# Patient Record
Sex: Male | Born: 1940 | Race: White | Hispanic: No | Marital: Married | Smoking: Former smoker
Health system: Southern US, Community
[De-identification: ages and names within clinical notes are randomized; demographics above are authoritative.]

## PROBLEM LIST (undated history)

## (undated) DIAGNOSIS — M1990 Unspecified osteoarthritis, unspecified site: Secondary | ICD-10-CM

## (undated) DIAGNOSIS — Z8709 Personal history of other diseases of the respiratory system: Secondary | ICD-10-CM

## (undated) DIAGNOSIS — K219 Gastro-esophageal reflux disease without esophagitis: Secondary | ICD-10-CM

## (undated) DIAGNOSIS — D649 Anemia, unspecified: Secondary | ICD-10-CM

## (undated) DIAGNOSIS — I70219 Atherosclerosis of native arteries of extremities with intermittent claudication, unspecified extremity: Secondary | ICD-10-CM

## (undated) DIAGNOSIS — J449 Chronic obstructive pulmonary disease, unspecified: Secondary | ICD-10-CM

## (undated) DIAGNOSIS — I10 Essential (primary) hypertension: Secondary | ICD-10-CM

## (undated) DIAGNOSIS — C4490 Unspecified malignant neoplasm of skin, unspecified: Secondary | ICD-10-CM

## (undated) DIAGNOSIS — M109 Gout, unspecified: Secondary | ICD-10-CM

## (undated) DIAGNOSIS — R51 Headache: Secondary | ICD-10-CM

## (undated) DIAGNOSIS — I2510 Atherosclerotic heart disease of native coronary artery without angina pectoris: Secondary | ICD-10-CM

## (undated) DIAGNOSIS — IMO0001 Reserved for inherently not codable concepts without codable children: Secondary | ICD-10-CM

## (undated) DIAGNOSIS — Z8719 Personal history of other diseases of the digestive system: Secondary | ICD-10-CM

## (undated) DIAGNOSIS — I219 Acute myocardial infarction, unspecified: Secondary | ICD-10-CM

## (undated) DIAGNOSIS — R2681 Unsteadiness on feet: Secondary | ICD-10-CM

## (undated) DIAGNOSIS — C679 Malignant neoplasm of bladder, unspecified: Secondary | ICD-10-CM

## (undated) DIAGNOSIS — M545 Low back pain: Secondary | ICD-10-CM

## (undated) DIAGNOSIS — I5042 Chronic combined systolic (congestive) and diastolic (congestive) heart failure: Secondary | ICD-10-CM

## (undated) DIAGNOSIS — Z8739 Personal history of other diseases of the musculoskeletal system and connective tissue: Secondary | ICD-10-CM

## (undated) DIAGNOSIS — I471 Supraventricular tachycardia, unspecified: Secondary | ICD-10-CM

## (undated) DIAGNOSIS — I499 Cardiac arrhythmia, unspecified: Secondary | ICD-10-CM

## (undated) DIAGNOSIS — M353 Polymyalgia rheumatica: Secondary | ICD-10-CM

## (undated) DIAGNOSIS — Z951 Presence of aortocoronary bypass graft: Secondary | ICD-10-CM

## (undated) DIAGNOSIS — R519 Headache, unspecified: Secondary | ICD-10-CM

## (undated) DIAGNOSIS — E119 Type 2 diabetes mellitus without complications: Secondary | ICD-10-CM

## (undated) DIAGNOSIS — K222 Esophageal obstruction: Secondary | ICD-10-CM

## (undated) DIAGNOSIS — Z789 Other specified health status: Secondary | ICD-10-CM

## (undated) DIAGNOSIS — E785 Hyperlipidemia, unspecified: Secondary | ICD-10-CM

## (undated) DIAGNOSIS — N312 Flaccid neuropathic bladder, not elsewhere classified: Secondary | ICD-10-CM

## (undated) DIAGNOSIS — N183 Chronic kidney disease, stage 3 unspecified: Secondary | ICD-10-CM

## (undated) DIAGNOSIS — G8929 Other chronic pain: Secondary | ICD-10-CM

## (undated) DIAGNOSIS — I209 Angina pectoris, unspecified: Secondary | ICD-10-CM

## (undated) DIAGNOSIS — G4733 Obstructive sleep apnea (adult) (pediatric): Secondary | ICD-10-CM

## (undated) DIAGNOSIS — I4891 Unspecified atrial fibrillation: Secondary | ICD-10-CM

## (undated) DIAGNOSIS — I6529 Occlusion and stenosis of unspecified carotid artery: Secondary | ICD-10-CM

## (undated) HISTORY — DX: Chronic combined systolic (congestive) and diastolic (congestive) heart failure: I50.42

## (undated) HISTORY — DX: Occlusion and stenosis of unspecified carotid artery: I65.29

## (undated) HISTORY — DX: Polymyalgia rheumatica: M35.3

## (undated) HISTORY — PX: MOHS SURGERY: SUR867

## (undated) HISTORY — DX: Unspecified atrial fibrillation: I48.91

## (undated) HISTORY — PX: TONSILLECTOMY: SUR1361

## (undated) HISTORY — PX: SKIN CANCER EXCISION: SHX779

## (undated) HISTORY — PX: MULTIPLE TOOTH EXTRACTIONS: SHX2053

## (undated) HISTORY — DX: Essential (primary) hypertension: I10

## (undated) HISTORY — PX: EYE SURGERY: SHX253

## (undated) HISTORY — DX: Chronic kidney disease, stage 3 unspecified: N18.30

## (undated) HISTORY — DX: Hyperlipidemia, unspecified: E78.5

## (undated) HISTORY — DX: Obstructive sleep apnea (adult) (pediatric): G47.33

## (undated) HISTORY — PX: FRACTURE SURGERY: SHX138

## (undated) HISTORY — PX: COLONOSCOPY: SHX174

## (undated) HISTORY — PX: ESOPHAGOGASTRODUODENOSCOPY (EGD) WITH ESOPHAGEAL DILATION: SHX5812

## (undated) HISTORY — PX: CORONARY STENT PLACEMENT: SHX1402

## (undated) HISTORY — PX: CARDIAC CATHETERIZATION: SHX172

## (undated) HISTORY — PX: BACK SURGERY: SHX140

## (undated) HISTORY — PX: ANTERIOR CERVICAL DECOMP/DISCECTOMY FUSION: SHX1161

## (undated) HISTORY — DX: Chronic kidney disease, stage 3 (moderate): N18.3

## (undated) HISTORY — PX: ANKLE FRACTURE SURGERY: SHX122

## (undated) HISTORY — DX: Atherosclerosis of native arteries of extremities with intermittent claudication, unspecified extremity: I70.219

## (undated) HISTORY — DX: Gastro-esophageal reflux disease without esophagitis: K21.9

---

## 1997-10-18 HISTORY — PX: CORONARY ARTERY BYPASS GRAFT: SHX141

## 1999-06-19 HISTORY — PX: CATARACT EXTRACTION W/ INTRAOCULAR LENS  IMPLANT, BILATERAL: SHX1307

## 1999-06-29 ENCOUNTER — Encounter: Payer: Self-pay | Admitting: *Deleted

## 1999-06-29 ENCOUNTER — Ambulatory Visit (HOSPITAL_COMMUNITY): Admission: RE | Admit: 1999-06-29 | Discharge: 1999-06-29 | Payer: Self-pay | Admitting: *Deleted

## 2000-04-19 ENCOUNTER — Ambulatory Visit (HOSPITAL_COMMUNITY): Admission: RE | Admit: 2000-04-19 | Discharge: 2000-04-19 | Payer: Self-pay | Admitting: Interventional Cardiology

## 2000-04-28 ENCOUNTER — Ambulatory Visit (HOSPITAL_COMMUNITY): Admission: RE | Admit: 2000-04-28 | Discharge: 2000-04-28 | Payer: Self-pay

## 2000-05-06 ENCOUNTER — Encounter: Admission: RE | Admit: 2000-05-06 | Discharge: 2000-05-06 | Payer: Self-pay | Admitting: Interventional Cardiology

## 2000-05-06 ENCOUNTER — Encounter: Payer: Self-pay | Admitting: Interventional Cardiology

## 2000-05-26 ENCOUNTER — Ambulatory Visit (HOSPITAL_COMMUNITY): Admission: RE | Admit: 2000-05-26 | Discharge: 2000-05-27 | Payer: Self-pay | Admitting: *Deleted

## 2001-09-05 ENCOUNTER — Encounter: Admission: RE | Admit: 2001-09-05 | Discharge: 2001-12-04 | Payer: Self-pay | Admitting: Family Medicine

## 2002-01-10 ENCOUNTER — Ambulatory Visit (HOSPITAL_COMMUNITY): Admission: RE | Admit: 2002-01-10 | Discharge: 2002-01-10 | Payer: Self-pay | Admitting: Family Medicine

## 2004-03-06 ENCOUNTER — Encounter: Admission: RE | Admit: 2004-03-06 | Discharge: 2004-03-06 | Payer: Self-pay | Admitting: Gastroenterology

## 2004-05-12 ENCOUNTER — Ambulatory Visit (HOSPITAL_COMMUNITY): Admission: RE | Admit: 2004-05-12 | Discharge: 2004-05-12 | Payer: Self-pay | Admitting: Family Medicine

## 2005-05-14 ENCOUNTER — Inpatient Hospital Stay (HOSPITAL_COMMUNITY): Admission: EM | Admit: 2005-05-14 | Discharge: 2005-05-21 | Payer: Self-pay | Admitting: *Deleted

## 2005-06-10 ENCOUNTER — Ambulatory Visit (HOSPITAL_COMMUNITY): Admission: RE | Admit: 2005-06-10 | Discharge: 2005-06-11 | Payer: Self-pay | Admitting: Interventional Cardiology

## 2005-07-08 ENCOUNTER — Encounter (HOSPITAL_COMMUNITY): Admission: RE | Admit: 2005-07-08 | Discharge: 2005-10-06 | Payer: Self-pay | Admitting: Interventional Cardiology

## 2005-10-07 ENCOUNTER — Encounter (HOSPITAL_COMMUNITY): Admission: RE | Admit: 2005-10-07 | Discharge: 2006-01-05 | Payer: Self-pay | Admitting: Interventional Cardiology

## 2006-01-11 ENCOUNTER — Inpatient Hospital Stay (HOSPITAL_COMMUNITY): Admission: EM | Admit: 2006-01-11 | Discharge: 2006-01-13 | Payer: Self-pay | Admitting: Emergency Medicine

## 2006-05-20 ENCOUNTER — Ambulatory Visit: Payer: Self-pay | Admitting: Internal Medicine

## 2006-10-28 ENCOUNTER — Ambulatory Visit (HOSPITAL_COMMUNITY): Admission: RE | Admit: 2006-10-28 | Discharge: 2006-10-28 | Payer: Self-pay | Admitting: Family Medicine

## 2008-04-24 ENCOUNTER — Inpatient Hospital Stay (HOSPITAL_COMMUNITY): Admission: EM | Admit: 2008-04-24 | Discharge: 2008-04-26 | Payer: Self-pay | Admitting: Emergency Medicine

## 2011-03-02 ENCOUNTER — Emergency Department (HOSPITAL_COMMUNITY): Payer: Medicare Other

## 2011-03-02 ENCOUNTER — Inpatient Hospital Stay (HOSPITAL_COMMUNITY)
Admission: EM | Admit: 2011-03-02 | Discharge: 2011-03-06 | DRG: 247 | Disposition: A | Discharge status: Home / Self Care | Payer: Medicare Other | Attending: Interventional Cardiology | Admitting: Interventional Cardiology

## 2011-03-02 DIAGNOSIS — I25810 Atherosclerosis of coronary artery bypass graft(s) without angina pectoris: Secondary | ICD-10-CM | POA: Diagnosis present

## 2011-03-02 DIAGNOSIS — I7092 Chronic total occlusion of artery of the extremities: Secondary | ICD-10-CM | POA: Diagnosis present

## 2011-03-02 DIAGNOSIS — I70209 Unspecified atherosclerosis of native arteries of extremities, unspecified extremity: Secondary | ICD-10-CM | POA: Diagnosis present

## 2011-03-02 DIAGNOSIS — I214 Non-ST elevation (NSTEMI) myocardial infarction: Principal | ICD-10-CM | POA: Diagnosis present

## 2011-03-02 DIAGNOSIS — I10 Essential (primary) hypertension: Secondary | ICD-10-CM | POA: Diagnosis present

## 2011-03-02 DIAGNOSIS — Y849 Medical procedure, unspecified as the cause of abnormal reaction of the patient, or of later complication, without mention of misadventure at the time of the procedure: Secondary | ICD-10-CM | POA: Diagnosis present

## 2011-03-02 DIAGNOSIS — E119 Type 2 diabetes mellitus without complications: Secondary | ICD-10-CM | POA: Diagnosis present

## 2011-03-02 DIAGNOSIS — E785 Hyperlipidemia, unspecified: Secondary | ICD-10-CM | POA: Diagnosis present

## 2011-03-02 DIAGNOSIS — T82897A Other specified complication of cardiac prosthetic devices, implants and grafts, initial encounter: Secondary | ICD-10-CM | POA: Diagnosis present

## 2011-03-02 LAB — GLUCOSE, CAPILLARY: Glucose-Capillary: 295 mg/dL — ABNORMAL HIGH (ref 70–99)

## 2011-03-02 LAB — URINALYSIS, ROUTINE W REFLEX MICROSCOPIC
Glucose, UA: 250 mg/dL — AB
Ketones, ur: NEGATIVE mg/dL
Protein, ur: 100 mg/dL — AB
Specific Gravity, Urine: 1.017 (ref 1.005–1.030)
Urobilinogen, UA: 0.2 mg/dL (ref 0.0–1.0)
pH: 5.5 (ref 5.0–8.0)

## 2011-03-02 LAB — HEPARIN LEVEL (UNFRACTIONATED): Heparin Unfractionated: 0.36 IU/mL (ref 0.30–0.70)

## 2011-03-02 LAB — CK TOTAL AND CKMB (NOT AT ARMC)
Relative Index: INVALID (ref 0.0–2.5)
Total CK: 54 U/L (ref 7–232)

## 2011-03-02 LAB — BASIC METABOLIC PANEL
BUN: 27 mg/dL — ABNORMAL HIGH (ref 6–23)
Chloride: 102 mEq/L (ref 96–112)
Creatinine, Ser: 1.33 mg/dL (ref 0.4–1.5)
GFR calc non Af Amer: 53 mL/min — ABNORMAL LOW (ref >60)
Glucose, Bld: 308 mg/dL — ABNORMAL HIGH (ref 70–99)
Potassium: 5.8 mEq/L — ABNORMAL HIGH (ref 3.5–5.1)

## 2011-03-02 LAB — HEMOGLOBIN A1C
Hgb A1c MFr Bld: 7.3 % — ABNORMAL HIGH (ref <5.7)
Mean Plasma Glucose: 163 mg/dL — ABNORMAL HIGH (ref <117)

## 2011-03-02 LAB — COMPREHENSIVE METABOLIC PANEL
ALT: 21 U/L (ref 0–53)
Alkaline Phosphatase: 90 U/L (ref 39–117)
Chloride: 101 mEq/L (ref 96–112)
Creatinine, Ser: 1.24 mg/dL (ref 0.4–1.5)
Glucose, Bld: 289 mg/dL — ABNORMAL HIGH (ref 70–99)
Potassium: 5.6 mEq/L — ABNORMAL HIGH (ref 3.5–5.1)
Sodium: 137 mEq/L (ref 135–145)
Total Bilirubin: 0.2 mg/dL — ABNORMAL LOW (ref 0.3–1.2)
Total Protein: 7.1 g/dL (ref 6.0–8.3)

## 2011-03-02 LAB — CARDIAC PANEL(CRET KIN+CKTOT+MB+TROPI)
CK, MB: 5.1 ng/mL — ABNORMAL HIGH (ref 0.3–4.0)
CK, MB: 4.9 ng/mL — ABNORMAL HIGH (ref 0.3–4.0)
Relative Index: INVALID (ref 0.0–2.5)
Total CK: 64 U/L (ref 7–232)
Troponin I: 0.99 ng/mL (ref <0.30)
Troponin I: 0.98 ng/mL (ref <0.30)

## 2011-03-02 LAB — DIFFERENTIAL
Basophils Absolute: 0 10*3/uL (ref 0.0–0.1)
Basophils Relative: 0 % (ref 0–1)
Lymphocytes Relative: 4 % — ABNORMAL LOW (ref 12–46)
Neutrophils Relative %: 93 % — ABNORMAL HIGH (ref 43–77)

## 2011-03-02 LAB — CBC
RBC: 5.31 MIL/uL (ref 4.22–5.81)
WBC: 14.9 10*3/uL — ABNORMAL HIGH (ref 4.0–10.5)

## 2011-03-02 LAB — URINE MICROSCOPIC-ADD ON

## 2011-03-02 NOTE — H&P (Signed)
NAMEXYLER, TERPENING NO.:  [id number]   MEDICAL RECORD NO.:  [id number]           PATIENT TYPE:   LOCATION:                                 FACILITY:   PHYSICIAN:  Lyn Records, M.D.   DATE OF BIRTH:  [birth year]   DATE OF ADMISSION:  DATE OF DISCHARGE:                              HISTORY & PHYSICAL   CHIEF COMPLAINT:  Chest pain.   [name] in a 70 year old male patient with known history of coronary  artery disease.  He underwent bypass surgery in 1999 with the following  grafts:  Seventh finger up to the second and third diagonal, status vein  graft to the acute marginal RCA-PDA-LV branch, LIMA to the LAD.  He had  a history of non-ST-segment elevated myocardial infarction in 2006 and  underwent angioplasty to the saphenous vein graft to the right coronary  artery.  In August 2006, he had a drug-eluting stent placed to the  circumflex lesion.  In March 2007, he had a drug-eluting stent to the  saphenous vein graft to the PDA.  Noted at that time was 60%-70%  stenosis in the saphenous vein graft to the diagonal with a LIMA to  diagonal anastomotic site.  This was medically managed.  Now, he comes  in having chest pressure indigestion like his regular angina.  This  started around 6:30 a.m. on the day of admission.  He tried 2 sublingual  nitroglycerin, but it was felt they were old but may be helped somewhat.  He then developed more pain, came to the emergency room and was placed  on IV nitroglycerin with poor relief of chest pain.  He did complain of  left arm tingling and some nausea.  His EKG shows normal sinus rhythm  with a right bundle branch block.   PAST MEDICAL HISTORY:  1. Diabetes mellitus,  2. Hypertension.  3. Coronary artery disease.  4. Long-term medication use.  5. Dyslipidemia.  6. Hypertension.  7. Former tobacco abuse.   SOCIAL HISTORY:  Quit smoking 4 years ago.  No alcohol or illicit drug  use.   FAMILY HISTORY:  Mom with a  known history of coronary artery disease.  Dad had a pacemaker and a defibrillator.   ALLERGIES:  No known drug allergies.   MEDICATIONS:  1. Enteric-coated aspirin 325 mg a day.  2. Glipizide 5 mg a day.  3. Metformin 1000 mg b.i.d.  4. Plavix 75 mg a day once daily.  5. Sublingual nitroglycerin p.r.n.  6. Over-the-counter antacid p.r.n.   PHYSICAL EXAMINATION:  VITALS:  Blood pressure 105/49, pulse 60,  respirations 18, and temp 97.8.  HEENT:  Grossly normal.  No carotid or subclavian bruits.  No JVD or  thyromegaly.  Sclerae clear.  Conjunctiva normal.  Nares without  drainage.  CHEST:  Clear to auscultation bilaterally.  No wheezing or rhonchi.  HEART:  Regular rate and rhythm.  No rubs, gallops, or murmur.  ABDOMEN:  Soft, nontender, and nondistended.  No mass.  No bruits.  No  lower extremity edema.  No femoral bruits.  NEURO:  Alert and oriented x3.  SKIN:  Warm and dry.   LABORATORY DATA:  Hemoglobin 14.4, hematocrit 41.7, platelets 329,000,  and white count 8.2.  Protime 12.7 and INR 0.9.  Point of care markers  negative x1.  Sodium 138, potassium 4.7, BUN 17, and creatinine 1.1.   CHEST X-RAY:  No active disease.   ASSESSMENT AND PLAN:  1. Typical chest pain.  2. Known coronary artery disease with history of bypass surgery as      mentioned above.  3. Diabetes mellitus:  Hold Glucophage.  4. Hypertension.  5. Dyslipidemia.  6. Long-term medication use.   We will place the patient on IV nitroglycerin, aspirin, and continue  home medications.  To the cath lab today about 1:30 p.m. in the care of  Dr. Verdis Prime.      [name], P.A.      Lyn Records, M.D.  Electronically Signed    LB/MEDQ  D:  04/26/2008  T:  04/27/2008  Job:  [id number]   cc:   Lyn Records, M.D.

## 2011-03-02 NOTE — Cardiovascular Report (Signed)
NAMEHUMZA, TALLERICO NO.:  [id number]   MEDICAL RECORD NO.:  [id number]          PATIENT TYPE:  INP   LOCATION:  2901                         FACILITY:  [facility]   PHYSICIAN:  Lyn Records, M.D.   DATE OF BIRTH:  [birth year]   DATE OF PROCEDURE:  04/24/2008  DATE OF DISCHARGE:                            CARDIAC CATHETERIZATION   INDICATION FOR PROCEDURE:  Acute coronary syndrome.   PROCEDURE PERFORMED:  1. Left heart catheterization.  2. Coronary angiography.  3. Hemodynamic recordings.  4. Left ventriculography.  5. Bypass graft angiography including internal mammary graft      angiography.  6. Complicated percutaneous coronary intervention on saphenous vein      graft to the right coronary for severe in-stent restenosis with      thrombotic occlusion requiring filter wire, Fetch aspiration      thrombectomy, intracoronary verapamil, and restenting with drug-      eluting stents in the region of in-stent restenosis.   DESCRIPTION:  After informed consent, the patient was brought to the  cath lab in the postabsorptive state.  A 6-French sheath was placed in  the right femoral artery using a modified Seldinger technique.  A 6-  [place] A2 multipurpose catheter was used for hemodynamic recordings,  left ventriculography by hand injection, selective bypass graft  angiography, and native coronary angiography.  We used a 6-French  internal mammary catheter for left internal mammary artery angiography.   We identified the culprit for the patient's presentation to be a region  of severe in-stent restenosis with Cypher stents placed in the mid-body  of the saphenous vein graft to the right coronary in 2007.  We noted  that the stents in the distal portion of the graft had this anastomosis  with the PDA were still patent.  There appeared to be a filling defect  distal to the mid vessel stent.   We used a right coronary bypass graft 6-French catheter to obtain  guiding shots of the right coronary graft.  The patient was given a  double bolus followed by an infusion of Integrilin and weight-based  heparin at 50 units per kg.  We documented ACT to be greater than 230.  We used initially a [place] guidewire to stabilize the guide catheter  and the saphenous vein graft.  We then distally placed a FilterWire EZ  distally in the right coronary.  We used 2 of these devices.  The first  device had some difficulty traversing the vessel and the bend that I  placed on the tip prevented [place] from traversing the distal most stent in  this graft.  The second device went down relatively easily without  difficulty through the second stent and had a relatively straight wire  tip.  We deployed the filter wire and then we performed angioplasty  using a 4.0 x 20-mm long balloon within the region of restenosis.  We  then placed a 3.5 x 28-mm long and a 3.5 x 12-mm long Promus stent in  the region of restenosis completely covering the previously placed  stent.  We then postdilated with  a 4.5 mm x 20-mm long Quantum balloon  to 14 atmospheres throughout the stented region.  We then removed the  filter wire which improved flow.  We then performed aspiration and  thrombectomy using the Fetch catheter and paid particular attention to  the vessel distal to the placed stents in the mid-graft.  We were able  to aspirate multiple fragments of yellow colored material from this  region.  The Fetch was used over a Forensic psychologist guidewire.  After the  thrombectomy runs, reduced flow was still noted.  We then used a Fetch  catheter to give intracoronary verapamil in aliquots of 100-150 mcg for  a total of 1000 mcg of verapamil.  This did slightly improve flow.  We  then removed the wire and took final angiographic pictures and the case  was terminated.   RESULTS:  I:  Hemodynamic data:  a.  Aortic pressure 124/15.  b.  Left ventricular pressure 124/7 mmHg.  1. Left ventriculography:   Left ventricular size and function are      normal.  EF is 65%.  No mitral regurgitation is noted.  2. Coronary angiography:      a.     Left main coronary widely patent.      b.     Left anterior descending coronary:  Totally occluded just       proximal to the first septal perforator.      c.     Circumflex artery:  Patent.  Proximal and mid circumflex       with a small obtuse marginal arising distally.  Circumflex stent       is patent.      d.     Right coronary:  Totally occluded in the midvessel.  3. Bypass graft angiography.      a.     Saphenous vein graft to the PDA:  This graft is patent, but       contains high-grade diffuse in-stent restenosis (type 3) with       reduced flow.  Beyond the midvessel stents, there is a globular       filling defect.  The distal stent at the anastomosis of the graft       with PDA is widely patent.      b.     Saphenous vein graft sequential to the diagonal and first       obtuse marginal:  Widely patent.  4. Internal mammary graft to the LAD:  Widely patent.  5. PCI:  A 99% diffuse in-stent restenosis in the saphenous vein graft      to the PDA was reduced to less than 10% after restenting with      Promus drug-eluting stent in an overlapping fashion.  TIMI flow was      2-3 despite selective administration of verapamil into the distal      graft via the Fetch catheter.   CONCLUSION:  1. ACS secondary to late restenosis of saphenous vein graft drug-      eluting stent that supply the right coronary.  2. Patent LIMA to the LAD and saphenous vein sequential graft to the      diagonal obtuse marginal.  3. Severe native vessel coronary disease with total occlusion of the      LAD, total occlusion of the mid-right coronary.  4. Normal LV function.  5. Successful PCI on the restenotic diffuse in-stent restenosis in the      right coronary saphenous vein  graft and aspiration thrombectomy on      the distal graft beyond the region of restenosis  with a beautiful      angiographic result with less than 10% stenosis, but with      diminished flow secondary to microembolization.   PLAN:  IV Integrilin, IV nitroglycerin.  Plavix to be continued.  Heparin will be discontinued.      Lyn Records, M.D.  Electronically Signed     HWS/MEDQ  D:  04/24/2008  T:  04/25/2008  Job:  [id number]   cc:   Holley Bouche, M.D.

## 2011-03-02 NOTE — Discharge Summary (Signed)
[name] NO.:  [id number]   MEDICAL RECORD NO.:  [id number]          PATIENT TYPE:  INP   LOCATION:  2002                         FACILITY:  [facility]   PHYSICIAN:  Lyn Records, M.D.   DATE OF BIRTH:  1940-11-18   DATE OF ADMISSION:  04/24/2008  DATE OF DISCHARGE:                               DISCHARGE SUMMARY   DISCHARGE DIAGNOSES:  1. Non-ST segment elevated myocardial infarction.  2. Coronary artery disease.  3. Diabetes mellitus.  4. Hypertension.  5. Dyslipidemia.  6. Long-term medication use.   [name] is a 70 year old male patient with known coronary artery  disease.  He had bypass surgery in 1999 with the following grafts:  Saphenous vein graft to the second and third diagonal, saphenous vein  graft to the acute marginal - PDA - LV branch, LIMA to LAD.  He has had  multiple interventions since that time and in 2006 underwent angioplasty  to the saphenous vein graft to the right coronary artery.  He then had a  drug-eluting stent placed to the circumflex during the same year.  He  also had a drug-eluting stent placed to the saphenous vein graft to PDA  in March 2007.  Noted at that time was a 60-70% stenosis in the  saphenous vein graft to the diagonal between the two diagonal  anastomotic sites.  This was medically managed.   He was admitted on April 24, 2008 with substernal chest pain.  He had full  resolution of symptoms with IV nitroglycerin.   Lab work during this hospital stay showed a maximum troponin of 2.53  with CK of 174, MB fraction of 22.0.  Other lab work included a  hemoglobin of 12.8, hematocrit 37.3, white count 8.1, platelets 304,  sodium 140, potassium 4.5, BUN 11, creatinine 1.22.  LFTs normal.  Chest  x-ray, no acute findings.  EKG, normal sinus rhythm with a right bundle  branch block.   The patient then underwent cardiac catheterization and was found to have  restenosis in the saphenous vein graft to the  PDA, a drug-eluting stent  was implanted.  On April 26, 2008, the patient was felt ready for  discharge to home.  It was felt that the patient needed to remain on  Plavix full life.   DISCHARGE MEDICATIONS:  1. Plavix 75 mg a day.  2. Enteric-coated aspirin 325 mg a day.  3. Sublingual nitroglycerin p.r.n. chest pain.  4. Metformin to be resumed on April 28, 2008.  5. Glipizide 5 mg a day.  6. Exforge daily.  7. Omeprazole daily.  8. Calcium daily.  9. Vitamin D daily.   The patient is to clean cath site gently with soap and water.  Remain on  a low-sodium, heart-healthy diabetic diet.  Increase activity slowly.  No lifting over 10 pounds for 1 week.  No driving for 2 days.  He can  follow up with Dr. Katrinka Blazing Tillman Sers, nurse practitioner on May 13, 2008, at 1:40 p.m.      Guy Franco, P.A.      Lyn Records,  M.D.  Electronically Signed    LB/MEDQ  D:  04/26/2008  T:  04/26/2008  Job:  [id number]   cc:   Lyn Records, M.D.

## 2011-03-03 LAB — CBC
HCT: 38.9 % — ABNORMAL LOW (ref 39.0–52.0)
Hemoglobin: 13 g/dL (ref 13.0–17.0)
Platelets: 293 10*3/uL (ref 150–400)
RBC: 4.77 MIL/uL (ref 4.22–5.81)

## 2011-03-03 LAB — GLUCOSE, CAPILLARY
Glucose-Capillary: 290 mg/dL — ABNORMAL HIGH (ref 70–99)
Glucose-Capillary: 230 mg/dL — ABNORMAL HIGH (ref 70–99)
Glucose-Capillary: 388 mg/dL — ABNORMAL HIGH (ref 70–99)

## 2011-03-03 LAB — BASIC METABOLIC PANEL
CO2: 23 mEq/L (ref 19–32)
Calcium: 9.4 mg/dL (ref 8.4–10.5)
Creatinine, Ser: 1.21 mg/dL (ref 0.4–1.5)
GFR calc Af Amer: >60 mL/min (ref >60)
GFR calc non Af Amer: 59 mL/min — ABNORMAL LOW (ref >60)
Glucose, Bld: 318 mg/dL — ABNORMAL HIGH (ref 70–99)
Sodium: 135 mEq/L (ref 135–145)

## 2011-03-03 LAB — CARDIAC PANEL(CRET KIN+CKTOT+MB+TROPI)
Relative Index: INVALID (ref 0.0–2.5)
Total CK: 50 U/L (ref 7–232)
Troponin I: 0.93 ng/mL (ref <0.30)

## 2011-03-03 LAB — LIPID PANEL
Triglycerides: 134 mg/dL (ref <150)
VLDL: 27 mg/dL (ref 0–40)

## 2011-03-03 LAB — POTASSIUM: Potassium: 4.8 mEq/L (ref 3.5–5.1)

## 2011-03-04 LAB — CARDIAC PANEL(CRET KIN+CKTOT+MB+TROPI)
CK, MB: 52.1 ng/mL (ref 0.3–4.0)
CK, MB: 69.1 ng/mL (ref 0.3–4.0)
Relative Index: 10.1 — ABNORMAL HIGH (ref 0.0–2.5)
Relative Index: 8.2 — ABNORMAL HIGH (ref 0.0–2.5)
Total CK: 632 U/L — ABNORMAL HIGH (ref 7–232)
Total CK: 684 U/L — ABNORMAL HIGH (ref 7–232)
Troponin I: >25 ng/mL (ref <0.30)

## 2011-03-04 LAB — GLUCOSE, CAPILLARY
Glucose-Capillary: 182 mg/dL — ABNORMAL HIGH (ref 70–99)
Glucose-Capillary: 187 mg/dL — ABNORMAL HIGH (ref 70–99)

## 2011-03-04 LAB — BASIC METABOLIC PANEL
CO2: 27 mEq/L (ref 19–32)
Chloride: 102 mEq/L (ref 96–112)
Creatinine, Ser: 1.17 mg/dL (ref 0.4–1.5)
GFR calc Af Amer: >60 mL/min (ref >60)
Potassium: 4.8 mEq/L (ref 3.5–5.1)
Sodium: 138 mEq/L (ref 135–145)

## 2011-03-04 LAB — CBC
Hemoglobin: 12.9 g/dL — ABNORMAL LOW (ref 13.0–17.0)
Platelets: 320 10*3/uL (ref 150–400)
RBC: 4.67 MIL/uL (ref 4.22–5.81)
WBC: 19.8 10*3/uL — ABNORMAL HIGH (ref 4.0–10.5)

## 2011-03-05 LAB — BASIC METABOLIC PANEL WITH GFR
BUN: 27 mg/dL — ABNORMAL HIGH (ref 6–23)
CO2: 28 meq/L (ref 19–32)
Calcium: 9.4 mg/dL (ref 8.4–10.5)
Chloride: 102 meq/L (ref 96–112)
Creatinine, Ser: 1.07 mg/dL (ref 0.4–1.5)
GFR calc non Af Amer: >60 mL/min
Glucose, Bld: 157 mg/dL — ABNORMAL HIGH (ref 70–99)
Potassium: 4.1 meq/L (ref 3.5–5.1)
Sodium: 137 meq/L (ref 135–145)

## 2011-03-05 LAB — CARDIAC PANEL(CRET KIN+CKTOT+MB+TROPI)
Relative Index: 9.6 — ABNORMAL HIGH (ref 0.0–2.5)
Troponin I: 15.3 ng/mL (ref <0.30)

## 2011-03-05 LAB — GLUCOSE, CAPILLARY
Glucose-Capillary: 281 mg/dL — ABNORMAL HIGH (ref 70–99)
Glucose-Capillary: 217 mg/dL — ABNORMAL HIGH (ref 70–99)

## 2011-03-05 NOTE — Discharge Summary (Signed)
NAMESHANDON, BURLINGAME NO.:  [id number]   MEDICAL RECORD NO.:  [id number]          PATIENT TYPE:  INP   LOCATION:  3740                         FACILITY:  [facility]   PHYSICIAN:  Lyn Records, M.D.   DATE OF BIRTH:  Mar 16, 1941   DATE OF ADMISSION:  05/14/2005  DATE OF DISCHARGE:  05/21/2005                                 DISCHARGE SUMMARY   ADMITTING PHYSICIAN:  Lady Deutscher, MD.   PRIMARY CARDIOLOGIST:  Verdis Prime, MD.   PRIMARY CARE PHYSICIAN:  Holley Bouche, MD   CHIEF COMPLAINT AND REASON FOR ADMISSION:  Mr. Sava is a 70 year old  white male patient with history of CAD status post CABG in 1999, who  essentially had no cardiac symptoms until 24 hours prior to admission.  He  presented to the ER with intermittent chest pain for 24 hours.  Chest pain  started in the morning prior to admission around 8 a.m., initially sharp  with sternal pressure.  The patient reports belching all morning and after a  couple hours of his symptoms, his chest pain was totally relieved.  The  symptoms returned briefly on the evening of admission, resolved  spontaneously.  He went to bed about 9 p.m.  He woke up at midnight with  severe chest pain and called 911.  By the time EMS arrived, he was  diaphoretic, short of breath, and having nausea and vomiting.  He was given  sublingual nitroglycerin with improvement in his symptoms and by the time he  arrived to the ER, he was started on IV nitroglycerin and by the time Dr.  Reyes Ivan evaluated him, he was chest pain-free.  His physical exam was  unremarkable.  BP was 150/90, heart rate 72.  Initial cardiac enzymes showed  troponin I of 0.49, subsequently up to 0.68, myoglobin of 85, then  increasing to 111.  Chest x-ray was normal.  EKG sinus rhythm without ST  segment changes and underlying right bundle branch block.  Dr. Reyes Ivan  admitted this patient with the following diagnoses:   DIAGNOSES:  1.  Non-ST segment elevated  myocardial infarction.  2.  History of coronary artery disease and prior coronary artery bypass      grafting in 1999.  3.  Diabetes.  4.  Hypertension.  5.  Peripheral vascular disease.  6.  Gastroesophageal reflux disease.  7.  History of tobacco use.   HOSPITAL COURSE:  Problem 1. Non-ST segment elevated myocardial infarction.  The patient was admitted to the CCU, where he was started on IV heparin, IV  nitroglycerin and Integrilin with further monitoring of cardiac isoenzymes.  Troponin I peaked at 4.70 with a MB peak of 20.8.  The patient remained  chest pain-free throughout the weekend and plans were to proceed with  diagnostic coronary angiography on May 17, 2005.  The patient was taken to  the cath lab by Dr. Katrinka Blazing on that date, where he was found to have severe  diffuse disease of the native LAD, as well as native circumflex and RCA was  also severely diseased with 100% proximal occlusion.  Bypass  angiography  revealed patent sequential SVG to diagonal 1 and diagonal 2.  SVG to PDA was  very degenerated with multiple areas of greater than 90% occlusion.  At this  time, Dr. Katrinka Blazing was not sure if he would be able to treat this with PCI  alone.  LV-gram showed an EF of 55%, but inferobasilar hypokinesis.  Dr.  Katrinka Blazing planned to review the films and determine whether just medical  treatment alone was indicated versus possible PCI on the SVG to the PDA.  He  was also found to have severe native disease greater than 90% and the  circumflex after the OM1 and this supplies collaterals to the PL OM and PDA,  so there was some concern that he may need to stent the circumflex as well.  The patient was continued on heparin and Integrilin post cath.   The following morning, the patient had about 20-30 minutes of chest pressure  without dyspnea.  Vital signs were stable.  Creatinine was stable post cath  at 1.4.  Due to recurrence of chest pain and the fact that the distal vessel  is not a  reasonable CABG target, Dr. Katrinka Blazing opted to proceed with PCI of the  RCA graft noting that he was high-risk for ischemic complications secondary  to embolization and possible occlusion or extension of the __________ .  Unfortunately, this was the only choice available due to patient's recurrent  issue and multiplicity of disease areas.  This was discussed with the  patient and family and they wished to proceed.   On May 19, 2005,  the patient was scheduled to undergo PCI on RCA graft.  He did spike a temperature during the evening of 101.1.  Urine and blood  cultures were obtained and patient was empirically started on Rocephin IV.  Dr. Katrinka Blazing was not clear if fever was due to underlying infection such as  bronchitis, versus due to his MI and ischemia, versus due to contrast  reaction.   Despite having the fever, Dr. Katrinka Blazing felt the patient was at least  appropriate to undergo PCI.  He was taken to the cath lab on the afternoon  of May 19, 2005, where he had a complicated PTCA done to the SVG to the  RCA and the mid distal and distal anastomosis regions with multiple balloon  inflations to achieve expectations of appropriate flow (please refer to the  cath note for details).  Post cath, he was continued on Integrilin for 12  hours, continued back on aspirin and Plavix, as well as his prior  medications of beta-blockers, ARBs.  It is important to note that prior to  admission, the patient's Glucophage had been placed on hold and has been  held throughout the course of the hospitalization.   The patient on the morning of May 20, 2005 in the past 24 hours has had a  temperature max of 101.6.  He was continued on IV Rocephin.  Because of  additional disease of the circumflex found on the initial catheterization,  the patient's IV nitroglycerin was discontinued, but Imdur was added in and plans were to discharge patient home on May 21, 2005 if no problems.   On May 21, 2005, the  patient was without chest pain, no dyspnea, no cough,  no evidence of infection.  He had no further elevation in temperature,  although he did have rhonchi on examination.  Dr. Katrinka Blazing recommended the  patient receive an additional dose of Rocephin before discharge, but  otherwise no indication to continue antibiotics at discharge.  Chest x-ray  was repeated and showed an improved atelectasis, no active process.  Plans  were to discharge patient home on previous medications including the  addition of beta-blocker Imdur, Plavix and increased dose of aspirin and  plans to return at a later date for PCI of the circumflex.   FINAL DISCHARGE DIAGNOSES:  1.  Non-ST segment elevated myocardial infarction.  2.  Multivessel coronary artery disease with graft failure of the right      coronary artery graft status post complex percutaneous coronary      intervention.  3.  Residual disease of the native mid circumflex, repeat percutaneous      coronary intervention pending.  4.  Decreased left ventricular function with inferior basilar hypokinesis,      but with ejection fraction of 55%.  5.  Diabetes mellitus.  6.  History of tobacco dependence.  7.  Peripheral vascular disease.  8.  Prior history of coronary artery bypass grafting in 1999.  9.  Obesity.   LABORATORIES DURING HOSPITALIZATION:  Most recent BMET on August 1 showed a  sodium of 135, potassium 4.1, chloride 100, CO2 25, glucose 155, BUN 8,  creatinine 1.3.  CBC on May 20, 2005:  White count 11,900, hemoglobin  12.6, hematocrit 36.5, platelet count 233,000.  Lipid profile:  Cholesterol  134, triglycerides 222, HDL cholesterol 31, LDL cholesterol 59.  Hemoglobin  A1C at 8.2.   DISCHARGE MEDICATIONS:  1.  Diovan 160 mg daily.  2.  Prilosec 20 mg daily.  3.  Aspirin 325 mg daily.  4.  Plavix 75 mg daily.  5.  Imdur 60 mg daily.  6.  Lopressor 50 mg b.i.d.  7.  Metformin ER 500 mg two tablets b.i.d., resume May 23, 2005.    DIET:  Heart healthy diabetic precautions.   ACTIVITY RESTRICTIONS:  Increase activity slowly.  You may return to work on  May 31, 2005.   FOLLOWUP APPOINTMENTS:  He is to see Dr. Katrinka Blazing on August 11 at 10:30 a.m. to  discuss returning to the hospital for PCI on the circumflex vessel.      Allison L. Rennis Harding, N.P.      Lyn Records, M.D.  Electronically Signed    ALE/MEDQ  D:  05/21/2005  T:  05/21/2005  Job:  [id number]   cc:   Holley Bouche, M.D.  [address]  [place], [place] [address]  Fax: [phone]

## 2011-03-05 NOTE — Discharge Summary (Signed)
[name], [name] NO.:  [id number]   MEDICAL RECORD NO.:  [id number]          PATIENT TYPE:  OIB   LOCATION:  6525                         FACILITY:  [facility]   PHYSICIAN:  Lyn Records, M.D.   DATE OF BIRTH:  1940-11-24   DATE OF ADMISSION:  06/10/2005  DATE OF DISCHARGE:  06/11/2005                                 DISCHARGE SUMMARY   CONSULTATIONS:  Dr. [name] with peripheral vascular CVTS.   PRIMARY CARE PHYSICIAN:  Holley Bouche, M.D.   CHIEF COMPLAINT AND REASON FOR ADMISSION:  Mr. Helbing is a 70 year old male  recently hospitalized late July early August of this year for non-ST-segment  elevated myocardial infarction.  During that hospitalization, the patient  underwent coronary angiography including angiography of patient's bypass  graft.  He had a history of coronary artery bypass graft in 1999.  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  During the hospitalization, he underwent PCI of the RCA with  plans to return for intervention on the circumflex.   The patient was evaluated at the office by Dr. Katrinka Blazing on May 28, 2005.  He  has not had any angina since being discharged from the hospital.  He has  been complaining of being fatigued.  He had a moderate hematoma in the right  groin post cath during the hospitalization.  This is improving according to  Dr. Michaelle Copas notes. He has not had any claudication in either leg.  No  chills, no fever.  He feels that he is doing rather well considering the  circumstances.  At the visit on August 11, Dr. Katrinka Blazing had a long discussion  with the patient and they have planned to have the patient come in on  today's date to relook at the right coronary graft to determine if the stent  needs to be placed in the distal anastomosis of the region of the graft that  goes to the PDA since there is a  significant amount of recoil there.  Therefore, the patient was admitted on the above stated date for possible  intervention on the RCA as well as possible intervention on the circumflex  vessel.   ADMISSION DIAGNOSES:  1.  Recent non-ST-segment elevated myocardial infarction May 14, 2005.  2.  Multivessel coronary artery disease and graft failure of the right      coronary artery graft status post percutaneous coronary intervention.  3.  Residual disease native mid circumflex.  4.  Decreased left ventricular systolic function with inferior basilar      hypokinesis.  Ejection fraction 55%.  5.  Diabetes mellitus on Glucophage.  6.  History of tobacco use.  7.  Peripheral vascular disease.  8.  Prior history of coronary artery bypass graft in 1999.   HOSPITAL COURSE:  Coronary artery disease in need of recurrent PCI.  The  patient was admitted via short stay.  He was taken to the cath lab on June 10, 2005 per Dr. Katrinka Blazing  at which time he was found to have a high grade  circumflex lesion.  A drug-eluting stent, Taxus brand, was deployed to the  circumflex vessel per Dr. Katrinka Blazing.  Please refer to the cath report for  specifics.  Angiography of the saphenous vein graft to the PDA showed that  this graft was patent, but severely diseased with recoil in the distal  graft.  The patient was successfully angio-sealed and was restarted on  aspirin and Plavix post cath.  During the procedure, the patient had been  experiencing right thigh pain, but this resolved before the sheath was  pulled out.  On the morning of discharge, Dr. Katrinka Blazing noted several small sore  spots on the soles of the patient's right foot, and on exam he was noted to  have diminished pulses in both feet.  Dr. Katrinka Blazing felt that the small  ecchymotic areas on the right sole of the foot were probably embolic  lesions, and therefore Dr. [name] with CVTS was consulted.  It was determined  that the patient did have atheroembolic event  to the right foot.  Pulses  were present in the dorsalis pedis bilaterally, not in the posterior  tibialis.  Dr. [name] felt it was appropriate to discharge the patient home  with pain control management.  Recommended started narcotic pain  medicine.  The patient is to follow up with Dr. [name] in his office in four to six  weeks for ABIs.  His office will call with an appointment.   FINAL DISCHARGE DIAGNOSES:  1.  Recent non-ST-segment elevated myocardial infarction in July 2006.  2.  Previous percutaneous coronary intervention to the right coronary artery      graft last hospitalization.  3.  High grade circumflex lesion with Taxus stent implantation to the      circumflex this hospitalization.  4.  Residual disease in the saphenous vein graft to the posterior descending      artery.  5.  Microemboli to the right foot post cath.  6.  Decreased left ventricular function with inferior basilar hypokinesis,      ejection fraction 55%.  7.  Diabetes mellitus on Glucophage.  8.  Peripheral vascular disease as stated above.  9.  History of tobacco dependence.  10. Prior history of coronary artery bypass grafting in 1999.   DISCHARGE MEDICATIONS:  1.  Vicodin 5/500 one to two tablets every six hours as needed for pain.  2.  Diovan 160 mg daily.  3.  Prilosec 20 mg daily.  4.  Aspirin 325 mg daily.  5.  Plavix 75 mg daily.  6.  Imdur 60 mg daily.  7.  Lopressor 50 mg b.i.d.  8.  Glipizide 5 mg every morning.  9.  Simvastatin 40 mg at bedtime.  10. Metformin 500 mg tablets two tabs b.i.d.   The patient has been instructed not to take this medication until June 13, 2005.   DIET:  Heart healthy with diabetic restrictions.   ACTIVITY:  Increase activity slowly.  May shower only next two days.  No  lifting more than 10 pounds for the next two days.   WOUND CARE:  Call if any bruising or swelling in the right groin.  OTHER INSTRUCTIONS:  Need to notify Dr. Florina Ou office if increased  pain in  the right foot or leg area.   FOLLOWUP APPOINTMENTS:  He is to see Dr. Katrinka Blazing on Monday, September 11 at  2:30 p.m.  He is to follow up  with Dr. [name] in his office in four to six  weeks.  Dr. Florina Ou office will call the patient with an appointment time.      Allison L. Rennis Harding, N.P.      Lyn Records, M.D.  Electronically Signed    ALE/MEDQ  D:  06/11/2005  T:  06/11/2005  Job:  [id number]   cc:   Balinda Quails, M.D.  [address]  [place]  [place] [address]   Holley Bouche, M.D.  [address]  [place], [place] [address]  Fax: [phone]

## 2011-03-05 NOTE — Discharge Summary (Signed)
[facility]  Patient:    [name], [name]                     MRN: [id number] Adm. Date:  [id number] Disc. Date: [id number] Attending:  Melvenia Needles Dictator:   Maple Mirza, P.A.                           Discharge Summary  DATE OF BIRTH:  04/09/1941  FINAL DIAGNOSIS:  Bilateral lower extremity claudication.  PROCEDURE:  Aortogram with bilateral runoff with stenting of the left external iliac artery and percutaneous transluminal angioplasty, no complications.  He will go home on Plavix and aspirin for a 30-day period and then switch totally to aspirin.  FOLLOWUP:  He will have a 30-day followup with Dr. [name].   Dr. [name] office will call to make that appointment.  DISCHARGE MEDICATIONS: 1. Enteric-coated aspirin 81 mg daily for 30 days. 2. Plavix 75 mg daily for 30 days, switched to enteric-coated aspirin    325 mg daily on September 10. 3. Resume Prevacid as before this procedure.  ACTIVITY:  Ambulation as tolerated.  DIET:  Low-sodium, low-cholesterol diet.  HOSPITAL COURSE:  He tolerated the surgical procedure without evidence of hematoma or excessive pain in the groin.  He is ambulating after his enforced rest period and should do well postoperatively. DD:  05/26/00 TD:  05/28/00 Job: [id number]

## 2011-03-05 NOTE — Discharge Summary (Signed)
NAMEOLAMIDE, CARATTINI NO.:  [id number]   MEDICAL RECORD NO.:  [id number]          PATIENT TYPE:  INP   LOCATION:  2002                         FACILITY:  [facility]   PHYSICIAN:  Guy Franco, P.A.       DATE OF BIRTH:  Nov 21, 1940   DATE OF ADMISSION:  04/24/2008  DATE OF DISCHARGE:  04/26/2008                               DISCHARGE SUMMARY   ADDENDUM   There is a question of whether or not the patient is on a beta-blocker.  He did not come in on beta-blocker, and for these reasons, it is unclear  to me why this will need to be reviewed in his office chart.  Of note,  however, while he was in the hospital, he did have bradycardia with  heart rates as low as 54, and therefore, beta-blocker was not started.      Guy Franco, P.A.     LB/MEDQ  D:  04/30/2008  T:  05/01/2008  Job:  [id number]

## 2011-03-05 NOTE — Discharge Summary (Signed)
[facility]  Patient:    [name], [name]                     MRN: [id number] Adm. Date:  [id number] Disc. Date: [id number] Attending:  Melvenia Needles Dictator:   Janeece Riggers, P.A.                           Discharge Summary  ADDENDUM  DATE OF BIRTH:  1941/09/27  On the day of procedure, after the procedure was finished, after the left external iliac artery had angioplasty and stenting, ankle-brachial indices were obtained; on the left, they were 0.41.  Prior to this procedure on July 2, they were 0.37.  On the right, they were 0.42 on August 9.  Prior to the procedure, they were 0.53 on July 2. DD:  05/26/00 TD:  05/28/00 Job: [id number]

## 2011-03-05 NOTE — H&P (Signed)
NAMEPRABHAV, [name] NO.:  [id number]   MEDICAL RECORD NO.:  [id number]          PATIENT TYPE:  INP   LOCATION:  4705                         FACILITY:  [facility]   PHYSICIAN:  Ulyses Amor, MD DATE OF BIRTH:  [birth year]   DATE OF ADMISSION:  01/11/2006  DATE OF DISCHARGE:                                HISTORY & PHYSICAL   CONTINUATION:   IMPRESSION:  1.  Chest pain; rule out unstable angina.  2.  Coronary artery disease, status post myocardial infarction and coronary      artery bypass surgery in 1999.  Status post non-ST segment elevation      acute myocardial infarction in 05/2005.  Cardiac catheterization      demonstrated failure of the graft to the right coronary artery as well      as progressive native circumflex disease.  Status post percutaneous      coronary intervention of right coronary artery and circumflex.  The      latter procedure was complicated by emboli to the feet.  3.  Peripheral vascular disease.  4.  Diabetes mellitus.  5.  Hypertension.  6.  Gastroesophageal reflux.  7.  Dyslipidemia.   PLAN:  1.  Telemetry.  2.  Serial cardiac enzymes.  3.  Aspirin.  4.  Intravenous heparin.  5.  Intravenous nitroglycerin.  6.  Continue metoprolol.  7.  Further measures per Dr. Katrinka Blazing.      Ulyses Amor, MD  Electronically Signed     MSC/MEDQ  D:  01/12/2006  T:  01/12/2006  Job:  [id number]   cc:   Lyn Records, M.D.  Fax: [phone]

## 2011-03-05 NOTE — Cardiovascular Report (Signed)
[name], [name] NO.:  [id number]   MEDICAL RECORD NO.:  [id number]          PATIENT TYPE:  INP   LOCATION:  2930                         FACILITY:  [facility]   PHYSICIAN:  Lyn Records, M.D.   DATE OF BIRTH:  Nov 07, 1940   DATE OF PROCEDURE:  05/17/2005  DATE OF DISCHARGE:                              CARDIAC CATHETERIZATION   INDICATIONS FOR PROCEDURE:  Non-ST elevation myocardial infarction on May 14, 2005, in this patient with a history of coronary artery disease and  coronary artery bypass grafting in 1999.  At that time, he received a left  internal mammary graft to the distal left anterior descending.  A sequential  saphenous vein graft at the second and third diagonal branches of the left  anterior descending, a sequential saphenous vein graft to the acute  marginal, posterior descending and posterior lateral branch of the right  coronary.  The circumflex was not grafted.  Because the distal circumflex  was not felt to be graftable.  There was also not significant high-grade  disease.   PROCEDURE PERFORMED:  1.  Left heart catheterization.  2. Selective coronary angiography.  3. Left      ventriculography.  4. Saphenous vein graft angiography.  5. Left      internal mammary graft angiography.   DESCRIPTION OF PROCEDURE:  After informed consent, a 6-French sheath was  placed in the right femoral artery using the modified Seldinger technique.  A 6-French A2 multipurpose catheter was used for hemodynamic recordings,  left ventriculography by hand injections, selective left and right native  coronary angiography as well as saphenous vein graft angiography.  We used  an internal mammary catheter for a left internal mammary artery angiography.  The patient tolerated the procedure without complications.   RESULTS:  1.  Hemodynamic data:      1.  Aortic pressure 121/58.      2.  Left ventricular pressure 120/18 mmHg.  2.  Left ventriculography.  The  left ventricle is normal in size.  The      inferobasilar region is severely hypokinetic.  No mitral regurgitation.      Ejection fraction is 60%.  3.  Coronary angiography.      1.  Left main coronary.  There is distal 30% left main stenosis.      2.  Left anterior descending coronary.  The left anterior descending          coronary artery contains a high-grade obstruction and severe diffuse          disease throughout the entire mid-segment after the origin of the          first diagonal.  The second diagonal was totally occluded.  There          was mild competitive flow noted in the distal LAD near where the          mammary to the LAD is inserted into the distal LAD.  The first          diagonal is widely patent.      3.  Circumflex  artery.  The circumflex coronary artery gives origin to          two obtuse marginal branches.  The first is moderate in size diffuse          disease in the mid-segment.  The circumflex beyond the first obtuse          marginal contains a 90+ percent stenosis.  The distal circumflex          gives origin to several small obtuse marginal branches on the          inferior wall as well as the left atrial recurrent branch which          collateralizes the distal right coronary.      4.  Right coronary.  The right coronary is large but totally occluded          proximally.  After the first acute marginal branch.  4.  Bypass graft angiography.      1.  Saphenous vein graft sequential to the acute marginal branch of the          PDA and the lateral branch of the right coronary.  This graft is          severely degenerated and functionally totally occluded.  There is a          high-grade 99% stenosis in the mid-body of the graft beyond which          there is TIMI grade 2 flow.  There is a 90% distal stenosis followed          by a 99% distal anastomosis stenosis at the site of the small PDA.          The proximal and mid-vessel contains irregularities with up  to 50%          narrowing in several spots.  The continuation of the graft of the LV          branch is occluded.      2.  Saphenous vein graft sequential to the second and third diagonals.          This graft is patent.  The graft between the second and third          diagonal branches contains a 50% diffuse narrowing.  5.  LIMA to LAD.  The LIMA is widely patent.  There is some ostial narrowing      of the LIMA.  Pressure dampening occurs with engagement.  The graft      supplies the distal LAD and there was some retrograde filling of the LAD      proximal to the graft insertion site.  The graft itself is widely      patent.   CONCLUSIONS:  1.  Acute coronary syndrome due to degeneration and thrombosis of the      saphenous vein graft to the right coronary, sequential graft to the      acute marginal/PDA/LV branch.  The anastomoses to the acute marginal and      LV branch is totally occluded and there is subtotal occlusion to the PDA      with only TIMI grade 2 noted.  The mid body and the distal portion of      the graft is severely degenerated and a distal protection device is      probably not feasible.  2.  Moderate disease of the saphenous vein graft to the diagonals.  3.  Patent LIMA to  the LAD.  4.  Severe native coronary artery disease with total occlusion of the right      coronary, diffuse disease of the first obtuse marginal, high-grade      obstruction in the circumflex after the first obtuse marginal, essential      total occlusion of the entire mid-segment of the LAD and total occlusion      of the second and third diagonal branches.  5.  Mild LV dysfunction with inferior hypokinesis.   PLAN:  Review cines.  Consider stenting the native circumflex as this  applies some collaterals to the distal right coronary territory, review of  colleagues, the advisability of even attempting PCI on the saphenous vein  graft to the right.  Continue antithrombotic therapy for the  time being.  We will discuss with  the patient and his family treatment options.  It is also conceivable that  medical therapy would be the treatment of choice.  I do not believe that  repeat bypass surgery is clinically indicated at this time because the  distal targets are not really acceptable targets.      Lyn Records, M.D.  Electronically Signed     HWS/MEDQ  D:  05/17/2005  T:  05/17/2005  Job:  [id number]   cc:   Holley Bouche, M.D.  [address]  [place], [place] [address]  Fax: [phone]   [facility]   Evelene Croon, M.D.  [address]  [place]  [place] [address]  Fax: [phone]   Dr. Bud Face

## 2011-03-05 NOTE — Cardiovascular Report (Signed)
[name], [name] NO.:  [id number]  MEDICAL RECORD NO.:  [id number]           PATIENT TYPE:  I  LOCATION:  2914                         FACILITY:  [facility]  PHYSICIAN:  Corky Crafts, MDDATE OF BIRTH:  August 03, 1941  DATE OF PROCEDURE: DATE OF DISCHARGE:                           CARDIAC CATHETERIZATION   PRIMARY CARDIOLOGIST:  Verdis Prime, MD  PROCEDURES PERFORMED:  Left heart catheterization, left ventriculogram, coronary angiogram, SVG angiogram, arterial conduit angiogram, PCI of the SVG to RCA.  OPERATOR:  Corky Crafts, MD  INDICATIONS:  Non-ST-elevation MI.  PROCEDURE NARRATIVE:  The risks and benefits of cardiac cath were explained to the patient and informed consent was obtained.  He was brought to the cath lab.  He was prepped and draped in usual sterile fashion.  His right groin was infiltrated with 1% lidocaine.  A 5-French sheath was placed into the right common femoral artery using modified Seldinger technique.  The wire selectively went up the LIMA, so LIMA catheter was eventually inserted into the vessel ostium.  Digital angiography was performed in multiple projections using hand injection of contrast.  Left coronary artery angiography was then performed using a JL-4 pigtail catheter in a similar fashion.  The JR-4 catheter was used to engage the native right and both SVGs in a similar fashion.  The pigtail catheter was advanced to the ascending aorta and across the aortic valve under fluoroscopic guidance.  Power injection of contrast performed in the RAO projection to image the left ventricle.  The catheter was pulled back under continuous hemodynamic pressure monitoring.  Angiomax was then started.  The intervention was then performed.  Please see below for details.  FINDINGS:  The LIMA to LAD was widely patent.  There is a moderate lesion in the LAD just past the insertion of the LIMA.  The left main has a distal 40%  stenosis.  Left circumflex had mild diffuse disease.  There is a small OM-1 with an 80% proximal lesion.  The majority of the circumflex went into a larger OM-2 branch.  There is also an atrial branch which may have been giving distal collaterals to the RCA territory.  Left anterior descending was occluded in the mid vessel.  The first diagonal was widely patent.  The right coronary artery was occluded proximally.  The SVG to RCA had a 50-60% proximal lesion.  Then in the midvessel stent, there is hazy stenosis which may have been thrombus up to 95%. The distal SVG stent was widely patent.  The SVG to diagonal vessel was widely patent.  The left ventriculogram showed normal ventricular function with estimated ejection fraction of 60%.  HEMODYNAMIC RESULTS:  Left ventricular pressure of 179/16 with an LVEDP of 29 mmHg.  Aortic pressure 177/75 with a mean aortic pressure of 119 mmHg.  PCI narrative:  The right coronary bypass guide was used.  We tried to advance the filter wire across the mid vessel lesion and passed the stenosis in the distal vessel as well, however, it would not cross the midvessel lesion.  No reflow was noted likely from some distal embolization.  A [name]  XT wire was then used to cross.  A 3.5 x 10 cutting balloon was advanced to the in-stent restenosis which was likely making it difficult for the filter wire to advance.  The filter wire was then advanced into the distal SVG to RCA.  A 3.5 x 38 ION stent was placed across the lesion in the distal SVG to RCA.  This is an 80% focal lesion passed the prior stent placed in the mid.  We tried to post dilate this with a 4.0 balloon, however, we are unable to deliver a 4.0 postop balloon to that area.  A 3.5 x 32 stent was then advanced to the area overlapping the proximal edge of the old stent to the mid RCA. This was deployed, but not long enough to cover also the grade proximal lesion and a 3rd overlapping stent  was placed 3.5 x 12 ION stent to the proximal area in the SVG to RCA.  This entire stented areas postdilated with 4.0 throughout the procedure.  Multiple doses of intracoronary adenosine and intracoronary verapamil were given with some significant improvement in the transient no reflow.  We also made a pass with an Export catheter which was successful in removing some debris.  After the stenting was performed, significant debris was removed from the filter.  The patient did have significant chest discomfort during the procedure.  He had EKG changes consistent with acute injury.  I think it is likely from the no reflow.  There is difficulty in advancing the retrieval catheter to take the filter back out and the filter was very full of debris and was difficult to compress into the retrieval device.  There was no residual stenosis at the end of the procedure.  There appeared to be TIMI-2 flow in the vein graft despite to transient no reflow.  IMPRESSION: 1. Percutaneous coronary intervention to saphenous vein graft to right     coronary artery with 3 drug-eluting stents, no reflow and     subsequent ischemia occurred in the inferior territory. 2. Pre-percutaneous coronary intervention, there was normal left     ventricular function. 3. Left internal mammary artery to left anterior descending and     saphenous vein graft to diagonal were widely patent. 4. Increased left ventricular end-diastolic pressure, will saline lock     IV to avoid worsening any heart failure symptoms.  RECOMMENDATIONS:  We will watch in the CCU.  He will likely have an inferior MI.  Watch for symptoms of CHF.  We will treat his chest pain with narcotics.  I believe this is secondary to microvascular obstruction.  I do not think taking him back to the cath lab would be very useful.  These findings were discussed with the family and with Dr. Katrinka Blazing.  We will continue Integrilin.  We will also continue Angiomax at the    reduced rate in the hopes of helping the microcirculation.  Continue IV nitroglycerin as well. Beta blocker as tolerated.     Corky Crafts, MD     JSV/MEDQ  D:  03/03/2011  T:  03/04/2011  Job:  [id number]  Electronically Signed by Lance Muss MD on 03/05/2011 08:19:31 AM

## 2011-03-05 NOTE — Cardiovascular Report (Signed)
NAMECAYLEN, YARDLEY NO.:  [id number]   MEDICAL RECORD NO.:  [id number]          PATIENT TYPE:  OIB   LOCATION:  6525                         FACILITY:  [facility]   PHYSICIAN:  Lyn Records, M.D.   DATE OF BIRTH:  Mar 09, 1941   DATE OF PROCEDURE:  06/10/2005  DATE OF DISCHARGE:                              CARDIAC CATHETERIZATION   INDICATIONS FOR PROCEDURE:  Recent inferior myocardial infarction with  recent PCI on degenerative saphenous vein graft to the right coronary/PDA.  At the time of catheterization, the patient was documented to have high-  grade stenosis in the mid circumflex with relatively small distal vessel.  This procedure is being done to perform PCI on the circumflex and to relook  at the saphenous vein graft to the right coronary.   PROCEDURES PERFORMED:  1.  Saphenous vein graft angiography.  2.  Drug-eluting stent into the mid circumflex (Taxus).   DESCRIPTION:  After informed consent, the patient was brought to the cath  lab. He had diminished pulses in both femoral regions with bruits. There had  been a previous hematoma in the right groin but this had significantly  resolved and we therefore went ahead and attempted to use the right femoral  for access to the descending aorta. We were able to place a stent but were  never able to get a wire including steerable floppy wires, a Wholey wire, or  a J-wire into the descending aorta. We then switched to the left femoral and  where able to freely get into the distal aorta. We placed a 6-French sheath  using the modified Seldinger technique. A bolus followed by an infusion of  Integrilin was given and ACT was documented to be greater than 300 seconds.   We then performed angiography of the saphenous vein graft to the right  coronary. We contemplated intervening on the saphenous vein graft to the  right coronary but because of the diffuse nature of the vessel and the high  likelihood of multiple  stent implantations which would greatly decrease the  likelihood of long-term patency, we chose a more conservative approach. We  then turned our attention to the circumflex. We used a CLS 6-French guide  catheter 3.5 cm bend. An Asahi medium guide wire was then used to cross over  the stenosis in the midcircumflex. Predilatation was performed with the  Maverick 12 mm long x 2.5 mm diameter. We then used a BMW wire to give  additional support in the vessel as a buddy wire. We then with some  difficulty were able to cross into the midcircumflex with a Taxus 2.75 x 16  mm long stent. We deployed this stent to 11 atmospheres and postdilated with  a Maverick 2.75 x 12 mm long balloon to 14 atmospheres which was an  effective diameter of 3.0 mm. We were unable to get a Quantum balloon to  cross the proximal margin of the stent hence the Maverick being used  postdilatation. TIMI grade 3 flow was noted postprocedure with 0% stenosis  noted. By the end of the procedure, the patient began complaining  of burning  in the anterior aspect of the right thigh. This was felt to be related to  the indwelling sheath that was probably decreasing blood flow into some of  the side branches that perfused the quadriceps.   Both the left and the right femoral sheaths were sewn in place and the  patient was taken to the holding area.   CONCLUSION:  1.  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 The previously intervened upon sites were      patent but clearly they had been recoiled in the distal stenosis at the      anastomosis. There was also some recoil in the distal lesion that had      been dilated as well as the midlesion that had been dilated. There was      also diffuse midvessel disease with an area of 60-70% stenosis above any      of the region that had been intervened upon. Because of poor distal      runoff and the extensive nature of the  disease in this graft, it was      felt that further intervention was probably not reasonable because of      the potential high ischemic complication rate.  2.  Successful drug-eluting stent in the midcircumflex with reduction in      stenosis from 85% to 0% with a Taxus 16 mm x 2.75 mm drug-eluting stent.   PLAN:  Medical therapy. Discontinue bivalirudin. Continue aspirin and  Plavix. Hopeful discharge June 11, 2005. Monitor the patient's right side  discomfort. If this continues after sheath removal, may need to have  vascular surgical evaluation.      Lyn Records, M.D.  Electronically Signed     HWS/MEDQ  D:  06/10/2005  T:  06/10/2005  Job:  [id number]   cc:   Holley Bouche, M.D.  [address]  [place], [place] [address]  Fax: [phone]

## 2011-03-05 NOTE — Op Note (Signed)
[facility]  Patient:    [name], [name]                     MRN: [id number] Proc. Date: 05/26/00 Adm. Date:  [id number] Disc. Date: [id number] Attending:  Melvenia Needles CC:         [name], M.D.  Celso Sickle, M.D.  [facility]   Operative Report  SURGEON:  [name], M.D.  DIAGNOSIS:  Bilateral lower extremity claudication.  PROCEDURE: 1. Mid abdominal aortogram, bilateral lower extremity runoff arteriography. 2. Left external iliac percutaneous transluminal angioplasty with deployment    of Palmaz Corinthian PQ 184B stent.  COMPLICATIONS:  None apparent.  ACCESS:  Bilateral common femoral arteries, 7-French sheath.  CONTRAST:  215 mL Visipaque [severe generalized debilitation.]  CLINICAL NOTE:  This is a 70 year old male with progressive severe bilateral debilitative claudication.  He is brought to the [facility] at this time for diagnostic arteriography and possible intervention.  PROCEDURE NOTE:  Patient brought to the [facility] in stable condition.  Informed consent obtained.  Patient was placed in the supine position.  Both groins were prepped and draped in a sterile fashion.  Skin and subcutaneous tissue in the right groin instilled with 1% Xylocaine.  The patient was administered 2 mg of Versed and 12.5 mg Demerol intravenously.  Needle was easily introduced in the right common femoral artery.  A standard 0.035 J wire was passed through the needle into the mid abdominal aorta. Needle removed and a 5-French sheath passed through the right groin.  A pigtail catheter was passed over the guide wire and positioned in the juxtarenal aorta.  Standard AP mid abdominal aortogram obtained.  This revealed widely patent renal arteries bilaterally.  There was a widely patent accessory right inferior renal artery also noted.  The infrarenal aorta revealed mild atherosclerotic  irregularity without dominant stenosis.  The pigtail catheter was brought down to the aortic bifurcation.  An AP pelvic arteriogram was obtained.  This revealed moderate plaque throughout the course of the common iliac arteries bilaterally with calcification but no dominant stenosis in the common iliac arteries.  The pigtail catheter was then exchanged over the guide wire for an end-hole catheter which was advanced into the left common iliac artery.  Left lower extremity runoff arteriography and oblique left iliac arteriogram was obtained.  LAO and RAO projection of the left iliac system were obtained.  There was a high grade 90% stenosis of the left external iliac artery at its origin.  The left internal iliac artery was patent although moderately diseased with plaque.  Left lower extremity runoff arteriography revealed mild stenosis of the left common femoral artery. The left profunda femoris artery was widely patent.  There was severe disease in the left superficial femoral artery with multiple subtotal occlusion and complete occlusion of the left superficial femoral artery at the adductor canal.  There was reconstitution of the above-knee popliteal artery which was moderately diseased.  There was approximately a 50% stenosis of the left popliteal artery at the knee joint.  Below the knee joint the left popliteal artery was normal in caliber.  Left lower extremity tibial runoff arteriography reveals mild tibial disease with dominant runoff at the left posterior tibial artery.  A glide wire was then advanced through the end-hole catheter into the left external iliac and common femoral artery.  The end-hole catheter was advanced over the  glide wire into the left common femoral artery. An exchange was carried out for a 0.035 Wholey guide wire.  The end-hole catheter and 5-French sheath were then removed from the right groin.  A 7-French Balkan sheath was then advanced over the [facility]  wire, however, could not be advanced across the aortic bifurcation.  The end-hole catheter was reinserted and an Amplatz super stiff wire was advanced through the end-hole catheter.  Another attempt was made to advance the Balkan sheath, however, this was unsuccessful.  The Amplatz wire was then withdrawn into the abdominal aorta.  A short 7-French sheath was then placed in the right groin.  The Amplatz wire was removed.  Retrograde injections were then made through the right femoral sheath. Oblique projections of the right iliac system were obtained.  These revealed evidence of a moderate, approximately 40-50% stenosis of the right external iliac artery just beyond its origin.  This was not felt to be flow limiting.  Right lower extremity runoff arteriography was obtained.  This revealed patent right external iliac and right common femoral arteries.  Right profunda femoris artery was normal.  Multiple subtotal occlusions of the right superficial femoral artery were present and a diseased popliteal artery above the knee was evident.  The right popliteal artery then became more normal in caliber below the right knee.  Tibial runoff revealed mild tibial disease with dominant runoff right posterior tibial artery.  Attention was then placed on the left external iliac stenosis.  The skin and subcutaneous tissue in the left groin were instilled with 1% Xylocaine.  A needle was introduced in the left common femoral artery.  A 0.035 Wholey guide wire was advanced through the needle and into the abdominal aorta across the left external iliac stenosis.  A long 7-French sheath wwas advanced over the left femoral guide wire.  The dilator was removed and the sheath flushed with heparin saline solution.  The patient was administered 3000 units of heparin intravenously.  A ______  tape was then used to mark the course of the left iliac system.  Retrograde hand injection of contrast made through the  left femoral sheath to identify site of left external iliac stenosis.  A 5 x 2 Powerflex balloon was then advanced over the guide wire and positioned at the left external iliac stenosis and inflated for eight atmospheres x 30 seconds.  The balloon was then removed and repeat retrograde hand injection revealed suboptimal dilation of the left external iliac stenosis.  A Corinthian PQ 184B stent was then loaded on a 7 x 2 Powerflex balloon.  This was then advanced to the site of left external iliac stenosis.  The PQ 184B stent was deployed at eight atmospheres for 30 seconds.  Following stent deployment, repeat retrograde hand injection through the left femoral sheath was made.  This revealed excellent position of the Corinthian stent at the origin of the left external iliac artery. No residual stenosis.  Mild distal dissection was noted which was not flow limiting.  This completed the angiogram procedure.  Guide wires and catheters were removed.  The patient was transferred to the holding area in stable condition without apparent complication.  Also noted at this time, was a diverticulum of the bladder.  These findings were reviewed with Dr. Patsi Sears, felt to be incidental and of no clinical significance.  FINAL IMPRESSION: 1. 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 2. Bilateral superficial femoral artery occlusive disease. 3. Mild tibial vessel  occlusive disease bilaterally. 4. Successful left external iliac percutaneous transluminal angioplasty and    stent deployment.  DISPOSITION:  This procedure and results have been discussed with the patient and his wife.  He will be placed on Plavix 75 and ASA 81 mg x 1 month.  he will return to the office in one month for review of his arteriogram findings and further decision regarding possible operative intervention of bilateral superficial femoral artery occlusive disease. DD:  05/26/00 TD:   05/26/00 Job: [id number]

## 2011-03-05 NOTE — Discharge Summary (Signed)
NAMEREMON, QUINTO NO.:  [id number]   MEDICAL RECORD NO.:  [id number]          PATIENT TYPE:  INP   LOCATION:  3740                         FACILITY:  [facility]   PHYSICIAN:  Lyn Records, M.D.   DATE OF BIRTH:  1941-06-25   DATE OF ADMISSION:  05/14/2005  DATE OF DISCHARGE:  05/21/2005                                 DISCHARGE SUMMARY   ADDENDUM:  This is a medication clarification. In discussing with Mr.  Staron, he mentioned that he has been placed into two [place] research  studies, one apparently for Plavix versus placebo as well as a study on  cholesterol medications, Acetamide Simvastatin combo versus Simvastatin  alone.  The patient will be receiving these medications from the research  nurse prior to discharge.      Allison L. Rennis Harding, N.P.      Lyn Records, M.D.  Electronically Signed    ALE/MEDQ  D:  05/21/2005  T:  05/21/2005  Job:  [id number]

## 2011-03-05 NOTE — Discharge Summary (Signed)
NAMESEAN, [name] NO.:  [id number]   MEDICAL RECORD NO.:  [id number]          PATIENT TYPE:  INP   LOCATION:  6524                         FACILITY:  [facility]   PHYSICIAN:  Lyn Records, M.D.   DATE OF BIRTH:  01-19-1941   DATE OF ADMISSION:  01/11/2006  DATE OF DISCHARGE:  01/13/2006                                 DISCHARGE SUMMARY   DISCHARGE DIAGNOSES:  1.  Coronary artery disease, status post drug-eluting stent to the saphenous      vein graft to the posterior descending artery on January 12, 2006.  2.  Dyslipidemia, treated.  3.  Hypertension, treated.  4.  Noninsulin-dependent diabetes mellitus.  5.  Former tobacco abuse.  6.  Multiple medication uses.   HOSPITAL COURSE:  Mr. Roza is a 70 year old male patient with a known  history of coronary artery disease, who had previously undergone coronary  artery bypass grafting.  In August 2006 he suffered a non-ST segment  elevated myocardial infarction and cardiac catheterization at that time  showed failure of the graft to the right coronary artery as well as  progressive disease of the native circumflex.  He underwent a percutaneous  intervention of the right coronary artery and soon after returned for  stenting of the circumflex.  The last procedure was complicated by emboli to  the feet.   The patient presented to [facility] on January 11, 2006, after experiencing  substernal chest pain on the evening of admission.  Cardiac enzymes were  essentially negative with a troponin of 0.08.  On discharge his creatinine  was 1.1, BUN 9, potassium 4.1.  Hemoglobin 13.8, hematocrit 40.5.   The patient underwent cardiac catheterization on January 12, 2006, and was  found to have saphenous vein graft to the PDA 99% stenosis with TIMI-1 flow.  Ultimately the patient underwent successful drug-eluting stent placement to  that area resulting in a 0% stenosis post procedure with TIMI-3 flow.   Other angiographic results  include:  LIMA to the LAD 50% ostial lesion,  otherwise patent.  Saphenous vein graft to the first and diagonal had a 60%  stenosis between the anastomoses.  Native vessel showed 30% left main, LAD  occluded at the midvessel.  Circumflex was patent at the stent site.  Right  coronary artery was chronically occluded.   The following day the patient tolerated ambulation, his vital signs were  stable, and he was ready for discharge to home.   We are discharging him home on the following medications:  1.  Enteric-coated aspirin 325 mg a day.  2.  Plavix 75 mg a day.  3.  Glipizide 5 mg a day.  4.  Imdur 60 mg a day.  5.  Metoprolol 50 twice a mg a day.  6.  Norvasc 5 mg a day.  7.  Simvastatin 40 mg a day.  8.  He is not to resume his metformin until January 16, 2006.   He is to follow up with Dr. Katrinka Blazing on January 31, 2006, at 2:45 p.m.  He is to  clean the catheterization site gently  with soap and water.  No lifting over  10 pounds for one week.  No driving for two days.  Remain on a low-fat  diabetic diet.  He is to call for any recurrent chest pain or groin pain or  swelling.      Guy Franco, P.A.      Lyn Records, M.D.  Electronically Signed    LB/MEDQ  D:  01/13/2006  T:  01/14/2006  Job:  [id number]   cc:   Lucrezia Europe, M.D.

## 2011-03-05 NOTE — H&P (Signed)
[name]. [facility]  Patient:    [name], [name]                       MRN: [id number] Adm. Date:  [id number] Attending:  Lyn Potter. Iii Dictator:   [name], N.P. CC:         [name], M.D.             [name], M.D.             [name], M.D.                         History and Physical  PRIMARY CARE PHYSICIAN:  [name], M.D.  DATE OF BIRTH:  May 18, 1941  BRIEF HISTORY:  [name] is a pleasant 70 year old male with a history of prior CABG x 6 in February of 1999.  He has ongoing heavy tobacco use and symptoms of lower extremity claudication for which he is scheduled to be evaluated for by [name].  The patient has noted, for approximately the last few months, intermittent episodes of left parasternal discomfort described as an ache occurring spontaneously.  There is no increase in frequency or severity and has actually improved over the last week.  He has no associated symptoms such as dizziness, dyspnea, palpations, nor radiation or discomfort.  Not particularly associated with exertion.  He also complains of sharp, fleeting, intense, anterior chest discomfort probably more related to musculoskeletal in etiology and sometimes can be precipitated with certain chest wall position changes.  The patient underwent a followup adenosine Cardiolite April 23, 2000, which was mildly abnormal demonstrating evidence of small region of inferobasal ischemia with preserved ejection fraction 67%.  He has been counselled to undergo a coronary angiography and bypass graft angiography to assess if evidence of progression CAD as etiology of chest discomfort and Cardiolite abnormality.  PREVIOUS MEDICAL HISTORY: 1. Coronary atherosclerotic heart disease.    a. (February 1999) CABG x 6 (Dr. Evelene Potter) with LIMA to LAD, sequential       SVG to diagonal 2 and diagonal 3, sequential SVG to acute marginal, to  posterior descending, to posterolateral.    b. (July 2001) Adenosine Cardiolite revealing evidence of small region of       inferobasal ischemia; normal overall LV function with EF of 67%. 2. Symptoms of lower extremity claudication; status post arterial Doppler    ultrasound last week.  Scheduled for followup office visit, [name], May 09, 2000. 3. Ongoing tobacco use; none for the last 3 days. 4. "Bone marrow cancer" followup radiation therapy approximately 8 years    earlier. 5. Esophageal dilation 1997. 6. Hiatal hernia diagnosed 1980. 7. Tonsillectomy. 8. DJD lower back.  ALLERGIES:  No known drug allergies.  Okay with seafood, shellfish, and iodinated products.  MEDICATIONS: 1. Vioxx q.d. or less p.r.n. back discomfort. 2. Enteric-coated aspirin 325 mg once daily. 3. Prevacid 1 daily.  SOCIAL HISTORY AND HABITS:  Tobacco:  Approximately 2 packs per day for 44 years.  ETOH:  Former heavy user.  None since before bypass surgery. Caffeine:  About 2 cups of coffee in the morning.  The patient is married for 5 years.  He has 1 son and 2 daughters from prior marriage in good health.  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  FAMILY HISTORY:  His mother is age 26 with hypertension; recent evaluation for CAD.  Father is age 40 and has a pacemaker but no prior history of CAD.  He has a sister age 28, and a brother age 27 alive and well.   REVIEW OF SYSTEMS:  Wears bifocals.  Negative tinnitus.  Denies syncope, near syncopal nor light-headed symptoms.  Denies palpitations.  Occasional lower extremity swelling.  Negative PND, orthopnea nor DOE.  Episodic feelings of GERD much relieved with use of Prevacid.  Denies dysphagia to food or fluid. Negative melena.  No bright red blood PR.  Negative constipation.  No diarrhea.  Denies hematuria nor dysuria.  Does have problems with lower extremity achiness and  discomfort which has progressed and for which he is undergoing evaluation by [name] in the near future.  PHYSICAL EXAMINATION:  VITAL SIGNS:  Blood pressure 123/70 with heart rate 64 and regular, respiratory rate 16, temperature 98.5, height 5 feet 6 inches.  Weight 180 pounds.  GENERAL:  He is well-nourished 70 year old male in no acute distress.  His wife is in attendance.  HEENT:  Brick bilateral carotid upstroke without bruits.  NECK:  No JVD, no thyromegaly.  CARDIAC:  Regular rate and rhythm without murmur, rub or gallop.  Normal S1, S2.  CHEST:  Sounds with poorly localized faint wheezing otherwise clear.  Negative CPA tenderness.  ABDOMEN:  Soft, moderately obese, normoactive bowel sounds.  Negative abdominal aorta, renal or femoral bruit.  He is slightly uncomfortable to applied pressure under umbilical area.  No masses, no organomegaly appreciated.  EXTREMITIES:  Plus 2/4 bilateral radial, femoral, plus 1/4 right dorsalis pedis, posterior tibial pulses.  Unable to appreciate dorsalis pedis, and posterior tibial pulse.  NEUROLOGIC:  Cranial nerves II-XII grossly intact.  Alert and oriented x 3.  GENITAL/RECTAL:  Deferred.  LABORATORY DATA AND TESTS:  Chest x-ray from April 26, 2000, revealed clear lungs but very vague density in the right mid lung which may represent scarring; no prior chest x-rays were available for comparison at [name].  Blood work from April 26, 2000, revealed glucose 131, BUN 17, creatinine 1.2, sodium 138, potassium 4.6, chloride of 99, CO2 26, calcium 9.4.  LFTs within normal range though alk. phos. slightly elevated 146. Hemoglobin of 17.3, with hematocrit of 49.5, platelets of 278, and WBC of 8.3. Pro time 11.7, with INR of 1 and PTT of 30.  EKG from April 21, 2000, reveals right bundle-branch block; no acute ischemic changes.  IMPRESSION: 1. Chest discomfort somewhat atypical in nature in this 70 year old who is      status post CABG x 6 2 years earlier with ongoing heavy tobacco use.    Followup myocardial perfusion scan was suspicious for small region of    inferobasal ischemia with preserved LV function.  2. Symptoms of lower extremity claudication status post recent lower extremity    arterial Doppler ultrasound (results pending).  Followup appointment with    Dr. Gwendalyn Ege May 09, 2000.  3. Ongoing heavy tobacco use.  PLAN:  The patient has been counselled to undergo and accepted plans for coronary angiography with bypass angiography and possible percutaneous intervention if indicated and able.  The risks, potential complications, benefits and alternatives to procedure discussed in detail.  Mr. Soffer and his wife indicate there questions and concerns have been addressed and are agreeable to proceed. DD:  04/28/00 TD:  04/28/00 Job: [id number]

## 2011-03-05 NOTE — Cardiovascular Report (Signed)
NAMEROBER, SKEELS NO.:  [id number]   MEDICAL RECORD NO.:  [id number]          PATIENT TYPE:  INP   LOCATION:  6524                         FACILITY:  [facility]   PHYSICIAN:  Lyn Records, M.D.   DATE OF BIRTH:  14-May-1941   DATE OF PROCEDURE:  01/13/2008  DATE OF DISCHARGE:  01/13/2006                              CARDIAC CATHETERIZATION   REASON FOR PROCEDURE:  Unstable angina with trace positive enzymes.  Prior  coronary artery bypass grafting in 1999 with an August 2006 admission for  unstable angina that resulted in angioplasty of the saphenous vein graft to  the right coronary artery.   PROCEDURES PERFORMED:  1.  Left heart cath.  2.  Selective coronary angiography.  3.  Left ventriculography.  4.  Bypass graft angiography including left internal mammary angiography.  5.  PCI of the saphenous vein graft to the right coronary with distal      protection.  6.  Intracoronary adenosine administration.   DESCRIPTION:  After informed consent, a 6-French sheath was started in the  right femoral artery using the modified Seldinger technique.  A 6-French A2  multipurpose catheter was used for hemodynamic recordings, left  ventriculography by hand injection and selective left coronary angiography  and saphenous vein graft angiography.  We then used an internal mammary  artery catheter for the left internal mammary artery angiogram.   After reviewing the angiographic data, it was felt that severe high-grade  restenosis in the saphenous vein graft to the right coronary was the culprit  for the patient's presentation.   Since the patient's LIMA and the saphenous vein graft to the diagonals were  open and the patient is having ongoing and recurring episodes of chest  discomfort.  It was felt that an attempt at opening the right coronary graft  was indicated.  When this graft was injected, there was TIMI grade I to II  flow.   We gave initially 5000 units of IV  heparin and subsequently an additional  2500 units were given.  ACT initially was greater than 301 and at the end of  the case 387.   Using 300 cm long BMW wire to cross the stenosis in the right coronary.  We  then used a 9 mm long x 2.5 mm Maverick monorail balloon to predilate the  distal saphenous vein graft at its anastomosis with a very small PDA branch.  We were then able to deploy a 2.75 x 18 Cypher stent distally.  The proximal  two-thirds of this graft were then postdilated with a 3.5 x 8 mm long  Quantum maverick balloon.  We then turned our attention to a high-grade mid-  saphenous vein graft stenosis of greater than 80%.  We removed the [employer]ahi  Prowater 300 cm long wire and used a filter wire system.  We placed a filter  wire in the distal one-third of the saphenous vein graft being careful not  to come close to the distally deployed stent.  We then deployed a 33 mm long  x 3.5 mm Cypher stent to 14 atmospheres and  postdilated with a 4.0 x 20 mm  Quantum Maverick balloon sequential overlapping inflations to 12 atmospheres  were performed.  A good angiographic result was obtained with TIMI grade II-  1/2 to III flow noted.  Because of initial sluggish flow, 30 micrograms of  intracoronary adenosine was administered.  This did improve flow.  Some  lingering chest burning dramatically improved after adenosine infusion.   After final angiography was performed, the case was terminated.   RESULTS:  1.  Hemodynamic data:      1.  Aortic pressure 158/66.      2.  Left ventricular pressure 152/7 mmHg.  2.  Left ventriculography:  Severe inferior hypokinesis is noted.  EF 60%.      No MR.  3.  Coronary angiography:      1.  Left main distal 30% narrowing.      2.  Left anterior descending coronary:  LAD appears to be totally          occluded in the midvessel, although when the mammary artery is          injected there is still patency of the mid-LAD all the way back to           the larger proximal LAD.  The first diagonal is free of significant          obstruction.      3.  Circumflex artery:  Circumflex artery gives origin to three obtuse          marginal branches, each of which is small.  They do supply          collaterals to a distal left ventricular branch.  The previously          placed stent in the proximal circumflex is widely patent.      4.  The right coronary:  This is a chronic total occlusion that was not          selectively engaged.  4.  Bypass graft angiography.      1.  Saphenous vein graft sequential graft to the first and second          diagonal:  This graft contains a 60-70% stenosis between this          anastomoses with the first and second diagonal.  This was unchanged          from the August angiogram.      2.  Saphenous vein graft to the posterior descending:  This graft          contains high-grade obstruction with 99% distal stenosis, TIMI grade          I to II flow.  The anastomoses with the acute marginal branch and          the left ventricular branch have been previously documented to be          occluded.      3.  LIMA to the LAD:  Widely patent. Reflux into the mid-LAD is noted.          Severe diffuse disease is present in this region.  There may be 50%          ostial narrowing of the LIMA to the LAD as it arises from the          subclavian.  5.  PCI of the saphenous vein graft to the right coronary.      1.  The 99% distal stenosis  was reduced to 0% after angioplasty and          Cypher stent implantation as described above.      2.  The mid-80% stenosis was reduced to less than 10% stenosis after          Cypher stenting and post dilatation with 4.0 mm Quantum Maverick          balloon using distal filter wire protection.   CONCLUSION:  1.  Unstable angina due to restenosis of the distal anastomoses of saphenous      vein graft to the posterior descending artery.  There was also high-     grade disease in the  mid-graft.  2.  Successful recanalization via angioplasty and stenting using Cypher drug-      eluting stents in this bypass graft with reduction in distal 99%      stenosis and mid-80% stenosis to 0% and less than 10% respectively with      re-establishment of TIMI grade III flow.  3.  A 60-70% stenosis in the saphenous vein graft to the diagonals in the      region of the graft between the two diagonal anastomoses.  4.  Severe native vessel disease with high-grade severe mid-left anterior      descending artery disease, total occlusion of the right coronary.  5.  Overall normal left ventricular function but with severe inferior wall      hypokinesis.  6.  Continued patency of the proximal circumflex previously stented in      August 2006  7.  Patent left internal mammary artery to the left anterior descending      artery but with possible 50% ostial narrowing in the left internal      mammary artery at its origin from the subclavian.   PLAN:  Aspirin and Plavix.  Plavix should be continued for life time.  Consider elective intervention on the saphenous vein graft to the diagonal  if the patient has continued anginal symptoms      Lyn Records, M.D.  Electronically Signed     HWS/MEDQ  D:  01/12/2006  T:  01/13/2006  Job:  [id number]   cc:   Holley Bouche, M.D.  Fax: [phone]   P. Liliane Bade, M.D.  [address]  [place]  [place] [address]   Sheliah Plane, MD  [address]  [place]  [place] [address]

## 2011-03-05 NOTE — Cardiovascular Report (Signed)
[facility]  Patient:    [name], [name]                       MRN: [id number] Proc. Date: 04/28/00 Adm. Date:  [id number] Attending:  [name] CC:         [name], M.D.             [name], M.D.             [name], M.D.                        Cardiac Catheterization  CINE NUMBER:  [id number].  PROCEDURES: 1. Left heart catheterization. 2. Selective coronary angiography. 3. Left ventriculography. 4. Saphenous vein graft angiography. 5. Left internal mammary artery angiography. 6. Abdominal aortography with peripheral runoff.  INDICATION:  Chest pain, two and a half years status post bypass surgery in a patient who has continued to smoke and has been lost to follow-up.  There is also a significant history of suggesting claudication.  Recent Cardiolite study demonstrated mild inferior wall ischemia.  DESCRIPTION OF PROCEDURE:  After informed consent, a 6 [place] was inserted into the right femoral artery using a modified Seldinger technique.  A 6 French A2 multipurpose catheter was used for hemodynamic recording, hand injection left ventriculography, selective left and right coronary angiography, and saphenous vein graft angiography.  This catheter was then removed and an internal mammary catheter was used for left internal mammary artery selective angiography.  We then performed abdominal aortography with a 6 French pigtail catheter.  Then 12 cc per second for a total of 40 cc were injected.  The patient tolerated the procedure without complications.  RESULTS:  Hemodynamic data: 1. Aortic pressure 150/71. 2. Left ventricular pressure 150/15.  Left ventriculography:  The left ventricle was normal in size and demonstrates normal contractility.  The EF was greater than 60%.  Selective coronary angiography: 1. Left main coronary:  Widely patent. 2. Left anterior descending coronary artery:  This artery  is diffusely    diseased.  It appeared to be totally occluded in the mid-vessel, although    I have the feeling there may be competitive flow.  The mammary enters this    artery distally.  The second and third diagonal branches are totally    occluded.  A large first diagonal branch was widely patent.  There is    diffuse disease in the proximal and mid-LAD before the total occlusion. 3. Circumflex artery:  The circumflex artery gives origin to a large first    obtuse marginal branch and a small distal obtuse marginal branch.    Irregularity is noted in the proximal circumflex in the first obtuse    marginal and there is 60-80% stenosis before the origin of the small    second obtuse marginal branch distally.  This was not graftable at the    time of surgery. 4. Right coronary:  Totally occluded in the mid-vessel. 5. Bypass graft angiography:  Sequential saphenous vein graft to the second    and third diagonal widely patent.  The diagonal branches were small but    no significant obstruction is noted. 6. Sequential saphenous vein graft to the acute margin of the right coronary,    PDA, and the left ventricular branch.  The graft was widely patent.  The    PDA, LV branch, and acute marginal branches  were small and diffusely    diseased. 7. Left internal mammary graft to the LAD:  The left internal mammary    graft to the LAD is widely patent.  There was catheter dampening on    engagement but no focal stenosis or ostial lesions felt to be present.  Abdominal aortography:  The aortography reveals dual right renal arteries with irregularities in the mid-portion of each branch up to 50%.  There is 30-40% stenosis in the mid-body of the left renal arteries.  There is diffuse bilateral iliac disease noted.  There is a high grade focal stenosis at 90 plus percent in the left iliac.  CONCLUSIONS: 1. Severe native vessel coronary disease with diffuse functional total    occlusion of the  mid-left anterior descending coronary artery, total    occlusion of the second and third diagonals, and total occlusion of    the right coronary.  There is distal circumflex disease before the origin    of the small second obtuse marginal. 2. Normal left ventricular function. 3. Patent saphenous vein graft as outlined above. 4. Patent left internal mammary graft as outlined above. 5. 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  There is also mild atherosclerosis in each of dual right    renal arteries and in the left renal arteries.  RECOMMENDATIONS:  Peripheral vascular consultation by Dr. [name] concerning claudication.  Medical therapy for coronary disease.  Risk factor modification including continued encouragement to discontinue cigarette smoking. DD:  04/28/00 TD:  04/28/00 Job: [id number]

## 2011-03-05 NOTE — H&P (Signed)
NAMEZLATAN, HORNBACK NO.:  [id number]   MEDICAL RECORD NO.:  [id number]          PATIENT TYPE:  INP   LOCATION:  4705                         FACILITY:  [facility]   PHYSICIAN:  Ulyses Amor, MD DATE OF BIRTH:  Sep 14, 1941   DATE OF ADMISSION:  01/12/2006  DATE OF DISCHARGE:                                HISTORY & PHYSICAL   CARDIOLOGY ADMISSION NOTE    [name] is a 70 year old white man who is admitted to [facility] for further evaluation of chest pain.   The patient has a history of coronary artery disease which dates back to  1999.  At that time, he suffered an acute myocardial infarction.  He then  underwent coronary artery bypass surgery.  His course was uncomplicated  until August, 2006.  At that time, he suffered a non-ST segment elevation  myocardial infarction.  Cardiac catheterization demonstrated failure of the  graft to the right coronary artery as well as progressive disease in the  native circumflex.  He underwent PCI of the native right coronary artery.  Several weeks later, he returned for elective stenting of the circ.  That  last procedure was complicated by emboli to the feet.   The patient presented transported to the emergency department after  experiencing an episode of chest pain this evening at home.  He was watching  television at the time.  The chest pain was described as an indigestion in  his substernal region and left anterior chest.  It did not radiate.  It was  not associated with dyspnea, diaphoresis or nausea.  There were no  exacerbating or ameliorating factors.  It appeared not to be related to  position, activity, meals or respirations.  It persisted over the ensuing  hours and prompted his visit transported to the emergency department.  He  took two nitroglycerins at home which resulted in some improvement, though,  no resolution of the chest pain.  Since his arrival in the department, his  chest pain  has largely resolved.   There is no history of congestive heart failure or arrhythmia.   The patient has a number of risk factors for coronary artery disease  including hypertension, dyslipidemia, non-insulin-dependent diabetes  mellitus and smoking (discontinued a number of years ago).  There is no  family history of coronary artery disease.   The patient's only other medical problems are peripheral vascular disease  and gastroesophageal reflux.   MEDICATIONS:  Aspirin, glipizide, isosorbide mononitrate, metformin,  metoprolol, Norvasc, Plavix and Prilosec.   ALLERGIES:  None.   OPERATIONS:  1.  Coronary artery bypass surgery.  2.  Neck surgery.   SOCIAL:  The patient works as an Soil scientist.  He lives with his  wife.  He neither smokes cigarettes nor drinks alcohol.   FAMILY HISTORY:  Notable for diabetes and dyslipidemia.   REVIEW OF SYSTEMS:  No new problems related to his head, eyes, ears, nose,  mouth, throat, lungs, gastrointestinal systems, genitourinary system or  extremities.  There is no history of neurologic or psychiatric disorder.  There is no  history of fever, chills or weight loss.   PHYSICAL EXAMINATION:  VITAL SIGNS:  Blood pressure 157/51.  Pulse 56 and  regular.  Respirations 24.  Pulse ox is 95% on room air.  GENERAL:  The patient was a white man in no discomfort.  He was alert,  oriented, appropriate and responsive.  HEAD, EYES, NOSE AND MOUTH:  Normal.  NECK:  Without thyromegaly or adenopathy.  Carotid pulses were palpable  bilaterally without bruits.  CARDIAC:  A normal S1 and S2.  There was no S3, S4, murmur, rub or click.  Cardiac rhythm was regular.  No chest wall tenderness was noted.  LUNGS:  Clear.  ABDOMEN:  Soft and nontender. There was no mass, hepatosplenomegaly, bruit,  distention, rebound, guarding or rigidity.  Bowel sounds were normal.  RECTAL/GENITAL:  Examinations were not performed, as they are not pertinent  to the  reason for acute care hospitalization.  EXTREMITIES:  Without edema, deviation or deformity.  Radial and dorsalis  pedis pulses were palpable bilaterally.  NEUROLOGIC:  Brief screening neurologic survey was unremarkable.   The electrocardiogram demonstrated sinus bradycardia and right bundle branch  block.  There were no reported __________ changes suspect for ischemia or  infarction.  White count was 8.6 with a hemoglobin of 14.4 and hematocrit of  42.0.  Potassium is 4.5, BUN 14 and creatinine 0.9.  The initial set of  cardiac markers revealed a myoglobin of 56.0, CK-MB less than 1.0 and  troponin less than 0.05.  The second set of cardiac markers revealed a  myoglobin of 49.9, CK-MB less than 1.0, and troponin less than 0.05.  The  final set of cardiac markers revealed a myoglobin of 53.6, CK-MB less than  1.0 and troponin less than 0.05.  The remaining studies were pending at the  time of this dictation.  The chest radiograph, according the radiologist,  demonstrated no evidence of acute disease.   IMPRESSION:  1.  Chest pain; rule out unstable angina.  2.  Coronary artery disease.  Status post myocardial infarction.  Status      post non-   DICTATION ENDED AT THIS POINT.      Ulyses Amor, MD  Electronically Signed     MSC/MEDQ  D:  01/12/2006  T:  01/12/2006  Job:  [id number]

## 2011-03-05 NOTE — H&P (Signed)
[name], [name] NO.:  [id number]   MEDICAL RECORD NO.:  [id number]          PATIENT TYPE:  EMS   LOCATION:  MAJO                         FACILITY:  [facility]   PHYSICIAN:  Elmore Guise., M.D.DATE OF BIRTH:  11-Mar-1941   DATE OF ADMISSION:  05/14/2005  DATE OF DISCHARGE:                                HISTORY & PHYSICAL   INDICATION FOR ADMISSION:  Non-ST-elevation MI.   PRIMARY CARDIOLOGIST:  Dr. Garnette Scheuermann, [facility].   HISTORY OF PRESENT ILLNESS:  The patient is a 70 year old white male with  past medical history of coronary artery disease (status post coronary artery  bypass graft in 1999), diabetes mellitus, obesity, history of tobacco  dependence and peripheral vascular disease, who presented to the ER with off-  and-on chest pain times 24 hours. He reports he initially started having  chest pain yesterday morning after he got to work approximately 8 a.m. It  was initially sharp with some sternal pressure.  He started belching all  morning and after a couple hours his symptoms were totally relieved.  His  symptoms returned briefly this p.m. and then resolved spontaneously. He went  to bed about 9 p.m.  He woke at 12 a.m. with severe chest pain, called 9-1-  1.  Prior to them getting there, he started having diaphoresis, shortness of  breath and nausea and vomiting. He was given some nitroglycerin with  improvement in his symptoms. He was started on nitroglycerin drip once he  got to the ER and is now chest pain-free.   REVIEW OF SYSTEMS:  He reports mild tenderness in his upper chest area to  palpation. Otherwise, he is limited via his exertional activity to less than  100 yards secondary to claudication. He denies any fever, chills, nausea,  vomiting, diarrhea, melena, bright red blood per rectum. Otherwise, review  of systems are negative.   MEDICATIONS:  His current medications are Diovan 160 mg daily, Prilosec 20  mg daily, aspirin 81  mg daily, Glucophage 1 gram b.i.d.   ALLERGIES:  None.   FAMILY HISTORY:  Positive for diabetes and dyslipidemia.   SOCIAL HISTORY:  He is married, has approximately 75-pack year history of  tobacco, no significant alcohol.   PHYSICAL EXAMINATION:  VITAL SIGNS:  He is afebrile, pulse in the 70s, blood  pressure 150/90, saturations 95% on room air.  GENERAL:  He is a very pleasant, middle-aged, white male.  Alert and  oriented x 4. No acute distress.  HEENT:  Appear normal.  NECK:  Supple. No lymphadenopathy, 2+ carotids. No JVD and no bruits.  LUNGS:  Clear with prolonged expiratory phase.  HEART:  Regular with a normal S1-S2. No significant murmurs, gallops or  rubs.  ABDOMEN:  Obese, soft, nontender, nondistended. No rebound or guarding.  EXTREMITIES:  Warm with trace pedal pulses and no significant edema.   LABORATORY DATA:  H&H of 16 and 47.  Normal coags of 12.6, 0.9 and 28, BUN  and creatinine of 22 and 1.2.  Cardiac enzymes show CPK-MB of 5.0 going to  6.8, troponin-I of 0.49 going  to 0.68 and myoglobin of 85 increasing to 111.  Chest x-ray shows no acute cardiopulmonary disease and ECG shows normal  sinus rhythm, rate of 92 per minute, right atrial enlargement and right  bundle branch block. No significant ST or T-wave changes other than  repolarization abnormalities from his right bundle branch block.   IMPRESSION:  1.  Non-ST-elevation myocardial infarction.  2.  Known coronary artery disease.  3.  Diabetes mellitus.  4.  Hypertension.  5.  Peripheral vascular disease.  6.  Gastroesophageal reflux disease.   PLAN:  1.  From cardiovascular standpoint, will admit him to telemetry bed. Will      continue heparin drip, start Integralin 180 mcg/kg bolus then 2 mcg/kg      per minute, the patient was given aspirin. Will continue his      nitroglycerin drip and titrate to his pain. Will also continue his      Diovan 160 mg daily. Will check his lipids.  However, with his  history,      will go ahead and empirically start Vytorin 10/40 mg once daily. The      patient will be n.p.o. for possible cath and intervention if needed.  2.  Diabetes mellitus. Will hold his Glucophage secondary to possibility of      cardiac catheterization. The patient will be placed on sliding scale      insulin, but will be currently n.p.o. for time being.  3.  Hypertension. The patient will continue his Diovan and will also add      Lopressor 25 mg b.i.d. as well as nitroglycerin drip.  4.  Gastroesophageal reflux disease. Will continue his Prilosec. Further      measures per Dr. Garnette Scheuermann.       TWK/MEDQ  D:  05/14/2005  T:  05/14/2005  Job:  [id number]

## 2011-03-05 NOTE — Cardiovascular Report (Signed)
NAMEJERONE, [name] NO.:  [id number]   MEDICAL RECORD NO.:  [id number]          PATIENT TYPE:  INP   LOCATION:  2930                         FACILITY:  [facility]   PHYSICIAN:  Lyn Records, M.D.   DATE OF BIRTH:  [birth year]   DATE OF PROCEDURE:  05/18/2005  DATE OF DISCHARGE:                              CARDIAC CATHETERIZATION   INDICATION FOR STUDY:  Recurring chest pain in this 70 year old diabetic  patient with history of coronary bypass grafting and recent acute non-ST-  elevation myocardial infarction who has been having recurrent pain. Recent  cardiac cath demonstrated highly degenerated severely obstructed saphenous  vein graft to the right coronary.   PROCEDURE PERFORMED:  PTCA of the saphenous vein graft to the right.   DESCRIPTION:  After informed consent, a 6-French arterial sheath and a 5-  French venous sheath was placed in the right femoral artery and vein  respectively. Then 2% Xylocaine was used for local anesthesia. A 6-French  side-hole right bypass graft catheter was then used to obtain guiding shots.  Dr. Jaymes Graff assisted during this procedure. We have the  patient on Integrilin and gave a double bolus of iodine infusion of  Angiomax. ACT was documented be greater than 225. We then performed PCI  using a BMW wire and multiple balloons including a 2.0 x 15 mm long  Maverick, 2.5 x 12 mm long Quantum, a 2.5 x 6 mm long cutting balloon.  Multiple balloon inflations were performed with both catheters in the distal  location and the 2.0 balloon was also used to predilate the mid and distal  lesion as well as the anastomotic distal lesion. We then performed  angioplasty using a 3.5 x 15 mm Maverick balloon on the distal and mid  lesions. TIMI grade III flow was established after angioplasty. The mid  lesion was reduced to less than 30% with still some thrombotic debris. The  distal lesion was reduced to less than 30% and the distal  anastomotic lesion  was reduced from 99% to less than or equal to 70%. There appeared to be a  lot of elastic recoil.   We injected the arterial sheath but were unable to Angio-Seal because of  moderately severe peripheral vascular disease involving the iliac and  femoral. Angiomax was discontinued postprocedure.   CONCLUSION:  Successful angioplasty on the saphenous vein graft to the right  coronary with the following results:  Reduction in the midbody saphenous vein obstruction from 99% to less than  30% with some thrombotic debris remaining, reduction in distal saphenous  vein graft obstruction from 90% to less than 30%, reduction in distal  anastomotic stenosis from 99% to less than 75%. TIMI grade III flow was  established after starting with TIMI grade I flow.   PLAN:  Aspirin and Plavix. Consider restudy in 2-4 weeks if symptoms allow.  At that time, we will be coming back to do stent of the circumflex and may  consider touching up the saphenous vein graft to the right coronary  depending on appearance.      Lyn Records,  M.D.  Electronically Signed     HWS/MEDQ  D:  05/19/2005  T:  05/20/2005  Job:  [id number]   cc:   Holley Bouche, M.D.  [address]  [place], [place] [address]  Fax: [phone]   Sheliah Plane, MD  [address]  [place]  [place] [address]  Email: [email or web address]   Balinda Quails, M.D.  [address]  [place]  [place] [address]

## 2011-03-06 LAB — CARDIAC PANEL(CRET KIN+CKTOT+MB+TROPI)
CK, MB: 8.5 ng/mL (ref 0.3–4.0)
Total CK: 99 U/L (ref 7–232)

## 2011-03-06 LAB — GLUCOSE, CAPILLARY: Glucose-Capillary: 141 mg/dL — ABNORMAL HIGH (ref 70–99)

## 2011-03-08 LAB — GLUCOSE, CAPILLARY: Glucose-Capillary: 117 mg/dL — ABNORMAL HIGH (ref 70–99)

## 2011-03-10 NOTE — Discharge Summary (Signed)
  NAMECAMAR, David NO.:  [id number]  MEDICAL RECORD NO.:  [id number]           PATIENT TYPE:  I  LOCATION:  2011                         FACILITY:  [facility]  PHYSICIAN:  Corky Crafts, MDDATE OF BIRTH:  1941/07/15  DATE OF ADMISSION:  03/02/2011 DATE OF DISCHARGE:  03/06/2011                              DISCHARGE SUMMARY   PRIMARY CARDIOLOGIST:  Lyn Records, MD  FINAL DIAGNOSES: 1. Non-ST elevation myocardial infarction. 2. Significant coronary bypass graft disease with multiple drug-     eluting stent placement in the saphenous vein graft to right     coronary artery. 3. Peripheral vascular disease with occluded right superficial femoral     artery noted at the time of cardiac catheterization. 4. Diabetes. 5. Hypertension. 6. Hyperlipidemia.  PROCEDURES PERFORMED:  Cardiac catheterization on May 16 with aspiration thrombectomy and several drug-eluting stents placed into the SVG to RCA. This was complicated by transient.  No reflow.  HOSPITAL COURSE:  The patient was admitted with symptoms of unstable angina.  He did rule in for MI and was taken to the cath lab.  At that time the SVG to RCA which has had multiple interventions in the past again was found to have significant stenosis.  He had significant chest pain during the procedure because of likely distal embolization. Subsequently the intervention was finished but he had residual ST- segment changes likely secondary to problems in the microcirculation. He received multiple doses of narcotics to help with the pain.  He remained hemodynamically stable.  His heart rhythm remained stable as well.  He was watched in the CCU overnight and did well.  The day after the procedure he was pain free.  He was up walking around without any angina.  His blood pressure was stable in the hospital.  He had no bleeding problems.  Of note, he does have an occluded right SFA just from the femoral angiogram  performed.  He does admit to claudication at home but he does not do much walking.  We recommended that he increase the amount of walking that he does to try and help build more collateral circulation to his right leg.  DISCHARGE MEDICATIONS: 1. Metoprolol XL 25 mg daily. 2. Amlodipine 10 mg daily. 3. Aspirin 325 mg daily. 4. Atorvastatin 20 mg daily. 5. Diovan 320 mg daily. 6. Glipizide 5 mg at bedtime. 7. Metformin 1 gram b.i.d. 8. Sublingual nitroglycerin p.r.n. 9. Omeprazole 20 mg daily. 10.Plavix 75 mg daily. 11.Vitamin D. 12.Sleep aid, he will stop taking clonidine.  ACTIVITY:  Increase activity slowly.  He can shower and bathe.  No lifting more than 10 pounds for about a week.  DIET:  Low-sodium heart-healthy diet.  Followup appointments with Dr. Katrinka Blazing in 1-2 weeks.  SPECIAL INSTRUCTIONS:  He is to avoid straining.  He is also to walk more to try and help his leg circulation.     Corky Crafts, MD     JSV/MEDQ  D:  03/06/2011  T:  03/06/2011  Job:  [id number]  Electronically Signed by Lance Muss MD on 03/10/2011 05:10:31 PM

## 2011-04-12 ENCOUNTER — Other Ambulatory Visit: Payer: Self-pay | Admitting: Interventional Cardiology

## 2011-04-12 ENCOUNTER — Encounter (HOSPITAL_COMMUNITY): Payer: Medicare Other | Attending: Interventional Cardiology

## 2011-04-12 DIAGNOSIS — I7092 Chronic total occlusion of artery of the extremities: Secondary | ICD-10-CM | POA: Insufficient documentation

## 2011-04-12 DIAGNOSIS — I25810 Atherosclerosis of coronary artery bypass graft(s) without angina pectoris: Secondary | ICD-10-CM | POA: Insufficient documentation

## 2011-04-12 DIAGNOSIS — I70209 Unspecified atherosclerosis of native arteries of extremities, unspecified extremity: Secondary | ICD-10-CM | POA: Insufficient documentation

## 2011-04-12 DIAGNOSIS — I214 Non-ST elevation (NSTEMI) myocardial infarction: Secondary | ICD-10-CM | POA: Insufficient documentation

## 2011-04-12 DIAGNOSIS — E119 Type 2 diabetes mellitus without complications: Secondary | ICD-10-CM | POA: Insufficient documentation

## 2011-04-12 DIAGNOSIS — E785 Hyperlipidemia, unspecified: Secondary | ICD-10-CM | POA: Insufficient documentation

## 2011-04-12 DIAGNOSIS — Z9861 Coronary angioplasty status: Secondary | ICD-10-CM | POA: Insufficient documentation

## 2011-04-12 DIAGNOSIS — Z5189 Encounter for other specified aftercare: Secondary | ICD-10-CM | POA: Insufficient documentation

## 2011-04-12 DIAGNOSIS — I10 Essential (primary) hypertension: Secondary | ICD-10-CM | POA: Insufficient documentation

## 2011-04-12 LAB — GLUCOSE, CAPILLARY: Glucose-Capillary: 313 mg/dL — ABNORMAL HIGH (ref 70–99)

## 2011-04-14 ENCOUNTER — Other Ambulatory Visit: Payer: Self-pay | Admitting: Interventional Cardiology

## 2011-04-14 ENCOUNTER — Encounter (HOSPITAL_COMMUNITY)
Admission: RE | Admit: 2011-04-14 | Discharge: 2011-04-14 | Payer: Medicare Other | Source: Ambulatory Visit | Attending: Interventional Cardiology | Admitting: Interventional Cardiology

## 2011-04-15 LAB — GLUCOSE, CAPILLARY: Glucose-Capillary: 103 mg/dL — ABNORMAL HIGH (ref 70–99)

## 2011-04-16 ENCOUNTER — Encounter (HOSPITAL_COMMUNITY): Payer: Medicare Other

## 2011-04-16 ENCOUNTER — Other Ambulatory Visit: Payer: Self-pay | Admitting: Interventional Cardiology

## 2011-04-16 LAB — GLUCOSE, CAPILLARY: Glucose-Capillary: 116 mg/dL — ABNORMAL HIGH (ref 70–99)

## 2011-04-19 ENCOUNTER — Other Ambulatory Visit: Payer: Self-pay | Admitting: Interventional Cardiology

## 2011-04-19 ENCOUNTER — Encounter (HOSPITAL_COMMUNITY): Payer: Medicare Other | Attending: Interventional Cardiology

## 2011-04-19 DIAGNOSIS — E119 Type 2 diabetes mellitus without complications: Secondary | ICD-10-CM | POA: Insufficient documentation

## 2011-04-19 DIAGNOSIS — Z5189 Encounter for other specified aftercare: Secondary | ICD-10-CM | POA: Insufficient documentation

## 2011-04-19 DIAGNOSIS — I70209 Unspecified atherosclerosis of native arteries of extremities, unspecified extremity: Secondary | ICD-10-CM | POA: Insufficient documentation

## 2011-04-19 DIAGNOSIS — Z9861 Coronary angioplasty status: Secondary | ICD-10-CM | POA: Insufficient documentation

## 2011-04-19 DIAGNOSIS — I214 Non-ST elevation (NSTEMI) myocardial infarction: Secondary | ICD-10-CM | POA: Insufficient documentation

## 2011-04-19 DIAGNOSIS — I7092 Chronic total occlusion of artery of the extremities: Secondary | ICD-10-CM | POA: Insufficient documentation

## 2011-04-19 DIAGNOSIS — E785 Hyperlipidemia, unspecified: Secondary | ICD-10-CM | POA: Insufficient documentation

## 2011-04-19 DIAGNOSIS — I25810 Atherosclerosis of coronary artery bypass graft(s) without angina pectoris: Secondary | ICD-10-CM | POA: Insufficient documentation

## 2011-04-19 DIAGNOSIS — I10 Essential (primary) hypertension: Secondary | ICD-10-CM | POA: Insufficient documentation

## 2011-04-19 LAB — GLUCOSE, CAPILLARY: Glucose-Capillary: 227 mg/dL — ABNORMAL HIGH (ref 70–99)

## 2011-04-21 ENCOUNTER — Encounter (HOSPITAL_COMMUNITY): Payer: Medicare Other

## 2011-04-23 ENCOUNTER — Encounter (HOSPITAL_COMMUNITY): Payer: Medicare Other

## 2011-04-23 ENCOUNTER — Other Ambulatory Visit: Payer: Self-pay | Admitting: Interventional Cardiology

## 2011-04-23 LAB — GLUCOSE, CAPILLARY
Glucose-Capillary: 226 mg/dL — ABNORMAL HIGH (ref 70–99)
Glucose-Capillary: 120 mg/dL — ABNORMAL HIGH (ref 70–99)

## 2011-04-26 ENCOUNTER — Other Ambulatory Visit: Payer: Self-pay | Admitting: Interventional Cardiology

## 2011-04-26 ENCOUNTER — Encounter (HOSPITAL_COMMUNITY): Payer: Medicare Other

## 2011-04-26 LAB — GLUCOSE, CAPILLARY: Glucose-Capillary: 253 mg/dL — ABNORMAL HIGH (ref 70–99)

## 2011-04-28 ENCOUNTER — Encounter (HOSPITAL_COMMUNITY): Payer: Medicare Other

## 2011-04-30 ENCOUNTER — Encounter (HOSPITAL_COMMUNITY): Payer: Medicare Other

## 2011-05-03 ENCOUNTER — Other Ambulatory Visit: Payer: Self-pay | Admitting: Interventional Cardiology

## 2011-05-03 ENCOUNTER — Encounter (HOSPITAL_COMMUNITY): Payer: Medicare Other

## 2011-05-03 LAB — GLUCOSE, CAPILLARY: Glucose-Capillary: 113 mg/dL — ABNORMAL HIGH (ref 70–99)

## 2011-05-05 ENCOUNTER — Encounter (HOSPITAL_COMMUNITY): Payer: Medicare Other

## 2011-05-05 ENCOUNTER — Other Ambulatory Visit: Payer: Self-pay | Admitting: Interventional Cardiology

## 2011-05-05 LAB — GLUCOSE, CAPILLARY: Glucose-Capillary: 123 mg/dL — ABNORMAL HIGH (ref 70–99)

## 2011-05-07 ENCOUNTER — Encounter (HOSPITAL_COMMUNITY): Payer: Medicare Other

## 2011-05-07 ENCOUNTER — Other Ambulatory Visit: Payer: Self-pay | Admitting: Interventional Cardiology

## 2011-05-10 ENCOUNTER — Encounter (HOSPITAL_COMMUNITY): Payer: Medicare Other

## 2011-05-12 ENCOUNTER — Encounter (HOSPITAL_COMMUNITY): Payer: Medicare Other

## 2011-05-14 ENCOUNTER — Encounter (HOSPITAL_COMMUNITY): Payer: Medicare Other

## 2011-05-17 ENCOUNTER — Encounter (HOSPITAL_COMMUNITY): Payer: Medicare Other

## 2011-05-19 ENCOUNTER — Encounter (HOSPITAL_COMMUNITY): Payer: Medicare Other | Attending: Interventional Cardiology

## 2011-05-19 DIAGNOSIS — E785 Hyperlipidemia, unspecified: Secondary | ICD-10-CM | POA: Insufficient documentation

## 2011-05-19 DIAGNOSIS — I214 Non-ST elevation (NSTEMI) myocardial infarction: Secondary | ICD-10-CM | POA: Insufficient documentation

## 2011-05-19 DIAGNOSIS — Z9861 Coronary angioplasty status: Secondary | ICD-10-CM | POA: Insufficient documentation

## 2011-05-19 DIAGNOSIS — Z5189 Encounter for other specified aftercare: Secondary | ICD-10-CM | POA: Insufficient documentation

## 2011-05-19 DIAGNOSIS — E119 Type 2 diabetes mellitus without complications: Secondary | ICD-10-CM | POA: Insufficient documentation

## 2011-05-19 DIAGNOSIS — I7092 Chronic total occlusion of artery of the extremities: Secondary | ICD-10-CM | POA: Insufficient documentation

## 2011-05-19 DIAGNOSIS — I25810 Atherosclerosis of coronary artery bypass graft(s) without angina pectoris: Secondary | ICD-10-CM | POA: Insufficient documentation

## 2011-05-19 DIAGNOSIS — I10 Essential (primary) hypertension: Secondary | ICD-10-CM | POA: Insufficient documentation

## 2011-05-19 DIAGNOSIS — I70209 Unspecified atherosclerosis of native arteries of extremities, unspecified extremity: Secondary | ICD-10-CM | POA: Insufficient documentation

## 2011-05-21 ENCOUNTER — Encounter (HOSPITAL_COMMUNITY): Payer: Medicare Other

## 2011-05-24 ENCOUNTER — Encounter (HOSPITAL_COMMUNITY): Payer: Medicare Other

## 2011-05-26 ENCOUNTER — Encounter (HOSPITAL_COMMUNITY): Payer: Medicare Other

## 2011-05-28 ENCOUNTER — Encounter (HOSPITAL_COMMUNITY): Payer: Medicare Other

## 2011-05-31 ENCOUNTER — Encounter (HOSPITAL_COMMUNITY): Payer: Medicare Other

## 2011-06-02 ENCOUNTER — Other Ambulatory Visit: Payer: Self-pay | Admitting: Interventional Cardiology

## 2011-06-02 ENCOUNTER — Encounter (HOSPITAL_COMMUNITY): Payer: Medicare Other

## 2011-06-02 LAB — GLUCOSE, CAPILLARY: Glucose-Capillary: 94 mg/dL (ref 70–99)

## 2011-06-03 LAB — GLUCOSE, CAPILLARY: Glucose-Capillary: 61 mg/dL — ABNORMAL LOW (ref 70–99)

## 2011-06-04 ENCOUNTER — Other Ambulatory Visit: Payer: Self-pay | Admitting: Interventional Cardiology

## 2011-06-04 ENCOUNTER — Encounter (HOSPITAL_COMMUNITY): Payer: Medicare Other

## 2011-06-04 LAB — GLUCOSE, CAPILLARY
Glucose-Capillary: 143 mg/dL — ABNORMAL HIGH (ref 70–99)
Glucose-Capillary: 94 mg/dL (ref 70–99)

## 2011-06-07 ENCOUNTER — Other Ambulatory Visit: Payer: Self-pay | Admitting: Interventional Cardiology

## 2011-06-07 ENCOUNTER — Encounter (HOSPITAL_COMMUNITY): Payer: Medicare Other

## 2011-06-07 LAB — GLUCOSE, CAPILLARY: Glucose-Capillary: 177 mg/dL — ABNORMAL HIGH (ref 70–99)

## 2011-06-09 ENCOUNTER — Other Ambulatory Visit: Payer: Self-pay | Admitting: Interventional Cardiology

## 2011-06-09 ENCOUNTER — Encounter (HOSPITAL_COMMUNITY): Payer: Medicare Other

## 2011-06-09 LAB — GLUCOSE, CAPILLARY: Glucose-Capillary: 146 mg/dL — ABNORMAL HIGH (ref 70–99)

## 2011-06-11 ENCOUNTER — Encounter (HOSPITAL_COMMUNITY): Payer: Medicare Other

## 2011-06-14 ENCOUNTER — Encounter (HOSPITAL_COMMUNITY): Payer: Medicare Other

## 2011-06-14 ENCOUNTER — Other Ambulatory Visit: Payer: Self-pay | Admitting: Interventional Cardiology

## 2011-06-14 LAB — GLUCOSE, CAPILLARY
Glucose-Capillary: 81 mg/dL (ref 70–99)
Glucose-Capillary: 85 mg/dL (ref 70–99)

## 2011-06-16 ENCOUNTER — Other Ambulatory Visit: Payer: Self-pay | Admitting: Interventional Cardiology

## 2011-06-16 ENCOUNTER — Encounter (HOSPITAL_COMMUNITY): Payer: Medicare Other

## 2011-06-18 ENCOUNTER — Encounter (HOSPITAL_COMMUNITY): Payer: Medicare Other

## 2011-06-18 ENCOUNTER — Other Ambulatory Visit: Payer: Self-pay | Admitting: Interventional Cardiology

## 2011-06-18 LAB — GLUCOSE, CAPILLARY: Glucose-Capillary: 86 mg/dL (ref 70–99)

## 2011-06-21 ENCOUNTER — Encounter (HOSPITAL_COMMUNITY): Payer: Medicare Other

## 2011-06-23 ENCOUNTER — Encounter (HOSPITAL_COMMUNITY): Payer: Medicare Other | Attending: Interventional Cardiology

## 2011-06-23 ENCOUNTER — Other Ambulatory Visit: Payer: Self-pay | Admitting: Interventional Cardiology

## 2011-06-23 DIAGNOSIS — I70209 Unspecified atherosclerosis of native arteries of extremities, unspecified extremity: Secondary | ICD-10-CM | POA: Insufficient documentation

## 2011-06-23 DIAGNOSIS — I214 Non-ST elevation (NSTEMI) myocardial infarction: Secondary | ICD-10-CM | POA: Insufficient documentation

## 2011-06-23 DIAGNOSIS — E785 Hyperlipidemia, unspecified: Secondary | ICD-10-CM | POA: Insufficient documentation

## 2011-06-23 DIAGNOSIS — I25810 Atherosclerosis of coronary artery bypass graft(s) without angina pectoris: Secondary | ICD-10-CM | POA: Insufficient documentation

## 2011-06-23 DIAGNOSIS — I10 Essential (primary) hypertension: Secondary | ICD-10-CM | POA: Insufficient documentation

## 2011-06-23 DIAGNOSIS — I7092 Chronic total occlusion of artery of the extremities: Secondary | ICD-10-CM | POA: Insufficient documentation

## 2011-06-23 DIAGNOSIS — Z9861 Coronary angioplasty status: Secondary | ICD-10-CM | POA: Insufficient documentation

## 2011-06-23 DIAGNOSIS — Z5189 Encounter for other specified aftercare: Secondary | ICD-10-CM | POA: Insufficient documentation

## 2011-06-23 DIAGNOSIS — E119 Type 2 diabetes mellitus without complications: Secondary | ICD-10-CM | POA: Insufficient documentation

## 2011-06-25 ENCOUNTER — Other Ambulatory Visit: Payer: Self-pay | Admitting: Interventional Cardiology

## 2011-06-25 ENCOUNTER — Encounter (HOSPITAL_COMMUNITY): Payer: Medicare Other

## 2011-06-25 LAB — GLUCOSE, CAPILLARY
Glucose-Capillary: 109 mg/dL — ABNORMAL HIGH (ref 70–99)
Glucose-Capillary: 68 mg/dL — ABNORMAL LOW (ref 70–99)

## 2011-06-28 ENCOUNTER — Other Ambulatory Visit: Payer: Self-pay | Admitting: Interventional Cardiology

## 2011-06-28 ENCOUNTER — Encounter (HOSPITAL_COMMUNITY): Payer: Medicare Other

## 2011-06-30 ENCOUNTER — Other Ambulatory Visit: Payer: Self-pay | Admitting: Interventional Cardiology

## 2011-06-30 ENCOUNTER — Encounter (HOSPITAL_COMMUNITY): Payer: Medicare Other

## 2011-06-30 LAB — GLUCOSE, CAPILLARY: Glucose-Capillary: 95 mg/dL (ref 70–99)

## 2011-07-02 ENCOUNTER — Other Ambulatory Visit: Payer: Self-pay | Admitting: Interventional Cardiology

## 2011-07-02 ENCOUNTER — Encounter (HOSPITAL_COMMUNITY): Payer: Medicare Other

## 2011-07-02 LAB — GLUCOSE, CAPILLARY: Glucose-Capillary: 128 mg/dL — ABNORMAL HIGH (ref 70–99)

## 2011-07-05 ENCOUNTER — Other Ambulatory Visit: Payer: Self-pay | Admitting: Interventional Cardiology

## 2011-07-05 ENCOUNTER — Encounter (HOSPITAL_COMMUNITY): Payer: Medicare Other

## 2011-07-05 LAB — GLUCOSE, CAPILLARY: Glucose-Capillary: 163 mg/dL — ABNORMAL HIGH (ref 70–99)

## 2011-07-07 ENCOUNTER — Encounter (HOSPITAL_COMMUNITY): Payer: Medicare Other

## 2011-07-07 ENCOUNTER — Other Ambulatory Visit: Payer: Self-pay | Admitting: Interventional Cardiology

## 2011-07-09 ENCOUNTER — Other Ambulatory Visit: Payer: Self-pay | Admitting: Interventional Cardiology

## 2011-07-09 ENCOUNTER — Encounter (HOSPITAL_COMMUNITY): Payer: Medicare Other

## 2011-07-12 ENCOUNTER — Encounter (HOSPITAL_COMMUNITY): Payer: Medicare Other

## 2011-07-12 ENCOUNTER — Other Ambulatory Visit: Payer: Self-pay | Admitting: Interventional Cardiology

## 2011-07-14 ENCOUNTER — Encounter (HOSPITAL_COMMUNITY): Payer: Medicare Other

## 2011-07-15 LAB — CBC
HCT: 37.3 — ABNORMAL LOW
Hemoglobin: 12.8 — ABNORMAL LOW
MCHC: 34.3
MCHC: 34.5
MCV: 83.9
RDW: 14.5
RDW: 14.8

## 2011-07-15 LAB — POCT I-STAT, CHEM 8
Calcium, Ion: 1.25
Chloride: 105
Glucose, Bld: 116 — ABNORMAL HIGH
HCT: 43
Hemoglobin: 14.6

## 2011-07-15 LAB — CK TOTAL AND CKMB (NOT AT ARMC)
CK, MB: 6.1 — ABNORMAL HIGH
Total CK: 94

## 2011-07-15 LAB — LIPID PANEL
Cholesterol: 167
HDL: 24 — ABNORMAL LOW
LDL Cholesterol: 99
Total CHOL/HDL Ratio: 7
Triglycerides: 220 — ABNORMAL HIGH
VLDL: 44 — ABNORMAL HIGH

## 2011-07-15 LAB — BASIC METABOLIC PANEL
CO2: 28
Calcium: 8.7
Chloride: 104
Creatinine, Ser: 1.19
GFR calc Af Amer: >60
GFR calc non Af Amer: >60
Glucose, Bld: 120 — ABNORMAL HIGH
Glucose, Bld: 121 — ABNORMAL HIGH
Potassium: 4.5
Sodium: 140

## 2011-07-15 LAB — COMPREHENSIVE METABOLIC PANEL
Albumin: 3.3 — ABNORMAL LOW
Alkaline Phosphatase: 66
BUN: 14
Creatinine, Ser: 1.11
Glucose, Bld: 82
Potassium: 4.7
Total Protein: 6.3

## 2011-07-15 LAB — CARDIAC PANEL(CRET KIN+CKTOT+MB+TROPI)
CK, MB: 18.3 — ABNORMAL HIGH
Relative Index: INVALID
Troponin I: 0.29 — ABNORMAL HIGH

## 2011-07-15 LAB — PROTIME-INR
INR: 0.9
Prothrombin Time: 12.7

## 2011-07-15 LAB — POCT CARDIAC MARKERS
CKMB, poc: 1.7
Myoglobin, poc: 120
Operator id: 284251
Troponin i, poc: <0.05

## 2011-07-15 LAB — PLATELET COUNT: Platelets: 301

## 2011-07-16 ENCOUNTER — Encounter (HOSPITAL_COMMUNITY): Payer: Medicare Other

## 2011-11-05 DIAGNOSIS — N419 Inflammatory disease of prostate, unspecified: Secondary | ICD-10-CM | POA: Diagnosis not present

## 2011-11-24 DIAGNOSIS — E785 Hyperlipidemia, unspecified: Secondary | ICD-10-CM | POA: Diagnosis not present

## 2011-11-24 DIAGNOSIS — I739 Peripheral vascular disease, unspecified: Secondary | ICD-10-CM | POA: Diagnosis not present

## 2011-11-24 DIAGNOSIS — I6529 Occlusion and stenosis of unspecified carotid artery: Secondary | ICD-10-CM | POA: Diagnosis not present

## 2011-11-24 DIAGNOSIS — I10 Essential (primary) hypertension: Secondary | ICD-10-CM | POA: Diagnosis not present

## 2011-11-24 DIAGNOSIS — I2510 Atherosclerotic heart disease of native coronary artery without angina pectoris: Secondary | ICD-10-CM | POA: Diagnosis not present

## 2011-11-24 DIAGNOSIS — R0609 Other forms of dyspnea: Secondary | ICD-10-CM | POA: Diagnosis not present

## 2011-11-24 DIAGNOSIS — R0989 Other specified symptoms and signs involving the circulatory and respiratory systems: Secondary | ICD-10-CM | POA: Diagnosis not present

## 2011-11-29 DIAGNOSIS — G4733 Obstructive sleep apnea (adult) (pediatric): Secondary | ICD-10-CM | POA: Diagnosis not present

## 2011-12-13 DIAGNOSIS — R869 Unspecified abnormal finding in specimens from male genital organs: Secondary | ICD-10-CM | POA: Diagnosis not present

## 2011-12-13 DIAGNOSIS — Z961 Presence of intraocular lens: Secondary | ICD-10-CM | POA: Diagnosis not present

## 2011-12-13 DIAGNOSIS — E1139 Type 2 diabetes mellitus with other diabetic ophthalmic complication: Secondary | ICD-10-CM | POA: Diagnosis not present

## 2011-12-14 DIAGNOSIS — G4733 Obstructive sleep apnea (adult) (pediatric): Secondary | ICD-10-CM | POA: Diagnosis not present

## 2012-02-04 DIAGNOSIS — K219 Gastro-esophageal reflux disease without esophagitis: Secondary | ICD-10-CM | POA: Diagnosis not present

## 2012-02-04 DIAGNOSIS — R319 Hematuria, unspecified: Secondary | ICD-10-CM | POA: Diagnosis not present

## 2012-02-04 DIAGNOSIS — R809 Proteinuria, unspecified: Secondary | ICD-10-CM | POA: Diagnosis not present

## 2012-02-04 DIAGNOSIS — N390 Urinary tract infection, site not specified: Secondary | ICD-10-CM | POA: Diagnosis not present

## 2012-02-04 DIAGNOSIS — R822 Biliuria: Secondary | ICD-10-CM | POA: Diagnosis not present

## 2012-02-10 DIAGNOSIS — I10 Essential (primary) hypertension: Secondary | ICD-10-CM | POA: Diagnosis not present

## 2012-02-10 DIAGNOSIS — G4730 Sleep apnea, unspecified: Secondary | ICD-10-CM | POA: Diagnosis not present

## 2012-02-10 DIAGNOSIS — N433 Hydrocele, unspecified: Secondary | ICD-10-CM | POA: Diagnosis not present

## 2012-02-10 DIAGNOSIS — E119 Type 2 diabetes mellitus without complications: Secondary | ICD-10-CM | POA: Diagnosis not present

## 2012-02-10 DIAGNOSIS — I2510 Atherosclerotic heart disease of native coronary artery without angina pectoris: Secondary | ICD-10-CM | POA: Diagnosis not present

## 2012-02-10 DIAGNOSIS — E785 Hyperlipidemia, unspecified: Secondary | ICD-10-CM | POA: Diagnosis not present

## 2012-02-10 DIAGNOSIS — R310 Gross hematuria: Secondary | ICD-10-CM | POA: Diagnosis not present

## 2012-02-14 DIAGNOSIS — N3289 Other specified disorders of bladder: Secondary | ICD-10-CM | POA: Diagnosis not present

## 2012-02-14 DIAGNOSIS — R3129 Other microscopic hematuria: Secondary | ICD-10-CM | POA: Diagnosis not present

## 2012-02-14 DIAGNOSIS — R310 Gross hematuria: Secondary | ICD-10-CM | POA: Diagnosis not present

## 2012-02-14 DIAGNOSIS — N323 Diverticulum of bladder: Secondary | ICD-10-CM | POA: Diagnosis not present

## 2012-02-22 DIAGNOSIS — Z0000 Encounter for general adult medical examination without abnormal findings: Secondary | ICD-10-CM | POA: Diagnosis not present

## 2012-02-22 DIAGNOSIS — N312 Flaccid neuropathic bladder, not elsewhere classified: Secondary | ICD-10-CM | POA: Diagnosis not present

## 2012-02-22 DIAGNOSIS — R310 Gross hematuria: Secondary | ICD-10-CM | POA: Diagnosis not present

## 2012-02-22 DIAGNOSIS — N323 Diverticulum of bladder: Secondary | ICD-10-CM | POA: Diagnosis not present

## 2012-02-25 DIAGNOSIS — I10 Essential (primary) hypertension: Secondary | ICD-10-CM | POA: Diagnosis not present

## 2012-02-25 DIAGNOSIS — G4733 Obstructive sleep apnea (adult) (pediatric): Secondary | ICD-10-CM | POA: Diagnosis not present

## 2012-02-25 DIAGNOSIS — I2510 Atherosclerotic heart disease of native coronary artery without angina pectoris: Secondary | ICD-10-CM | POA: Diagnosis not present

## 2012-02-28 ENCOUNTER — Other Ambulatory Visit: Payer: Self-pay | Admitting: Family Medicine

## 2012-02-28 DIAGNOSIS — R1011 Right upper quadrant pain: Secondary | ICD-10-CM

## 2012-03-02 ENCOUNTER — Ambulatory Visit
Admission: RE | Admit: 2012-03-02 | Discharge: 2012-03-02 | Disposition: A | Discharge status: Home / Self Care | Payer: Medicare Other | Source: Ambulatory Visit | Attending: Family Medicine | Admitting: Family Medicine

## 2012-03-02 DIAGNOSIS — R1011 Right upper quadrant pain: Secondary | ICD-10-CM

## 2012-03-03 DIAGNOSIS — M949 Disorder of cartilage, unspecified: Secondary | ICD-10-CM | POA: Diagnosis not present

## 2012-03-03 DIAGNOSIS — M549 Dorsalgia, unspecified: Secondary | ICD-10-CM | POA: Diagnosis not present

## 2012-03-03 DIAGNOSIS — M899 Disorder of bone, unspecified: Secondary | ICD-10-CM | POA: Diagnosis not present

## 2012-03-20 ENCOUNTER — Other Ambulatory Visit: Payer: Self-pay | Admitting: Family Medicine

## 2012-03-20 ENCOUNTER — Ambulatory Visit
Admission: RE | Admit: 2012-03-20 | Discharge: 2012-03-20 | Disposition: A | Discharge status: Home / Self Care | Payer: Medicare Other | Source: Ambulatory Visit | Attending: Family Medicine | Admitting: Family Medicine

## 2012-03-20 DIAGNOSIS — R079 Chest pain, unspecified: Secondary | ICD-10-CM | POA: Diagnosis not present

## 2012-03-20 DIAGNOSIS — R911 Solitary pulmonary nodule: Secondary | ICD-10-CM | POA: Diagnosis not present

## 2012-03-20 DIAGNOSIS — I700 Atherosclerosis of aorta: Secondary | ICD-10-CM | POA: Diagnosis not present

## 2012-03-20 MED ORDER — IOHEXOL 300 MG/ML  SOLN
75.0000 mL | Freq: Once | INTRAMUSCULAR | Status: AC | PRN
  Administered 2012-03-20: 75 mL via INTRAVENOUS

## 2012-05-25 DIAGNOSIS — M549 Dorsalgia, unspecified: Secondary | ICD-10-CM | POA: Diagnosis not present

## 2012-06-26 DIAGNOSIS — I2510 Atherosclerotic heart disease of native coronary artery without angina pectoris: Secondary | ICD-10-CM | POA: Diagnosis not present

## 2012-06-26 DIAGNOSIS — I6529 Occlusion and stenosis of unspecified carotid artery: Secondary | ICD-10-CM | POA: Diagnosis not present

## 2012-06-26 DIAGNOSIS — I10 Essential (primary) hypertension: Secondary | ICD-10-CM | POA: Diagnosis not present

## 2012-06-26 DIAGNOSIS — E785 Hyperlipidemia, unspecified: Secondary | ICD-10-CM | POA: Diagnosis not present

## 2012-06-26 DIAGNOSIS — I739 Peripheral vascular disease, unspecified: Secondary | ICD-10-CM | POA: Diagnosis not present

## 2012-06-28 DIAGNOSIS — I739 Peripheral vascular disease, unspecified: Secondary | ICD-10-CM | POA: Diagnosis not present

## 2012-07-03 DIAGNOSIS — R350 Frequency of micturition: Secondary | ICD-10-CM | POA: Diagnosis not present

## 2012-07-03 DIAGNOSIS — N390 Urinary tract infection, site not specified: Secondary | ICD-10-CM | POA: Diagnosis not present

## 2012-07-10 DIAGNOSIS — R300 Dysuria: Secondary | ICD-10-CM | POA: Diagnosis not present

## 2012-07-10 DIAGNOSIS — N390 Urinary tract infection, site not specified: Secondary | ICD-10-CM | POA: Diagnosis not present

## 2012-07-10 DIAGNOSIS — R319 Hematuria, unspecified: Secondary | ICD-10-CM | POA: Diagnosis not present

## 2012-07-14 DIAGNOSIS — Z23 Encounter for immunization: Secondary | ICD-10-CM | POA: Diagnosis not present

## 2012-07-26 DIAGNOSIS — N3000 Acute cystitis without hematuria: Secondary | ICD-10-CM | POA: Diagnosis not present

## 2012-07-26 DIAGNOSIS — R339 Retention of urine, unspecified: Secondary | ICD-10-CM | POA: Diagnosis not present

## 2012-07-26 DIAGNOSIS — R3129 Other microscopic hematuria: Secondary | ICD-10-CM | POA: Diagnosis not present

## 2012-07-26 DIAGNOSIS — N323 Diverticulum of bladder: Secondary | ICD-10-CM | POA: Diagnosis not present

## 2012-08-09 DIAGNOSIS — N323 Diverticulum of bladder: Secondary | ICD-10-CM | POA: Diagnosis not present

## 2012-08-09 DIAGNOSIS — N312 Flaccid neuropathic bladder, not elsewhere classified: Secondary | ICD-10-CM | POA: Diagnosis not present

## 2012-08-09 DIAGNOSIS — R339 Retention of urine, unspecified: Secondary | ICD-10-CM | POA: Diagnosis not present

## 2012-08-09 DIAGNOSIS — R3129 Other microscopic hematuria: Secondary | ICD-10-CM | POA: Diagnosis not present

## 2012-08-10 DIAGNOSIS — G4730 Sleep apnea, unspecified: Secondary | ICD-10-CM | POA: Diagnosis not present

## 2012-08-10 DIAGNOSIS — E785 Hyperlipidemia, unspecified: Secondary | ICD-10-CM | POA: Diagnosis not present

## 2012-08-10 DIAGNOSIS — I10 Essential (primary) hypertension: Secondary | ICD-10-CM | POA: Diagnosis not present

## 2012-08-10 DIAGNOSIS — I2510 Atherosclerotic heart disease of native coronary artery without angina pectoris: Secondary | ICD-10-CM | POA: Diagnosis not present

## 2012-08-15 DIAGNOSIS — R339 Retention of urine, unspecified: Secondary | ICD-10-CM | POA: Diagnosis not present

## 2012-08-22 DIAGNOSIS — N312 Flaccid neuropathic bladder, not elsewhere classified: Secondary | ICD-10-CM | POA: Diagnosis not present

## 2012-08-22 DIAGNOSIS — R339 Retention of urine, unspecified: Secondary | ICD-10-CM | POA: Diagnosis not present

## 2012-09-06 DIAGNOSIS — E669 Obesity, unspecified: Secondary | ICD-10-CM | POA: Diagnosis not present

## 2012-09-06 DIAGNOSIS — I10 Essential (primary) hypertension: Secondary | ICD-10-CM | POA: Diagnosis not present

## 2012-09-06 DIAGNOSIS — G4733 Obstructive sleep apnea (adult) (pediatric): Secondary | ICD-10-CM | POA: Diagnosis not present

## 2012-10-20 DIAGNOSIS — H43399 Other vitreous opacities, unspecified eye: Secondary | ICD-10-CM | POA: Diagnosis not present

## 2012-10-20 DIAGNOSIS — E119 Type 2 diabetes mellitus without complications: Secondary | ICD-10-CM | POA: Diagnosis not present

## 2012-11-10 DIAGNOSIS — R310 Gross hematuria: Secondary | ICD-10-CM | POA: Diagnosis not present

## 2012-11-10 DIAGNOSIS — E785 Hyperlipidemia, unspecified: Secondary | ICD-10-CM | POA: Diagnosis not present

## 2012-11-10 DIAGNOSIS — E119 Type 2 diabetes mellitus without complications: Secondary | ICD-10-CM | POA: Diagnosis not present

## 2012-11-10 DIAGNOSIS — I10 Essential (primary) hypertension: Secondary | ICD-10-CM | POA: Diagnosis not present

## 2012-12-06 DIAGNOSIS — J069 Acute upper respiratory infection, unspecified: Secondary | ICD-10-CM | POA: Diagnosis not present

## 2013-02-06 ENCOUNTER — Ambulatory Visit (INDEPENDENT_AMBULATORY_CARE_PROVIDER_SITE_OTHER): Payer: Medicare Other | Admitting: Podiatry

## 2013-02-06 ENCOUNTER — Encounter: Payer: Self-pay | Admitting: Podiatry

## 2013-02-06 VITALS — BP 166/62 | HR 69 | Ht 66.0 in | Wt 166.0 lb

## 2013-02-06 DIAGNOSIS — E119 Type 2 diabetes mellitus without complications: Secondary | ICD-10-CM

## 2013-02-06 DIAGNOSIS — M21619 Bunion of unspecified foot: Secondary | ICD-10-CM

## 2013-02-06 DIAGNOSIS — M722 Plantar fascial fibromatosis: Secondary | ICD-10-CM | POA: Diagnosis not present

## 2013-02-06 DIAGNOSIS — E1159 Type 2 diabetes mellitus with other circulatory complications: Secondary | ICD-10-CM

## 2013-02-06 DIAGNOSIS — M21611 Bunion of right foot: Secondary | ICD-10-CM

## 2013-02-06 DIAGNOSIS — M216X9 Other acquired deformities of unspecified foot: Secondary | ICD-10-CM | POA: Insufficient documentation

## 2013-02-06 DIAGNOSIS — M21612 Bunion of left foot: Secondary | ICD-10-CM | POA: Insufficient documentation

## 2013-02-06 DIAGNOSIS — M216X2 Other acquired deformities of left foot: Secondary | ICD-10-CM

## 2013-02-06 NOTE — Patient Instructions (Signed)
Measured for diabetic shoes.  Will contact you when they are ready.

## 2013-02-06 NOTE — Progress Notes (Signed)
Subjective: 72 y.o. year old male patient presents for diabetic foot care and evaluation for diabetic shoes. He was fitted with diabetic shoes for the past 4-5 years to accommodate large bunion on both feet. Now it is time to have a new pair.   Review of Systems - General ROS: negative for - chills, fatigue, fever, sleep disturbance, weight gain, weight loss or except having night sweats sometimes, but not as bad as used to be.  Ophthalmic ROS: Wear glasses. ENT ROS: negative Wear hearing aids. Allergy and Immunology ROS: negative Hematological and Lymphatic ROS: negative Endocrine ROS: negative Respiratory ROS: negative Cardiovascular ROS: On Flavix and easily bleed. Was told to have some blockage at carotid artery.  Gastrointestinal ROS: no abdominal pain, change in bowel habits, or black or bloody stools Genito-Urinary ROS: Bladder does not work, and uses catheters. Musculoskeletal ROS: negative Neurological ROS: negative Dermatological ROS: negative  Objective: Dermatologic:  Normal skin without any lesions. Vascular: Pedal pulses are NOT palpable (both DP and PT). Been checked out by Vascular specialist and is being monitored. Orthopedic: Severe HAV with large bunion bilateral. Overlapping first and 2nd digit right.  Neurologic: All epicritic and tactile sensations grossly intact. Positive response to Monofilament testing and Vibratory sensation testing.   Assessment: 1. Type II NIDDM under control.(Last viisit A1C = 6.6) 2. Severe Hallux valgus with bunion deformity bilateral.  Plan: May benefit from Diabetic shoes to accommodate bunion deformity. Both feet measured for diabetic shoes.

## 2013-02-09 DIAGNOSIS — D649 Anemia, unspecified: Secondary | ICD-10-CM | POA: Diagnosis not present

## 2013-02-09 DIAGNOSIS — Z23 Encounter for immunization: Secondary | ICD-10-CM | POA: Diagnosis not present

## 2013-02-09 DIAGNOSIS — E119 Type 2 diabetes mellitus without complications: Secondary | ICD-10-CM | POA: Diagnosis not present

## 2013-02-09 DIAGNOSIS — E785 Hyperlipidemia, unspecified: Secondary | ICD-10-CM | POA: Diagnosis not present

## 2013-02-09 DIAGNOSIS — I779 Disorder of arteries and arterioles, unspecified: Secondary | ICD-10-CM | POA: Diagnosis not present

## 2013-02-09 DIAGNOSIS — I10 Essential (primary) hypertension: Secondary | ICD-10-CM | POA: Diagnosis not present

## 2013-02-21 ENCOUNTER — Other Ambulatory Visit: Payer: Self-pay | Admitting: Family Medicine

## 2013-02-21 DIAGNOSIS — R911 Solitary pulmonary nodule: Secondary | ICD-10-CM

## 2013-02-22 DIAGNOSIS — J329 Chronic sinusitis, unspecified: Secondary | ICD-10-CM | POA: Diagnosis not present

## 2013-03-14 DIAGNOSIS — I10 Essential (primary) hypertension: Secondary | ICD-10-CM | POA: Diagnosis not present

## 2013-03-14 DIAGNOSIS — E669 Obesity, unspecified: Secondary | ICD-10-CM | POA: Diagnosis not present

## 2013-03-14 DIAGNOSIS — G4733 Obstructive sleep apnea (adult) (pediatric): Secondary | ICD-10-CM | POA: Diagnosis not present

## 2013-03-20 ENCOUNTER — Ambulatory Visit
Admission: RE | Admit: 2013-03-20 | Discharge: 2013-03-20 | Disposition: A | Discharge status: Home / Self Care | Payer: Medicare Other | Source: Ambulatory Visit | Attending: Family Medicine | Admitting: Family Medicine

## 2013-03-20 ENCOUNTER — Other Ambulatory Visit: Payer: Medicare Other

## 2013-03-20 DIAGNOSIS — J984 Other disorders of lung: Secondary | ICD-10-CM | POA: Diagnosis not present

## 2013-03-20 DIAGNOSIS — R911 Solitary pulmonary nodule: Secondary | ICD-10-CM

## 2013-03-20 MED ORDER — IOHEXOL 300 MG/ML  SOLN
75.0000 mL | Freq: Once | INTRAMUSCULAR | Status: AC | PRN
  Administered 2013-03-20: 75 mL via INTRAVENOUS

## 2013-04-10 DIAGNOSIS — Z1211 Encounter for screening for malignant neoplasm of colon: Secondary | ICD-10-CM | POA: Diagnosis not present

## 2013-04-10 DIAGNOSIS — D129 Benign neoplasm of anus and anal canal: Secondary | ICD-10-CM | POA: Diagnosis not present

## 2013-04-10 DIAGNOSIS — K648 Other hemorrhoids: Secondary | ICD-10-CM | POA: Diagnosis not present

## 2013-04-10 DIAGNOSIS — D126 Benign neoplasm of colon, unspecified: Secondary | ICD-10-CM | POA: Diagnosis not present

## 2013-04-10 DIAGNOSIS — D128 Benign neoplasm of rectum: Secondary | ICD-10-CM | POA: Diagnosis not present

## 2013-05-08 ENCOUNTER — Ambulatory Visit: Payer: Medicare Other | Admitting: Podiatry

## 2013-05-11 ENCOUNTER — Ambulatory Visit: Payer: Medicare Other | Admitting: Podiatry

## 2013-06-01 DIAGNOSIS — Z85828 Personal history of other malignant neoplasm of skin: Secondary | ICD-10-CM | POA: Diagnosis not present

## 2013-06-01 DIAGNOSIS — L570 Actinic keratosis: Secondary | ICD-10-CM | POA: Diagnosis not present

## 2013-06-26 DIAGNOSIS — I10 Essential (primary) hypertension: Secondary | ICD-10-CM | POA: Diagnosis not present

## 2013-06-26 DIAGNOSIS — G4733 Obstructive sleep apnea (adult) (pediatric): Secondary | ICD-10-CM | POA: Diagnosis not present

## 2013-06-26 DIAGNOSIS — I2510 Atherosclerotic heart disease of native coronary artery without angina pectoris: Secondary | ICD-10-CM | POA: Diagnosis not present

## 2013-06-26 DIAGNOSIS — I779 Disorder of arteries and arterioles, unspecified: Secondary | ICD-10-CM | POA: Diagnosis not present

## 2013-06-26 DIAGNOSIS — I6529 Occlusion and stenosis of unspecified carotid artery: Secondary | ICD-10-CM | POA: Diagnosis not present

## 2013-06-26 DIAGNOSIS — E785 Hyperlipidemia, unspecified: Secondary | ICD-10-CM | POA: Diagnosis not present

## 2013-06-27 DIAGNOSIS — I6529 Occlusion and stenosis of unspecified carotid artery: Secondary | ICD-10-CM | POA: Diagnosis not present

## 2013-06-27 DIAGNOSIS — I779 Disorder of arteries and arterioles, unspecified: Secondary | ICD-10-CM | POA: Diagnosis not present

## 2013-07-19 ENCOUNTER — Other Ambulatory Visit: Payer: Self-pay

## 2013-07-19 MED ORDER — VALSARTAN 320 MG PO TABS: 320.0000 mg | ORAL_TABLET | Freq: Every day | ORAL | Status: DC

## 2013-07-24 ENCOUNTER — Other Ambulatory Visit: Payer: Self-pay

## 2013-07-24 MED ORDER — VALSARTAN 320 MG PO TABS: 320.0000 mg | ORAL_TABLET | Freq: Every day | ORAL | Status: DC

## 2013-07-30 ENCOUNTER — Ambulatory Visit (INDEPENDENT_AMBULATORY_CARE_PROVIDER_SITE_OTHER): Payer: Medicare Other | Admitting: Interventional Cardiology

## 2013-07-30 ENCOUNTER — Encounter: Payer: Self-pay | Admitting: Interventional Cardiology

## 2013-07-30 VITALS — BP 146/60 | HR 62 | Ht 66.0 in | Wt 168.0 lb

## 2013-07-30 DIAGNOSIS — I70219 Atherosclerosis of native arteries of extremities with intermittent claudication, unspecified extremity: Secondary | ICD-10-CM | POA: Insufficient documentation

## 2013-07-30 DIAGNOSIS — E785 Hyperlipidemia, unspecified: Secondary | ICD-10-CM | POA: Diagnosis not present

## 2013-07-30 DIAGNOSIS — I6523 Occlusion and stenosis of bilateral carotid arteries: Secondary | ICD-10-CM

## 2013-07-30 DIAGNOSIS — I779 Disorder of arteries and arterioles, unspecified: Secondary | ICD-10-CM | POA: Insufficient documentation

## 2013-07-30 DIAGNOSIS — I6529 Occlusion and stenosis of unspecified carotid artery: Secondary | ICD-10-CM

## 2013-07-30 DIAGNOSIS — E119 Type 2 diabetes mellitus without complications: Secondary | ICD-10-CM | POA: Insufficient documentation

## 2013-07-30 DIAGNOSIS — F17200 Nicotine dependence, unspecified, uncomplicated: Secondary | ICD-10-CM

## 2013-07-30 DIAGNOSIS — I2510 Atherosclerotic heart disease of native coronary artery without angina pectoris: Secondary | ICD-10-CM | POA: Insufficient documentation

## 2013-07-30 NOTE — Patient Instructions (Signed)
Your physician recommends that you schedule a follow-up appointment in: 4-6 weeks with Dr. Eldridge Dace.  Your physician recommends that you continue on your current medications as directed. Please refer to the Current Medication list given to you today.

## 2013-07-30 NOTE — Progress Notes (Signed)
Patient ID: [name], male   DOB: June 06, 1941, 72 y.o.   MRN: [id number]    [address] [place], [place]  [address] Phone: [phone] Fax:  [phone]  Date:  07/30/2013   ID:  [name], DOB [birth year], MRN [id number]  PCP:  Johny Blamer, MD      History of Present Illness: [name] is a 72 y.o. male with extensive h/o CAD and PAD.  He has known bilateral SFA occlusions since 2001.  He had a left external iliac artery stent in 2001.  He had several cardiac stents since.  He has progressive bilateral caludication.  He can walk a block before e gets pain.  He will continue to walk for a little longer but has to stop.  He was told many years ago by Dr. [name] that bilateral fem-pop bypass surgery was the treatment.  It was not recommended.    No nonhealing sores.  He can remember in 2012 with cardiac rehab, that his leg pain got better.  Now, he feels that he can hardly walk at all.       Wt Readings from Last 3 Encounters:  07/30/13 168 lb (76.204 kg)  02/06/13 166 lb (75.297 kg)     No past medical history on file.  Current Outpatient Prescriptions  Medication Sig Dispense Refill  . amLODipine (NORVASC) 10 MG tablet Take 10 mg by mouth daily.       Marland Kitchen aspirin 325 MG EC tablet Take 325 mg by mouth daily.      Marland Kitchen atorvastatin (LIPITOR) 20 MG tablet Take 20 mg by mouth daily.       . cholecalciferol (VITAMIN D) 1000 UNITS tablet Take 1,000 Units by mouth daily.      . clopidogrel (PLAVIX) 75 MG tablet Take 75 mg by mouth daily.       Marland Kitchen GLIPIZIDE XL 5 MG 24 hr tablet Take 5 mg by mouth daily.       . metFORMIN (GLUCOPHAGE) 1000 MG tablet Take 1,000 mg by mouth 2 (two) times daily with a meal.       . metoprolol succinate (TOPROL-XL) 50 MG 24 hr tablet Take 50 mg by mouth daily.       Marland Kitchen NITROSTAT 0.4 MG SL tablet Place 0.4 mg under the tongue every 5 (five) minutes as needed.       Marland Kitchen omeprazole (PRILOSEC) 20 MG capsule Take 20 mg by mouth daily.       . valsartan (DIOVAN) 320 MG tablet Take 1 tablet (320 mg total) by mouth daily.  90 tablet  3   No current facility-administered medications for this visit.    Allergies:   No Known Allergies  Social History:  The patient  reports that he has been smoking.  He does not have any smokeless tobacco history on file.   Family History:  The patient's family history includes CAD in his mother; Cirrhosis in his brother.   ROS:  Please see the history of present illness.  No nausea, vomiting.  No fevers, chills.  No focal weakness.  No dysuria. Worsening claudication.   All other systems reviewed and negative.   PHYSICAL EXAM: VS:  BP 146/60  Pulse 62  Ht 5\' 6"  (1.676 m)  Wt 168 lb (76.204 kg)  BMI 27.13 kg/m2 Well nourished, well developed, in no acute distress HEENT: normal Neck: no JVD, 4/6 left carotid bruit Cardiac:  normal S1, S2; RRR;  Lungs:  clear to auscultation bilaterally, no wheezing, rhonchi or rales Abd: soft, nontender, no hepatomegaly Ext: no edema, tr DP bilaterally, 1+ femoral pulses bilaterally Skin: warm and dry Neuro:   no focal abnormalities noted    ASSESSMENT AND PLAN:  1. PAD: Reviewed his carotid Doppler and lower extremity arterial Dopplers. Reviewed his procedure note from 2001 performed by Dr. [name]. He has known bilateral SFA occlusions documented on the 2001 angiogram. He had a left external iliac self-expanding stent placed at that time. Based on his exam, I suspect he may have worsening of his air iliac occlusive disease. In combination with his SFA occlusions, this likely has caused his worsening claudication symptoms. I've asked him to try to exercise more. Ideally he should get 5 days a week 30 minutes today. We also went over symptoms are more severe ischemia, such as nonhealing sores. If he gets any type of sore on his foot that does not heal, he should contact us immediately. We discussed adding Pletal but he is already on aspirin and Plavix at  this time.  He will contact us if symptoms get worse.  Since his SFA occlusions are so old, success rates with percutaneous interventions will be lower.  If there is new iliac disease, which appears to be the case based on his exam, revascularization of that area may be the way to improve his symptoms.  If after trying increase walking for the next 4-6 weeks, his symptoms are no better, we'll plan for lower extremity arterial angiography. 2. Tobacco abuse: He recently stopped smoking within the past few weeks. I encouraged him to continue to abstain as this is the best thing he can do for his leg circulation as well as his heart. 3. Carotid artery disease: He has a moderate left internal carotid stenosis. He has a very significant bruit on that side. We'll have to set him up for a followup ultrasound in March of 2015. 4. Hyperlipidemia: Continue atorvastatin. This will be beneficial for his peripheral circulation as well.  Signed, Fredric Mare, MD, [facility] 07/30/2013 9:16 AM

## 2013-08-09 DIAGNOSIS — I10 Essential (primary) hypertension: Secondary | ICD-10-CM | POA: Diagnosis not present

## 2013-08-09 DIAGNOSIS — E119 Type 2 diabetes mellitus without complications: Secondary | ICD-10-CM | POA: Diagnosis not present

## 2013-08-09 DIAGNOSIS — I739 Peripheral vascular disease, unspecified: Secondary | ICD-10-CM | POA: Diagnosis not present

## 2013-08-09 DIAGNOSIS — E785 Hyperlipidemia, unspecified: Secondary | ICD-10-CM | POA: Diagnosis not present

## 2013-08-09 DIAGNOSIS — Z23 Encounter for immunization: Secondary | ICD-10-CM | POA: Diagnosis not present

## 2013-08-09 DIAGNOSIS — R61 Generalized hyperhidrosis: Secondary | ICD-10-CM | POA: Diagnosis not present

## 2013-08-15 ENCOUNTER — Other Ambulatory Visit: Payer: Self-pay

## 2013-08-15 MED ORDER — METOPROLOL SUCCINATE ER 50 MG PO TB24: 50.0000 mg | ORAL_TABLET | Freq: Every day | ORAL | Status: DC

## 2013-08-15 MED ORDER — CLOPIDOGREL BISULFATE 75 MG PO TABS: 75.0000 mg | ORAL_TABLET | Freq: Every day | ORAL | Status: DC

## 2013-08-23 ENCOUNTER — Other Ambulatory Visit: Payer: Self-pay

## 2013-08-23 DIAGNOSIS — I6529 Occlusion and stenosis of unspecified carotid artery: Secondary | ICD-10-CM | POA: Diagnosis not present

## 2013-08-23 DIAGNOSIS — R700 Elevated erythrocyte sedimentation rate: Secondary | ICD-10-CM | POA: Diagnosis not present

## 2013-08-23 DIAGNOSIS — R61 Generalized hyperhidrosis: Secondary | ICD-10-CM | POA: Diagnosis not present

## 2013-08-27 ENCOUNTER — Encounter: Payer: Self-pay | Admitting: Interventional Cardiology

## 2013-08-27 ENCOUNTER — Ambulatory Visit (INDEPENDENT_AMBULATORY_CARE_PROVIDER_SITE_OTHER): Payer: Medicare Other | Admitting: Interventional Cardiology

## 2013-08-27 VITALS — BP 148/60 | HR 77 | Ht 66.0 in | Wt 169.0 lb

## 2013-08-27 DIAGNOSIS — I70219 Atherosclerosis of native arteries of extremities with intermittent claudication, unspecified extremity: Secondary | ICD-10-CM

## 2013-08-27 NOTE — Patient Instructions (Signed)
Your physician wants you to follow-up in: 1 year with Dr. Varanasi. You will receive a reminder letter in the mail two months in advance. If you don't receive a letter, please call our office to schedule the follow-up appointment.  Your physician recommends that you continue on your current medications as directed. Please refer to the Current Medication list given to you today.  

## 2013-08-27 NOTE — Progress Notes (Signed)
Patient ID: [name], male   DOB: 1941-08-12, 72 y.o.   MRN: [id number] Patient ID: [name], male   DOB: [birth year], 72 y.o.   MRN: [id number]    [address] [place], [place]  [address] Phone: [phone] Fax:  [phone]  Date:  08/27/2013   ID:  [name], DOB November 08, 1940, MRN [id number]  PCP:  Johny Blamer, MD      History of Present Illness: [name] is a 72 y.o. male with extensive h/o CAD and PAD.  He has known bilateral SFA occlusions since 2001.  He had a left external iliac artery stent in 2001.  He had several cardiac stents since.  He has progressive bilateral caludication.  He could walk a block before he gets pain.  He will continue to walk for a little longer but has to stop.  He was told many years ago by Dr. [name] that bilateral fem-pop bypass surgery was the treatment.  It was not recommended.  Since our last visit, he has increased the duration of walking that he does on the treadmill.  No nonhealing sores.  He can remember in 2012 with cardiac rehab, that his leg pain got better.  Now, he feels that he can hardly walk at all.       Wt Readings from Last 3 Encounters:  08/27/13 169 lb (76.658 kg)  07/30/13 168 lb (76.204 kg)  02/06/13 166 lb (75.297 kg)     Past Medical History  Diagnosis Date  . Carotid artery occlusion     Bilateral 50-69% stenoses of the internal carotid arteries. He needs carotid Dopplers every 6 months.  . Coronary artery disease     CABG in 1999. Stent in SVG to RCA in 2012 complicated by no reflow  . Diabetes mellitus without complication   . Hyperlipidemia   . Atherosclerosis of native arteries of the extremities with intermittent claudication Left external iliac artery stent in 2001    Bilateral SFA occlusions documented in 2001.    Current Outpatient Prescriptions  Medication Sig Dispense Refill  . amLODipine (NORVASC) 10 MG tablet Take 10 mg by mouth daily.       Marland Kitchen aspirin 325 MG EC  tablet Take 325 mg by mouth daily.      Marland Kitchen atorvastatin (LIPITOR) 20 MG tablet Take 20 mg by mouth daily.       . cholecalciferol (VITAMIN D) 1000 UNITS tablet Take 1,000 Units by mouth daily.      . clopidogrel (PLAVIX) 75 MG tablet Take 1 tablet (75 mg total) by mouth daily.  90 tablet  3  . GLIPIZIDE XL 5 MG 24 hr tablet Take 5 mg by mouth daily.       . metFORMIN (GLUCOPHAGE) 1000 MG tablet Take 1,000 mg by mouth 2 (two) times daily with a meal.       . metoprolol succinate (TOPROL-XL) 50 MG 24 hr tablet Take 1 tablet (50 mg total) by mouth daily.  90 tablet  3  . NITROSTAT 0.4 MG SL tablet Place 0.4 mg under the tongue every 5 (five) minutes as needed.       Marland Kitchen omeprazole (PRILOSEC) 20 MG capsule Take 20 mg by mouth daily.      . valsartan (DIOVAN) 320 MG tablet Take 1 tablet (320 mg total) by mouth daily.  90 tablet  3   No current facility-administered medications for this visit.    Allergies:   No  Known Allergies  Social History:  The patient  reports that he has quit smoking. He does not have any smokeless tobacco history on file.   Family History:  The patient's family history includes CAD in his mother; Cirrhosis in his brother.   ROS:  Please see the history of present illness.  No nausea, vomiting.  No fevers, chills.  No focal weakness.  No dysuria. Worsening claudication.   All other systems reviewed and negative.   PHYSICAL EXAM: VS:  BP 148/60  Pulse 77  Ht 5\' 6"  (1.676 m)  Wt 169 lb (76.658 kg)  BMI 27.29 kg/m2  SpO2 99% Well nourished, well developed, in no acute distress HEENT: normal Neck: no JVD, 4/6 left carotid bruit Cardiac:  normal S1, S2; RRR;  Lungs:  clear to auscultation bilaterally, no wheezing, rhonchi or rales Abd: soft, nontender, no hepatomegaly Ext: no edema, tr DP bilaterally, 1+ femoral pulses bilaterally Skin: warm and dry Neuro:   no focal abnormalities noted    ASSESSMENT AND PLAN:  1. PAD: Reviewed his carotid Doppler and lower  extremity arterial Dopplers. Reviewed his procedure note from 2001 performed by Dr. [name]. He has known bilateral SFA occlusions documented on the 2001 angiogram. He had a left external iliac self-expanding stent placed at that time. Based on his exam, I suspect he may have worsening of his aortoiliac occlusive disease. In combination with his SFA occlusions, this likely has caused his worsening claudication symptoms. I've asked him to try to exercise more. Ideally he should get 5 days a week 30 minutes today. We also went over symptoms are more severe ischemia, such as nonhealing sores. If he gets any type of sore on his foot that does not heal, he should contact us immediately. We discussed adding Pletal but he is already on aspirin and Plavix at this time.  He will contact us if symptoms get worse.  Since his SFA occlusions are so old, success rates with percutaneous interventions will be lower.  If there is new iliac disease, which appears to be the case based on his exam, revascularization of that area may be the way to improve his symptoms.         After a month of more walking, he feels he is increase the duration of walking prior claudication from 10-15 minutes. He is good to stick with a walking program. At this point, I would not plan for angiography given the improvement in his walking duration. If his symptoms change, he'll let us no 2. Tobacco abuse: He recently stopped smoking within the past 6 weeks. I encouraged him to continue to abstain as this is the best thing he can do for his leg circulation as well as his heart. 3. Carotid artery disease: He has a moderate left internal carotid stenosis. He has a very significant bruit on that side. We'll have to set him up for a followup ultrasound in March of 2015. 4. Hyperlipidemia: Continue atorvastatin. This will be beneficial for his peripheral circulation as well.  Signed, Fredric Mare, MD, [facility] 08/27/2013 10:19 AM

## 2013-09-12 ENCOUNTER — Ambulatory Visit (INDEPENDENT_AMBULATORY_CARE_PROVIDER_SITE_OTHER): Payer: Medicare Other | Admitting: Cardiology

## 2013-09-12 ENCOUNTER — Ambulatory Visit: Payer: Medicare Other | Admitting: Cardiology

## 2013-09-12 ENCOUNTER — Encounter: Payer: Self-pay | Admitting: Cardiology

## 2013-09-12 VITALS — BP 154/58 | HR 70 | Ht 66.0 in | Wt 168.8 lb

## 2013-09-12 DIAGNOSIS — I119 Hypertensive heart disease without heart failure: Secondary | ICD-10-CM | POA: Insufficient documentation

## 2013-09-12 DIAGNOSIS — I10 Essential (primary) hypertension: Secondary | ICD-10-CM | POA: Diagnosis not present

## 2013-09-12 DIAGNOSIS — G4733 Obstructive sleep apnea (adult) (pediatric): Secondary | ICD-10-CM

## 2013-09-12 NOTE — Patient Instructions (Signed)
Your physician recommends that you continue on your current medications as directed. Please refer to the Current Medication list given to you today.  Your physician wants you to follow-up in: 6 Months with Dr Turner You will receive a reminder letter in the mail two months in advance. If you don't receive a letter, please call our office to schedule the follow-up appointment.  

## 2013-09-12 NOTE — Progress Notes (Signed)
[address] [place], [place]  [address] Phone: [phone] Fax:  [phone]  Date:  09/12/2013   ID:  WANG GRANADA, DOB 1941-07-20, MRN [id number]  PCP:  Johny Blamer, MD  Cardiologist:  Verdis Prime, MD  Sleep Medicine:  Armanda Magic, MD   History of Present Illness: [name] is a 72 y.o. male with a history of OSA and HTN who presents today for followup.  He tolerates his CPAP device well.  He uses a nasal mask with no chin strap and tolerates it well.  He feels the pressure is adequate.  He naps during the day because he wakes up a lot at night.  He goes to bed at 8 and gets up at 4am.   Wt Readings from Last 3 Encounters:  09/12/13 168 lb 12.8 oz (76.567 kg)  08/27/13 169 lb (76.658 kg)  07/30/13 168 lb (76.204 kg)     Past Medical History  Diagnosis Date  . Carotid artery occlusion     Bilateral 50-69% stenoses of the internal carotid arteries. He needs carotid Dopplers every 6 months.  . Coronary artery disease     CABG in 1999. Stent in SVG to RCA in 2012 complicated by no reflow  . Diabetes mellitus without complication   . Hyperlipidemia   . Atherosclerosis of native arteries of the extremities with intermittent claudication Left external iliac artery stent in 2001    Bilateral SFA occlusions documented in 2001.  Marland Kitchen OSA (obstructive sleep apnea)   . Hypertension   . GERD (gastroesophageal reflux disease)     Current Outpatient Prescriptions  Medication Sig Dispense Refill  . amLODipine (NORVASC) 10 MG tablet Take 10 mg by mouth daily.       Marland Kitchen aspirin 325 MG EC tablet Take 325 mg by mouth daily.      Marland Kitchen atorvastatin (LIPITOR) 20 MG tablet Take 20 mg by mouth daily.       . cholecalciferol (VITAMIN D) 1000 UNITS tablet Take 1,000 Units by mouth daily.      . clopidogrel (PLAVIX) 75 MG tablet Take 1 tablet (75 mg total) by mouth daily.  90 tablet  3  . GLIPIZIDE XL 5 MG 24 hr tablet Take 5 mg by mouth daily.       . metFORMIN (GLUCOPHAGE)  1000 MG tablet Take 1,000 mg by mouth 2 (two) times daily with a meal.       . metoprolol succinate (TOPROL-XL) 50 MG 24 hr tablet Take 1 tablet (50 mg total) by mouth daily.  90 tablet  3  . NITROSTAT 0.4 MG SL tablet Place 0.4 mg under the tongue every 5 (five) minutes as needed.       Marland Kitchen omeprazole (PRILOSEC) 20 MG capsule Take 20 mg by mouth daily.      . valsartan-hydrochlorothiazide (DIOVAN-HCT) 320-12.5 MG per tablet Take 1 tablet by mouth daily.       No current facility-administered medications for this visit.    Allergies:   No Known Allergies  Social History:  The patient  reports that he has quit smoking. He does not have any smokeless tobacco history on file. He reports that he does not drink alcohol or use illicit drugs.   Family History:  The patient's family history includes CAD in his mother; Cirrhosis in his brother.   ROS:  Please see the history of present illness.      All other systems reviewed and negative.   PHYSICAL  EXAM: VS:  BP 185/50  Pulse 70  Ht 5\' 6"  (1.676 m)  Wt 168 lb 12.8 oz (76.567 kg)  BMI 27.26 kg/m2 Well nourished, well developed, in no acute distress HEENT: normal Neck: no JVD. Bilateral carotid artery bruits Cardiac:  normal S1, S2; RRR; no murmur Lungs:  clear to auscultation bilaterally, no wheezing, rhonchi or rales Abd: soft, nontender, no hepatomegaly Ext: no edema Skin: warm and dry Neuro:  CNs 2-12 intact, no focal abnormalities noted       ASSESSMENT AND PLAN:  1. OSA on CPAP with download today showing an AHI of 1.2/hr and 78% compliance in using more than 4 hours nightly. He will continue on his current settings. 2. HTN with elevated BP today  - I have asked him to check his BP daily for a week and call with the results  Followup with me in 6 months  Signed, Armanda Magic, MD 09/12/2013 10:02 AM

## 2013-09-19 DIAGNOSIS — J40 Bronchitis, not specified as acute or chronic: Secondary | ICD-10-CM | POA: Diagnosis not present

## 2013-09-24 ENCOUNTER — Inpatient Hospital Stay (HOSPITAL_COMMUNITY)
Admission: EM | Admit: 2013-09-24 | Discharge: 2013-09-27 | DRG: 281 | Disposition: A | Discharge status: Home / Self Care | Payer: Medicare Other | Attending: Interventional Cardiology | Admitting: Interventional Cardiology

## 2013-09-24 ENCOUNTER — Emergency Department (HOSPITAL_COMMUNITY): Payer: Medicare Other

## 2013-09-24 ENCOUNTER — Encounter (HOSPITAL_COMMUNITY): Payer: Self-pay | Admitting: Emergency Medicine

## 2013-09-24 DIAGNOSIS — I129 Hypertensive chronic kidney disease with stage 1 through stage 4 chronic kidney disease, or unspecified chronic kidney disease: Secondary | ICD-10-CM | POA: Diagnosis present

## 2013-09-24 DIAGNOSIS — Z7982 Long term (current) use of aspirin: Secondary | ICD-10-CM

## 2013-09-24 DIAGNOSIS — K219 Gastro-esophageal reflux disease without esophagitis: Secondary | ICD-10-CM | POA: Diagnosis present

## 2013-09-24 DIAGNOSIS — K222 Esophageal obstruction: Secondary | ICD-10-CM | POA: Diagnosis present

## 2013-09-24 DIAGNOSIS — I5033 Acute on chronic diastolic (congestive) heart failure: Principal | ICD-10-CM | POA: Diagnosis present

## 2013-09-24 DIAGNOSIS — E118 Type 2 diabetes mellitus with unspecified complications: Secondary | ICD-10-CM | POA: Diagnosis not present

## 2013-09-24 DIAGNOSIS — J449 Chronic obstructive pulmonary disease, unspecified: Secondary | ICD-10-CM | POA: Diagnosis present

## 2013-09-24 DIAGNOSIS — R079 Chest pain, unspecified: Secondary | ICD-10-CM | POA: Diagnosis not present

## 2013-09-24 DIAGNOSIS — I214 Non-ST elevation (NSTEMI) myocardial infarction: Secondary | ICD-10-CM | POA: Diagnosis present

## 2013-09-24 DIAGNOSIS — I25709 Atherosclerosis of coronary artery bypass graft(s), unspecified, with unspecified angina pectoris: Secondary | ICD-10-CM

## 2013-09-24 DIAGNOSIS — I739 Peripheral vascular disease, unspecified: Secondary | ICD-10-CM | POA: Diagnosis not present

## 2013-09-24 DIAGNOSIS — N312 Flaccid neuropathic bladder, not elsewhere classified: Secondary | ICD-10-CM | POA: Diagnosis not present

## 2013-09-24 DIAGNOSIS — E785 Hyperlipidemia, unspecified: Secondary | ICD-10-CM | POA: Diagnosis present

## 2013-09-24 DIAGNOSIS — E669 Obesity, unspecified: Secondary | ICD-10-CM | POA: Diagnosis present

## 2013-09-24 DIAGNOSIS — Z6825 Body mass index (BMI) 25.0-25.9, adult: Secondary | ICD-10-CM

## 2013-09-24 DIAGNOSIS — Z9861 Coronary angioplasty status: Secondary | ICD-10-CM

## 2013-09-24 DIAGNOSIS — N183 Chronic kidney disease, stage 3 unspecified: Secondary | ICD-10-CM | POA: Diagnosis present

## 2013-09-24 DIAGNOSIS — G4730 Sleep apnea, unspecified: Secondary | ICD-10-CM | POA: Diagnosis present

## 2013-09-24 DIAGNOSIS — I2489 Other forms of acute ischemic heart disease: Secondary | ICD-10-CM | POA: Diagnosis present

## 2013-09-24 DIAGNOSIS — I248 Other forms of acute ischemic heart disease: Secondary | ICD-10-CM | POA: Diagnosis present

## 2013-09-24 DIAGNOSIS — Z8589 Personal history of malignant neoplasm of other organs and systems: Secondary | ICD-10-CM

## 2013-09-24 DIAGNOSIS — G8929 Other chronic pain: Secondary | ICD-10-CM | POA: Diagnosis present

## 2013-09-24 DIAGNOSIS — I519 Heart disease, unspecified: Secondary | ICD-10-CM

## 2013-09-24 DIAGNOSIS — E1159 Type 2 diabetes mellitus with other circulatory complications: Secondary | ICD-10-CM

## 2013-09-24 DIAGNOSIS — M549 Dorsalgia, unspecified: Secondary | ICD-10-CM | POA: Diagnosis present

## 2013-09-24 DIAGNOSIS — I679 Cerebrovascular disease, unspecified: Secondary | ICD-10-CM | POA: Diagnosis present

## 2013-09-24 DIAGNOSIS — I200 Unstable angina: Secondary | ICD-10-CM

## 2013-09-24 DIAGNOSIS — I5032 Chronic diastolic (congestive) heart failure: Secondary | ICD-10-CM

## 2013-09-24 DIAGNOSIS — I2510 Atherosclerotic heart disease of native coronary artery without angina pectoris: Secondary | ICD-10-CM

## 2013-09-24 DIAGNOSIS — J90 Pleural effusion, not elsewhere classified: Secondary | ICD-10-CM | POA: Diagnosis not present

## 2013-09-24 DIAGNOSIS — I5031 Acute diastolic (congestive) heart failure: Secondary | ICD-10-CM | POA: Diagnosis not present

## 2013-09-24 DIAGNOSIS — I252 Old myocardial infarction: Secondary | ICD-10-CM | POA: Diagnosis present

## 2013-09-24 DIAGNOSIS — E119 Type 2 diabetes mellitus without complications: Secondary | ICD-10-CM | POA: Diagnosis present

## 2013-09-24 DIAGNOSIS — Z951 Presence of aortocoronary bypass graft: Secondary | ICD-10-CM

## 2013-09-24 DIAGNOSIS — J4489 Other specified chronic obstructive pulmonary disease: Secondary | ICD-10-CM | POA: Diagnosis present

## 2013-09-24 DIAGNOSIS — R0602 Shortness of breath: Secondary | ICD-10-CM | POA: Diagnosis not present

## 2013-09-24 HISTORY — DX: Acute myocardial infarction, unspecified: I21.9

## 2013-09-24 HISTORY — DX: Flaccid neuropathic bladder, not elsewhere classified: N31.2

## 2013-09-24 HISTORY — DX: Atherosclerotic heart disease of native coronary artery without angina pectoris: I25.10

## 2013-09-24 HISTORY — DX: Chronic obstructive pulmonary disease, unspecified: J44.9

## 2013-09-24 HISTORY — DX: Presence of aortocoronary bypass graft: Z95.1

## 2013-09-24 HISTORY — DX: Esophageal obstruction: K22.2

## 2013-09-24 LAB — POCT I-STAT, CHEM 8
Calcium, Ion: 1.2 mmol/L (ref 1.13–1.30)
Creatinine, Ser: 1.5 mg/dL — ABNORMAL HIGH (ref 0.50–1.35)
Glucose, Bld: 217 mg/dL — ABNORMAL HIGH (ref 70–99)
HCT: 33 % — ABNORMAL LOW (ref 39.0–52.0)
Hemoglobin: 11.2 g/dL — ABNORMAL LOW (ref 13.0–17.0)
Potassium: 4.4 mEq/L (ref 3.5–5.1)
TCO2: 26 mmol/L (ref 0–100)

## 2013-09-24 LAB — COMPREHENSIVE METABOLIC PANEL
ALT: 24 U/L (ref 0–53)
AST: 19 U/L (ref 0–37)
CO2: 27 mEq/L (ref 19–32)
Calcium: 8.9 mg/dL (ref 8.4–10.5)
Chloride: 99 mEq/L (ref 96–112)
GFR calc non Af Amer: 56 mL/min — ABNORMAL LOW (ref >90)
Glucose, Bld: 203 mg/dL — ABNORMAL HIGH (ref 70–99)
Sodium: 136 mEq/L (ref 135–145)
Total Protein: 6.3 g/dL (ref 6.0–8.3)

## 2013-09-24 LAB — HEPARIN LEVEL (UNFRACTIONATED): Heparin Unfractionated: 0.19 IU/mL — ABNORMAL LOW (ref 0.30–0.70)

## 2013-09-24 LAB — CBC WITH DIFFERENTIAL/PLATELET
Basophils Absolute: 0 10*3/uL (ref 0.0–0.1)
Basophils Relative: 0 % (ref 0–1)
Eosinophils Absolute: 0.1 10*3/uL (ref 0.0–0.7)
Hemoglobin: 10.7 g/dL — ABNORMAL LOW (ref 13.0–17.0)
MCH: 26.5 pg (ref 26.0–34.0)
MCHC: 33.2 g/dL (ref 30.0–36.0)
MCV: 79.7 fL (ref 78.0–100.0)
Monocytes Absolute: 1.3 10*3/uL — ABNORMAL HIGH (ref 0.1–1.0)
Monocytes Relative: 9 % (ref 3–12)
Neutrophils Relative %: 78 % — ABNORMAL HIGH (ref 43–77)
Platelets: 306 10*3/uL (ref 150–400)
RDW: 15 % (ref 11.5–15.5)

## 2013-09-24 LAB — POCT I-STAT TROPONIN I: Troponin i, poc: 0.34 ng/mL (ref 0.00–0.08)

## 2013-09-24 LAB — DIFFERENTIAL
Basophils Absolute: 0 10*3/uL (ref 0.0–0.1)
Basophils Relative: 0 % (ref 0–1)
Eosinophils Relative: 0 % (ref 0–5)
Lymphocytes Relative: 5 % — ABNORMAL LOW (ref 12–46)
Lymphs Abs: 0.8 10*3/uL (ref 0.7–4.0)
Monocytes Absolute: 1.3 10*3/uL — ABNORMAL HIGH (ref 0.1–1.0)
Neutro Abs: 13.1 10*3/uL — ABNORMAL HIGH (ref 1.7–7.7)

## 2013-09-24 LAB — HEMOGLOBIN A1C: Mean Plasma Glucose: 166 mg/dL — ABNORMAL HIGH (ref <117)

## 2013-09-24 LAB — CBC
MCH: 26.4 pg (ref 26.0–34.0)
MCHC: 33.3 g/dL (ref 30.0–36.0)
Platelets: 309 10*3/uL (ref 150–400)
RBC: 4.01 MIL/uL — ABNORMAL LOW (ref 4.22–5.81)

## 2013-09-24 LAB — GLUCOSE, CAPILLARY

## 2013-09-24 MED ORDER — INSULIN ASPART 100 UNIT/ML ~~LOC~~ SOLN: 0.0000 [IU] | Freq: Every day | SUBCUTANEOUS | Status: DC

## 2013-09-24 MED ORDER — ACETAMINOPHEN 325 MG PO TABS: 650.0000 mg | ORAL_TABLET | Freq: Four times a day (QID) | ORAL | Status: DC | PRN

## 2013-09-24 MED ORDER — GLIPIZIDE ER 5 MG PO TB24
5.0000 mg | ORAL_TABLET | Freq: Every day | ORAL | Status: DC
  Administered 2013-09-25 – 2013-09-27 (×3): 5 mg via ORAL
  Filled 2013-09-24 (×4): qty 1

## 2013-09-24 MED ORDER — METOPROLOL SUCCINATE ER 50 MG PO TB24
50.0000 mg | ORAL_TABLET | Freq: Every day | ORAL | Status: DC
Start: 2013-09-24 — End: 2013-09-27
  Administered 2013-09-24 – 2013-09-27 (×4): 50 mg via ORAL
  Filled 2013-09-24 (×4): qty 1

## 2013-09-24 MED ORDER — PANTOPRAZOLE SODIUM 40 MG PO TBEC
40.0000 mg | DELAYED_RELEASE_TABLET | Freq: Every day | ORAL | Status: DC
  Administered 2013-09-24 – 2013-09-27 (×4): 40 mg via ORAL
  Filled 2013-09-24 (×4): qty 1

## 2013-09-24 MED ORDER — HEPARIN BOLUS VIA INFUSION
1500.0000 [IU] | Freq: Once | INTRAVENOUS | Status: AC
  Administered 2013-09-24: 1500 [IU] via INTRAVENOUS
  Filled 2013-09-24: qty 1500

## 2013-09-24 MED ORDER — FUROSEMIDE 10 MG/ML IJ SOLN
40.0000 mg | Freq: Three times a day (TID) | INTRAMUSCULAR | Status: AC
  Administered 2013-09-24 (×3): 40 mg via INTRAVENOUS
  Filled 2013-09-24 (×3): qty 4

## 2013-09-24 MED ORDER — NITROGLYCERIN 0.4 MG SL SUBL: 0.4000 mg | SUBLINGUAL_TABLET | SUBLINGUAL | Status: DC | PRN

## 2013-09-24 MED ORDER — METFORMIN HCL 500 MG PO TABS: 1000.0000 mg | ORAL_TABLET | Freq: Two times a day (BID) | ORAL | Status: DC

## 2013-09-24 MED ORDER — IRBESARTAN 300 MG PO TABS
300.0000 mg | ORAL_TABLET | Freq: Every day | ORAL | Status: DC
  Administered 2013-09-24 – 2013-09-27 (×4): 300 mg via ORAL
  Filled 2013-09-24 (×4): qty 1

## 2013-09-24 MED ORDER — VITAMIN D3 25 MCG (1000 UNIT) PO TABS
1000.0000 [IU] | ORAL_TABLET | Freq: Every day | ORAL | Status: DC
  Administered 2013-09-24 – 2013-09-27 (×4): 1000 [IU] via ORAL
  Filled 2013-09-24 (×4): qty 1

## 2013-09-24 MED ORDER — HEPARIN BOLUS VIA INFUSION
4000.0000 [IU] | Freq: Once | INTRAVENOUS | Status: AC
  Administered 2013-09-24: 4000 [IU] via INTRAVENOUS
  Filled 2013-09-24: qty 4000

## 2013-09-24 MED ORDER — HEPARIN (PORCINE) IN NACL 100-0.45 UNIT/ML-% IJ SOLN
1800.0000 [IU]/h | INTRAMUSCULAR | Status: DC
  Administered 2013-09-24: 23:00:00 1400 [IU]/h via INTRAVENOUS
  Administered 2013-09-25: 1550 [IU]/h via INTRAVENOUS
  Administered 2013-09-26: 06:00:00 1800 [IU]/h via INTRAVENOUS
  Filled 2013-09-24 (×5): qty 250

## 2013-09-24 MED ORDER — ATORVASTATIN CALCIUM 20 MG PO TABS
20.0000 mg | ORAL_TABLET | Freq: Every day | ORAL | Status: DC
  Administered 2013-09-24 – 2013-09-27 (×4): 20 mg via ORAL
  Filled 2013-09-24 (×4): qty 1

## 2013-09-24 MED ORDER — CLOPIDOGREL BISULFATE 75 MG PO TABS
75.0000 mg | ORAL_TABLET | Freq: Every day | ORAL | Status: DC
  Administered 2013-09-25 – 2013-09-27 (×3): 75 mg via ORAL
  Filled 2013-09-24 (×4): qty 1

## 2013-09-24 MED ORDER — ASPIRIN EC 325 MG PO TBEC
325.0000 mg | DELAYED_RELEASE_TABLET | Freq: Every day | ORAL | Status: DC
  Administered 2013-09-24 – 2013-09-27 (×4): 325 mg via ORAL
  Filled 2013-09-24 (×4): qty 1

## 2013-09-24 MED ORDER — INSULIN ASPART 100 UNIT/ML ~~LOC~~ SOLN
0.0000 [IU] | Freq: Three times a day (TID) | SUBCUTANEOUS | Status: DC
  Administered 2013-09-25: 1 [IU] via SUBCUTANEOUS
  Administered 2013-09-25: 2 [IU] via SUBCUTANEOUS
  Administered 2013-09-26: 1 [IU] via SUBCUTANEOUS
  Administered 2013-09-26: 12:00:00 3 [IU] via SUBCUTANEOUS
  Administered 2013-09-26: 2 [IU] via SUBCUTANEOUS
  Administered 2013-09-27: 1 [IU] via SUBCUTANEOUS

## 2013-09-24 MED ORDER — AMLODIPINE BESYLATE 10 MG PO TABS
10.0000 mg | ORAL_TABLET | Freq: Every day | ORAL | Status: DC
  Administered 2013-09-24 – 2013-09-26 (×3): 10 mg via ORAL
  Filled 2013-09-24 (×4): qty 1

## 2013-09-24 MED ORDER — HEPARIN (PORCINE) IN NACL 100-0.45 UNIT/ML-% IJ SOLN
1250.0000 [IU]/h | INTRAMUSCULAR | Status: DC
  Administered 2013-09-24: 1000 [IU]/h via INTRAVENOUS
  Administered 2013-09-24: 1250 [IU]/h via INTRAVENOUS
  Filled 2013-09-24 (×3): qty 250

## 2013-09-24 NOTE — Progress Notes (Signed)
ANTICOAGULATION CONSULT NOTE - Follow Up Consult  Pharmacy Consult for Heparin Indication: NSTEMI  No Known Allergies  Patient Measurements: Height: 5\' 6"  (167.6 cm) Weight: 166 lb 7.2 oz (75.5 kg) (scale B) IBW/kg (Calculated) : 63.8 Heparin Dosing Weight: 75.5kg  Vital Signs: Temp: 97.5 F (36.4 C) (12/08 0807) Temp src: Oral (12/08 0807) BP: 151/72 mmHg (12/08 0807) Pulse Rate: 87 (12/08 0807)  Labs:  Recent Labs  09/24/13 0355 09/24/13 0425 09/24/13 0730 09/24/13 1230  HGB 10.6* 11.2* 10.7*  --   HCT 31.8* 33.0* 32.2*  --   PLT 309  --  306  --   HEPARINUNFRC  --   --   --  0.19*  CREATININE 1.25 1.50*  --   --     Estimated Creatinine Clearance: 40.2 ml/min (by C-G formula based on Cr of 1.5).   Medications:  Heparin 1000 units/hr  Assessment: 72yom on heparin for NSTEMI. Heparin level (0.19) is subtherapeutic - will increase rate and check follow-up heparin level. - H/H low, Plts wnl - No significant bleeding reported - No problems with infusion/line  Goal of Therapy:  Heparin level 0.3-0.7 units/ml Monitor platelets by anticoagulation protocol: Yes   Plan:  1. Heparin IV bolus 1500 units x 1 2. Increase heparin drip to 1250 units/hr (12.5 ml/hr) 3. Check heparin level 8 hours after rate increase 4. Daily heparin level and CBC  Cleon Dew [phone] 09/24/2013,1:32 PM

## 2013-09-24 NOTE — ED Notes (Signed)
Pt arrived from home via [facility] c/o intermittent CP x 3 days. PCP recently diagnosed pt with bronchitis. Productive cough with yellow sputum.

## 2013-09-24 NOTE — Progress Notes (Signed)
ANTICOAGULATION CONSULT NOTE - Initial Consult  Pharmacy Consult for heparin Indication: NSTEMI  No Known Allergies  Patient Measurements: Height: 5\' 6"  (167.6 cm) Weight: 171 lb (77.565 kg) IBW/kg (Calculated) : 63.8  Vital Signs: Temp: 98 F (36.7 C) (12/08 0500) Temp src: Oral (12/08 0500) BP: 155/68 mmHg (12/08 0500) Pulse Rate: 79 (12/08 0500)  Labs:  Recent Labs  09/24/13 0355  HGB 10.6*  HCT 31.8*  PLT 309  CREATININE 1.25    Estimated Creatinine Clearance: 52.4 ml/min (by C-G formula based on Cr of 1.25).   Medical History: Past Medical History  Diagnosis Date  . MI (myocardial infarction)     Assessment: 72yo male c/o intermittent CP x2 days similar to severe indigestion and similar to prior MI, pain became more severe overnight, now with elevated troponin, to begin heparin.  Goal of Therapy:  Heparin level 0.3-0.7 units/ml Monitor platelets by anticoagulation protocol: Yes   Plan:  Will give heparin 4000 units IV bolus x1 followed by gtt at 1000 units/hr and monitor heparin levels and CBC.  Vernard Gambles, PharmD, BCPS  09/24/2013,5:45 AM

## 2013-09-24 NOTE — ED Notes (Signed)
I stat troponin results given to Dr. Read Drivers by B. Bing Plume EMT

## 2013-09-24 NOTE — H&P (Signed)
[name] is a 72 y.o. male  Admit Date: 09/24/2013 Referring Physician: Leonides Sake, MD Primary Cardiologist:: HWBSmith, III, MD Chief complaint / reason for admission: Dyspnea and chest discomfort  HPI: 72 year old gentleman with a long history of coronary artery disease, prior coronary bypass grafting, peripheral vascular disease, COPD, and cerebrovascular disease. He was recently treated by his primary physician for bronchitis. He's been having difficulty with breathing for greater than a month. Over the past 72 hours, he has had associated chest tightness. Dyspnea and chest tightness are worse when he tries to lay down. He has never before had orthopnea. He denies lower extremity edema. No prolonged episodes of chest discomfort but they have increased in frequency despite being responsive to nitroglycerin.    PMH:    Medical History: Coronary atherosclerotic heart disease with CABG 1999, LIMA to LAD, sequential SVG to second and third diagonal, sequential SVG to acute marginal, PDA, and PL branch., non-ST elevation MI 2006, PTCA of SVG to RCA; Taxus DES mid circumflex 2006; DES SVG to PDA May 2007; restenting with DES 04/2008, Dyslipidemia, Hypertension -[facility], Diabetes mellitus, type II- [facility] (08-04-2011), Tobacco abuse, Back pain, chronic, Esophageal reflux, s/p esophageal stricture dilatation, Peripheral vascular disease, s/p stent, Erectile dysfunction, Childhood bone marrow cancer, s/p radiation, BPH, hypotonic bladder, 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 Vernie Ammons, Sleep apnea, Obesity, Hyperlipidemia, Dysphagia, Abdominal pain, Bronchitis, Heart attacks x 5, Uses a cather to urinate.   Past Medical History  Diagnosis Date  . MI (myocardial infarction)   . Coronary artery disease   . Non-STEMI (non-ST elevated myocardial infarction) 09/24/2013  . 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 1999    . CAD (coronary artery disease), native coronary artery 09/24/2013    Native circumflex Taxus stent 2007, angioplasty of the SVG to native right coronary 2006, DES stent to the SVG of RCA 2007 and resplinted 2009    . COPD (chronic obstructive pulmonary disease) 09/24/2013  . PVD (peripheral vascular disease) 09/24/2013  . Diabetes mellitus with complication 09/24/2013  . PAD (peripheral artery disease)   . History of urinary retention     has to perform self catheterizations  . 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 Esophageal stricture 09/24/2013    History of dilatation. Long-standing history of esophageal reflux     PSH:    Past Surgical History  Procedure Laterality Date  . Coronary stent placement    . Coronary artery bypass graft  1999  . Cardiac catheterization     ALLERGIES:   Review of patient's allergies indicates no known allergies. Prior to Admit Meds:   Prescriptions prior to admission  Medication Sig Dispense Refill  . acetaminophen (TYLENOL) 325 MG tablet Take 650 mg by mouth every 6 (six) hours as needed for moderate pain.      Marland Kitchen amLODipine (NORVASC) 10 MG tablet Take 10 mg by mouth daily.      Marland Kitchen aspirin EC 325 MG tablet Take 325 mg by mouth daily.      Marland Kitchen atorvastatin (LIPITOR) 20 MG tablet Take 20 mg by mouth daily.      . cholecalciferol (VITAMIN D) 1000 UNITS tablet Take 1,000 Units by mouth daily.      . clopidogrel (PLAVIX) 75 MG tablet Take 75 mg by mouth daily with breakfast.      .  glipiZIDE (GLUCOTROL XL) 5 MG 24 hr tablet Take 5 mg by mouth daily with breakfast.      . metFORMIN (GLUCOPHAGE) 1000 MG tablet Take 1,000 mg by mouth 2 (two) times daily with a meal.      . metoprolol succinate (TOPROL-XL) 50 MG 24 hr tablet Take 50 mg by mouth daily. Take with or immediately following a meal.      . nitroGLYCERIN (NITROSTAT) 0.4 MG SL tablet Place 0.4 mg under the tongue every 5 (five) minutes as  needed for chest pain.      Marland Kitchen omeprazole (PRILOSEC) 20 MG capsule Take 20 mg by mouth daily.      . valsartan-hydrochlorothiazide (DIOVAN-HCT) 320-12.5 MG per tablet Take 1 tablet by mouth daily.       Family HX:   History reviewed. No pertinent family history. Social HX:    History   Social History  . Marital Status: Married    Spouse Name: N/A    Number of Children: N/A  . Years of Education: N/A   Occupational History  . Not on file.   Social History Main Topics  . Smoking status: Former Smoker -- 1.00 packs/day for 50 years    Quit date: 07/02/2013  . Smokeless tobacco: Never Used  . Alcohol Use: No  . Drug Use: No  . Sexual Activity: Not on file   Other Topics Concern  . Not on file   Social History Narrative  . No narrative on file     ROS: COPD. Continued cigarette smoker except on 07/02/2013 he states he stopped. Recent upper respiratory illness. Denies hemoptysis. He has hypotonic bladder and is unable to go to bathroom without straight cathing himself. Denies weight loss. Appetite is been stable. Exertional tolerance is significantly diminished.  Physical Exam: Blood pressure 140/42, pulse 86, temperature 97.7 F (36.5 C), temperature source Oral, resp. rate 19, height 5\' 6"  (1.676 m), weight 166 lb 7.2 oz (75.5 kg), SpO2 93.00%.   Appears chronically ill but in no acute distress HEENT exam reveals ruddy facial complexion. Neck exam reveals moderate JVD with the patient sitting at 90 Chest reveals scattered rhonchi and diminished breath sounds. Rales are heard at the bases. Cardiac exam reveals an S4 gallop. 2 of 6 systolic murmur at the apex. Abdomen is obese. Abdomen is nontender. Abdominal bruits are heard. Extremities reveal no edema. Femoral pulses are 1+ bilateral. Bruits are heard. Pedal pulses are diminished. Neurological exam is unremarkable. Labs: Lab Results  Component Value Date   WBC 14.9* 09/24/2013   HGB 10.7* 09/24/2013   HCT 32.2* 09/24/2013    MCV 79.7 09/24/2013   PLT 306 09/24/2013     Recent Labs Lab 09/24/13 0355 09/24/13 0425  NA 136 140  K 4.3 4.4  CL 99 100  CO2 27  --   BUN 22 22  CREATININE 1.25 1.50*  CALCIUM 8.9  --   PROT 6.3  --   BILITOT 0.3  --   ALKPHOS 72  --   ALT 24  --   AST 19  --   GLUCOSE 203* 217*   No results found for this basename: CKTOTAL,  CKMB,  CKMBINDEX,  TROPONINI   BNP    Component Value Date/Time   PROBNP 5410.0* 09/24/2013 0355     Radiology:  EXAM:  CHEST 2 VIEW  COMPARISON: None.  FINDINGS:  The lungs are well-aerated. Mild peribronchial thickening is noted.  Basilar opacity is suggested on the lateral view; this may  reflect  atelectasis or possibly mild pneumonia. There is no evidence of   pleural effusion or pneumothorax.  The heart is normal in size; the patient is status post median  sternotomy, with evidence of prior CABG. Cervical spinal fusion  hardware is noted. No acute osseous abnormalities are seen.  IMPRESSION:  Basilar opacity suggested on the lateral view; this may reflect  atelectasis or possibly mild pneumonia. Mild peribronchial  thickening noted.  Electronically Signed  By: Roanna Raider M.D.  On: 09/24/2013 04:15   EKG:  Right bundle with precordial T-wave abnormality  ASSESSMENT:  1. Acute on chronic diastolic heart failure possibly ischemia mediated versus a stress of having current pulmonary infection  2.  Acute coronary syndrome, possibly demand related versus progression of native and/or bypass graft disease  3. COPD  4. Possible upper respiratory/pulmonary infection. He just completed a course of azithromycin.  Plan:  1. IV diuresis 2. Culture phlegm if productive. 3. Cycle markers 4. 2 D doppler echo 5. May need repeat cath.   Lesleigh Noe 09/24/2013 2:48 PM

## 2013-09-24 NOTE — Progress Notes (Signed)
  Echocardiogram 2D Echocardiogram has been performed.  Cathie Beams 09/24/2013, 2:38 PM

## 2013-09-24 NOTE — Progress Notes (Signed)
ANTICOAGULATION CONSULT: HEPARIN INDICATION: NSTEMI  Heparin level =0.24 on 1250 units/hr Goal heparin level = 0.3-0.7  Heparin level is subtherapeutic. Will increase the rate to 1400 units/hr and f/u the heparin level with morning labs.  Cardell Peach, PharmD

## 2013-09-24 NOTE — ED Provider Notes (Addendum)
CSN: [id number]     Arrival date & time 09/24/13  0300 History   First MD Initiated Contact with Patient 09/24/13 [phone]     Chief Complaint  Patient presents with  . Chest Pain   (Consider location/radiation/quality/duration/timing/severity/associated sxs/prior Treatment) HPI This is a 72 year old male with a history of coronary artery disease. He was diagnosed with bronchitis last week and placed on azithromycin. He is here with 2 days of intermittent chest pain which he describes as "severe indigestion". This is characterized as like the pain of previous myocardial infarctions. It worsened overnight it became severe about 2:30 this morning. It was accompanied by shortness of breath but no diaphoresis. EMS was called and they administered multiple sublingual nitroglycerin tablets with eventual relief of the pain. He did experience some transient nausea. At the present time he is no longer having this pain but he states he is having a little sternal soreness that hurts when he breathes.   Past Medical History  Diagnosis Date  . MI (myocardial infarction)    Past Surgical History  Procedure Laterality Date  . Coronary stent placement     No family history on file. History  Substance Use Topics  . Smoking status: Not on file  . Smokeless tobacco: Not on file  . Alcohol Use: Not on file    Review of Systems  All other systems reviewed and are negative.    Allergies  Review of patient's allergies indicates no known allergies.  Home Medications   Current Outpatient Rx  Name  Route  Sig  Dispense  Refill  . acetaminophen (TYLENOL) 325 MG tablet   Oral   Take 650 mg by mouth every 6 (six) hours as needed for moderate pain.         Marland Kitchen amLODipine (NORVASC) 10 MG tablet   Oral   Take 10 mg by mouth daily.         Marland Kitchen aspirin EC 325 MG tablet   Oral   Take 325 mg by mouth daily.         Marland Kitchen atorvastatin (LIPITOR) 20 MG tablet   Oral   Take 20 mg by mouth daily.         .  cholecalciferol (VITAMIN D) 1000 UNITS tablet   Oral   Take 1,000 Units by mouth daily.         . clopidogrel (PLAVIX) 75 MG tablet   Oral   Take 75 mg by mouth daily with breakfast.         . glipiZIDE (GLUCOTROL XL) 5 MG 24 hr tablet   Oral   Take 5 mg by mouth daily with breakfast.         . metFORMIN (GLUCOPHAGE) 1000 MG tablet   Oral   Take 1,000 mg by mouth 2 (two) times daily with a meal.         . metoprolol succinate (TOPROL-XL) 50 MG 24 hr tablet   Oral   Take 50 mg by mouth daily. Take with or immediately following a meal.         . nitroGLYCERIN (NITROSTAT) 0.4 MG SL tablet   Sublingual   Place 0.4 mg under the tongue every 5 (five) minutes as needed for chest pain.         Marland Kitchen omeprazole (PRILOSEC) 20 MG capsule   Oral   Take 20 mg by mouth daily.         . valsartan-hydrochlorothiazide (DIOVAN-HCT) 320-12.5 MG per tablet   Oral  Take 1 tablet by mouth daily.          BP 151/63  Pulse 80  Temp(Src) 98 F (36.7 C) (Oral)  Resp 22  Ht 5\' 6"  (1.676 m)  Wt 171 lb (77.565 kg)  BMI 27.61 kg/m2  SpO2 94%  Physical Exam General: Well-developed, well-nourished male in no acute distress; appearance consistent with age of record HENT: normocephalic; atraumatic Eyes: pupils equal, round and reactive to light; extraocular muscles intact Neck: supple Heart: regular rate and rhythm Lungs: A few basilar rales Chest: Mild sternal tenderness that reproduces the soreness mentioned in the HPI. Abdomen: soft; nondistended; nontender; no masses or hepatosplenomegaly; bowel sounds present Extremities: No deformity; full range of motion; pulses normal Neurologic: Awake, alert and oriented; motor function intact in all extremities and symmetric; no facial droop Skin: Warm and dry Psychiatric: Normal mood and affect    ED Course  Procedures (including critical care time)     MDM  Nursing notes and vitals signs, including pulse oximetry,  reviewed.  Summary of this visit's results, reviewed by myself:  Labs:  Results for orders placed during the hospital encounter of 09/24/13 (from the past 24 hour(s))  CBC     Status: Abnormal   Collection Time    09/24/13  3:55 AM      Result Value Range   WBC 15.5 (*) 4.0 - 10.5 K/uL   RBC 4.01 (*) 4.22 - 5.81 MIL/uL   Hemoglobin 10.6 (*) 13.0 - 17.0 g/dL   HCT 09.8 (*) 11.9 - 14.7 %   MCV 79.3  78.0 - 100.0 fL   MCH 26.4  26.0 - 34.0 pg   MCHC 33.3  30.0 - 36.0 g/dL   RDW 82.9  56.2 - 13.0 %   Platelets 309  150 - 400 K/uL  COMPREHENSIVE METABOLIC PANEL     Status: Abnormal   Collection Time    09/24/13  3:55 AM      Result Value Range   Sodium 136  135 - 145 mEq/L   Potassium 4.3  3.5 - 5.1 mEq/L   Chloride 99  96 - 112 mEq/L   CO2 27  19 - 32 mEq/L   Glucose, Bld 203 (*) 70 - 99 mg/dL   BUN 22  6 - 23 mg/dL   Creatinine, Ser 8.65  0.50 - 1.35 mg/dL   Calcium 8.9  8.4 - 78.4 mg/dL   Total Protein 6.3  6.0 - 8.3 g/dL   Albumin 2.6 (*) 3.5 - 5.2 g/dL   AST 19  0 - 37 U/L   ALT 24  0 - 53 U/L   Alkaline Phosphatase 72  39 - 117 U/L   Total Bilirubin 0.3  0.3 - 1.2 mg/dL   GFR calc non Af Amer 56 (*) >90 mL/min   GFR calc Af Amer 65 (*) >90 mL/min  PRO B NATRIURETIC PEPTIDE     Status: Abnormal   Collection Time    09/24/13  3:55 AM      Result Value Range   Pro B Natriuretic peptide (BNP) 5410.0 (*) 0 - 125 pg/mL  DIFFERENTIAL     Status: Abnormal   Collection Time    09/24/13  3:55 AM      Result Value Range   Neutrophils Relative % 86 (*) 43 - 77 %   Neutro Abs 13.1 (*) 1.7 - 7.7 K/uL   Lymphocytes Relative 5 (*) 12 - 46 %   Lymphs Abs 0.8  0.7 -  4.0 K/uL   Monocytes Relative 9  3 - 12 %   Monocytes Absolute 1.3 (*) 0.1 - 1.0 K/uL   Eosinophils Relative 0  0 - 5 %   Eosinophils Absolute 0.0  0.0 - 0.7 K/uL   Basophils Relative 0  0 - 1 %   Basophils Absolute 0.0  0.0 - 0.1 K/uL  POCT I-STAT TROPONIN I     Status: Abnormal   Collection Time    09/24/13   5:13 AM      Result Value Range   Troponin i, poc 0.44 (*) 0.00 - 0.08 ng/mL   Comment NOTIFIED PHYSICIAN     Comment 3             Imaging Studies: Dg Chest 2 View  09/24/2013   CLINICAL DATA:  Chest pain and shortness of breath.  EXAM: CHEST  2 VIEW  COMPARISON:  None.  FINDINGS: The lungs are well-aerated. Mild peribronchial thickening is noted. Basilar opacity is suggested on the lateral view; this may reflect atelectasis or possibly mild pneumonia. There is no evidence of pleural effusion or pneumothorax.  The heart is normal in size; the patient is status post median sternotomy, with evidence of prior CABG. Cervical spinal fusion hardware is noted. No acute osseous abnormalities are seen.  IMPRESSION: Basilar opacity suggested on the lateral view; this may reflect atelectasis or possibly mild pneumonia. Mild peribronchial thickening noted.   Electronically Signed   By: Roanna Raider M.D.   On: 09/24/2013 04:15    EKG Interpretation:  Date & Time: 09/24/2013 3:23 AM  Rate: 90  Rhythm: normal sinus rhythm  QRS Axis: normal  Intervals: normal  ST/T Wave abnormalities: Diffuse T-wave inversions  Conduction Disutrbances: RBBB, LPFB  Narrative Interpretation:   Old EKG Reviewed: none available  Discussed with Dr. Tresa Endo of Cardiology. The patient will be admitted to Dr. Corky Sing service. Heparin ordered per Pharmacy.     Hanley Seamen, MD 09/24/13 9604  Hanley Seamen, MD 09/24/13 [phone]

## 2013-09-24 NOTE — Progress Notes (Signed)
Utilization Review Completed.[name] T12/05/2013  

## 2013-09-25 DIAGNOSIS — I5031 Acute diastolic (congestive) heart failure: Secondary | ICD-10-CM

## 2013-09-25 DIAGNOSIS — I739 Peripheral vascular disease, unspecified: Secondary | ICD-10-CM

## 2013-09-25 LAB — GLUCOSE, CAPILLARY
Glucose-Capillary: 158 mg/dL — ABNORMAL HIGH (ref 70–99)
Glucose-Capillary: 78 mg/dL (ref 70–99)
Glucose-Capillary: 187 mg/dL — ABNORMAL HIGH (ref 70–99)

## 2013-09-25 LAB — CBC
HCT: 34.8 % — ABNORMAL LOW (ref 39.0–52.0)
MCHC: 31.9 g/dL (ref 30.0–36.0)
MCV: 81.1 fL (ref 78.0–100.0)
RBC: 4.29 MIL/uL (ref 4.22–5.81)
RDW: 14.9 % (ref 11.5–15.5)
WBC: 9.3 10*3/uL (ref 4.0–10.5)

## 2013-09-25 LAB — BASIC METABOLIC PANEL
BUN: 28 mg/dL — ABNORMAL HIGH (ref 6–23)
CO2: 31 mEq/L (ref 19–32)
Chloride: 98 mEq/L (ref 96–112)
Creatinine, Ser: 1.57 mg/dL — ABNORMAL HIGH (ref 0.50–1.35)
GFR calc Af Amer: 49 mL/min — ABNORMAL LOW (ref >90)

## 2013-09-25 LAB — HEPARIN LEVEL (UNFRACTIONATED): Heparin Unfractionated: 0.28 IU/mL — ABNORMAL LOW (ref 0.30–0.70)

## 2013-09-25 MED ORDER — FUROSEMIDE 10 MG/ML IJ SOLN
40.0000 mg | Freq: Once | INTRAMUSCULAR | Status: AC
  Administered 2013-09-25: 40 mg via INTRAVENOUS

## 2013-09-25 MED ORDER — FUROSEMIDE 10 MG/ML IJ SOLN
40.0000 mg | Freq: Two times a day (BID) | INTRAMUSCULAR | Status: DC
  Administered 2013-09-25: 15:00:00 40 mg via INTRAVENOUS
  Filled 2013-09-25 (×3): qty 4

## 2013-09-25 NOTE — Progress Notes (Signed)
       Patient Name: [name] Date of Encounter: 09/25/2013    SUBJECTIVE: He feels much better this morning. There is increased urination overnight. Significant coughing episode  TELEMETRY:  Normal sinus rhythm to: Filed Vitals:   09/24/13 1801 09/24/13 2104 09/25/13 0138 09/25/13 0544  BP: 158/55 145/52 143/63 149/62  Pulse: 100 87 62 73  Temp: 98.7 F (37.1 C) 98.2 F (36.8 C) 98.2 F (36.8 C) 98.1 F (36.7 C)  TempSrc: Oral Oral Oral Oral  Resp: 18 20 18 16   Height:      Weight:    164 lb 1.6 oz (74.435 kg)  SpO2: 90% 93% 93% 94%    Intake/Output Summary (Last 24 hours) at 09/25/13 0721 Last data filed at 09/24/13 2300  Gross per 24 hour  Intake    620 ml  Output   2825 ml  Net  -2205 ml    LABS: Basic Metabolic Panel:  Recent Labs  81/19/14 0355 09/24/13 0425  NA 136 140  K 4.3 4.4  CL 99 100  CO2 27  --   GLUCOSE 203* 217*  BUN 22 22  CREATININE 1.25 1.50*  CALCIUM 8.9  --    CBC:  Recent Labs  09/24/13 0355  09/24/13 0730 09/25/13 0525  WBC 15.5*  --  14.9* 9.3  NEUTROABS 13.1*  --  11.6*  --   HGB 10.6*  < > 10.7* 11.1*  HCT 31.8*  < > 32.2* 34.8*  MCV 79.3  --  79.7 81.1  PLT 309  --  306 334  < > = values in this interval not displayed. Hemoglobin A1C:  Recent Labs  09/24/13 0730  HGBA1C 7.4*    Radiology/Studies:  No new data  Physical Exam: Blood pressure 149/62, pulse 73, temperature 98.1 F (36.7 C), temperature source Oral, resp. rate 16, height 5\' 6"  (1.676 m), weight 164 lb 1.6 oz (74.435 kg), SpO2 94.00%. Weight change: -4 lb 8.8 oz (-2.065 kg)   Neck veins are relatively flat  Scattered rhonchi are noted. No rales.  S4 gallop on auscultation.  No edema.  ASSESSMENT:  1, Acute on chronic diastolic HF, improving with diuresis 2. CKD type 2 3. ACS likely due to HF 4. Still elevated WBC  Plan:  1. Continue diuresis 2. Follow renal function daily 3. If CP resolves with diuresis, no ischemic  workup. 4. Will recheck PA and lateral chest x-ray  Signed, Lesleigh Noe 09/25/2013, 7:21 AM

## 2013-09-25 NOTE — Progress Notes (Signed)
ANTICOAGULATION CONSULT NOTE: HEPARIN  Indication: NSTEMI  Heparin level = 0.52 on 1550 units/hr Goal heparin level = 0.3-0.7  The heparin level is within the desired therapeutic range. No change needed. Will continue to monitor heparin levels with AM labs.  Cardell Peach, PharmD

## 2013-09-25 NOTE — Progress Notes (Signed)
CSW received request to assist patient and his wife with completion of Health Care Power of [address] and [place].  They had reviewed and completed the document and only required notarization and witnesses;  Documents were completed by Wilmon Arms, Pastoral Care and notarized.  CSW assisted with making 4 copies for both patient and his wife; a copy was placed on front of patient's chart.  They were both very appreciative of assistance with completion of documents. Lorri Frederick. [place]  [phone]  `

## 2013-09-25 NOTE — Progress Notes (Signed)
ANTICOAGULATION CONSULT NOTE - Follow Up Consult  Pharmacy Consult for Heparin Indication: NSTEMI  No Known Allergies  Patient Measurements: Height: 5\' 6"  (167.6 cm) Weight: 164 lb 1.6 oz (74.435 kg) (b scale) IBW/kg (Calculated) : 63.8 Heparin Dosing Weight: 74.4kg  Vital Signs: Temp: 98.1 F (36.7 C) (12/09 0544) Temp src: Oral (12/09 0544) BP: 149/62 mmHg (12/09 0544) Pulse Rate: 73 (12/09 0544)  Labs:  Recent Labs  09/24/13 0355 09/24/13 0425 09/24/13 0730 09/24/13 1230 09/24/13 2120 09/25/13 0525  HGB 10.6* 11.2* 10.7*  --   --  11.1*  HCT 31.8* 33.0* 32.2*  --   --  34.8*  PLT 309  --  306  --   --  334  HEPARINUNFRC  --   --   --  0.19* 0.24* 0.28*  CREATININE 1.25 1.50*  --   --   --   --     Estimated Creatinine Clearance: 40.2 ml/min (by C-G formula based on Cr of 1.5).   Medications:  Heparin 1400 units/hr  Assessment: 72yom continuing on heparin for NSTEMI. Heparin level (0.28) remains subtherapeutic despite rate increases. No problems with line/infusion per RN - will increase rate and check follow-up heparin level. - H/H and Plts improving - No significant bleeding reported  Goal of Therapy:  Heparin level 0.3-0.7 units/ml Monitor platelets by anticoagulation protocol: Yes   Plan:  1. Increase heparin drip to 1550 units/hr (15.5 ml/hr) 2. Check heparin level 8 hours after rate increase 3. Daily heparin level and CBC 4. Follow-up length of therapy and Cards plan  Cleon Dew [phone] 09/25/2013,8:51 AM

## 2013-09-25 NOTE — Progress Notes (Addendum)
Pt O4x no complaints for Cp or SOB. Md order to call in Am labs, will ask MD order for foley, Pt self caths. CHF booklet given and video shown to Pt.  Will continue to monitor

## 2013-09-25 NOTE — Progress Notes (Signed)
Call MD will BMET level, MD order PT to get 40IV lasix bid, Md order 2 dose for today. Will continue to monitor

## 2013-09-26 ENCOUNTER — Inpatient Hospital Stay (HOSPITAL_COMMUNITY): Payer: Medicare Other

## 2013-09-26 LAB — GLUCOSE, CAPILLARY
Glucose-Capillary: 151 mg/dL — ABNORMAL HIGH (ref 70–99)
Glucose-Capillary: 129 mg/dL — ABNORMAL HIGH (ref 70–99)
Glucose-Capillary: 142 mg/dL — ABNORMAL HIGH (ref 70–99)

## 2013-09-26 LAB — CBC
Hemoglobin: 11.5 g/dL — ABNORMAL LOW (ref 13.0–17.0)
MCHC: 33 g/dL (ref 30.0–36.0)
MCV: 79.7 fL (ref 78.0–100.0)
Platelets: 373 10*3/uL (ref 150–400)
RBC: 4.38 MIL/uL (ref 4.22–5.81)
RDW: 14.6 % (ref 11.5–15.5)

## 2013-09-26 LAB — BASIC METABOLIC PANEL
BUN: 30 mg/dL — ABNORMAL HIGH (ref 6–23)
CO2: 32 mEq/L (ref 19–32)
GFR calc non Af Amer: 43 mL/min — ABNORMAL LOW (ref >90)
Glucose, Bld: 145 mg/dL — ABNORMAL HIGH (ref 70–99)
Potassium: 3.9 mEq/L (ref 3.5–5.1)
Sodium: 140 mEq/L (ref 135–145)

## 2013-09-26 LAB — HEPARIN LEVEL (UNFRACTIONATED): Heparin Unfractionated: 0.2 IU/mL — ABNORMAL LOW (ref 0.30–0.70)

## 2013-09-26 MED ORDER — ENOXAPARIN SODIUM 40 MG/0.4ML ~~LOC~~ SOLN
40.0000 mg | SUBCUTANEOUS | Status: DC
  Administered 2013-09-26: 40 mg via SUBCUTANEOUS
  Filled 2013-09-26 (×2): qty 0.4

## 2013-09-26 MED ORDER — SODIUM CHLORIDE 0.9 % IJ SOLN
3.0000 mL | Freq: Two times a day (BID) | INTRAMUSCULAR | Status: DC
  Administered 2013-09-26: 10:00:00 3 mL via INTRAVENOUS

## 2013-09-26 MED ORDER — FUROSEMIDE 40 MG PO TABS
40.0000 mg | ORAL_TABLET | Freq: Two times a day (BID) | ORAL | Status: DC
  Administered 2013-09-26 – 2013-09-27 (×3): 40 mg via ORAL
  Filled 2013-09-26 (×5): qty 1

## 2013-09-26 NOTE — Plan of Care (Signed)
Problem: Phase I Progression Outcomes Goal: EF % per last Echo/documented,Core Reminder form on chart 50-55%     

## 2013-09-26 NOTE — Progress Notes (Signed)
Nt remove foley

## 2013-09-26 NOTE — Progress Notes (Signed)
ANTICOAGULATION CONSULT NOTE - Follow Up Consult  Pharmacy Consult for heparin Indication: NSTEMI  Labs:  Recent Labs  09/24/13 0355 09/24/13 0425 09/24/13 0730  09/25/13 0525 09/25/13 1045 09/25/13 1635 09/26/13 0433  HGB 10.6* 11.2* 10.7*  --  11.1*  --   --  11.5*  HCT 31.8* 33.0* 32.2*  --  34.8*  --   --  34.9*  PLT 309  --  306  --  334  --   --  373  HEPARINUNFRC  --   --   --   < > 0.28*  --  0.52 0.20*  CREATININE 1.25 1.50*  --   --   --  1.57*  --   --   < > = values in this interval not displayed.   Assessment: 72yo male now subtherapeutic on heparin after one level at goal with no gtt issues overnight.  Goal of Therapy:  Heparin level 0.3-0.7 units/ml   Plan:  Will increase heparin gtt by 3 units/kg/hr to 1800 units/hr and check level in 8hr.  Vernard Gambles, PharmD, BCPS  09/26/2013,6:02 AM

## 2013-09-26 NOTE — Progress Notes (Signed)
Pt o4x, no complaints of pain or sob. Will continue to monitor

## 2013-09-26 NOTE — Progress Notes (Signed)
ANTICOAGULATION CONSULT NOTE - Follow Up Consult  Pharmacy Consult for Heparin --> Lovenox Indication: VTE prophylaxis  No Known Allergies  Patient Measurements: Height: 5\' 6"  (167.6 cm) Weight: 157 lb 8 oz (71.442 kg) (Scale B) IBW/kg (Calculated) : 63.8 Heparin Dosing Weight:   Vital Signs: Temp: 98.1 F (36.7 C) (12/10 0524) Temp src: Oral (12/10 0524) BP: 139/52 mmHg (12/10 1011) Pulse Rate: 66 (12/10 1011)  Labs:  Recent Labs  09/24/13 0425 09/24/13 0730  09/25/13 0525 09/25/13 1045 09/25/13 1635 09/26/13 0433  HGB 11.2* 10.7*  --  11.1*  --   --  11.5*  HCT 33.0* 32.2*  --  34.8*  --   --  34.9*  PLT  --  306  --  334  --   --  373  HEPARINUNFRC  --   --   < > 0.28*  --  0.52 0.20*  CREATININE 1.50*  --   --   --  1.57*  --  1.54*  < > = values in this interval not displayed.  Estimated Creatinine Clearance: 39.1 ml/min (by C-G formula based on Cr of 1.54).  Assessment: 72yom has been on heparin drip for CP/NSTEMI. Cardiology has consulted pharmacy to transition patient to Lovenox for VTE prophylaxis. Heparin drip was turned off ~0900. - H/H and Plts wnl - No significant bleeding reported - CrCl 39 ml/min  Goal of Therapy:  Anti-Xa level 4 hours after dose:  0.3-0.6 units/ml Monitor platelets by anticoagulation protocol: Yes   Plan:  1. Lovenox 40mg  SQ q24h 2. Pharmacy will sign off. Please reconsult if additional assistance is needed.   Cleon Dew [phone] 09/26/2013,10:19 AM

## 2013-09-26 NOTE — Progress Notes (Signed)
       Patient Name: [name] Date of Encounter: 09/26/2013    SUBJECTIVE: Breathing is improved. No chest discomfort.  TELEMETRY:  Normal sinus rhythm: Filed Vitals:   09/25/13 1738 09/25/13 2121 09/26/13 0438 09/26/13 0524  BP: 134/52 161/54  154/65  Pulse: 75 76  72  Temp:  97.7 F (36.5 C)  98.1 F (36.7 C)  TempSrc:  Oral  Oral  Resp: 18 18  18   Height:      Weight:   157 lb 8 oz (71.442 kg)   SpO2: 96% 96%  95%    Intake/Output Summary (Last 24 hours) at 09/26/13 0846 Last data filed at 09/26/13 0340  Gross per 24 hour  Intake 857.57 ml  Output   1650 ml  Net -792.43 ml    LABS: Basic Metabolic Panel:  Recent Labs  65/78/46 1045 09/26/13 0433  NA 138 140  K 4.3 3.9  CL 98 98  CO2 31 32  GLUCOSE 195* 145*  BUN 28* 30*  CREATININE 1.57* 1.54*  CALCIUM 8.9 9.1   CBC:  Recent Labs  09/24/13 0355  09/24/13 0730 09/25/13 0525 09/26/13 0433  WBC 15.5*  --  14.9* 9.3 10.5  NEUTROABS 13.1*  --  11.6*  --   --   HGB 10.6*  < > 10.7* 11.1* 11.5*  HCT 31.8*  < > 32.2* 34.8* 34.9*  MCV 79.3  --  79.7 81.1 79.7  PLT 309  --  306 334 373  < > = values in this interval not displayed. Hemoglobin A1C:  Recent Labs  09/24/13 0730  HGBA1C 7.4*     Radiology/Studies:  CXR IMPRESSION:  Small bilateral pleural effusions seen best on the lateral view.  Slight prominence of the pulmonary vascularity as indicated by  prominence of the azygos vein. This has diminished since the prior  exam.  Electronically Signed  By: Geanie Cooley M.D.  On: 09/26/2013 07:57   Physical Exam: Blood pressure 154/65, pulse 72, temperature 98.1 F (36.7 C), temperature source Oral, resp. rate 18, height 5\' 6"  (1.676 m), weight 157 lb 8 oz (71.442 kg), SpO2 95.00%. Weight change: -8 lb 15.2 oz (-4.059 kg)   Chest is clear except thank basal rales Cardiac exam reveals an S4 gallop Extremities reveal no edema  ASSESSMENT:  1. Acute on chronic diastolic heart  failure, improving with diuresis 2. Chronic kidney disease, stage III 3. Coronary atherosclerosis with elevated troponin likely related to acute diastolic heart failure  Plan:  1. Discontinue Foley 2. Switch to oral diuretic therapy 3. Ambulate 4. Possible discharge in a.m.  Selinda Eon 09/26/2013, 8:46 AM

## 2013-09-26 NOTE — Progress Notes (Addendum)
Pt forgot to save urine. Put hat in room. Pt ambulated in hall w/wife

## 2013-09-26 NOTE — Progress Notes (Signed)
Pt ambulated hall on RA 94%. Tolerated well. Will continue to monitor

## 2013-09-27 ENCOUNTER — Other Ambulatory Visit: Payer: Self-pay | Admitting: Interventional Cardiology

## 2013-09-27 ENCOUNTER — Encounter: Payer: Self-pay | Admitting: Cardiology

## 2013-09-27 DIAGNOSIS — I5032 Chronic diastolic (congestive) heart failure: Secondary | ICD-10-CM

## 2013-09-27 DIAGNOSIS — I5033 Acute on chronic diastolic (congestive) heart failure: Principal | ICD-10-CM

## 2013-09-27 LAB — BASIC METABOLIC PANEL
BUN: 30 mg/dL — ABNORMAL HIGH (ref 6–23)
CO2: 30 mEq/L (ref 19–32)
Calcium: 9.2 mg/dL (ref 8.4–10.5)
Chloride: 98 mEq/L (ref 96–112)
Creatinine, Ser: 1.58 mg/dL — ABNORMAL HIGH (ref 0.50–1.35)
Sodium: 138 mEq/L (ref 135–145)

## 2013-09-27 LAB — GLUCOSE, CAPILLARY: Glucose-Capillary: 138 mg/dL — ABNORMAL HIGH (ref 70–99)

## 2013-09-27 MED ORDER — IRBESARTAN 300 MG PO TABS: 300.0000 mg | ORAL_TABLET | Freq: Every day | ORAL | Status: DC

## 2013-09-27 MED ORDER — POTASSIUM CHLORIDE ER 10 MEQ PO TBCR: 10.0000 meq | EXTENDED_RELEASE_TABLET | Freq: Every day | ORAL | Status: DC

## 2013-09-27 MED ORDER — FUROSEMIDE 40 MG PO TABS: 40.0000 mg | ORAL_TABLET | Freq: Two times a day (BID) | ORAL | Status: DC

## 2013-09-27 NOTE — Progress Notes (Signed)
Went over all discharge instructions with pt and given prescriptions. Pt aware of follow up appts.

## 2013-09-27 NOTE — Discharge Summary (Signed)
Patient ID: [name] MRN: [id number] DOB/AGE: [birth year] 72 y.o.  Admit date: 09/24/2013 Discharge date: 09/27/2013  Primary Discharge Diagnosis: Acute on chronic diastolic heart failure Secondary Discharge Diagnosis: Coronary artery disease with mild elevation in troponins likely secondary to demand ischemia Non-ST elevation MI COPD Diabetes mellitus PVD Hypertension Patient Active Problem List   Diagnosis Date Noted  . Acute on chronic diastolic heart failure 09/27/2013  . Non-STEMI (non-ST elevated myocardial infarction) 09/24/2013  . Hx of CABG 09/24/2013  . CAD (coronary artery disease), native coronary artery 09/24/2013  . COPD (chronic obstructive pulmonary disease) 09/24/2013  . PVD (peripheral vascular disease) 09/24/2013  . Diabetes mellitus with complication 09/24/2013  . 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 Esophageal stricture 09/24/2013     Significant Diagnostic Studies: 2-D Doppler echocardiogram  Consults: None  Hospital Course: 72 year old gentleman developing progressive cough and dyspnea over a 3-4 weeks. The weeks leading to admission he began developing orthopnea. He also noted some mild chest tightness. Upon admission he was noted to have significant elevation in BNP, congestion on chest x-ray, and mildly elevated troponins. The troponin levels never revealed a peak and trough. IV Lasix led to dramatic improvement in symptoms. The cough eventually dissipated. Orthopnea resolved. There were no evolutionary EKG changes.  The patient's ultimate diagnosis was that of acute on chronic diastolic heart failure. The concurrent echo performed on this hospitalization revealed an EF greater than 50%. No significant valvular abnormalities were noted.  On the day of discharge patient is improved. He ablated multiple times in the [place] without symptoms. In addition to his medical regimen is a loop diuretic, furosemide 40 mg twice daily.  The patient will followup  in clinic in a proximally 7 days with a beam at to evaluate for adequacy of diuresis.   Discharge Exam: Blood pressure 143/63, pulse 72, temperature 98.2 F (36.8 C), temperature source Oral, resp. rate 18, height 5\' 6"  (1.676 m), weight 156 lb 9.6 oz (71.033 kg), SpO2 94.00%.    Neck veins are flat. Chest are clear S4 gallop is heard on auscultation Extremities reveal no edema The neuro exam is unremarkable  Labs:   Lab Results  Component Value Date   WBC 10.5 09/26/2013   HGB 11.5* 09/26/2013   HCT 34.9* 09/26/2013   MCV 79.7 09/26/2013   PLT 373 09/26/2013    Recent Labs Lab 09/24/13 0355  09/27/13 0359  NA 136  < > 138  K 4.3  < > 4.1  CL 99  < > 98  CO2 27  < > 30  BUN 22  < > 30*  CREATININE 1.25  < > 1.58*  CALCIUM 8.9  < > 9.2  PROT 6.3  --   --   BILITOT 0.3  --   --   ALKPHOS 72  --   --   ALT 24  --   --   AST 19  --   --   GLUCOSE 203*  < > 136*  < > = values in this interval not displayed.     Radiology: 09/26/13 IMPRESSION:  Small bilateral pleural effusions seen best on the lateral view.  Slight prominence of the pulmonary vascularity as indicated by  prominence of the azygos vein. This has diminished since the prior  exam.   EKG: No acute changes noted  FOLLOW UP PLANS AND APPOINTMENTS      Future Appointments Provider Department Dept Phone   10/05/2013 11:15 AM Lyn Records III,  MD [facility] [address] [phone]       Medication List    STOP taking these medications       valsartan-hydrochlorothiazide 320-12.5 MG per tablet  Commonly known as:  DIOVAN-HCT      TAKE these medications       acetaminophen 325 MG tablet  Commonly known as:  TYLENOL  Take 650 mg by mouth every 6 (six) hours as needed for moderate pain.     amLODipine 10 MG tablet  Commonly known as:  NORVASC  Take 10 mg by mouth daily.     aspirin EC 325 MG tablet  Take 325 mg by mouth daily.     atorvastatin 20 MG tablet  Commonly known as:   LIPITOR  Take 20 mg by mouth daily.     cholecalciferol 1000 UNITS tablet  Commonly known as:  VITAMIN D  Take 1,000 Units by mouth daily.     clopidogrel 75 MG tablet  Commonly known as:  PLAVIX  Take 75 mg by mouth daily with breakfast.     furosemide 40 MG tablet  Commonly known as:  LASIX  Take 1 tablet (40 mg total) by mouth 2 (two) times daily.     glipiZIDE 5 MG 24 hr tablet  Commonly known as:  GLUCOTROL XL  Take 5 mg by mouth daily with breakfast.     irbesartan 300 MG tablet  Commonly known as:  AVAPRO  Take 1 tablet (300 mg total) by mouth daily.     metFORMIN 1000 MG tablet  Commonly known as:  GLUCOPHAGE  Take 1,000 mg by mouth 2 (two) times daily with a meal.     metoprolol succinate 50 MG 24 hr tablet  Commonly known as:  TOPROL-XL  Take 50 mg by mouth daily. Take with or immediately following a meal.     nitroGLYCERIN 0.4 MG SL tablet  Commonly known as:  NITROSTAT  Place 0.4 mg under the tongue every 5 (five) minutes as needed for chest pain.     omeprazole 20 MG capsule  Commonly known as:  PRILOSEC  Take 20 mg by mouth daily.     potassium chloride 10 MEQ tablet  Commonly known as:  KLOR-CON 10  Take 1 tablet (10 mEq total) by mouth daily.       Follow-up Information   Follow up with Lesleigh Noe, MD On 10/05/2013. (11:15P)    Specialty:  Cardiology   Contact information:   [address] [place] [address] [phone]       BRING ALL MEDICATIONS WITH YOU TO FOLLOW UP APPOINTMENTS  Time spent with patient to include physician time: 30 Signed: Lesleigh Noe 09/27/2013, 12:44 PM

## 2013-09-27 NOTE — Progress Notes (Signed)
Pt discharged per w/c with all personal belongings d/c info and scripts. Has heart failure teaching material and says he will weigh daily and record.

## 2013-10-03 DIAGNOSIS — R072 Precordial pain: Secondary | ICD-10-CM | POA: Diagnosis not present

## 2013-10-03 DIAGNOSIS — I214 Non-ST elevation (NSTEMI) myocardial infarction: Secondary | ICD-10-CM | POA: Diagnosis not present

## 2013-10-05 ENCOUNTER — Encounter: Payer: Self-pay | Admitting: Interventional Cardiology

## 2013-10-05 ENCOUNTER — Ambulatory Visit (INDEPENDENT_AMBULATORY_CARE_PROVIDER_SITE_OTHER): Payer: Medicare Other | Admitting: Interventional Cardiology

## 2013-10-05 VITALS — BP 146/50 | HR 63 | Ht 66.0 in | Wt 163.0 lb

## 2013-10-05 DIAGNOSIS — I2510 Atherosclerotic heart disease of native coronary artery without angina pectoris: Secondary | ICD-10-CM

## 2013-10-05 DIAGNOSIS — I5032 Chronic diastolic (congestive) heart failure: Secondary | ICD-10-CM

## 2013-10-05 DIAGNOSIS — E785 Hyperlipidemia, unspecified: Secondary | ICD-10-CM | POA: Diagnosis not present

## 2013-10-05 DIAGNOSIS — I10 Essential (primary) hypertension: Secondary | ICD-10-CM

## 2013-10-05 LAB — BASIC METABOLIC PANEL
BUN: 42 mg/dL — ABNORMAL HIGH (ref 6–23)
Calcium: 9.3 mg/dL (ref 8.4–10.5)
Chloride: 100 mEq/L (ref 96–112)
GFR: 38.62 mL/min — ABNORMAL LOW (ref >60.00)
Glucose, Bld: 65 mg/dL — ABNORMAL LOW (ref 70–99)
Potassium: 4.8 mEq/L (ref 3.5–5.1)

## 2013-10-05 NOTE — Patient Instructions (Signed)
Your physician recommends that you continue on your current medications as directed. Please refer to the Current Medication list given to you today.  Your physician recommends that you have labs today: BMET  Your physician recommends that you schedule a follow-up appointment in: 2 months with Dr. Katrinka Blazing

## 2013-10-05 NOTE — Addendum Note (Signed)
Addended by: Williemae Area on: 10/05/2013 01:46 PM   Modules accepted: Orders

## 2013-10-05 NOTE — Progress Notes (Signed)
Patient ID: [name], male   DOB: [birth year], 72 y.o.   MRN: [id number]    [address] [place], [place]  [address] Phone: [phone] Fax:  [phone]  Date:  10/05/2013   ID:  [name], DOB [birth year], MRN [id number]  PCP:  Johny Blamer, MD    ASSESSMENT:  1. Chronic systolic and diastolic heart failure, stable after adjustment in diuretic regimen during the most recent hospital stay. 2. Coronary artery disease with progressive angina now controlled after adequate diuresis and reduction in LV volume 3. Hypertension under good control  PLAN:  1. Basic metabolic panel today to ensure that we are not overly aggressive with diuretic therapy 2. Cautioned to call if shortness of breath or chest discomfort 3. Clinical followup in 2 months   SUBJECTIVE: [name] is a 72 y.o. male who was admitted recently with chest tightness and dyspnea. There been a 3 to four-week history of orthopnea, slight lower extremity swelling, and significant exertional dyspnea and chest tightness. After he was amended to the hospital my clinical assessment was that of acute on chronic diastolic heart failure. Aggressive diuresis led to dramatic improvement with a 7-8 pound weight loss from diuresis. Since discharge he has been weighing daily and his weight is been stable at 154 pounds on his home scale. He denies orthostatic dizziness, syncope, palpitations, and angina.   Wt Readings from Last 3 Encounters:  10/05/13 163 lb (73.936 kg)  09/27/13 156 lb 9.6 oz (71.033 kg)  09/12/13 168 lb 12.8 oz (76.567 kg)     Past Medical History  Diagnosis Date  . Carotid artery occlusion     Bilateral 50-69% stenoses of the internal carotid arteries. He needs carotid Dopplers every 6 months.  . Coronary artery disease     CABG in 1999. Stent in SVG to RCA in 2012 complicated by no reflow  . Diabetes mellitus without complication   . Hyperlipidemia   . Atherosclerosis of  native arteries of the extremities with intermittent claudication Left external iliac artery stent in 2001    Bilateral SFA occlusions documented in 2001.  Marland Kitchen OSA (obstructive sleep apnea)   . Hypertension   . GERD (gastroesophageal reflux disease)   . MI (myocardial infarction)   . Coronary artery disease   . Non-STEMI (non-ST elevated myocardial infarction) 09/24/2013  . 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 1999   . CAD (coronary artery disease), native coronary artery 09/24/2013    Native circumflex Taxus stent 2007, angioplasty of the SVG to native right coronary 2006, DES stent to the SVG of RCA 2007 and resplinted 2009    . COPD (chronic obstructive pulmonary disease) 09/24/2013  . PVD (peripheral vascular disease) 09/24/2013  . Diabetes mellitus with complication 09/24/2013  . PAD (peripheral artery disease)   . History of urinary retention     has to perform self catheterizations  . 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 Esophageal stricture 09/24/2013    History of dilatation. Long-standing history of esophageal reflux     Current Outpatient Prescriptions  Medication Sig Dispense Refill  . acetaminophen (TYLENOL) 325 MG tablet Take 650 mg by mouth every 6 (six) hours as needed for moderate pain.      Marland Kitchen amLODipine (NORVASC) 10 MG  tablet Take 10 mg by mouth daily.      Marland Kitchen aspirin EC 325 MG tablet Take 325 mg by mouth daily.      Marland Kitchen atorvastatin (LIPITOR) 20 MG tablet Take 20 mg by mouth daily.      . cholecalciferol (VITAMIN D) 1000 UNITS tablet Take 1,000 Units by mouth daily.      . clopidogrel (PLAVIX) 75 MG tablet Take 75 mg by mouth daily with breakfast.      . furosemide (LASIX) 40 MG tablet Take 1 tablet (40 mg total) by mouth 2 (two) times daily.  60 tablet  11  . glipiZIDE (GLUCOTROL XL) 5 MG 24 hr tablet  Take 5 mg by mouth daily with breakfast.      . irbesartan (AVAPRO) 300 MG tablet Take 1 tablet (300 mg total) by mouth daily.  30 tablet  11  . metFORMIN (GLUCOPHAGE) 1000 MG tablet Take 1,000 mg by mouth 2 (two) times daily with a meal.      . metoprolol succinate (TOPROL-XL) 50 MG 24 hr tablet Take 1 tablet (50 mg total) by mouth daily.  90 tablet  3  . nitroGLYCERIN (NITROSTAT) 0.4 MG SL tablet Place 0.4 mg under the tongue every 5 (five) minutes as needed for chest pain.      Marland Kitchen omeprazole (PRILOSEC) 20 MG capsule Take 20 mg by mouth daily.      . potassium chloride (KLOR-CON 10) 10 MEQ tablet Take 1 tablet (10 mEq total) by mouth daily.  30 tablet  11   No current facility-administered medications for this visit.    Allergies:   No Known Allergies  Social History:  The patient  reports that he quit smoking about 3 months ago. He has never used smokeless tobacco. He reports that he does not drink alcohol or use illicit drugs.   ROS:  Please see the history of present illness.   Denies claudication. No transient neurological complaints. Orthopnea has resolved.   All other systems reviewed and negative.   OBJECTIVE: VS:  BP 146/50  Pulse 63  Ht 5\' 6"  (1.676 m)  Wt 163 lb (73.936 kg)  BMI 26.32 kg/m2 Well nourished, well developed, in no acute distress, healthy HEENT: normal Neck: JVD flat. Carotid bruit faint bilateral  Cardiac:  normal S1, S2; RRR; no murmur. No gallop Lungs:  clear to auscultation bilaterally, no wheezing, rhonchi or rales Abd: soft, nontender, no hepatomegaly Ext: Edema absent. Pulses diminished bilaterally Skin: warm and dry Neuro:  CNs 2-12 intact, no focal abnormalities noted  EKG:  Right bundle branch block, inferior infarction, left axis deviation to       Signed, Darci Needle III, MD 10/05/2013 12:20 PM

## 2013-10-08 ENCOUNTER — Telehealth: Payer: Self-pay

## 2013-10-08 MED ORDER — FUROSEMIDE 40 MG PO TABS: 40.0000 mg | ORAL_TABLET | Freq: Every day | ORAL | Status: DC

## 2013-10-08 NOTE — Telephone Encounter (Signed)
pt given lab results.Decrease furosemide to 40 mg daily.pt verbalized  understanding.

## 2013-10-08 NOTE — Telephone Encounter (Signed)
Message copied by Jarvis Newcomer on Mon Oct 08, 2013 11:14 AM ------      Message from: Verdis Prime      Created: Mon Oct 08, 2013  8:11 AM       Decrease furosemide to 40 mg daily ------

## 2013-10-13 ENCOUNTER — Other Ambulatory Visit: Payer: Self-pay | Admitting: Interventional Cardiology

## 2013-10-24 DIAGNOSIS — M999 Biomechanical lesion, unspecified: Secondary | ICD-10-CM | POA: Diagnosis not present

## 2013-10-24 DIAGNOSIS — M5000 Cervical disc disorder with myelopathy, unspecified cervical region: Secondary | ICD-10-CM | POA: Diagnosis not present

## 2013-10-24 DIAGNOSIS — M9981 Other biomechanical lesions of cervical region: Secondary | ICD-10-CM | POA: Diagnosis not present

## 2013-10-24 DIAGNOSIS — M62838 Other muscle spasm: Secondary | ICD-10-CM | POA: Diagnosis not present

## 2013-10-24 DIAGNOSIS — M5126 Other intervertebral disc displacement, lumbar region: Secondary | ICD-10-CM | POA: Diagnosis not present

## 2013-10-26 DIAGNOSIS — M5126 Other intervertebral disc displacement, lumbar region: Secondary | ICD-10-CM | POA: Diagnosis not present

## 2013-10-26 DIAGNOSIS — M5000 Cervical disc disorder with myelopathy, unspecified cervical region: Secondary | ICD-10-CM | POA: Diagnosis not present

## 2013-10-26 DIAGNOSIS — M9981 Other biomechanical lesions of cervical region: Secondary | ICD-10-CM | POA: Diagnosis not present

## 2013-10-26 DIAGNOSIS — M999 Biomechanical lesion, unspecified: Secondary | ICD-10-CM | POA: Diagnosis not present

## 2013-10-26 DIAGNOSIS — M62838 Other muscle spasm: Secondary | ICD-10-CM | POA: Diagnosis not present

## 2013-12-06 ENCOUNTER — Ambulatory Visit: Payer: Self-pay | Admitting: Interventional Cardiology

## 2013-12-13 ENCOUNTER — Telehealth: Payer: Self-pay | Admitting: Physician Assistant

## 2013-12-13 NOTE — Telephone Encounter (Signed)
[name] is a 73 y.o. male with diastolic CHF.  He called in with weight gain of 5 lbs since yesterday.  He has a [employer] scale.  Denies chest pain, dyspnea, edema, cough. He is on Lasix 40 QD. I asked him to take and extra Lasix 40 mg today. He will call back tomorrow if his weight continues to increase. Richardson Dopp, PA-C   12/13/2013 9:02 AM

## 2013-12-26 DIAGNOSIS — H18339 Rupture in Descemet's membrane, unspecified eye: Secondary | ICD-10-CM | POA: Diagnosis not present

## 2013-12-26 DIAGNOSIS — E119 Type 2 diabetes mellitus without complications: Secondary | ICD-10-CM | POA: Diagnosis not present

## 2013-12-26 DIAGNOSIS — H4320 Crystalline deposits in vitreous body, unspecified eye: Secondary | ICD-10-CM | POA: Diagnosis not present

## 2013-12-26 DIAGNOSIS — Z961 Presence of intraocular lens: Secondary | ICD-10-CM | POA: Diagnosis not present

## 2014-01-01 ENCOUNTER — Ambulatory Visit (HOSPITAL_COMMUNITY): Payer: Medicare Other | Attending: Cardiology | Admitting: Cardiology

## 2014-01-01 DIAGNOSIS — I6529 Occlusion and stenosis of unspecified carotid artery: Secondary | ICD-10-CM

## 2014-01-01 NOTE — Progress Notes (Signed)
Carotid complete 

## 2014-01-02 ENCOUNTER — Encounter (HOSPITAL_COMMUNITY): Payer: Self-pay | Admitting: Interventional Cardiology

## 2014-01-10 ENCOUNTER — Telehealth: Payer: Self-pay

## 2014-01-10 DIAGNOSIS — I6529 Occlusion and stenosis of unspecified carotid artery: Secondary | ICD-10-CM

## 2014-01-10 NOTE — Telephone Encounter (Signed)
Message copied by Lamar Laundry on Thu Jan 10, 2014 10:20 AM ------      Message from: Daneen Schick      Created: Sun Jan 06, 2014  8:11 AM       Bilateral plaque / obstruction less than 60% ------

## 2014-01-10 NOTE — Telephone Encounter (Signed)
pt given carotid duplex results.Bilateral plaque / obstruction less than 60% repeat in 1 year.pt verbalized understanding.

## 2014-01-14 DIAGNOSIS — N402 Nodular prostate without lower urinary tract symptoms: Secondary | ICD-10-CM | POA: Diagnosis not present

## 2014-01-14 DIAGNOSIS — N312 Flaccid neuropathic bladder, not elsewhere classified: Secondary | ICD-10-CM | POA: Diagnosis not present

## 2014-01-14 DIAGNOSIS — R339 Retention of urine, unspecified: Secondary | ICD-10-CM | POA: Diagnosis not present

## 2014-01-16 ENCOUNTER — Ambulatory Visit (INDEPENDENT_AMBULATORY_CARE_PROVIDER_SITE_OTHER): Payer: Medicare Other | Admitting: Interventional Cardiology

## 2014-01-16 ENCOUNTER — Encounter: Payer: Self-pay | Admitting: Interventional Cardiology

## 2014-01-16 VITALS — BP 140/50 | HR 84 | Ht 66.0 in | Wt 169.1 lb

## 2014-01-16 DIAGNOSIS — I6529 Occlusion and stenosis of unspecified carotid artery: Secondary | ICD-10-CM | POA: Diagnosis not present

## 2014-01-16 DIAGNOSIS — I509 Heart failure, unspecified: Secondary | ICD-10-CM

## 2014-01-16 DIAGNOSIS — J4489 Other specified chronic obstructive pulmonary disease: Secondary | ICD-10-CM

## 2014-01-16 DIAGNOSIS — I10 Essential (primary) hypertension: Secondary | ICD-10-CM | POA: Diagnosis not present

## 2014-01-16 DIAGNOSIS — I2510 Atherosclerotic heart disease of native coronary artery without angina pectoris: Secondary | ICD-10-CM

## 2014-01-16 DIAGNOSIS — I5042 Chronic combined systolic (congestive) and diastolic (congestive) heart failure: Secondary | ICD-10-CM

## 2014-01-16 DIAGNOSIS — J449 Chronic obstructive pulmonary disease, unspecified: Secondary | ICD-10-CM

## 2014-01-16 DIAGNOSIS — E785 Hyperlipidemia, unspecified: Secondary | ICD-10-CM

## 2014-01-16 NOTE — Progress Notes (Signed)
Patient ID: [name], male   DOB: 21-Aug-1941, 73 y.o.   MRN: [id number]    [address], [address] [place], [place]  [address] Phone: [phone] Fax:  [phone]  Date:  01/16/2014   ID:  [name], DOB 07/12/1941, MRN [id number]  PCP:  Shirline Frees, MD   ASSESSMENT:  1. Chronic combined systolic and diastolic heart failure, stable 2. Coronary artery disease, stable without recent angina 3. Hypertension under control 4. Peripheral vascular disease with stable claudication  PLAN:  1. We came up with a sliding scale for diuretic therapy. He will use an extra 40 mg of Lasix on any day when his weight is 3 pounds above his dry weight which is 163 pounds. He will keep a record of how often this occurs. We may have to chronically increase his furosemide dose to 60 mg per day 2. Clinical followup in 6 months   SUBJECTIVE: [name] is a 73 y.o. male doing well. Has chronic combined systolic and diastolic heart failure. He notes over the past 2 months since I have seen him, he has had to take an extra 40 mg of Lasix on several occasions because of greater than 3 pound weight gain. This always seems to bring his weight back to baseline. On one occasion when he gain greater than 5 pounds he has swelling and shortness of breath. He's had rare angina. No syncope. Stable exertional claudication. There been no transient neurological symptoms.   Wt Readings from Last 3 Encounters:  01/16/14 169 lb 1.9 oz (76.712 kg)  10/05/13 163 lb (73.936 kg)  09/27/13 156 lb 9.6 oz (71.033 kg)     Past Medical History  Diagnosis Date  . Carotid artery occlusion     Bilateral 50-69% stenoses of the internal carotid arteries. He needs carotid Dopplers every 6 months.  . Coronary artery disease     CABG in 1999. Stent in SVG to RCA in 4765 complicated by no reflow  . Diabetes mellitus without complication   . Hyperlipidemia   . Atherosclerosis of native arteries of the  extremities with intermittent claudication Left external iliac artery stent in 2001    Bilateral SFA occlusions documented in 2001.  Marland Kitchen OSA (obstructive sleep apnea)   . Hypertension   . GERD (gastroesophageal reflux disease)   . MI (myocardial infarction)   . Coronary artery disease   . Non-STEMI (non-ST elevated myocardial infarction) 09/24/2013  . 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 1999   . CAD (coronary artery disease), native coronary artery 09/24/2013    Native circumflex Taxus stent 2007, angioplasty of the SVG to native right coronary 2006, DES stent to the SVG of RCA 2007 and resplinted 2009    . COPD (chronic obstructive pulmonary disease) 09/24/2013  . PVD (peripheral vascular disease) 09/24/2013  . Diabetes mellitus with complication 46/02/353  . PAD (peripheral artery disease)   . History of urinary retention     has to perform self catheterizations  . 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 Esophageal stricture 09/24/2013    History of dilatation. Long-standing history of esophageal reflux     Current Outpatient Prescriptions  Medication Sig Dispense Refill  . acetaminophen (TYLENOL) 325 MG tablet Take 650 mg by mouth every 6 (six)  hours as needed for moderate pain.      Marland Kitchen amLODipine (NORVASC) 10 MG tablet Take 10 mg by mouth daily.      Marland Kitchen aspirin EC 325 MG tablet Take 325 mg by mouth daily.      Marland Kitchen atorvastatin (LIPITOR) 20 MG tablet Take 20 mg by mouth daily.      . cholecalciferol (VITAMIN D) 1000 UNITS tablet Take 1,000 Units by mouth daily.      . clopidogrel (PLAVIX) 75 MG tablet Take 75 mg by mouth daily with breakfast.      . furosemide (LASIX) 40 MG tablet Take 1 tablet (40 mg total) by mouth daily.      Marland Kitchen glipiZIDE (GLUCOTROL XL) 5 MG 24 hr tablet Take 5 mg by mouth daily with breakfast.      .  irbesartan (AVAPRO) 300 MG tablet Take 1 tablet (300 mg total) by mouth daily.  30 tablet  11  . metFORMIN (GLUCOPHAGE) 1000 MG tablet Take 1,000 mg by mouth 2 (two) times daily with a meal.      . metoprolol succinate (TOPROL-XL) 50 MG 24 hr tablet Take 1 tablet (50 mg total) by mouth daily.  90 tablet  3  . NITROSTAT 0.4 MG SL tablet TAKE 1 TABLET SUBLINGUALLY AS NEEDED AS DIRECTED  25 tablet  0  . omeprazole (PRILOSEC) 20 MG capsule Take 20 mg by mouth daily.      . potassium chloride (KLOR-CON 10) 10 MEQ tablet Take 1 tablet (10 mEq total) by mouth daily.  30 tablet  11   No current facility-administered medications for this visit.    Allergies:   No Known Allergies  Social History:  The patient  reports that he quit smoking about 6 months ago. He has never used smokeless tobacco. He reports that he does not drink alcohol or use illicit drugs.   ROS:  Please see the history of present illness.   Weight has been variable. He denies neurological complaints. There is no orthopnea or PND.   All other systems reviewed and negative.   OBJECTIVE: VS:  BP 140/50  Pulse 84  Ht 5\' 6"  (1.676 m)  Wt 169 lb 1.9 oz (76.712 kg)  BMI 27.31 kg/m2 Well nourished, well developed, in no acute distress, appearing older than stated age 99: normal Neck: JVD flat. Carotid bruit bilateral carotid bruits are heard, louder on the left  Cardiac:  normal S1, S2; RRR; no murmur Lungs:  clear to auscultation bilaterally, no wheezing, rhonchi or rales Abd: soft, nontender, no hepatomegaly Ext: Edema absent. Pulses trace to 1+ bilateral Skin: warm and dry Neuro:  CNs 2-12 intact, no focal abnormalities noted  EKG:  Not repeated       Signed, Illene Labrador III, MD 01/16/2014 12:34 PM

## 2014-01-16 NOTE — Patient Instructions (Addendum)
Your physician recommends that you continue on your current medications as directed. Please refer to the Current Medication list given to you today.  Weigh daily ok to take an additional Lasix if your are up in weight 3lbs or more   Your physician recommends that you return for lab work in: 4 months  Your physician wants you to follow-up in: 4 months You will receive a reminder letter in the mail two months in advance. If you don't receive a letter, please call our office to schedule the follow-up appointment.

## 2014-01-29 ENCOUNTER — Encounter (HOSPITAL_COMMUNITY): Payer: Medicare Other

## 2014-01-29 DIAGNOSIS — H0019 Chalazion unspecified eye, unspecified eyelid: Secondary | ICD-10-CM | POA: Diagnosis not present

## 2014-01-29 DIAGNOSIS — H18239 Secondary corneal edema, unspecified eye: Secondary | ICD-10-CM | POA: Diagnosis not present

## 2014-01-29 DIAGNOSIS — H18339 Rupture in Descemet's membrane, unspecified eye: Secondary | ICD-10-CM | POA: Diagnosis not present

## 2014-02-07 DIAGNOSIS — IMO0001 Reserved for inherently not codable concepts without codable children: Secondary | ICD-10-CM | POA: Diagnosis not present

## 2014-02-07 DIAGNOSIS — E785 Hyperlipidemia, unspecified: Secondary | ICD-10-CM | POA: Diagnosis not present

## 2014-02-07 DIAGNOSIS — I509 Heart failure, unspecified: Secondary | ICD-10-CM | POA: Diagnosis not present

## 2014-02-07 DIAGNOSIS — I10 Essential (primary) hypertension: Secondary | ICD-10-CM | POA: Diagnosis not present

## 2014-02-20 ENCOUNTER — Ambulatory Visit (INDEPENDENT_AMBULATORY_CARE_PROVIDER_SITE_OTHER): Payer: Medicare Other | Admitting: Podiatry

## 2014-02-20 ENCOUNTER — Encounter: Payer: Self-pay | Admitting: Podiatry

## 2014-02-20 VITALS — BP 175/71 | HR 67 | Ht 66.0 in | Wt 171.0 lb

## 2014-02-20 DIAGNOSIS — M722 Plantar fascial fibromatosis: Secondary | ICD-10-CM

## 2014-02-20 DIAGNOSIS — E118 Type 2 diabetes mellitus with unspecified complications: Secondary | ICD-10-CM

## 2014-02-20 DIAGNOSIS — M21612 Bunion of left foot: Principal | ICD-10-CM

## 2014-02-20 DIAGNOSIS — M21619 Bunion of unspecified foot: Secondary | ICD-10-CM

## 2014-02-20 DIAGNOSIS — M21611 Bunion of right foot: Secondary | ICD-10-CM

## 2014-02-20 DIAGNOSIS — E1149 Type 2 diabetes mellitus with other diabetic neurological complication: Secondary | ICD-10-CM

## 2014-02-20 NOTE — Progress Notes (Signed)
Patient presents with a certificate from PCP for diabetic shoes. Both feet measured for diabetic shoes.   Assessment: Bunion deformity bilateral. Plantar fasciitis. Diabetes.  Plan: Both feet measured for diabetic shoes.

## 2014-02-20 NOTE — Patient Instructions (Signed)
Measured for diabetic shoes.

## 2014-02-25 ENCOUNTER — Encounter (HOSPITAL_COMMUNITY): Payer: Self-pay | Admitting: Emergency Medicine

## 2014-02-25 ENCOUNTER — Emergency Department (HOSPITAL_COMMUNITY): Payer: Medicare Other

## 2014-02-25 ENCOUNTER — Inpatient Hospital Stay (HOSPITAL_COMMUNITY)
Admission: EM | Admit: 2014-02-25 | Discharge: 2014-03-07 | DRG: 233 | Disposition: A | Discharge status: Home Health | Payer: Medicare Other | Attending: Surgery | Admitting: Surgery

## 2014-02-25 DIAGNOSIS — T82897A Other specified complication of cardiac prosthetic devices, implants and grafts, initial encounter: Secondary | ICD-10-CM | POA: Diagnosis present

## 2014-02-25 DIAGNOSIS — I200 Unstable angina: Secondary | ICD-10-CM

## 2014-02-25 DIAGNOSIS — I5042 Chronic combined systolic (congestive) and diastolic (congestive) heart failure: Secondary | ICD-10-CM

## 2014-02-25 DIAGNOSIS — E875 Hyperkalemia: Secondary | ICD-10-CM | POA: Diagnosis present

## 2014-02-25 DIAGNOSIS — J449 Chronic obstructive pulmonary disease, unspecified: Secondary | ICD-10-CM | POA: Diagnosis present

## 2014-02-25 DIAGNOSIS — K219 Gastro-esophageal reflux disease without esophagitis: Secondary | ICD-10-CM | POA: Diagnosis present

## 2014-02-25 DIAGNOSIS — I129 Hypertensive chronic kidney disease with stage 1 through stage 4 chronic kidney disease, or unspecified chronic kidney disease: Secondary | ICD-10-CM | POA: Diagnosis present

## 2014-02-25 DIAGNOSIS — I2510 Atherosclerotic heart disease of native coronary artery without angina pectoris: Secondary | ICD-10-CM | POA: Diagnosis present

## 2014-02-25 DIAGNOSIS — R339 Retention of urine, unspecified: Secondary | ICD-10-CM | POA: Diagnosis present

## 2014-02-25 DIAGNOSIS — D62 Acute posthemorrhagic anemia: Secondary | ICD-10-CM | POA: Diagnosis not present

## 2014-02-25 DIAGNOSIS — M109 Gout, unspecified: Secondary | ICD-10-CM | POA: Diagnosis present

## 2014-02-25 DIAGNOSIS — R0602 Shortness of breath: Secondary | ICD-10-CM | POA: Diagnosis not present

## 2014-02-25 DIAGNOSIS — N183 Chronic kidney disease, stage 3 unspecified: Secondary | ICD-10-CM | POA: Diagnosis present

## 2014-02-25 DIAGNOSIS — R143 Flatulence: Secondary | ICD-10-CM | POA: Diagnosis not present

## 2014-02-25 DIAGNOSIS — Z01810 Encounter for preprocedural cardiovascular examination: Secondary | ICD-10-CM | POA: Diagnosis not present

## 2014-02-25 DIAGNOSIS — G4733 Obstructive sleep apnea (adult) (pediatric): Secondary | ICD-10-CM | POA: Diagnosis present

## 2014-02-25 DIAGNOSIS — Z8249 Family history of ischemic heart disease and other diseases of the circulatory system: Secondary | ICD-10-CM

## 2014-02-25 DIAGNOSIS — Z7982 Long term (current) use of aspirin: Secondary | ICD-10-CM

## 2014-02-25 DIAGNOSIS — F17200 Nicotine dependence, unspecified, uncomplicated: Secondary | ICD-10-CM | POA: Diagnosis present

## 2014-02-25 DIAGNOSIS — E119 Type 2 diabetes mellitus without complications: Secondary | ICD-10-CM | POA: Diagnosis present

## 2014-02-25 DIAGNOSIS — I214 Non-ST elevation (NSTEMI) myocardial infarction: Secondary | ICD-10-CM | POA: Diagnosis not present

## 2014-02-25 DIAGNOSIS — N179 Acute kidney failure, unspecified: Secondary | ICD-10-CM | POA: Diagnosis not present

## 2014-02-25 DIAGNOSIS — I5031 Acute diastolic (congestive) heart failure: Secondary | ICD-10-CM | POA: Diagnosis not present

## 2014-02-25 DIAGNOSIS — N390 Urinary tract infection, site not specified: Secondary | ICD-10-CM | POA: Diagnosis present

## 2014-02-25 DIAGNOSIS — N319 Neuromuscular dysfunction of bladder, unspecified: Secondary | ICD-10-CM | POA: Diagnosis present

## 2014-02-25 DIAGNOSIS — I252 Old myocardial infarction: Secondary | ICD-10-CM

## 2014-02-25 DIAGNOSIS — R141 Gas pain: Secondary | ICD-10-CM | POA: Diagnosis not present

## 2014-02-25 DIAGNOSIS — I2582 Chronic total occlusion of coronary artery: Secondary | ICD-10-CM | POA: Diagnosis not present

## 2014-02-25 DIAGNOSIS — I119 Hypertensive heart disease without heart failure: Secondary | ICD-10-CM | POA: Diagnosis present

## 2014-02-25 DIAGNOSIS — I6529 Occlusion and stenosis of unspecified carotid artery: Secondary | ICD-10-CM | POA: Diagnosis present

## 2014-02-25 DIAGNOSIS — E785 Hyperlipidemia, unspecified: Secondary | ICD-10-CM | POA: Diagnosis present

## 2014-02-25 DIAGNOSIS — N189 Chronic kidney disease, unspecified: Secondary | ICD-10-CM | POA: Diagnosis not present

## 2014-02-25 DIAGNOSIS — Z951 Presence of aortocoronary bypass graft: Secondary | ICD-10-CM

## 2014-02-25 DIAGNOSIS — D638 Anemia in other chronic diseases classified elsewhere: Secondary | ICD-10-CM | POA: Diagnosis present

## 2014-02-25 DIAGNOSIS — A498 Other bacterial infections of unspecified site: Secondary | ICD-10-CM | POA: Diagnosis present

## 2014-02-25 DIAGNOSIS — Z9861 Coronary angioplasty status: Secondary | ICD-10-CM

## 2014-02-25 DIAGNOSIS — J4489 Other specified chronic obstructive pulmonary disease: Secondary | ICD-10-CM | POA: Diagnosis not present

## 2014-02-25 DIAGNOSIS — I739 Peripheral vascular disease, unspecified: Secondary | ICD-10-CM

## 2014-02-25 DIAGNOSIS — I5033 Acute on chronic diastolic (congestive) heart failure: Secondary | ICD-10-CM | POA: Diagnosis not present

## 2014-02-25 DIAGNOSIS — K222 Esophageal obstruction: Secondary | ICD-10-CM | POA: Diagnosis present

## 2014-02-25 DIAGNOSIS — Z4682 Encounter for fitting and adjustment of non-vascular catheter: Secondary | ICD-10-CM | POA: Diagnosis not present

## 2014-02-25 DIAGNOSIS — I70219 Atherosclerosis of native arteries of extremities with intermittent claudication, unspecified extremity: Secondary | ICD-10-CM | POA: Diagnosis not present

## 2014-02-25 DIAGNOSIS — I10 Essential (primary) hypertension: Secondary | ICD-10-CM

## 2014-02-25 DIAGNOSIS — I509 Heart failure, unspecified: Secondary | ICD-10-CM | POA: Diagnosis present

## 2014-02-25 DIAGNOSIS — Z79899 Other long term (current) drug therapy: Secondary | ICD-10-CM

## 2014-02-25 DIAGNOSIS — Z7902 Long term (current) use of antithrombotics/antiplatelets: Secondary | ICD-10-CM

## 2014-02-25 DIAGNOSIS — R079 Chest pain, unspecified: Secondary | ICD-10-CM | POA: Diagnosis not present

## 2014-02-25 DIAGNOSIS — R072 Precordial pain: Secondary | ICD-10-CM | POA: Diagnosis not present

## 2014-02-25 DIAGNOSIS — R918 Other nonspecific abnormal finding of lung field: Secondary | ICD-10-CM | POA: Diagnosis not present

## 2014-02-25 DIAGNOSIS — Y832 Surgical operation with anastomosis, bypass or graft as the cause of abnormal reaction of the patient, or of later complication, without mention of misadventure at the time of the procedure: Secondary | ICD-10-CM | POA: Diagnosis present

## 2014-02-25 DIAGNOSIS — E1159 Type 2 diabetes mellitus with other circulatory complications: Secondary | ICD-10-CM | POA: Diagnosis present

## 2014-02-25 DIAGNOSIS — J811 Chronic pulmonary edema: Secondary | ICD-10-CM | POA: Diagnosis not present

## 2014-02-25 LAB — CBC
HEMATOCRIT: 34.9 % — AB (ref 39.0–52.0)
HEMOGLOBIN: 11.4 g/dL — AB (ref 13.0–17.0)
MCH: 26.6 pg (ref 26.0–34.0)
MCHC: 32.7 g/dL (ref 30.0–36.0)
MCV: 81.4 fL (ref 78.0–100.0)
Platelets: 231 10*3/uL (ref 150–400)
RBC: 4.29 MIL/uL (ref 4.22–5.81)
RDW: 14.3 % (ref 11.5–15.5)
WBC: 8.7 10*3/uL (ref 4.0–10.5)

## 2014-02-25 LAB — BASIC METABOLIC PANEL
BUN: 48 mg/dL — AB (ref 6–23)
CO2: 20 mEq/L (ref 19–32)
Calcium: 9.1 mg/dL (ref 8.4–10.5)
Chloride: 103 mEq/L (ref 96–112)
Creatinine, Ser: 1.68 mg/dL — ABNORMAL HIGH (ref 0.50–1.35)
GFR calc Af Amer: 45 mL/min — ABNORMAL LOW (ref >90)
GFR calc non Af Amer: 39 mL/min — ABNORMAL LOW (ref >90)
GLUCOSE: 250 mg/dL — AB (ref 70–99)
POTASSIUM: 5.4 meq/L — AB (ref 3.7–5.3)
Sodium: 135 mEq/L — ABNORMAL LOW (ref 137–147)

## 2014-02-25 LAB — I-STAT TROPONIN, ED: Troponin i, poc: 0.01 ng/mL (ref 0.00–0.08)

## 2014-02-25 LAB — TROPONIN I

## 2014-02-25 LAB — PRO B NATRIURETIC PEPTIDE: PRO B NATRI PEPTIDE: 182.4 pg/mL — AB (ref 0–125)

## 2014-02-25 MED ORDER — HEPARIN (PORCINE) IN NACL 100-0.45 UNIT/ML-% IJ SOLN
1200.0000 [IU]/h | INTRAMUSCULAR | Status: DC
  Administered 2014-02-26: 1500 [IU]/h via INTRAVENOUS
  Filled 2014-02-25 (×2): qty 250

## 2014-02-25 MED ORDER — HEPARIN BOLUS VIA INFUSION
3000.0000 [IU] | Freq: Once | INTRAVENOUS | Status: AC
  Administered 2014-02-26: 3000 [IU] via INTRAVENOUS
  Filled 2014-02-25: qty 3000

## 2014-02-25 NOTE — ED Notes (Signed)
Pt arrives via EMS from home. Hx 5 MI, 9 stents. Pt states none of his MI's have shown on EKG. For a couple hrs pt experienced sudden onset CP. Center of chest, states it feels like indigestion. States his other MI's felt like indigestion. 9/10 initially. Upon EMS arrival 4/10 post 4 NTG at home. 2 NTG with EMS. 324 ASA at home. No SOB, nausea. 18 LAC. 12 lead showed RBBB. VSS. 126/78 HR 90.

## 2014-02-25 NOTE — Progress Notes (Signed)
ANTICOAGULATION CONSULT NOTE - Initial Consult  Pharmacy Consult for heparin Indication: chest pain/ACS  No Known Allergies  Patient Measurements: Height: 5\' 6"  (167.6 cm) Weight: 164 lb (74.39 kg) IBW/kg (Calculated) : 63.8  Vital Signs: Temp: 97.7 F (36.5 C) (05/11 2246) Temp src: Oral (05/11 2246) BP: 145/63 mmHg (05/11 2246) Pulse Rate: 81 (05/11 2246)  Labs:  Recent Labs  02/25/14 2302  HGB 11.4*  HCT 34.9*  PLT 231    Estimated Creatinine Clearance: 33.5 ml/min (by C-G formula based on Cr of 1.8).   Medical History: Past Medical History  Diagnosis Date  . Carotid artery occlusion     Bilateral 50-69% stenoses of the internal carotid arteries. He needs carotid Dopplers every 6 months.  . Coronary artery disease     CABG in 1999. Stent in SVG to RCA in 1275 complicated by no reflow  . Diabetes mellitus without complication   . Hyperlipidemia   . Atherosclerosis of native arteries of the extremities with intermittent claudication Left external iliac artery stent in 2001    Bilateral SFA occlusions documented in 2001.  Marland Kitchen OSA (obstructive sleep apnea)   . Hypertension   . GERD (gastroesophageal reflux disease)   . MI (myocardial infarction)   . Coronary artery disease   . Non-STEMI (non-ST elevated myocardial infarction) 09/24/2013  . 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 1999   . CAD (coronary artery disease), native coronary artery 09/24/2013    Native circumflex Taxus stent 2007, angioplasty of the SVG to native right coronary 2006, DES stent to the SVG of RCA 2007 and resplinted 2009    . COPD (chronic obstructive pulmonary disease) 09/24/2013  . PVD (peripheral vascular disease) 09/24/2013  . Diabetes mellitus with complication 17/0/0174  . PAD (peripheral artery disease)   . History of urinary retention     has to perform self catheterizations  .  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 Esophageal stricture 09/24/2013    History of dilatation. Long-standing history of esophageal reflux     Assessment: 73yo male c/o indigestion since 1930, took ASA and NTG PTA, no c/o CP now and initial troponin negative but h/o MI x5, stenting x9, to begin heparin.  Goal of Therapy:  Heparin level 0.3-0.7 units/ml Monitor platelets by anticoagulation protocol: Yes   Plan:  Will give heparin 3000 units IV bolus followed by gtt at 1500 units/hr (previously required high rates without therapeutic level prior to d/c) and monitor heparin levels and CBC.  Wynona Neat, PharmD, BCPS  02/25/2014,11:36 PM

## 2014-02-25 NOTE — ED Provider Notes (Signed)
CSN: [id number]     Arrival date & time 02/25/14  2239 History   First MD Initiated Contact with Patient 02/25/14 2304     Chief Complaint  Patient presents with  . Chest Pain      HPI Hx of CAD with multiple stents. Here with "indigestion" that began around 730pm. Asa and nitro PTA. Stent x 5 per pt. ASA PTA. SOB without nausea or vomiting. No recent illness. Cardiologist is Dr Daneen Schick. Last heart cath per pt was 2013. Hx of CHF. On ASA and plavix. No recent cough or congestion. Believes he had about 3 hrs of pain tonight. No pain or indigestion sensation at this time.    Past Medical History  Diagnosis Date  . Carotid artery occlusion     Bilateral 50-69% stenoses of the internal carotid arteries. He needs carotid Dopplers every 6 months.  . Coronary artery disease     CABG in 1999. Stent in SVG to RCA in 4098 complicated by no reflow  . Diabetes mellitus without complication   . Hyperlipidemia   . Atherosclerosis of native arteries of the extremities with intermittent claudication Left external iliac artery stent in 2001    Bilateral SFA occlusions documented in 2001.  Marland Kitchen OSA (obstructive sleep apnea)   . Hypertension   . GERD (gastroesophageal reflux disease)   . MI (myocardial infarction)   . Coronary artery disease   . Non-STEMI (non-ST elevated myocardial infarction) 09/24/2013  . 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 1999   . CAD (coronary artery disease), native coronary artery 09/24/2013    Native circumflex Taxus stent 2007, angioplasty of the SVG to native right coronary 2006, DES stent to the SVG of RCA 2007 and resplinted 2009    . COPD (chronic obstructive pulmonary disease) 09/24/2013  . PVD (peripheral vascular disease) 09/24/2013  . Diabetes mellitus with complication 08/26/1477  . PAD (peripheral artery disease)   . History of urinary retention     has to  perform self catheterizations  . 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 Esophageal stricture 09/24/2013    History of dilatation. Long-standing history of esophageal reflux    Past Surgical History  Procedure Laterality Date  . Coronary stent placement    . Coronary artery bypass graft  1999  . Cardiac catheterization     Family History  Problem Relation Age of Onset  . CAD Mother   . Cirrhosis Brother    History  Substance Use Topics  . Smoking status: Current Some Day Smoker -- 1.00 packs/day for 50 years    Last Attempt to Quit: 07/02/2013  . Smokeless tobacco: Never Used     Comment: QUIT ON SEPTEMBER 2014  . Alcohol Use: No    Review of Systems  All other systems reviewed and are negative.     Allergies  Review of patient's allergies indicates no known allergies.  Home Medications   Prior to Admission medications   Medication Sig Start Date End Date Taking? Authorizing Provider  acetaminophen (TYLENOL) 325 MG tablet Take 650 mg by mouth every 6 (six) hours as needed for moderate pain.    Historical Provider, MD  amLODipine (NORVASC) 10 MG tablet Take 10 mg by mouth daily.    Historical Provider, MD  aspirin EC 325 MG  tablet Take 325 mg by mouth daily.    Historical Provider, MD  atorvastatin (LIPITOR) 20 MG tablet Take 20 mg by mouth daily.    Historical Provider, MD  cholecalciferol (VITAMIN D) 1000 UNITS tablet Take 1,000 Units by mouth daily.    Historical Provider, MD  clopidogrel (PLAVIX) 75 MG tablet Take 75 mg by mouth daily with breakfast.    Historical Provider, MD  furosemide (LASIX) 40 MG tablet Take 1 tablet (40 mg total) by mouth daily. 10/08/13   Belva Crome III, MD  glipiZIDE (GLUCOTROL XL) 5 MG 24 hr tablet Take 5 mg by mouth daily with breakfast.    Historical Provider, MD  irbesartan (AVAPRO) 300 MG tablet Take 1 tablet (300 mg total) by mouth daily. 09/27/13   Belva Crome III,  MD  metFORMIN (GLUCOPHAGE) 1000 MG tablet Take 1,000 mg by mouth 2 (two) times daily with a meal.    Historical Provider, MD  metoprolol succinate (TOPROL-XL) 50 MG 24 hr tablet Take 1 tablet (50 mg total) by mouth daily. 08/15/13   Jettie Booze, MD  NITROSTAT 0.4 MG SL tablet TAKE 1 TABLET SUBLINGUALLY AS NEEDED AS DIRECTED 10/13/13   Belva Crome III, MD  omeprazole (PRILOSEC) 20 MG capsule Take 20 mg by mouth daily.    Historical Provider, MD  potassium chloride (KLOR-CON 10) 10 MEQ tablet Take 1 tablet (10 mEq total) by mouth daily. 09/27/13   Belva Crome III, MD   BP 145/63  Pulse 81  Temp(Src) 97.7 F (36.5 C) (Oral)  Resp 17  Ht 5\' 6"  (1.676 m)  Wt 164 lb (74.39 kg)  BMI 26.48 kg/m2  SpO2 98% Physical Exam  Nursing note and vitals reviewed. Constitutional: He is oriented to person, place, and time. He appears well-developed and well-nourished.  HENT:  Head: Normocephalic and atraumatic.  Eyes: EOM are normal.  Neck: Normal range of motion.  Cardiovascular: Normal rate, regular rhythm, normal heart sounds and intact distal pulses.   Pulmonary/Chest: Effort normal and breath sounds normal. No respiratory distress.  Abdominal: Soft. He exhibits no distension. There is no tenderness.  Musculoskeletal: Normal range of motion.  Neurological: He is alert and oriented to person, place, and time.  Skin: Skin is warm and dry.  Psychiatric: He has a normal mood and affect. Judgment normal.    ED Course  Procedures (including critical care time)  CRITICAL CARE Performed by: Hoy Morn Total critical care time: 30 Critical care time was exclusive of separately billable procedures and treating other patients. Critical care was necessary to treat or prevent imminent or life-threatening deterioration. Critical care was time spent personally by me on the following activities: development of treatment plan with patient and/or surrogate as well as nursing, discussions with  consultants, evaluation of patient's response to treatment, examination of patient, obtaining history from patient or surrogate, ordering and performing treatments and interventions, ordering and review of laboratory studies, ordering and review of radiographic studies, pulse oximetry and re-evaluation of patient's condition.  Labs Review Labs Reviewed  CBC - Abnormal; Notable for the following:    Hemoglobin 11.4 (*)    HCT 34.9 (*)    All other components within normal limits  BASIC METABOLIC PANEL  PROTIME-INR  TROPONIN I  PRO B NATRIURETIC PEPTIDE  I-STAT TROPOININ, ED    Imaging Review Dg Chest Port 1 View  02/25/2014   CLINICAL DATA:  Chest pain  EXAM: PORTABLE CHEST - 1 VIEW  COMPARISON:  03/20/2013  FINDINGS: The cardiac shadow is within normal limits. Postsurgical changes are again seen. The lungs are clear bilaterally. No bony abnormality is noted.  IMPRESSION: No acute abnormality seen.   Electronically Signed   By: Inez Catalina M.D.   On: 02/25/2014 23:12     EKG Interpretation   Date/Time:  Monday Feb 25 2014 22:43:27 EDT Ventricular Rate:  83 PR Interval:  183 QRS Duration: 135 QT Interval:  384 QTC Calculation: 451 R Axis:   -82 Text Interpretation:  Sinus rhythm Right bundle branch block Inferior  infarct, old No significant change was found Confirmed by Mareon Robinette  MD,  Lennette Bihari ([id number]) on 02/25/2014 11:06:21 PM      MDM   Final diagnoses:  Unstable angina    Concerning for ACS given past hx and hx of symptoms tonight. Will start on heparin at this time. No pain currently. ecg unchanged. Admit to cardiology    Hoy Morn, MD 02/25/14 2322

## 2014-02-26 ENCOUNTER — Encounter (HOSPITAL_COMMUNITY)
Admission: EM | Disposition: A | Discharge status: Home Health | Payer: Self-pay | Source: Home / Self Care | Attending: Surgery

## 2014-02-26 DIAGNOSIS — N189 Chronic kidney disease, unspecified: Secondary | ICD-10-CM

## 2014-02-26 DIAGNOSIS — I739 Peripheral vascular disease, unspecified: Secondary | ICD-10-CM

## 2014-02-26 DIAGNOSIS — N183 Chronic kidney disease, stage 3 unspecified: Secondary | ICD-10-CM | POA: Diagnosis present

## 2014-02-26 DIAGNOSIS — I2510 Atherosclerotic heart disease of native coronary artery without angina pectoris: Secondary | ICD-10-CM

## 2014-02-26 DIAGNOSIS — I200 Unstable angina: Secondary | ICD-10-CM

## 2014-02-26 HISTORY — PX: LEFT HEART CATHETERIZATION WITH CORONARY/GRAFT ANGIOGRAM: SHX5450

## 2014-02-26 LAB — HEMOGLOBIN A1C
Hgb A1c MFr Bld: 7.4 % — ABNORMAL HIGH (ref <5.7)
Mean Plasma Glucose: 166 mg/dL — ABNORMAL HIGH (ref <117)

## 2014-02-26 LAB — PROTIME-INR
INR: 0.97 (ref 0.00–1.49)
INR: 1.07 (ref 0.00–1.49)
PROTHROMBIN TIME: 12.7 s (ref 11.6–15.2)
PROTHROMBIN TIME: 13.7 s (ref 11.6–15.2)

## 2014-02-26 LAB — URINALYSIS, ROUTINE W REFLEX MICROSCOPIC
Bilirubin Urine: NEGATIVE
Glucose, UA: 100 mg/dL — AB
Ketones, ur: NEGATIVE mg/dL
NITRITE: POSITIVE — AB
PROTEIN: 100 mg/dL — AB
Specific Gravity, Urine: 1.022 (ref 1.005–1.030)
Urobilinogen, UA: 0.2 mg/dL (ref 0.0–1.0)
pH: 5 (ref 5.0–8.0)

## 2014-02-26 LAB — URINE MICROSCOPIC-ADD ON

## 2014-02-26 LAB — CBC
HCT: 34 % — ABNORMAL LOW (ref 39.0–52.0)
HCT: 32.9 % — ABNORMAL LOW (ref 39.0–52.0)
HEMOGLOBIN: 11.2 g/dL — AB (ref 13.0–17.0)
HEMOGLOBIN: 10.8 g/dL — AB (ref 13.0–17.0)
MCH: 26.7 pg (ref 26.0–34.0)
MCH: 26.5 pg (ref 26.0–34.0)
MCHC: 32.9 g/dL (ref 30.0–36.0)
MCHC: 32.8 g/dL (ref 30.0–36.0)
MCV: 81.1 fL (ref 78.0–100.0)
MCV: 80.8 fL (ref 78.0–100.0)
PLATELETS: 232 10*3/uL (ref 150–400)
PLATELETS: 219 10*3/uL (ref 150–400)
RBC: 4.19 MIL/uL — ABNORMAL LOW (ref 4.22–5.81)
RBC: 4.07 MIL/uL — AB (ref 4.22–5.81)
RDW: 14.3 % (ref 11.5–15.5)
RDW: 14.3 % (ref 11.5–15.5)
WBC: 7.8 10*3/uL (ref 4.0–10.5)
WBC: 6.9 10*3/uL (ref 4.0–10.5)

## 2014-02-26 LAB — MRSA PCR SCREENING: MRSA by PCR: NEGATIVE

## 2014-02-26 LAB — GLUCOSE, CAPILLARY
Glucose-Capillary: 71 mg/dL (ref 70–99)
Glucose-Capillary: 96 mg/dL (ref 70–99)

## 2014-02-26 LAB — BASIC METABOLIC PANEL
BUN: 39 mg/dL — AB (ref 6–23)
CALCIUM: 8.8 mg/dL (ref 8.4–10.5)
CO2: 22 mEq/L (ref 19–32)
Chloride: 109 mEq/L (ref 96–112)
Creatinine, Ser: 1.49 mg/dL — ABNORMAL HIGH (ref 0.50–1.35)
GFR calc Af Amer: 52 mL/min — ABNORMAL LOW (ref >90)
GFR, EST NON AFRICAN AMERICAN: 45 mL/min — AB (ref >90)
GLUCOSE: 93 mg/dL (ref 70–99)
Potassium: 5 mEq/L (ref 3.7–5.3)
Sodium: 144 mEq/L (ref 137–147)

## 2014-02-26 LAB — TROPONIN I
Troponin I: 0.37 ng/mL (ref <0.30)
Troponin I: 0.49 ng/mL (ref <0.30)
Troponin I: 0.42 ng/mL (ref <0.30)

## 2014-02-26 LAB — TSH: TSH: 2.75 u[IU]/mL (ref 0.350–4.500)

## 2014-02-26 LAB — PLATELET INHIBITION P2Y12: PLATELET FUNCTION P2Y12: 301 [PRU] (ref 194–418)

## 2014-02-26 LAB — HEPARIN LEVEL (UNFRACTIONATED): HEPARIN UNFRACTIONATED: 1.07 [IU]/mL — AB (ref 0.30–0.70)

## 2014-02-26 SURGERY — LEFT HEART CATHETERIZATION WITH CORONARY/GRAFT ANGIOGRAM

## 2014-02-26 MED ORDER — NITROGLYCERIN IN D5W 200-5 MCG/ML-% IV SOLN
2.0000 ug/min | INTRAVENOUS | Status: DC
  Administered 2014-02-27 (×2): 150 ug/min via INTRAVENOUS
  Administered 2014-02-28 – 2014-03-01 (×3): 100 ug/min via INTRAVENOUS
  Filled 2014-02-26 (×5): qty 250

## 2014-02-26 MED ORDER — MIDAZOLAM HCL 2 MG/2ML IJ SOLN
INTRAMUSCULAR | Status: AC
  Filled 2014-02-26: qty 2

## 2014-02-26 MED ORDER — AMLODIPINE BESYLATE 10 MG PO TABS
10.0000 mg | ORAL_TABLET | Freq: Every day | ORAL | Status: DC
  Administered 2014-02-26 – 2014-02-28 (×3): 10 mg via ORAL
  Filled 2014-02-26 (×4): qty 1

## 2014-02-26 MED ORDER — SODIUM CHLORIDE 0.9 % IV SOLN: INTRAVENOUS | Status: DC

## 2014-02-26 MED ORDER — INSULIN ASPART 100 UNIT/ML ~~LOC~~ SOLN: 0.0000 [IU] | Freq: Three times a day (TID) | SUBCUTANEOUS | Status: DC

## 2014-02-26 MED ORDER — INSULIN ASPART 100 UNIT/ML ~~LOC~~ SOLN
0.0000 [IU] | Freq: Every day | SUBCUTANEOUS | Status: DC
  Administered 2014-02-26: 2 [IU] via SUBCUTANEOUS

## 2014-02-26 MED ORDER — ATORVASTATIN CALCIUM 20 MG PO TABS
20.0000 mg | ORAL_TABLET | Freq: Every day | ORAL | Status: DC
Start: 2014-02-26 — End: 2014-03-07
  Administered 2014-02-26 – 2014-03-07 (×9): 20 mg via ORAL
  Filled 2014-02-26 (×10): qty 1

## 2014-02-26 MED ORDER — CARVEDILOL 12.5 MG PO TABS
12.5000 mg | ORAL_TABLET | Freq: Two times a day (BID) | ORAL | Status: DC
  Administered 2014-02-26 – 2014-02-27 (×2): 12.5 mg via ORAL
  Filled 2014-02-26 (×5): qty 1

## 2014-02-26 MED ORDER — NITROGLYCERIN 0.4 MG SL SUBL: 0.4000 mg | SUBLINGUAL_TABLET | SUBLINGUAL | Status: DC | PRN

## 2014-02-26 MED ORDER — HYDRALAZINE HCL 25 MG PO TABS
25.0000 mg | ORAL_TABLET | Freq: Three times a day (TID) | ORAL | Status: DC
  Administered 2014-02-26 – 2014-02-28 (×6): 25 mg via ORAL
  Filled 2014-02-26 (×11): qty 1

## 2014-02-26 MED ORDER — SODIUM CHLORIDE 0.9 % IV SOLN
INTRAVENOUS | Status: AC
  Administered 2014-02-26: 75 mL/h via INTRAVENOUS

## 2014-02-26 MED ORDER — NITROGLYCERIN 0.2 MG/ML ON CALL CATH LAB
INTRAVENOUS | Status: AC
  Filled 2014-02-26: qty 1

## 2014-02-26 MED ORDER — DIAZEPAM 5 MG PO TABS
5.0000 mg | ORAL_TABLET | ORAL | Status: AC
  Administered 2014-02-26: 5 mg via ORAL
  Filled 2014-02-26: qty 1

## 2014-02-26 MED ORDER — HEPARIN (PORCINE) IN NACL 100-0.45 UNIT/ML-% IJ SOLN
1000.0000 [IU]/h | INTRAMUSCULAR | Status: DC
  Administered 2014-02-26 – 2014-02-28 (×3): 1000 [IU]/h via INTRAVENOUS
  Filled 2014-02-26 (×5): qty 250

## 2014-02-26 MED ORDER — FENTANYL CITRATE 0.05 MG/ML IJ SOLN
INTRAMUSCULAR | Status: AC
  Filled 2014-02-26: qty 2

## 2014-02-26 MED ORDER — ASPIRIN EC 325 MG PO TBEC: 325.0000 mg | DELAYED_RELEASE_TABLET | Freq: Every day | ORAL | Status: DC

## 2014-02-26 MED ORDER — SODIUM CHLORIDE 0.9 % IJ SOLN: 3.0000 mL | Freq: Two times a day (BID) | INTRAMUSCULAR | Status: DC

## 2014-02-26 MED ORDER — NITROGLYCERIN IN D5W 200-5 MCG/ML-% IV SOLN
INTRAVENOUS | Status: AC
  Filled 2014-02-26: qty 250

## 2014-02-26 MED ORDER — SODIUM CHLORIDE 0.9 % IV SOLN: 250.0000 mL | INTRAVENOUS | Status: DC | PRN

## 2014-02-26 MED ORDER — HEPARIN (PORCINE) IN NACL 2-0.9 UNIT/ML-% IJ SOLN
INTRAMUSCULAR | Status: AC
  Filled 2014-02-26: qty 1000

## 2014-02-26 MED ORDER — ACETAMINOPHEN 325 MG PO TABS: 650.0000 mg | ORAL_TABLET | Freq: Four times a day (QID) | ORAL | Status: DC | PRN

## 2014-02-26 MED ORDER — INSULIN ASPART 100 UNIT/ML ~~LOC~~ SOLN
0.0000 [IU] | Freq: Three times a day (TID) | SUBCUTANEOUS | Status: DC
  Administered 2014-02-27: 5 [IU] via SUBCUTANEOUS
  Administered 2014-02-27: 2 [IU] via SUBCUTANEOUS
  Administered 2014-02-27 – 2014-02-28 (×4): 5 [IU] via SUBCUTANEOUS

## 2014-02-26 MED ORDER — SODIUM CHLORIDE 0.9 % IV SOLN
INTRAVENOUS | Status: DC
  Administered 2014-02-26: 100 mL/h via INTRAVENOUS
  Administered 2014-02-27 – 2014-02-28 (×3): via INTRAVENOUS

## 2014-02-26 MED ORDER — SODIUM CHLORIDE 0.9 % IJ SOLN: 3.0000 mL | INTRAMUSCULAR | Status: DC | PRN

## 2014-02-26 MED ORDER — PANTOPRAZOLE SODIUM 40 MG PO TBEC
40.0000 mg | DELAYED_RELEASE_TABLET | Freq: Every day | ORAL | Status: DC
  Administered 2014-02-26 – 2014-02-28 (×3): 40 mg via ORAL
  Filled 2014-02-26 (×3): qty 1

## 2014-02-26 MED ORDER — ASPIRIN 81 MG PO CHEW: 324.0000 mg | CHEWABLE_TABLET | ORAL | Status: AC

## 2014-02-26 MED ORDER — ASPIRIN 81 MG PO CHEW
324.0000 mg | CHEWABLE_TABLET | ORAL | Status: AC
  Administered 2014-02-26: 324 mg via ORAL
  Filled 2014-02-26: qty 4

## 2014-02-26 MED ORDER — ASPIRIN 300 MG RE SUPP
300.0000 mg | RECTAL | Status: DC
  Filled 2014-02-26: qty 1

## 2014-02-26 MED ORDER — OXYCODONE-ACETAMINOPHEN 5-325 MG PO TABS
1.0000 | ORAL_TABLET | ORAL | Status: DC | PRN
  Administered 2014-02-27 – 2014-03-01 (×5): 2 via ORAL
  Filled 2014-02-26 (×5): qty 2

## 2014-02-26 MED ORDER — LIDOCAINE HCL (PF) 1 % IJ SOLN
INTRAMUSCULAR | Status: AC
  Filled 2014-02-26: qty 30

## 2014-02-26 MED ORDER — CLOPIDOGREL BISULFATE 75 MG PO TABS
75.0000 mg | ORAL_TABLET | Freq: Every day | ORAL | Status: DC
  Administered 2014-02-26: 75 mg via ORAL
  Filled 2014-02-26 (×2): qty 1

## 2014-02-26 MED ORDER — ASPIRIN EC 81 MG PO TBEC
81.0000 mg | DELAYED_RELEASE_TABLET | Freq: Every day | ORAL | Status: DC
  Administered 2014-02-27 – 2014-02-28 (×2): 81 mg via ORAL
  Filled 2014-02-26 (×3): qty 1

## 2014-02-26 NOTE — H&P (View-Only) (Signed)
Patient Name: Neill Loft Date of Encounter: 02/26/2014  Principal Problem:   Non-ST elevation myocardial infarction (NSTEMI), initial episode of care Active Problems:   Hypertension   Diabetes mellitus with complication   Hyperkalemia   CKD (chronic kidney disease) stage 3, GFR 30-59 ml/min    Patient Profile: 73 yo male w/ extensive hx CAD, MI x 5, CHF, CKD was admitted 05/12 w/ chest pain, ez trending up.  SUBJECTIVE: No more chest pain, no SOB  OBJECTIVE Filed Vitals:   02/26/14 0102 02/26/14 0138 02/26/14 0500 02/26/14 0630  BP: 134/61 175/72  141/58  Pulse: 72 84  65  Temp:  97.7 F (36.5 C)    TempSrc:  Oral    Resp: 14 19    Height:  5\' 6"  (1.676 m)    Weight:  167 lb 15.9 oz (76.2 kg) 167 lb 15.9 oz (76.2 kg)   SpO2: 96% 99%     No intake or output data in the 24 hours ending 02/26/14 1004 Filed Weights   02/25/14 2246 02/26/14 0138 02/26/14 0500  Weight: 164 lb (74.39 kg) 167 lb 15.9 oz (76.2 kg) 167 lb 15.9 oz (76.2 kg)    PHYSICAL EXAM General: Well developed, well nourished, male in no acute distress. Head: Normocephalic, atraumatic.  Neck: Supple without bruits, JVD not elevated. Lungs:  Resp regular and unlabored, CTA. Heart: RRR, S1, S2, no S3, S4, or murmur; no rub. Abdomen: Soft, non-tender, non-distended, BS + x 4.  Extremities: No clubbing, cyanosis, no edema.  Neuro: Alert and oriented X 3. Moves all extremities spontaneously. Psych: Normal affect.  LABS: CBC:  Recent Labs  02/26/14 0343 02/26/14 0834  WBC 7.8 6.9  HGB 11.2* 10.8*  HCT 34.0* 32.9*  MCV 81.1 80.8  PLT 232 219   INR:  Recent Labs  02/26/14 0834  INR 9.02   Basic Metabolic Panel:  Recent Labs  02/25/14 2302 02/26/14 0834  NA 135* 144  K 5.4* 5.0  CL 103 109  CO2 20 22  GLUCOSE 250* 93  BUN 48* 39*  CREATININE 1.68* 1.49*  CALCIUM 9.1 8.8   Cardiac Enzymes:  Recent Labs  02/25/14 2302 02/26/14 0343 02/26/14 0834  TROPONINI <0.30  0.37* 0.49*    Recent Labs  02/25/14 2308  TROPIPOC 0.01   BNP: Pro B Natriuretic peptide (BNP)  Date/Time Value Ref Range Status  02/25/2014 11:02 PM 182.4* 0 - 125 pg/mL Final  09/24/2013  3:55 AM 5410.0* 0 - 125 pg/mL Final   Thyroid Function Tests:  Recent Labs  02/26/14 0343  TSH 2.750   TELE:        Radiology/Studies: Dg Chest Port 1 View 02/25/2014   CLINICAL DATA:  Chest pain  EXAM: PORTABLE CHEST - 1 VIEW  COMPARISON:  03/20/2013  FINDINGS: The cardiac shadow is within normal limits. Postsurgical changes are again seen. The lungs are clear bilaterally. No bony abnormality is noted.  IMPRESSION: No acute abnormality seen.   Electronically Signed   By: Inez Catalina M.D.   On: 02/25/2014 23:12    Current Medications:  . amLODipine  10 mg Oral Daily  . aspirin  324 mg Oral NOW   Or  . aspirin  300 mg Rectal NOW  . [START ON 02/27/2014] aspirin EC  81 mg Oral Daily  . atorvastatin  20 mg Oral Daily  . carvedilol  12.5 mg Oral BID WC  . clopidogrel  75 mg Oral Q breakfast  . pantoprazole  40 mg Oral Daily   . sodium chloride 75 mL/hr (02/26/14 0215)  . heparin 1,500 Units/hr (02/26/14 0044)    ASSESSMENT AND PLAN: Principal Problem:   Non-ST elevation myocardial infarction (NSTEMI), initial episode of care - on ASA, heparin, BB, statin, Plavix. Will ck p2y12 since back in w/ MI. The risks and benefits of a cardiac catheterization including, but not limited to, death, stroke, MI, kidney damage and bleeding were discussed with the patient and his wife who indicate understanding and agree to proceed.   Active Problems:   Hypertension - Lasix 40 mg and Avapro 300 mg held on admit due to renal function. Will add hydralazine for now, MD advise on continuing this at d/c.    Diabetes mellitus with complication - oral meds on hold. Add SSI, Ck A1c    Hyperkalemia - improved w/ hydration. May need to decrease ARB    CKD (chronic kidney disease) stage 3, GFR 30-59 ml/min -  improved w/ hydration and med changes.    Anemia - likely of chronic disease. Follow.   Acquanetta Belling Barrett , PA-C  10:04 AM 02/26/2014

## 2014-02-26 NOTE — Interval H&P Note (Signed)
Cath Lab Visit (complete for each Cath Lab visit)  Clinical Evaluation Leading to the Procedure:   ACS: yes  Non-ACS:    Anginal Classification: CCS IV  Anti-ischemic medical therapy: Maximal Therapy (2 or more classes of medications)  Non-Invasive Test Results: No non-invasive testing performed  Prior CABG: Previous CABG      History and Physical Interval Note:  02/26/2014 3:01 PM  [name]  has presented today for surgery, with the diagnosis of cp  The various methods of treatment have been discussed with the patient and family. After consideration of risks, benefits and other options for treatment, the patient has consented to  Procedure(s): LEFT HEART CATHETERIZATION WITH CORONARY ANGIOGRAM (N/A) as a surgical intervention .  The patient's history has been reviewed, patient examined, no change in status, stable for surgery.  I have reviewed the patient's chart and labs.  Questions were answered to the patient's satisfaction.     Belva Crome III

## 2014-02-26 NOTE — Progress Notes (Signed)
ANTICOAGULATION CONSULT NOTE - Follow Up Consult  Pharmacy Consult for Heparin Indication: chest pain/ACS  No Known Allergies  Patient Measurements: Height: 5\' 6"  (167.6 cm) Weight: 167 lb 15.9 oz (76.2 kg) (patient weighed this am on admission) IBW/kg (Calculated) : 63.8 Heparin Dosing Weight: 76 kg  Vital Signs: Temp: 97.7 F (36.5 C) (05/12 0138) Temp src: Oral (05/12 0138) BP: 141/58 mmHg (05/12 0630) Pulse Rate: 65 (05/12 0630)  Labs:  Recent Labs  02/25/14 2302 02/26/14 0343 02/26/14 0834  HGB 11.4* 11.2* 10.8*  HCT 34.9* 34.0* 32.9*  PLT 231 232 219  LABPROT 12.7  --  13.7  INR 0.97  --  1.07  HEPARINUNFRC  --   --  1.07*  CREATININE 1.68*  --  1.49*  TROPONINI <0.30 0.37* 0.49*    Estimated Creatinine Clearance: 40.4 ml/min (by C-G formula based on Cr of 1.49).  Assessment:   Initial heparin level is supratherapeutic (1.07) on 1500 units/hr.  Drawn from opposite arm from IV site.  No bleeding noted. Troponins rising; for cath later today.  Goal of Therapy:  Heparin level 0.3-0.7 units/ml Monitor platelets by anticoagulation protocol: Yes   Plan:   Decrease heparin drip to 1200 units/hr.  Will follow-up post-cath.  Arty Baumgartner, [place] Pager: [phone] 02/26/2014,10:49 AM

## 2014-02-26 NOTE — Progress Notes (Signed)
Pt. Seen and examined. Agree with the NP/PA-C note as written.  Plan for LHC today - troponin is rising, no chest pain currently - he is agreeable.  Need to hydrate and possibly will need staged PCI.  Pixie Casino, MD, [facility] Attending Cardiologist [place]

## 2014-02-26 NOTE — Progress Notes (Signed)
CRITICAL VALUE ALERT  Critical value received:  Troponin 0.37  Date of notification:  02/26/2014  Time of notification:  0556  Critical value read back:yes  Nurse who received alert:  Darel Hong, RN  MD notified (1st page):  Dr. Marigene Ehlers   Time of first page:  [phone]  MD notified (2nd page):  Time of second page:  Responding MD: Dr. Marigene Ehlers  Time MD responded: 0600  Keep patient NPO on fluids and Heparin. Await creatinine levels this am. Possible cath lab today.   Lorelee Cover Herschell Virani,RN

## 2014-02-26 NOTE — Progress Notes (Signed)
Patient Name: [name] Date of Encounter: 02/26/2014  Principal Problem:   Non-ST elevation myocardial infarction (NSTEMI), initial episode of care Active Problems:   Hypertension   Diabetes mellitus with complication   Hyperkalemia   CKD (chronic kidney disease) stage 3, GFR 30-59 ml/min    Patient Profile: 73 yo male w/ extensive hx CAD, MI x 5, CHF, CKD was admitted 05/12 w/ chest pain, ez trending up.  SUBJECTIVE: No more chest pain, no SOB  OBJECTIVE Filed Vitals:   02/26/14 0102 02/26/14 0138 02/26/14 0500 02/26/14 0630  BP: 134/61 175/72  141/58  Pulse: 72 84  65  Temp:  97.7 F (36.5 C)    TempSrc:  Oral    Resp: 14 19    Height:  5\' 6"  (1.676 m)    Weight:  167 lb 15.9 oz (76.2 kg) 167 lb 15.9 oz (76.2 kg)   SpO2: 96% 99%     No intake or output data in the 24 hours ending 02/26/14 1004 Filed Weights   02/25/14 2246 02/26/14 0138 02/26/14 0500  Weight: 164 lb (74.39 kg) 167 lb 15.9 oz (76.2 kg) 167 lb 15.9 oz (76.2 kg)    PHYSICAL EXAM General: Well developed, well nourished, male in no acute distress. Head: Normocephalic, atraumatic.  Neck: Supple without bruits, JVD not elevated. Lungs:  Resp regular and unlabored, CTA. Heart: RRR, S1, S2, no S3, S4, or murmur; no rub. Abdomen: Soft, non-tender, non-distended, BS + x 4.  Extremities: No clubbing, cyanosis, no edema.  Neuro: Alert and oriented X 3. Moves all extremities spontaneously. Psych: Normal affect.  LABS: CBC:  Recent Labs  02/26/14 0343 02/26/14 0834  WBC 7.8 6.9  HGB 11.2* 10.8*  HCT 34.0* 32.9*  MCV 81.1 80.8  PLT 232 219   INR:  Recent Labs  02/26/14 0834  INR 9.02   Basic Metabolic Panel:  Recent Labs  02/25/14 2302 02/26/14 0834  NA 135* 144  K 5.4* 5.0  CL 103 109  CO2 20 22  GLUCOSE 250* 93  BUN 48* 39*  CREATININE 1.68* 1.49*  CALCIUM 9.1 8.8   Cardiac Enzymes:  Recent Labs  02/25/14 2302 02/26/14 0343 02/26/14 0834  TROPONINI <0.30  0.37* 0.49*    Recent Labs  02/25/14 2308  TROPIPOC 0.01   BNP: Pro B Natriuretic peptide (BNP)  Date/Time Value Ref Range Status  02/25/2014 11:02 PM 182.4* 0 - 125 pg/mL Final  09/24/2013  3:55 AM 5410.0* 0 - 125 pg/mL Final   Thyroid Function Tests:  Recent Labs  02/26/14 0343  TSH 2.750   TELE:        Radiology/Studies: Dg Chest Port 1 View 02/25/2014   CLINICAL DATA:  Chest pain  EXAM: PORTABLE CHEST - 1 VIEW  COMPARISON:  03/20/2013  FINDINGS: The cardiac shadow is within normal limits. Postsurgical changes are again seen. The lungs are clear bilaterally. No bony abnormality is noted.  IMPRESSION: No acute abnormality seen.   Electronically Signed   By: Inez Catalina M.D.   On: 02/25/2014 23:12    Current Medications:  . amLODipine  10 mg Oral Daily  . aspirin  324 mg Oral NOW   Or  . aspirin  300 mg Rectal NOW  . [START ON 02/27/2014] aspirin EC  81 mg Oral Daily  . atorvastatin  20 mg Oral Daily  . carvedilol  12.5 mg Oral BID WC  . clopidogrel  75 mg Oral Q breakfast  . pantoprazole  40 mg Oral Daily   . sodium chloride 75 mL/hr (02/26/14 0215)  . heparin 1,500 Units/hr (02/26/14 0044)    ASSESSMENT AND PLAN: Principal Problem:   Non-ST elevation myocardial infarction (NSTEMI), initial episode of care - on ASA, heparin, BB, statin, Plavix. Will ck p2y12 since back in w/ MI. The risks and benefits of a cardiac catheterization including, but not limited to, death, stroke, MI, kidney damage and bleeding were discussed with the patient and his wife who indicate understanding and agree to proceed.   Active Problems:   Hypertension - Lasix 40 mg and Avapro 300 mg held on admit due to renal function. Will add hydralazine for now, MD advise on continuing this at d/c.    Diabetes mellitus with complication - oral meds on hold. Add SSI, Ck A1c    Hyperkalemia - improved w/ hydration. May need to decrease ARB    CKD (chronic kidney disease) stage 3, GFR 30-59 ml/min -  improved w/ hydration and med changes.    Anemia - likely of chronic disease. Follow.   Acquanetta Belling [facility] , PA-C  10:04 AM 02/26/2014

## 2014-02-26 NOTE — Progress Notes (Signed)
Inpatient Diabetes Program Recommendations  AACE/ADA: New Consensus Statement on Inpatient Glycemic Control (2013)  Target Ranges:  Prepandial:   less than 140 mg/dL      Peak postprandial:   less than 180 mg/dL (1-2 hours)      Critically ill patients:  140 - 180 mg/dL   Pt has hx of DM. Pt takes Januvia at home/facility and metformin 1000 bid. Inpatient Diabetes Program Recommendations Correction (SSI): Please check cbg's q 4 hrs while NPO and then tidwc once eating.  Please use sensitive correction scale.  Thank you, Rosita Kea, RN, CNS, Diabetes Coordinator [phone])

## 2014-02-26 NOTE — H&P (View-Only) (Signed)
Pt. Seen and examined. Agree with the NP/PA-C note as written.  Plan for LHC today - troponin is rising, no chest pain currently - he is agreeable.  Need to hydrate and possibly will need staged PCI.  Kenneth C. Hilty, MD, FACC Attending Cardiologist [facility]   

## 2014-02-26 NOTE — Care Management Note (Addendum)
    Page 1 of 2   03/07/2014     4:05:30 PM CARE MANAGEMENT NOTE 03/07/2014  Patient:  [name], [name]   Account Number:  [id number]  Date Initiated:  02/26/2014  Documentation initiated by:  Elissa Hefty  Subjective/Objective Assessment:   adm w angina     Action/Plan:   lives w wife, pcp dr Shirline Frees   Anticipated DC Date:  03/07/2014   Anticipated DC Plan:  [place]  CM consult      [facility] Choice  HOME HEALTH   Choice offered to / List presented to:  C-1 Patient   DME arranged  Vassie Moselle      DME agency  [place] arranged  HH-2 PT  HH-1 RN      [facility].   Status of service:  Completed, signed off Medicare Important Message given?   (If response is "NO", the following Medicare IM given date fields will be blank) Date Medicare IM given:   Date Additional Medicare IM given:  03/06/2014  Discharge Disposition:  [place]  Per UR Regulation:  Reviewed for med. necessity/level of care/duration of stay  If discussed at [place] of Stay Meetings, dates discussed:   03/05/2014  03/07/2014    Comments:  03/07/14 Ellan Lambert, RN, BSN  [phone] Pt for dc home today; notified [facility] of dc home.  RW delivered to pt's room prior to dc.  03/06/14 Ellan Lambert, RN, BSN [phone] Met with pt and wife to discuss dc plans.  Pt requests RW for home, and wife is interested in [facility] follow up. Discussed home care options; referral to [facility], per pt/wife choice.  Start of care 24-48h post dc date.  03/05/14 Ellan Lambert, RN, BSN [phone] Pt s/p CABG x3 on 5/15; wife to provide care at dc.  Will follow for dc needs.

## 2014-02-26 NOTE — H&P (Addendum)
Physician History and Physical    [name] MRN: [id number] DOB/AGE: 22-Feb-1941 73 y.o. Admit date: 02/25/2014  Primary Care Physician: [name] Primary Cardiologist: Tamala Julian  HPI: 73 yo with history of CAD s/p CABG and multiple PCIs post-CABG, chronic diastolic CHF, and CKD presented with chest pain consistent with prior ischemic events.  Patient was admitted with CHF in 12/14.  Since that time, he seems to have done well with no chest pain.  However, this evening around 7:30, he developed lower chest pain that was severe, "like the worse indigestion you ever had."  He had this same pain with all prior ACS episodes.  He took NTG x 4 total and called EMS.  The pain had resolved by the time he got to the ER.  It lasted about 2 hours total.  He is currently pain-free.  ECG shows no acute changes.  Initial cardiac enzymes negative.    Patient has significant claudication chronically.  He does not get dyspnea walking on flat ground, but claudication does limit his exertion.  No orthopnea/PND.    Review of systems complete and found to be negative unless listed above   PMH: 1. Carotid stenosis. 2. CAD: CABG 1999 with LIMA-LAD, sequential SVG-D2 and D3, sequential SVG to PDA, AM, PLV.  Angioplasty SVG-PDA 2006.  Taxus DES to native LCx in 2007.  DES to SVG-PDA 2007, restented 2009.  NSTEMI with PCI to SVG- PDA complicated by no-reflow and inferior infarction in 2012.  3. Diabetes type II 4. Hyperlipidemia 5. PAD: L external iliac stent 2001, bilateral SFA occlusions documented 2001.  6. OSA 7. HTN 8. OSA 9. GERD: h/o esophageal stricture.  10. COPD 11. Urinary retention 12. CKD 13. CHF: Chronic diastolic with echo in 96/29 showing EF 50-55%.    Current Facility-Administered Medications  Medication Dose Route Frequency Provider Last Rate Last Dose  . heparin ADULT infusion 100 units/mL (25000 units/250 mL)  1,500 Units/hr Intravenous Continuous Rogue Bussing, [facility] 15 mL/hr at  02/26/14 0044 1,500 Units/hr at 02/26/14 0044   Current Outpatient Prescriptions  Medication Sig Dispense Refill  . acetaminophen (TYLENOL) 325 MG tablet Take 650 mg by mouth every 6 (six) hours as needed for moderate pain.      Marland Kitchen amLODipine (NORVASC) 10 MG tablet Take 10 mg by mouth daily.      Marland Kitchen aspirin EC 325 MG tablet Take 325 mg by mouth daily.      Marland Kitchen atorvastatin (LIPITOR) 20 MG tablet Take 20 mg by mouth daily.      . cholecalciferol (VITAMIN D) 1000 UNITS tablet Take 1,000 Units by mouth daily.      . clopidogrel (PLAVIX) 75 MG tablet Take 75 mg by mouth daily with breakfast.      . furosemide (LASIX) 40 MG tablet Take 1 tablet (40 mg total) by mouth daily.      Marland Kitchen glipiZIDE (GLUCOTROL XL) 5 MG 24 hr tablet Take 5 mg by mouth daily with breakfast.      . irbesartan (AVAPRO) 300 MG tablet Take 1 tablet (300 mg total) by mouth daily.  30 tablet  11  . metFORMIN (GLUCOPHAGE) 1000 MG tablet Take 1,000 mg by mouth 2 (two) times daily with a meal.      . metoprolol succinate (TOPROL-XL) 50 MG 24 hr tablet Take 1 tablet (50 mg total) by mouth daily.  90 tablet  3  . nitroGLYCERIN (NITROSTAT) 0.4 MG SL tablet Place 0.4 mg under the tongue every 5 (five)  minutes as needed for chest pain.      Marland Kitchen omeprazole (PRILOSEC) 20 MG capsule Take 20 mg by mouth daily.      . potassium chloride (KLOR-CON 10) 10 MEQ tablet Take 1 tablet (10 mEq total) by mouth daily.  30 tablet  11     Family History  Problem Relation Age of Onset  . CAD Mother   . Cirrhosis Brother     History   Social History  . Marital Status: Married    Spouse Name: N/A    Number of Children: N/A  . Years of Education: N/A   Occupational History  . Not on file.   Social History Main Topics  . Smoking status: Current Some Day Smoker -- 1.00 packs/day for 50 years    Last Attempt to Quit: 07/02/2013  . Smokeless tobacco: Never Used     Comment: QUIT ON SEPTEMBER 2014  . Alcohol Use: No  . Drug Use: No  . Sexual  Activity: Not on file   Other Topics Concern  . Not on file   Social History Narrative   ** Merged History Encounter **          (Not in a hospital admission)  Physical Exam: Blood pressure 156/62, pulse 65, temperature 97.7 F (36.5 C), temperature source Oral, resp. rate 19, height 5\' 6"  (1.676 m), weight 74.39 kg (164 lb), SpO2 96.00%.  General: NAD Neck: No JVD, no thyromegaly or thyroid nodule.  Lungs: Clear to auscultation bilaterally with normal respiratory effort. CV: Nondisplaced PMI.  Heart regular S1/S2, no S3/S4, no murmur.  No peripheral edema.  No carotid bruit.  Normal pedal pulses.  Abdomen: Soft, nontender, no hepatosplenomegaly, no distention.  Skin: Intact without lesions or rashes.  Neurologic: Alert and oriented x 3.  Psych: Normal affect. Extremities: No clubbing or cyanosis.  HEENT: Normal.   Labs:   Lab Results  Component Value Date   WBC 8.7 02/25/2014   HGB 11.4* 02/25/2014   HCT 34.9* 02/25/2014   MCV 81.4 02/25/2014   PLT 231 02/25/2014    Recent Labs Lab 02/25/14 2302  NA 135*  K 5.4*  CL 103  CO2 20  BUN 48*  CREATININE 1.68*  CALCIUM 9.1  GLUCOSE 250*  BNP 182 TnI negative x 1  Radiology: - CXR: No acute changes  EKG: NSR, RBBB, old inferior MI  ASSESSMENT AND PLAN:  73 yo with history of CAD s/p CABG and multiple PCIs post-CABG, chronic diastolic CHF, and CKD presented with chest pain consistent with prior ischemic events. 1. CAD: Concern for unstable angina.  Patient's symptoms were very similar to his prior ischemic pain.  He had about 2 hours of pain at rest. - Cycle cardiac enzymes.  - Continue ASA, beta blocker, statin, Plavix. - Will use heparin gtt.  - Will follow creatinine.  If enzymes negative and creatinine goes up, would defer cath.  If enzymes positive, will need cath.  If enzymes negative and creatinine trends down, would consider cath. Will keep NPO for tomorrow.  2. CHF: Chronic diastolic CHF, EF 84-13% on 12/14  echo.  He does not look volume overloaded.  I will hold his Lasix prior to possible cath tomorrow.  3. CKD: Creatinine around 1.5 baseline.  He may need cardiac cath.  Will hydrate gently, hold Lasix, and hold irbesartan.   4. HTN: While irbesartan is held, will use Coreg as beta blocker rather than Toprol XL as it is better for BP control.  Signed: Larey Dresser 02/26/2014, 12:15 AM

## 2014-02-26 NOTE — Progress Notes (Addendum)
ANTICOAGULATION CONSULT NOTE - Follow Up Consult  Pharmacy Consult for Heparin Indication: chest pain/ACS  No Known Allergies  Patient Measurements: Height: 5\' 6"  (167.6 cm) Weight: 167 lb 15.9 oz (76.2 kg) (patient weighed this am on admission) IBW/kg (Calculated) : 63.8 Heparin Dosing Weight: 76 kg  Vital Signs: Temp: 97.8 F (36.6 C) (05/12 1700) Temp src: Oral (05/12 1700) BP: 165/53 mmHg (05/12 1700) Pulse Rate: 67 (05/12 1700)  Labs:  Recent Labs  02/25/14 2302 02/26/14 0343 02/26/14 0834 02/26/14 1405  HGB 11.4* 11.2* 10.8*  --   HCT 34.9* 34.0* 32.9*  --   PLT 231 232 219  --   LABPROT 12.7  --  13.7  --   INR 0.97  --  1.07  --   HEPARINUNFRC  --   --  1.07*  --   CREATININE 1.68*  --  1.49*  --   TROPONINI <0.30 0.37* 0.49* 0.42*    Estimated Creatinine Clearance: 40.4 ml/min (by C-G formula based on Cr of 1.49).  Assessment:   Heparin drip resumed post cath for continued CP.  Plan TCTS consult for consideration of surgical revascularization.    Goal of Therapy:  Heparin level 0.3-0.7 units/ml Monitor platelets by anticoagulation protocol: Yes   Plan:   Continue heparin drip at 750 units/hr Will f/u sheath removal and increase drip bleeding controlled  Thanks for allowing pharmacy to be a part of this patient's care.  Excell Seltzer, PharmD Clinical Pharmacist, [phone] 02/26/2014,5:35 PM  No bleeding post TR band removal.  No CP reported per RN.  Will increase heparin to 1000 units/hr.  Check 6 hr heparin level.

## 2014-02-26 NOTE — CV Procedure (Signed)
Left Heart Catheterization with Coronary Angiography and Bypass Angiography Report  [name]  73 y.o.  male November 01, 1940  Procedure Date: 02/26/2014 Referring Physician: Lavone Orn, M.D. Primary Cardiologist:: H. WB Blenda Bridegroom, M.D.  INDICATIONS: Unstable angina pectoris  PROCEDURE: 1. Left heart catheterization; 2. Coronary angiography; 3. Left ventriculography; 4. Bypass graft angiography; 5. Left intramammary graft angiography  CONSENT:  The risks, benefits, and details of the procedure were explained in detail to the patient. Risks including death, stroke, heart attack, kidney injury, allergy, limb ischemia, bleeding and radiation injury were discussed.  The patient verbalized understanding and wanted to proceed.  Informed written consent was obtained.  PROCEDURE TECHNIQUE:  After Xylocaine anesthesia a 5 French Slender sheath was placed in the left radial artery with an angiocath and the modified Seldinger technique.  Coronary angiography was done using a 5 F JR 4 and JL 3.5 cm diagnostic catheter.  Left ventriculography was done using the JR 4 catheter and hand injection.   He developed severe chest pain in the midst of left coronary angiography. IV nitroglycerin was started and titrated. Fentanyl 75 mcg IV was given. IV heparin maintenance infusion was restarted.   CONTRAST:  Total of 120 cc.  COMPLICATIONS:  Prolonged chest pain   HEMODYNAMICS:  Aortic pressure 142/51 mmHg; LV pressure 151/6 mmHg; LVEDP 20 mm mercury  ANGIOGRAPHIC DATA:   The left main coronary artery is 90% distal involving the ostial circumflex and LAD. The mid to distal left main is heavily calcified.  The left anterior descending artery is 95% ostial with total occlusion in the mid vessel. A large first diagonal arises from the proximal LAD. The LAD is totally occluded after the third septal perforator.  The left circumflex artery is ostial 99% occluded and gives origin to a large first  obtuse marginal that is 99% occluded proximally and 3 small distal obtuse marginal branches with the fourth obtuse marginal beyond the largest of the distal branches. The mid circumflex contains segmental 90% in-stent restenosis.  The right coronary artery is totally occluded proximally.  BYPASS GRAFT ANGIOGRAPHY: The saphenous vein graft sequential to the PDA is severely and diffusely diseased with 80-90% segmental mid in-stent restenosis 50-80% ostial/proximal disease. The saphenous vein graft contains overlapping stents from the insertion into the PDA back into the proximal segment but does not include the ostium.  Saphenous vein sequential graft to the first and second diagonal is widely patent. 60-70% stenosis in the large diagonal branches noted.  LIMA to the LAD apical segment is widely patent.  LEFT VENTRICULOGRAM:  Left ventricular angiogram was done in the 30 RAO projection and revealed inferobasal severe hypokinesis with an estimated ejection fraction of 50%   IMPRESSIONS:  1. Bypass graft occlusive disease. The saphenous vein graft to the right coronary is diffusely diseased and contains high-grade in-stent restenosis in the mid graft. The LIMA to the LAD and saphenous vein graft to the diagonal is patent.  2. Severe native vessel coronary disease with total occlusion of the proximal right coronary, severe progression of left main disease to 90+ percent. Severe ostial LAD and circumflex obstruction. Severe obstruction in the second obtuse marginal. Moderate to severe obstruction in the mid circumflex.  3. Left ventricular systolic dysfunction   RECOMMENDATION:  IV nitroglycerin was started because of pain that developed during angiography. This will be titrated to control symptoms.  Resume IV heparin  TCTS consult to consider repeat surgical revascularization. A multi-vessel location of disease makes PCI  unfavorable. High risk for left main bifurcation PCI is possible and could  alleviate symptoms but would not lead to complete revascularization. Marland Kitchen

## 2014-02-26 NOTE — ED Notes (Signed)
Pt states he is pain free.

## 2014-02-27 ENCOUNTER — Other Ambulatory Visit: Payer: Self-pay | Admitting: *Deleted

## 2014-02-27 DIAGNOSIS — I2510 Atherosclerotic heart disease of native coronary artery without angina pectoris: Secondary | ICD-10-CM

## 2014-02-27 DIAGNOSIS — Z01810 Encounter for preprocedural cardiovascular examination: Secondary | ICD-10-CM

## 2014-02-27 DIAGNOSIS — N183 Chronic kidney disease, stage 3 unspecified: Secondary | ICD-10-CM

## 2014-02-27 DIAGNOSIS — I214 Non-ST elevation (NSTEMI) myocardial infarction: Principal | ICD-10-CM

## 2014-02-27 LAB — BASIC METABOLIC PANEL WITH GFR
BUN: 30 mg/dL — ABNORMAL HIGH (ref 6–23)
CO2: 21 meq/L (ref 19–32)
Calcium: 8.5 mg/dL (ref 8.4–10.5)
Chloride: 107 meq/L (ref 96–112)
Creatinine, Ser: 1.57 mg/dL — ABNORMAL HIGH (ref 0.50–1.35)
GFR calc Af Amer: 49 mL/min — ABNORMAL LOW (ref >90)
GFR calc non Af Amer: 42 mL/min — ABNORMAL LOW (ref >90)
Glucose, Bld: 192 mg/dL — ABNORMAL HIGH (ref 70–99)
Potassium: 4.9 meq/L (ref 3.7–5.3)
Sodium: 140 meq/L (ref 137–147)

## 2014-02-27 LAB — URINE CULTURE: Colony Count: >=100000

## 2014-02-27 LAB — CBC
HEMATOCRIT: 30.3 % — AB (ref 39.0–52.0)
Hemoglobin: 10 g/dL — ABNORMAL LOW (ref 13.0–17.0)
MCH: 26.9 pg (ref 26.0–34.0)
MCHC: 33 g/dL (ref 30.0–36.0)
MCV: 81.5 fL (ref 78.0–100.0)
Platelets: 230 10*3/uL (ref 150–400)
RBC: 3.72 MIL/uL — ABNORMAL LOW (ref 4.22–5.81)
RDW: 14.5 % (ref 11.5–15.5)
WBC: 8.5 10*3/uL (ref 4.0–10.5)

## 2014-02-27 LAB — GLUCOSE, CAPILLARY
GLUCOSE-CAPILLARY: 236 mg/dL — AB (ref 70–99)
Glucose-Capillary: 135 mg/dL — ABNORMAL HIGH (ref 70–99)
Glucose-Capillary: 209 mg/dL — ABNORMAL HIGH (ref 70–99)
Glucose-Capillary: 226 mg/dL — ABNORMAL HIGH (ref 70–99)
Glucose-Capillary: 178 mg/dL — ABNORMAL HIGH (ref 70–99)

## 2014-02-27 LAB — HEPARIN LEVEL (UNFRACTIONATED)
HEPARIN UNFRACTIONATED: 0.38 [IU]/mL (ref 0.30–0.70)
Heparin Unfractionated: 0.62 IU/mL (ref 0.30–0.70)

## 2014-02-27 MED ORDER — CARVEDILOL 6.25 MG PO TABS
18.7500 mg | ORAL_TABLET | Freq: Two times a day (BID) | ORAL | Status: DC
  Administered 2014-02-27 – 2014-02-28 (×3): 18.75 mg via ORAL
  Filled 2014-02-27 (×4): qty 1

## 2014-02-27 MED ORDER — MORPHINE SULFATE 2 MG/ML IJ SOLN
2.0000 mg | Freq: Once | INTRAMUSCULAR | Status: AC
  Administered 2014-02-27: 2 mg via INTRAVENOUS
  Filled 2014-02-27: qty 1

## 2014-02-27 MED ORDER — CEFTRIAXONE SODIUM 1 G IJ SOLR
1.0000 g | INTRAMUSCULAR | Status: DC
  Administered 2014-02-27 – 2014-02-28 (×2): 1 g via INTRAVENOUS
  Filled 2014-02-27 (×3): qty 10

## 2014-02-27 MED ORDER — CIPROFLOXACIN HCL 500 MG PO TABS
500.0000 mg | ORAL_TABLET | Freq: Two times a day (BID) | ORAL | Status: DC
  Administered 2014-02-27: 500 mg via ORAL
  Filled 2014-02-27 (×2): qty 1

## 2014-02-27 MED ORDER — METOPROLOL TARTRATE 1 MG/ML IV SOLN
5.0000 mg | Freq: Once | INTRAVENOUS | Status: AC
  Administered 2014-02-27: 5 mg via INTRAVENOUS
  Filled 2014-02-27: qty 5

## 2014-02-27 NOTE — Progress Notes (Addendum)
VASCULAR LAB PRELIMINARY  PRELIMINARY  PRELIMINARY  PRELIMINARY  Pre-op Cardiac Surgery  Carotid Findings:  Right:  40-59% internal carotid artery stenosis.   Left:  1-39% ICA stenosis.  Bilateral:  Vertebral artery flow is antegrade.     02/27/2014 [name], RVT   Upper Extremity Right Left  Brachial Pressures 168-Triphasic 162-Triphasic  Radial Waveforms Triphasic Triphasic  Ulnar Waveforms Triphasic Triphasic  Palmar Arch (Allen's Test) Signal obliterates with radial compression, decreases >50% with ulnar compression. Signal is unaffected with both radial and ulnar compression.    Lower  Extremity Right Left  Dorsalis Pedis 79-Monophasic 84-Monophasic  Anterior Tibial    Posterior Tibial Noncompressible-Monophasic 137-Monophasic  Ankle/Brachial Indices 0.47 0.82   The right ABI is suggestive of severe arterial insufficiency, upper end of range. The left ABI is suggestive of mild arterial insufficiency.   02/28/2014 11:05 AM [name], RVT, RDCS, RDMS   Findings:   Right Lower Extremity Vein Map    Right Great Saphenous Vein   Segment Diameter Comment  1. Origin 3.19 mm   2. High Thigh 2.88 mm   3. Mid Thigh 2.33 mm above branch 3.12 mm below branch    4. Low Thigh 3.11 mm   5. At Knee 2.33 mm Branch that rejoins main vessel at level 6  6. High Calf 3.06 mm   7. Low Calf 2.33 mm Multiple branches   8. Ankle 2.84 mm    mm    mm    mm      [name], RVT 02/27/2014, 4:51 PM

## 2014-02-27 NOTE — Consult Note (Signed)
[address]       ,[place] [address]             [phone]      Cardiothoracic Surgery Consultation  Reason for Consult: High grade left main and 3-vessel coronary disease, NSTEMI, s/p CABG 1999 Referring Physician:  Dr. Daneen Schick.  [name] is an 73 y.o. male.  HPI:   The patient has a long complex medical history including diabetes, hyperlipidemia, chronic kidney disease, bilateral carotid artery disease, ongoing heavy smoking and COPD, OSA, aortoiliac and peripheral vascular disease, as well as coronary disease, s/p CABG x 6 by me on 12/16/1997. He has a LIMA to the LAD, a sequential SVG to D2 and D3, and a sequential SVG to AM, PDA, and PL. He has continued to smoke since his original surgery and has undergone stenting of the RCA vein graft in 2007 and 2009. He is now admitted with acute onset of chest pain and ruled in for NSTEMI 2 days ago with a troponin of 0.49. Cardiac cath yesterday showed a calcified LM with 90% distal stenosis involving the ostial LCX and LAD. This compromises the large D1, septal perforators, and the entire LCX system. The LCX has an ostial 99% stenosis and there is a moderate OM1 that has 99% proximal stenosis. The remainder of the marginal branches are small. The RCA is occluded proximally. The vein graft to the right coronary system is severely and diffusely diseased and only supplies the PDA now. The vein graft to the diagonals is patent with 60-70% stenosis in one of the diagonal branches. The LIMA to the LAD is patent. LVEF is 50%. He had pain during the procedure and has continued to have pain on IV NTG and Heparin. He has been on Plavix but his P2Y12 was over 300 so either he is a non-responder or non-compliant with it.  Past Medical History  Diagnosis Date  . Carotid artery occlusion     Bilateral 50-69% stenoses of the internal carotid arteries. He needs carotid Dopplers every 6 months.  . Coronary artery disease    CABG in 1999. Stent in SVG to RCA in 1779 complicated by no reflow  . Diabetes mellitus without complication   . Hyperlipidemia   . Atherosclerosis of native arteries of the extremities with intermittent claudication Left external iliac artery stent in 2001    Bilateral SFA occlusions documented in 2001.  Marland Kitchen OSA (obstructive sleep apnea)   . Hypertension   . GERD (gastroesophageal reflux disease)   . MI (myocardial infarction)   . Coronary artery disease   . Non-STEMI (non-ST elevated myocardial infarction) 09/24/2013  . 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 1999   . CAD (coronary artery disease), native coronary artery 09/24/2013    Native circumflex Taxus stent 2007, angioplasty of the SVG to native right coronary 2006, DES stent to the SVG of RCA 2007 and resplinted 2009    . COPD (chronic obstructive pulmonary disease) 09/24/2013  . PVD (peripheral vascular disease) 09/24/2013  . Diabetes mellitus with complication 39/0/3009  . PAD (peripheral artery disease)   . History of urinary retention     has to perform self catheterizations  . 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 Esophageal stricture 09/24/2013  History of dilatation. Long-standing history of esophageal reflux     Past Surgical History  Procedure Laterality Date  . Coronary stent placement    . Coronary artery bypass graft  1999  . Cardiac catheterization      Family History  Problem Relation Age of Onset  . CAD Mother   . Cirrhosis Brother     Social History:  reports that he has been smoking.  He has never used smokeless tobacco. He reports that he does not drink alcohol or use illicit drugs.  Allergies: No Known Allergies  Medications:  I have reviewed the patient's current medications. Prior to Admission:  Prescriptions prior to admission   Medication Sig Dispense Refill  . acetaminophen (TYLENOL) 325 MG tablet Take 650 mg by mouth every 6 (six) hours as needed for moderate pain.      Marland Kitchen amLODipine (NORVASC) 10 MG tablet Take 10 mg by mouth daily.      Marland Kitchen aspirin EC 325 MG tablet Take 325 mg by mouth daily.      Marland Kitchen atorvastatin (LIPITOR) 20 MG tablet Take 20 mg by mouth daily.      . cholecalciferol (VITAMIN D) 1000 UNITS tablet Take 1,000 Units by mouth daily.      . clopidogrel (PLAVIX) 75 MG tablet Take 75 mg by mouth daily with breakfast.      . furosemide (LASIX) 40 MG tablet Take 1 tablet (40 mg total) by mouth daily.      Marland Kitchen glipiZIDE (GLUCOTROL XL) 5 MG 24 hr tablet Take 5 mg by mouth daily with breakfast.      . irbesartan (AVAPRO) 300 MG tablet Take 1 tablet (300 mg total) by mouth daily.  30 tablet  11  . metFORMIN (GLUCOPHAGE) 1000 MG tablet Take 1,000 mg by mouth 2 (two) times daily with a meal.      . metoprolol succinate (TOPROL-XL) 50 MG 24 hr tablet Take 1 tablet (50 mg total) by mouth daily.  90 tablet  3  . nitroGLYCERIN (NITROSTAT) 0.4 MG SL tablet Place 0.4 mg under the tongue every 5 (five) minutes as needed for chest pain.      Marland Kitchen omeprazole (PRILOSEC) 20 MG capsule Take 20 mg by mouth daily.      . potassium chloride (KLOR-CON 10) 10 MEQ tablet Take 1 tablet (10 mEq total) by mouth daily.  30 tablet  11   Scheduled: . amLODipine  10 mg Oral Daily  . aspirin EC  81 mg Oral Daily  . atorvastatin  20 mg Oral Daily  . carvedilol  18.75 mg Oral BID WC  . hydrALAZINE  25 mg Oral 3 times per day  . insulin aspart  0-15 Units Subcutaneous TID WC  . insulin aspart  0-5 Units Subcutaneous QHS  . metoprolol  5 mg Intravenous Once  .  morphine injection  2 mg Intravenous Once  . pantoprazole  40 mg Oral Daily   Continuous: . sodium chloride 100 mL/hr at 02/27/14 0800  . heparin 1,000 Units/hr (02/27/14 0800)  . nitroGLYCERIN 160 mcg/min (02/27/14 1600)   DIY:[id number],  oxyCODONE-acetaminophen Anti-infectives   None      Results for orders placed during the hospital encounter of 02/25/14 (from the past 48 hour(s))  CBC     Status: Abnormal   Collection Time    02/25/14 11:02 PM      Result Value Ref Range   WBC 8.7  4.0 - 10.5 K/uL   RBC 4.29  4.22 -  5.81 MIL/uL   Hemoglobin 11.4 (*) 13.0 - 17.0 g/dL   HCT 32.7 (*) 61.4 - 70.9 %   MCV 81.4  78.0 - 100.0 fL   MCH 26.6  26.0 - 34.0 pg   MCHC 32.7  30.0 - 36.0 g/dL   RDW 29.5  74.7 - 34.0 %   Platelets 231  150 - 400 K/uL  BASIC METABOLIC PANEL     Status: Abnormal   Collection Time    02/25/14 11:02 PM      Result Value Ref Range   Sodium 135 (*) 137 - 147 mEq/L   Potassium 5.4 (*) 3.7 - 5.3 mEq/L   Chloride 103  96 - 112 mEq/L   CO2 20  19 - 32 mEq/L   Glucose, Bld 250 (*) 70 - 99 mg/dL   BUN 48 (*) 6 - 23 mg/dL   Creatinine, Ser 3.70 (*) 0.50 - 1.35 mg/dL   Calcium 9.1  8.4 - 96.4 mg/dL   GFR calc non Af Amer 39 (*) >90 mL/min   GFR calc Af Amer 45 (*) >90 mL/min   Comment: (NOTE)     The eGFR has been calculated using the CKD EPI equation.     This calculation has not been validated in all clinical situations.     eGFR's persistently <90 mL/min signify possible Chronic Kidney     Disease.  PROTIME-INR     Status: None   Collection Time    02/25/14 11:02 PM      Result Value Ref Range   Prothrombin Time 12.7  11.6 - 15.2 seconds   INR 0.97  0.00 - 1.49  TROPONIN I     Status: None   Collection Time    02/25/14 11:02 PM      Result Value Ref Range   Troponin I <0.30  <0.30 ng/mL   Comment:            Due to the release kinetics of cTnI,     a negative result within the first hours     of the onset of symptoms does not rule out     myocardial infarction with certainty.     If myocardial infarction is still suspected,     repeat the test at appropriate intervals.  PRO B NATRIURETIC PEPTIDE     Status: Abnormal   Collection Time    02/25/14 11:02 PM      Result Value Ref Range    Pro B Natriuretic peptide (BNP) 182.4 (*) 0 - 125 pg/mL  I-STAT TROPOININ, ED     Status: None   Collection Time    02/25/14 11:08 PM      Result Value Ref Range   Troponin i, poc 0.01  0.00 - 0.08 ng/mL   Comment 3            Comment: Due to the release kinetics of cTnI,     a negative result within the first hours     of the onset of symptoms does not rule out     myocardial infarction with certainty.     If myocardial infarction is still suspected,     repeat the test at appropriate intervals.  URINALYSIS, ROUTINE W REFLEX MICROSCOPIC     Status: Abnormal   Collection Time    02/26/14 12:26 AM      Result Value Ref Range   Color, Urine YELLOW  YELLOW   APPearance CLOUDY (*) CLEAR   Specific Gravity, Urine  1.022  1.005 - 1.030   pH 5.0  5.0 - 8.0   Glucose, UA 100 (*) NEGATIVE mg/dL   Hgb urine dipstick SMALL (*) NEGATIVE   Bilirubin Urine NEGATIVE  NEGATIVE   Ketones, ur NEGATIVE  NEGATIVE mg/dL   Protein, ur 100 (*) NEGATIVE mg/dL   Urobilinogen, UA 0.2  0.0 - 1.0 mg/dL   Nitrite POSITIVE (*) NEGATIVE   Leukocytes, UA LARGE (*) NEGATIVE  URINE CULTURE     Status: None   Collection Time    02/26/14 12:26 AM      Result Value Ref Range   Specimen Description URINE, CLEAN CATCH     Special Requests NONE     Culture  Setup Time       Value: 02/26/2014 00:57     Performed at [facility] Count       Value: >=100,000 COLONIES/ML     Performed at [facility]   Culture       Value: ESCHERICHIA COLI     Performed at [facility]   Report Status 02/27/2014 FINAL     Organism ID, Bacteria ESCHERICHIA COLI    URINE MICROSCOPIC-ADD ON     Status: Abnormal   Collection Time    02/26/14 12:26 AM      Result Value Ref Range   Squamous Epithelial / LPF RARE  RARE   WBC, UA TOO NUMEROUS TO COUNT  <3 WBC/hpf   RBC / HPF 3-6  <3 RBC/hpf   Bacteria, UA MANY (*) RARE   Urine-Other LESS THAN 10 mL OF URINE SUBMITTED    CBC     Status: Abnormal    Collection Time    02/26/14  3:43 AM      Result Value Ref Range   WBC 7.8  4.0 - 10.5 K/uL   RBC 4.19 (*) 4.22 - 5.81 MIL/uL   Hemoglobin 11.2 (*) 13.0 - 17.0 g/dL   HCT 34.0 (*) 39.0 - 52.0 %   MCV 81.1  78.0 - 100.0 fL   MCH 26.7  26.0 - 34.0 pg   MCHC 32.9  30.0 - 36.0 g/dL   RDW 14.3  11.5 - 15.5 %   Platelets 232  150 - 400 K/uL  TROPONIN I     Status: Abnormal   Collection Time    02/26/14  3:43 AM      Result Value Ref Range   Troponin I 0.37 (*) <0.30 ng/mL   Comment:            Due to the release kinetics of cTnI,     a negative result within the first hours     of the onset of symptoms does not rule out     myocardial infarction with certainty.     If myocardial infarction is still suspected,     repeat the test at appropriate intervals.     CRITICAL RESULT CALLED TO, READ BACK BY AND VERIFIED WITH:     Olin Pia (RN) [phone] 02/26/2014 L. LOMAX  TSH     Status: None   Collection Time    02/26/14  3:43 AM      Result Value Ref Range   TSH 2.750  0.350 - 4.500 uIU/mL   Comment: Please note change in reference range.  HEPARIN LEVEL (UNFRACTIONATED)     Status: Abnormal   Collection Time    02/26/14  8:34 AM      Result Value Ref  Range   Heparin Unfractionated 1.07 (*) 0.30 - 0.70 IU/mL   Comment:            IF HEPARIN RESULTS ARE BELOW     EXPECTED VALUES, AND PATIENT     DOSAGE HAS BEEN CONFIRMED,     SUGGEST FOLLOW UP TESTING     OF ANTITHROMBIN III LEVELS.  TROPONIN I     Status: Abnormal   Collection Time    02/26/14  8:34 AM      Result Value Ref Range   Troponin I 0.49 (*) <0.30 ng/mL   Comment:            Due to the release kinetics of cTnI,     a negative result within the first hours     of the onset of symptoms does not rule out     myocardial infarction with certainty.     If myocardial infarction is still suspected,     repeat the test at appropriate intervals.     CRITICAL VALUE NOTED.  VALUE IS CONSISTENT WITH PREVIOUSLY REPORTED AND  CALLED VALUE.  PROTIME-INR     Status: None   Collection Time    02/26/14  8:34 AM      Result Value Ref Range   Prothrombin Time 13.7  11.6 - 15.2 seconds   INR 1.07  0.00 - 1.49  CBC     Status: Abnormal   Collection Time    02/26/14  8:34 AM      Result Value Ref Range   WBC 6.9  4.0 - 10.5 K/uL   RBC 4.07 (*) 4.22 - 5.81 MIL/uL   Hemoglobin 10.8 (*) 13.0 - 17.0 g/dL   HCT 32.9 (*) 39.0 - 52.0 %   MCV 80.8  78.0 - 100.0 fL   MCH 26.5  26.0 - 34.0 pg   MCHC 32.8  30.0 - 36.0 g/dL   RDW 14.3  11.5 - 15.5 %   Platelets 219  150 - 400 K/uL  BASIC METABOLIC PANEL     Status: Abnormal   Collection Time    02/26/14  8:34 AM      Result Value Ref Range   Sodium 144  137 - 147 mEq/L   Comment: DELTA CHECK NOTED   Potassium 5.0  3.7 - 5.3 mEq/L   Chloride 109  96 - 112 mEq/L   CO2 22  19 - 32 mEq/L   Glucose, Bld 93  70 - 99 mg/dL   BUN 39 (*) 6 - 23 mg/dL   Creatinine, Ser 1.49 (*) 0.50 - 1.35 mg/dL   Calcium 8.8  8.4 - 10.5 mg/dL   GFR calc non Af Amer 45 (*) >90 mL/min   GFR calc Af Amer 52 (*) >90 mL/min   Comment: (NOTE)     The eGFR has been calculated using the CKD EPI equation.     This calculation has not been validated in all clinical situations.     eGFR's persistently <90 mL/min signify possible Chronic Kidney     Disease.  HEMOGLOBIN A1C     Status: Abnormal   Collection Time    02/26/14 11:10 AM      Result Value Ref Range   Hemoglobin A1C 7.4 (*) <5.7 %   Comment: (NOTE)  According to the ADA Clinical Practice Recommendations for 2011, when     HbA1c is used as a screening test:      >=6.5%   Diagnostic of Diabetes Mellitus               (if abnormal result is confirmed)     5.7-6.4%   Increased risk of developing Diabetes Mellitus     References:Diagnosis and Classification of Diabetes Mellitus,Diabetes     ZHYQ,6578,46(NGEXB 1):S62-S69 and Standards of Medical Care in              Diabetes - 2011,Diabetes MWUX,3244,01 (Suppl 1):S11-S61.   Mean Plasma Glucose 166 (*) <117 mg/dL   Comment: Performed at [facility] P2Y12     Status: None   Collection Time    02/26/14 11:10 AM      Result Value Ref Range   Platelet Function  P2Y12 301  194 - 418 PRU   Comment:            The literature has shown a direct     correlation of PRU values over     230 with higher risks of     thrombotic events.  Lower PRU     values are associated with     platelet inhibition.  GLUCOSE, CAPILLARY     Status: None   Collection Time    02/26/14 11:28 AM      Result Value Ref Range   Glucose-Capillary 71  70 - 99 mg/dL   Comment 1 Notify RN     Comment 2 Documented in Chart    TROPONIN I     Status: Abnormal   Collection Time    02/26/14  2:05 PM      Result Value Ref Range   Troponin I 0.42 (*) <0.30 ng/mL   Comment:            Due to the release kinetics of cTnI,     a negative result within the first hours     of the onset of symptoms does not rule out     myocardial infarction with certainty.     If myocardial infarction is still suspected,     repeat the test at appropriate intervals.     CRITICAL VALUE NOTED.  VALUE IS CONSISTENT WITH PREVIOUSLY REPORTED AND CALLED VALUE.  MRSA PCR SCREENING     Status: None   Collection Time    02/26/14  4:51 PM      Result Value Ref Range   MRSA by PCR NEGATIVE  NEGATIVE   Comment:            The GeneXpert MRSA Assay (FDA     approved for NASAL specimens     only), is one component of a     comprehensive MRSA colonization     surveillance program. It is not     intended to diagnose MRSA     infection nor to guide or     monitor treatment for     MRSA infections.  GLUCOSE, CAPILLARY     Status: None   Collection Time    02/26/14  5:10 PM      Result Value Ref Range   Glucose-Capillary 96  70 - 99 mg/dL  GLUCOSE, CAPILLARY     Status: Abnormal   Collection Time    02/26/14  9:20 PM      Result  Value Ref Range   Glucose-Capillary 236 (*)  70 - 99 mg/dL  HEPARIN LEVEL (UNFRACTIONATED)     Status: None   Collection Time    02/27/14 12:00 AM      Result Value Ref Range   Heparin Unfractionated 0.62  0.30 - 0.70 IU/mL   Comment:            IF HEPARIN RESULTS ARE BELOW     EXPECTED VALUES, AND PATIENT     DOSAGE HAS BEEN CONFIRMED,     SUGGEST FOLLOW UP TESTING     OF ANTITHROMBIN III LEVELS.  HEPARIN LEVEL (UNFRACTIONATED)     Status: None   Collection Time    02/27/14  2:25 AM      Result Value Ref Range   Heparin Unfractionated 0.38  0.30 - 0.70 IU/mL   Comment:            IF HEPARIN RESULTS ARE BELOW     EXPECTED VALUES, AND PATIENT     DOSAGE HAS BEEN CONFIRMED,     SUGGEST FOLLOW UP TESTING     OF ANTITHROMBIN III LEVELS.  CBC     Status: Abnormal   Collection Time    02/27/14  2:25 AM      Result Value Ref Range   WBC 8.5  4.0 - 10.5 K/uL   RBC 3.72 (*) 4.22 - 5.81 MIL/uL   Hemoglobin 10.0 (*) 13.0 - 17.0 g/dL   HCT 30.3 (*) 39.0 - 52.0 %   MCV 81.5  78.0 - 100.0 fL   MCH 26.9  26.0 - 34.0 pg   MCHC 33.0  30.0 - 36.0 g/dL   RDW 14.5  11.5 - 15.5 %   Platelets 230  150 - 400 K/uL  BASIC METABOLIC PANEL     Status: Abnormal   Collection Time    02/27/14  2:25 AM      Result Value Ref Range   Sodium 140  137 - 147 mEq/L   Potassium 4.9  3.7 - 5.3 mEq/L   Chloride 107  96 - 112 mEq/L   CO2 21  19 - 32 mEq/L   Glucose, Bld 192 (*) 70 - 99 mg/dL   BUN 30 (*) 6 - 23 mg/dL   Creatinine, Ser 1.57 (*) 0.50 - 1.35 mg/dL   Calcium 8.5  8.4 - 10.5 mg/dL   GFR calc non Af Amer 42 (*) >90 mL/min   GFR calc Af Amer 49 (*) >90 mL/min   Comment: (NOTE)     The eGFR has been calculated using the CKD EPI equation.     This calculation has not been validated in all clinical situations.     eGFR's persistently <90 mL/min signify possible Chronic Kidney     Disease.  GLUCOSE, CAPILLARY     Status: Abnormal   Collection Time    02/27/14  8:05 AM      Result Value Ref  Range   Glucose-Capillary 135 (*) 70 - 99 mg/dL  GLUCOSE, CAPILLARY     Status: Abnormal   Collection Time    02/27/14 12:48 PM      Result Value Ref Range   Glucose-Capillary 209 (*) 70 - 99 mg/dL    Dg Chest Port 1 View  02/25/2014   CLINICAL DATA:  Chest pain  EXAM: PORTABLE CHEST - 1 VIEW  COMPARISON:  03/20/2013  FINDINGS: The cardiac shadow is within normal limits. Postsurgical changes are again seen. The lungs are clear bilaterally. No bony abnormality is noted.  IMPRESSION: No acute  abnormality seen.   Electronically Signed   By: Alcide Clever M.D.   On: 02/25/2014 23:12    Review of Systems  Constitutional: Positive for malaise/fatigue. Negative for fever, chills and weight loss.  HENT: Negative.   Eyes: Negative.   Respiratory: Negative for cough and shortness of breath.   Cardiovascular: Positive for chest pain. Negative for palpitations, orthopnea, leg swelling and PND.  Gastrointestinal: Negative.   Genitourinary: Negative.   Musculoskeletal: Negative.   Skin: Negative.   Neurological: Negative.   Endo/Heme/Allergies: Negative.   Psychiatric/Behavioral: Negative.    Blood pressure 157/38, pulse 81, temperature 98.5 F (36.9 C), temperature source Oral, resp. rate 19, height 5\' 6"  (1.676 m), weight 172 lb 6.4 oz (78.2 kg), SpO2 98.00%. Physical Exam  Constitutional: He is oriented to person, place, and time. He appears well-developed and well-nourished. No distress.  HENT:  Head: Normocephalic and atraumatic.  Mouth/Throat: Oropharynx is clear and moist.  Eyes: EOM are normal. Pupils are equal, round, and reactive to light.  Neck: Normal range of motion. Neck supple. No JVD present. No thyromegaly present.  Cardiovascular: Normal rate, regular rhythm and normal heart sounds.   No murmur heard. Respiratory: Effort normal and breath sounds normal. No respiratory distress.  Old sternotomy scar  GI: Soft. Bowel sounds are normal. He exhibits no distension and no mass.  There is no tenderness.  Musculoskeletal: Normal range of motion. He exhibits no edema and no tenderness.  Old vein harvest incision from left leg  Neurological: He is alert and oriented to person, place, and time. No cranial nerve deficit.  Skin: Skin is warm and dry.  Psychiatric: He has a normal mood and affect.    Left Heart Catheterization with Coronary Angiography and Bypass Angiography Report  Tolbert C Tunney  73 y.o.  male  1940-11-20  Procedure Date: 02/26/2014  Referring Physician: 04/28/2014, M.D.  Primary Cardiologist:: H. WB [phone], M.D.  INDICATIONS: Unstable angina pectoris  PROCEDURE: 1. Left heart catheterization; 2. Coronary angiography; 3. Left ventriculography; 4. Bypass graft angiography; 5. Left intramammary graft angiography  CONSENT:  The risks, benefits, and details of the procedure were explained in detail to the patient. Risks including death, stroke, heart attack, kidney injury, allergy, limb ischemia, bleeding and radiation injury were discussed. The patient verbalized understanding and wanted to proceed. Informed written consent was obtained.  PROCEDURE TECHNIQUE: After Xylocaine anesthesia a 5 French Slender sheath was placed in the left radial artery with an angiocath and the modified Seldinger technique. Coronary angiography was done using a 5 F JR 4 and JL 3.5 cm diagnostic catheter. Left ventriculography was done using the JR 4 catheter and hand injection.  He developed severe chest pain in the midst of left coronary angiography. IV nitroglycerin was started and titrated. Fentanyl 75 mcg IV was given. IV heparin maintenance infusion was restarted.  CONTRAST: Total of 120 cc.  COMPLICATIONS: Prolonged chest pain  HEMODYNAMICS: Aortic pressure 142/51 mmHg; LV pressure 151/6 mmHg; LVEDP 20 mm mercury  ANGIOGRAPHIC DATA: The left main coronary artery is 90% distal involving the ostial circumflex and LAD. The mid to distal left main is heavily calcified.   The left anterior descending artery is 95% ostial with total occlusion in the mid vessel. A large first diagonal arises from the proximal LAD. The LAD is totally occluded after the third septal perforator.  The left circumflex artery is ostial 99% occluded and gives origin to a large first obtuse marginal that is 99% occluded proximally  and 3 small distal obtuse marginal branches with the fourth obtuse marginal beyond the largest of the distal branches. The mid circumflex contains segmental 90% in-stent restenosis.  The right coronary artery is totally occluded proximally.  BYPASS GRAFT ANGIOGRAPHY: The saphenous vein graft sequential to the PDA is severely and diffusely diseased with 80-90% segmental mid in-stent restenosis 50-80% ostial/proximal disease. The saphenous vein graft contains overlapping stents from the insertion into the PDA back into the proximal segment but does not include the ostium.  Saphenous vein sequential graft to the first and second diagonal is widely patent. 60-70% stenosis in the large diagonal branches noted.  LIMA to the LAD apical segment is widely patent.  LEFT VENTRICULOGRAM: Left ventricular angiogram was done in the 30 RAO projection and revealed inferobasal severe hypokinesis with an estimated ejection fraction of 50%  IMPRESSIONS: 1. Bypass graft occlusive disease. The saphenous vein graft to the right coronary is diffusely diseased and contains high-grade in-stent restenosis in the mid graft. The LIMA to the LAD and saphenous vein graft to the diagonal is patent.  2. Severe native vessel coronary disease with total occlusion of the proximal right coronary, severe progression of left main disease to 90+ percent. Severe ostial LAD and circumflex obstruction. Severe obstruction in the second obtuse marginal. Moderate to severe obstruction in the mid circumflex.  3. Left ventricular systolic dysfunction  RECOMMENDATION: IV nitroglycerin was started because of pain that  developed during angiography. This will be titrated to control symptoms.  Resume IV heparin  TCTS consult to consider repeat surgical revascularization. A multi-vessel location of disease makes PCI unfavorable. High risk for left main bifurcation PCI is possible and could alleviate symptoms but would not lead to complete revascularization.  .  Assessment/Plan:  He has high grade LM and multivessel CAD with tight ostial LCx stenosis and diffuse RCA vein graft stenosis compromising the LCX system, large D1, and the PDA. I think the D1 and OM1 and PDA are of graftable size but the distal LCX is likely not graftable due to the small size, especially in a redo with the scarring that occurs and the limited mobility of the heart to expose this area well. The D1 and OM1 will be hard enough to graft due to the limited mobility and the frequent intramyocardial course of these branches. This would probably leave the distal LCX ungrafted. He is a high risk surgical patient due to redo, chronic renal disease, aortoiliac and PVD, carotid artery disease, ongoing smoking and COPD. The STS risk of mortality is 8.58% and risk of major morbidity and mortality is 39%, including a 17% risk of renal failure, 28% risk of prolonged mechanical ventilation, and 3.8% risk of stroke. I will order PFT's and vein mapping of the right leg to be sure that there is conduit. I discussed the operative procedure with the patient and family including alternatives, benefits and risks; including but not limited to bleeding, blood transfusion, infection, stroke, myocardial infarction, graft failure, heart block requiring a permanent pacemaker, organ dysfunction, and death.  Mutasim C Rahimi understands and agrees to proceed if we feel that surgery is the best alternative.  We will schedule surgery for Friday as long as there is conduit.   Gaye Pollack 02/27/2014, 4:09 PM

## 2014-02-27 NOTE — Progress Notes (Signed)
Pt complained of chest pain 10/10 at 850. Started to increase the nitro drip and got an EKG. Pain 1/10 now but stating it feels more like indigestion. Pt given his morning protonix. Will continue to monitor.

## 2014-02-27 NOTE — Progress Notes (Signed)
Pt had chest pain 10/10. Nitro increased and MD paged. Pain now 4/10. States the pain feels like squeezing and then it slowly releases. Dr. Haroldine Laws called back with new orders. Will continue to monitor.

## 2014-02-27 NOTE — Progress Notes (Signed)
eLink Physician-Brief Progress Note Patient Name: [name] DOB: 1940-12-28 MRN: [id number]  Date of Service  02/27/2014   HPI/Events of Note   Pt self-caths, has a foley currently. UA positive and cx with 100k E coli. Complicated UTI due to catheter.   eICU Interventions  Will start cipro, duration likely to be affected by need for foley. Discussed with Dr Haroldine Laws   Intervention Category Intermediate Interventions: Infection - evaluation and management  Collene Gobble 02/27/2014, 5:29 PM

## 2014-02-27 NOTE — Progress Notes (Signed)
ANTICOAGULATION CONSULT NOTE - Follow Up Consult  Pharmacy Consult for Heparin Indication: chest pain/ACS  No Known Allergies  Patient Measurements: Height: 5\' 6"  (167.6 cm) Weight: 167 lb 15.9 oz (76.2 kg) (patient weighed this am on admission) IBW/kg (Calculated) : 63.8  Vital Signs: Temp: 97.7 F (36.5 C) (05/12 2000) Temp src: Oral (05/12 2000) BP: 123/27 mmHg (05/12 2300) Pulse Rate: 57 (05/12 2300)  Labs:  Recent Labs  02/25/14 2302 02/26/14 0343 02/26/14 0834 02/26/14 1405 02/27/14  HGB 11.4* 11.2* 10.8*  --   --   HCT 34.9* 34.0* 32.9*  --   --   PLT 231 232 219  --   --   LABPROT 12.7  --  13.7  --   --   INR 0.97  --  1.07  --   --   HEPARINUNFRC  --   --  1.07*  --  0.62  CREATININE 1.68*  --  1.49*  --   --   TROPONINI <0.30 0.37* 0.49* 0.42*  --     Estimated Creatinine Clearance: 40.4 ml/min (by C-G formula based on Cr of 1.49).  Assessment: Heparin drip resumed post cath for continued CP.  Plan TCTS consult for consideration of surgical revascularization. Heparin level 0.62 (therapeutic) on 1000 units/hr. No bleeding noted.  Goal of Therapy:  Heparin level 0.3-0.7 units/ml Monitor platelets by anticoagulation protocol: Yes   Plan:  1) Continue heparin drip at 1000 units/hr 2) F/u daily heparin level and CBC  Thanks for allowing pharmacy to be a part of this patient's care.  Sherlon Handing, PharmD, BCPS Clinical pharmacist, pager [phone] 02/27/2014,12:34 AM

## 2014-02-27 NOTE — Progress Notes (Signed)
Subjective:   73 yo with history of CAD s/p CABG and multiple PCIs post-CABG, chronic diastolic CHF, CKD urinary retention with self-caths at home admitted with NSTEMI.   Cath 5/12 showed severe 3v CAD and graft disease. TCTS consulted for consideration of re-do CABG. Had eeisode of CP this am. Now resolved. On iv NTG @ 150mg /min and heparin. Seen by Dr. Cyndia Bent this am.       Intake/Output Summary (Last 24 hours) at 02/27/14 1154 Last data filed at 02/27/14 1100  Gross per 24 hour  Intake 2983.25 ml  Output   1100 ml  Net 1883.25 ml    Current meds: . amLODipine  10 mg Oral Daily  . aspirin EC  81 mg Oral Daily  . atorvastatin  20 mg Oral Daily  . carvedilol  12.5 mg Oral BID WC  . hydrALAZINE  25 mg Oral 3 times per day  . insulin aspart  0-15 Units Subcutaneous TID WC  . insulin aspart  0-5 Units Subcutaneous QHS  . pantoprazole  40 mg Oral Daily   Infusions: . sodium chloride 100 mL/hr at 02/27/14 0800  . heparin 1,000 Units/hr (02/27/14 0800)  . nitroGLYCERIN 120 mcg/min (02/27/14 1100)     Objective:  Blood pressure 160/49, pulse 86, temperature 98.1 F (36.7 C), temperature source Oral, resp. rate 23, height 5\' 6"  (1.676 m), weight 78.2 kg (172 lb 6.4 oz), SpO2 100.00%. Weight change: 3.81 kg (8 lb 6.4 oz)  Physical Exam: General:  Lying in bed No resp difficulty HEENT: normal Neck: supple. JVP flat . Carotids 2+ bilat; no bruits. No lymphadenopathy or thryomegaly appreciated. Cor: PMI nondisplaced. Regular rate & rhythm. No rubs, gallops or murmurs. Lungs: clear Abdomen: soft, nontender, nondistended. No hepatosplenomegaly. No bruits or masses. Good bowel sounds. Extremities: no cyanosis, clubbing, rash, edema Neuro: alert & orientedx3, cranial nerves grossly intact. moves all 4 extremities w/o difficulty. Affect pleasant  Telemetry: SR  Lab Results: Basic Metabolic Panel:  Recent Labs Lab 02/25/14 2302 02/26/14 0834 02/27/14 0225  NA 135* 144 140   K 5.4* 5.0 4.9  CL 103 109 107  CO2 20 22 21   GLUCOSE 053* 93 192*  BUN 48* 39* 30*  CREATININE 1.68* 1.49* 1.57*  CALCIUM 9.1 8.8 8.5   Liver Function Tests: No results found for this basename: AST, ALT, ALKPHOS, BILITOT, PROT, ALBUMIN,  in the last 168 hours No results found for this basename: LIPASE, AMYLASE,  in the last 168 hours No results found for this basename: AMMONIA,  in the last 168 hours CBC:  Recent Labs Lab 02/25/14 2302 02/26/14 0343 02/26/14 0834 02/27/14 0225  WBC 8.7 7.8 6.9 8.5  HGB 11.4* 11.2* 10.8* 10.0*  HCT 34.9* 34.0* 32.9* 30.3*  MCV 81.4 81.1 80.8 81.5  PLT 231 232 219 230   Cardiac Enzymes:  Recent Labs Lab 02/25/14 2302 02/26/14 0343 02/26/14 0834 02/26/14 1405  TROPONINI <0.30 0.37* 0.49* 0.42*   BNP: No components found with this basename: POCBNP,  CBG:  Recent Labs Lab 02/26/14 1128 02/26/14 1710 02/26/14 2120 02/27/14 0805  GLUCAP 71 96 236* 135*   Microbiology: Lab Results  Component Value Date   CULT  Value: ESCHERICHIA COLI Performed at [facility] 02/26/2014    Recent Labs Lab 02/26/14 0026  CULT ESCHERICHIA COLI Performed at [place], [facility]    Imaging: Dg Chest Port 1 View  02/25/2014   CLINICAL DATA:  Chest pain  EXAM: PORTABLE CHEST -  1 VIEW  COMPARISON:  03/20/2013  FINDINGS: The cardiac shadow is within normal limits. Postsurgical changes are again seen. The lungs are clear bilaterally. No bony abnormality is noted.  IMPRESSION: No acute abnormality seen.   Electronically Signed   By: Inez Catalina M.D.   On: 02/25/2014 23:12     ASSESSMENT:  1. NSTEMI 2. CAD s/p CABG 3.  Chronic diastolic HF 4. CKD stage III   --baseline Cr ~ 1.5 5. Urinary retention with self-caths -> Foley in place  PLAN/DISCUSSION:  Angina now controlled on high-dose NTG. Will  Increase carvedilol. Continue heparin and statin. Dr. Cyndia Bent evaluating for re-do CABG. Keep in SDU. Foley in due  to chronic urinary retention.    LOS: 2 days    Jolaine Artist, MD 02/27/2014, 11:54 AM

## 2014-02-28 ENCOUNTER — Inpatient Hospital Stay (HOSPITAL_COMMUNITY): Payer: Medicare Other

## 2014-02-28 DIAGNOSIS — I214 Non-ST elevation (NSTEMI) myocardial infarction: Secondary | ICD-10-CM

## 2014-02-28 DIAGNOSIS — I5031 Acute diastolic (congestive) heart failure: Secondary | ICD-10-CM

## 2014-02-28 DIAGNOSIS — R072 Precordial pain: Secondary | ICD-10-CM

## 2014-02-28 DIAGNOSIS — I509 Heart failure, unspecified: Secondary | ICD-10-CM

## 2014-02-28 LAB — BASIC METABOLIC PANEL
BUN: 23 mg/dL (ref 6–23)
CO2: 18 mEq/L — ABNORMAL LOW (ref 19–32)
CREATININE: 1.59 mg/dL — AB (ref 0.50–1.35)
Calcium: 8.5 mg/dL (ref 8.4–10.5)
Chloride: 103 mEq/L (ref 96–112)
GFR calc Af Amer: 48 mL/min — ABNORMAL LOW (ref >90)
GFR, EST NON AFRICAN AMERICAN: 42 mL/min — AB (ref >90)
GLUCOSE: 197 mg/dL — AB (ref 70–99)
POTASSIUM: 5.6 meq/L — AB (ref 3.7–5.3)
Sodium: 134 mEq/L — ABNORMAL LOW (ref 137–147)

## 2014-02-28 LAB — CBC
HCT: 30.4 % — ABNORMAL LOW (ref 39.0–52.0)
Hemoglobin: 10.1 g/dL — ABNORMAL LOW (ref 13.0–17.0)
MCH: 26.7 pg (ref 26.0–34.0)
MCHC: 33.2 g/dL (ref 30.0–36.0)
MCV: 80.4 fL (ref 78.0–100.0)
Platelets: 212 10*3/uL (ref 150–400)
RBC: 3.78 MIL/uL — AB (ref 4.22–5.81)
RDW: 14.4 % (ref 11.5–15.5)
WBC: 12.2 10*3/uL — AB (ref 4.0–10.5)

## 2014-02-28 LAB — GLUCOSE, CAPILLARY
GLUCOSE-CAPILLARY: 218 mg/dL — AB (ref 70–99)
Glucose-Capillary: 227 mg/dL — ABNORMAL HIGH (ref 70–99)
Glucose-Capillary: 226 mg/dL — ABNORMAL HIGH (ref 70–99)
Glucose-Capillary: 178 mg/dL — ABNORMAL HIGH (ref 70–99)

## 2014-02-28 LAB — SURGICAL PCR SCREEN
MRSA, PCR: NEGATIVE
STAPHYLOCOCCUS AUREUS: NEGATIVE

## 2014-02-28 LAB — HEPARIN LEVEL (UNFRACTIONATED): Heparin Unfractionated: 0.42 IU/mL (ref 0.30–0.70)

## 2014-02-28 LAB — ABO/RH: ABO/RH(D): O POS

## 2014-02-28 LAB — PREPARE RBC (CROSSMATCH)

## 2014-02-28 MED ORDER — MORPHINE SULFATE 2 MG/ML IJ SOLN
2.0000 mg | Freq: Once | INTRAMUSCULAR | Status: AC
  Administered 2014-02-28: 2 mg via INTRAVENOUS

## 2014-02-28 MED ORDER — SODIUM CHLORIDE 0.9 % IV SOLN
INTRAVENOUS | Status: DC
  Filled 2014-02-28: qty 40

## 2014-02-28 MED ORDER — VANCOMYCIN HCL 10 G IV SOLR
1250.0000 mg | INTRAVENOUS | Status: AC
  Administered 2014-03-01: 1250 mg via INTRAVENOUS
  Filled 2014-02-28 (×2): qty 1250

## 2014-02-28 MED ORDER — CARVEDILOL 6.25 MG PO TABS
6.2500 mg | ORAL_TABLET | Freq: Once | ORAL | Status: AC
  Administered 2014-02-28: 6.25 mg via ORAL
  Filled 2014-02-28: qty 1

## 2014-02-28 MED ORDER — METOPROLOL TARTRATE 1 MG/ML IV SOLN
5.0000 mg | Freq: Once | INTRAVENOUS | Status: AC
  Administered 2014-02-28: 5 mg via INTRAVENOUS

## 2014-02-28 MED ORDER — IPRATROPIUM BROMIDE 0.02 % IN SOLN: 0.5000 mg | Freq: Four times a day (QID) | RESPIRATORY_TRACT | Status: DC

## 2014-02-28 MED ORDER — CHLORHEXIDINE GLUCONATE CLOTH 2 % EX PADS
6.0000 | MEDICATED_PAD | Freq: Once | CUTANEOUS | Status: AC
  Administered 2014-03-01: 6 via TOPICAL

## 2014-02-28 MED ORDER — SODIUM CHLORIDE 0.9 % IV SOLN
INTRAVENOUS | Status: DC
  Filled 2014-02-28: qty 30

## 2014-02-28 MED ORDER — DIAZEPAM 5 MG PO TABS
5.0000 mg | ORAL_TABLET | Freq: Once | ORAL | Status: AC
  Administered 2014-03-01: 5 mg via ORAL
  Filled 2014-02-28: qty 1

## 2014-02-28 MED ORDER — DEXTROSE 5 % IV SOLN
750.0000 mg | INTRAVENOUS | Status: DC
  Filled 2014-02-28: qty 750

## 2014-02-28 MED ORDER — PLASMA-LYTE 148 IV SOLN
INTRAVENOUS | Status: AC
  Administered 2014-03-01: 09:00:00
  Filled 2014-02-28: qty 2.5

## 2014-02-28 MED ORDER — ALBUTEROL SULFATE (2.5 MG/3ML) 0.083% IN NEBU
2.5000 mg | INHALATION_SOLUTION | Freq: Once | RESPIRATORY_TRACT | Status: AC
  Administered 2014-02-28: 2.5 mg via RESPIRATORY_TRACT

## 2014-02-28 MED ORDER — IPRATROPIUM-ALBUTEROL 0.5-2.5 (3) MG/3ML IN SOLN
3.0000 mL | Freq: Four times a day (QID) | RESPIRATORY_TRACT | Status: DC
Start: 2014-02-28 — End: 2014-03-01
  Administered 2014-02-28 – 2014-03-01 (×3): 3 mL via RESPIRATORY_TRACT
  Filled 2014-02-28 (×2): qty 3

## 2014-02-28 MED ORDER — CARVEDILOL 25 MG PO TABS
25.0000 mg | ORAL_TABLET | Freq: Two times a day (BID) | ORAL | Status: DC
  Administered 2014-02-28: 25 mg via ORAL
  Filled 2014-02-28 (×4): qty 1

## 2014-02-28 MED ORDER — ACETAMINOPHEN 325 MG PO TABS
650.0000 mg | ORAL_TABLET | ORAL | Status: DC | PRN
  Administered 2014-02-28: 650 mg via ORAL
  Filled 2014-02-28: qty 2

## 2014-02-28 MED ORDER — SODIUM CHLORIDE 0.9 % IV SOLN
INTRAVENOUS | Status: AC
  Administered 2014-03-01: 1 [IU]/h via INTRAVENOUS
  Filled 2014-02-28: qty 1

## 2014-02-28 MED ORDER — DEXMEDETOMIDINE HCL IN NACL 400 MCG/100ML IV SOLN
0.1000 ug/kg/h | INTRAVENOUS | Status: AC
  Administered 2014-03-01: 0.3 ug/kg/h via INTRAVENOUS
  Filled 2014-02-28: qty 100

## 2014-02-28 MED ORDER — MORPHINE SULFATE 4 MG/ML IJ SOLN
3.0000 mg | INTRAMUSCULAR | Status: DC | PRN
  Administered 2014-02-28 – 2014-03-01 (×3): 3 mg via INTRAVENOUS
  Filled 2014-02-28 (×3): qty 1

## 2014-02-28 MED ORDER — METOPROLOL TARTRATE 12.5 MG HALF TABLET
12.5000 mg | ORAL_TABLET | Freq: Once | ORAL | Status: AC
  Administered 2014-03-01: 12.5 mg via ORAL
  Filled 2014-02-28 (×2): qty 1

## 2014-02-28 MED ORDER — FUROSEMIDE 10 MG/ML IJ SOLN
INTRAMUSCULAR | Status: AC
  Filled 2014-02-28: qty 4

## 2014-02-28 MED ORDER — NITROGLYCERIN IN D5W 200-5 MCG/ML-% IV SOLN
2.0000 ug/min | INTRAVENOUS | Status: DC
  Filled 2014-02-28 (×3): qty 250

## 2014-02-28 MED ORDER — BISACODYL 5 MG PO TBEC
5.0000 mg | DELAYED_RELEASE_TABLET | Freq: Once | ORAL | Status: DC
  Filled 2014-02-28: qty 1

## 2014-02-28 MED ORDER — EPINEPHRINE HCL 1 MG/ML IJ SOLN
0.5000 ug/min | INTRAVENOUS | Status: DC
  Filled 2014-02-28: qty 4

## 2014-02-28 MED ORDER — DOPAMINE-DEXTROSE 3.2-5 MG/ML-% IV SOLN
2.0000 ug/kg/min | INTRAVENOUS | Status: AC
  Administered 2014-03-01: 3 ug/kg/min via INTRAVENOUS
  Filled 2014-02-28: qty 250

## 2014-02-28 MED ORDER — FUROSEMIDE 10 MG/ML IJ SOLN: 40.0000 mg | Freq: Once | INTRAMUSCULAR | Status: DC

## 2014-02-28 MED ORDER — IPRATROPIUM BROMIDE 0.02 % IN SOLN
RESPIRATORY_TRACT | Status: AC
  Filled 2014-02-28: qty 2.5

## 2014-02-28 MED ORDER — PHENYLEPHRINE HCL 10 MG/ML IJ SOLN
30.0000 ug/min | INTRAVENOUS | Status: DC
  Filled 2014-02-28: qty 2

## 2014-02-28 MED ORDER — MORPHINE SULFATE 2 MG/ML IJ SOLN
INTRAMUSCULAR | Status: AC
  Filled 2014-02-28: qty 1

## 2014-02-28 MED ORDER — DEXTROSE 5 % IV SOLN
1.5000 g | INTRAVENOUS | Status: AC
  Administered 2014-03-01: .75 g via INTRAVENOUS
  Administered 2014-03-01: 1.5 g via INTRAVENOUS
  Filled 2014-02-28 (×2): qty 1.5

## 2014-02-28 MED ORDER — ONDANSETRON HCL 4 MG/2ML IJ SOLN
4.0000 mg | Freq: Four times a day (QID) | INTRAMUSCULAR | Status: DC | PRN
  Administered 2014-02-28: 4 mg via INTRAVENOUS

## 2014-02-28 MED ORDER — TEMAZEPAM 15 MG PO CAPS: 15.0000 mg | ORAL_CAPSULE | Freq: Once | ORAL | Status: AC | PRN

## 2014-02-28 MED ORDER — HYDROMORPHONE HCL PF 1 MG/ML IJ SOLN: 1.0000 mg | Freq: Once | INTRAMUSCULAR | Status: DC

## 2014-02-28 MED ORDER — POTASSIUM CHLORIDE 2 MEQ/ML IV SOLN
80.0000 meq | INTRAVENOUS | Status: DC
  Filled 2014-02-28: qty 40

## 2014-02-28 MED ORDER — METOPROLOL TARTRATE 1 MG/ML IV SOLN
INTRAVENOUS | Status: AC
  Filled 2014-02-28: qty 5

## 2014-02-28 MED ORDER — MAGNESIUM SULFATE 50 % IJ SOLN
40.0000 meq | INTRAMUSCULAR | Status: DC
  Filled 2014-02-28: qty 10

## 2014-02-28 MED ORDER — CHLORHEXIDINE GLUCONATE CLOTH 2 % EX PADS
6.0000 | MEDICATED_PAD | Freq: Once | CUTANEOUS | Status: AC
  Administered 2014-02-28: 6 via TOPICAL

## 2014-02-28 MED ORDER — FUROSEMIDE 10 MG/ML IJ SOLN
40.0000 mg | Freq: Three times a day (TID) | INTRAMUSCULAR | Status: DC
  Administered 2014-02-28 – 2014-03-01 (×3): 40 mg via INTRAVENOUS
  Filled 2014-02-28 (×5): qty 4

## 2014-02-28 MED ORDER — ALPRAZOLAM 0.25 MG PO TABS
0.2500 mg | ORAL_TABLET | Freq: Two times a day (BID) | ORAL | Status: DC | PRN
  Administered 2014-02-28: 0.25 mg via ORAL
  Filled 2014-02-28: qty 1

## 2014-02-28 NOTE — Progress Notes (Signed)
Still w/ low grade temp 100.5 notified Dr Aundra Dubin w/ order for blood culture and tylenol.

## 2014-02-28 NOTE — Progress Notes (Signed)
  Echocardiogram 2D Echocardiogram has been performed.  Carney Corners 02/28/2014, 4:28 PM

## 2014-02-28 NOTE — Progress Notes (Signed)
Pt complaining of CP 10/10. VSS. Hendron fellow paged. Order received for Morphine 2mg  IV x1.

## 2014-02-28 NOTE — Progress Notes (Signed)
Pt expressed wishes to be made DNR status.  Dr Colon Flattery called to bedside to fully explain and clarify pt's request.  Pt to remain full code at this time, per Dr Colon Flattery.

## 2014-02-28 NOTE — Progress Notes (Signed)
Pt's O2 sats dropping to mid 80's, HR increasing to low 100's.  Dr Colon Flattery at beside.  Orders received for non-rebreather mask, 5mg  IV metoprolol, and to give morning dose of Coreg now.  Pt complaining of CP 10/10 and nausea. Order received for another 2mg  of Morphine and 4mg  Zofran IV.  BP stable.

## 2014-02-28 NOTE — Progress Notes (Signed)
Pt still complaining of CP 10/10, even after morphine was given.  VSS. Dr Colon Flattery paged and made aware.

## 2014-02-28 NOTE — Progress Notes (Signed)
ANTICOAGULATION CONSULT NOTE - Follow Up Consult  Pharmacy Consult for Heparin Indication: chest pain/ACS  No Known Allergies  Patient Measurements: Height: 5\' 6"  (167.6 cm) Weight: 176 lb 12.9 oz (80.2 kg) IBW/kg (Calculated) : 63.8 Heparin Dosing Weight: 80kg  Vital Signs: Temp: 97.8 F (36.6 C) (05/14 0815) Temp src: Oral (05/14 0815) BP: 100/59 mmHg (05/14 0815) Pulse Rate: 74 (05/14 0815)  Labs:  Recent Labs  02/25/14 2302 02/26/14 0343  02/26/14 0834 02/26/14 1405 02/27/14 02/27/14 0225 02/28/14 0347  HGB 11.4* 11.2*  --  10.8*  --   --  10.0* 10.1*  HCT 34.9* 34.0*  --  32.9*  --   --  30.3* 30.4*  PLT 231 232  --  219  --   --  230 212  LABPROT 12.7  --   --  13.7  --   --   --   --   INR 0.97  --   --  1.07  --   --   --   --   HEPARINUNFRC  --   --   < > 1.07*  --  0.62 0.38 0.42  CREATININE 1.68*  --   --  1.49*  --   --  1.57*  --   TROPONINI <0.30 0.37*  --  0.49* 0.42*  --   --   --   < > = values in this interval not displayed.  Estimated Creatinine Clearance: 42.3 ml/min (by C-G formula based on Cr of 1.57).   Medications:  Heparin @ 1000 units/hr  Assessment: 72yom resumed on heparin after cath yesterday for continued chest pain. CVTS consulted and plan is for OHS tomorrow. Heparin level is therapeutic. CBC is stable. No bleeding reported.  Goal of Therapy:  Heparin level 0.3-0.7 units/ml Monitor platelets by anticoagulation protocol: Yes   Plan:  1) Continue heparin at 1000 units/hr 2) Follow up after OHS 5/15  Benjamine Sprague [facility] 02/28/2014,9:05 AM

## 2014-02-28 NOTE — Progress Notes (Signed)
2 Days Post-Op Procedure(s): LEFT HEART CATHETERIZATION WITH CORONARY/GRAFT ANGIOGRAM Subjective:  Had a rough night with chest pain 10/10 that was recurrent on heparin and NTG. Increased oxygen requirement today.  Objective: Vital signs in last 24 hours: Temp:  [97.8 F (36.6 C)-98.6 F (37 C)] 98.4 F (36.9 C) (05/14 1200) Pulse Rate:  [73-102] 74 (05/14 0815) Cardiac Rhythm:  [-] Normal sinus rhythm (05/14 0400) Resp:  [14-28] 15 (05/14 1200) BP: (100-163)/(38-68) 162/68 mmHg (05/14 1200) SpO2:  [86 %-100 %] 90 % (05/14 1200) FiO2 (%):  [50 %-100 %] 100 % (05/14 0400) Weight:  [80.2 kg (176 lb 12.9 oz)] 80.2 kg (176 lb 12.9 oz) (05/14 0525)  Hemodynamic parameters for last 24 hours:    Intake/Output from previous day: 05/13 0701 - 05/14 0700 In: 3257 [I.V.:3207; IV Piggyback:50] Out: 900 [Urine:900] Intake/Output this shift: Total I/O In: 300 [I.V.:300] Out: 900 [Urine:900]  General appearance: alert, cooperative and mild distress Neurologic: intact Heart: regular rate and rhythm, S1, S2 normal, no murmur, click, rub or gallop Lungs: clear to auscultation bilaterally Abdomen: distended. Tight, bowel sounds present. nontender. Extremities: edema mild  Lab Results:  Recent Labs  02/27/14 0225 02/28/14 0347  WBC 8.5 12.2*  HGB 10.0* 10.1*  HCT 30.3* 30.4*  PLT 230 212   BMET:  Recent Labs  02/27/14 0225 02/28/14 1045  NA 140 134*  K 4.9 5.6*  CL 107 103  CO2 21 18*  GLUCOSE 192* 197*  BUN 30* 23  CREATININE 1.57* 1.59*  CALCIUM 8.5 8.5    PT/INR:  Recent Labs  02/26/14 0834  LABPROT 13.7  INR 1.07   ABG    Component Value Date/Time   TCO2 26 09/24/2013 0425   CBG (last 3)   Recent Labs  02/27/14 2112 02/28/14 0819 02/28/14 1213  GLUCAP 178* 227* 226*    Assessment/Plan: S/P Procedure(s): LEFT HEART CATHETERIZATION WITH CORONARY/GRAFT ANGIOGRAM Severe left main and 3 vessel coronary disease with unstable angina and NSTEMI.  His  carotid dopplers show bilateral disease that is not hemodynamically significant. PFT's looks terrible with FEV1 0.9 but I suspect that is partly his underlying COPD and partly his current CHF, distended abdomen and distress. He was breathing well prior to admission according to him and wife. Vein mapping shows an adequate sized right GSV.  Will check CXR and KUB. He has not had these done today despite having to go on a non-rebreather and having a very distended abdomen.  I have scheduled for redo CABG tomorrow am. He is at high risk for complications and death. I discussed the operative procedure with the patient and his wife including alternatives, benefits and risks; including but not limited to bleeding, blood transfusion, infection, stroke, myocardial infarction, graft failure, heart block requiring a permanent pacemaker, organ dysfunction, and death.  [name] understands and agrees to proceed.   LOS: 3 days    [name] 02/28/2014

## 2014-02-28 NOTE — Progress Notes (Signed)
Subjective:   73 yo with history of CAD s/p CABG and multiple PCIs post-CABG, chronic diastolic CHF, CKD urinary retention with self-caths at home admitted with NSTEMI.   Cath 5/12 showed severe 3v CAD and graft disease. TCTS consulted for consideration of re-do CABG. Had eeisode of CP this am. Now resolved. On iv NTG @ 150mg /min and heparin. Has been evaluated by Dr Cyndia Bent and revasculization surgery is tentatively scheduled for tomorrow.   Overnight, he has increased chest pain and oxygen demand requiring a NR which recovered his O2 sats. He also received 6 mg of IV morphine. He feels well currently and breathing better. Chest pain has improved. No other complaints. Had orders to transfer to ICU but we can hold for now since he appears stable at this time.   Intake/Output Summary (Last 24 hours) at 02/28/14 0937 Last data filed at 02/28/14 0700  Gross per 24 hour  Intake   3025 ml  Output    900 ml  Net   2125 ml    Current meds: . amLODipine  10 mg Oral Daily  . aspirin EC  81 mg Oral Daily  . atorvastatin  20 mg Oral Daily  . carvedilol  18.75 mg Oral BID WC  . cefTRIAXone (ROCEPHIN)  IV  1 g Intravenous Q24H  . hydrALAZINE  25 mg Oral 3 times per day  .  HYDROmorphone (DILAUDID) injection  1 mg Intravenous Once  . insulin aspart  0-15 Units Subcutaneous TID WC  . insulin aspart  0-5 Units Subcutaneous QHS  . pantoprazole  40 mg Oral Daily   Infusions: . sodium chloride 100 mL/hr at 02/28/14 0700  . heparin 1,000 Units/hr (02/28/14 0700)  . nitroGLYCERIN 100 mcg/min (02/28/14 0700)     Objective:  Blood pressure 100/59, pulse 74, temperature 97.8 F (36.6 C), temperature source Oral, resp. rate 19, height 5\' 6"  (1.676 m), weight 176 lb 12.9 oz (80.2 kg), SpO2 94.00%. Weight change: 4 lb 6.5 oz (2 kg)  Physical Exam: General:  Lying in bed No resp difficulty, on nonrebreather at 15L. Family at bedside.  HEENT: normal Neck: supple. JVP 12 cm. Carotids 2+ bilat; no  bruits. No lymphadenopathy or thryomegaly appreciated. Cor: PMI nondisplaced. Regular rate & rhythm. No rubs, gallops or murmurs. Lungs: Expiratory wheezing Abdomen: soft, nontender, moderately distended. No hepatosplenomegaly. No bruits or masses. Good bowel sounds. Extremities: no cyanosis, clubbing, rash, edema Neuro: alert & orientedx3, cranial nerves grossly intact. moves all 4 extremities w/o difficulty. Affect pleasant  Telemetry: SR  Lab Results: Basic Metabolic Panel:  Recent Labs Lab 02/25/14 2302 02/26/14 0834 02/27/14 0225  NA 135* 144 140  K 5.4* 5.0 4.9  CL 103 109 107  CO2 20 22 21   GLUCOSE 250* 93 192*  BUN 48* 39* 30*  CREATININE 1.68* 1.49* 1.57*  CALCIUM 9.1 8.8 8.5   CBC:  Recent Labs Lab 02/25/14 2302 02/26/14 0343 02/26/14 0834 02/27/14 0225 02/28/14 0347  WBC 8.7 7.8 6.9 8.5 12.2*  HGB 11.4* 11.2* 10.8* 10.0* 10.1*  HCT 34.9* 34.0* 32.9* 30.3* 30.4*  MCV 81.4 81.1 80.8 81.5 80.4  PLT 231 232 219 230 212   Cardiac Enzymes:  Recent Labs Lab 02/25/14 2302 02/26/14 0343 02/26/14 0834 02/26/14 1405  TROPONINI <0.30 0.37* 0.49* 0.42*   CBG:  Recent Labs Lab 02/27/14 0805 02/27/14 1248 02/27/14 1707 02/27/14 2112 02/28/14 0819  GLUCAP 135* 209* 226* 178* 227*   Microbiology: Lab Results  Component Value Date   CULT  Value: ESCHERICHIA COLI Performed at [facility] 02/26/2014    Recent Labs Lab 02/26/14 0026  CULT ESCHERICHIA COLI Performed at [place]    Imaging: No results found.   ASSESSMENT:  1. NSTEMI: in the setting of CAD s/p CABG. EKG with persistent evidence of inferior wall STEMI. [employer] Cath 5/12> revealed Bypass graft occlusive disease. The saphenous vein graft to the right coronary is diffusely diseased and contains high-grade in-stent restenosis in the mid graft. The LIMA to the LAD and saphenous vein graft to the diagonal is patent. Severe native vessel coronary  disease with total occlusion of the proximal right coronary, severe progression of left main disease to 90+ percent. Severe ostial LAD and circumflex obstruction. Severe obstruction in the second obtuse marginal. Moderate to severe obstruction in the mid circumflex. Left ventricular systolic dysfunction with estimated EF 50%.  Plan  - TCTS consult to consider repeat surgical revascularization scheduled for tomorrow - NTG gtt  - PFT's and vein mapping of the right leg to be sure that there is conduit - per TCTS - prn morphine for increased chest pain  - cont with heparin and statin  - keep in SDU if cont to be stable  - cont with coreg.  2.  Chronic diastolic HF: last echo 11/5425 with EF 50-55% with grade 2 diastolic dysfunction.  3. CKD stage III at baseline Cr.    --baseline Cr ~ 1.5 4. Urinary retention with self-caths -> Foley in place due to chronic retention. On cefriaxone for uti 5. Hypertension: stable. Cont with Amlodipine 10mg  daily, Coreg 18.75mg  bid, Hydralazine 25mg  q8hr. Consider addition/replacing with Acei  Case discussed with Dr Aundra Dubin.     LOS: 3 days    Signed:  Jessee Avers, MD PGY-2 Internal Medicine Teaching Service Pager: [phone] 02/28/2014, 9:56 AM   Patient seen with resident, agree with the above note.  1. CAD: s/p CABG, now with NSTEMI and severe disease of both native vessels and grafts.  If vein conduits adequate, plan for high-risk CABG on Friday. He has ongoing chest pain suggestive of angina.  It is possible that volume overload may be potentiating this.  - Diurese. - Continue ASA, statin, heparin gtt, NTG gtt.  Can increase Coreg to 25 mg bid and continue amlodipine for angina.  2. Acute on chronic diastolic CHF: Patient is clearly volume overloaded on exam with wheezes and abdominal distention.  Volume overload may be making chest pain worse.  EF 50% on LV-gram.  - Lasix 40 mg IV every 8 hrs, give dose now.  Stop IV fluid.  - Needs echo.  3.  COPD: Possible COPD component.  Wheezes, however, may be cardiogenic with pulmonary edema.  Will start Atrovent nebs.  4. CKD: Needs BMET today, follow creatinine closely.   Larey Dresser 02/28/2014 10:38 AM

## 2014-02-28 NOTE — Progress Notes (Signed)
[id number] Cardiac Rehab Completed pre-op surgery education with pt's wife ans pt. Pt was sleepy and fell asleep with most of the education. I gave wife pre-op surgery booklet and pt care guide. Pt's wife voices understanding. I made sur to tell pt when he was awake about use of IS, sternal precautions and walking post-op. Pt's wife states that she will be able to provide care for him 24/7 at discharge. We will follow pt post-op as ordered. Deon Pilling, RN 02/28/2014 3:22 PM

## 2014-02-28 NOTE — Plan of Care (Signed)
On call Note: Cc: chest pain  73 yo with history of CAD s/p CABG and multiple PCIs post-CABG, chronic diastolic CHF, CKD urinary retention with self-caths at home admitted with NSTEMI. Cath 5/12 showed severe 3v CAD and graft disease. TCTS consulted for consideration of re-do CABG.  On iv NTG @ 150mg /min and heparin. Seen by Dr. Cyndia Bent this am.   I was called due to patient having 10/10 chest pain while maxed out on nitro gtt and after getting IV morphine.  Patient continued to have pain unrelieved by second dose of morphine.  He did appear anxious and had low 02 sats in upper 80s along with HRs in 100s.  BP was stable with SBP 120-130s.  EKG had no acute ST segment changes.  I discussed the case with Dr. Tamala Julian.  Discussed goals of care with patient and family at bedside given high risk disease and plans for possible surgery.  Plan: -Will transfer to ICU from stepdown -close monitoring for any arrhythmias or ischemic changes -Continue IV heparin and IV Nitro.  Will wean down Nitro to allow BP more for prn meds -IV 5mg  metop followed by coreg  -IV Morphine prn -NRB for supplemental oxygen -Code status: Full code per patient's wishes  Addendum: Patient much improved after interventions.  HR in 70-80s, O2 100%. Continue close monitoring.

## 2014-02-28 NOTE — Progress Notes (Signed)
Dr. Cyndia Bent notified of temp 100.7, no new orders received.

## 2014-03-01 ENCOUNTER — Encounter (HOSPITAL_COMMUNITY)
Admission: EM | Disposition: A | Discharge status: Home Health | Payer: Medicare Other | Source: Home / Self Care | Attending: Surgery

## 2014-03-01 ENCOUNTER — Encounter (HOSPITAL_COMMUNITY): Payer: Self-pay | Admitting: Certified Registered"

## 2014-03-01 ENCOUNTER — Inpatient Hospital Stay (HOSPITAL_COMMUNITY): Payer: Medicare Other | Admitting: Anesthesiology

## 2014-03-01 ENCOUNTER — Encounter (HOSPITAL_COMMUNITY): Payer: Medicare Other | Admitting: Anesthesiology

## 2014-03-01 ENCOUNTER — Inpatient Hospital Stay (HOSPITAL_COMMUNITY): Payer: Medicare Other

## 2014-03-01 HISTORY — PX: INTRAOPERATIVE TRANSESOPHAGEAL ECHOCARDIOGRAM: SHX5062

## 2014-03-01 HISTORY — PX: CORONARY ARTERY BYPASS GRAFT: SHX141

## 2014-03-01 LAB — POCT I-STAT 3, ART BLOOD GAS (G3+)
ACID-BASE DEFICIT: 1 mmol/L (ref 0.0–2.0)
ACID-BASE DEFICIT: 2 mmol/L (ref 0.0–2.0)
ACID-BASE DEFICIT: 4 mmol/L — AB (ref 0.0–2.0)
ACID-BASE DEFICIT: 4 mmol/L — AB (ref 0.0–2.0)
ACID-BASE DEFICIT: 4 mmol/L — AB (ref 0.0–2.0)
Acid-base deficit: 4 mmol/L — ABNORMAL HIGH (ref 0.0–2.0)
Acid-base deficit: 4 mmol/L — ABNORMAL HIGH (ref 0.0–2.0)
Acid-base deficit: 5 mmol/L — ABNORMAL HIGH (ref 0.0–2.0)
BICARBONATE: 20.9 meq/L (ref 20.0–24.0)
BICARBONATE: 22.3 meq/L (ref 20.0–24.0)
BICARBONATE: 22.4 meq/L (ref 20.0–24.0)
BICARBONATE: 22.8 meq/L (ref 20.0–24.0)
BICARBONATE: 23.1 meq/L (ref 20.0–24.0)
Bicarbonate: 21.5 mEq/L (ref 20.0–24.0)
Bicarbonate: 21.6 mEq/L (ref 20.0–24.0)
Bicarbonate: 22.2 mEq/L (ref 20.0–24.0)
Bicarbonate: 24.7 mEq/L — ABNORMAL HIGH (ref 20.0–24.0)
O2 SAT: 88 %
O2 SAT: 93 %
O2 Saturation: 100 %
O2 Saturation: 72 %
O2 Saturation: 78 %
O2 Saturation: 85 %
O2 Saturation: 87 %
O2 Saturation: 89 %
O2 Saturation: 99 %
PCO2 ART: 46.7 mmHg — AB (ref 35.0–45.0)
PCO2 ART: 39.8 mmHg (ref 35.0–45.0)
PCO2 ART: 42.1 mmHg (ref 35.0–45.0)
PCO2 ART: 43.7 mmHg (ref 35.0–45.0)
PH ART: 7.295 — AB (ref 7.350–7.450)
PH ART: 7.401 (ref 7.350–7.450)
PH ART: 7.328 — AB (ref 7.350–7.450)
PH ART: 7.299 — AB (ref 7.350–7.450)
PH ART: 7.421 (ref 7.350–7.450)
PO2 ART: 70 mmHg — AB (ref 80.0–100.0)
Patient temperature: 36.3
Patient temperature: 37
TCO2: 22 mmol/L (ref 0–100)
TCO2: 23 mmol/L (ref 0–100)
TCO2: 23 mmol/L (ref 0–100)
TCO2: 23 mmol/L (ref 0–100)
TCO2: 24 mmol/L (ref 0–100)
TCO2: 24 mmol/L (ref 0–100)
TCO2: 24 mmol/L (ref 0–100)
TCO2: 24 mmol/L (ref 0–100)
TCO2: 26 mmol/L (ref 0–100)
pCO2 arterial: 35.5 mmHg (ref 35.0–45.0)
pCO2 arterial: 37.6 mmHg (ref 35.0–45.0)
pCO2 arterial: 38.4 mmHg (ref 35.0–45.0)
pCO2 arterial: 40.2 mmHg (ref 35.0–45.0)
pCO2 arterial: 43.7 mmHg (ref 35.0–45.0)
pH, Arterial: 7.318 — ABNORMAL LOW (ref 7.350–7.450)
pH, Arterial: 7.338 — ABNORMAL LOW (ref 7.350–7.450)
pH, Arterial: 7.343 — ABNORMAL LOW (ref 7.350–7.450)
pH, Arterial: 7.382 (ref 7.350–7.450)
pO2, Arterial: 155 mmHg — ABNORMAL HIGH (ref 80.0–100.0)
pO2, Arterial: 342 mmHg — ABNORMAL HIGH (ref 80.0–100.0)
pO2, Arterial: 41 mmHg — ABNORMAL LOW (ref 80.0–100.0)
pO2, Arterial: 44 mmHg — ABNORMAL LOW (ref 80.0–100.0)
pO2, Arterial: 54 mmHg — ABNORMAL LOW (ref 80.0–100.0)
pO2, Arterial: 55 mmHg — ABNORMAL LOW (ref 80.0–100.0)
pO2, Arterial: 57 mmHg — ABNORMAL LOW (ref 80.0–100.0)
pO2, Arterial: 59 mmHg — ABNORMAL LOW (ref 80.0–100.0)

## 2014-03-01 LAB — CBC
HCT: 28.5 % — ABNORMAL LOW (ref 39.0–52.0)
HCT: 32.2 % — ABNORMAL LOW (ref 39.0–52.0)
HEMATOCRIT: 31.9 % — AB (ref 39.0–52.0)
HEMOGLOBIN: 10.8 g/dL — AB (ref 13.0–17.0)
Hemoglobin: 9.5 g/dL — ABNORMAL LOW (ref 13.0–17.0)
Hemoglobin: 11.2 g/dL — ABNORMAL LOW (ref 13.0–17.0)
MCH: 27.4 pg (ref 26.0–34.0)
MCH: 27.3 pg (ref 26.0–34.0)
MCH: 27.4 pg (ref 26.0–34.0)
MCHC: 33.3 g/dL (ref 30.0–36.0)
MCHC: 33.9 g/dL (ref 30.0–36.0)
MCHC: 34.8 g/dL (ref 30.0–36.0)
MCV: 82.1 fL (ref 78.0–100.0)
MCV: 80.8 fL (ref 78.0–100.0)
MCV: 78.7 fL (ref 78.0–100.0)
PLATELETS: 179 10*3/uL (ref 150–400)
Platelets: 126 10*3/uL — ABNORMAL LOW (ref 150–400)
Platelets: 149 10*3/uL — ABNORMAL LOW (ref 150–400)
RBC: 3.47 MIL/uL — ABNORMAL LOW (ref 4.22–5.81)
RBC: 3.95 MIL/uL — AB (ref 4.22–5.81)
RBC: 4.09 MIL/uL — ABNORMAL LOW (ref 4.22–5.81)
RDW: 14.5 % (ref 11.5–15.5)
RDW: 14.4 % (ref 11.5–15.5)
RDW: 14.5 % (ref 11.5–15.5)
WBC: 9.9 10*3/uL (ref 4.0–10.5)
WBC: 19.2 10*3/uL — AB (ref 4.0–10.5)
WBC: 18 10*3/uL — ABNORMAL HIGH (ref 4.0–10.5)

## 2014-03-01 LAB — GLUCOSE, CAPILLARY
GLUCOSE-CAPILLARY: 135 mg/dL — AB (ref 70–99)
Glucose-Capillary: 214 mg/dL — ABNORMAL HIGH (ref 70–99)
Glucose-Capillary: 123 mg/dL — ABNORMAL HIGH (ref 70–99)
Glucose-Capillary: 104 mg/dL — ABNORMAL HIGH (ref 70–99)
Glucose-Capillary: 103 mg/dL — ABNORMAL HIGH (ref 70–99)
Glucose-Capillary: 94 mg/dL (ref 70–99)
Glucose-Capillary: 88 mg/dL (ref 70–99)
Glucose-Capillary: 124 mg/dL — ABNORMAL HIGH (ref 70–99)
Glucose-Capillary: 105 mg/dL — ABNORMAL HIGH (ref 70–99)

## 2014-03-01 LAB — POCT I-STAT 4, (NA,K, GLUC, HGB,HCT)
GLUCOSE: 174 mg/dL — AB (ref 70–99)
GLUCOSE: 164 mg/dL — AB (ref 70–99)
Glucose, Bld: 193 mg/dL — ABNORMAL HIGH (ref 70–99)
Glucose, Bld: 150 mg/dL — ABNORMAL HIGH (ref 70–99)
Glucose, Bld: 159 mg/dL — ABNORMAL HIGH (ref 70–99)
Glucose, Bld: 180 mg/dL — ABNORMAL HIGH (ref 70–99)
Glucose, Bld: 158 mg/dL — ABNORMAL HIGH (ref 70–99)
HCT: 27 % — ABNORMAL LOW (ref 39.0–52.0)
HCT: 23 % — ABNORMAL LOW (ref 39.0–52.0)
HCT: 23 % — ABNORMAL LOW (ref 39.0–52.0)
HCT: 25 % — ABNORMAL LOW (ref 39.0–52.0)
HCT: 32 % — ABNORMAL LOW (ref 39.0–52.0)
HEMATOCRIT: 26 % — AB (ref 39.0–52.0)
HEMATOCRIT: 25 % — AB (ref 39.0–52.0)
HEMOGLOBIN: 9.2 g/dL — AB (ref 13.0–17.0)
HEMOGLOBIN: 8.5 g/dL — AB (ref 13.0–17.0)
Hemoglobin: 7.8 g/dL — ABNORMAL LOW (ref 13.0–17.0)
Hemoglobin: 8.8 g/dL — ABNORMAL LOW (ref 13.0–17.0)
Hemoglobin: 7.8 g/dL — ABNORMAL LOW (ref 13.0–17.0)
Hemoglobin: 8.5 g/dL — ABNORMAL LOW (ref 13.0–17.0)
Hemoglobin: 10.9 g/dL — ABNORMAL LOW (ref 13.0–17.0)
POTASSIUM: 4.6 meq/L (ref 3.7–5.3)
POTASSIUM: 5.8 meq/L — AB (ref 3.7–5.3)
POTASSIUM: 4.7 meq/L (ref 3.7–5.3)
Potassium: 4.8 mEq/L (ref 3.7–5.3)
Potassium: 4.9 mEq/L (ref 3.7–5.3)
Potassium: 5.2 mEq/L (ref 3.7–5.3)
Potassium: 6.7 mEq/L (ref 3.7–5.3)
SODIUM: 133 meq/L — AB (ref 137–147)
SODIUM: 133 meq/L — AB (ref 137–147)
SODIUM: 134 meq/L — AB (ref 137–147)
Sodium: 134 mEq/L — ABNORMAL LOW (ref 137–147)
Sodium: 136 mEq/L — ABNORMAL LOW (ref 137–147)
Sodium: 137 mEq/L (ref 137–147)
Sodium: 130 mEq/L — ABNORMAL LOW (ref 137–147)

## 2014-03-01 LAB — PREPARE RBC (CROSSMATCH)

## 2014-03-01 LAB — BLOOD GAS, ARTERIAL
Acid-base deficit: 3.3 mmol/L — ABNORMAL HIGH (ref 0.0–2.0)
Bicarbonate: 21.7 mEq/L (ref 20.0–24.0)
Drawn by: 234041
FIO2: 21 %
O2 Saturation: 80.1 %
PATIENT TEMPERATURE: 98.6
TCO2: 23 mmol/L (ref 0–100)
pCO2 arterial: 42.2 mmHg (ref 35.0–45.0)
pH, Arterial: 7.331 — ABNORMAL LOW (ref 7.350–7.450)
pO2, Arterial: 45.3 mmHg — ABNORMAL LOW (ref 80.0–100.0)

## 2014-03-01 LAB — POCT I-STAT, CHEM 8
BUN: 20 mg/dL (ref 6–23)
CALCIUM ION: 1.14 mmol/L (ref 1.13–1.30)
CREATININE: 1.8 mg/dL — AB (ref 0.50–1.35)
Chloride: 104 mEq/L (ref 96–112)
GLUCOSE: 102 mg/dL — AB (ref 70–99)
HEMATOCRIT: 31 % — AB (ref 39.0–52.0)
Hemoglobin: 10.5 g/dL — ABNORMAL LOW (ref 13.0–17.0)
Potassium: 5 mEq/L (ref 3.7–5.3)
Sodium: 136 mEq/L — ABNORMAL LOW (ref 137–147)
TCO2: 22 mmol/L (ref 0–100)

## 2014-03-01 LAB — BASIC METABOLIC PANEL
BUN: 24 mg/dL — ABNORMAL HIGH (ref 6–23)
CHLORIDE: 100 meq/L (ref 96–112)
CO2: 21 mEq/L (ref 19–32)
CREATININE: 2.06 mg/dL — AB (ref 0.50–1.35)
Calcium: 8.6 mg/dL (ref 8.4–10.5)
GFR calc Af Amer: 35 mL/min — ABNORMAL LOW (ref >90)
GFR calc non Af Amer: 31 mL/min — ABNORMAL LOW (ref >90)
Glucose, Bld: 191 mg/dL — ABNORMAL HIGH (ref 70–99)
POTASSIUM: 5 meq/L (ref 3.7–5.3)
Sodium: 132 mEq/L — ABNORMAL LOW (ref 137–147)

## 2014-03-01 LAB — POCT I-STAT 3, VENOUS BLOOD GAS (G3P V)
Acid-base deficit: 3 mmol/L — ABNORMAL HIGH (ref 0.0–2.0)
Bicarbonate: 24.1 mEq/L — ABNORMAL HIGH (ref 20.0–24.0)
O2 SAT: 64 %
PH VEN: 7.27 (ref 7.250–7.300)
TCO2: 26 mmol/L (ref 0–100)
pCO2, Ven: 52.5 mmHg — ABNORMAL HIGH (ref 45.0–50.0)
pO2, Ven: 38 mmHg (ref 30.0–45.0)

## 2014-03-01 LAB — PLATELET COUNT: Platelets: 103 10*3/uL — ABNORMAL LOW (ref 150–400)

## 2014-03-01 LAB — CREATININE, SERUM
CREATININE: 1.72 mg/dL — AB (ref 0.50–1.35)
GFR calc non Af Amer: 38 mL/min — ABNORMAL LOW (ref >90)
GFR, EST AFRICAN AMERICAN: 44 mL/min — AB (ref >90)

## 2014-03-01 LAB — PROTIME-INR
INR: 1.47 (ref 0.00–1.49)
Prothrombin Time: 17.4 seconds — ABNORMAL HIGH (ref 11.6–15.2)

## 2014-03-01 LAB — HEMOGLOBIN AND HEMATOCRIT, BLOOD
HCT: 21.6 % — ABNORMAL LOW (ref 39.0–52.0)
HEMOGLOBIN: 7.3 g/dL — AB (ref 13.0–17.0)

## 2014-03-01 LAB — HEPARIN LEVEL (UNFRACTIONATED): HEPARIN UNFRACTIONATED: 0.26 [IU]/mL — AB (ref 0.30–0.70)

## 2014-03-01 LAB — MAGNESIUM: MAGNESIUM: 2.8 mg/dL — AB (ref 1.5–2.5)

## 2014-03-01 LAB — APTT: APTT: 33 s (ref 24–37)

## 2014-03-01 SURGERY — REDO CORONARY ARTERY BYPASS GRAFTING (CABG)
Anesthesia: General | Site: Chest

## 2014-03-01 MED ORDER — INSULIN REGULAR BOLUS VIA INFUSION
0.0000 [IU] | Freq: Three times a day (TID) | INTRAVENOUS | Status: DC
  Filled 2014-03-01: qty 10

## 2014-03-01 MED ORDER — ROCURONIUM BROMIDE 100 MG/10ML IV SOLN
INTRAVENOUS | Status: DC | PRN
  Administered 2014-03-01: 20 mg via INTRAVENOUS
  Administered 2014-03-01: 50 mg via INTRAVENOUS
  Administered 2014-03-01: 30 mg via INTRAVENOUS
  Administered 2014-03-01 (×2): 50 mg via INTRAVENOUS

## 2014-03-01 MED ORDER — THROMBIN 20000 UNITS EX KIT
PACK | CUTANEOUS | Status: DC | PRN
  Administered 2014-03-01: 09:00:00 via TOPICAL

## 2014-03-01 MED ORDER — METOPROLOL TARTRATE 12.5 MG HALF TABLET
12.5000 mg | ORAL_TABLET | Freq: Two times a day (BID) | ORAL | Status: DC
  Administered 2014-03-02 – 2014-03-03 (×3): 12.5 mg via ORAL
  Filled 2014-03-01 (×7): qty 1

## 2014-03-01 MED ORDER — ROCURONIUM BROMIDE 50 MG/5ML IV SOLN
INTRAVENOUS | Status: AC
  Filled 2014-03-01: qty 2

## 2014-03-01 MED ORDER — MIDAZOLAM HCL 10 MG/2ML IJ SOLN
INTRAMUSCULAR | Status: AC
  Filled 2014-03-01: qty 2

## 2014-03-01 MED ORDER — FENTANYL CITRATE 0.05 MG/ML IJ SOLN
INTRAMUSCULAR | Status: AC
  Filled 2014-03-01: qty 5

## 2014-03-01 MED ORDER — METOCLOPRAMIDE HCL 5 MG/ML IJ SOLN
10.0000 mg | Freq: Four times a day (QID) | INTRAMUSCULAR | Status: AC
  Administered 2014-03-01 – 2014-03-02 (×4): 10 mg via INTRAVENOUS
  Filled 2014-03-01 (×4): qty 2

## 2014-03-01 MED ORDER — ASPIRIN EC 325 MG PO TBEC
325.0000 mg | DELAYED_RELEASE_TABLET | Freq: Every day | ORAL | Status: DC
  Administered 2014-03-03 – 2014-03-04 (×2): 325 mg via ORAL
  Filled 2014-03-01 (×3): qty 1

## 2014-03-01 MED ORDER — PHENYLEPHRINE HCL 10 MG/ML IJ SOLN
20.0000 mg | INTRAMUSCULAR | Status: DC | PRN
  Administered 2014-03-01: 20 ug/min via INTRAVENOUS

## 2014-03-01 MED ORDER — VANCOMYCIN HCL IN DEXTROSE 1-5 GM/200ML-% IV SOLN
1000.0000 mg | Freq: Once | INTRAVENOUS | Status: AC
  Administered 2014-03-01: 1000 mg via INTRAVENOUS
  Filled 2014-03-01: qty 200

## 2014-03-01 MED ORDER — PROTAMINE SULFATE 10 MG/ML IV SOLN
INTRAVENOUS | Status: DC | PRN
Start: 2014-03-01 — End: 2014-03-01
  Administered 2014-03-01 (×6): 50 mg via INTRAVENOUS
  Administered 2014-03-01: 20 mg via INTRAVENOUS

## 2014-03-01 MED ORDER — VECURONIUM BROMIDE 10 MG IV SOLR
INTRAVENOUS | Status: DC | PRN
  Administered 2014-03-01: 10 mg via INTRAVENOUS

## 2014-03-01 MED ORDER — LACTATED RINGERS IV SOLN: 500.0000 mL | Freq: Once | INTRAVENOUS | Status: AC | PRN

## 2014-03-01 MED ORDER — SODIUM CHLORIDE 0.9 % IJ SOLN
3.0000 mL | Freq: Two times a day (BID) | INTRAMUSCULAR | Status: DC
  Administered 2014-03-02 – 2014-03-04 (×3): 3 mL via INTRAVENOUS

## 2014-03-01 MED ORDER — LEVALBUTEROL HCL 0.63 MG/3ML IN NEBU
0.6300 mg | INHALATION_SOLUTION | RESPIRATORY_TRACT | Status: DC
  Administered 2014-03-01 – 2014-03-04 (×16): 0.63 mg via RESPIRATORY_TRACT
  Filled 2014-03-01 (×31): qty 3

## 2014-03-01 MED ORDER — PROPOFOL 10 MG/ML IV BOLUS
INTRAVENOUS | Status: DC | PRN
  Administered 2014-03-01: 80 mg via INTRAVENOUS

## 2014-03-01 MED ORDER — ARTIFICIAL TEARS OP OINT
TOPICAL_OINTMENT | OPHTHALMIC | Status: AC
  Filled 2014-03-01: qty 3.5

## 2014-03-01 MED ORDER — ALBUMIN HUMAN 5 % IV SOLN
250.0000 mL | INTRAVENOUS | Status: AC | PRN
  Administered 2014-03-02: 250 mL via INTRAVENOUS

## 2014-03-01 MED ORDER — ASPIRIN 81 MG PO CHEW: 324.0000 mg | CHEWABLE_TABLET | Freq: Every day | ORAL | Status: DC

## 2014-03-01 MED ORDER — 0.9 % SODIUM CHLORIDE (POUR BTL) OPTIME
TOPICAL | Status: DC | PRN
  Administered 2014-03-01: 6000 mL

## 2014-03-01 MED ORDER — DOPAMINE-DEXTROSE 3.2-5 MG/ML-% IV SOLN: 3.0000 ug/kg/min | INTRAVENOUS | Status: AC

## 2014-03-01 MED ORDER — DEXMEDETOMIDINE HCL IN NACL 200 MCG/50ML IV SOLN
0.1000 ug/kg/h | INTRAVENOUS | Status: DC
  Administered 2014-03-01: 0.4 ug/kg/h via INTRAVENOUS
  Administered 2014-03-01 – 2014-03-02 (×2): 0.5 ug/kg/h via INTRAVENOUS
  Filled 2014-03-01 (×4): qty 50

## 2014-03-01 MED ORDER — PHENYLEPHRINE 40 MCG/ML (10ML) SYRINGE FOR IV PUSH (FOR BLOOD PRESSURE SUPPORT)
PREFILLED_SYRINGE | INTRAVENOUS | Status: AC
  Filled 2014-03-01: qty 10

## 2014-03-01 MED ORDER — ACETAMINOPHEN 160 MG/5ML PO SOLN: 650.0000 mg | Freq: Once | ORAL | Status: AC

## 2014-03-01 MED ORDER — PROTAMINE SULFATE 10 MG/ML IV SOLN
INTRAVENOUS | Status: AC
  Filled 2014-03-01: qty 25

## 2014-03-01 MED ORDER — HEMOSTATIC AGENTS (NO CHARGE) OPTIME
TOPICAL | Status: DC | PRN
  Administered 2014-03-01: 1 via TOPICAL

## 2014-03-01 MED ORDER — BISACODYL 5 MG PO TBEC
10.0000 mg | DELAYED_RELEASE_TABLET | Freq: Every day | ORAL | Status: DC
  Administered 2014-03-03 – 2014-03-04 (×2): 10 mg via ORAL
  Filled 2014-03-01 (×2): qty 2

## 2014-03-01 MED ORDER — DEXTROSE 5 % IV SOLN
1.5000 g | Freq: Two times a day (BID) | INTRAVENOUS | Status: AC
  Administered 2014-03-01 – 2014-03-03 (×4): 1.5 g via INTRAVENOUS
  Filled 2014-03-01 (×6): qty 1.5

## 2014-03-01 MED ORDER — LACTATED RINGERS IV SOLN
INTRAVENOUS | Status: DC | PRN
  Administered 2014-03-01 (×2): via INTRAVENOUS

## 2014-03-01 MED ORDER — SODIUM CHLORIDE 0.9 % IJ SOLN
INTRAMUSCULAR | Status: AC
  Filled 2014-03-01: qty 20

## 2014-03-01 MED ORDER — ACETAMINOPHEN 500 MG PO TABS
1000.0000 mg | ORAL_TABLET | Freq: Four times a day (QID) | ORAL | Status: DC
  Administered 2014-03-02 – 2014-03-04 (×8): 1000 mg via ORAL
  Filled 2014-03-01 (×12): qty 2

## 2014-03-01 MED ORDER — THROMBIN 20000 UNITS EX KIT
PACK | CUTANEOUS | Status: DC | PRN
  Administered 2014-03-01: 20000 [IU] via TOPICAL

## 2014-03-01 MED ORDER — SODIUM CHLORIDE 0.9 % IV SOLN: 250.0000 mL | INTRAVENOUS | Status: DC

## 2014-03-01 MED ORDER — ACETAMINOPHEN 650 MG RE SUPP
650.0000 mg | Freq: Once | RECTAL | Status: AC
  Administered 2014-03-01: 650 mg via RECTAL

## 2014-03-01 MED ORDER — VECURONIUM BROMIDE 10 MG IV SOLR
INTRAVENOUS | Status: AC
  Filled 2014-03-01: qty 10

## 2014-03-01 MED ORDER — PHENYLEPHRINE HCL 10 MG/ML IJ SOLN
INTRAMUSCULAR | Status: DC | PRN
  Administered 2014-03-01 (×2): 80 ug via INTRAVENOUS

## 2014-03-01 MED ORDER — MIDAZOLAM HCL 2 MG/2ML IJ SOLN: 2.0000 mg | INTRAMUSCULAR | Status: DC | PRN

## 2014-03-01 MED ORDER — MORPHINE SULFATE 2 MG/ML IJ SOLN
1.0000 mg | INTRAMUSCULAR | Status: AC | PRN
  Administered 2014-03-01 – 2014-03-02 (×2): 2 mg via INTRAVENOUS
  Filled 2014-03-01 (×2): qty 1

## 2014-03-01 MED ORDER — THROMBIN 20000 UNITS EX SOLR
CUTANEOUS | Status: AC
  Filled 2014-03-01: qty 20000

## 2014-03-01 MED ORDER — HEPARIN SODIUM (PORCINE) 1000 UNIT/ML IJ SOLN
INTRAMUSCULAR | Status: AC
  Filled 2014-03-01: qty 1

## 2014-03-01 MED ORDER — METOPROLOL TARTRATE 1 MG/ML IV SOLN
2.5000 mg | INTRAVENOUS | Status: DC | PRN
  Administered 2014-03-02 – 2014-03-04 (×4): 5 mg via INTRAVENOUS
  Administered 2014-03-04: 2.5 mg via INTRAVENOUS
  Filled 2014-03-01 (×5): qty 5

## 2014-03-01 MED ORDER — FENTANYL CITRATE 0.05 MG/ML IJ SOLN
INTRAMUSCULAR | Status: DC | PRN
  Administered 2014-03-01: 100 ug via INTRAVENOUS
  Administered 2014-03-01: 250 ug via INTRAVENOUS
  Administered 2014-03-01 (×2): 150 ug via INTRAVENOUS
  Administered 2014-03-01: 100 ug via INTRAVENOUS
  Administered 2014-03-01: 250 ug via INTRAVENOUS

## 2014-03-01 MED ORDER — FAMOTIDINE IN NACL 20-0.9 MG/50ML-% IV SOLN
20.0000 mg | Freq: Two times a day (BID) | INTRAVENOUS | Status: AC
  Administered 2014-03-01: 20 mg via INTRAVENOUS
  Filled 2014-03-01: qty 50

## 2014-03-01 MED ORDER — LIDOCAINE HCL (CARDIAC) 20 MG/ML IV SOLN
INTRAVENOUS | Status: AC
  Filled 2014-03-01: qty 5

## 2014-03-01 MED ORDER — SODIUM CHLORIDE 0.45 % IV SOLN: INTRAVENOUS | Status: DC

## 2014-03-01 MED ORDER — SODIUM CHLORIDE 0.9 % IV SOLN
INTRAVENOUS | Status: DC
  Administered 2014-03-02: 3.4 [IU]/h via INTRAVENOUS
  Filled 2014-03-01 (×2): qty 1

## 2014-03-01 MED ORDER — LACTATED RINGERS IV SOLN: INTRAVENOUS | Status: DC

## 2014-03-01 MED ORDER — SODIUM CHLORIDE 0.9 % IJ SOLN: 3.0000 mL | INTRAMUSCULAR | Status: DC | PRN

## 2014-03-01 MED ORDER — MIDAZOLAM HCL 5 MG/5ML IJ SOLN
INTRAMUSCULAR | Status: DC | PRN
  Administered 2014-03-01: 2 mg via INTRAVENOUS
  Administered 2014-03-01: 4 mg via INTRAVENOUS

## 2014-03-01 MED ORDER — LIDOCAINE HCL (CARDIAC) 20 MG/ML IV SOLN
INTRAVENOUS | Status: DC | PRN
  Administered 2014-03-01: 60 mg via INTRAVENOUS

## 2014-03-01 MED ORDER — ONDANSETRON HCL 4 MG/2ML IJ SOLN: 4.0000 mg | Freq: Four times a day (QID) | INTRAMUSCULAR | Status: DC | PRN

## 2014-03-01 MED ORDER — ARTIFICIAL TEARS OP OINT
TOPICAL_OINTMENT | OPHTHALMIC | Status: DC | PRN
  Administered 2014-03-01: 1 via OPHTHALMIC

## 2014-03-01 MED ORDER — MORPHINE SULFATE 2 MG/ML IJ SOLN
2.0000 mg | INTRAMUSCULAR | Status: DC | PRN
  Administered 2014-03-02 (×2): 2 mg via INTRAVENOUS
  Administered 2014-03-02 – 2014-03-03 (×2): 4 mg via INTRAVENOUS
  Filled 2014-03-01 (×2): qty 1
  Filled 2014-03-01 (×2): qty 2

## 2014-03-01 MED ORDER — PROPOFOL 10 MG/ML IV BOLUS
INTRAVENOUS | Status: AC
  Filled 2014-03-01: qty 20

## 2014-03-01 MED ORDER — NITROGLYCERIN IN D5W 200-5 MCG/ML-% IV SOLN: 0.0000 ug/min | INTRAVENOUS | Status: DC

## 2014-03-01 MED ORDER — SODIUM CHLORIDE 0.9 % IV SOLN
INTRAVENOUS | Status: DC
  Administered 2014-03-01: 14:00:00 via INTRAVENOUS

## 2014-03-01 MED ORDER — METOPROLOL TARTRATE 25 MG/10 ML ORAL SUSPENSION
12.5000 mg | Freq: Two times a day (BID) | ORAL | Status: DC
  Filled 2014-03-01 (×7): qty 5

## 2014-03-01 MED ORDER — MAGNESIUM SULFATE 4000MG/100ML IJ SOLN
4.0000 g | Freq: Once | INTRAMUSCULAR | Status: AC
  Administered 2014-03-01: 4 g via INTRAVENOUS

## 2014-03-01 MED ORDER — HEPARIN SODIUM (PORCINE) 1000 UNIT/ML IJ SOLN
INTRAMUSCULAR | Status: DC | PRN
  Administered 2014-03-01: 35000 [IU] via INTRAVENOUS

## 2014-03-01 MED ORDER — BISACODYL 10 MG RE SUPP: 10.0000 mg | Freq: Every day | RECTAL | Status: DC

## 2014-03-01 MED ORDER — DOCUSATE SODIUM 100 MG PO CAPS
200.0000 mg | ORAL_CAPSULE | Freq: Every day | ORAL | Status: DC
  Administered 2014-03-03 – 2014-03-04 (×2): 200 mg via ORAL
  Filled 2014-03-01 (×2): qty 2

## 2014-03-01 MED ORDER — DEXTROSE 5 % IV SOLN
0.0000 ug/min | INTRAVENOUS | Status: DC
  Administered 2014-03-02: 20 ug/min via INTRAVENOUS
  Filled 2014-03-01 (×2): qty 2

## 2014-03-01 MED ORDER — POTASSIUM CHLORIDE 10 MEQ/50ML IV SOLN: 10.0000 meq | INTRAVENOUS | Status: AC

## 2014-03-01 MED ORDER — ACETAMINOPHEN 160 MG/5ML PO SOLN
1000.0000 mg | Freq: Four times a day (QID) | ORAL | Status: DC
  Administered 2014-03-01 – 2014-03-02 (×2): 1000 mg
  Filled 2014-03-01 (×2): qty 40.6

## 2014-03-01 MED ORDER — SODIUM CHLORIDE 0.9 % IV SOLN
10.0000 g | INTRAVENOUS | Status: DC | PRN
  Administered 2014-03-01: 5 g/h via INTRAVENOUS

## 2014-03-01 MED ORDER — OXYCODONE HCL 5 MG PO TABS
5.0000 mg | ORAL_TABLET | ORAL | Status: DC | PRN
  Administered 2014-03-02: 10 mg via ORAL
  Administered 2014-03-03 – 2014-03-04 (×3): 5 mg via ORAL
  Administered 2014-03-04: 10 mg via ORAL
  Filled 2014-03-01: qty 1
  Filled 2014-03-01: qty 2
  Filled 2014-03-01: qty 1
  Filled 2014-03-01: qty 2
  Filled 2014-03-01: qty 1

## 2014-03-01 MED ORDER — PANTOPRAZOLE SODIUM 40 MG PO TBEC
40.0000 mg | DELAYED_RELEASE_TABLET | Freq: Every day | ORAL | Status: DC
  Administered 2014-03-03 – 2014-03-04 (×2): 40 mg via ORAL
  Filled 2014-03-01 (×2): qty 1

## 2014-03-01 MED ORDER — SODIUM BICARBONATE 8.4 % IV SOLN
50.0000 meq | Freq: Once | INTRAVENOUS | Status: AC
  Administered 2014-03-01: 50 meq via INTRAVENOUS

## 2014-03-01 SURGICAL SUPPLY — 99 items
ADAPTER CARDIO PERF ANTE/RETRO (ADAPTER) ×2 IMPLANT
ADPR PRFSN 84XANTGRD RTRGD (ADAPTER) ×2
ATTRACTOMAT 16X20 MAGNETIC DRP (DRAPES) ×4 IMPLANT
BAG DECANTER FOR FLEXI CONT (MISCELLANEOUS) ×4 IMPLANT
BANDAGE ELASTIC 4 VELCRO ST LF (GAUZE/BANDAGES/DRESSINGS) ×4 IMPLANT
BANDAGE ELASTIC 6 VELCRO ST LF (GAUZE/BANDAGES/DRESSINGS) ×4 IMPLANT
BANDAGE GAUZE ELAST BULKY 4 IN (GAUZE/BANDAGES/DRESSINGS) ×4 IMPLANT
BLADE CORE FAN STRYKER (BLADE) ×2 IMPLANT
BLADE SURG 11 STRL SS (BLADE) ×2 IMPLANT
BLADE SURG ROTATE 9660 (MISCELLANEOUS) IMPLANT
BNDG GAUZE ELAST 4 BULKY (GAUZE/BANDAGES/DRESSINGS) ×2 IMPLANT
CABLE PACING FASLOC BIEGE (MISCELLANEOUS) ×2 IMPLANT
CANISTER SUCTION 2500CC (MISCELLANEOUS) ×4 IMPLANT
CANNULA GUNDRY RCSP 15FR (MISCELLANEOUS) ×2 IMPLANT
CARDIAC SUCTION (MISCELLANEOUS) ×4 IMPLANT
CATH ROBINSON RED A/P 18FR (CATHETERS) ×10 IMPLANT
CATH THORACIC 28FR (CATHETERS) ×4 IMPLANT
CATH THORACIC 36FR (CATHETERS) ×4 IMPLANT
CATH THORACIC 36FR RT ANG (CATHETERS) ×4 IMPLANT
CLIP FOGARTY SPRING 6M (CLIP) IMPLANT
CLIP TI MEDIUM 24 (CLIP) IMPLANT
CLIP TI WIDE RED SMALL 24 (CLIP) IMPLANT
COVER SURGICAL LIGHT HANDLE (MISCELLANEOUS) ×8 IMPLANT
CRADLE DONUT ADULT HEAD (MISCELLANEOUS) ×4 IMPLANT
DRAPE CARDIOVASCULAR INCISE (DRAPES) ×4
DRAPE SLUSH/WARMER DISC (DRAPES) ×2 IMPLANT
DRAPE SRG 135X102X78XABS (DRAPES) ×2 IMPLANT
DRSG AQUACEL AG ADV 3.5X14 (GAUZE/BANDAGES/DRESSINGS) ×2 IMPLANT
DRSG COVADERM 4X14 (GAUZE/BANDAGES/DRESSINGS) ×4 IMPLANT
ELECT CAUTERY BLADE 6.4 (BLADE) ×4 IMPLANT
ELECT REM PT RETURN 9FT ADLT (ELECTROSURGICAL) ×8
ELECTRODE REM PT RTRN 9FT ADLT (ELECTROSURGICAL) ×4 IMPLANT
GAUZE SPONGE 2X2 8PLY STRL LF (GAUZE/BANDAGES/DRESSINGS) IMPLANT
GLOVE BIO SURGEON STRL SZ 6 (GLOVE) ×4 IMPLANT
GLOVE BIO SURGEON STRL SZ 6.5 (GLOVE) ×2 IMPLANT
GLOVE BIO SURGEON STRL SZ7 (GLOVE) IMPLANT
GLOVE BIO SURGEON STRL SZ7.5 (GLOVE) IMPLANT
GLOVE BIO SURGEONS STRL SZ 6.5 (GLOVE) ×2
GLOVE BIOGEL PI IND STRL 6 (GLOVE) IMPLANT
GLOVE BIOGEL PI IND STRL 6.5 (GLOVE) IMPLANT
GLOVE BIOGEL PI IND STRL 7.0 (GLOVE) IMPLANT
GLOVE BIOGEL PI INDICATOR 6 (GLOVE)
GLOVE BIOGEL PI INDICATOR 6.5 (GLOVE)
GLOVE BIOGEL PI INDICATOR 7.0 (GLOVE) ×12
GLOVE EUDERMIC 7 POWDERFREE (GLOVE) ×8 IMPLANT
GLOVE ORTHO TXT STRL SZ7.5 (GLOVE) IMPLANT
GOWN STRL REUS W/ TWL LRG LVL3 (GOWN DISPOSABLE) ×8 IMPLANT
GOWN STRL REUS W/ TWL XL LVL3 (GOWN DISPOSABLE) ×2 IMPLANT
GOWN STRL REUS W/TWL LRG LVL3 (GOWN DISPOSABLE) ×16
GOWN STRL REUS W/TWL XL LVL3 (GOWN DISPOSABLE) ×4
HEMOSTAT POWDER SURGIFOAM 1G (HEMOSTASIS) ×12 IMPLANT
HEMOSTAT SURGICEL 2X14 (HEMOSTASIS) ×4 IMPLANT
INSERT FOGARTY 61MM (MISCELLANEOUS) IMPLANT
INSERT FOGARTY XLG (MISCELLANEOUS) IMPLANT
KIT BASIN OR (CUSTOM PROCEDURE TRAY) ×4 IMPLANT
KIT CATH CPB BARTLE (MISCELLANEOUS) ×4 IMPLANT
KIT ROOM TURNOVER OR (KITS) ×4 IMPLANT
KIT SUCTION CATH 14FR (SUCTIONS) ×4 IMPLANT
KIT VASOVIEW W/TROCAR VH 2000 (KITS) ×4 IMPLANT
LINE VENT (MISCELLANEOUS) ×2 IMPLANT
NS IRRIG 1000ML POUR BTL (IV SOLUTION) ×20 IMPLANT
PACK OPEN HEART (CUSTOM PROCEDURE TRAY) ×4 IMPLANT
PAD ARMBOARD 7.5X6 YLW CONV (MISCELLANEOUS) ×8 IMPLANT
PAD DEFIB R2 (MISCELLANEOUS) ×4 IMPLANT
PAD ELECT DEFIB RADIOL ZOLL (MISCELLANEOUS) ×4 IMPLANT
PENCIL BUTTON HOLSTER BLD 10FT (ELECTRODE) ×4 IMPLANT
PUNCH AORTIC ROTATE 4.0MM (MISCELLANEOUS) ×2 IMPLANT
PUNCH AORTIC ROTATE 4.5MM 8IN (MISCELLANEOUS) ×2 IMPLANT
PUNCH AORTIC ROTATE 5MM 8IN (MISCELLANEOUS) IMPLANT
SEALANT SURG COSEAL 4ML (VASCULAR PRODUCTS) ×2 IMPLANT
SET CARDIOPLEGIA MPS 5001102 (MISCELLANEOUS) ×2 IMPLANT
SOLUTION ANTI FOG 6CC (MISCELLANEOUS) ×2 IMPLANT
SPONGE GAUZE 2X2 STER 10/PKG (GAUZE/BANDAGES/DRESSINGS) ×2
SPONGE GAUZE 4X4 12PLY (GAUZE/BANDAGES/DRESSINGS) ×8 IMPLANT
SPONGE GAUZE 4X4 12PLY STER LF (GAUZE/BANDAGES/DRESSINGS) ×2 IMPLANT
SPONGE INTESTINAL PEANUT (DISPOSABLE) IMPLANT
SPONGE LAP 4X18 X RAY DECT (DISPOSABLE) ×4 IMPLANT
SUT BONE WAX W31G (SUTURE) ×4 IMPLANT
SUT MNCRL AB 4-0 PS2 18 (SUTURE) ×2 IMPLANT
SUT PROLENE 3 0 SH DA (SUTURE) IMPLANT
SUT PROLENE 3 0 SH1 36 (SUTURE) ×4 IMPLANT
SUT PROLENE 4 0 RB 1 (SUTURE)
SUT PROLENE 4-0 RB1 .5 CRCL 36 (SUTURE) IMPLANT
SUT PROLENE 7 0 BV 1 (SUTURE) ×6 IMPLANT
SUT PROLENE 7 0 BV1 MDA (SUTURE) ×4 IMPLANT
SUT PROLENE BLUE 7 0 (SUTURE) IMPLANT
SUT VIC AB 1 CTX 36 (SUTURE) ×8
SUT VIC AB 1 CTX36XBRD ANBCTR (SUTURE) ×4 IMPLANT
SUT VIC AB 2-0 CT1 27 (SUTURE) ×4
SUT VIC AB 2-0 CT1 TAPERPNT 27 (SUTURE) IMPLANT
SUTURE E-PAK OPEN HEART (SUTURE) ×4 IMPLANT
SYSTEM SAHARA CHEST DRAIN ATS (WOUND CARE) ×4 IMPLANT
TAPE CLOTH SOFT 2X10 (GAUZE/BANDAGES/DRESSINGS) ×2 IMPLANT
TOWEL OR 17X24 6PK STRL BLUE (TOWEL DISPOSABLE) ×4 IMPLANT
TOWEL OR 17X26 10 PK STRL BLUE (TOWEL DISPOSABLE) ×4 IMPLANT
TRAY FOLEY IC TEMP SENS 16FR (CATHETERS) ×4 IMPLANT
TUBING INSUFFLATION 10FT LAP (TUBING) ×4 IMPLANT
UNDERPAD 30X30 INCONTINENT (UNDERPADS AND DIAPERS) ×4 IMPLANT
WATER STERILE IRR 1000ML POUR (IV SOLUTION) ×8 IMPLANT

## 2014-03-01 NOTE — Progress Notes (Signed)
Dr. Cyndia Bent aware of decreased CI and MAP.  Atrial pacing increased to 90.  Dr. Cyndia Bent confirms that a MAP of 65-70 is adequate.  Will recheck CI in 30 minutes.  Continue to monitor.

## 2014-03-01 NOTE — Progress Notes (Addendum)
Per MD place on simv/ps,VT 550, RR 22, Fio2 60, peep 8, PS 10

## 2014-03-01 NOTE — Anesthesia Postprocedure Evaluation (Signed)
  Anesthesia Post-op Note  Patient: [name]  Procedure(s) Performed: Procedure(s): REDO CORONARY ARTERY BYPASS GRAFTING TIMES THREE, USING RIGHT GREATER SAPHENOUS VEIN VIA ENDOVEIN HARVEST. (N/A) INTRAOPERATIVE TRANSESOPHAGEAL ECHOCARDIOGRAM (N/A)  Patient Location: PACU and SICU  Anesthesia Type:General  Level of Consciousness: Patient remains intubated per anesthesia plan  Airway and Oxygen Therapy: Patient remains intubated per anesthesia plan  Post-op Pain: mild  Post-op Assessment: Post-op Vital signs reviewed  Post-op Vital Signs: Reviewed  Last Vitals:  Filed Vitals:   03/01/14 1530  BP:   Pulse: 90  Temp: 36.6 C  Resp: 16    Complications: No apparent anesthesia complications

## 2014-03-01 NOTE — Progress Notes (Signed)
Dr Aundra Dubin made aware of ABG results.3 RT did abg without any difficulty.

## 2014-03-01 NOTE — Brief Op Note (Signed)
02/25/2014 - 03/01/2014  12:07 PM  PATIENT:  [name]  73 y.o. male  PRE-OPERATIVE DIAGNOSIS:  CAD  POST-OPERATIVE DIAGNOSIS:  coronary artery disease   PROCEDURE:  Procedure(s):  REDO CORONARY ARTERY BYPASS GRAFTING x 3 -SVG to DIAGONAL -SVG to OM1 -SVG to PDA  ENDOSCOPIC SAPHENOUS VEIN HARVEST RIGHT LEG - Poor quality saphenous vein  INTRAOPERATIVE TRANSESOPHAGEAL ECHOCARDIOGRAM (N/A)  SURGEON:  Surgeon(s) and Role:    * Gaye Pollack, MD - Primary  PHYSICIAN ASSISTANT: Ellwood Handler PA-C  ANESTHESIA:   general  EBL:  Total I/O In: -  Out: 45 [Urine:50]  BLOOD ADMINISTERED: CELLSAVER  DRAINS: Mediastinal chest drains   LOCAL MEDICATIONS USED:  NONE  SPECIMEN:  No Specimen  DISPOSITION OF SPECIMEN:  N/A  COUNTS:  YES  TOURNIQUET:  * No tourniquets in log *  DICTATION: .Dragon Dictation  PLAN OF CARE: Admit to inpatient   PATIENT DISPOSITION:  ICU - intubated and hemodynamically stable.   Delay start of Pharmacological VTE agent (>24hrs) due to surgical blood loss or risk of bleeding: yes

## 2014-03-01 NOTE — Progress Notes (Signed)
  Echocardiogram Echocardiogram Transesophageal has been performed.  Carney Corners 03/01/2014, 8:30 AM

## 2014-03-01 NOTE — Anesthesia Preprocedure Evaluation (Addendum)
Anesthesia Evaluation  Patient identified by MRN, date of birth, ID band Patient awake    Reviewed: Allergy & Precautions, H&P , NPO status , Patient's Chart, lab work & pertinent test results, reviewed documented beta blocker date and time   History of Anesthesia Complications Negative for: history of anesthetic complications  Airway Mallampati: III TM Distance: >3 FB Neck ROM: Full    Dental  (+) Edentulous Upper, Edentulous Lower   Pulmonary sleep apnea and Continuous Positive Airway Pressure Ventilation , COPDCurrent Smoker,  breath sounds clear to auscultation        Cardiovascular hypertension, Pt. on medications and Pt. on home beta blockers + CAD, + Past MI, + Cardiac Stents, + CABG (times six 1999), + Peripheral Vascular Disease and +CHF Rhythm:Regular Rate:Normal  ST elevation, on 53mcg/min nitroglycerin, no current chest pain  Ef 50%   Neuro/Psych Bilateral carotid artery stenosis    GI/Hepatic Neg liver ROS, GERD-  Medicated and Controlled,  Endo/Other  diabetes, Type 2, Oral Hypoglycemic Agents  Renal/GU Renal InsufficiencyRenal diseaseBaseline creatinine 1.5     Musculoskeletal   Abdominal   Peds  Hematology   Anesthesia Other Findings   Reproductive/Obstetrics                          Anesthesia Physical Anesthesia Plan  ASA: IV  Anesthesia Plan: General   Post-op Pain Management:    Induction: Intravenous  Airway Management Planned: Oral ETT  Additional Equipment: Arterial line, CVP, PA Cath, 3D TEE and Ultrasound Guidance Line Placement  Intra-op Plan:   Post-operative Plan: Post-operative intubation/ventilation  Informed Consent: I have reviewed the patients History and Physical, chart, labs and discussed the procedure including the risks, benefits and alternatives for the proposed anesthesia with the patient or authorized representative who has indicated his/her  understanding and acceptance.   Dental advisory given  Plan Discussed with: Anesthesiologist, Surgeon and CRNA  Anesthesia Plan Comments:        Anesthesia Quick Evaluation

## 2014-03-01 NOTE — Progress Notes (Addendum)
Dr Cyndia Bent aware of vent changes to RR 22 and peep at 8 due to abg results

## 2014-03-01 NOTE — Progress Notes (Signed)
Patient ID: [name], male   DOB: 02/04/1941, 73 y.o.   MRN: [id number] EVENING ROUNDS NOTE :     [address]       [place] [address]             [phone]                 Day of Surgery Procedure(s) (LRB): REDO CORONARY ARTERY BYPASS GRAFTING TIMES THREE, USING RIGHT GREATER SAPHENOUS VEIN VIA ENDOVEIN HARVEST. (N/A) INTRAOPERATIVE TRANSESOPHAGEAL ECHOCARDIOGRAM (N/A)  Total Length of Stay:  LOS: 4 days  BP 107/62  Pulse 90  Temp(Src) 98.6 F (37 C) (Oral)  Resp 0  Ht 5' 6.14" (1.68 m)  Wt 178 lb 9.2 oz (81 kg)  BMI 28.70 kg/m2  SpO2 95%  .Intake/Output     05/14 0701 - 05/15 0700 05/15 0701 - 05/16 0700   P.O. 200    I.V. (mL/kg) 1117 (13.8) 2737.1 (33.8)   IV Piggyback 50 100   Total Intake(mL/kg) 1367 (16.9) 2837.1 (35)   Urine (mL/kg/hr) 2950 (1.5) 1125 (1.2)   Chest Tube  90 (0.1)   Total Output 2950 1215   Net -1583 +1622.1          . sodium chloride 20 mL/hr at 03/01/14 1800  . sodium chloride 20 mL/hr at 03/01/14 1800  . [START ON 03/02/2014] sodium chloride    . dexmedetomidine 0.401 mcg/kg/hr (03/01/14 1800)  . DOPamine 3 mcg/kg/min (03/01/14 1800)  . insulin (NOVOLIN-R) infusion 3.4 Units/hr (03/01/14 1700)  . lactated ringers Stopped (03/01/14 1415)  . nitroGLYCERIN Stopped (03/01/14 1400)  . phenylephrine (NEO-SYNEPHRINE) Adult infusion 20 mcg/min (03/01/14 1800)     Lab Results  Component Value Date   WBC 19.2* 03/01/2014   HGB 10.9* 03/01/2014   HCT 32.0* 03/01/2014   PLT 126* 03/01/2014   GLUCOSE 158* 03/01/2014   CHOL 136 03/03/2011   TRIG 134 03/03/2011   HDL 34* 03/03/2011   LDLCALC  Value: 75        Total Cholesterol/HDL:CHD Risk Coronary Heart Disease Risk Table                     Men   Women  1/2 Average Risk   3.4   3.3  Average Risk       5.0   4.4  2 X Average Risk   9.6   7.1  3 X Average Risk  23.4   11.0        Use the calculated Patient Ratio above and the CHD Risk Table to determine the patient's CHD Risk.         ATP III CLASSIFICATION (LDL):  <100     mg/dL   Optimal  100-129  mg/dL   Near or Above                    Optimal  130-159  mg/dL   Borderline  160-189  mg/dL   High  >190     mg/dL   Very High 03/03/2011   ALT 24 09/24/2013   AST 19 09/24/2013   NA 134* 03/01/2014   K 4.7 03/01/2014   CL 100 03/01/2014   CREATININE 2.06* 03/01/2014   BUN 24* 03/01/2014   CO2 21 03/01/2014   TSH 2.750 02/26/2014   INR 1.47 03/01/2014   HGBA1C 7.4* 02/26/2014   Sedated on vent  Chest xray ok-  Oxygenation poor - rate on 22, fio2 60 %  peep 8 , will not be rapid wean candidate  Not  Bleeding  Grace Isaac MD  Beeper [phone] Office [phone] 03/01/2014 6:10 PM

## 2014-03-01 NOTE — Op Note (Signed)
CARDIOVASCULAR SURGERY OPERATIVE NOTE  03/01/2014  Surgeon:  Gaye Pollack, MD  First Assistant: Ellwood Handler,  PA-C   Preoperative Diagnosis:  Severe left main and multi-vessel coronary artery disease and vein graft disease, s/p CABG in 1999   Postoperative Diagnosis:  Same   Procedure:  1. Redo Median Sternotomy 2. Extracorporeal circulation 3.   Redo Coronary artery bypass grafting x 3   SVG to PDA  SVG to D1  SVG to OM1  4.   Endoscopic vein harvest from the right leg   Anesthesia:  General Endotracheal   Clinical History/Surgical Indication:  The patient has a long complex medical history including diabetes, hyperlipidemia, chronic kidney disease, bilateral carotid artery disease, ongoing heavy smoking and COPD, OSA, aortoiliac and peripheral vascular disease, as well as coronary disease, s/p CABG x 6 by me on 12/16/1997. [name] has a LIMA to the LAD, a sequential SVG to D2 and D3, and a sequential SVG to AM, PDA, and PL. [name] has continued to smoke since his original surgery and has undergone stenting of the RCA vein graft in 2007 and 2009. [name] is now admitted with acute onset of chest pain and ruled in for NSTEMI 2 days ago with a troponin of 0.49. Cardiac cath yesterday showed a calcified LM with 90% distal stenosis involving the ostial LCX and LAD. This compromises the large D1, septal perforators, and the entire LCX system. The LCX has an ostial 99% stenosis and there is a moderate OM1 that has 99% proximal stenosis. The remainder of the marginal branches are small. The RCA is occluded proximally. The vein graft to the right coronary system is severely and diffusely diseased and only supplies the PDA now. The vein graft to the diagonals is patent with 60-70% stenosis in one of the diagonal branches. The LIMA to the LAD is patent. LVEF is 50%. [name] had pain during the procedure and has  continued to have pain on IV NTG and Heparin. [name] has been on Plavix but his P2Y12 was over 300 so either [name] is a non-responder or non-compliant with it. [name] has high grade LM and multivessel CAD with tight ostial LCx stenosis and diffuse RCA vein graft stenosis compromising the LCX system, large D1, and the PDA. I think the D1 and OM1 and PDA are of graftable size but the distal LCX is likely not graftable due to the small size, especially in a redo with the scarring that occurs and the limited mobility of the heart to expose this area well. The D1 and OM1 will be hard enough to graft due to the limited mobility and the frequent intramyocardial course of these branches. This would probably leave the distal LCX ungrafted. [name] is a high risk surgical patient due to redo, chronic renal disease, aortoiliac and PVD, carotid artery disease, ongoing smoking and COPD. The STS risk of mortality is 8.58% and risk of major morbidity and mortality is 39%, including a 17% risk of renal failure, 28% risk of prolonged mechanical ventilation, and 3.8% risk of stroke. I will order PFT's and vein mapping of the right leg to be sure that there is conduit. I discussed the operative procedure with the patient and family including alternatives, benefits and risks; including but not limited to bleeding, blood transfusion, infection, stroke, myocardial infarction, graft failure, heart block requiring a permanent pacemaker, organ dysfunction, and death. [name] understands and agrees to proceed if we feel that surgery is the best alternative. Over the past two days  [name] has developed worsening of renal function and hypoxemia but has remained hemodynamically stable. [name] has continued to have episodes of chest pain despite IV heparin and NTG. I felt that we should proceed with surgery despite the worsening of his renal function and hypoxemia because I thought these were most likely due to congestive heart failure.   Preparation:  The  patient was seen in the preoperative holding area and the correct patient, correct operation were confirmed with the patient after reviewing the medical record and catheterization. The consent was signed by me. Preoperative antibiotics were given. A pulmonary arterial line and radial arterial line were placed by the anesthesia team. The patient was taken back to the operating room and positioned supine on the operating room table. After being placed under general endotracheal anesthesia by the anesthesia team a foley catheter was placed. The neck, chest, abdomen, and both legs were prepped with betadine soap and solution and draped in the usual sterile manner. A surgical time-out was taken and the correct patient and operative procedure were confirmed with the nursing and anesthesia staff.  TEE: Performed by Dr. Finis Bud and reviewed by me. This showed good LV function, trivial to mild central MR, no AS or AI.   Cardiopulmonary Bypass:  A median sternotomy was performed using the oscillating saw without difficulty. Bone hooks were used to retract the sternum and the pericardium was dissected from the back of the sternum. The pericardium was opened in the midline and dissected from the heart. There were moderately dense adhesions that were divided using electrocautery and scissors. Right ventricular function appeared normal. The ascending aorta was of normal size and had no palpable plaque. There were no contraindications to aortic cannulation or cross-clamping. The old vein grafts were identified and not manipulated. The patient was fully systemically heparinized and the ACT was maintained > 400 sec. The proximal aortic arch was cannulated with a 20 F aortic cannula for arterial inflow. Venous cannulation was performed via the right atrial appendage using a two-staged venous cannula. An antegrade cardioplegia/vent cannula was inserted into the mid-ascending aorta. A retrograde cardioplegia cannula was  inserted into the coronary sinus via the right atrium. Aortic occlusion was performed with a single cross-clamp. Systemic cooling to 28 degrees Centigrade and topical cooling of the heart with iced saline were used. The left internal mammary pedicle was identified as it entered the pericardial cavity laterally and was encircled and clamped with an atraumatic vascular clamp. Hyperkalemic antegrade and retrograde cold blood cardioplegia was used to induce diastolic arrest and was then given at about 20 minute intervals throughout the period of arrest to maintain myocardial temperature at or below 10 degrees centigrade. A temperature probe was inserted into the interventricular septum and an insulating pad was placed in the pericardium. The    Endoscopic vein harvest:  The right greater saphenous vein was harvested endoscopically through a 2 cm incision medial to the right knee. It was harvested from the upper thigh to below the knee. It was a medium-sized vein of good quality. The side branches were all ligated with 4-0 silk ties.    Coronary arteries:  The coronary arteries were examined.   LAD:  Some distal plaque palpable with a patent LIMA graft to the distal LAD. The D1 was completely intramyocardial and was located in its mid-portion where it was a large graftable vessel.  LCX:  The OM 1 was also completely intramyocardial and was located in the muscle in its mid-portion. It was  a moderate-sized vessel with fragile walls. The OM 2, OM3, and OM4 were too small to graft. The distal OM4 was tiny where I could see it and diffusely diseased.  RCA:  The PDA was small and diffusely diseased but graftable distally. The PL was a thread.    Grafts:  1. SVG to PDA:  1.5 mm. It was sewn end to side using 8-0 prolene continuous suture. 2. SVG to D1:  1.75 mm. It was sewn end to side using 7-0 prolene continuous suture. 3. SVG to OM1:  1.6  mm. It was sewn end to side using 7-0 prolene continuous  suture.   The proximal vein graft anastomoses of the OM1 and PDA grafts were performed to the mid-ascending aorta using continuous 6-0 prolene suture. The proximal anastomosis of the D1 graft was performed to the hood of the old RCA vein graft which was soft.  Graft markers were placed around the proximal anastomoses.   Completion:  The patient was rewarmed to 37 degrees Centigrade. The clamp -was removed from the LIMA pedicle and there was rapid warming of the septum and return of ventricular fibrillation. The crossclamp was removed. There was spontaneous return of sinus rhythm with complete heart block and no ventricular rhythm. The distal and proximal anastomoses were checked for hemostasis. The position of the grafts was satisfactory. Two temporary epicardial pacing wires were placed on the right atrium and two on the right ventricle. [name] was paced DOO. The patient was weaned from CPB without difficulty on dopamine at 3 mcg/kg/min. Cardiac output was 6 L/min. TEE showed preserved LV function with trivial MR.  Aortic and venous cannulas removed. Hemostasis was achieved. Mediastinal and left pleural drainage tubes were placed. The sternum was closed with double #6 stainless steel wires. The fascia was closed with continuous # 1 vicryl suture. The subcutaneous tissue was closed with 2-0 vicryl continuous suture. The skin was closed with 3-0 vicryl subcuticular suture. All sponge, needle, and instrument counts were reported correct at the end of the case. Dry sterile dressings were placed over the incisions and around the chest tubes which were connected to pleurevac suction. The patient was then transported to the surgical intensive care unit in critical but stable condition.

## 2014-03-01 NOTE — OR Nursing (Signed)
1st call to SICU [facility])

## 2014-03-01 NOTE — Transfer of Care (Signed)
Immediate Anesthesia Transfer of Care Note  Patient: [name]  Procedure(s) Performed: Procedure(s): REDO CORONARY ARTERY BYPASS GRAFTING TIMES THREE, USING RIGHT GREATER SAPHENOUS VEIN VIA ENDOVEIN HARVEST. (N/A) INTRAOPERATIVE TRANSESOPHAGEAL ECHOCARDIOGRAM (N/A)  Patient Location: SICU  Anesthesia Type:General  Level of Consciousness: Patient remains intubated per anesthesia plan  Airway & Oxygen Therapy: Patient remains intubated per anesthesia plan and Patient placed on Ventilator (see vital sign flow sheet for setting)  Post-op Assessment: Report given to PACU RN and Post -op Vital signs reviewed and stable  Post vital signs: Reviewed and stable  Complications: No apparent anesthesia complications

## 2014-03-01 NOTE — OR Nursing (Signed)
2nd call to SICU 

## 2014-03-02 ENCOUNTER — Inpatient Hospital Stay (HOSPITAL_COMMUNITY): Payer: Medicare Other

## 2014-03-02 LAB — POCT I-STAT 3, ART BLOOD GAS (G3+)
ACID-BASE DEFICIT: 3 mmol/L — AB (ref 0.0–2.0)
ACID-BASE DEFICIT: 4 mmol/L — AB (ref 0.0–2.0)
Acid-base deficit: 2 mmol/L (ref 0.0–2.0)
Bicarbonate: 22.4 mEq/L (ref 20.0–24.0)
Bicarbonate: 21.9 mEq/L (ref 20.0–24.0)
Bicarbonate: 21.5 mEq/L (ref 20.0–24.0)
O2 SAT: 96 %
O2 SAT: 92 %
O2 SAT: 88 %
PCO2 ART: 36.2 mmHg (ref 35.0–45.0)
PH ART: 7.401 (ref 7.350–7.450)
PO2 ART: 82 mmHg (ref 80.0–100.0)
Patient temperature: 37.7
TCO2: 23 mmol/L (ref 0–100)
TCO2: 23 mmol/L (ref 0–100)
TCO2: 23 mmol/L (ref 0–100)
pCO2 arterial: 37.9 mmHg (ref 35.0–45.0)
pCO2 arterial: 40.2 mmHg (ref 35.0–45.0)
pH, Arterial: 7.374 (ref 7.350–7.450)
pH, Arterial: 7.342 — ABNORMAL LOW (ref 7.350–7.450)
pO2, Arterial: 69 mmHg — ABNORMAL LOW (ref 80.0–100.0)
pO2, Arterial: 61 mmHg — ABNORMAL LOW (ref 80.0–100.0)

## 2014-03-02 LAB — GLUCOSE, CAPILLARY
GLUCOSE-CAPILLARY: 103 mg/dL — AB (ref 70–99)
GLUCOSE-CAPILLARY: 104 mg/dL — AB (ref 70–99)
GLUCOSE-CAPILLARY: 97 mg/dL (ref 70–99)
GLUCOSE-CAPILLARY: 121 mg/dL — AB (ref 70–99)
GLUCOSE-CAPILLARY: 121 mg/dL — AB (ref 70–99)
GLUCOSE-CAPILLARY: 100 mg/dL — AB (ref 70–99)
Glucose-Capillary: 99 mg/dL (ref 70–99)
Glucose-Capillary: 108 mg/dL — ABNORMAL HIGH (ref 70–99)
Glucose-Capillary: 127 mg/dL — ABNORMAL HIGH (ref 70–99)
Glucose-Capillary: 103 mg/dL — ABNORMAL HIGH (ref 70–99)
Glucose-Capillary: 108 mg/dL — ABNORMAL HIGH (ref 70–99)
Glucose-Capillary: 109 mg/dL — ABNORMAL HIGH (ref 70–99)
Glucose-Capillary: 111 mg/dL — ABNORMAL HIGH (ref 70–99)
Glucose-Capillary: 115 mg/dL — ABNORMAL HIGH (ref 70–99)
Glucose-Capillary: 118 mg/dL — ABNORMAL HIGH (ref 70–99)
Glucose-Capillary: 132 mg/dL — ABNORMAL HIGH (ref 70–99)
Glucose-Capillary: 134 mg/dL — ABNORMAL HIGH (ref 70–99)
Glucose-Capillary: 222 mg/dL — ABNORMAL HIGH (ref 70–99)

## 2014-03-02 LAB — BASIC METABOLIC PANEL
BUN: 22 mg/dL (ref 6–23)
CALCIUM: 7.9 mg/dL — AB (ref 8.4–10.5)
CHLORIDE: 106 meq/L (ref 96–112)
CO2: 21 mEq/L (ref 19–32)
CREATININE: 1.72 mg/dL — AB (ref 0.50–1.35)
GFR calc non Af Amer: 38 mL/min — ABNORMAL LOW (ref >90)
GFR, EST AFRICAN AMERICAN: 44 mL/min — AB (ref >90)
Glucose, Bld: 103 mg/dL — ABNORMAL HIGH (ref 70–99)
Potassium: 5.1 mEq/L (ref 3.7–5.3)
Sodium: 138 mEq/L (ref 137–147)

## 2014-03-02 LAB — CREATININE, SERUM
Creatinine, Ser: 1.72 mg/dL — ABNORMAL HIGH (ref 0.50–1.35)
GFR calc Af Amer: 44 mL/min — ABNORMAL LOW (ref >90)
GFR calc non Af Amer: 38 mL/min — ABNORMAL LOW (ref >90)

## 2014-03-02 LAB — POCT I-STAT, CHEM 8
BUN: 20 mg/dL (ref 6–23)
Calcium, Ion: 1.2 mmol/L (ref 1.13–1.30)
Chloride: 105 mEq/L (ref 96–112)
Creatinine, Ser: 1.7 mg/dL — ABNORMAL HIGH (ref 0.50–1.35)
Glucose, Bld: 139 mg/dL — ABNORMAL HIGH (ref 70–99)
HCT: 27 % — ABNORMAL LOW (ref 39.0–52.0)
Hemoglobin: 9.2 g/dL — ABNORMAL LOW (ref 13.0–17.0)
POTASSIUM: 4.9 meq/L (ref 3.7–5.3)
SODIUM: 138 meq/L (ref 137–147)
TCO2: 20 mmol/L (ref 0–100)

## 2014-03-02 LAB — CBC
HCT: 28.2 % — ABNORMAL LOW (ref 39.0–52.0)
HEMATOCRIT: 28.3 % — AB (ref 39.0–52.0)
Hemoglobin: 9.6 g/dL — ABNORMAL LOW (ref 13.0–17.0)
Hemoglobin: 9.6 g/dL — ABNORMAL LOW (ref 13.0–17.0)
MCH: 26.9 pg (ref 26.0–34.0)
MCH: 27.5 pg (ref 26.0–34.0)
MCHC: 34 g/dL (ref 30.0–36.0)
MCHC: 33.9 g/dL (ref 30.0–36.0)
MCV: 79 fL (ref 78.0–100.0)
MCV: 81.1 fL (ref 78.0–100.0)
PLATELETS: 141 10*3/uL — AB (ref 150–400)
Platelets: 139 10*3/uL — ABNORMAL LOW (ref 150–400)
RBC: 3.57 MIL/uL — AB (ref 4.22–5.81)
RBC: 3.49 MIL/uL — AB (ref 4.22–5.81)
RDW: 14.9 % (ref 11.5–15.5)
RDW: 15.4 % (ref 11.5–15.5)
WBC: 15.8 10*3/uL — AB (ref 4.0–10.5)
WBC: 14 10*3/uL — AB (ref 4.0–10.5)

## 2014-03-02 LAB — MAGNESIUM
MAGNESIUM: 2.7 mg/dL — AB (ref 1.5–2.5)
MAGNESIUM: 2.4 mg/dL (ref 1.5–2.5)

## 2014-03-02 MED ORDER — INSULIN DETEMIR 100 UNIT/ML ~~LOC~~ SOLN
15.0000 [IU] | Freq: Once | SUBCUTANEOUS | Status: AC
Start: 2014-03-02 — End: 2014-03-02
  Administered 2014-03-02: 15 [IU] via SUBCUTANEOUS
  Filled 2014-03-02: qty 0.15

## 2014-03-02 MED ORDER — INSULIN ASPART 100 UNIT/ML ~~LOC~~ SOLN
0.0000 [IU] | SUBCUTANEOUS | Status: DC
  Administered 2014-03-02: 8 [IU] via SUBCUTANEOUS
  Administered 2014-03-03 (×3): 2 [IU] via SUBCUTANEOUS
  Administered 2014-03-03: 8 [IU] via SUBCUTANEOUS
  Administered 2014-03-03: 2 [IU] via SUBCUTANEOUS
  Administered 2014-03-04: 4 [IU] via SUBCUTANEOUS
  Administered 2014-03-04: 8 [IU] via SUBCUTANEOUS

## 2014-03-02 MED ORDER — INSULIN DETEMIR 100 UNIT/ML ~~LOC~~ SOLN
15.0000 [IU] | Freq: Every day | SUBCUTANEOUS | Status: DC
  Administered 2014-03-03 – 2014-03-04 (×2): 15 [IU] via SUBCUTANEOUS
  Filled 2014-03-02 (×3): qty 0.15

## 2014-03-02 MED ORDER — INSULIN ASPART 100 UNIT/ML ~~LOC~~ SOLN: 0.0000 [IU] | SUBCUTANEOUS | Status: DC

## 2014-03-02 MED ORDER — FUROSEMIDE 10 MG/ML IJ SOLN
40.0000 mg | Freq: Once | INTRAMUSCULAR | Status: AC
  Administered 2014-03-02: 40 mg via INTRAVENOUS
  Filled 2014-03-02: qty 4

## 2014-03-02 NOTE — Procedures (Signed)
Extubation Procedure Note  Patient Details:   Name: MAHMUD KEITHLY DOB: 1941-02-01 MRN: [id number]   Airway Documentation:     Evaluation  O2 sats: stable throughout Complications: No apparent complications Patient did tolerate procedure well. Bilateral Breath Sounds: Clear;Diminished Suctioning: Oral;Airway Yes Patient extubated to [place]. [id number]. Good cuff leak. No complications. Vital signs stable at this time. RT will continue to monitor.  [place] Shown 03/02/2014, 11:21 AM

## 2014-03-02 NOTE — Progress Notes (Signed)
Patient ID: [name], male   DOB: 1941-05-26, 73 y.o.   MRN: [id number] TCTS DAILY ICU PROGRESS NOTE                   [address]            [place] [address]          [phone]   1 Day Post-Op Procedure(s) (LRB): REDO CORONARY ARTERY BYPASS GRAFTING TIMES THREE, USING RIGHT GREATER SAPHENOUS VEIN VIA ENDOVEIN HARVEST. (N/A) INTRAOPERATIVE TRANSESOPHAGEAL ECHOCARDIOGRAM (N/A)  Total Length of Stay:  LOS: 5 days   Subjective: Remains sedated on the vent , follows commands, respiratory status improved  Objective: Vital signs in last 24 hours: Temp:  [97.3 F (36.3 C)-100.2 F (37.9 C)] 99.7 F (37.6 C) (05/16 0700) Pulse Rate:  [69-90] 90 (05/16 0700) Cardiac Rhythm:  [-] Atrial paced (05/16 0726) Resp:  [0-32] 22 (05/16 0700) BP: (99-127)/(46-66) 122/55 mmHg (05/16 0600) SpO2:  [92 %-99 %] 99 % (05/16 0726) Arterial Line BP: (100-135)/(35-58) 113/49 mmHg (05/16 0700) FiO2 (%):  [40 %-100 %] 40 % (05/16 0731) Weight:  [178 lb 9.2 oz (81 kg)-192 lb 7.4 oz (87.3 kg)] 192 lb 7.4 oz (87.3 kg) (05/16 0419)  Filed Weights   03/01/14 0401 03/01/14 1400 03/02/14 0419  Weight: 177 lb 14.4 oz (80.695 kg) 178 lb 9.2 oz (81 kg) 192 lb 7.4 oz (87.3 kg)    Weight change: 10.8 oz (0.305 kg)   Hemodynamic parameters for last 24 hours: PAP: (26-35)/(14-23) 29/17 mmHg CO:  [2.1 L/min-4.5 L/min] 4.5 L/min CI:  [1.1 L/min/m2-2.4 L/min/m2] 2.4 L/min/m2  Intake/Output from previous day: 05/15 0701 - 05/16 0700 In: 4329.2 [I.V.:3629.2; IV Piggyback:700] Out: 2139 [Urine:1845; Chest Tube:294]  Intake/Output this shift:    Current Meds: Scheduled Meds: . acetaminophen  1,000 mg Oral 4 times per day   Or  . acetaminophen (TYLENOL) oral liquid 160 mg/5 mL  1,000 mg Per Tube 4 times per day  . aspirin EC  325 mg Oral Daily   Or  . aspirin  324 mg Per Tube Daily  . atorvastatin  20 mg Oral Daily  . bisacodyl  10 mg Oral Daily   Or  . bisacodyl  10 mg  Rectal Daily  . cefUROXime (ZINACEF)  IV  1.5 g Intravenous Q12H  . docusate sodium  200 mg Oral Daily  . insulin regular  0-10 Units Intravenous TID WC  . levalbuterol  0.63 mg Nebulization Q4H  . metoprolol tartrate  12.5 mg Oral BID   Or  . metoprolol tartrate  12.5 mg Per Tube BID  . [START ON 03/03/2014] pantoprazole  40 mg Oral Daily  . sodium chloride  3 mL Intravenous Q12H   Continuous Infusions: . sodium chloride 20 mL/hr at 03/02/14 0700  . sodium chloride 20 mL/hr at 03/02/14 0700  . sodium chloride    . dexmedetomidine 0.5 mcg/kg/hr (03/02/14 0700)  . DOPamine 3 mcg/kg/min (03/02/14 0700)  . insulin (NOVOLIN-R) infusion 3 Units/hr (03/02/14 0705)  . lactated ringers Stopped (03/01/14 1415)  . nitroGLYCERIN Stopped (03/01/14 1400)  . phenylephrine (NEO-SYNEPHRINE) Adult infusion 15 mcg/min (03/02/14 0700)   PRN Meds:.albumin human, metoprolol, midazolam, morphine injection, ondansetron (ZOFRAN) IV, oxyCODONE, sodium chloride  General appearance: alert, cooperative and no distress Neurologic: intact Heart: regular rate and rhythm, S1, S2 normal, no murmur, click, rub or gallop Lungs: diminished breath sounds bibasilar Abdomen: soft, non-tender; bowel sounds normal; no masses,  no organomegaly  Extremities: extremities normal, atraumatic, no cyanosis or edema and Homans sign is negative, no sign of DVT Wound: dressing intact  Lab Results: CBC: Recent Labs  03/01/14 2000 03/01/14 2004 03/02/14 0503  WBC 18.0*  --  15.8*  HGB 11.2* 10.5* 9.6*  HCT 32.2* 31.0* 28.2*  PLT 149*  --  141*   BMET:  Recent Labs  03/01/14 0303  03/01/14 2004 03/02/14 0503  NA 132*  < > 136* 138  K 5.0  < > 5.0 5.1  CL 100  --  104 106  CO2 21  --   --  21  GLUCOSE 191*  < > 102* 103*  BUN 24*  --  20 22  CREATININE 2.06*  < > 1.80* 1.72*  CALCIUM 8.6  --   --  7.9*  < > = values in this interval not displayed.  PT/INR:  Recent Labs  03/01/14 1400  LABPROT 17.4*  INR 1.47     Radiology: Dg Chest Portable 1 View In Am  03/02/2014   CLINICAL DATA:  Postop  EXAM: PORTABLE CHEST - 1 VIEW  COMPARISON:  Chest x-ray from yesterday  FINDINGS: Stable positioning of endotracheal tube, ending in the mid thoracic trachea. An orogastric tube tip ends in the upper stomach, with side port at the expected location of the GE junction. Swan-Ganz catheter via right IJ approach, tip in the region of the main pulmonary artery. Thoracic drains in stable position. No evidence of ongoing air leak. Normal heart size. Pulmonary edema has improved. Haziness of the lower chest compatible with postoperative atelectasis and small pleural effusions.  IMPRESSION: 1. Stable positioning of tubes and lines. Note the orogastric tube side port is at the GE junction. Advancement by 5 cm may improve function. 2. Improving pulmonary edema.   Electronically Signed   By: Jorje Guild M.D.   On: 03/02/2014 07:08   Dg Chest Portable 1 View  03/01/2014   CLINICAL DATA:  Postop coronary bypass  EXAM: PORTABLE CHEST - 1 VIEW  COMPARISON:  02/28/2014  FINDINGS: Endotracheal tube 3.8 cm above the carina. Swan-Ganz catheter in the central pulmonary outflow tract. Mediastinal drain and left chest tube noted. NG tube extends below the hemidiaphragm into the stomach. Normal heart size. Central vascular congestion/mild edema pattern noted. No large effusion or pneumothorax. Lower cervical fusion hardware evident.  IMPRESSION: Support apparatus in good position.  Mild central hilar vascular congestion versus edema.  No large effusion or pneumothorax   Electronically Signed   By: Daryll Brod M.D.   On: 03/01/2014 15:16   Dg Chest Port 1 View  02/28/2014   CLINICAL DATA:  Severe coronary disease with ischemia, shortness of breath and increased oxygen requirement  EXAM: PORTABLE CHEST - 1 VIEW  COMPARISON:  DG CHEST 1V PORT dated 02/25/2014  FINDINGS: Cardiac silhouette is enlarged. The patient is status post median sternotomy  and coronary artery bypass grafting. The lungs are clear. No acute osseous abnormalities, mild osteoarthritic changes within the shoulders.  IMPRESSION: No evidence of acute cardiopulmonary disease.   Electronically Signed   By: Margaree Mackintosh M.D.   On: 02/28/2014 15:42   Dg Abd Portable 1v  02/28/2014   CLINICAL DATA:  Coronary ischemia with distended abdomen  EXAM: PORTABLE ABDOMEN - 1 VIEW  COMPARISON:  None.  FINDINGS: The bowel gas pattern is normal. No radio-opaque calculi or other significant radiographic abnormality are seen.  IMPRESSION: Negative.   Electronically Signed   By: Margaree Mackintosh M.D.  On: 02/28/2014 15:43   Chronic Kidney Disease   Stage I     GFR >90  Stage II    GFR 60-89  Stage IIIA GFR 45-59  Stage IIIB GFR 30-44  Stage IV   GFR 15-29  Stage V    GFR  <15  Lab Results  Component Value Date   CREATININE 1.72* 03/02/2014   Estimated Creatinine Clearance: 40.3 ml/min (by C-G formula based on Cr of 1.72).  Assessment/Plan: S/P Procedure(s) (LRB): REDO CORONARY ARTERY BYPASS GRAFTING TIMES THREE, USING RIGHT GREATER SAPHENOUS VEIN VIA ENDOVEIN HARVEST. (N/A) INTRAOPERATIVE TRANSESOPHAGEAL ECHOCARDIOGRAM (N/A) Diuresis Diabetes control start weaning vent and try to extubate D/c mt after extubated Expected Acute  Blood - loss Anemia Chest xray looks good Chronic kidney disease Stage III stable post op    Grace Isaac 03/02/2014 8:12 AM

## 2014-03-02 NOTE — Progress Notes (Signed)
EVENING ROUNDS NOTE :     [address]       [place] [address]             [phone]                 1 Day Post-Op Procedure(s) (LRB): REDO CORONARY ARTERY BYPASS GRAFTING TIMES THREE, USING RIGHT GREATER SAPHENOUS VEIN VIA ENDOVEIN HARVEST. (N/A) INTRAOPERATIVE TRANSESOPHAGEAL ECHOCARDIOGRAM (N/A)  Total Length of Stay:  LOS: 5 days  BP 141/56  Pulse 100  Temp(Src) 98.9 F (37.2 C) (Oral)  Resp 11  Ht 5' 6.14" (1.68 m)  Wt 192 lb 7.4 oz (87.3 kg)  BMI 30.93 kg/m2  SpO2 97%  .Intake/Output     05/16 0701 - 05/17 0700   P.O. 150   I.V. (mL/kg) 558.4 (6.4)   IV Piggyback 50   Total Intake(mL/kg) 758.4 (8.7)   Urine (mL/kg/hr) 545 (0.5)   Chest Tube 100 (0.1)   Total Output 645   Net +113.4         . sodium chloride 20 mL/hr at 03/02/14 1900  . sodium chloride 20 mL/hr at 03/02/14 1900  . sodium chloride    . dexmedetomidine Stopped (03/02/14 1000)  . DOPamine Stopped (03/02/14 1400)  . insulin (NOVOLIN-R) infusion Stopped (03/02/14 1400)  . lactated ringers Stopped (03/01/14 1415)  . nitroGLYCERIN Stopped (03/01/14 1400)  . phenylephrine (NEO-SYNEPHRINE) Adult infusion Stopped (03/02/14 1000)     Lab Results  Component Value Date   WBC 14.0* 03/02/2014   HGB 9.2* 03/02/2014   HCT 27.0* 03/02/2014   PLT 139* 03/02/2014   GLUCOSE 139* 03/02/2014   CHOL 136 03/03/2011   TRIG 134 03/03/2011   HDL 34* 03/03/2011   LDLCALC  Value: 75        Total Cholesterol/HDL:CHD Risk Coronary Heart Disease Risk Table                     Men   Women  1/2 Average Risk   3.4   3.3  Average Risk       5.0   4.4  2 X Average Risk   9.6   7.1  3 X Average Risk  23.4   11.0        Use the calculated Patient Ratio above and the CHD Risk Table to determine the patient's CHD Risk.        ATP III CLASSIFICATION (LDL):  <100     mg/dL   Optimal  100-129  mg/dL   Near or Above                    Optimal  130-159  mg/dL   Borderline  160-189  mg/dL   High  >190     mg/dL   Very  High 03/03/2011   ALT 24 09/24/2013   AST 19 09/24/2013   NA 138 03/02/2014   K 4.9 03/02/2014   CL 105 03/02/2014   CREATININE 1.70* 03/02/2014   BUN 20 03/02/2014   CO2 21 03/02/2014   TSH 2.750 02/26/2014   INR 1.47 03/01/2014   HGBA1C 7.4* 02/26/2014   Stable off vent Continue antibiotics for preop e coli UTI  David Isaac MD  Beeper [phone] Office [phone] 03/02/2014 8:12 PM

## 2014-03-03 ENCOUNTER — Inpatient Hospital Stay (HOSPITAL_COMMUNITY): Payer: Medicare Other

## 2014-03-03 LAB — GLUCOSE, CAPILLARY
GLUCOSE-CAPILLARY: 203 mg/dL — AB (ref 70–99)
Glucose-Capillary: 116 mg/dL — ABNORMAL HIGH (ref 70–99)
Glucose-Capillary: 146 mg/dL — ABNORMAL HIGH (ref 70–99)
Glucose-Capillary: 139 mg/dL — ABNORMAL HIGH (ref 70–99)
Glucose-Capillary: 158 mg/dL — ABNORMAL HIGH (ref 70–99)
Glucose-Capillary: 149 mg/dL — ABNORMAL HIGH (ref 70–99)

## 2014-03-03 LAB — CBC
HCT: 26.2 % — ABNORMAL LOW (ref 39.0–52.0)
Hemoglobin: 8.7 g/dL — ABNORMAL LOW (ref 13.0–17.0)
MCH: 27 pg (ref 26.0–34.0)
MCHC: 33.2 g/dL (ref 30.0–36.0)
MCV: 81.4 fL (ref 78.0–100.0)
Platelets: 149 10*3/uL — ABNORMAL LOW (ref 150–400)
RBC: 3.22 MIL/uL — ABNORMAL LOW (ref 4.22–5.81)
RDW: 15.3 % (ref 11.5–15.5)
WBC: 13.6 10*3/uL — ABNORMAL HIGH (ref 4.0–10.5)

## 2014-03-03 LAB — BASIC METABOLIC PANEL
BUN: 24 mg/dL — ABNORMAL HIGH (ref 6–23)
CO2: 23 mEq/L (ref 19–32)
Calcium: 8.3 mg/dL — ABNORMAL LOW (ref 8.4–10.5)
Chloride: 104 mEq/L (ref 96–112)
Creatinine, Ser: 1.55 mg/dL — ABNORMAL HIGH (ref 0.50–1.35)
GFR calc Af Amer: 50 mL/min — ABNORMAL LOW (ref >90)
GFR calc non Af Amer: 43 mL/min — ABNORMAL LOW (ref >90)
Glucose, Bld: 118 mg/dL — ABNORMAL HIGH (ref 70–99)
Potassium: 4.7 mEq/L (ref 3.7–5.3)
Sodium: 137 mEq/L (ref 137–147)

## 2014-03-03 MED ORDER — DEXTROSE 5 % IV SOLN
1.0000 g | INTRAVENOUS | Status: AC
  Administered 2014-03-03 – 2014-03-04 (×2): 1 g via INTRAVENOUS
  Filled 2014-03-03 (×2): qty 10

## 2014-03-03 MED ORDER — ENOXAPARIN SODIUM 30 MG/0.3ML ~~LOC~~ SOLN
30.0000 mg | SUBCUTANEOUS | Status: DC
  Administered 2014-03-03: 30 mg via SUBCUTANEOUS
  Filled 2014-03-03 (×3): qty 0.3

## 2014-03-03 MED ORDER — FUROSEMIDE 10 MG/ML IJ SOLN
40.0000 mg | Freq: Once | INTRAMUSCULAR | Status: AC
  Administered 2014-03-03: 40 mg via INTRAVENOUS
  Filled 2014-03-03: qty 4

## 2014-03-03 NOTE — Progress Notes (Signed)
Patient ID: [name], male   DOB: 12-21-1940, 73 y.o.   MRN: [id number] EVENING ROUNDS NOTE :     [address]       ,[place] [address]             [phone]                 2 Days Post-Op Procedure(s) (LRB): REDO CORONARY ARTERY BYPASS GRAFTING TIMES THREE, USING RIGHT GREATER SAPHENOUS VEIN VIA ENDOVEIN HARVEST. (N/A) INTRAOPERATIVE TRANSESOPHAGEAL ECHOCARDIOGRAM (N/A)  Total Length of Stay:  LOS: 6 days  BP 143/57  Pulse 105  Temp(Src) 98.3 F (36.8 C) (Oral)  Resp 23  Ht 5' 6.14" (1.68 m)  Wt 184 lb 1.4 oz (83.5 kg)  BMI 29.58 kg/m2  SpO2 99%  .Intake/Output     05/17 0701 - 05/18 0700   P.O. 655   I.V. (mL/kg) 320 (3.8)   IV Piggyback 50   Total Intake(mL/kg) 1025 (12.3)   Urine (mL/kg/hr) 1430 (1.4)   Chest Tube 20 (0)   Total Output 1450   Net -425         . sodium chloride 20 mL/hr at 03/02/14 1900  . sodium chloride 20 mL/hr at 03/02/14 1900  . sodium chloride    . dexmedetomidine Stopped (03/02/14 1000)  . lactated ringers Stopped (03/01/14 1415)  . nitroGLYCERIN Stopped (03/01/14 1400)  . phenylephrine (NEO-SYNEPHRINE) Adult infusion Stopped (03/02/14 1000)     Lab Results  Component Value Date   WBC 13.6* 03/03/2014   HGB 8.7* 03/03/2014   HCT 26.2* 03/03/2014   PLT 149* 03/03/2014   GLUCOSE 118* 03/03/2014   CHOL 136 03/03/2011   TRIG 134 03/03/2011   HDL 34* 03/03/2011   LDLCALC  Value: 75        Total Cholesterol/HDL:CHD Risk Coronary Heart Disease Risk Table                     Men   Women  1/2 Average Risk   3.4   3.3  Average Risk       5.0   4.4  2 X Average Risk   9.6   7.1  3 X Average Risk  23.4   11.0        Use the calculated Patient Ratio above and the CHD Risk Table to determine the patient's CHD Risk.        ATP III CLASSIFICATION (LDL):  <100     mg/dL   Optimal  100-129  mg/dL   Near or Above                    Optimal  130-159  mg/dL   Borderline  160-189  mg/dL   High  >190     mg/dL   Very High 03/03/2011   ALT  24 09/24/2013   AST 19 09/24/2013   NA 137 03/03/2014   K 4.7 03/03/2014   CL 104 03/03/2014   CREATININE 1.55* 03/03/2014   BUN 24* 03/03/2014   CO2 23 03/03/2014   TSH 2.750 02/26/2014   INR 1.47 03/01/2014   HGBA1C 7.4* 02/26/2014   Cr 1.55  Weak when getting up will need pt    Grace Isaac MD  Beeper [phone] Office [phone] 03/03/2014 7:16 PM

## 2014-03-03 NOTE — Progress Notes (Signed)
ANTIBIOTIC CONSULT NOTE - INITIAL  Pharmacy consulted to manage Rocephin in patient with UTI. He is to get two more doses of Rocephin to complete his treatment.  Plan: 1) Give Rocephin 1 gm IV Q 24 hours x 2 doses 2) Pharmacy will sign off since no further intervention required.   Albertina Parr, PharmD.  Clinical Pharmacist Pager [phone]

## 2014-03-03 NOTE — Progress Notes (Signed)
Patient ID: [name], male   DOB: [birth year], 73 y.o.   MRN: [id number] [facility]CTS DAILY ICU PROGRESS NOTE                   [address]            [place] [address]          [phone]   2 Days Post-Op Procedure(s) (LRB): REDO CORONARY ARTERY BYPASS GRAFTING TIMES THREE, USING RIGHT GREATER SAPHENOUS VEIN VIA ENDOVEIN HARVEST. (N/A) INTRAOPERATIVE TRANSESOPHAGEAL ECHOCARDIOGRAM (N/A)  Total Length of Stay:  LOS: 6 days   Subjective: feels better, good respiratory effort  Objective: Vital signs in last 24 hours: Temp:  [98 F (36.7 C)-100.8 F (38.2 C)] 98 F (36.7 C) (05/17 0723) Pulse Rate:  [80-119] 94 (05/17 0800) Cardiac Rhythm:  [-] Heart block (05/17 0745) Resp:  [8-24] 20 (05/17 0800) BP: (97-177)/(48-94) 125/94 mmHg (05/17 0800) SpO2:  [95 %-100 %] 99 % (05/17 0800) Arterial Line BP: (121-216)/(47-69) 169/57 mmHg (05/17 0700) FiO2 (%):  [40 %] 40 % (05/16 0840) Weight:  [184 lb 1.4 oz (83.5 kg)] 184 lb 1.4 oz (83.5 kg) (05/17 0500)  Filed Weights   03/01/14 1400 03/02/14 0419 03/03/14 0500  Weight: 178 lb 9.2 oz (81 kg) 192 lb 7.4 oz (87.3 kg) 184 lb 1.4 oz (83.5 kg)    Weight change: 5 lb 8.2 oz (2.5 kg)   Hemodynamic parameters for last 24 hours: PAP: (29-36)/(17-25) 32/17 mmHg  Intake/Output from previous day: 05/16 0701 - 05/17 0700 In: 1488.4 [P.O.:390; I.V.:998.4; IV Piggyback:100] Out: 1975 [Urine:1825; Chest Tube:150]  Intake/Output this shift: Total I/O In: 40 [I.V.:40] Out: 220 [Urine:220]  Current Meds: Scheduled Meds: . acetaminophen  1,000 mg Oral 4 times per day   Or  . acetaminophen (TYLENOL) oral liquid 160 mg/5 mL  1,000 mg Per Tube 4 times per day  . aspirin EC  325 mg Oral Daily   Or  . aspirin  324 mg Per Tube Daily  . atorvastatin  20 mg Oral Daily  . bisacodyl  10 mg Oral Daily   Or  . bisacodyl  10 mg Rectal Daily  . docusate sodium  200 mg Oral Daily  . insulin aspart  0-24 Units Subcutaneous 6  times per day  . insulin detemir  15 Units Subcutaneous Daily  . levalbuterol  0.63 mg Nebulization Q4H  . metoprolol tartrate  12.5 mg Oral BID   Or  . metoprolol tartrate  12.5 mg Per Tube BID  . pantoprazole  40 mg Oral Daily  . sodium chloride  3 mL Intravenous Q12H   Continuous Infusions: . sodium chloride 20 mL/hr at 03/02/14 1900  . sodium chloride 20 mL/hr at 03/02/14 1900  . sodium chloride    . dexmedetomidine Stopped (03/02/14 1000)  . lactated ringers Stopped (03/01/14 1415)  . nitroGLYCERIN Stopped (03/01/14 1400)  . phenylephrine (NEO-SYNEPHRINE) Adult infusion Stopped (03/02/14 1000)   PRN Meds:.metoprolol, midazolam, morphine injection, ondansetron (ZOFRAN) IV, oxyCODONE, sodium chloride  General appearance: alert, cooperative and no distress Neurologic: intact Heart: regular rate and rhythm, S1, S2 normal, no murmur, click, rub or gallop Lungs: diminished breath sounds bibasilar Abdomen: soft, non-tender; bowel sounds normal; no masses,  no organomegaly Extremities: extremities normal, atraumatic, no cyanosis or edema and Homans sign is negative, no sign of DVT Wound: dressing intact  Lab Results: CBC: Recent Labs  03/02/14 1705 03/02/14 1709 03/03/14 0327  WBC 14.0*  --  13.6*  HGB 9.6* 9.2* 8.7*  HCT 28.3* 27.0* 26.2*  PLT 139*  --  149*   BMET:  Recent Labs  03/02/14 0503  03/02/14 1709 03/03/14 0327  NA 138  --  138 137  K 5.1  --  4.9 4.7  CL 106  --  105 104  CO2 21  --   --  23  GLUCOSE 103*  --  139* 118*  BUN 22  --  20 24*  CREATININE 1.72*  < > 1.70* 1.55*  CALCIUM 7.9*  --   --  8.3*  < > = values in this interval not displayed.  PT/INR:  Recent Labs  03/01/14 1400  LABPROT 17.4*  INR 1.47   Radiology: Dg Chest Port 1 View  03/03/2014   CLINICAL DATA:  Postoperative evaluation.  Chest tube.  EXAM: PORTABLE CHEST - 1 VIEW  COMPARISON:  Yesterday.  FINDINGS: The cardiac silhouette remains borderline enlarged. Stable post CABG  changes and right jugular catheter. The right jugular Swan-Ganz catheter has been removed and the sheath remains in place. Stable left chest tube. No pneumothorax. The lungs are clear with stable mildly prominent interstitial markings. Cervical spine fixation hardware. The endotracheal tube and orogastric tube have been removed.  IMPRESSION: No acute abnormality. Stable mild chronic interstitial lung disease.   Electronically Signed   By: Enrique Sack M.D.   On: 03/03/2014 06:32   Dg Chest Portable 1 View In Am  03/02/2014   CLINICAL DATA:  Postop  EXAM: PORTABLE CHEST - 1 VIEW  COMPARISON:  Chest x-ray from yesterday  FINDINGS: Stable positioning of endotracheal tube, ending in the mid thoracic trachea. An orogastric tube tip ends in the upper stomach, with side port at the expected location of the GE junction. Swan-Ganz catheter via right IJ approach, tip in the region of the main pulmonary artery. Thoracic drains in stable position. No evidence of ongoing air leak. Normal heart size. Pulmonary edema has improved. Haziness of the lower chest compatible with postoperative atelectasis and small pleural effusions.  IMPRESSION: 1. Stable positioning of tubes and lines. Note the orogastric tube side port is at the GE junction. Advancement by 5 cm may improve function. 2. Improving pulmonary edema.   Electronically Signed   By: Jorje Guild M.D.   On: 03/02/2014 07:08   Dg Chest Portable 1 View  03/01/2014   CLINICAL DATA:  Postop coronary bypass  EXAM: PORTABLE CHEST - 1 VIEW  COMPARISON:  02/28/2014  FINDINGS: Endotracheal tube 3.8 cm above the carina. Swan-Ganz catheter in the central pulmonary outflow tract. Mediastinal drain and left chest tube noted. NG tube extends below the hemidiaphragm into the stomach. Normal heart size. Central vascular congestion/mild edema pattern noted. No large effusion or pneumothorax. Lower cervical fusion hardware evident.  IMPRESSION: Support apparatus in good position.  Mild  central hilar vascular congestion versus edema.  No large effusion or pneumothorax   Electronically Signed   By: Daryll Brod M.D.   On: 03/01/2014 15:16     Assessment/Plan: S/P Procedure(s) (LRB): REDO CORONARY ARTERY BYPASS GRAFTING TIMES THREE, USING RIGHT GREATER SAPHENOUS VEIN VIA ENDOVEIN HARVEST. 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

## 2014-03-04 ENCOUNTER — Inpatient Hospital Stay (HOSPITAL_COMMUNITY): Payer: Medicare Other

## 2014-03-04 DIAGNOSIS — Z951 Presence of aortocoronary bypass graft: Secondary | ICD-10-CM

## 2014-03-04 DIAGNOSIS — J449 Chronic obstructive pulmonary disease, unspecified: Secondary | ICD-10-CM

## 2014-03-04 DIAGNOSIS — I5042 Chronic combined systolic (congestive) and diastolic (congestive) heart failure: Secondary | ICD-10-CM

## 2014-03-04 DIAGNOSIS — I10 Essential (primary) hypertension: Secondary | ICD-10-CM

## 2014-03-04 DIAGNOSIS — I70219 Atherosclerosis of native arteries of extremities with intermittent claudication, unspecified extremity: Secondary | ICD-10-CM

## 2014-03-04 LAB — TYPE AND SCREEN
ABO/RH(D): O POS
Antibody Screen: NEGATIVE
Unit division: 0
Unit division: 0
Unit division: 0
Unit division: 0
Unit division: 0
Unit division: 0

## 2014-03-04 LAB — CBC
HCT: 25.6 % — ABNORMAL LOW (ref 39.0–52.0)
Hemoglobin: 8.5 g/dL — ABNORMAL LOW (ref 13.0–17.0)
MCH: 27.1 pg (ref 26.0–34.0)
MCHC: 33.2 g/dL (ref 30.0–36.0)
MCV: 81.5 fL (ref 78.0–100.0)
Platelets: 176 10*3/uL (ref 150–400)
RBC: 3.14 MIL/uL — ABNORMAL LOW (ref 4.22–5.81)
RDW: 15.2 % (ref 11.5–15.5)
WBC: 11.7 10*3/uL — ABNORMAL HIGH (ref 4.0–10.5)

## 2014-03-04 LAB — BASIC METABOLIC PANEL
BUN: 28 mg/dL — ABNORMAL HIGH (ref 6–23)
CO2: 26 mEq/L (ref 19–32)
Calcium: 8.6 mg/dL (ref 8.4–10.5)
Chloride: 102 mEq/L (ref 96–112)
Creatinine, Ser: 1.33 mg/dL (ref 0.50–1.35)
GFR calc Af Amer: 60 mL/min — ABNORMAL LOW (ref >90)
GFR calc non Af Amer: 52 mL/min — ABNORMAL LOW (ref >90)
Glucose, Bld: 96 mg/dL (ref 70–99)
Potassium: 4.2 mEq/L (ref 3.7–5.3)
Sodium: 138 mEq/L (ref 137–147)

## 2014-03-04 LAB — GLUCOSE, CAPILLARY
GLUCOSE-CAPILLARY: 200 mg/dL — AB (ref 70–99)
GLUCOSE-CAPILLARY: 170 mg/dL — AB (ref 70–99)
Glucose-Capillary: 100 mg/dL — ABNORMAL HIGH (ref 70–99)
Glucose-Capillary: 108 mg/dL — ABNORMAL HIGH (ref 70–99)
Glucose-Capillary: 223 mg/dL — ABNORMAL HIGH (ref 70–99)
Glucose-Capillary: 96 mg/dL (ref 70–99)

## 2014-03-04 MED ORDER — MOVING RIGHT ALONG BOOK
Freq: Once | Status: AC
  Administered 2014-03-04: 20:00:00
  Filled 2014-03-04: qty 1

## 2014-03-04 MED ORDER — METOPROLOL TARTRATE 25 MG PO TABS
25.0000 mg | ORAL_TABLET | Freq: Two times a day (BID) | ORAL | Status: DC
  Administered 2014-03-04 – 2014-03-06 (×5): 25 mg via ORAL
  Filled 2014-03-04 (×7): qty 1

## 2014-03-04 MED ORDER — POTASSIUM CHLORIDE CRYS ER 20 MEQ PO TBCR
20.0000 meq | EXTENDED_RELEASE_TABLET | Freq: Every day | ORAL | Status: DC
  Administered 2014-03-04 – 2014-03-07 (×3): 20 meq via ORAL
  Filled 2014-03-04 (×4): qty 1

## 2014-03-04 MED ORDER — SODIUM CHLORIDE 0.9 % IJ SOLN
3.0000 mL | Freq: Two times a day (BID) | INTRAMUSCULAR | Status: DC
Start: 2014-03-04 — End: 2014-03-07
  Administered 2014-03-04 – 2014-03-06 (×4): 3 mL via INTRAVENOUS

## 2014-03-04 MED ORDER — OXYCODONE HCL 5 MG PO TABS: 5.0000 mg | ORAL_TABLET | ORAL | Status: DC | PRN

## 2014-03-04 MED ORDER — SODIUM CHLORIDE 0.9 % IJ SOLN: 10.0000 mL | INTRAMUSCULAR | Status: DC | PRN

## 2014-03-04 MED ORDER — BISACODYL 10 MG RE SUPP: 10.0000 mg | Freq: Every day | RECTAL | Status: DC | PRN

## 2014-03-04 MED ORDER — PANTOPRAZOLE SODIUM 40 MG PO TBEC
40.0000 mg | DELAYED_RELEASE_TABLET | Freq: Every day | ORAL | Status: DC
  Administered 2014-03-05 – 2014-03-07 (×3): 40 mg via ORAL
  Filled 2014-03-04 (×3): qty 1

## 2014-03-04 MED ORDER — METOPROLOL TARTRATE 25 MG/10 ML ORAL SUSPENSION
25.0000 mg | Freq: Two times a day (BID) | ORAL | Status: DC
  Filled 2014-03-04 (×2): qty 10

## 2014-03-04 MED ORDER — SODIUM CHLORIDE 0.9 % IJ SOLN: 3.0000 mL | INTRAMUSCULAR | Status: DC | PRN

## 2014-03-04 MED ORDER — ONDANSETRON HCL 4 MG PO TABS: 4.0000 mg | ORAL_TABLET | Freq: Four times a day (QID) | ORAL | Status: DC | PRN

## 2014-03-04 MED ORDER — DOCUSATE SODIUM 100 MG PO CAPS
200.0000 mg | ORAL_CAPSULE | Freq: Every day | ORAL | Status: DC
  Administered 2014-03-04 – 2014-03-07 (×4): 200 mg via ORAL
  Filled 2014-03-04 (×4): qty 2

## 2014-03-04 MED ORDER — ALBUTEROL SULFATE (2.5 MG/3ML) 0.083% IN NEBU: 2.5000 mg | INHALATION_SOLUTION | RESPIRATORY_TRACT | Status: DC | PRN

## 2014-03-04 MED ORDER — SODIUM CHLORIDE 0.9 % IJ SOLN
10.0000 mL | Freq: Two times a day (BID) | INTRAMUSCULAR | Status: DC
  Administered 2014-03-05: 10 mL

## 2014-03-04 MED ORDER — TRAMADOL HCL 50 MG PO TABS
50.0000 mg | ORAL_TABLET | ORAL | Status: DC | PRN
  Administered 2014-03-05 – 2014-03-06 (×2): 100 mg via ORAL
  Filled 2014-03-04 (×2): qty 2

## 2014-03-04 MED ORDER — ACETAMINOPHEN 325 MG PO TABS
650.0000 mg | ORAL_TABLET | Freq: Four times a day (QID) | ORAL | Status: DC | PRN
  Administered 2014-03-07: 650 mg via ORAL
  Filled 2014-03-04: qty 2

## 2014-03-04 MED ORDER — INSULIN ASPART 100 UNIT/ML ~~LOC~~ SOLN
0.0000 [IU] | Freq: Three times a day (TID) | SUBCUTANEOUS | Status: DC
  Administered 2014-03-04 – 2014-03-05 (×3): 4 [IU] via SUBCUTANEOUS
  Administered 2014-03-05 – 2014-03-07 (×4): 2 [IU] via SUBCUTANEOUS

## 2014-03-04 MED ORDER — BISACODYL 5 MG PO TBEC
10.0000 mg | DELAYED_RELEASE_TABLET | Freq: Every day | ORAL | Status: DC | PRN
  Administered 2014-03-05: 10 mg via ORAL
  Filled 2014-03-04: qty 2

## 2014-03-04 MED ORDER — GLIPIZIDE ER 5 MG PO TB24
5.0000 mg | ORAL_TABLET | Freq: Every day | ORAL | Status: DC
  Administered 2014-03-05 – 2014-03-07 (×3): 5 mg via ORAL
  Filled 2014-03-04 (×5): qty 1

## 2014-03-04 MED ORDER — FUROSEMIDE 40 MG PO TABS
40.0000 mg | ORAL_TABLET | Freq: Every day | ORAL | Status: DC
  Administered 2014-03-04 – 2014-03-07 (×4): 40 mg via ORAL
  Filled 2014-03-04 (×4): qty 1

## 2014-03-04 MED ORDER — ENOXAPARIN SODIUM 40 MG/0.4ML ~~LOC~~ SOLN
40.0000 mg | SUBCUTANEOUS | Status: DC
  Administered 2014-03-04 – 2014-03-06 (×3): 40 mg via SUBCUTANEOUS
  Filled 2014-03-04 (×4): qty 0.4

## 2014-03-04 MED ORDER — ASPIRIN EC 325 MG PO TBEC
325.0000 mg | DELAYED_RELEASE_TABLET | Freq: Every day | ORAL | Status: DC
  Administered 2014-03-05 – 2014-03-07 (×3): 325 mg via ORAL
  Filled 2014-03-04 (×3): qty 1

## 2014-03-04 MED ORDER — SODIUM CHLORIDE 0.9 % IV SOLN: 250.0000 mL | INTRAVENOUS | Status: DC | PRN

## 2014-03-04 MED ORDER — ONDANSETRON HCL 4 MG/2ML IJ SOLN: 4.0000 mg | Freq: Four times a day (QID) | INTRAMUSCULAR | Status: DC | PRN

## 2014-03-04 MED ORDER — METOPROLOL TARTRATE 25 MG PO TABS
25.0000 mg | ORAL_TABLET | Freq: Two times a day (BID) | ORAL | Status: DC
  Administered 2014-03-04: 25 mg via ORAL
  Filled 2014-03-04 (×2): qty 1

## 2014-03-04 MED FILL — Sodium Chloride IV Soln 0.9%: INTRAVENOUS | Qty: 2000 | Status: AC

## 2014-03-04 MED FILL — Thrombin For Soln 20000 Unit: CUTANEOUS | Qty: 1 | Status: AC

## 2014-03-04 MED FILL — Potassium Chloride Inj 2 mEq/ML: INTRAVENOUS | Qty: 40 | Status: AC

## 2014-03-04 MED FILL — Sodium Bicarbonate IV Soln 8.4%: INTRAVENOUS | Qty: 50 | Status: AC

## 2014-03-04 MED FILL — Heparin Sodium (Porcine) Inj 1000 Unit/ML: INTRAMUSCULAR | Qty: 30 | Status: AC

## 2014-03-04 MED FILL — Electrolyte-R (PH 7.4) Solution: INTRAVENOUS | Qty: 3000 | Status: AC

## 2014-03-04 MED FILL — Lidocaine HCl IV Inj 20 MG/ML: INTRAVENOUS | Qty: 5 | Status: AC

## 2014-03-04 MED FILL — Magnesium Sulfate Inj 50%: INTRAMUSCULAR | Qty: 10 | Status: AC

## 2014-03-04 MED FILL — Heparin Sodium (Porcine) Inj 1000 Unit/ML: INTRAMUSCULAR | Qty: 20 | Status: AC

## 2014-03-04 MED FILL — Mannitol IV Soln 20%: INTRAVENOUS | Qty: 500 | Status: AC

## 2014-03-04 NOTE — Progress Notes (Signed)
CT surgery PM rounds  Patient is sitting up eating supper-sinus rhythm Cough slightly wet We'll arrange for flutter valve Transfer to step down is imminent

## 2014-03-04 NOTE — Progress Notes (Addendum)
3 Days Post-Op Procedure(s) (LRB): REDO CORONARY ARTERY BYPASS GRAFTING TIMES THREE, USING RIGHT GREATER SAPHENOUS VEIN VIA ENDOVEIN HARVEST. (N/A) INTRAOPERATIVE TRANSESOPHAGEAL ECHOCARDIOGRAM (N/A) Subjective:  No complaints  Weak and difficult to get up to chair according to nurses  Passing flatus, no BM  Objective: Vital signs in last 24 hours: Temp:  [97.8 F (36.6 C)-98.3 F (36.8 C)] 98.1 F (36.7 C) (05/18 0736) Pulse Rate:  [79-105] 89 (05/18 0700) Cardiac Rhythm:  [-] Heart block (05/18 0400) Resp:  [12-25] 17 (05/18 0700) BP: (123-173)/(46-94) 150/58 mmHg (05/18 0700) SpO2:  [97 %-100 %] 99 % (05/18 0700) Weight:  [82.6 kg (182 lb 1.6 oz)] 82.6 kg (182 lb 1.6 oz) (05/18 0600)  Hemodynamic parameters for last 24 hours:    Intake/Output from previous day: 05/17 0701 - 05/18 0700 In: 1125 [P.O.:655; I.V.:420; IV Piggyback:50] Out: 1980 [EXBMW:4132; Chest Tube:20] Intake/Output this shift:    General appearance: alert and cooperative Neurologic: intact Heart: regular rate and rhythm, S1, S2 normal, no murmur, click, rub or gallop Lungs: rhonchi right lung Abdomen: bowel sounds present, nontender. Extremities: edema mild Wound: dressing dry  Lab Results:  Recent Labs  03/03/14 0327 03/04/14 0430  WBC 13.6* 11.7*  HGB 8.7* 8.5*  HCT 26.2* 25.6*  PLT 149* 176   BMET:  Recent Labs  03/03/14 0327 03/04/14 0430  NA 137 138  K 4.7 4.2  CL 104 102  CO2 23 26  GLUCOSE 118* 96  BUN 24* 28*  CREATININE 1.55* 1.33  CALCIUM 8.3* 8.6    PT/INR:  Recent Labs  03/01/14 1400  LABPROT 17.4*  INR 1.47   ABG    Component Value Date/Time   PHART 7.342* 03/02/2014 1214   HCO3 21.5 03/02/2014 1214   TCO2 20 03/02/2014 1709   ACIDBASEDEF 4.0* 03/02/2014 1214   O2SAT 88.0 03/02/2014 1214   CBG (last 3)   Recent Labs  03/03/14 1930 03/03/14 2358 03/04/14 0405  GLUCAP 203* 96 100*   CXR: clear  Assessment/Plan: S/P Procedure(s) (LRB): REDO  CORONARY ARTERY BYPASS GRAFTING TIMES THREE, USING RIGHT GREATER SAPHENOUS VEIN VIA ENDOVEIN HARVEST. (N/A) INTRAOPERATIVE TRANSESOPHAGEAL ECHOCARDIOGRAM (N/A) Hemodynamically stable. Increase Lopressor. Mobilize Diuresis Continue foley due to bladder dysfunction and he straight caths himself at home. I think his risk for infection is higher in the hospital doing this. Will leave catheter in for now. He is debilitated from his acute illness and bedrest preop. PT has been consulted. Will keep in ICU until he can be mobilized. Expected acute blood loss anemia: stable Diabetes: glucose under good control. Continue Levemir and SSI for now. Transition to oral agents as he eats better and mobilizes. Preop acute on chronic renal failure: Improved. Creatinine is lower than preop.   LOS: 7 days    Gaye Pollack 03/04/2014

## 2014-03-04 NOTE — Progress Notes (Signed)
Subjective:  Looks great POD#3 re-do CABG X 3 by Dr. Buckner [place].  Objective:  Temp:  [97.8 F (36.6 C)-98.3 F (36.8 C)] 98.1 F (36.7 C) (05/18 0736) Pulse Rate:  [79-105] 96 (05/18 0800) Resp:  [12-25] 20 (05/18 0800) BP: (123-173)/(46-66) 149/58 mmHg (05/18 0800) SpO2:  [97 %-100 %] 98 % (05/18 0836) Weight:  [182 lb 1.6 oz (82.6 kg)] 182 lb 1.6 oz (82.6 kg) (05/18 0600) Weight change: -1 lb 15.8 oz (-0.9 kg)  Intake/Output from previous day: 05/17 0701 - 05/18 0700 In: 1125 [P.O.:655; I.V.:420; IV Piggyback:50] Out: 1980 [RKYHC:6237; Chest Tube:20]  Intake/Output from this shift: Total I/O In: 120 [P.O.:120] Out: 75 [Urine:75]  Physical Exam: General appearance: alert and no distress Neck: no adenopathy, no carotid bruit, no JVD, supple, symmetrical, trachea midline and thyroid not enlarged, symmetric, no tenderness/mass/nodules Lungs: clear to auscultation bilaterally Heart: regular rate and rhythm, S1, S2 normal, no murmur, click, rub or gallop Extremities: extremities normal, atraumatic, no cyanosis or edema  Lab Results: Results for orders placed during the hospital encounter of 02/25/14 (from the past 48 hour(s))  GLUCOSE, CAPILLARY     Status: Abnormal   Collection Time    03/02/14 10:01 AM      Result Value Ref Range   Glucose-Capillary 109 (*) 70 - 99 mg/dL  POCT I-STAT 3, ART BLOOD GAS (G3+)     Status: Abnormal   Collection Time    03/02/14 10:28 AM      Result Value Ref Range   pH, Arterial 7.374  7.350 - 7.450   pCO2 arterial 37.9  35.0 - 45.0 mmHg   pO2, Arterial 69.0 (*) 80.0 - 100.0 mmHg   Bicarbonate 21.9  20.0 - 24.0 mEq/L   TCO2 23  0 - 100 mmol/L   O2 Saturation 92.0     Acid-base deficit 3.0 (*) 0.0 - 2.0 mmol/L   Patient temperature 38.0 C     Collection site ARTERIAL LINE     Drawn by Operator     Sample type ARTERIAL    GLUCOSE, CAPILLARY     Status: Abnormal   Collection Time    03/02/14 11:00 AM      Result Value Ref Range   Glucose-Capillary 115 (*) 70 - 99 mg/dL  GLUCOSE, CAPILLARY     Status: Abnormal   Collection Time    03/02/14 12:12 PM      Result Value Ref Range   Glucose-Capillary 121 (*) 70 - 99 mg/dL  POCT I-STAT 3, ART BLOOD GAS (G3+)     Status: Abnormal   Collection Time    03/02/14 12:14 PM      Result Value Ref Range   pH, Arterial 7.342 (*) 7.350 - 7.450   pCO2 arterial 40.2  35.0 - 45.0 mmHg   pO2, Arterial 61.0 (*) 80.0 - 100.0 mmHg   Bicarbonate 21.5  20.0 - 24.0 mEq/L   TCO2 23  0 - 100 mmol/L   O2 Saturation 88.0     Acid-base deficit 4.0 (*) 0.0 - 2.0 mmol/L   Patient temperature 38.1 C     Collection site ARTERIAL LINE     Drawn by Operator     Sample type ARTERIAL    GLUCOSE, CAPILLARY     Status: Abnormal   Collection Time    03/02/14  1:26 PM      Result Value Ref Range   Glucose-Capillary 121 (*) 70 - 99 mg/dL  GLUCOSE, CAPILLARY  Status: Abnormal   Collection Time    03/02/14  3:20 PM      Result Value Ref Range   Glucose-Capillary 111 (*) 70 - 99 mg/dL  GLUCOSE, CAPILLARY     Status: Abnormal   Collection Time    03/02/14  3:30 PM      Result Value Ref Range   Glucose-Capillary 100 (*) 70 - 99 mg/dL  MAGNESIUM     Status: None   Collection Time    03/02/14  5:05 PM      Result Value Ref Range   Magnesium 2.4  1.5 - 2.5 mg/dL  CBC     Status: Abnormal   Collection Time    03/02/14  5:05 PM      Result Value Ref Range   WBC 14.0 (*) 4.0 - 10.5 K/uL   RBC 3.49 (*) 4.22 - 5.81 MIL/uL   Hemoglobin 9.6 (*) 13.0 - 17.0 g/dL   HCT 28.3 (*) 39.0 - 52.0 %   MCV 81.1  78.0 - 100.0 fL   MCH 27.5  26.0 - 34.0 pg   MCHC 33.9  30.0 - 36.0 g/dL   RDW 15.4  11.5 - 15.5 %   Platelets 139 (*) 150 - 400 K/uL  CREATININE, SERUM     Status: Abnormal   Collection Time    03/02/14  5:05 PM      Result Value Ref Range   Creatinine, Ser 1.72 (*) 0.50 - 1.35 mg/dL   GFR calc non Af Amer 38 (*) >90 mL/min   GFR calc Af Amer 44 (*) >90 mL/min   Comment: (NOTE)     The  eGFR has been calculated using the CKD EPI equation.     This calculation has not been validated in all clinical situations.     eGFR's persistently <90 mL/min signify possible Chronic Kidney     Disease.  POCT I-STAT, CHEM 8     Status: Abnormal   Collection Time    03/02/14  5:09 PM      Result Value Ref Range   Sodium 138  137 - 147 mEq/L   Potassium 4.9  3.7 - 5.3 mEq/L   Chloride 105  96 - 112 mEq/L   BUN 20  6 - 23 mg/dL   Creatinine, Ser 1.70 (*) 0.50 - 1.35 mg/dL   Glucose, Bld 139 (*) 70 - 99 mg/dL   Calcium, Ion 1.20  1.13 - 1.30 mmol/L   TCO2 20  0 - 100 mmol/L   Hemoglobin 9.2 (*) 13.0 - 17.0 g/dL   HCT 27.0 (*) 39.0 - 52.0 %  GLUCOSE, CAPILLARY     Status: Abnormal   Collection Time    03/02/14  7:34 PM      Result Value Ref Range   Glucose-Capillary 222 (*) 70 - 99 mg/dL   Comment 1 Documented in Chart     Comment 2 Notify RN    GLUCOSE, CAPILLARY     Status: Abnormal   Collection Time    03/03/14 12:05 AM      Result Value Ref Range   Glucose-Capillary 146 (*) 70 - 99 mg/dL  BASIC METABOLIC PANEL     Status: Abnormal   Collection Time    03/03/14  3:27 AM      Result Value Ref Range   Sodium 137  137 - 147 mEq/L   Potassium 4.7  3.7 - 5.3 mEq/L   Chloride 104  96 - 112 mEq/L  CO2 23  19 - 32 mEq/L   Glucose, Bld 118 (*) 70 - 99 mg/dL   BUN 24 (*) 6 - 23 mg/dL   Creatinine, Ser 1.55 (*) 0.50 - 1.35 mg/dL   Calcium 8.3 (*) 8.4 - 10.5 mg/dL   GFR calc non Af Amer 43 (*) >90 mL/min   GFR calc Af Amer 50 (*) >90 mL/min   Comment: (NOTE)     The eGFR has been calculated using the CKD EPI equation.     This calculation has not been validated in all clinical situations.     eGFR's persistently <90 mL/min signify possible Chronic Kidney     Disease.  CBC     Status: Abnormal   Collection Time    03/03/14  3:27 AM      Result Value Ref Range   WBC 13.6 (*) 4.0 - 10.5 K/uL   RBC 3.22 (*) 4.22 - 5.81 MIL/uL   Hemoglobin 8.7 (*) 13.0 - 17.0 g/dL   HCT 26.2  (*) 39.0 - 52.0 %   MCV 81.4  78.0 - 100.0 fL   MCH 27.0  26.0 - 34.0 pg   MCHC 33.2  30.0 - 36.0 g/dL   RDW 15.3  11.5 - 15.5 %   Platelets 149 (*) 150 - 400 K/uL  GLUCOSE, CAPILLARY     Status: Abnormal   Collection Time    03/03/14  4:17 AM      Result Value Ref Range   Glucose-Capillary 116 (*) 70 - 99 mg/dL   Comment 1 Documented in Chart     Comment 2 Notify RN    GLUCOSE, CAPILLARY     Status: Abnormal   Collection Time    03/03/14  7:21 AM      Result Value Ref Range   Glucose-Capillary 139 (*) 70 - 99 mg/dL   Comment 1 Notify RN    GLUCOSE, CAPILLARY     Status: Abnormal   Collection Time    03/03/14 11:30 AM      Result Value Ref Range   Glucose-Capillary 158 (*) 70 - 99 mg/dL   Comment 1 Notify RN    GLUCOSE, CAPILLARY     Status: Abnormal   Collection Time    03/03/14  3:29 PM      Result Value Ref Range   Glucose-Capillary 149 (*) 70 - 99 mg/dL   Comment 1 Notify RN    GLUCOSE, CAPILLARY     Status: Abnormal   Collection Time    03/03/14  7:30 PM      Result Value Ref Range   Glucose-Capillary 203 (*) 70 - 99 mg/dL   Comment 1 Documented in Chart     Comment 2 Notify RN    GLUCOSE, CAPILLARY     Status: None   Collection Time    03/03/14 11:58 PM      Result Value Ref Range   Glucose-Capillary 96  70 - 99 mg/dL   Comment 1 Documented in Chart     Comment 2 Notify RN    GLUCOSE, CAPILLARY     Status: Abnormal   Collection Time    03/04/14  4:05 AM      Result Value Ref Range   Glucose-Capillary 100 (*) 70 - 99 mg/dL   Comment 1 Documented in Chart     Comment 2 Notify RN    BASIC METABOLIC PANEL     Status: Abnormal   Collection Time    03/04/14  4:30 AM      Result Value Ref Range   Sodium 138  137 - 147 mEq/L   Potassium 4.2  3.7 - 5.3 mEq/L   Chloride 102  96 - 112 mEq/L   CO2 26  19 - 32 mEq/L   Glucose, Bld 96  70 - 99 mg/dL   BUN 28 (*) 6 - 23 mg/dL   Creatinine, Ser 1.33  0.50 - 1.35 mg/dL   Calcium 8.6  8.4 - 10.5 mg/dL   GFR calc  non Af Amer 52 (*) >90 mL/min   GFR calc Af Amer 60 (*) >90 mL/min   Comment: (NOTE)     The eGFR has been calculated using the CKD EPI equation.     This calculation has not been validated in all clinical situations.     eGFR's persistently <90 mL/min signify possible Chronic Kidney     Disease.  CBC     Status: Abnormal   Collection Time    03/04/14  4:30 AM      Result Value Ref Range   WBC 11.7 (*) 4.0 - 10.5 K/uL   RBC 3.14 (*) 4.22 - 5.81 MIL/uL   Hemoglobin 8.5 (*) 13.0 - 17.0 g/dL   HCT 25.6 (*) 39.0 - 52.0 %   MCV 81.5  78.0 - 100.0 fL   MCH 27.1  26.0 - 34.0 pg   MCHC 33.2  30.0 - 36.0 g/dL   RDW 15.2  11.5 - 15.5 %   Platelets 176  150 - 400 K/uL  GLUCOSE, CAPILLARY     Status: Abnormal   Collection Time    03/04/14  7:34 AM      Result Value Ref Range   Glucose-Capillary 108 (*) 70 - 99 mg/dL    Imaging: Imaging results have been reviewed  Assessment/Plan:   1. Principal Problem: 2.   Non-ST elevation myocardial infarction (NSTEMI), initial episode of care 3. Active Problems: 4.   Hypertension 5.   Diabetes mellitus with complication 6.   Hyperkalemia 7.   CKD (chronic kidney disease) stage 3, GFR 30-59 ml/min 8.   S/P CABG x 3 9.   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 Looks great. VSS. NSR. Labs OK. OOB in chair. On no drips. On approp meds. Transfer to tele per [facility]. Nl progression.   [name] 03/04/2014, 9:05 AM

## 2014-03-04 NOTE — Evaluation (Signed)
Physical Therapy Evaluation Patient Details Name: [name] MRN: [id number] DOB: [birth year] Today's Date: 03/04/2014   History of Present Illness  Pt admit for CABGx3.   Clinical Impression  Pt admitted with above. Pt currently with functional limitations due to the deficits listed below (see PT Problem List). Pt will benefit from skilled PT to increase their independence and safety with mobility to allow discharge to the venue listed below.     Follow Up Recommendations Home health PT;Supervision/Assistance - 24 hour    Equipment Recommendations  Rolling walker with 5" wheels    Recommendations for Other Services       Precautions / Restrictions Precautions Precautions: Fall;Sternal Restrictions Weight Bearing Restrictions: No      Mobility  Bed Mobility Overal bed mobility: Needs Assistance;+2 for physical assistance Bed Mobility: Supine to Sit;Rolling Rolling: Mod assist;+2 for physical assistance   Supine to sit: Mod assist;+2 for physical assistance     General bed mobility comments: Cues for rolling to follow sternal precautions with pt holding pillow and cues to limit weight on UE to push up.  Needed mod assist to complete movement.    Transfers Overall transfer level: Needs assistance Equipment used: Rolling walker (2 wheeled) Transfers: Sit to/from Stand Sit to Stand: +2 physical assistance;Min assist         General transfer comment: Pt help pillow and was able to power up fairly well using LEs with a little assist for stacbility upon initial stand.    Ambulation/Gait Ambulation/Gait assistance: Min assist;+2 safety/equipment Ambulation Distance (Feet): 220 Feet Assistive device:  (pushing wheelchair) Gait Pattern/deviations: Step-to pattern;Decreased stride length;Antalgic;Trunk flexed;Wide base of support;Drifts right/left   Gait velocity interpretation: Below normal speed for age/gender General Gait Details: Pt needed cues and assist to steer  wheelchair.  Pt needed  cues for postural stability as well.  Takes short steps.  Needs assit and cues for safety.  Stairs            Wheelchair Mobility    Modified Rankin (Stroke Patients Only)       Balance Overall balance assessment: Needs assistance;History of Falls Sitting-balance support: No upper extremity supported;Feet supported Sitting balance-Leahy Scale: Fair     Standing balance support: Bilateral upper extremity supported;During functional activity Standing balance-Leahy Scale: Poor Standing balance comment: Needs UEs for support.                             Pertinent Vitals/Pain VSS, No pain    Home Living Family/patient expects to be discharged to:: Private residence Living Arrangements: Spouse/significant other Available Help at Discharge: Family;Available 24 hours/day Type of Home: Other(Comment) (Town home) Home Access: Level entry     Home Layout: One level Home Equipment: Shower seat - built in;Cane - single point;Walker - standard (walk in shower, handicapped toilet)      Prior Function Level of Independence: Independent               Hand Dominance   Dominant Hand: Right    Extremity/Trunk Assessment   Upper Extremity Assessment: Defer to OT evaluation           Lower Extremity Assessment: Generalized weakness         Communication   Communication: No difficulties  Cognition Arousal/Alertness: Awake/alert Behavior During Therapy: WFL for tasks assessed/performed Overall Cognitive Status: Within Functional Limits for tasks assessed  General Comments      Exercises General Exercises - Lower Extremity Ankle Circles/Pumps: AROM;Both;10 reps;Supine Quad Sets: AROM;Both;10 reps;Supine Heel Slides: AROM;Both;10 reps;Supine      Assessment/Plan    PT Assessment Patient needs continued PT services  PT Diagnosis Generalized weakness   PT Problem List Decreased activity  tolerance;Decreased mobility;Decreased balance;Decreased knowledge of use of DME;Decreased safety awareness;Decreased knowledge of precautions  PT Treatment Interventions DME instruction;Gait training;Functional mobility training;Therapeutic activities;Therapeutic exercise;Balance training;Patient/family education   PT Goals (Current goals can be found in the Care Plan section) Acute Rehab PT Goals Patient Stated Goal: to go home PT Goal Formulation: With patient Time For Goal Achievement: 03/11/14 Potential to Achieve Goals: Good    Frequency Min 3X/week   Barriers to discharge        Co-evaluation               End of Session Equipment Utilized During Treatment: Gait belt Activity Tolerance: Patient limited by fatigue Patient left: in chair;with call bell/phone within reach Nurse Communication: Mobility status         Time: 9563-8756 PT Time Calculation (min): 22 min   Charges:   PT Evaluation $Initial PT Evaluation Tier I: 1 Procedure PT Treatments $Gait Training: 8-22 mins   PT G Codes:          Orrin Yurkovich Dorene Ar Apr 03, 2014, 4:00 PM [facility] Acute Rehabilitation [phone] (pager)

## 2014-03-05 ENCOUNTER — Encounter (HOSPITAL_COMMUNITY): Payer: Self-pay | Admitting: Surgery

## 2014-03-05 DIAGNOSIS — E785 Hyperlipidemia, unspecified: Secondary | ICD-10-CM

## 2014-03-05 LAB — GLUCOSE, CAPILLARY
GLUCOSE-CAPILLARY: 111 mg/dL — AB (ref 70–99)
GLUCOSE-CAPILLARY: 120 mg/dL — AB (ref 70–99)
GLUCOSE-CAPILLARY: 161 mg/dL — AB (ref 70–99)
Glucose-Capillary: 132 mg/dL — ABNORMAL HIGH (ref 70–99)
Glucose-Capillary: 97 mg/dL (ref 70–99)
Glucose-Capillary: 172 mg/dL — ABNORMAL HIGH (ref 70–99)

## 2014-03-05 LAB — BASIC METABOLIC PANEL
BUN: 29 mg/dL — ABNORMAL HIGH (ref 6–23)
CHLORIDE: 104 meq/L (ref 96–112)
CO2: 28 mEq/L (ref 19–32)
Calcium: 9 mg/dL (ref 8.4–10.5)
Creatinine, Ser: 1.45 mg/dL — ABNORMAL HIGH (ref 0.50–1.35)
GFR, EST AFRICAN AMERICAN: 54 mL/min — AB (ref >90)
GFR, EST NON AFRICAN AMERICAN: 47 mL/min — AB (ref >90)
Glucose, Bld: 130 mg/dL — ABNORMAL HIGH (ref 70–99)
POTASSIUM: 5.2 meq/L (ref 3.7–5.3)
SODIUM: 141 meq/L (ref 137–147)

## 2014-03-05 LAB — CBC
HEMATOCRIT: 26.1 % — AB (ref 39.0–52.0)
HEMOGLOBIN: 8.5 g/dL — AB (ref 13.0–17.0)
MCH: 27 pg (ref 26.0–34.0)
MCHC: 32.6 g/dL (ref 30.0–36.0)
MCV: 82.9 fL (ref 78.0–100.0)
Platelets: 224 10*3/uL (ref 150–400)
RBC: 3.15 MIL/uL — AB (ref 4.22–5.81)
RDW: 15.1 % (ref 11.5–15.5)
WBC: 9.8 10*3/uL (ref 4.0–10.5)

## 2014-03-05 MED ORDER — AMLODIPINE BESYLATE 5 MG PO TABS
5.0000 mg | ORAL_TABLET | Freq: Every day | ORAL | Status: DC
  Administered 2014-03-05: 5 mg via ORAL
  Filled 2014-03-05 (×2): qty 1

## 2014-03-05 MED ORDER — LACTULOSE 10 GM/15ML PO SOLN
10.0000 g | Freq: Once | ORAL | Status: AC
  Administered 2014-03-05: 10 g via ORAL
  Filled 2014-03-05: qty 15

## 2014-03-05 MED ORDER — INSULIN DETEMIR 100 UNIT/ML ~~LOC~~ SOLN
12.0000 [IU] | Freq: Every day | SUBCUTANEOUS | Status: DC
  Administered 2014-03-05 – 2014-03-06 (×2): 12 [IU] via SUBCUTANEOUS
  Filled 2014-03-05 (×4): qty 0.12

## 2014-03-05 MED ORDER — GUAIFENESIN ER 600 MG PO TB12
600.0000 mg | ORAL_TABLET | Freq: Two times a day (BID) | ORAL | Status: DC
  Administered 2014-03-05 – 2014-03-07 (×5): 600 mg via ORAL
  Filled 2014-03-05 (×6): qty 1

## 2014-03-05 MED ORDER — COLCHICINE 0.6 MG PO TABS
0.6000 mg | ORAL_TABLET | Freq: Two times a day (BID) | ORAL | Status: DC
  Administered 2014-03-05 – 2014-03-07 (×4): 0.6 mg via ORAL
  Filled 2014-03-05 (×5): qty 1

## 2014-03-05 NOTE — Progress Notes (Addendum)
[address]       [place] [address]             [phone]          4 Days Post-Op Procedure(s) (LRB): REDO CORONARY ARTERY BYPASS GRAFTING TIMES THREE, USING RIGHT GREATER SAPHENOUS VEIN VIA ENDOVEIN HARVEST. (N/A) INTRAOPERATIVE TRANSESOPHAGEAL ECHOCARDIOGRAM (N/A)  Subjective: Feels a little better today than yesterday. Still weak with no appetite, constipated, coughing but mostly nonproductive.   Objective: Vital signs in last 24 hours: Patient Vitals for the past 24 hrs:  BP Temp Temp src Pulse Resp SpO2 Weight  03/05/14 0353 161/58 mmHg 98.9 F (37.2 C) Oral 88 18 97 % 183 lb 6.8 oz (83.2 kg)  03/04/14 2153 - - - 91 16 96 % -  03/04/14 2005 - - - - - 96 % -  03/04/14 1955 166/63 mmHg 98.1 F (36.7 C) Oral 92 18 92 % 183 lb 6.8 oz (83.2 kg)  03/04/14 1930 140/51 mmHg - - - - 92 % -  03/04/14 1900 172/65 mmHg - - 92 21 91 % -  03/04/14 1800 178/71 mmHg - - 93 20 92 % -  03/04/14 1700 170/63 mmHg - - 84 21 91 % -  03/04/14 1600 110/74 mmHg - - 89 11 92 % -  03/04/14 1548 - 98.1 F (36.7 C) Oral - - - -  03/04/14 1546 - - - - - 92 % -  03/04/14 1500 - - - 89 17 92 % -  03/04/14 1400 156/56 mmHg - - 92 26 93 % -  03/04/14 1300 129/49 mmHg - - 82 19 91 % -  03/04/14 1200 140/49 mmHg - - 88 22 92 % -  03/04/14 1149 - 98.4 F (36.9 C) Oral - - - -  03/04/14 1100 160/63 mmHg - - 90 22 92 % -  03/04/14 1000 151/58 mmHg - - 94 22 92 % -  03/04/14 0900 177/60 mmHg - - 102 19 98 % -  03/04/14 0836 - - - - - 98 % -  03/04/14 0800 149/58 mmHg - - 96 20 99 % -   Current Weight  03/05/14 183 lb 6.8 oz (83.2 kg)     Intake/Output from previous day: 05/18 0701 - 05/19 0700 In: 410 [P.O.:360; IV Piggyback:50] Out: 1440 [Urine:1440]  CBGs 170-111-12-130    PHYSICAL EXAM:  Heart: RRR Lungs: Coarse rhonchi throughout Wound: Clean and dry Extremities: Mild LE edema   Lab Results: CBC: Recent Labs  03/04/14 0430 03/05/14 0435  WBC  11.7* 9.8  HGB 8.5* 8.5*  HCT 25.6* 26.1*  PLT 176 224   BMET:  Recent Labs  03/04/14 0430 03/05/14 0435  NA 138 141  K 4.2 5.2  CL 102 104  CO2 26 28  GLUCOSE 96 130*  BUN 28* 29*  CREATININE 1.33 1.45*  CALCIUM 8.6 9.0    PT/INR: No results found for this basename: LABPROT, INR,  in the last 72 hours    Assessment/Plan: S/P Procedure(s) (LRB): REDO CORONARY ARTERY BYPASS GRAFTING TIMES THREE, USING RIGHT GREATER SAPHENOUS VEIN VIA ENDOVEIN HARVEST. (N/A) INTRAOPERATIVE TRANSESOPHAGEAL ECHOCARDIOGRAM (N/A)  CV- SR, BPs trending up.  Continue Lopressor, will resume Norvasc for HTN. Not on ACE-I or ARB yet due to elevated Cr.  Vol overload- diurese.  DM- Continue Glipizide, wean Levemir.  Metformin not started due to elevated Cr.  Acute/chronic renal failure- mild increase in Cr this am. Continue  to watch.  GU- Foley left in place due to history of bladder dysfunction.  Deconditioning- Continue PT, CRPI.  Expected postop blood loss anemia- H/H stable.  Pulm- Continue aggressive pulm toilet, IS/FV, add Mucinex.   LOS: 8 days    Coolidge Breeze 03/05/2014   Chart reviewed, patient examined, agree with above. He is making slow progress but stable overall. Follow up creatinine tomorrow.  He has redness and soreness over the right MTP joint where he has a bunion. It looks like it could be gout but he has never had that before. Will start him on colchicine in case this is gout. It could just be local trauma over this larger bunion.

## 2014-03-05 NOTE — Progress Notes (Signed)
Patient had 2 large bowel movements after receiving lactulose. [name]

## 2014-03-05 NOTE — Progress Notes (Signed)
UR Completed.  Jazmina Muhlenkamp Jane Elinda Bunten [phone] 03/05/2014  

## 2014-03-05 NOTE — Progress Notes (Signed)
Subjective:  POD # 4 re do CABG, c/o chest wall pain  Objective:  Temp:  [98.1 F (36.7 C)-98.9 F (37.2 C)] 98.9 F (37.2 C) (05/19 0353) Pulse Rate:  [82-102] 88 (05/19 0353) Resp:  [11-26] 18 (05/19 0353) BP: (110-178)/(49-74) 161/58 mmHg (05/19 0353) SpO2:  [91 %-98 %] 97 % (05/19 0353) FiO2 (%):  [21 %] 21 % (05/18 2005) Weight:  [183 lb 6.8 oz (83.2 kg)] 183 lb 6.8 oz (83.2 kg) (05/19 0353) Weight change: 1 lb 5.2 oz (0.6 kg)  Intake/Output from previous day: 05/18 0701 - 05/19 0700 In: 410 [P.O.:360; IV Piggyback:50] Out: 1440 [Urine:1440]  Intake/Output from this shift:    Physical Exam: General appearance: alert and no distress Neck: no adenopathy, no carotid bruit, no JVD, supple, symmetrical, trachea midline and thyroid not enlarged, symmetric, no tenderness/mass/nodules Lungs: clear to auscultation bilaterally Heart: regular rate and rhythm, S1, S2 normal, no murmur, click, rub or gallop Extremities: extremities normal, atraumatic, no cyanosis or edema  Lab Results: Results for orders placed during the hospital encounter of 02/25/14 (from the past 48 hour(s))  GLUCOSE, CAPILLARY     Status: Abnormal   Collection Time    03/03/14 11:30 AM      Result Value Ref Range   Glucose-Capillary 158 (*) 70 - 99 mg/dL   Comment 1 Notify RN    GLUCOSE, CAPILLARY     Status: Abnormal   Collection Time    03/03/14  3:29 PM      Result Value Ref Range   Glucose-Capillary 149 (*) 70 - 99 mg/dL   Comment 1 Notify RN    GLUCOSE, CAPILLARY     Status: Abnormal   Collection Time    03/03/14  7:30 PM      Result Value Ref Range   Glucose-Capillary 203 (*) 70 - 99 mg/dL   Comment 1 Documented in Chart     Comment 2 Notify RN    GLUCOSE, CAPILLARY     Status: None   Collection Time    03/03/14 11:58 PM      Result Value Ref Range   Glucose-Capillary 96  70 - 99 mg/dL   Comment 1 Documented in Chart     Comment 2 Notify RN    GLUCOSE, CAPILLARY     Status:  Abnormal   Collection Time    03/04/14  4:05 AM      Result Value Ref Range   Glucose-Capillary 100 (*) 70 - 99 mg/dL   Comment 1 Documented in Chart     Comment 2 Notify RN    BASIC METABOLIC PANEL     Status: Abnormal   Collection Time    03/04/14  4:30 AM      Result Value Ref Range   Sodium 138  137 - 147 mEq/L   Potassium 4.2  3.7 - 5.3 mEq/L   Chloride 102  96 - 112 mEq/L   CO2 26  19 - 32 mEq/L   Glucose, Bld 96  70 - 99 mg/dL   BUN 28 (*) 6 - 23 mg/dL   Creatinine, Ser 1.33  0.50 - 1.35 mg/dL   Calcium 8.6  8.4 - 10.5 mg/dL   GFR calc non Af Amer 52 (*) >90 mL/min   GFR calc Af Amer 60 (*) >90 mL/min   Comment: (NOTE)     The eGFR has been calculated using the CKD EPI equation.     This calculation has not been  validated in all clinical situations.     eGFR's persistently <90 mL/min signify possible Chronic Kidney     Disease.  CBC     Status: Abnormal   Collection Time    03/04/14  4:30 AM      Result Value Ref Range   WBC 11.7 (*) 4.0 - 10.5 K/uL   RBC 3.14 (*) 4.22 - 5.81 MIL/uL   Hemoglobin 8.5 (*) 13.0 - 17.0 g/dL   HCT 25.6 (*) 39.0 - 52.0 %   MCV 81.5  78.0 - 100.0 fL   MCH 27.1  26.0 - 34.0 pg   MCHC 33.2  30.0 - 36.0 g/dL   RDW 15.2  11.5 - 15.5 %   Platelets 176  150 - 400 K/uL  GLUCOSE, CAPILLARY     Status: Abnormal   Collection Time    03/04/14  7:34 AM      Result Value Ref Range   Glucose-Capillary 108 (*) 70 - 99 mg/dL  GLUCOSE, CAPILLARY     Status: Abnormal   Collection Time    03/04/14 11:50 AM      Result Value Ref Range   Glucose-Capillary 200 (*) 70 - 99 mg/dL  GLUCOSE, CAPILLARY     Status: Abnormal   Collection Time    03/04/14  3:49 PM      Result Value Ref Range   Glucose-Capillary 223 (*) 70 - 99 mg/dL  GLUCOSE, CAPILLARY     Status: Abnormal   Collection Time    03/04/14  9:09 PM      Result Value Ref Range   Glucose-Capillary 170 (*) 70 - 99 mg/dL  GLUCOSE, CAPILLARY     Status: Abnormal   Collection Time    03/05/14  12:27 AM      Result Value Ref Range   Glucose-Capillary 111 (*) 70 - 99 mg/dL  GLUCOSE, CAPILLARY     Status: Abnormal   Collection Time    03/05/14  3:54 AM      Result Value Ref Range   Glucose-Capillary 120 (*) 70 - 99 mg/dL  BASIC METABOLIC PANEL     Status: Abnormal   Collection Time    03/05/14  4:35 AM      Result Value Ref Range   Sodium 141  137 - 147 mEq/L   Potassium 5.2  3.7 - 5.3 mEq/L   Comment: DELTA CHECK NOTED     NO VISIBLE HEMOLYSIS   Chloride 104  96 - 112 mEq/L   CO2 28  19 - 32 mEq/L   Glucose, Bld 130 (*) 70 - 99 mg/dL   BUN 29 (*) 6 - 23 mg/dL   Creatinine, Ser 1.45 (*) 0.50 - 1.35 mg/dL   Calcium 9.0  8.4 - 10.5 mg/dL   GFR calc non Af Amer 47 (*) >90 mL/min   GFR calc Af Amer 54 (*) >90 mL/min   Comment: (NOTE)     The eGFR has been calculated using the CKD EPI equation.     This calculation has not been validated in all clinical situations.     eGFR's persistently <90 mL/min signify possible Chronic Kidney     Disease.  CBC     Status: Abnormal   Collection Time    03/05/14  4:35 AM      Result Value Ref Range   WBC 9.8  4.0 - 10.5 K/uL   RBC 3.15 (*) 4.22 - 5.81 MIL/uL   Hemoglobin 8.5 (*) 13.0 - 17.0 g/dL  HCT 26.1 (*) 39.0 - 52.0 %   MCV 82.9  78.0 - 100.0 fL   MCH 27.0  26.0 - 34.0 pg   MCHC 32.6  30.0 - 36.0 g/dL   RDW 15.1  11.5 - 15.5 %   Platelets 224  150 - 400 K/uL    Imaging: Imaging results have been reviewed  Assessment/Plan:   1. Principal Problem: 2.   Non-ST elevation myocardial infarction (NSTEMI), initial episode of care 3. Active Problems: 4.   Hypertension 5.   Diabetes mellitus with complication 6.   Hyperkalemia 7.   CKD (chronic kidney disease) stage 3, GFR 30-59 ml/min 8.   S/P CABG x 3 9.   Time Spent Directly with Patient:  20 minutes  Length of Stay:  LOS: 8 days   POD # 4 re do CABG. Exam benign. NSR. Labs OK except for increased SCr (no ACE-I/ARB). Ambulating with CRH. Pulm toilet. Nl  progression. Follow up with Dr. Pernell Dupre after D/C home.  Lorretta Harp 03/05/2014, 8:32 AM

## 2014-03-05 NOTE — Progress Notes (Signed)
Pt does not wish to wear CPAP tonight.  Notified Pt to let us know if he changes his mind and an RT will come place him on it.  Pt currently stable on RA.  Sats 97%, BBS clr/dimin.

## 2014-03-05 NOTE — Progress Notes (Signed)
CARDIAC REHAB PHASE I   PRE:  Rate/Rhythm: 99SR  BP:  Supine:   Sitting: 188/77  Standing:    SaO2: 91%RA  MODE:  Ambulation: 350 ft   POST:  Rate/Rhythm: 108 ST  BP:  Supine:   Sitting: 177/75  Standing:    SaO2: 90-92%RA hall, 93%RA room 0910-0945 Pt walked 350 ft on RA with rolling walker, gait belt and asst x 2. Followed with wheelchair and pt sat once to rest. C/o scrotal swelling and chest discomfort from surgery during walk. Tolerated well though. BP elevated but did not increase with walk. To recliner with call bell. Wife in room. PT to see today also.   Graylon Good, RN BSN  03/05/2014 9:41 AM

## 2014-03-05 NOTE — Progress Notes (Signed)
Physical Therapy Treatment Patient Details Name: [name] MRN: [id number] DOB: [birth year] Today's Date: 03/05/2014    History of Present Illness Pt admit for CABGx3.     PT Comments    Pt admitted with above. Pt currently with functional limitations due to endurance deficits. Spoke with wife and encouraged her that pt will do well with use of RW at home and HHPT f/u with transition to cardiac rehab when pt is ready.  Wife and pt agree.   Pt will benefit from skilled PT to increase their independence and safety with mobility to allow discharge to the venue listed below.   Follow Up Recommendations  Home health PT;Supervision/Assistance - 24 hour     Equipment Recommendations  Rolling walker with 5" wheels    Recommendations for Other Services       Precautions / Restrictions Precautions Precautions: Fall;Sternal Restrictions Weight Bearing Restrictions: No    Mobility  Bed Mobility               General bed mobility comments: in chair  Transfers Overall transfer level: Needs assistance Equipment used: Rolling walker (2 wheeled) Transfers: Sit to/from Stand Sit to Stand: Supervision         General transfer comment: Pt held pillow and was able to power up fairly well using LEs with a little assist for stacbility upon initial stand.    Ambulation/Gait Ambulation/Gait assistance: Supervision Ambulation Distance (Feet): 340 Feet Assistive device: Rolling walker (2 wheeled) Gait Pattern/deviations: Step-to pattern;Decreased stride length;Antalgic;Trunk flexed;Wide base of support   Gait velocity interpretation: Below normal speed for age/gender General Gait Details: No cues needed to steer RW.  Safety with RW improving.     Stairs            Wheelchair Mobility    Modified Rankin (Stroke Patients Only)       Balance Overall balance assessment: Needs assistance;History of Falls Sitting-balance support: No upper extremity supported;Feet  supported Sitting balance-Leahy Scale: Good     Standing balance support: Bilateral upper extremity supported;During functional activity Standing balance-Leahy Scale: Poor Standing balance comment: Relies on RW for support/balance.                      Cognition Arousal/Alertness: Awake/alert Behavior During Therapy: WFL for tasks assessed/performed Overall Cognitive Status: Within Functional Limits for tasks assessed                      Exercises General Exercises - Lower Extremity Ankle Circles/Pumps: AROM;Both;10 reps;Supine Long Arc Quad: AROM;Both;10 reps;Seated    General Comments General comments (skin integrity, edema, etc.): Following sternal precautions with occasional cues.       Pertinent Vitals/Pain Right toe pain - Dr. Cyndia Bent made aware of toe pain when he came in.  VSS.    Home Living                      Prior Function            PT Goals (current goals can now be found in the care plan section) Progress towards PT goals: Progressing toward goals    Frequency  Min 3X/week    PT Plan Current plan remains appropriate    Co-evaluation             End of Session Equipment Utilized During Treatment: Gait belt Activity Tolerance: Patient limited by fatigue Patient left: in chair;with call bell/phone within reach;with family/visitor present  Time: 0272-5366 PT Time Calculation (min): 24 min  Charges:  $Gait Training: 8-22 mins $Therapeutic Exercise: 8-22 mins                    G Codes:      [name] [name] 2014-03-27, 12:06 PM [name] Acute Rehabilitation [phone] (pager)

## 2014-03-06 LAB — GLUCOSE, CAPILLARY
GLUCOSE-CAPILLARY: 106 mg/dL — AB (ref 70–99)
Glucose-Capillary: 149 mg/dL — ABNORMAL HIGH (ref 70–99)
Glucose-Capillary: 123 mg/dL — ABNORMAL HIGH (ref 70–99)
Glucose-Capillary: 92 mg/dL (ref 70–99)

## 2014-03-06 LAB — BASIC METABOLIC PANEL
BUN: 25 mg/dL — AB (ref 6–23)
CO2: 27 meq/L (ref 19–32)
CREATININE: 1.26 mg/dL (ref 0.50–1.35)
Calcium: 9.1 mg/dL (ref 8.4–10.5)
Chloride: 103 mEq/L (ref 96–112)
GFR calc Af Amer: 64 mL/min — ABNORMAL LOW (ref >90)
GFR calc non Af Amer: 55 mL/min — ABNORMAL LOW (ref >90)
Glucose, Bld: 146 mg/dL — ABNORMAL HIGH (ref 70–99)
Potassium: 4.4 mEq/L (ref 3.7–5.3)
SODIUM: 142 meq/L (ref 137–147)

## 2014-03-06 MED ORDER — AMLODIPINE BESYLATE 10 MG PO TABS
10.0000 mg | ORAL_TABLET | Freq: Every day | ORAL | Status: DC
  Administered 2014-03-06 – 2014-03-07 (×2): 10 mg via ORAL
  Filled 2014-03-06 (×2): qty 1

## 2014-03-06 NOTE — Progress Notes (Signed)
Patient ambulated in hallway approx. 350 feet, on room air using front wheel walker. Patient tolerated walk very well.This completes second walk for today.  Roxan Hockey

## 2014-03-06 NOTE — Progress Notes (Signed)
Pt refusing to wear CPAP tonight. RT made pt aware that if he changed his mind to please call. RN at bedside.

## 2014-03-06 NOTE — Progress Notes (Signed)
EPW removed per order. No resistance met. No bleeding noted. Ends intact. VS stable, patient in NSR. Bedrest until 1200. VS per protocol. Roxan Hockey

## 2014-03-06 NOTE — Progress Notes (Signed)
CARDIAC REHAB PHASE I   PRE:  Rate/Rhythm: 88 SR  BP:  Supine: 175/68  Sitting:   Standing:    SaO2: 95%RA  MODE:  Ambulation: 400 ft   POST:  Rate/Rhythm: 94 SR  BP:  Supine: 165/63  Sitting:   Standing:    SaO2: 96%RA 1024-1052 Pt walked 400 ft on RA with gait belt use,rollator, and asst x 1 with steady gait. Did not need to sit to rest. Took a couple of standing rest breaks. To bed after walk for pacing wire removal. Tolerated well.   Graylon Good, RN BSN  03/06/2014 10:48 AM

## 2014-03-06 NOTE — Progress Notes (Addendum)
       [address]       [place], [address]             [phone]          5 Days Post-Op Procedure(s) (LRB): REDO CORONARY ARTERY BYPASS GRAFTING TIMES THREE, USING RIGHT GREATER SAPHENOUS VEIN VIA ENDOVEIN HARVEST. (N/A) INTRAOPERATIVE TRANSESOPHAGEAL ECHOCARDIOGRAM (N/A)  Subjective: Feels a little more tired today. Cough improved and had a BM.   Objective: Vital signs in last 24 hours: Patient Vitals for the past 24 hrs:  BP Temp Temp src Pulse Resp SpO2 Weight  03/06/14 0500 - - - - - - 175 lb 11.3 oz (79.7 kg)  03/06/14 0457 175/61 mmHg 98.3 F (36.8 C) Oral 86 18 94 % -  03/05/14 2022 158/57 mmHg 99.6 F (37.6 C) Oral 92 18 94 % -  03/05/14 1806 150/53 mmHg - - - - - -  03/05/14 1642 183/66 mmHg - - 91 18 98 % -  03/05/14 1325 149/57 mmHg 98.7 F (37.1 C) Oral 83 17 97 % -   Current Weight  03/06/14 175 lb 11.3 oz (79.7 kg)     Intake/Output from previous day: 05/19 0701 - 05/20 0700 In: 480 [P.O.:480] Out: 2050 [Urine:2050]  CBGs [phone]   PHYSICAL EXAM:  Heart: RRR Lungs: Clear Wound: Clean and dry Extremities: Mild LE edema, erythema, warmth and tenderness of R foot MTP joint    Lab Results: CBC: Recent Labs  03/04/14 0430 03/05/14 0435  WBC 11.7* 9.8  HGB 8.5* 8.5*  HCT 25.6* 26.1*  PLT 176 224   BMET:  Recent Labs  03/05/14 0435 03/06/14 0524  NA 141 142  K 5.2 4.4  CL 104 103  CO2 28 27  GLUCOSE 130* 146*  BUN 29* 25*  CREATININE 1.45* 1.26  CALCIUM 9.0 9.1    PT/INR: No results found for this basename: LABPROT, INR,  in the last 72 hours    Assessment/Plan: S/P Procedure(s) (LRB): REDO CORONARY ARTERY BYPASS GRAFTING TIMES THREE, USING RIGHT GREATER SAPHENOUS VEIN VIA ENDOVEIN HARVEST. (N/A) INTRAOPERATIVE TRANSESOPHAGEAL ECHOCARDIOGRAM (N/A)  CV- SR, BPs remain elevated. Continue Lopressor, increase Norvasc for HTN. Not on ACE-I or ARB yet due to elevated Cr.   Vol overload- diurese.    DM- Continue Glipizide, wean Levemir. Metformin not started due to elevated Cr. A1C=7.4  Acute/chronic renal failure- Cr trending down.  Continue to watch.  GU- Foley left in place due to history of bladder dysfunction.   Deconditioning- Continue PT, CRPI.   Pulm- Off O2. Continue aggressive pulm toilet, IS/FV, Mucinex.  Likely gout R foot- Continue colchicine.  Possibly home 1-2 days if he continues to improve.    LOS: 9 days    Coolidge Breeze 03/06/2014   Chart reviewed, patient examined, agree with above. He is making good progress. He will probably be ready for discharge in the next day or two. Resume Metformin at discharge. Would continue colchicine for a few weeks to see if his right foot pain and redness resolve. I am not sure if this is gout since he has never had it before but it sure looks like it. He needs to know that if he develops diarrhea he should stop it.

## 2014-03-06 NOTE — Discharge Summary (Signed)
[address]       [place] [address]             [phone]              Discharge Summary  Name: [name] DOB: [birth year] 73 y.o. MRN: [id number]   Admission Date: 02/25/2014 Discharge Date: 03/07/2014    Admitting Diagnosis: Chest pain   Discharge Diagnosis:  Severe left main/three vessel coronary artery disease Non-ST elevation myocardial infarction Expected postoperative blood loss anemia Stage 3 chronic kidney disease Gout right foot Acute on chronic diastolic congestive heart failure Preoperative E.coli urinary tract infection   Past Medical History  Diagnosis Date  . Carotid artery occlusion     Bilateral 50-69% stenoses of the internal carotid arteries. He needs carotid Dopplers every 6 months.  . Coronary artery disease     CABG in 1999. Stent in SVG to RCA in 3790 complicated by no reflow  . Diabetes mellitus without complication   . Hyperlipidemia   . Atherosclerosis of native arteries of the extremities with intermittent claudication Left external iliac artery stent in 2001    Bilateral SFA occlusions documented in 2001.  Marland Kitchen OSA (obstructive sleep apnea)   . Hypertension   . GERD (gastroesophageal reflux disease)   . MI (myocardial infarction)   . Coronary artery disease   . Non-STEMI (non-ST elevated myocardial infarction) 09/24/2013  . 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 1999   . CAD (coronary artery disease), native coronary artery 09/24/2013    Native circumflex Taxus stent 2007, angioplasty of the SVG to native right coronary 2006, DES stent to the SVG of RCA 2007 and resplinted 2009    . COPD (chronic obstructive pulmonary disease) 09/24/2013  . PVD (peripheral vascular disease) 09/24/2013  . Diabetes mellitus with complication 24/0/9735  . PAD (peripheral artery disease)   . History of urinary retention       has to perform self catheterizations  . 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 Esophageal stricture 09/24/2013    History of dilatation. Long-standing history of esophageal reflux      Procedures: REDO CORONARY ARTERY BYPASS GRAFTING x 3 (Saphenous vein graft to posterior descending, saphenous vein graft to diagonal 1, saphenous vein graft to obtuse marginal 1) ENDOSCOPIC  VEIN HARVEST RIGHT LEG - 03/01/2014     HPI:  The patient is a 73 y.o. male with a long complex medical history including diabetes, hyperlipidemia, chronic kidney disease, bilateral carotid artery disease, ongoing heavy smoking and COPD, OSA, aortoiliac and peripheral vascular disease, as well as coronary disease, s/p CABG x 6 by Dr. Cyndia Bent on 12/16/1997. He has continued to smoke since his original surgery and has undergone stenting of the RCA vein graft in 2007 and 2009. He presented to the ER at  [facility] on the date of admission complaining of severe, acute onset chest pain which began that evening and was unrelieved by 4 nitroglycerin.  He called EMS, and the pain had resolved by the time he reached the ER.  Initial EKG was unremarkable and cardiac enzymes were negative.  He was seen by cardiology and was admitted for further evaluation.    Hospital Course:  The patient was admitted to [facility] on 02/25/2014. He ruled  in for a NSTEMI with a troponin of 0.49.  Cardiac catheterization was performed and showed a calcified LM with 90% distal stenosis involving the ostial LCX and LAD. This compromises the large D1, septal perforators, and the entire LCX system. The LCX has an ostial 99% stenosis and there is a moderate OM1 that has 99% proximal stenosis. The remainder of the marginal branches are small. The RCA is occluded proximally. The vein graft to the right coronary system is severely and diffusely diseased and only supplies the PDA now. The vein graft to  the diagonals is patent with 60-70% stenosis in one of the diagonal branches. The LIMA to the LAD is patent. LVEF is 50%. He had pain during the procedure and continued to have pain on IV NTG and Heparin. A cardiac surgery consult was requested and Dr. Cyndia Bent saw the patient.  It was felt that he would benefit from high risk redo CABG at this time. All risks, benefits and alternatives of surgery were explained in detail, and the patient agreed to proceed. The patient was taken to the operating room and underwent the above procedure.    Postoperatively, the patient was slowly weaned from the vent and extubated on postop day 1. He remained hemodynamically stable, but stayed in the ICU due to deconditioning and debilitation.  He was treated with an appropriate course of antibiotics for a preoperative E.coli UTI. Because of his history of chronic bladder dysfunction requiring in and out catheterizations, his Foley was left in place. The patient has a history of chronic kidney disease, and creatinine on admission was 1.68.  It has remained stable postop and has trended down to 1.2.  Blood pressures have been increasing, and the patient has been started on Lopressor and Norvasc, with doses titrated as needed.  He has been volume overloaded and was started on Lasix, to which he is responding well.  His blood sugars have been stable.  He developed erythema and tenderness of his right foot MTP joint which was thought to be due to gout.  He was treated empirically with colchicine, and is symptomatically improved.  Overall, he has progressed well.  Incisions are healing well.  He is ambulating with cardiac rehab and physical therapy and is doing well with mobility.  Vital signs are stable.  The patient has been evaluated on today's date and is medically stable for discharge home.     Recent vital signs:  Filed Vitals:   03/06/14 1110  BP: 168/58  Pulse: 84  Temp:   Resp: 20    Recent laboratory studies:    CBC: Recent Labs  03/04/14 0430 03/05/14 0435  WBC 11.7* 9.8  HGB 8.5* 8.5*  HCT 25.6* 26.1*  PLT 176 224   BMET:  Recent Labs  03/05/14 0435 03/06/14 0524  NA 141 142  K 5.2 4.4  CL 104 103  CO2 28 27  GLUCOSE 130* 146*  BUN 29* 25*  CREATININE 1.45* 1.26  CALCIUM 9.0 9.1    PT/INR: No results found for this basename: LABPROT, INR,  in the last 72 hours   Discharge Medications:     Medication List    STOP taking these medications       clopidogrel 75 MG tablet  Commonly known as:  PLAVIX     irbesartan 300 MG tablet  Commonly known as:  AVAPRO     metoprolol succinate 50 MG 24 hr tablet  Commonly known as:  TOPROL-XL     potassium chloride  10 MEQ tablet  Commonly known as:  KLOR-CON 10      TAKE these medications       acetaminophen 325 MG tablet  Commonly known as:  TYLENOL  Take 650 mg by mouth every 6 (six) hours as needed for moderate pain.     amLODipine 10 MG tablet  Commonly known as:  NORVASC  Take 10 mg by mouth daily.     aspirin EC 325 MG tablet  Take 325 mg by mouth daily.     atorvastatin 20 MG tablet  Commonly known as:  LIPITOR  Take 20 mg by mouth daily.     cholecalciferol 1000 UNITS tablet  Commonly known as:  VITAMIN D  Take 1,000 Units by mouth daily.     colchicine 0.6 MG tablet  Take 1 tablet (0.6 mg total) by mouth 2 (two) times daily. X 2 weeks     furosemide 40 MG tablet  Commonly known as:  LASIX  Take 1 tablet (40 mg total) by mouth daily. X 1 week     glipiZIDE 5 MG 24 hr tablet  Commonly known as:  GLUCOTROL XL  Take 5 mg by mouth daily with breakfast.     metFORMIN 1000 MG tablet  Commonly known as:  GLUCOPHAGE  Take 1,000 mg by mouth 2 (two) times daily with a meal.     metoprolol 50 MG tablet  Commonly known as:  LOPRESSOR  Take 1 tablet (50 mg total) by mouth 2 (two) times daily.     nitroGLYCERIN 0.4 MG SL tablet  Commonly known as:  NITROSTAT  Place 0.4 mg under the tongue every 5 (five)  minutes as needed for chest pain.     omeprazole 20 MG capsule  Commonly known as:  PRILOSEC  Take 20 mg by mouth daily.     oxyCODONE 5 MG immediate release tablet  Commonly known as:  Oxy IR/ROXICODONE  Take 1-2 tablets (5-10 mg total) by mouth every 3 (three) hours as needed for severe pain.     potassium chloride SA 20 MEQ tablet  Commonly known as:  K-DUR,KLOR-CON  Take 1 tablet (20 mEq total) by mouth daily. X 1 week         Discharge Instructions:  The patient is to refrain from driving, heavy lifting or strenuous activity.  May shower daily and clean incisions with soap and water.  May resume regular diet.   Follow Up: Follow-up Information   Follow up with Gaye Pollack, MD On 04/03/2014. (Have a chest x-ray at [place] [facility] at 2:00, then see MD at 3:00)    Specialty:  Cardiothoracic Surgery   Contact information:   [address] [place] [address] [phone]       Follow up with Sinclair Grooms, MD In 2 weeks. (Office will contact you with an appointment)    Specialty:  Cardiology   Contact information:   [address] [place] [address] [phone]      The patient has been discharged on:  1.Beta Blocker: Yes [ x ]  No [ ]   If No, reason:    2.Ace Inhibitor/ARB: Yes [ ]   No [ x ]  If No, reason: Chronic renal insufficiency   3.Statin: Yes [ x ]  No [ ]   If No, reason:    4.Ecasa: Yes [ x ]  No [ ]   If No, reason:       Coolidge Breeze  03/06/2014, 11:58 AM

## 2014-03-07 LAB — SPIROMETRY WITH GRAPH
FEF 25-75 POST: 0.41 L/s
FEF 25-75 PRE: 0.32 L/s
FEF2575-%Change-Post: 30 %
FEF2575-%Pred-Post: 20 %
FEF2575-%Pred-Pre: 15 %
FEV1-%Change-Post: -1 %
FEV1-%Pred-Post: 35 %
FEV1-%Pred-Pre: 36 %
FEV1-PRE: 0.98 L
FEV1-Post: 0.96 L
FEV1FVC-%Change-Post: -3 %
FEV1FVC-%PRED-PRE: 83 %
FEV6-%Change-Post: 3 %
FEV6-%Pred-Post: 46 %
FEV6-%Pred-Pre: 44 %
FEV6-POST: 1.6 L
FEV6-Pre: 1.54 L
FEV6FVC-%CHANGE-POST: 1 %
FEV6FVC-%Pred-Post: 105 %
FEV6FVC-%Pred-Pre: 103 %
FVC-%CHANGE-POST: 1 %
FVC-%PRED-PRE: 43 %
FVC-%Pred-Post: 44 %
FVC-Post: 1.63 L
FVC-Pre: 1.6 L
PRE FEV1/FVC RATIO: 61 %
Post FEV1/FVC ratio: 59 %
Post FEV6/FVC ratio: 98 %
Pre FEV6/FVC Ratio: 96 %

## 2014-03-07 LAB — CULTURE, BLOOD (ROUTINE X 2)
CULTURE: NO GROWTH
Culture: NO GROWTH

## 2014-03-07 LAB — GLUCOSE, CAPILLARY
GLUCOSE-CAPILLARY: 152 mg/dL — AB (ref 70–99)
Glucose-Capillary: 127 mg/dL — ABNORMAL HIGH (ref 70–99)

## 2014-03-07 MED ORDER — OXYCODONE HCL 5 MG PO TABS: 5.0000 mg | ORAL_TABLET | ORAL | Status: DC | PRN

## 2014-03-07 MED ORDER — COLCHICINE 0.6 MG PO TABS: 0.6000 mg | ORAL_TABLET | Freq: Two times a day (BID) | ORAL | Status: DC

## 2014-03-07 MED ORDER — POTASSIUM CHLORIDE CRYS ER 20 MEQ PO TBCR: 20.0000 meq | EXTENDED_RELEASE_TABLET | Freq: Every day | ORAL | Status: DC

## 2014-03-07 MED ORDER — FUROSEMIDE 40 MG PO TABS: 40.0000 mg | ORAL_TABLET | Freq: Every day | ORAL | Status: DC

## 2014-03-07 MED ORDER — METOPROLOL TARTRATE 25 MG PO TABS: 25.0000 mg | ORAL_TABLET | Freq: Two times a day (BID) | ORAL | Status: DC

## 2014-03-07 MED ORDER — METOPROLOL TARTRATE 50 MG PO TABS: 50.0000 mg | ORAL_TABLET | Freq: Two times a day (BID) | ORAL | Status: DC

## 2014-03-07 MED ORDER — METOPROLOL TARTRATE 50 MG PO TABS
50.0000 mg | ORAL_TABLET | Freq: Two times a day (BID) | ORAL | Status: DC
  Administered 2014-03-07: 50 mg via ORAL
  Filled 2014-03-07 (×2): qty 1

## 2014-03-07 NOTE — Progress Notes (Signed)
Physical Therapy Treatment Patient Details Name: [name] MRN: [id number] DOB: 02-16-1941 Today's Date: 03/07/2014    History of Present Illness Pt admit for CABGx3.     PT Comments    Pt admitted with above. Pt currently with functional limitations due to balance and endurance deficits.  Pt will benefit from skilled PT to increase their independence and safety with mobility to allow discharge to the venue listed below.   Follow Up Recommendations  Home health PT;Supervision/Assistance - 24 hour     Equipment Recommendations  Rolling walker with 5" wheels    Recommendations for Other Services       Precautions / Restrictions Precautions Precautions: Fall;Sternal Precaution Comments: handout given for sternal precautions and reviewed Restrictions Weight Bearing Restrictions: No    Mobility  Bed Mobility Overal bed mobility: Needs Assistance Bed Mobility: Supine to Sit;Rolling Rolling: Supervision   Supine to sit: Min assist     General bed mobility comments: Pt had pillow for rolling to follow sternal precautions.  Pt needed a little assist to come from side to sit but very minimal.  Wife aware of this assist.   Transfers Overall transfer level: Needs assistance Equipment used: Rolling walker (2 wheeled) Transfers: Sit to/from Stand Sit to Stand: Supervision         General transfer comment: Pt held pillow and was able to power up fairly well using LEs.  Pt did need cues for hand placement as he initially reached for RW.  With cues, pt able to follow sternal precautions.      Ambulation/Gait Ambulation/Gait assistance: Supervision Ambulation Distance (Feet): 480 Feet Assistive device: Rolling walker (2 wheeled) Gait Pattern/deviations: Step-through pattern;Decreased stride length;Antalgic;Trunk flexed   Gait velocity interpretation: at or above normal speed for age/gender General Gait Details: No cues needed to steer RW.  Safety with RW improving.      Stairs            Wheelchair Mobility    Modified Rankin (Stroke Patients Only)       Balance           Standing balance support: Bilateral upper extremity supported;During functional activity Standing balance-Leahy Scale: Fair Standing balance comment: Pt able to wash hands at sink.                      Cognition Arousal/Alertness: Awake/alert Behavior During Therapy: WFL for tasks assessed/performed Overall Cognitive Status: Within Functional Limits for tasks assessed                      Exercises General Exercises - Lower Extremity Ankle Circles/Pumps: AROM;Both;10 reps;Supine Long Arc Quad: AROM;Both;10 reps;Seated    General Comments General comments (skin integrity, edema, etc.): Follows sternal precautions with occasional cues.  Discussed precautions at length with pt and wife and handout given. Answered wife's questions about f/u as well.         Pertinent Vitals/Pain VSS, no pain    Home Living                      Prior Function            PT Goals (current goals can now be found in the care plan section) Progress towards PT goals: Progressing toward goals    Frequency  Min 3X/week    PT Plan Current plan remains appropriate    Co-evaluation  End of Session Equipment Utilized During Treatment: Gait belt Activity Tolerance: Patient tolerated treatment well Patient left: in chair;with call bell/phone within reach;with family/visitor present     Time: 0828-0910 PT Time Calculation (min): 42 min  Charges:  $Gait Training: 8-22 mins $Therapeutic Exercise: 8-22 mins $Self Care/Home Management: 8-22                    G Codes:      [name] 03/27/14, 11:08 AM [name] [phone] (pager)

## 2014-03-07 NOTE — Progress Notes (Addendum)
       [address]       [place] [address]             [phone]          6 Days Post-Op Procedure(s) (LRB): REDO CORONARY ARTERY BYPASS GRAFTING TIMES THREE, USING RIGHT GREATER SAPHENOUS VEIN VIA ENDOVEIN HARVEST. (N/A) INTRAOPERATIVE TRANSESOPHAGEAL ECHOCARDIOGRAM (N/A)  Subjective: Feels well, no complaints.   Objective: Vital signs in last 24 hours: Patient Vitals for the past 24 hrs:  BP Temp Temp src Pulse Resp SpO2 Weight  03/07/14 0530 170/55 mmHg - - 84 - - -  03/07/14 0448 186/70 mmHg 97.7 F (36.5 C) Oral 86 16 96 % 171 lb (77.565 kg)  03/06/14 2058 164/69 mmHg 98.2 F (36.8 C) Oral 86 18 94 % -  03/06/14 1355 153/62 mmHg 98.6 F (37 C) Oral 78 18 95 % -  03/06/14 1110 168/58 mmHg - - 84 20 95 % -  03/06/14 1053 169/60 mmHg - - 86 - - -   Current Weight  03/07/14 171 lb (77.565 kg)     Intake/Output from previous day: 05/20 0701 - 05/21 0700 In: 39 [P.O.:460] Out: 1850 [Urine:1850]  CBGs 92-106-127    PHYSICAL EXAM:  Heart: RRR Lungs: Clear Wound: Clean and dry Extremities: Mild LE edema, R foor MTP less erythematous and tender today    Lab Results: CBC: Recent Labs  03/05/14 0435  WBC 9.8  HGB 8.5*  HCT 26.1*  PLT 224   BMET:  Recent Labs  03/05/14 0435 03/06/14 0524  NA 141 142  K 5.2 4.4  CL 104 103  CO2 28 27  GLUCOSE 130* 146*  BUN 29* 25*  CREATININE 1.45* 1.26  CALCIUM 9.0 9.1    PT/INR: No results found for this basename: LABPROT, INR,  in the last 72 hours    Assessment/Plan: S/P Procedure(s) (LRB): REDO CORONARY ARTERY BYPASS GRAFTING TIMES THREE, USING RIGHT GREATER SAPHENOUS VEIN VIA ENDOVEIN HARVEST. (N/A) INTRAOPERATIVE TRANSESOPHAGEAL ECHOCARDIOGRAM (N/A)  CV- BPs remain elevated, continue Norvasc, will increase Lopressor. Not on ACE-I or ARB due to elevated creatinine.  DM- sugars stable.  Acute/chronic renal failure- Cr stable, trending down.  Presumed gout R foot-  symptomatically improving, continue Colchicine.  D/c home today- instructions reviewed with patient.    LOS: 10 days    Coolidge Breeze 03/07/2014   Chart reviewed, patient examined, agree with above. Plan to continue colchicine until I see him back in the office.   I told him to continue working on IS and flutter valve and to continue Mucinex.

## 2014-03-07 NOTE — Progress Notes (Signed)
[id number] Cardiac Rehab Completed discharge education with pt and wife. We disscused smoking cessation. I gave pt tips for quitting, coaching contact number and quit smart class information. Pt seems very motivated to quit. He agrees to [employer]. CRP in [place], will send referral. Deon Pilling, RN 03/07/2014 10:37 AM

## 2014-03-08 DIAGNOSIS — I129 Hypertensive chronic kidney disease with stage 1 through stage 4 chronic kidney disease, or unspecified chronic kidney disease: Secondary | ICD-10-CM | POA: Diagnosis not present

## 2014-03-08 DIAGNOSIS — N312 Flaccid neuropathic bladder, not elsewhere classified: Secondary | ICD-10-CM | POA: Diagnosis not present

## 2014-03-08 DIAGNOSIS — N390 Urinary tract infection, site not specified: Secondary | ICD-10-CM | POA: Diagnosis not present

## 2014-03-08 DIAGNOSIS — N183 Chronic kidney disease, stage 3 unspecified: Secondary | ICD-10-CM | POA: Diagnosis not present

## 2014-03-08 DIAGNOSIS — Z48812 Encounter for surgical aftercare following surgery on the circulatory system: Secondary | ICD-10-CM | POA: Diagnosis not present

## 2014-03-08 DIAGNOSIS — A498 Other bacterial infections of unspecified site: Secondary | ICD-10-CM | POA: Diagnosis not present

## 2014-03-12 ENCOUNTER — Telehealth: Payer: Self-pay | Admitting: Interventional Cardiology

## 2014-03-12 DIAGNOSIS — I129 Hypertensive chronic kidney disease with stage 1 through stage 4 chronic kidney disease, or unspecified chronic kidney disease: Secondary | ICD-10-CM | POA: Diagnosis not present

## 2014-03-12 DIAGNOSIS — Z48812 Encounter for surgical aftercare following surgery on the circulatory system: Secondary | ICD-10-CM | POA: Diagnosis not present

## 2014-03-12 DIAGNOSIS — A498 Other bacterial infections of unspecified site: Secondary | ICD-10-CM | POA: Diagnosis not present

## 2014-03-12 DIAGNOSIS — N312 Flaccid neuropathic bladder, not elsewhere classified: Secondary | ICD-10-CM | POA: Diagnosis not present

## 2014-03-12 DIAGNOSIS — N390 Urinary tract infection, site not specified: Secondary | ICD-10-CM | POA: Diagnosis not present

## 2014-03-12 DIAGNOSIS — N183 Chronic kidney disease, stage 3 unspecified: Secondary | ICD-10-CM | POA: Diagnosis not present

## 2014-03-12 NOTE — Telephone Encounter (Signed)
New problem   Pt had heart surgery and was told to call office and sched an appt with Dr Tamala Julian prior to 03/21/14. Please call pt with an appt.

## 2014-03-12 NOTE — Telephone Encounter (Signed)
pt aware of post hospital f/u scheduled with Dr.Smith for 6/1 @1 :15pm pt verbalized understanding.

## 2014-03-14 DIAGNOSIS — I129 Hypertensive chronic kidney disease with stage 1 through stage 4 chronic kidney disease, or unspecified chronic kidney disease: Secondary | ICD-10-CM | POA: Diagnosis not present

## 2014-03-14 DIAGNOSIS — N312 Flaccid neuropathic bladder, not elsewhere classified: Secondary | ICD-10-CM | POA: Diagnosis not present

## 2014-03-14 DIAGNOSIS — N390 Urinary tract infection, site not specified: Secondary | ICD-10-CM | POA: Diagnosis not present

## 2014-03-14 DIAGNOSIS — Z48812 Encounter for surgical aftercare following surgery on the circulatory system: Secondary | ICD-10-CM | POA: Diagnosis not present

## 2014-03-14 DIAGNOSIS — A498 Other bacterial infections of unspecified site: Secondary | ICD-10-CM | POA: Diagnosis not present

## 2014-03-14 DIAGNOSIS — N183 Chronic kidney disease, stage 3 unspecified: Secondary | ICD-10-CM | POA: Diagnosis not present

## 2014-03-18 ENCOUNTER — Ambulatory Visit (INDEPENDENT_AMBULATORY_CARE_PROVIDER_SITE_OTHER): Payer: Medicare Other | Admitting: Interventional Cardiology

## 2014-03-18 ENCOUNTER — Encounter: Payer: Self-pay | Admitting: Interventional Cardiology

## 2014-03-18 VITALS — BP 150/58 | HR 66 | Ht 66.0 in | Wt 164.0 lb

## 2014-03-18 DIAGNOSIS — I5042 Chronic combined systolic (congestive) and diastolic (congestive) heart failure: Secondary | ICD-10-CM

## 2014-03-18 DIAGNOSIS — I10 Essential (primary) hypertension: Secondary | ICD-10-CM

## 2014-03-18 DIAGNOSIS — N183 Chronic kidney disease, stage 3 unspecified: Secondary | ICD-10-CM

## 2014-03-18 DIAGNOSIS — I2510 Atherosclerotic heart disease of native coronary artery without angina pectoris: Secondary | ICD-10-CM

## 2014-03-18 DIAGNOSIS — I509 Heart failure, unspecified: Secondary | ICD-10-CM

## 2014-03-18 NOTE — Progress Notes (Signed)
Patient ID: [name], male   DOB: June 11, 1941, 73 y.o.   MRN: [id number]    [address] [place], [place]  [address] Phone: [phone] Fax:  [phone]  Date:  03/18/2014   ID:  [name], DOB September 28, 1941, MRN [id number]  PCP:  Shirline Frees, MD   ASSESSMENT:  1. Coronary atherosclerosis with recent severe unstable angina/non-STEMI resulting in redo coronary artery bypass grafting with SVG to RCA, SVG to OM, and SVG to diagonal. The patient's had no angina since surgery performed by Dr. Cyndia Bent 2. Hypertension with borderline control 3. Chronic combined systolic and diastolic heart failure, stable 4. Hyperlipidemia on therapy 5. Tobacco use has been discontinued to this point 6. COPD   PLAN:  1. Continue abstention from cigarette smoking 2. Clinical followup in one month 3. Basic metabolic panel on return. 4. Reinstitute ARB therapy starting next office visit if renal function is stable   SUBJECTIVE: [name] is a 73 y.o. male who is doing well with the exception of some sternal soreness since coronary artery bypass grafting, redo performed by Dr. Cyndia Bent. He has not had recurrent angina. He denies palpitations and syncope. No lower sugars swelling. He is abstaining from cigarette smoking. He denies orthopnea, PND, chills, fever, and anorexia.   Wt Readings from Last 3 Encounters:  03/18/14 164 lb (74.39 kg)  03/07/14 171 lb (77.565 kg)  03/07/14 171 lb (77.565 kg)     Past Medical History  Diagnosis Date  . Carotid artery occlusion     Bilateral 50-69% stenoses of the internal carotid arteries. He needs carotid Dopplers every 6 months.  . Coronary artery disease     CABG in 1999. Stent in SVG to RCA in 9983 complicated by no reflow  . Diabetes mellitus without complication   . Hyperlipidemia   . Atherosclerosis of native arteries of the extremities with intermittent claudication Left external iliac artery stent in 2001    Bilateral  SFA occlusions documented in 2001.  Marland Kitchen OSA (obstructive sleep apnea)   . Hypertension   . GERD (gastroesophageal reflux disease)   . MI (myocardial infarction)   . Coronary artery disease   . Non-STEMI (non-ST elevated myocardial infarction) 09/24/2013  . 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 1999   . CAD (coronary artery disease), native coronary artery 09/24/2013    Native circumflex Taxus stent 2007, angioplasty of the SVG to native right coronary 2006, DES stent to the SVG of RCA 2007 and resplinted 2009    . COPD (chronic obstructive pulmonary disease) 09/24/2013  . PVD (peripheral vascular disease) 09/24/2013  . Diabetes mellitus with complication 38/11/5051  . PAD (peripheral artery disease)   . History of urinary retention     has to perform self catheterizations  . 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 Esophageal stricture 09/24/2013    History of dilatation. Long-standing history of esophageal reflux     Current Outpatient Prescriptions  Medication Sig Dispense Refill  . acetaminophen (TYLENOL) 325 MG tablet Take 650 mg by mouth every 6 (six) hours as needed for moderate pain.      Marland Kitchen amLODipine (NORVASC) 10 MG tablet Take 10 mg by mouth daily.      Marland Kitchen aspirin EC 325 MG tablet Take 325 mg  by mouth daily.      Marland Kitchen atorvastatin (LIPITOR) 20 MG tablet Take 20 mg by mouth daily.      . cholecalciferol (VITAMIN D) 1000 UNITS tablet Take 1,000 Units by mouth daily.      . furosemide (LASIX) 40 MG tablet Take 1 tablet (40 mg total) by mouth daily. X 1 week  7 tablet  0  . glipiZIDE (GLUCOTROL XL) 5 MG 24 hr tablet Take 10 mg by mouth daily with breakfast.       . metFORMIN (GLUCOPHAGE) 1000 MG tablet Take 1,000 mg by mouth 2 (two) times daily with a meal.      . metoprolol (LOPRESSOR) 50 MG tablet Take 1 tablet (50 mg  total) by mouth 2 (two) times daily.  60 tablet  1  . nitroGLYCERIN (NITROSTAT) 0.4 MG SL tablet Place 0.4 mg under the tongue every 5 (five) minutes as needed for chest pain.      Marland Kitchen omeprazole (PRILOSEC) 20 MG capsule Take 20 mg by mouth daily.      . potassium chloride (K-DUR) 10 MEQ tablet        No current facility-administered medications for this visit.    Allergies:   No Known Allergies  Social History:  The patient  reports that he has been smoking.  He has never used smokeless tobacco. He reports that he does not drink alcohol or use illicit drugs.   ROS:  Please see the history of present illness.   No blood in her stool. No transient neurological complaints. No claudication.   All other systems reviewed and negative.   OBJECTIVE: VS:  BP 150/58  Pulse 66  Ht 5\' 6"  (1.676 m)  Wt 164 lb (74.39 kg)  BMI 26.48 kg/m2 Well nourished, well developed, in no acute distress, elderly with recent weight loss HEENT: normal Neck: JVD flat. Carotid bruit bilateral  Cardiac:  normal S1, S2; RRR; no murmur. Sternum is stable Lungs:  clear to auscultation bilaterally, no wheezing, rhonchi or rales. Sternum is stable Abd: soft, nontender, no hepatomegaly Ext: Edema no edema. Pulses 2+ Skin: warm and dry Neuro:  CNs 2-12 intact, no focal abnormalities noted  EKG:  Not repeated       Signed, Illene Labrador III, MD 03/18/2014 1:31 PM

## 2014-03-18 NOTE — Patient Instructions (Signed)
Your physician recommends that you continue on your current medications as directed. Please refer to the Current Medication list given to you today.  You have been referred to [facility]  Your physician recommends that you return for lab work on 04/22/14  You have a follow up appointment scheduled for 04/22/14 @3 :15pm

## 2014-03-19 ENCOUNTER — Encounter: Payer: Self-pay | Admitting: Interventional Cardiology

## 2014-03-19 DIAGNOSIS — I129 Hypertensive chronic kidney disease with stage 1 through stage 4 chronic kidney disease, or unspecified chronic kidney disease: Secondary | ICD-10-CM | POA: Diagnosis not present

## 2014-03-19 DIAGNOSIS — N183 Chronic kidney disease, stage 3 unspecified: Secondary | ICD-10-CM | POA: Diagnosis not present

## 2014-03-19 DIAGNOSIS — N390 Urinary tract infection, site not specified: Secondary | ICD-10-CM | POA: Diagnosis not present

## 2014-03-19 DIAGNOSIS — A498 Other bacterial infections of unspecified site: Secondary | ICD-10-CM | POA: Diagnosis not present

## 2014-03-19 DIAGNOSIS — Z48812 Encounter for surgical aftercare following surgery on the circulatory system: Secondary | ICD-10-CM | POA: Diagnosis not present

## 2014-03-19 DIAGNOSIS — N312 Flaccid neuropathic bladder, not elsewhere classified: Secondary | ICD-10-CM | POA: Diagnosis not present

## 2014-03-20 DIAGNOSIS — N312 Flaccid neuropathic bladder, not elsewhere classified: Secondary | ICD-10-CM | POA: Diagnosis not present

## 2014-03-20 DIAGNOSIS — A498 Other bacterial infections of unspecified site: Secondary | ICD-10-CM | POA: Diagnosis not present

## 2014-03-20 DIAGNOSIS — N390 Urinary tract infection, site not specified: Secondary | ICD-10-CM | POA: Diagnosis not present

## 2014-03-20 DIAGNOSIS — N183 Chronic kidney disease, stage 3 unspecified: Secondary | ICD-10-CM | POA: Diagnosis not present

## 2014-03-20 DIAGNOSIS — I129 Hypertensive chronic kidney disease with stage 1 through stage 4 chronic kidney disease, or unspecified chronic kidney disease: Secondary | ICD-10-CM | POA: Diagnosis not present

## 2014-03-20 DIAGNOSIS — Z48812 Encounter for surgical aftercare following surgery on the circulatory system: Secondary | ICD-10-CM | POA: Diagnosis not present

## 2014-03-21 DIAGNOSIS — N312 Flaccid neuropathic bladder, not elsewhere classified: Secondary | ICD-10-CM | POA: Diagnosis not present

## 2014-03-21 DIAGNOSIS — N390 Urinary tract infection, site not specified: Secondary | ICD-10-CM | POA: Diagnosis not present

## 2014-03-21 DIAGNOSIS — N183 Chronic kidney disease, stage 3 unspecified: Secondary | ICD-10-CM | POA: Diagnosis not present

## 2014-03-21 DIAGNOSIS — A498 Other bacterial infections of unspecified site: Secondary | ICD-10-CM | POA: Diagnosis not present

## 2014-03-21 DIAGNOSIS — Z48812 Encounter for surgical aftercare following surgery on the circulatory system: Secondary | ICD-10-CM | POA: Diagnosis not present

## 2014-03-21 DIAGNOSIS — I129 Hypertensive chronic kidney disease with stage 1 through stage 4 chronic kidney disease, or unspecified chronic kidney disease: Secondary | ICD-10-CM | POA: Diagnosis not present

## 2014-03-22 DIAGNOSIS — I129 Hypertensive chronic kidney disease with stage 1 through stage 4 chronic kidney disease, or unspecified chronic kidney disease: Secondary | ICD-10-CM | POA: Diagnosis not present

## 2014-03-22 DIAGNOSIS — N183 Chronic kidney disease, stage 3 unspecified: Secondary | ICD-10-CM | POA: Diagnosis not present

## 2014-03-22 DIAGNOSIS — N312 Flaccid neuropathic bladder, not elsewhere classified: Secondary | ICD-10-CM | POA: Diagnosis not present

## 2014-03-22 DIAGNOSIS — N390 Urinary tract infection, site not specified: Secondary | ICD-10-CM | POA: Diagnosis not present

## 2014-03-22 DIAGNOSIS — Z48812 Encounter for surgical aftercare following surgery on the circulatory system: Secondary | ICD-10-CM | POA: Diagnosis not present

## 2014-03-22 DIAGNOSIS — A498 Other bacterial infections of unspecified site: Secondary | ICD-10-CM | POA: Diagnosis not present

## 2014-03-26 DIAGNOSIS — N183 Chronic kidney disease, stage 3 unspecified: Secondary | ICD-10-CM | POA: Diagnosis not present

## 2014-03-26 DIAGNOSIS — I129 Hypertensive chronic kidney disease with stage 1 through stage 4 chronic kidney disease, or unspecified chronic kidney disease: Secondary | ICD-10-CM | POA: Diagnosis not present

## 2014-03-26 DIAGNOSIS — N390 Urinary tract infection, site not specified: Secondary | ICD-10-CM | POA: Diagnosis not present

## 2014-03-26 DIAGNOSIS — N312 Flaccid neuropathic bladder, not elsewhere classified: Secondary | ICD-10-CM | POA: Diagnosis not present

## 2014-03-26 DIAGNOSIS — Z48812 Encounter for surgical aftercare following surgery on the circulatory system: Secondary | ICD-10-CM | POA: Diagnosis not present

## 2014-03-26 DIAGNOSIS — A498 Other bacterial infections of unspecified site: Secondary | ICD-10-CM | POA: Diagnosis not present

## 2014-03-28 DIAGNOSIS — N390 Urinary tract infection, site not specified: Secondary | ICD-10-CM | POA: Diagnosis not present

## 2014-03-28 DIAGNOSIS — N183 Chronic kidney disease, stage 3 unspecified: Secondary | ICD-10-CM | POA: Diagnosis not present

## 2014-03-28 DIAGNOSIS — A498 Other bacterial infections of unspecified site: Secondary | ICD-10-CM | POA: Diagnosis not present

## 2014-03-28 DIAGNOSIS — N312 Flaccid neuropathic bladder, not elsewhere classified: Secondary | ICD-10-CM | POA: Diagnosis not present

## 2014-03-28 DIAGNOSIS — Z48812 Encounter for surgical aftercare following surgery on the circulatory system: Secondary | ICD-10-CM | POA: Diagnosis not present

## 2014-03-28 DIAGNOSIS — I129 Hypertensive chronic kidney disease with stage 1 through stage 4 chronic kidney disease, or unspecified chronic kidney disease: Secondary | ICD-10-CM | POA: Diagnosis not present

## 2014-04-01 ENCOUNTER — Other Ambulatory Visit: Payer: Self-pay | Admitting: Surgery

## 2014-04-01 DIAGNOSIS — A498 Other bacterial infections of unspecified site: Secondary | ICD-10-CM | POA: Diagnosis not present

## 2014-04-01 DIAGNOSIS — I214 Non-ST elevation (NSTEMI) myocardial infarction: Secondary | ICD-10-CM

## 2014-04-01 DIAGNOSIS — N312 Flaccid neuropathic bladder, not elsewhere classified: Secondary | ICD-10-CM | POA: Diagnosis not present

## 2014-04-01 DIAGNOSIS — I129 Hypertensive chronic kidney disease with stage 1 through stage 4 chronic kidney disease, or unspecified chronic kidney disease: Secondary | ICD-10-CM | POA: Diagnosis not present

## 2014-04-01 DIAGNOSIS — N183 Chronic kidney disease, stage 3 unspecified: Secondary | ICD-10-CM | POA: Diagnosis not present

## 2014-04-01 DIAGNOSIS — N390 Urinary tract infection, site not specified: Secondary | ICD-10-CM | POA: Diagnosis not present

## 2014-04-01 DIAGNOSIS — Z48812 Encounter for surgical aftercare following surgery on the circulatory system: Secondary | ICD-10-CM | POA: Diagnosis not present

## 2014-04-03 ENCOUNTER — Encounter: Payer: Self-pay | Admitting: Surgery

## 2014-04-03 ENCOUNTER — Ambulatory Visit (INDEPENDENT_AMBULATORY_CARE_PROVIDER_SITE_OTHER): Payer: Medicare Other | Admitting: Surgery

## 2014-04-03 ENCOUNTER — Ambulatory Visit
Admission: RE | Admit: 2014-04-03 | Discharge: 2014-04-03 | Disposition: A | Discharge status: Home / Self Care | Payer: Medicare Other | Source: Ambulatory Visit | Attending: Surgery | Admitting: Surgery

## 2014-04-03 VITALS — BP 159/70 | HR 66 | Resp 16 | Ht 66.0 in | Wt 160.0 lb

## 2014-04-03 DIAGNOSIS — Z951 Presence of aortocoronary bypass graft: Secondary | ICD-10-CM

## 2014-04-03 DIAGNOSIS — I219 Acute myocardial infarction, unspecified: Secondary | ICD-10-CM | POA: Diagnosis not present

## 2014-04-03 DIAGNOSIS — I2510 Atherosclerotic heart disease of native coronary artery without angina pectoris: Secondary | ICD-10-CM

## 2014-04-03 DIAGNOSIS — Z48812 Encounter for surgical aftercare following surgery on the circulatory system: Secondary | ICD-10-CM

## 2014-04-03 DIAGNOSIS — I214 Non-ST elevation (NSTEMI) myocardial infarction: Secondary | ICD-10-CM

## 2014-04-03 NOTE — Progress Notes (Signed)
     HPI:  Patient returns for routine postoperative follow-up having undergone redo CABG x 3 on 03/01/2014. The patient's early postoperative recovery while in the hospital was notable for an uncomplicated postop course. Since hospital discharge the patient reports he has been feeling well. He is walking on a treadmill daily without chest pain or shortness of breath. He is taking tylenol for pain because the oxycodone was too strong.   Current Outpatient Prescriptions  Medication Sig Dispense Refill  . acetaminophen (TYLENOL) 325 MG tablet Take 650 mg by mouth every 6 (six) hours as needed for moderate pain.      Marland Kitchen amLODipine (NORVASC) 10 MG tablet Take 10 mg by mouth daily.      Marland Kitchen aspirin EC 325 MG tablet Take 325 mg by mouth daily.      Marland Kitchen atorvastatin (LIPITOR) 20 MG tablet Take 20 mg by mouth daily.      . cholecalciferol (VITAMIN D) 1000 UNITS tablet Take 1,000 Units by mouth daily.      . furosemide (LASIX) 40 MG tablet Take 40 mg by mouth daily.      Marland Kitchen glipiZIDE (GLUCOTROL XL) 5 MG 24 hr tablet Take 10 mg by mouth daily with breakfast.       . metFORMIN (GLUCOPHAGE) 1000 MG tablet Take 1,000 mg by mouth 2 (two) times daily with a meal.      . metoprolol (LOPRESSOR) 50 MG tablet Take 1 tablet (50 mg total) by mouth 2 (two) times daily.  60 tablet  1  . nitroGLYCERIN (NITROSTAT) 0.4 MG SL tablet Place 0.4 mg under the tongue every 5 (five) minutes as needed for chest pain.      Marland Kitchen omeprazole (PRILOSEC) 20 MG capsule Take 20 mg by mouth daily.      . potassium chloride (K-DUR) 10 MEQ tablet        No current facility-administered medications for this visit.    Physical Exam: BP 159/70  Pulse 66  Resp 16  Ht 5\' 6"  (1.676 m)  Wt 160 lb (72.576 kg)  BMI 25.84 kg/m2  SpO2 98% He looks well. Lung exam is clear. Cardiac exam shows a regular rate and rhythm with normal heart sounds. Chest incision is healing well and sternum is stable. The leg incisions are healing well and there  is no peripheral edema.    Diagnostic Tests:  CLINICAL DATA: Acute myocardial infarction  EXAM:  CHEST 2 VIEW  COMPARISON: 03/04/2014  FINDINGS:  There is no focal parenchymal opacity, pleural effusion, or  pneumothorax. The heart and mediastinal contours are unremarkable.  Prior CABG.  Prior lower anterior cervical disc fusion.  IMPRESSION:  No active cardiopulmonary disease.  Electronically Signed  By: Kathreen Devoid  On: 04/03/2014 14:00    Impression:  Overall I think he is doing well. I encouraged him to continue walking. He is planning to participate in cardiac rehab. I told him he could drive his car but should not lift anything heavier than 10 lbs for three months postop.   Plan:  He will continue to follow up with Dr. Tamala Julian and will contact me if he has any problems with his incisions.

## 2014-04-04 DIAGNOSIS — Z48812 Encounter for surgical aftercare following surgery on the circulatory system: Secondary | ICD-10-CM | POA: Diagnosis not present

## 2014-04-04 DIAGNOSIS — A498 Other bacterial infections of unspecified site: Secondary | ICD-10-CM | POA: Diagnosis not present

## 2014-04-04 DIAGNOSIS — N390 Urinary tract infection, site not specified: Secondary | ICD-10-CM | POA: Diagnosis not present

## 2014-04-04 DIAGNOSIS — N312 Flaccid neuropathic bladder, not elsewhere classified: Secondary | ICD-10-CM | POA: Diagnosis not present

## 2014-04-04 DIAGNOSIS — N183 Chronic kidney disease, stage 3 unspecified: Secondary | ICD-10-CM | POA: Diagnosis not present

## 2014-04-04 DIAGNOSIS — I129 Hypertensive chronic kidney disease with stage 1 through stage 4 chronic kidney disease, or unspecified chronic kidney disease: Secondary | ICD-10-CM | POA: Diagnosis not present

## 2014-04-11 ENCOUNTER — Encounter (HOSPITAL_COMMUNITY)
Admission: RE | Admit: 2014-04-11 | Discharge: 2014-04-11 | Disposition: A | Discharge status: Home / Self Care | Payer: Medicare Other | Source: Ambulatory Visit | Attending: Interventional Cardiology | Admitting: Interventional Cardiology

## 2014-04-11 DIAGNOSIS — Z951 Presence of aortocoronary bypass graft: Secondary | ICD-10-CM | POA: Insufficient documentation

## 2014-04-11 DIAGNOSIS — I6529 Occlusion and stenosis of unspecified carotid artery: Secondary | ICD-10-CM | POA: Insufficient documentation

## 2014-04-11 DIAGNOSIS — I70219 Atherosclerosis of native arteries of extremities with intermittent claudication, unspecified extremity: Secondary | ICD-10-CM | POA: Insufficient documentation

## 2014-04-11 DIAGNOSIS — Z5189 Encounter for other specified aftercare: Secondary | ICD-10-CM | POA: Insufficient documentation

## 2014-04-11 DIAGNOSIS — I5032 Chronic diastolic (congestive) heart failure: Secondary | ICD-10-CM | POA: Insufficient documentation

## 2014-04-11 DIAGNOSIS — I739 Peripheral vascular disease, unspecified: Secondary | ICD-10-CM | POA: Insufficient documentation

## 2014-04-11 DIAGNOSIS — I2510 Atherosclerotic heart disease of native coronary artery without angina pectoris: Secondary | ICD-10-CM | POA: Insufficient documentation

## 2014-04-11 NOTE — Progress Notes (Signed)
Cardiac Rehab Medication Review by a Pharmacist  Does the patient  feel that his/her medications are working for him/her?  yes  Has the patient been experiencing any side effects to the medications prescribed?  no  Does the patient measure his/her own blood pressure or blood glucose at home?  no   Does the patient have any problems obtaining medications due to transportation or finances?   no  Understanding of regimen: good Understanding of indications: good Potential of compliance: good    Pharmacist comments: Patient reports to cardiac rehab with his wife. He brought a list of his medications with him and has a good understanding of his medications and their associated indications.   Roderic Palau A. Pincus Badder, PharmD Clinical Pharmacist - Resident Pager: [phone] Pharmacy: [phone] 04/11/2014 8:19 AM

## 2014-04-15 ENCOUNTER — Encounter (HOSPITAL_COMMUNITY)
Admission: RE | Admit: 2014-04-15 | Discharge: 2014-04-15 | Disposition: A | Discharge status: Home / Self Care | Payer: Medicare Other | Source: Ambulatory Visit | Attending: Interventional Cardiology | Admitting: Interventional Cardiology

## 2014-04-15 ENCOUNTER — Encounter (HOSPITAL_COMMUNITY): Payer: Self-pay

## 2014-04-15 DIAGNOSIS — I70219 Atherosclerosis of native arteries of extremities with intermittent claudication, unspecified extremity: Secondary | ICD-10-CM | POA: Diagnosis not present

## 2014-04-15 DIAGNOSIS — I5032 Chronic diastolic (congestive) heart failure: Secondary | ICD-10-CM | POA: Diagnosis not present

## 2014-04-15 DIAGNOSIS — I2510 Atherosclerotic heart disease of native coronary artery without angina pectoris: Secondary | ICD-10-CM | POA: Diagnosis not present

## 2014-04-15 DIAGNOSIS — I739 Peripheral vascular disease, unspecified: Secondary | ICD-10-CM | POA: Diagnosis not present

## 2014-04-15 DIAGNOSIS — Z5189 Encounter for other specified aftercare: Secondary | ICD-10-CM | POA: Diagnosis not present

## 2014-04-15 DIAGNOSIS — I6529 Occlusion and stenosis of unspecified carotid artery: Secondary | ICD-10-CM | POA: Diagnosis not present

## 2014-04-15 DIAGNOSIS — Z951 Presence of aortocoronary bypass graft: Secondary | ICD-10-CM | POA: Diagnosis not present

## 2014-04-15 LAB — GLUCOSE, CAPILLARY
GLUCOSE-CAPILLARY: 240 mg/dL — AB (ref 70–99)
Glucose-Capillary: 158 mg/dL — ABNORMAL HIGH (ref 70–99)

## 2014-04-15 NOTE — Progress Notes (Signed)
Pt started cardiac rehab today.  Pt tolerated light exercise without difficulty.  VSS, telemetry-sinus rhythm, bundle branch block.  Asymptomatic.  PHQ-0.  Pt exhibits no barriers to rehab participation.  Pt has positive coping skills with supportive family. Pt hobbies include fishing and reading. Pt rehab goals are to increase his stamina and energy. Pt states he has recently stopped smoking 02/25/14.  Pt states he has not had cravings or difficulty with stopping at this time. Pt encouraged to call 1800quitnow. Will continue to offer emotional support, counseling and reassurance. Pt oriented to exercise equipment and routine.  Understanding verbalized.

## 2014-04-17 ENCOUNTER — Encounter (HOSPITAL_COMMUNITY)
Admission: RE | Admit: 2014-04-17 | Discharge: 2014-04-17 | Disposition: A | Discharge status: Home / Self Care | Payer: Medicare Other | Source: Ambulatory Visit | Attending: Interventional Cardiology | Admitting: Interventional Cardiology

## 2014-04-17 DIAGNOSIS — Z951 Presence of aortocoronary bypass graft: Secondary | ICD-10-CM | POA: Diagnosis not present

## 2014-04-17 DIAGNOSIS — I214 Non-ST elevation (NSTEMI) myocardial infarction: Secondary | ICD-10-CM | POA: Insufficient documentation

## 2014-04-17 LAB — GLUCOSE, CAPILLARY
GLUCOSE-CAPILLARY: 232 mg/dL — AB (ref 70–99)
Glucose-Capillary: 135 mg/dL — ABNORMAL HIGH (ref 70–99)

## 2014-04-22 ENCOUNTER — Ambulatory Visit (INDEPENDENT_AMBULATORY_CARE_PROVIDER_SITE_OTHER): Payer: Medicare Other | Admitting: Interventional Cardiology

## 2014-04-22 ENCOUNTER — Encounter (HOSPITAL_COMMUNITY)
Admission: RE | Admit: 2014-04-22 | Discharge: 2014-04-22 | Disposition: A | Discharge status: Home / Self Care | Payer: Medicare Other | Source: Ambulatory Visit | Attending: Interventional Cardiology | Admitting: Interventional Cardiology

## 2014-04-22 ENCOUNTER — Encounter: Payer: Self-pay | Admitting: Interventional Cardiology

## 2014-04-22 VITALS — BP 164/68 | HR 68 | Ht 66.0 in | Wt 162.0 lb

## 2014-04-22 DIAGNOSIS — I5042 Chronic combined systolic (congestive) and diastolic (congestive) heart failure: Secondary | ICD-10-CM | POA: Diagnosis not present

## 2014-04-22 DIAGNOSIS — I509 Heart failure, unspecified: Secondary | ICD-10-CM

## 2014-04-22 DIAGNOSIS — I5032 Chronic diastolic (congestive) heart failure: Secondary | ICD-10-CM

## 2014-04-22 DIAGNOSIS — N183 Chronic kidney disease, stage 3 unspecified: Secondary | ICD-10-CM

## 2014-04-22 DIAGNOSIS — I214 Non-ST elevation (NSTEMI) myocardial infarction: Secondary | ICD-10-CM | POA: Diagnosis not present

## 2014-04-22 DIAGNOSIS — I70219 Atherosclerosis of native arteries of extremities with intermittent claudication, unspecified extremity: Secondary | ICD-10-CM | POA: Diagnosis not present

## 2014-04-22 DIAGNOSIS — Z951 Presence of aortocoronary bypass graft: Secondary | ICD-10-CM

## 2014-04-22 DIAGNOSIS — E118 Type 2 diabetes mellitus with unspecified complications: Secondary | ICD-10-CM

## 2014-04-22 DIAGNOSIS — E785 Hyperlipidemia, unspecified: Secondary | ICD-10-CM

## 2014-04-22 MED ORDER — IRBESARTAN 300 MG PO TABS: 150.0000 mg | ORAL_TABLET | Freq: Every day | ORAL | Status: DC

## 2014-04-22 NOTE — Patient Instructions (Signed)
Your physician has recommended you make the following change in your medication:  1) RESTART Irbesartan 150mg  1/2 tablet daily  Your physician recommends that you return for lab work on 04/29/14 between 7:30am-5:15pm  You have a follow up appointment scheduled for 06/27/14 @2 :15pm

## 2014-04-22 NOTE — Progress Notes (Signed)
Patient ID: [name], male   DOB: 04-28-1941, 73 y.o.   MRN: [id number]    [address], [address] [place], [place]  [address] Phone: [phone] Fax:  [phone]  Date:  04/22/2014   ID:  [name], DOB 07/28/1941, MRN [id number]  PCP:  Shirline Frees, MD   ASSESSMENT:  1. chronic combined systolic and diastolic heart failure, compensated 2. Elevated blood pressure 3. Chronic kidney disease, stage III 4. CAD, with recent coronary bypass graft and no recurrence of angina 5. Diabetes mellitus 6. Claudication, stable 7. History of smoking, smoking cessation since bypass surgery   PLAN:  1. Resume Diovan 150 mg daily 2. Basic metabolic panel in one week 3. Clinical followup in 2 months 4. Continue in cardiac rehabilitation. Blood pressure and clinical status will be monitored.   SUBJECTIVE: [name] is a 73 y.o. male who is doing well since repeat coronary bypass surgery. He was comforted by acute kidney injury. The most recent creatinine was 1.24. The patient denies angina. He has not had dyspnea orthopnea. He is in cardiac rehabilitation without great difficulty. He has been noted to have elevated blood pressures. He was on Diovan prior to surgery but this was discontinued before discharge from the hospital because of kidney injury. Kidney injury was likely related to diuresis and ischemia from extracorporeal circulation.  He has discontinued smoking. His appetite is been stable. He denies chills and fever. He has no difficulty with recumbency.   Wt Readings from Last 3 Encounters:  04/22/14 162 lb (73.483 kg)  04/11/14 162 lb 11.2 oz (73.8 kg)  04/03/14 160 lb (72.576 kg)     Past Medical History  Diagnosis Date  . Carotid artery occlusion     Bilateral 50-69% stenoses of the internal carotid arteries. He needs carotid Dopplers every 6 months.  . Coronary artery disease     CABG in 1999. Stent in SVG to RCA in 9379 complicated by no reflow  .  Diabetes mellitus without complication   . Hyperlipidemia   . Atherosclerosis of native arteries of the extremities with intermittent claudication Left external iliac artery stent in 2001    Bilateral SFA occlusions documented in 2001.  Marland Kitchen OSA (obstructive sleep apnea)   . Hypertension   . GERD (gastroesophageal reflux disease)   . MI (myocardial infarction)   . Coronary artery disease   . Non-STEMI (non-ST elevated myocardial infarction) 09/24/2013  . 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 1999   . CAD (coronary artery disease), native coronary artery 09/24/2013    Native circumflex Taxus stent 2007, angioplasty of the SVG to native right coronary 2006, DES stent to the SVG of RCA 2007 and resplinted 2009    . COPD (chronic obstructive pulmonary disease) 09/24/2013  . PVD (peripheral vascular disease) 09/24/2013  . Diabetes mellitus with complication 11/22/971  . PAD (peripheral artery disease)   . History of urinary retention     has to perform self catheterizations  . 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 Esophageal stricture 09/24/2013    History of dilatation. Long-standing history of esophageal reflux     Current Outpatient Prescriptions  Medication Sig Dispense Refill  . acetaminophen (TYLENOL) 325 MG tablet Take 650 mg by mouth every 6 (six) hours as  needed for moderate pain.      Marland Kitchen amLODipine (NORVASC) 10 MG tablet Take 10 mg by mouth daily.      Marland Kitchen aspirin EC 325 MG tablet Take 325 mg by mouth daily.      Marland Kitchen atorvastatin (LIPITOR) 20 MG tablet Take 20 mg by mouth daily.      . cholecalciferol (VITAMIN D) 1000 UNITS tablet Take 1,000 Units by mouth daily.      . furosemide (LASIX) 40 MG tablet Take 40 mg by mouth daily.      Marland Kitchen glipiZIDE (GLUCOTROL) 10 MG tablet Take 10 mg by mouth daily before breakfast.      .  metFORMIN (GLUCOPHAGE) 1000 MG tablet Take 1,000 mg by mouth 2 (two) times daily with a meal.      . metoprolol (LOPRESSOR) 50 MG tablet Take 1 tablet (50 mg total) by mouth 2 (two) times daily.  60 tablet  1  . nitroGLYCERIN (NITROSTAT) 0.4 MG SL tablet Place 0.4 mg under the tongue every 5 (five) minutes as needed for chest pain.      Marland Kitchen omeprazole (PRILOSEC) 20 MG capsule Take 20 mg by mouth daily.      . potassium chloride (K-DUR,KLOR-CON) 10 MEQ tablet Take 10 mEq by mouth daily.       No current facility-administered medications for this visit.    Allergies:   No Known Allergies  Social History:  The patient  reports that he quit smoking about 8 weeks ago. He has never used smokeless tobacco. He reports that he does not drink alcohol or use illicit drugs.   ROS:  Please see the history of present illness.   No chills, fever, edema, syncope, or other cardiac complaints   All other systems reviewed and negative.   OBJECTIVE: VS:  BP 164/68  Pulse 68  Ht 5\' 6"  (1.676 m)  Wt 162 lb (73.483 kg)  BMI 26.16 kg/m2 Well nourished, well developed, in no acute distress, elderly and looks older than stated age  73: normal Neck: JVD flat. Carotid bruit bilateral greater on right  Cardiac:  normal S1, S2; RRR; no murmur Lungs:  clear to auscultation bilaterally, no wheezing, rhonchi or rales Abd: soft, nontender, no hepatomegaly Ext: Edema absent. Pulses absent in lower extremities  Skin: warm and dry Neuro:  CNs 2-12 intact, no focal abnormalities noted  EKG:  Not repeat       Signed, Illene Labrador III, MD 04/22/2014 3:21 PM

## 2014-04-24 ENCOUNTER — Encounter (HOSPITAL_COMMUNITY)
Admission: RE | Admit: 2014-04-24 | Discharge: 2014-04-24 | Disposition: A | Discharge status: Home / Self Care | Payer: Medicare Other | Source: Ambulatory Visit | Attending: Interventional Cardiology | Admitting: Interventional Cardiology

## 2014-04-24 DIAGNOSIS — I214 Non-ST elevation (NSTEMI) myocardial infarction: Secondary | ICD-10-CM | POA: Diagnosis not present

## 2014-04-26 ENCOUNTER — Encounter (HOSPITAL_COMMUNITY)
Admission: RE | Admit: 2014-04-26 | Discharge: 2014-04-26 | Disposition: A | Discharge status: Home / Self Care | Payer: Medicare Other | Source: Ambulatory Visit | Attending: Interventional Cardiology | Admitting: Interventional Cardiology

## 2014-04-26 DIAGNOSIS — I214 Non-ST elevation (NSTEMI) myocardial infarction: Secondary | ICD-10-CM | POA: Diagnosis not present

## 2014-04-26 LAB — GLUCOSE, CAPILLARY
Glucose-Capillary: 101 mg/dL — ABNORMAL HIGH (ref 70–99)
Glucose-Capillary: 105 mg/dL — ABNORMAL HIGH (ref 70–99)

## 2014-04-29 ENCOUNTER — Other Ambulatory Visit (INDEPENDENT_AMBULATORY_CARE_PROVIDER_SITE_OTHER): Payer: Medicare Other

## 2014-04-29 ENCOUNTER — Encounter (HOSPITAL_COMMUNITY)
Admission: RE | Admit: 2014-04-29 | Discharge: 2014-04-29 | Disposition: A | Discharge status: Home / Self Care | Payer: Medicare Other | Source: Ambulatory Visit | Attending: Interventional Cardiology | Admitting: Interventional Cardiology

## 2014-04-29 DIAGNOSIS — I5032 Chronic diastolic (congestive) heart failure: Secondary | ICD-10-CM | POA: Diagnosis not present

## 2014-04-29 DIAGNOSIS — I214 Non-ST elevation (NSTEMI) myocardial infarction: Secondary | ICD-10-CM | POA: Diagnosis not present

## 2014-04-29 LAB — BASIC METABOLIC PANEL
BUN: 43 mg/dL — ABNORMAL HIGH (ref 6–23)
CALCIUM: 9.1 mg/dL (ref 8.4–10.5)
CHLORIDE: 106 meq/L (ref 96–112)
CO2: 25 mEq/L (ref 19–32)
CREATININE: 1.7 mg/dL — AB (ref 0.4–1.5)
GFR: 41.68 mL/min — ABNORMAL LOW (ref >60.00)
GLUCOSE: 66 mg/dL — AB (ref 70–99)
Potassium: 5.1 mEq/L (ref 3.5–5.1)
Sodium: 140 mEq/L (ref 135–145)

## 2014-05-01 ENCOUNTER — Encounter (HOSPITAL_COMMUNITY)
Admission: RE | Admit: 2014-05-01 | Discharge: 2014-05-01 | Disposition: A | Discharge status: Home / Self Care | Payer: Medicare Other | Source: Ambulatory Visit | Attending: Interventional Cardiology | Admitting: Interventional Cardiology

## 2014-05-01 DIAGNOSIS — I214 Non-ST elevation (NSTEMI) myocardial infarction: Secondary | ICD-10-CM | POA: Diagnosis not present

## 2014-05-03 ENCOUNTER — Encounter (HOSPITAL_COMMUNITY)
Admission: RE | Admit: 2014-05-03 | Discharge: 2014-05-03 | Disposition: A | Discharge status: Home / Self Care | Payer: Medicare Other | Source: Ambulatory Visit | Attending: Interventional Cardiology | Admitting: Interventional Cardiology

## 2014-05-03 ENCOUNTER — Telehealth: Payer: Self-pay | Admitting: Interventional Cardiology

## 2014-05-03 DIAGNOSIS — E875 Hyperkalemia: Secondary | ICD-10-CM

## 2014-05-03 DIAGNOSIS — I214 Non-ST elevation (NSTEMI) myocardial infarction: Secondary | ICD-10-CM | POA: Diagnosis not present

## 2014-05-03 NOTE — Telephone Encounter (Signed)
Reviewed lab results with Dr Tamala Julian and he would like the pt to remain on Avapro (Irbesartan) 150mg  daily, STOP Potassium, DECREASE Lasix to 20mg  daily and call the office if he develops SOB and swelling.  Repeat BMP in 2 WEEKS.   I spoke with the pt and he said that over the past 3 days he has had a 3 lb weight gain.  The pt already took an extra lasix 40mg  today.  The pt denies SOB or swelling and denies any change in diet.   I spoke with Dr Tamala Julian again and the new plan based on pt's weight gain is to STOP Potassium, Continue Lasix 40mg  daily and recheck BMP in 2 WEEKS.  Per Dr Tamala Julian the pt can use lasix as previously directed for increased weight.  The pt will recheck BMP on 05/17/14.

## 2014-05-03 NOTE — Telephone Encounter (Signed)
New message     Pt want lab results and need to know dosage on irbesartan 300mg ---should he continue to cut it in half?

## 2014-05-04 ENCOUNTER — Other Ambulatory Visit: Payer: Self-pay | Admitting: Physician Assistant

## 2014-05-06 ENCOUNTER — Other Ambulatory Visit: Payer: Self-pay | Admitting: Interventional Cardiology

## 2014-05-06 ENCOUNTER — Encounter (HOSPITAL_COMMUNITY)
Admission: RE | Admit: 2014-05-06 | Discharge: 2014-05-06 | Disposition: A | Discharge status: Home / Self Care | Payer: Medicare Other | Source: Ambulatory Visit | Attending: Interventional Cardiology | Admitting: Interventional Cardiology

## 2014-05-06 DIAGNOSIS — I214 Non-ST elevation (NSTEMI) myocardial infarction: Secondary | ICD-10-CM | POA: Diagnosis not present

## 2014-05-06 NOTE — Progress Notes (Signed)
I have reviewed home exercise with [name]. The patient was advised to walk 2-4 days per week outside of CRP II for 10 minutes, 3 times per day, progressing to 15 minutes, 2 times per day until he can walk 30 minutes continuously.  Pt will also complete one additional day of hand weights outside of CRP II.  Progression of exercise prescription was discussed.  Reviewed THR, pulse, RPE, sign and symptoms and when to call 911 or MD.  Pt voiced understanding.  Archie Endo, MS, ACSM RCEP 05/06/2014 11:55 AM

## 2014-05-06 NOTE — Telephone Encounter (Signed)
David Crome III, MD at 04/22/2014  3:21 PM metoprolol (LOPRESSOR) 50 MG tablet  Take 1 tablet (50 mg total) by mouth 2 (two) times daily.   60 tablet   Your physician has recommended you make the following change in your medication:   1) RESTART Irbesartan 150mg  1/2 tablet daily

## 2014-05-08 ENCOUNTER — Encounter (HOSPITAL_COMMUNITY)
Admission: RE | Admit: 2014-05-08 | Discharge: 2014-05-08 | Disposition: A | Discharge status: Home / Self Care | Payer: Medicare Other | Source: Ambulatory Visit | Attending: Interventional Cardiology | Admitting: Interventional Cardiology

## 2014-05-08 DIAGNOSIS — I214 Non-ST elevation (NSTEMI) myocardial infarction: Secondary | ICD-10-CM | POA: Diagnosis not present

## 2014-05-10 ENCOUNTER — Encounter (HOSPITAL_COMMUNITY)
Admission: RE | Admit: 2014-05-10 | Discharge: 2014-05-10 | Disposition: A | Discharge status: Home / Self Care | Payer: Medicare Other | Source: Ambulatory Visit | Attending: Interventional Cardiology | Admitting: Interventional Cardiology

## 2014-05-10 DIAGNOSIS — I214 Non-ST elevation (NSTEMI) myocardial infarction: Secondary | ICD-10-CM | POA: Diagnosis not present

## 2014-05-13 ENCOUNTER — Encounter (HOSPITAL_COMMUNITY)
Admission: RE | Admit: 2014-05-13 | Discharge: 2014-05-13 | Disposition: A | Discharge status: Home / Self Care | Payer: Medicare Other | Source: Ambulatory Visit | Attending: Interventional Cardiology | Admitting: Interventional Cardiology

## 2014-05-13 DIAGNOSIS — I214 Non-ST elevation (NSTEMI) myocardial infarction: Secondary | ICD-10-CM | POA: Diagnosis not present

## 2014-05-15 ENCOUNTER — Telehealth (HOSPITAL_COMMUNITY): Payer: Self-pay | Admitting: Family Medicine

## 2014-05-15 ENCOUNTER — Encounter (HOSPITAL_COMMUNITY): Payer: Medicare Other

## 2014-05-15 DIAGNOSIS — E785 Hyperlipidemia, unspecified: Secondary | ICD-10-CM | POA: Diagnosis not present

## 2014-05-15 DIAGNOSIS — I2510 Atherosclerotic heart disease of native coronary artery without angina pectoris: Secondary | ICD-10-CM | POA: Diagnosis not present

## 2014-05-15 DIAGNOSIS — E119 Type 2 diabetes mellitus without complications: Secondary | ICD-10-CM | POA: Diagnosis not present

## 2014-05-15 DIAGNOSIS — I10 Essential (primary) hypertension: Secondary | ICD-10-CM | POA: Diagnosis not present

## 2014-05-17 ENCOUNTER — Encounter (HOSPITAL_COMMUNITY)
Admission: RE | Admit: 2014-05-17 | Discharge: 2014-05-17 | Disposition: A | Discharge status: Home / Self Care | Payer: Medicare Other | Source: Ambulatory Visit | Attending: Interventional Cardiology | Admitting: Interventional Cardiology

## 2014-05-17 ENCOUNTER — Other Ambulatory Visit (INDEPENDENT_AMBULATORY_CARE_PROVIDER_SITE_OTHER): Payer: Medicare Other

## 2014-05-17 DIAGNOSIS — I5032 Chronic diastolic (congestive) heart failure: Secondary | ICD-10-CM | POA: Diagnosis not present

## 2014-05-17 DIAGNOSIS — I214 Non-ST elevation (NSTEMI) myocardial infarction: Secondary | ICD-10-CM | POA: Diagnosis not present

## 2014-05-17 DIAGNOSIS — I2510 Atherosclerotic heart disease of native coronary artery without angina pectoris: Secondary | ICD-10-CM | POA: Diagnosis not present

## 2014-05-17 LAB — BASIC METABOLIC PANEL
BUN: 29 mg/dL — ABNORMAL HIGH (ref 6–23)
CO2: 29 mEq/L (ref 19–32)
CREATININE: 1.7 mg/dL — AB (ref 0.4–1.5)
Calcium: 10.2 mg/dL (ref 8.4–10.5)
Chloride: 99 mEq/L (ref 96–112)
GFR: 43.72 mL/min — AB (ref >60.00)
GLUCOSE: 60 mg/dL — AB (ref 70–99)
Potassium: 5 mEq/L (ref 3.5–5.1)
Sodium: 139 mEq/L (ref 135–145)

## 2014-05-17 NOTE — Progress Notes (Signed)
[name] 73 y.o. male Nutrition Note Spoke with pt. Pt known to this Probation officer from previous admission. Nutrition Plan and Nutrition Survey goals reviewed with pt. Pt is following Step 2 of the Therapeutic Lifestyle Changes diet. Pt wants to lose wt. Pt has not been actively trying to lose wt. Wt loss tips reviewed.  Pt is diabetic. Last A1c indicates blood glucose not optimally controlled. Pt reports his A1c was re-checked 05/15/14 and "it was 6.5." Pt feels his A1c decreased due to Glipizide increased to 10 mg and he started exercising and eating better. Pt checks post-prandial CBG's daily, which reportedly run from 170-180 mg/dL. Pt states he occasionally checks fasting CBG's which have run from 70-80 mg/dL. This Probation officer went over Diabetes Education test results. Pt expressed understanding of the information reviewed. Pt aware of nutrition education classes offered and reports previously attending nutrition classes.  Nutrition Diagnosis   Food-and nutrition-related knowledge deficit related to lack of exposure to information as related to diagnosis of: ? CVD ? DM (A1c 7.4)   Overweight related to excessive energy intake as evidenced by a BMI of 26.7  Nutrition RX/ Estimated Daily Nutrition Needs for: wt loss  1200-1700 Kcal, 30-45 gm fat, 7-11 gm sat fat, 1.1-1.6 gm trans-fat, <1500 mg sodium, 150-175 gm CHO   Nutrition Intervention   Pt's individual nutrition plan reviewed with pt.   Benefits of adopting Therapeutic Lifestyle Changes discussed when Medficts reviewed.   Pt to attend the Portion Distortion class   Pt to attend the  ? Nutrition I class                     ? Nutrition II class        ? Diabetes Blitz class       ? Diabetes Q & A class   Continue client-centered nutrition education by RD, as part of interdisciplinary care. Goal(s)   Pt to identify food quantities necessary to achieve: ? wt loss to a goal wt of 138-156 lb (62.9-71.1 kg) at graduation from cardiac rehab.    CBG  concentrations in the normal range or as close to normal as is safely possible.  Monitor and Evaluate progress toward nutrition goal with team. Nutrition Risk: Change to Moderate Derek Mound, M.Ed, RD, LDN, CDE 05/17/2014 9:55 AM

## 2014-05-20 ENCOUNTER — Encounter (HOSPITAL_COMMUNITY)
Admission: RE | Admit: 2014-05-20 | Discharge: 2014-05-20 | Disposition: A | Discharge status: Home / Self Care | Payer: Medicare Other | Source: Ambulatory Visit | Attending: Interventional Cardiology | Admitting: Interventional Cardiology

## 2014-05-20 DIAGNOSIS — Z951 Presence of aortocoronary bypass graft: Secondary | ICD-10-CM | POA: Insufficient documentation

## 2014-05-20 DIAGNOSIS — I214 Non-ST elevation (NSTEMI) myocardial infarction: Secondary | ICD-10-CM | POA: Insufficient documentation

## 2014-05-22 ENCOUNTER — Encounter (HOSPITAL_COMMUNITY)
Admission: RE | Admit: 2014-05-22 | Discharge: 2014-05-22 | Disposition: A | Discharge status: Home / Self Care | Payer: Medicare Other | Source: Ambulatory Visit | Attending: Interventional Cardiology | Admitting: Interventional Cardiology

## 2014-05-22 DIAGNOSIS — I214 Non-ST elevation (NSTEMI) myocardial infarction: Secondary | ICD-10-CM | POA: Diagnosis not present

## 2014-05-24 ENCOUNTER — Encounter (HOSPITAL_COMMUNITY)
Admission: RE | Admit: 2014-05-24 | Discharge: 2014-05-24 | Disposition: A | Discharge status: Home / Self Care | Payer: Medicare Other | Source: Ambulatory Visit | Attending: Interventional Cardiology | Admitting: Interventional Cardiology

## 2014-05-24 DIAGNOSIS — I214 Non-ST elevation (NSTEMI) myocardial infarction: Secondary | ICD-10-CM | POA: Diagnosis not present

## 2014-05-27 ENCOUNTER — Encounter (HOSPITAL_COMMUNITY)
Admission: RE | Admit: 2014-05-27 | Discharge: 2014-05-27 | Disposition: A | Discharge status: Home / Self Care | Payer: Medicare Other | Source: Ambulatory Visit | Attending: Interventional Cardiology | Admitting: Interventional Cardiology

## 2014-05-27 DIAGNOSIS — I214 Non-ST elevation (NSTEMI) myocardial infarction: Secondary | ICD-10-CM | POA: Diagnosis not present

## 2014-05-29 ENCOUNTER — Encounter (HOSPITAL_COMMUNITY)
Admission: RE | Admit: 2014-05-29 | Discharge: 2014-05-29 | Disposition: A | Discharge status: Home / Self Care | Payer: Medicare Other | Source: Ambulatory Visit | Attending: Interventional Cardiology | Admitting: Interventional Cardiology

## 2014-05-29 DIAGNOSIS — I214 Non-ST elevation (NSTEMI) myocardial infarction: Secondary | ICD-10-CM | POA: Diagnosis not present

## 2014-05-31 ENCOUNTER — Encounter (HOSPITAL_COMMUNITY): Admission: RE | Admit: 2014-05-31 | Payer: Medicare Other | Source: Ambulatory Visit

## 2014-05-31 ENCOUNTER — Telehealth (HOSPITAL_COMMUNITY): Payer: Self-pay | Admitting: *Deleted

## 2014-06-03 ENCOUNTER — Encounter (HOSPITAL_COMMUNITY)
Admission: RE | Admit: 2014-06-03 | Discharge: 2014-06-03 | Disposition: A | Discharge status: Home / Self Care | Payer: Medicare Other | Source: Ambulatory Visit | Attending: Interventional Cardiology | Admitting: Interventional Cardiology

## 2014-06-03 DIAGNOSIS — I214 Non-ST elevation (NSTEMI) myocardial infarction: Secondary | ICD-10-CM | POA: Diagnosis not present

## 2014-06-05 ENCOUNTER — Encounter (HOSPITAL_COMMUNITY)
Admission: RE | Admit: 2014-06-05 | Discharge: 2014-06-05 | Disposition: A | Discharge status: Home / Self Care | Payer: Medicare Other | Source: Ambulatory Visit | Attending: Interventional Cardiology | Admitting: Interventional Cardiology

## 2014-06-05 DIAGNOSIS — I214 Non-ST elevation (NSTEMI) myocardial infarction: Secondary | ICD-10-CM | POA: Diagnosis not present

## 2014-06-07 ENCOUNTER — Encounter (HOSPITAL_COMMUNITY)
Admission: RE | Admit: 2014-06-07 | Discharge: 2014-06-07 | Disposition: A | Discharge status: Home / Self Care | Payer: Medicare Other | Source: Ambulatory Visit | Attending: Interventional Cardiology | Admitting: Interventional Cardiology

## 2014-06-07 DIAGNOSIS — I214 Non-ST elevation (NSTEMI) myocardial infarction: Secondary | ICD-10-CM | POA: Diagnosis not present

## 2014-06-10 ENCOUNTER — Encounter (HOSPITAL_COMMUNITY)
Admission: RE | Admit: 2014-06-10 | Discharge: 2014-06-10 | Disposition: A | Discharge status: Home / Self Care | Payer: Medicare Other | Source: Ambulatory Visit | Attending: Interventional Cardiology | Admitting: Interventional Cardiology

## 2014-06-10 DIAGNOSIS — I214 Non-ST elevation (NSTEMI) myocardial infarction: Secondary | ICD-10-CM | POA: Diagnosis not present

## 2014-06-12 ENCOUNTER — Encounter (HOSPITAL_COMMUNITY)
Admission: RE | Admit: 2014-06-12 | Discharge: 2014-06-12 | Disposition: A | Discharge status: Home / Self Care | Payer: Medicare Other | Source: Ambulatory Visit | Attending: Interventional Cardiology | Admitting: Interventional Cardiology

## 2014-06-12 DIAGNOSIS — I214 Non-ST elevation (NSTEMI) myocardial infarction: Secondary | ICD-10-CM | POA: Diagnosis not present

## 2014-06-14 ENCOUNTER — Encounter (HOSPITAL_COMMUNITY)
Admission: RE | Admit: 2014-06-14 | Discharge: 2014-06-14 | Disposition: A | Discharge status: Home / Self Care | Payer: Medicare Other | Source: Ambulatory Visit | Attending: Interventional Cardiology | Admitting: Interventional Cardiology

## 2014-06-14 DIAGNOSIS — I214 Non-ST elevation (NSTEMI) myocardial infarction: Secondary | ICD-10-CM | POA: Diagnosis not present

## 2014-06-17 ENCOUNTER — Encounter (HOSPITAL_COMMUNITY)
Admission: RE | Admit: 2014-06-17 | Discharge: 2014-06-17 | Disposition: A | Discharge status: Home / Self Care | Payer: Medicare Other | Source: Ambulatory Visit | Attending: Interventional Cardiology | Admitting: Interventional Cardiology

## 2014-06-17 DIAGNOSIS — I214 Non-ST elevation (NSTEMI) myocardial infarction: Secondary | ICD-10-CM | POA: Diagnosis not present

## 2014-06-19 ENCOUNTER — Encounter (HOSPITAL_COMMUNITY)
Admission: RE | Admit: 2014-06-19 | Discharge: 2014-06-19 | Disposition: A | Discharge status: Home / Self Care | Payer: Medicare Other | Source: Ambulatory Visit | Attending: Interventional Cardiology | Admitting: Interventional Cardiology

## 2014-06-19 DIAGNOSIS — I214 Non-ST elevation (NSTEMI) myocardial infarction: Secondary | ICD-10-CM | POA: Insufficient documentation

## 2014-06-19 DIAGNOSIS — Z951 Presence of aortocoronary bypass graft: Secondary | ICD-10-CM | POA: Insufficient documentation

## 2014-06-21 ENCOUNTER — Encounter (HOSPITAL_COMMUNITY): Payer: Medicare Other

## 2014-06-26 ENCOUNTER — Encounter (HOSPITAL_COMMUNITY)
Admission: RE | Admit: 2014-06-26 | Discharge: 2014-06-26 | Disposition: A | Discharge status: Home / Self Care | Payer: Medicare Other | Source: Ambulatory Visit | Attending: Interventional Cardiology | Admitting: Interventional Cardiology

## 2014-06-26 DIAGNOSIS — I214 Non-ST elevation (NSTEMI) myocardial infarction: Secondary | ICD-10-CM | POA: Diagnosis not present

## 2014-06-27 ENCOUNTER — Encounter: Payer: Self-pay | Admitting: Interventional Cardiology

## 2014-06-27 ENCOUNTER — Ambulatory Visit (INDEPENDENT_AMBULATORY_CARE_PROVIDER_SITE_OTHER): Payer: Medicare Other | Admitting: Interventional Cardiology

## 2014-06-27 VITALS — BP 140/68 | HR 66 | Ht 66.0 in | Wt 170.0 lb

## 2014-06-27 DIAGNOSIS — I739 Peripheral vascular disease, unspecified: Secondary | ICD-10-CM

## 2014-06-27 DIAGNOSIS — I25119 Atherosclerotic heart disease of native coronary artery with unspecified angina pectoris: Secondary | ICD-10-CM

## 2014-06-27 DIAGNOSIS — E118 Type 2 diabetes mellitus with unspecified complications: Secondary | ICD-10-CM

## 2014-06-27 DIAGNOSIS — I209 Angina pectoris, unspecified: Secondary | ICD-10-CM

## 2014-06-27 DIAGNOSIS — N183 Chronic kidney disease, stage 3 unspecified: Secondary | ICD-10-CM | POA: Diagnosis not present

## 2014-06-27 DIAGNOSIS — I2510 Atherosclerotic heart disease of native coronary artery without angina pectoris: Secondary | ICD-10-CM

## 2014-06-27 DIAGNOSIS — I214 Non-ST elevation (NSTEMI) myocardial infarction: Secondary | ICD-10-CM | POA: Diagnosis not present

## 2014-06-27 DIAGNOSIS — Z951 Presence of aortocoronary bypass graft: Secondary | ICD-10-CM

## 2014-06-27 DIAGNOSIS — I5032 Chronic diastolic (congestive) heart failure: Secondary | ICD-10-CM

## 2014-06-27 DIAGNOSIS — I10 Essential (primary) hypertension: Secondary | ICD-10-CM

## 2014-06-27 MED ORDER — NITROGLYCERIN 0.4 MG SL SUBL: 0.4000 mg | SUBLINGUAL_TABLET | SUBLINGUAL | Status: DC | PRN

## 2014-06-27 NOTE — Progress Notes (Signed)
Patient ID: [name], male   DOB: [birth year], 73 y.o.   MRN: [id number]    [address] [place], [place]  [address] Phone: [phone] Fax:  [phone]  Date:  06/27/2014   ID:  [name], DOB 08/11/1941, MRN [id number]  PCP:  Shirline Frees, MD   ASSESSMENT:  1. Coronary artery disease with relatively recent coronary bypass surgery, and an episode of chest discomfort this past weekend compatible with angina lasting 30 minutes. 2. Hypertension with increasing blood pressures which need to be closely monitored 2. Hyperlipidemia 4. Peripheral arterial disease with bilateral claudication and carotid artery disease, clinically stable 5. Chronic kidney disease stage IV 6. Diabetes mellitus, complicated  PLAN:  1. EKG is being performed today 2. He should refill his nitroglycerin prescription and use it if any recurrence of angina. If he begins using nitroglycerin for episodes of chest pain he should notify us immediately. If he has recurring angina he will need to have coronary angiography to rule out early bypass graft failure. 3. Continue phase II cardiac rehabilitation 4. Clinical followup in 6 months   SUBJECTIVE: [name] is a 73 y.o. male who has recently undergone repeat surgery for unstable angina. This is third surgical procedure. He is done well over the 4 months since surgery although this past Labor Day weekend, he experienced a 30 minute episode of midsternal discomfort that awakened him from sleep. He did not have nitroglycerin with him. He was in [place]. The discomfort resolved spontaneously. He felt like his prior anginal episodes. Since that episode he has had cardiac rehabilitation and did not notice any chest discomfort or dyspnea. There was no change in his exertional tolerance. He feels well today.   Wt Readings from Last 3 Encounters:  06/27/14 170 lb (77.111 kg)  04/22/14 162 lb (73.483 kg)  04/11/14 162 lb 11.2 oz (73.8 kg)       Past Medical History  Diagnosis Date  . Carotid artery occlusion     Bilateral 50-69% stenoses of the internal carotid arteries. He needs carotid Dopplers every 6 months.  . Hyperlipidemia   . Atherosclerosis of native arteries of the extremities with intermittent claudication Left external iliac artery stent in 2001    Bilateral SFA occlusions documented in 2001.  Marland Kitchen OSA (obstructive sleep apnea)   . Hypertension   . GERD (gastroesophageal reflux disease)   . MI (myocardial infarction)     Prior inferior myocardial infarction 1999  . COPD (chronic obstructive pulmonary disease) 09/24/2013  . Diabetes mellitus with complication 70/06/6282  . 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 Esophageal stricture 09/24/2013    History of dilatation. Long-standing history of esophageal reflux   . 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 1999 Repeat CABG 2015 with SVG to D1, SVG to PDA, and SVG to OM.   Marland Kitchen Chronic diastolic heart failure 03/23/2946  . CKD (chronic kidney disease) stage 3, GFR 30-59 ml/min 02/26/2014    Current Outpatient Prescriptions  Medication Sig Dispense Refill  . acetaminophen (TYLENOL) 325 MG tablet Take 650 mg by mouth every 6 (six) hours as needed for moderate pain.      Marland Kitchen amLODipine (NORVASC) 10 MG tablet Take 10 mg by mouth daily.      Marland Kitchen aspirin  EC 325 MG tablet Take 325 mg by mouth daily.      Marland Kitchen atorvastatin (LIPITOR) 20 MG tablet Take 20 mg by mouth daily.      . cholecalciferol (VITAMIN D) 1000 UNITS tablet Take 1,000 Units by mouth daily.      . furosemide (LASIX) 40 MG tablet Take 40 mg by mouth daily.      Marland Kitchen glipiZIDE (GLUCOTROL) 10 MG tablet Take 10 mg by mouth daily before breakfast.      . irbesartan (AVAPRO) 300 MG tablet Take 0.5 tablets (150 mg total) by mouth daily.      . metFORMIN  (GLUCOPHAGE) 1000 MG tablet Take 1,000 mg by mouth 2 (two) times daily with a meal.      . metoprolol (LOPRESSOR) 50 MG tablet TAKE 1 TABLET BY MOUTH TWICE DAILY  60 tablet  6  . nitroGLYCERIN (NITROSTAT) 0.4 MG SL tablet Place 1 tablet (0.4 mg total) under the tongue every 5 (five) minutes as needed for chest pain.  25 tablet  3  . omeprazole (PRILOSEC) 20 MG capsule Take 20 mg by mouth daily.       No current facility-administered medications for this visit.    Allergies:   No Known Allergies  Social History:  The patient  reports that he quit smoking about 4 months ago. He has never used smokeless tobacco. He reports that he does not drink alcohol or use illicit drugs.   ROS:  Please see the history of present illness.   Continues to have claudication. No neurological complaints. No medication side effects.   All other systems reviewed and negative.   OBJECTIVE: VS:  BP 140/68  Pulse 66  Ht 5\' 6"  (1.676 m)  Wt 170 lb (77.111 kg)  BMI 27.45 kg/m2 Well nourished, well developed, in no acute distress, although his stated age 39: normal Neck: JVD flat. Carotid bruit loud bilateral carotid bruits  Cardiac:  normal S1, S2; RRR; no murmur Lungs:  clear to auscultation bilaterally, no wheezing, rhonchi or rales Abd: soft, nontender, no hepatomegaly Ext: Edema absent. Pulses trace bilateral lower extremities Skin: warm and dry Neuro:  CNs 2-12 intact, no focal abnormalities noted  EKG: Normal sinus rhythm with right bundle branch block, old inferior infarct, and lateral precordial T-wave inversion. Improved since May he K. disease during the patient's unstable angina prior to surgery.    Signed, Illene Labrador III, MD 06/27/2014 6:23 PM

## 2014-06-27 NOTE — Patient Instructions (Signed)
Your physician recommends that you continue on your current medications as directed. Please refer to the Current Medication list given to you today.  An Rx has been sent to your pharmacy for Nitroglycerin. Use as directed  Call the office if your are having to use Nitroglycerin regularly  Your physician wants you to follow-up in: 6 months with Dr.Smith You will receive a reminder letter in the mail two months in advance. If you don't receive a letter, please call our office to schedule the follow-up appointment.

## 2014-06-28 ENCOUNTER — Encounter (HOSPITAL_COMMUNITY)
Admission: RE | Admit: 2014-06-28 | Discharge: 2014-06-28 | Disposition: A | Discharge status: Home / Self Care | Payer: Medicare Other | Source: Ambulatory Visit | Attending: Interventional Cardiology | Admitting: Interventional Cardiology

## 2014-06-28 DIAGNOSIS — I214 Non-ST elevation (NSTEMI) myocardial infarction: Secondary | ICD-10-CM | POA: Diagnosis not present

## 2014-07-01 ENCOUNTER — Encounter (HOSPITAL_COMMUNITY)
Admission: RE | Admit: 2014-07-01 | Discharge: 2014-07-01 | Disposition: A | Discharge status: Home / Self Care | Payer: Medicare Other | Source: Ambulatory Visit | Attending: Interventional Cardiology | Admitting: Interventional Cardiology

## 2014-07-01 DIAGNOSIS — I214 Non-ST elevation (NSTEMI) myocardial infarction: Secondary | ICD-10-CM | POA: Diagnosis not present

## 2014-07-01 NOTE — Progress Notes (Signed)
(  late entry)  Pt arrived at cardiac rehab c/o chest pain 2 nights ago. Pt states he was unable to sleep. Pt describes as indigestion, which he reports as his anginal equivalent.   Pt took NTG x4 over 1 hour without relief. Pt states the pain resolved with sitting up in recliner.  Pt denies edema or dyspnea on exertion. Pt able to exercise at cardiac rehab today without difficulty. VSS.  No significant weight gain in past 2 days.  LM for Dr. Thompson Caul nurse to return call.

## 2014-07-02 ENCOUNTER — Telehealth: Payer: Self-pay

## 2014-07-02 MED ORDER — ISOSORBIDE MONONITRATE ER 60 MG PO TB24: 60.0000 mg | ORAL_TABLET | Freq: Every day | ORAL | Status: DC

## 2014-07-02 NOTE — Telephone Encounter (Signed)
pt given Dr.Smith recommendations to start Isosrbide 60mg  daily. Rx sent to pt pharmacy. pt will pick up and start today. pt adv that if he is heaving breakthrough chest pain, he should call the office to update Dr.Smith. pt may need to have a repeat coronary angio. Pt verbalized understanding.

## 2014-07-03 ENCOUNTER — Encounter (HOSPITAL_COMMUNITY)
Admission: RE | Admit: 2014-07-03 | Discharge: 2014-07-03 | Disposition: A | Discharge status: Home / Self Care | Payer: Medicare Other | Source: Ambulatory Visit | Attending: Interventional Cardiology | Admitting: Interventional Cardiology

## 2014-07-03 DIAGNOSIS — I214 Non-ST elevation (NSTEMI) myocardial infarction: Secondary | ICD-10-CM | POA: Diagnosis not present

## 2014-07-05 ENCOUNTER — Encounter (HOSPITAL_COMMUNITY)
Admission: RE | Admit: 2014-07-05 | Discharge: 2014-07-05 | Disposition: A | Discharge status: Home / Self Care | Payer: Medicare Other | Source: Ambulatory Visit | Attending: Interventional Cardiology | Admitting: Interventional Cardiology

## 2014-07-05 DIAGNOSIS — I214 Non-ST elevation (NSTEMI) myocardial infarction: Secondary | ICD-10-CM | POA: Diagnosis not present

## 2014-07-08 ENCOUNTER — Encounter (HOSPITAL_COMMUNITY)
Admission: RE | Admit: 2014-07-08 | Discharge: 2014-07-08 | Disposition: A | Discharge status: Home / Self Care | Payer: Medicare Other | Source: Ambulatory Visit | Attending: Interventional Cardiology | Admitting: Interventional Cardiology

## 2014-07-08 DIAGNOSIS — I214 Non-ST elevation (NSTEMI) myocardial infarction: Secondary | ICD-10-CM | POA: Diagnosis not present

## 2014-07-10 ENCOUNTER — Encounter (HOSPITAL_COMMUNITY)
Admission: RE | Admit: 2014-07-10 | Discharge: 2014-07-10 | Disposition: A | Discharge status: Home / Self Care | Payer: Medicare Other | Source: Ambulatory Visit | Attending: Interventional Cardiology | Admitting: Interventional Cardiology

## 2014-07-10 DIAGNOSIS — I214 Non-ST elevation (NSTEMI) myocardial infarction: Secondary | ICD-10-CM | POA: Diagnosis not present

## 2014-07-12 ENCOUNTER — Encounter (HOSPITAL_COMMUNITY)
Admission: RE | Admit: 2014-07-12 | Discharge: 2014-07-12 | Disposition: A | Discharge status: Home / Self Care | Payer: Medicare Other | Source: Ambulatory Visit | Attending: Interventional Cardiology | Admitting: Interventional Cardiology

## 2014-07-12 DIAGNOSIS — Z23 Encounter for immunization: Secondary | ICD-10-CM | POA: Diagnosis not present

## 2014-07-12 DIAGNOSIS — I214 Non-ST elevation (NSTEMI) myocardial infarction: Secondary | ICD-10-CM | POA: Diagnosis not present

## 2014-07-15 ENCOUNTER — Telehealth: Payer: Self-pay

## 2014-07-15 ENCOUNTER — Encounter (HOSPITAL_COMMUNITY)
Admission: RE | Admit: 2014-07-15 | Discharge: 2014-07-15 | Disposition: A | Discharge status: Home / Self Care | Payer: Medicare Other | Source: Ambulatory Visit | Attending: Interventional Cardiology | Admitting: Interventional Cardiology

## 2014-07-15 DIAGNOSIS — I209 Angina pectoris, unspecified: Secondary | ICD-10-CM

## 2014-07-15 NOTE — Progress Notes (Signed)
Pt arrived at cardiac rehab reporting episode of chest pain at home 2 nights ago.  Pt states he awoke Sunday morning at 3am with pain. Pt sat in recliner and took NTG x2 with relief.  Pain has not returned.  Pt also reports episode of fatigue and dyspnea on exertion walking uphill Friday night.  Pt asymptomatic today. Pt did not exercise. PC to Dr. Thompson Caul nurse, Lattie Haw, who will report episode to Dr. Tamala Julian or DOD and notify pt of recommendation.  Pt instructed to avoid heavy exertion until further evaluation, proper use of NTG reviewed and when to present to ED.  Understanding verbalized.

## 2014-07-15 NOTE — Telephone Encounter (Signed)
Received call from [facility] cardiac rehab Mechele Claude) she sts that the pt arrived to exercise this morning and reported another episode of chest pain that required use of 2 Nitro tab before it was relieved. Pt sts that he was at a wedding this Saturday and had to do a little walking, pt sts that he did have to stop several times because he was sob. Pt sts that he notices that his exertional dyspnea has gotten worse. Pt is lasono having to use Nitro more often.Mechele Claude sts that pt will not exercise today. Adv her that Dr.Smith is out of the office today @ jury duty. Adv her that I will message him and f/u with the pt today. Mechele Claude verbalized understanding and she notified pt of plan.

## 2014-07-17 ENCOUNTER — Encounter (HOSPITAL_COMMUNITY): Payer: Medicare Other

## 2014-07-17 ENCOUNTER — Other Ambulatory Visit (INDEPENDENT_AMBULATORY_CARE_PROVIDER_SITE_OTHER): Payer: Medicare Other

## 2014-07-17 ENCOUNTER — Encounter (HOSPITAL_COMMUNITY): Payer: Self-pay | Admitting: Pharmacy Technician

## 2014-07-17 DIAGNOSIS — I209 Angina pectoris, unspecified: Secondary | ICD-10-CM

## 2014-07-17 LAB — CBC WITH DIFFERENTIAL/PLATELET
BASOS PCT: 0.3 % (ref 0.0–3.0)
Basophils Absolute: 0 10*3/uL (ref 0.0–0.1)
EOS ABS: 0.3 10*3/uL (ref 0.0–0.7)
Eosinophils Relative: 2 % (ref 0.0–5.0)
HCT: 34.9 % — ABNORMAL LOW (ref 39.0–52.0)
HEMOGLOBIN: 11.3 g/dL — AB (ref 13.0–17.0)
Lymphocytes Relative: 13.1 % (ref 12.0–46.0)
Lymphs Abs: 2 10*3/uL (ref 0.7–4.0)
MCHC: 32.3 g/dL (ref 30.0–36.0)
MCV: 73.7 fl — AB (ref 78.0–100.0)
MONO ABS: 1.1 10*3/uL — AB (ref 0.1–1.0)
Monocytes Relative: 7.7 % (ref 3.0–12.0)
NEUTROS ABS: 11.4 10*3/uL — AB (ref 1.4–7.7)
NEUTROS PCT: 76.9 % (ref 43.0–77.0)
Platelets: 301 10*3/uL (ref 150.0–400.0)
RBC: 4.74 Mil/uL (ref 4.22–5.81)
RDW: 16.1 % — ABNORMAL HIGH (ref 11.5–15.5)
WBC: 14.9 10*3/uL — ABNORMAL HIGH (ref 4.0–10.5)

## 2014-07-17 LAB — BASIC METABOLIC PANEL
BUN: 43 mg/dL — AB (ref 6–23)
CO2: 23 mEq/L (ref 19–32)
CREATININE: 1.7 mg/dL — AB (ref 0.4–1.5)
Calcium: 9.2 mg/dL (ref 8.4–10.5)
Chloride: 104 mEq/L (ref 96–112)
GFR: 41.38 mL/min — AB (ref >60.00)
Glucose, Bld: 146 mg/dL — ABNORMAL HIGH (ref 70–99)
Potassium: 4.4 mEq/L (ref 3.5–5.1)
Sodium: 138 mEq/L (ref 135–145)

## 2014-07-17 LAB — PROTIME-INR
INR: 1.1 ratio — AB (ref 0.8–1.0)
Prothrombin Time: 11.7 s (ref 9.6–13.1)

## 2014-07-17 NOTE — Progress Notes (Signed)
Pt graduated from cardiac rehab program today with completion of 34 exercise sessions in Phase II. Pt maintained good attendance and progressed nicely during his participation in rehab as evidenced by increased MET level.   Medication list reconciled. Repeat  PHQ9 score- 0 .  Recent participation limited by angina.  Dr. Tamala Julian nurse aware.  Pt awaiting Dr. Tamala Julian recommendations.  Pt has made significant lifestyle changes and should be commended for his success. Pt feels he has not achieved his goals during cardiac rehab because he continues to have chest pain.   Pt plans to continue exercising on his own by walking on treadmill at home, warm up/cool down stretches.  Pt would also like to become a volunteer in the cardiac rehab program.

## 2014-07-17 NOTE — Telephone Encounter (Signed)
Pt aware of Dr.Smith recommendations to proceed with cardiac cath. Adv pt I will need to call [facility] to schedule and will call pt back with sch date,time, and instructions. Pt agreeable and verbalized understanding.

## 2014-07-17 NOTE — Telephone Encounter (Signed)
pt aware cardiac cath sch on 10/6 @ 9am with Dr.Smith.pt given preprocedure verbal instructions.pt will come to the office today for labs.pt adv wrirtten instructions will be left at the front desk for pick up.pt adv to seek emergency care if having prolonged chest pain not relieved by Nitro. Pt agreeable and verbalized understanding.

## 2014-07-19 ENCOUNTER — Telehealth: Payer: Self-pay

## 2014-07-19 ENCOUNTER — Other Ambulatory Visit: Payer: Self-pay | Admitting: Interventional Cardiology

## 2014-07-19 ENCOUNTER — Encounter (HOSPITAL_COMMUNITY): Payer: Medicare Other

## 2014-07-19 DIAGNOSIS — E1159 Type 2 diabetes mellitus with other circulatory complications: Secondary | ICD-10-CM

## 2014-07-19 DIAGNOSIS — I209 Angina pectoris, unspecified: Principal | ICD-10-CM

## 2014-07-19 NOTE — Telephone Encounter (Signed)
pt aware of lab results and Dr.Smith instructions.  Stop Furosemide and Avapro the day before cath. Needs to come in 2-3 hours early for hydration. py will arrive @ [facility] @5am .pt verbalized understanding.

## 2014-07-19 NOTE — Telephone Encounter (Signed)
Message copied by Lamar Laundry on Fri Jul 19, 2014  1:36 PM ------      Message from: Daneen Schick      Created: Thu Jul 18, 2014  3:09 PM       Stop Furosemide and Avapro the day before cath. Needs to come in 2-3 hours early for hydration ------

## 2014-07-19 NOTE — Telephone Encounter (Signed)
Message copied by Lamar Laundry on Fri Jul 19, 2014  1:31 PM ------      Message from: Daneen Schick      Created: Thu Jul 18, 2014  3:09 PM       Stop Furosemide and Avapro the day before cath. Needs to come in 2-3 hours early for hydration ------

## 2014-07-19 NOTE — Telephone Encounter (Signed)
Message copied by Lamar Laundry on Fri Jul 19, 2014 12:02 PM ------      Message from: Daneen Schick      Created: Thu Jul 18, 2014  3:09 PM       Stop Furosemide and Avapro the day before cath. Needs to come in 2-3 hours early for hydration ------

## 2014-07-22 ENCOUNTER — Encounter (HOSPITAL_COMMUNITY): Payer: Medicare Other

## 2014-07-23 ENCOUNTER — Ambulatory Visit (HOSPITAL_COMMUNITY)
Admission: RE | Admit: 2014-07-23 | Discharge: 2014-07-23 | Disposition: A | Discharge status: Home / Self Care | Payer: Medicare Other | Source: Ambulatory Visit | Attending: Interventional Cardiology | Admitting: Interventional Cardiology

## 2014-07-23 ENCOUNTER — Encounter (HOSPITAL_COMMUNITY)
Admission: RE | Disposition: A | Discharge status: Home / Self Care | Payer: Self-pay | Source: Ambulatory Visit | Attending: Interventional Cardiology

## 2014-07-23 DIAGNOSIS — I2510 Atherosclerotic heart disease of native coronary artery without angina pectoris: Secondary | ICD-10-CM

## 2014-07-23 DIAGNOSIS — G4733 Obstructive sleep apnea (adult) (pediatric): Secondary | ICD-10-CM | POA: Diagnosis not present

## 2014-07-23 DIAGNOSIS — E118 Type 2 diabetes mellitus with unspecified complications: Secondary | ICD-10-CM | POA: Insufficient documentation

## 2014-07-23 DIAGNOSIS — I25119 Atherosclerotic heart disease of native coronary artery with unspecified angina pectoris: Secondary | ICD-10-CM | POA: Insufficient documentation

## 2014-07-23 DIAGNOSIS — N183 Chronic kidney disease, stage 3 unspecified: Secondary | ICD-10-CM

## 2014-07-23 DIAGNOSIS — Z7982 Long term (current) use of aspirin: Secondary | ICD-10-CM | POA: Diagnosis not present

## 2014-07-23 DIAGNOSIS — E785 Hyperlipidemia, unspecified: Secondary | ICD-10-CM | POA: Insufficient documentation

## 2014-07-23 DIAGNOSIS — J449 Chronic obstructive pulmonary disease, unspecified: Secondary | ICD-10-CM | POA: Diagnosis not present

## 2014-07-23 DIAGNOSIS — I5032 Chronic diastolic (congestive) heart failure: Secondary | ICD-10-CM | POA: Diagnosis not present

## 2014-07-23 DIAGNOSIS — Z87891 Personal history of nicotine dependence: Secondary | ICD-10-CM | POA: Diagnosis not present

## 2014-07-23 DIAGNOSIS — I252 Old myocardial infarction: Secondary | ICD-10-CM | POA: Diagnosis not present

## 2014-07-23 DIAGNOSIS — I10 Essential (primary) hypertension: Secondary | ICD-10-CM | POA: Insufficient documentation

## 2014-07-23 DIAGNOSIS — K219 Gastro-esophageal reflux disease without esophagitis: Secondary | ICD-10-CM | POA: Diagnosis not present

## 2014-07-23 DIAGNOSIS — I129 Hypertensive chronic kidney disease with stage 1 through stage 4 chronic kidney disease, or unspecified chronic kidney disease: Secondary | ICD-10-CM | POA: Insufficient documentation

## 2014-07-23 DIAGNOSIS — Z955 Presence of coronary angioplasty implant and graft: Secondary | ICD-10-CM | POA: Insufficient documentation

## 2014-07-23 DIAGNOSIS — Z951 Presence of aortocoronary bypass graft: Secondary | ICD-10-CM | POA: Insufficient documentation

## 2014-07-23 DIAGNOSIS — I209 Angina pectoris, unspecified: Secondary | ICD-10-CM

## 2014-07-23 DIAGNOSIS — E1159 Type 2 diabetes mellitus with other circulatory complications: Secondary | ICD-10-CM

## 2014-07-23 DIAGNOSIS — N184 Chronic kidney disease, stage 4 (severe): Secondary | ICD-10-CM | POA: Diagnosis not present

## 2014-07-23 HISTORY — PX: LEFT HEART CATHETERIZATION WITH CORONARY/GRAFT ANGIOGRAM: SHX5450

## 2014-07-23 LAB — GLUCOSE, CAPILLARY: Glucose-Capillary: 198 mg/dL — ABNORMAL HIGH (ref 70–99)

## 2014-07-23 SURGERY — LEFT HEART CATHETERIZATION WITH CORONARY/GRAFT ANGIOGRAM
Anesthesia: LOCAL

## 2014-07-23 MED ORDER — VERAPAMIL HCL 2.5 MG/ML IV SOLN
INTRAVENOUS | Status: AC
  Filled 2014-07-23: qty 2

## 2014-07-23 MED ORDER — NITROGLYCERIN 1 MG/10 ML FOR IR/CATH LAB
INTRA_ARTERIAL | Status: AC
Start: 2014-07-23 — End: 2014-07-23
  Filled 2014-07-23: qty 10

## 2014-07-23 MED ORDER — SODIUM CHLORIDE 0.9 % IJ SOLN
3.0000 mL | INTRAMUSCULAR | Status: DC | PRN
Start: 2014-07-23 — End: 2014-07-23

## 2014-07-23 MED ORDER — FENTANYL CITRATE 0.05 MG/ML IJ SOLN
INTRAMUSCULAR | Status: AC
  Filled 2014-07-23: qty 2

## 2014-07-23 MED ORDER — ONDANSETRON HCL 4 MG/2ML IJ SOLN: 4.0000 mg | Freq: Four times a day (QID) | INTRAMUSCULAR | Status: DC | PRN

## 2014-07-23 MED ORDER — ASPIRIN 81 MG PO CHEW: 81.0000 mg | CHEWABLE_TABLET | ORAL | Status: DC

## 2014-07-23 MED ORDER — SODIUM CHLORIDE 0.9 % IV SOLN: 250.0000 mL | INTRAVENOUS | Status: DC | PRN

## 2014-07-23 MED ORDER — HEPARIN SODIUM (PORCINE) 1000 UNIT/ML IJ SOLN
INTRAMUSCULAR | Status: AC
  Filled 2014-07-23: qty 1

## 2014-07-23 MED ORDER — LIDOCAINE HCL (PF) 1 % IJ SOLN
INTRAMUSCULAR | Status: AC
  Filled 2014-07-23: qty 30

## 2014-07-23 MED ORDER — SODIUM CHLORIDE 0.9 % IV SOLN
INTRAVENOUS | Status: DC
  Administered 2014-07-23 (×2): via INTRAVENOUS

## 2014-07-23 MED ORDER — SODIUM CHLORIDE 0.9 % IJ SOLN: 3.0000 mL | Freq: Two times a day (BID) | INTRAMUSCULAR | Status: DC

## 2014-07-23 MED ORDER — MIDAZOLAM HCL 2 MG/2ML IJ SOLN
INTRAMUSCULAR | Status: AC
  Filled 2014-07-23: qty 2

## 2014-07-23 MED ORDER — HEPARIN (PORCINE) IN NACL 2-0.9 UNIT/ML-% IJ SOLN
INTRAMUSCULAR | Status: AC
  Filled 2014-07-23: qty 1500

## 2014-07-23 MED ORDER — SODIUM CHLORIDE 0.9 % IV SOLN: INTRAVENOUS | Status: DC

## 2014-07-23 NOTE — Discharge Instructions (Signed)
Radial Site Care °Refer to this sheet in the next few weeks. These instructions provide you with information on caring for yourself after your procedure. Your caregiver may also give you more specific instructions. Your treatment has been planned according to current medical practices, but problems sometimes occur. Call your caregiver if you have any problems or questions after your procedure. °HOME CARE INSTRUCTIONS °· You may shower the day after the procedure. Remove the bandage (dressing) and gently wash the site with plain soap and water. Gently pat the site dry. °· Do not apply powder or lotion to the site. °· Do not submerge the affected site in water for 3 to 5 days. °· Inspect the site at least twice daily. °· Do not flex or bend the affected arm for 24 hours. °· No lifting over 5 pounds (2.3 kg) for 5 days after your procedure. °· Do not drive home if you are discharged the same day of the procedure. Have someone else drive you. °· You may drive 24 hours after the procedure unless otherwise instructed by your caregiver. °· Do not operate machinery or power tools for 24 hours. °· A responsible adult should be with you for the first 24 hours after you arrive home. °What to expect: °· Any bruising will usually fade within 1 to 2 weeks. °· Blood that collects in the tissue (hematoma) may be painful to the touch. It should usually decrease in size and tenderness within 1 to 2 weeks. °SEEK IMMEDIATE MEDICAL CARE IF: °· You have unusual pain at the radial site. °· You have redness, warmth, swelling, or pain at the radial site. °· You have drainage (other than a small amount of blood on the dressing). °· You have chills. °· You have a fever or persistent symptoms for more than 72 hours. °· You have a fever and your symptoms suddenly get worse. °· Your arm becomes pale, cool, tingly, or numb. °· You have heavy bleeding from the site. Hold pressure on the site. °Document Released: 11/06/2010 Document Revised:  12/27/2011 Document Reviewed: 11/06/2010 °ExitCare® Patient Information ©2015 [employer]. This information is not intended to replace advice given to you by your health care provider. Make sure you discuss any questions you have with your health care provider. ° °

## 2014-07-23 NOTE — CV Procedure (Signed)
Left Heart Catheterization with Coronary and Bypass Angiography Report  [name]  73 y.o.  male Apr 03, 1941  Procedure Date: 07/23/2014 Referring Physician: Valli Glance Blenda Bridegroom, M.D. Primary Cardiologist:: Same  INDICATIONS: Class III angina in a patient recently undergoing repeat coronary bypass surgery. The study is being done to exclude early bypass graft failure .  PROCEDURE: 1. Left heart catheterization; 2. Bypass graft angiography; 3. Native coronary angiography  CONSENT:  The risks, benefits, and details of the procedure were explained in detail to the patient. Risks including death, stroke, heart attack, kidney injury, allergy, limb ischemia, bleeding and radiation injury were discussed.  The patient verbalized understanding and wanted to proceed.  Informed written consent was obtained.  PROCEDURE TECHNIQUE:  After Xylocaine anesthesia a 5 French Slender sheath was placed in the left radial artery with an angiocath and the modified Seldinger technique.  Coronary angiography was done using a 5 F JR 4, JL 4, and internal mammary catheters.  Left ventriculography was not performed to limit contrast load.   After reviewing the digital images, the procedure was terminated.   CONTRAST:  Total of 75 cc.  COMPLICATIONS:  None   HEMODYNAMICS:  Aortic pressure 162/59 mmHg  ANGIOGRAPHIC DATA:   The left main coronary artery is 95% stenosed distally before the origin of the circumflex/LAD bifurcation..  The left anterior descending artery is obstructed 90% in its ostium and100% after the origin of the first diagonal which is totally occluded.  The left circumflex artery is 90% at the ostium and 99% in the mid vessel before the origin of 3 tiny distal obtuse marginals. The major obtuse marginal branches are supplied by bypass grafts. . The right coronary artery is totally occluded proximal and previously documented by prior angiography.Marland Kitchen  BYPASS GRAFT ANGIOGRAPHY: Saphenous  (new) vein graft to the diagonal is widely patent. Saphenous (new) vein graft to the obtuse marginal is widely patent. Saphenous (new) vein graft to the PDA is widely patent. Saphenous (old) vein graft sequential to diagonal 2 and 3 is still patent. Saphenous (old) vein graft to the PDA is 99% obstructed with sluggish flow. LIMA to LAD is widely patent. The LAD distal to the graft insertion site contains 70% stenosis  PCI RESULTS: None performed  LEFT VENTRICULOGRAM:  Left ventricular angiogram was not performed due to renal insufficiency .   IMPRESSIONS:  1. Patent saphenous vein graft to the diagonal, saphenous vein graft to the obtuse marginal, and saphenous vein graft to the PDA. 2. Severe native left main, ostial circumflex and ostial LAD with progression since bypass surgery. Territories beyond the stenoses are supplied by patent grafts with the exception of three tiny obtuse marginals in the distal circumflex territory (likely source of pain). 3. Total occlusion of the RCA in the midsegment. 4. 95% ostial circumflex stenosis and 99% mid vessel stenosis. Total occlusion of the dominant obtuse marginal branch (supplied by saphenous vein graft). 5. Widely patent LIMA to LAD. The distal LAD beyond the graft insertion site is totally occluded and not collateralized. The LAD is filled retrograde from the anastomosis. 6. Left ventriculography was not performed because of renal insufficiency   RECOMMENDATION:  1. Three hours of hydration prior to discharge from outpatient. 2. Increase isosorbide 120 mg daily. 3. Do not resume her Irbesartan or metformin until okayed by Dr. Tamala Julian. 4. Basic metabolic panel on Wednesday. 5. Overall the new bypass grafts are widely patent. Angina is coming from distal vessel disease in all 3  vessel territories. We will intensify medical therapy. Intervention may jeopardize renal function.

## 2014-07-24 ENCOUNTER — Other Ambulatory Visit (INDEPENDENT_AMBULATORY_CARE_PROVIDER_SITE_OTHER): Payer: Medicare Other | Admitting: *Deleted

## 2014-07-24 ENCOUNTER — Encounter (HOSPITAL_COMMUNITY): Payer: Medicare Other

## 2014-07-24 ENCOUNTER — Telehealth: Payer: Self-pay

## 2014-07-24 ENCOUNTER — Other Ambulatory Visit: Payer: Self-pay | Admitting: *Deleted

## 2014-07-24 DIAGNOSIS — I5032 Chronic diastolic (congestive) heart failure: Secondary | ICD-10-CM

## 2014-07-24 LAB — BASIC METABOLIC PANEL
BUN: 22 mg/dL (ref 6–23)
CO2: 23 meq/L (ref 19–32)
Calcium: 8.7 mg/dL (ref 8.4–10.5)
Chloride: 104 mEq/L (ref 96–112)
Creatinine, Ser: 1.5 mg/dL (ref 0.4–1.5)
GFR: 47.32 mL/min — ABNORMAL LOW (ref >60.00)
Glucose, Bld: 159 mg/dL — ABNORMAL HIGH (ref 70–99)
POTASSIUM: 3.5 meq/L (ref 3.5–5.1)
SODIUM: 139 meq/L (ref 135–145)

## 2014-07-24 MED ORDER — ISOSORBIDE MONONITRATE ER 120 MG PO TB24: 120.0000 mg | ORAL_TABLET | Freq: Every day | ORAL | Status: DC

## 2014-07-24 NOTE — Telephone Encounter (Signed)
pt aware of Dr.Smith instructions to have bmet drawn today. pt will come to the office for lab this afternoon. pt sts that he has not resume metformin, but he did take Ibersartan this morning, pt adv to hold both medications untio instructed to resume. Pt verbalized understanding

## 2014-07-24 NOTE — H&P (Signed)
[place] [address], [address] [place], [place]  [address] Phone: [phone] Fax:  [phone]  Date:  07/23/2014  ID:  CALVERT CHARLAND, DOB 04-02-1941, MRN [id number]  PCP:  Shirline Frees, MD   ASSESSMENT:  1. Coronary artery disease with recent repeat bypass surgery and now class III angina pectoris. 2. Diabetes mellitus with multiple complications 3. Chronic kidney disease, III-IV 4. Chronic diastolic heart failure 5. Hypertension  PLAN:  1. Because of recurrent angina and concern for early graft occlusion, the patient will undergo diagnostic coronary angiography after hydration, holding ARB therapy and diuretic therapy, and holding metformin. 2. The procedure and risks including kidney failure, stroke, death, myocardial infarction, we discussed in detail with the patient.   SUBJECTIVE: [name] is a 73 y.o. male who complains of progressive in 4-6 week history of recurring episodes of angina similar to those that occurred prior to repeat bypass surgery. Episodes have been relieved by sublingual nitroglycerin. This occurred with exertion and at rest.   Wt Readings from Last 3 Encounters:  07/23/14 165 lb (74.844 kg)  07/23/14 165 lb (74.844 kg)  06/27/14 170 lb (77.111 kg)     Past Medical History  Diagnosis Date  . Carotid artery occlusion     Bilateral 50-69% stenoses of the internal carotid arteries. He needs carotid Dopplers every 6 months.  . Hyperlipidemia   . Atherosclerosis of native arteries of the extremities with intermittent claudication Left external iliac artery stent in 2001    Bilateral SFA occlusions documented in 2001.  Marland Kitchen OSA (obstructive sleep apnea)   . Hypertension   . GERD (gastroesophageal reflux disease)   . MI (myocardial infarction)     Prior inferior myocardial infarction 1999  . COPD (chronic obstructive pulmonary disease) 09/24/2013  . Diabetes mellitus with complication 62/05/3661  . 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 Esophageal stricture 09/24/2013    History of dilatation. Long-standing history of esophageal reflux   . 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 1999 Repeat CABG 2015 with SVG to D1, SVG to PDA, and SVG to OM.   Marland Kitchen Chronic diastolic heart failure 06/21/7653  . CKD (chronic kidney disease) stage 3, GFR 30-59 ml/min 02/26/2014  . CAD (coronary artery disease), native coronary artery 09/24/2013    Native circumflex Taxus stent 2007, angioplasty of the SVG to native right coronary 2006, DES stent to the SVG of RCA 2007 and resplinted 2009  CABG in 1999. Stent in SVG to RCA in 6503 complicated by no reflow     No current facility-administered medications for this encounter.   Current Outpatient Prescriptions  Medication Sig Dispense Refill  . amLODipine (NORVASC) 10 MG tablet Take 10 mg by mouth daily.      Marland Kitchen aspirin EC 325 MG tablet Take 325 mg by mouth daily.      Marland Kitchen atorvastatin (LIPITOR) 20 MG tablet Take 20 mg by mouth daily.      . cholecalciferol (VITAMIN D) 1000 UNITS tablet Take 1,000 Units by mouth daily.      . furosemide (LASIX) 40 MG tablet Take 40 mg by mouth daily.      Marland Kitchen glipiZIDE (GLUCOTROL) 10 MG tablet Take 10 mg by mouth daily before breakfast.      . irbesartan (AVAPRO)  300 MG tablet Take 0.5 tablets (150 mg total) by mouth daily.      . isosorbide mononitrate (IMDUR) 60 MG 24 hr tablet Take 1 tablet (60 mg total) by mouth daily.  30 tablet  5  . metFORMIN (GLUCOPHAGE) 1000 MG tablet Take 1,000 mg by mouth 2 (two) times daily with a meal.      . metoprolol (LOPRESSOR) 50 MG tablet Take 50 mg by mouth 2 (two) times daily.      . nitroGLYCERIN (NITROSTAT) 0.4 MG SL tablet Place 1 tablet (0.4 mg total) under the tongue every 5 (five) minutes as needed for chest pain.  25 tablet  3  . omeprazole (PRILOSEC) 20 MG capsule  Take 20 mg by mouth daily.      Marland Kitchen acetaminophen (TYLENOL) 325 MG tablet Take 650 mg by mouth every 6 (six) hours as needed for moderate pain.        Allergies:   No Known Allergies  Social History:  The patient  reports that he quit smoking about 4 months ago. He has never used smokeless tobacco. He reports that he does not drink alcohol or use illicit drugs.   ROS:  Please see the history of present illness.   Has discontinued smoking. No edema. No syncope. Denies orthopnea.   All other systems reviewed and negative.   OBJECTIVE: VS:  BP 186/67  Pulse 61  Temp(Src) 97.5 F (36.4 C) (Oral)  Resp 18  Ht 5\' 6"  (1.676 m)  Wt 165 lb (74.844 kg)  BMI 26.64 kg/m2  SpO2 96% Well nourished, well developed, in no acute distress, elderly appearing HEENT: normal Neck: JVD flat. Carotid bruit absent  Cardiac:  normal S1, S2; RRR; no murmur Lungs:  clear to auscultation bilaterally, no wheezing, rhonchi or rales Abd: soft, nontender, no hepatomegaly Ext: Edema absent. Pulses 2+ bilateral Skin: warm and dry Neuro:  CNs 2-12 intact, no focal abnormalities noted  EKG:  No acute change       Signed, Illene Labrador III, MD 07/24/2014 7:40 AM

## 2014-07-24 NOTE — Telephone Encounter (Signed)
Follow up  ° ° ° °Returning call back to nurse  °

## 2014-07-24 NOTE — Telephone Encounter (Signed)
detailed message left on b[facility] phones listed for pt to call back. pt needs a bmet today. pt is not to resume metformin or avapro until instructed

## 2014-07-25 NOTE — Telephone Encounter (Signed)
Message copied by Lamar Laundry on Thu Jul 25, 2014  8:21 AM ------      Message from: Daneen Schick      Created: Thu Jul 25, 2014  8:08 AM       Resume  meds as before cath. ------

## 2014-07-25 NOTE — Telephone Encounter (Signed)
New Message  Pt requests a call back says he had some servere pain in his chest last night. (No pains currently) requests a call back

## 2014-07-25 NOTE — Telephone Encounter (Signed)
pt given lab results and Dr.Smith instructions.Resume  meds as before cath.pt sts that last night he has a episode of chest discomfort that was not relieved with 4 nitro. pt sts that he called the oncall service and was adv to take an additional 60mg  of Imdur. Pt currently has 60mg  tabs. Pt Imdur was increased to 120mg  daily by Dr.Smith on 9/5. Pt had not started the higher dose. Pt adv to pick up and start Imdur 120mg  daily.pt to take sub nitro for break through chest discomfort. Pt verbalized understanding.

## 2014-08-15 DIAGNOSIS — H18332 Rupture in Descemet's membrane, left eye: Secondary | ICD-10-CM | POA: Diagnosis not present

## 2014-08-15 DIAGNOSIS — H18232 Secondary corneal edema, left eye: Secondary | ICD-10-CM | POA: Diagnosis not present

## 2014-08-21 DIAGNOSIS — N312 Flaccid neuropathic bladder, not elsewhere classified: Secondary | ICD-10-CM | POA: Diagnosis not present

## 2014-08-21 DIAGNOSIS — E119 Type 2 diabetes mellitus without complications: Secondary | ICD-10-CM | POA: Diagnosis not present

## 2014-08-21 DIAGNOSIS — R8299 Other abnormal findings in urine: Secondary | ICD-10-CM | POA: Diagnosis not present

## 2014-08-21 DIAGNOSIS — K219 Gastro-esophageal reflux disease without esophagitis: Secondary | ICD-10-CM | POA: Diagnosis not present

## 2014-08-21 DIAGNOSIS — R6883 Chills (without fever): Secondary | ICD-10-CM | POA: Diagnosis not present

## 2014-08-21 DIAGNOSIS — M546 Pain in thoracic spine: Secondary | ICD-10-CM | POA: Diagnosis not present

## 2014-08-21 DIAGNOSIS — I6529 Occlusion and stenosis of unspecified carotid artery: Secondary | ICD-10-CM | POA: Diagnosis not present

## 2014-08-21 DIAGNOSIS — E780 Pure hypercholesterolemia: Secondary | ICD-10-CM | POA: Diagnosis not present

## 2014-08-21 DIAGNOSIS — N183 Chronic kidney disease, stage 3 (moderate): Secondary | ICD-10-CM | POA: Diagnosis not present

## 2014-08-21 DIAGNOSIS — I25119 Atherosclerotic heart disease of native coronary artery with unspecified angina pectoris: Secondary | ICD-10-CM | POA: Diagnosis not present

## 2014-08-21 DIAGNOSIS — R339 Retention of urine, unspecified: Secondary | ICD-10-CM | POA: Diagnosis not present

## 2014-08-26 ENCOUNTER — Inpatient Hospital Stay (HOSPITAL_COMMUNITY)
Admission: EM | Admit: 2014-08-26 | Discharge: 2014-08-28 | DRG: 292 | Disposition: A | Discharge status: Home / Self Care | Payer: Medicare Other | Attending: Cardiovascular Disease | Admitting: Cardiovascular Disease

## 2014-08-26 ENCOUNTER — Encounter (HOSPITAL_COMMUNITY): Payer: Self-pay

## 2014-08-26 ENCOUNTER — Emergency Department (HOSPITAL_COMMUNITY): Payer: Medicare Other

## 2014-08-26 DIAGNOSIS — E1151 Type 2 diabetes mellitus with diabetic peripheral angiopathy without gangrene: Secondary | ICD-10-CM | POA: Diagnosis present

## 2014-08-26 DIAGNOSIS — I5031 Acute diastolic (congestive) heart failure: Secondary | ICD-10-CM | POA: Diagnosis not present

## 2014-08-26 DIAGNOSIS — R0902 Hypoxemia: Secondary | ICD-10-CM | POA: Diagnosis present

## 2014-08-26 DIAGNOSIS — I10 Essential (primary) hypertension: Secondary | ICD-10-CM | POA: Diagnosis not present

## 2014-08-26 DIAGNOSIS — Z7952 Long term (current) use of systemic steroids: Secondary | ICD-10-CM | POA: Diagnosis not present

## 2014-08-26 DIAGNOSIS — J449 Chronic obstructive pulmonary disease, unspecified: Secondary | ICD-10-CM | POA: Diagnosis present

## 2014-08-26 DIAGNOSIS — D649 Anemia, unspecified: Secondary | ICD-10-CM | POA: Diagnosis present

## 2014-08-26 DIAGNOSIS — Z8249 Family history of ischemic heart disease and other diseases of the circulatory system: Secondary | ICD-10-CM

## 2014-08-26 DIAGNOSIS — I2510 Atherosclerotic heart disease of native coronary artery without angina pectoris: Secondary | ICD-10-CM | POA: Diagnosis present

## 2014-08-26 DIAGNOSIS — T380X5A Adverse effect of glucocorticoids and synthetic analogues, initial encounter: Secondary | ICD-10-CM | POA: Diagnosis present

## 2014-08-26 DIAGNOSIS — Z7982 Long term (current) use of aspirin: Secondary | ICD-10-CM | POA: Diagnosis not present

## 2014-08-26 DIAGNOSIS — N183 Chronic kidney disease, stage 3 unspecified: Secondary | ICD-10-CM | POA: Diagnosis present

## 2014-08-26 DIAGNOSIS — K219 Gastro-esophageal reflux disease without esophagitis: Secondary | ICD-10-CM | POA: Diagnosis present

## 2014-08-26 DIAGNOSIS — I509 Heart failure, unspecified: Secondary | ICD-10-CM

## 2014-08-26 DIAGNOSIS — G4733 Obstructive sleep apnea (adult) (pediatric): Secondary | ICD-10-CM | POA: Diagnosis present

## 2014-08-26 DIAGNOSIS — I252 Old myocardial infarction: Secondary | ICD-10-CM

## 2014-08-26 DIAGNOSIS — I110 Hypertensive heart disease with heart failure: Secondary | ICD-10-CM | POA: Diagnosis present

## 2014-08-26 DIAGNOSIS — E1121 Type 2 diabetes mellitus with diabetic nephropathy: Secondary | ICD-10-CM | POA: Diagnosis present

## 2014-08-26 DIAGNOSIS — I70213 Atherosclerosis of native arteries of extremities with intermittent claudication, bilateral legs: Secondary | ICD-10-CM | POA: Diagnosis present

## 2014-08-26 DIAGNOSIS — Z955 Presence of coronary angioplasty implant and graft: Secondary | ICD-10-CM | POA: Diagnosis not present

## 2014-08-26 DIAGNOSIS — I5033 Acute on chronic diastolic (congestive) heart failure: Secondary | ICD-10-CM | POA: Diagnosis not present

## 2014-08-26 DIAGNOSIS — E785 Hyperlipidemia, unspecified: Secondary | ICD-10-CM | POA: Diagnosis present

## 2014-08-26 DIAGNOSIS — I25728 Atherosclerosis of autologous artery coronary artery bypass graft(s) with other forms of angina pectoris: Secondary | ICD-10-CM | POA: Diagnosis present

## 2014-08-26 DIAGNOSIS — Z951 Presence of aortocoronary bypass graft: Secondary | ICD-10-CM

## 2014-08-26 DIAGNOSIS — E1159 Type 2 diabetes mellitus with other circulatory complications: Secondary | ICD-10-CM | POA: Diagnosis present

## 2014-08-26 DIAGNOSIS — N312 Flaccid neuropathic bladder, not elsewhere classified: Secondary | ICD-10-CM | POA: Diagnosis present

## 2014-08-26 DIAGNOSIS — Z79899 Other long term (current) drug therapy: Secondary | ICD-10-CM | POA: Diagnosis not present

## 2014-08-26 DIAGNOSIS — Z87891 Personal history of nicotine dependence: Secondary | ICD-10-CM | POA: Diagnosis not present

## 2014-08-26 DIAGNOSIS — I25709 Atherosclerosis of coronary artery bypass graft(s), unspecified, with unspecified angina pectoris: Secondary | ICD-10-CM | POA: Diagnosis present

## 2014-08-26 DIAGNOSIS — R0602 Shortness of breath: Secondary | ICD-10-CM | POA: Diagnosis not present

## 2014-08-26 DIAGNOSIS — M353 Polymyalgia rheumatica: Secondary | ICD-10-CM | POA: Diagnosis present

## 2014-08-26 DIAGNOSIS — I059 Rheumatic mitral valve disease, unspecified: Secondary | ICD-10-CM | POA: Diagnosis not present

## 2014-08-26 DIAGNOSIS — R0600 Dyspnea, unspecified: Secondary | ICD-10-CM | POA: Diagnosis not present

## 2014-08-26 HISTORY — DX: Other chronic pain: G89.29

## 2014-08-26 HISTORY — DX: Angina pectoris, unspecified: I20.9

## 2014-08-26 HISTORY — DX: Unspecified osteoarthritis, unspecified site: M19.90

## 2014-08-26 HISTORY — DX: Headache: R51

## 2014-08-26 HISTORY — DX: Unspecified malignant neoplasm of skin, unspecified: C44.90

## 2014-08-26 HISTORY — DX: Anemia, unspecified: D64.9

## 2014-08-26 HISTORY — DX: Low back pain: M54.5

## 2014-08-26 HISTORY — DX: Headache, unspecified: R51.9

## 2014-08-26 HISTORY — DX: Type 2 diabetes mellitus without complications: E11.9

## 2014-08-26 HISTORY — DX: Personal history of other diseases of the digestive system: Z87.19

## 2014-08-26 LAB — BASIC METABOLIC PANEL
Anion gap: 17 — ABNORMAL HIGH (ref 5–15)
BUN: 35 mg/dL — AB (ref 6–23)
CHLORIDE: 102 meq/L (ref 96–112)
CO2: 21 mEq/L (ref 19–32)
CREATININE: 1.53 mg/dL — AB (ref 0.50–1.35)
Calcium: 9.4 mg/dL (ref 8.4–10.5)
GFR calc non Af Amer: 44 mL/min — ABNORMAL LOW (ref >90)
GFR, EST AFRICAN AMERICAN: 51 mL/min — AB (ref >90)
GLUCOSE: 306 mg/dL — AB (ref 70–99)
Potassium: 4.5 mEq/L (ref 3.7–5.3)
Sodium: 140 mEq/L (ref 137–147)

## 2014-08-26 LAB — GLUCOSE, CAPILLARY
GLUCOSE-CAPILLARY: 150 mg/dL — AB (ref 70–99)
Glucose-Capillary: 172 mg/dL — ABNORMAL HIGH (ref 70–99)

## 2014-08-26 LAB — CBC
HCT: 31.8 % — ABNORMAL LOW (ref 39.0–52.0)
HEMATOCRIT: 31.8 % — AB (ref 39.0–52.0)
HEMOGLOBIN: 10.1 g/dL — AB (ref 13.0–17.0)
Hemoglobin: 10.1 g/dL — ABNORMAL LOW (ref 13.0–17.0)
MCH: 24 pg — ABNORMAL LOW (ref 26.0–34.0)
MCH: 23.9 pg — ABNORMAL LOW (ref 26.0–34.0)
MCHC: 31.8 g/dL (ref 30.0–36.0)
MCHC: 31.8 g/dL (ref 30.0–36.0)
MCV: 75.2 fL — ABNORMAL LOW (ref 78.0–100.0)
MCV: 75.5 fL — AB (ref 78.0–100.0)
Platelets: 438 10*3/uL — ABNORMAL HIGH (ref 150–400)
Platelets: 410 10*3/uL — ABNORMAL HIGH (ref 150–400)
RBC: 4.21 MIL/uL — ABNORMAL LOW (ref 4.22–5.81)
RBC: 4.23 MIL/uL (ref 4.22–5.81)
RDW: 16.2 % — AB (ref 11.5–15.5)
RDW: 16.3 % — AB (ref 11.5–15.5)
WBC: 10.7 10*3/uL — ABNORMAL HIGH (ref 4.0–10.5)
WBC: 11.5 10*3/uL — AB (ref 4.0–10.5)

## 2014-08-26 LAB — URINE MICROSCOPIC-ADD ON

## 2014-08-26 LAB — URINALYSIS, ROUTINE W REFLEX MICROSCOPIC
Bilirubin Urine: NEGATIVE
Glucose, UA: >1000 mg/dL — AB
Ketones, ur: NEGATIVE mg/dL
Leukocytes, UA: NEGATIVE
NITRITE: NEGATIVE
Protein, ur: >300 mg/dL — AB
SPECIFIC GRAVITY, URINE: 1.022 (ref 1.005–1.030)
UROBILINOGEN UA: 0.2 mg/dL (ref 0.0–1.0)
pH: 5.5 (ref 5.0–8.0)

## 2014-08-26 LAB — COMPREHENSIVE METABOLIC PANEL
ALT: 48 U/L (ref 0–53)
AST: 23 U/L (ref 0–37)
Albumin: 2.8 g/dL — ABNORMAL LOW (ref 3.5–5.2)
Alkaline Phosphatase: 75 U/L (ref 39–117)
Anion gap: 14 (ref 5–15)
BUN: 35 mg/dL — ABNORMAL HIGH (ref 6–23)
CO2: 26 mEq/L (ref 19–32)
CREATININE: 1.38 mg/dL — AB (ref 0.50–1.35)
Calcium: 9.6 mg/dL (ref 8.4–10.5)
Chloride: 105 mEq/L (ref 96–112)
GFR calc Af Amer: 57 mL/min — ABNORMAL LOW (ref >90)
GFR calc non Af Amer: 50 mL/min — ABNORMAL LOW (ref >90)
Glucose, Bld: 54 mg/dL — ABNORMAL LOW (ref 70–99)
Potassium: 4.1 mEq/L (ref 3.7–5.3)
Sodium: 145 mEq/L (ref 137–147)
TOTAL PROTEIN: 6.9 g/dL (ref 6.0–8.3)
Total Bilirubin: <0.2 mg/dL — ABNORMAL LOW (ref 0.3–1.2)

## 2014-08-26 LAB — TSH: TSH: 3.59 u[IU]/mL (ref 0.350–4.500)

## 2014-08-26 LAB — PRO B NATRIURETIC PEPTIDE: Pro B Natriuretic peptide (BNP): 5264 pg/mL — ABNORMAL HIGH (ref 0–125)

## 2014-08-26 LAB — TROPONIN I: Troponin I: <0.3 ng/mL (ref <0.30)

## 2014-08-26 LAB — I-STAT TROPONIN, ED: TROPONIN I, POC: 0.02 ng/mL (ref 0.00–0.08)

## 2014-08-26 MED ORDER — ENOXAPARIN SODIUM 40 MG/0.4ML ~~LOC~~ SOLN
40.0000 mg | SUBCUTANEOUS | Status: DC
  Administered 2014-08-26 – 2014-08-28 (×3): 40 mg via SUBCUTANEOUS
  Filled 2014-08-26 (×3): qty 0.4

## 2014-08-26 MED ORDER — AMLODIPINE BESYLATE 10 MG PO TABS
10.0000 mg | ORAL_TABLET | Freq: Every day | ORAL | Status: DC
  Administered 2014-08-26 – 2014-08-28 (×3): 10 mg via ORAL
  Filled 2014-08-26: qty 1
  Filled 2014-08-26: qty 2
  Filled 2014-08-26: qty 1

## 2014-08-26 MED ORDER — ATORVASTATIN CALCIUM 20 MG PO TABS
20.0000 mg | ORAL_TABLET | Freq: Every day | ORAL | Status: DC
  Administered 2014-08-26 – 2014-08-28 (×3): 20 mg via ORAL
  Filled 2014-08-26 (×3): qty 1

## 2014-08-26 MED ORDER — ACETAMINOPHEN 325 MG PO TABS
650.0000 mg | ORAL_TABLET | Freq: Four times a day (QID) | ORAL | Status: DC | PRN
  Filled 2014-08-26: qty 2

## 2014-08-26 MED ORDER — ACETAMINOPHEN 325 MG PO TABS
650.0000 mg | ORAL_TABLET | ORAL | Status: DC | PRN
Start: 2014-08-26 — End: 2014-08-28
  Administered 2014-08-27: 650 mg via ORAL

## 2014-08-26 MED ORDER — ALBUTEROL SULFATE (2.5 MG/3ML) 0.083% IN NEBU
2.5000 mg | INHALATION_SOLUTION | Freq: Once | RESPIRATORY_TRACT | Status: AC
Start: 2014-08-26 — End: 2014-08-26
  Administered 2014-08-26: 2.5 mg via RESPIRATORY_TRACT

## 2014-08-26 MED ORDER — IRBESARTAN 150 MG PO TABS
150.0000 mg | ORAL_TABLET | Freq: Every day | ORAL | Status: DC
  Administered 2014-08-26 – 2014-08-28 (×3): 150 mg via ORAL
  Filled 2014-08-26 (×5): qty 1

## 2014-08-26 MED ORDER — NITROGLYCERIN 0.4 MG SL SUBL: 0.4000 mg | SUBLINGUAL_TABLET | SUBLINGUAL | Status: DC | PRN

## 2014-08-26 MED ORDER — METOPROLOL TARTRATE 50 MG PO TABS
50.0000 mg | ORAL_TABLET | Freq: Two times a day (BID) | ORAL | Status: DC
  Administered 2014-08-26 – 2014-08-28 (×5): 50 mg via ORAL
  Filled 2014-08-26 (×3): qty 1
  Filled 2014-08-26: qty 2
  Filled 2014-08-26 (×2): qty 1

## 2014-08-26 MED ORDER — FUROSEMIDE 10 MG/ML IJ SOLN
40.0000 mg | Freq: Once | INTRAMUSCULAR | Status: AC
  Administered 2014-08-26: 40 mg via INTRAVENOUS
  Filled 2014-08-26: qty 4

## 2014-08-26 MED ORDER — INSULIN ASPART 100 UNIT/ML ~~LOC~~ SOLN
0.0000 [IU] | Freq: Three times a day (TID) | SUBCUTANEOUS | Status: DC
  Administered 2014-08-26: 1 [IU] via SUBCUTANEOUS
  Administered 2014-08-27: 5 [IU] via SUBCUTANEOUS
  Administered 2014-08-27: 1 [IU] via SUBCUTANEOUS
  Administered 2014-08-27: 7 [IU] via SUBCUTANEOUS
  Administered 2014-08-28: 1 [IU] via SUBCUTANEOUS

## 2014-08-26 MED ORDER — ASPIRIN EC 325 MG PO TBEC
325.0000 mg | DELAYED_RELEASE_TABLET | Freq: Every day | ORAL | Status: DC
  Administered 2014-08-26 – 2014-08-28 (×3): 325 mg via ORAL
  Filled 2014-08-26 (×3): qty 1

## 2014-08-26 MED ORDER — ALBUTEROL SULFATE (2.5 MG/3ML) 0.083% IN NEBU
INHALATION_SOLUTION | RESPIRATORY_TRACT | Status: AC
  Filled 2014-08-26: qty 3

## 2014-08-26 MED ORDER — SODIUM CHLORIDE 0.9 % IJ SOLN
3.0000 mL | Freq: Two times a day (BID) | INTRAMUSCULAR | Status: DC
  Administered 2014-08-26 – 2014-08-28 (×5): 3 mL via INTRAVENOUS

## 2014-08-26 MED ORDER — ISOSORBIDE MONONITRATE ER 60 MG PO TB24
120.0000 mg | ORAL_TABLET | Freq: Every day | ORAL | Status: DC
  Administered 2014-08-26 – 2014-08-28 (×3): 120 mg via ORAL
  Filled 2014-08-26 (×3): qty 2

## 2014-08-26 MED ORDER — NITROGLYCERIN 0.4 MG SL SUBL
0.4000 mg | SUBLINGUAL_TABLET | SUBLINGUAL | Status: DC | PRN
  Filled 2014-08-26: qty 1

## 2014-08-26 MED ORDER — GLIPIZIDE 10 MG PO TABS
10.0000 mg | ORAL_TABLET | Freq: Every day | ORAL | Status: DC
  Administered 2014-08-27 – 2014-08-28 (×2): 10 mg via ORAL
  Filled 2014-08-26 (×3): qty 1

## 2014-08-26 MED ORDER — FUROSEMIDE 10 MG/ML IJ SOLN
40.0000 mg | Freq: Two times a day (BID) | INTRAMUSCULAR | Status: DC
  Administered 2014-08-26 – 2014-08-28 (×5): 40 mg via INTRAVENOUS
  Filled 2014-08-26 (×5): qty 4

## 2014-08-26 MED ORDER — HYDRALAZINE HCL 25 MG PO TABS
25.0000 mg | ORAL_TABLET | Freq: Three times a day (TID) | ORAL | Status: DC
  Administered 2014-08-26 – 2014-08-27 (×3): 25 mg via ORAL
  Filled 2014-08-26 (×7): qty 1

## 2014-08-26 MED ORDER — PANTOPRAZOLE SODIUM 40 MG PO TBEC
40.0000 mg | DELAYED_RELEASE_TABLET | Freq: Every day | ORAL | Status: DC
  Administered 2014-08-26 – 2014-08-28 (×3): 40 mg via ORAL
  Filled 2014-08-26 (×3): qty 1

## 2014-08-26 MED ORDER — VITAMIN D3 25 MCG (1000 UNIT) PO TABS
1000.0000 [IU] | ORAL_TABLET | Freq: Every day | ORAL | Status: DC
  Administered 2014-08-26 – 2014-08-28 (×3): 1000 [IU] via ORAL
  Filled 2014-08-26 (×3): qty 1

## 2014-08-26 MED ORDER — SODIUM CHLORIDE 0.9 % IV SOLN: 250.0000 mL | INTRAVENOUS | Status: DC | PRN

## 2014-08-26 MED ORDER — PREDNISONE 20 MG PO TABS
20.0000 mg | ORAL_TABLET | Freq: Every day | ORAL | Status: DC
  Administered 2014-08-27 – 2014-08-28 (×2): 20 mg via ORAL
  Filled 2014-08-26 (×3): qty 1

## 2014-08-26 MED ORDER — INSULIN ASPART 100 UNIT/ML ~~LOC~~ SOLN
0.0000 [IU] | Freq: Every day | SUBCUTANEOUS | Status: DC
  Administered 2014-08-27: 3 [IU] via SUBCUTANEOUS

## 2014-08-26 MED ORDER — SODIUM CHLORIDE 0.9 % IJ SOLN: 3.0000 mL | INTRAMUSCULAR | Status: DC | PRN

## 2014-08-26 MED ORDER — ONDANSETRON HCL 4 MG/2ML IJ SOLN: 4.0000 mg | Freq: Four times a day (QID) | INTRAMUSCULAR | Status: DC | PRN

## 2014-08-26 NOTE — ED Provider Notes (Signed)
CSN: [id number]     Arrival date & time 08/26/14  0037 History   First MD Initiated Contact with Patient 08/26/14 0102     Chief Complaint  Patient presents with  . Shortness of Breath     (Consider location/radiation/quality/duration/timing/severity/associated sxs/prior Treatment) HPI  [name] is a 73 y.o. male with past medical history of hyperlipidemia, obstructive sleep apnea, CHF, COPD, diabetes, chronic kidney disease presenting today with shortness of breath. Patient states for the past 3 days she's had worsening sleep orthopnea which got severe tonight. He had chest pain shortness of breath and took a nitroglycerin. His symptoms are worse when he lays flat.the nitroglycerin did not relieve his symptoms. He describes a 4 pound increase over 24 hours. Because of this he doubled his Lasix without any relief. Patient also describes the sensation of fluid on his lungs and worsening dyspnea on exertion. He was given aspirin by EMS. Denies any recent infections, productive cough, fevers. Patient has no further complaints.    10 Systems reviewed and are negative for acute change except as noted in the HPI.     Past Medical History  Diagnosis Date  . Carotid artery occlusion     Bilateral 50-69% stenoses of the internal carotid arteries. He needs carotid Dopplers every 6 months.  . Hyperlipidemia   . Atherosclerosis of native arteries of the extremities with intermittent claudication Left external iliac artery stent in 2001    Bilateral SFA occlusions documented in 2001.  Marland Kitchen OSA (obstructive sleep apnea)   . Hypertension   . GERD (gastroesophageal reflux disease)   . MI (myocardial infarction)     Prior inferior myocardial infarction 1999  . COPD (chronic obstructive pulmonary disease) 09/24/2013  . Diabetes mellitus with complication 28/12/1515  . 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 Esophageal stricture  09/24/2013    History of dilatation. Long-standing history of esophageal reflux   . 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 1999 Repeat CABG 2015 with SVG to D1, SVG to PDA, and SVG to OM.   Marland Kitchen Chronic diastolic heart failure 03/18/6072  . CKD (chronic kidney disease) stage 3, GFR 30-59 ml/min 02/26/2014  . CAD (coronary artery disease), native coronary artery 09/24/2013    Native circumflex Taxus stent 2007, angioplasty of the SVG to native right coronary 2006, DES stent to the SVG of RCA 2007 and resplinted 2009  CABG in 1999. Stent in SVG to RCA in 7106 complicated by no reflow    Past Surgical History  Procedure Laterality Date  . Coronary stent placement    . Coronary artery bypass graft  1999  . Cardiac catheterization    . Coronary artery bypass graft N/A 03/01/2014    Procedure: REDO CORONARY ARTERY BYPASS GRAFTING TIMES THREE, USING RIGHT GREATER SAPHENOUS VEIN VIA ENDOVEIN HARVEST.;  Surgeon: Gaye Pollack, MD;  Location: [facility] OR;  Service: Open Heart Surgery;  Laterality: N/A;  . Intraoperative transesophageal echocardiogram N/A 03/01/2014    Procedure: INTRAOPERATIVE TRANSESOPHAGEAL ECHOCARDIOGRAM;  Surgeon: Gaye Pollack, MD;  Location: [facility] OR;  Service: Open Heart Surgery;  Laterality: N/A;   Family History  Problem Relation Age of Onset  . CAD Mother   . Cirrhosis Brother    History  Substance Use Topics  . Smoking status: Former Smoker --  1.00 packs/day for 50 years    Quit date: 02/25/2014  . Smokeless tobacco: Never Used     Comment: QUIT ON SEPTEMBER 2014  . Alcohol Use: No    Review of Systems    Allergies  Review of patient's allergies indicates no known allergies.  Home Medications   Prior to Admission medications   Medication Sig Start Date End Date Taking? Authorizing Provider  acetaminophen (TYLENOL) 325 MG tablet Take 650 mg by mouth every 6 (six) hours  as needed for moderate pain.    Historical Provider, MD  amLODipine (NORVASC) 10 MG tablet Take 10 mg by mouth daily.    Historical Provider, MD  aspirin EC 325 MG tablet Take 325 mg by mouth daily.    Historical Provider, MD  atorvastatin (LIPITOR) 20 MG tablet Take 20 mg by mouth daily.    Historical Provider, MD  cholecalciferol (VITAMIN D) 1000 UNITS tablet Take 1,000 Units by mouth daily.    Historical Provider, MD  furosemide (LASIX) 40 MG tablet Take 40 mg by mouth daily. 03/07/14   Arlyn Leak Collins, PA-C  glipiZIDE (GLUCOTROL) 10 MG tablet Take 10 mg by mouth daily before breakfast.    Historical Provider, MD  irbesartan (AVAPRO) 300 MG tablet Take 0.5 tablets (150 mg total) by mouth daily. 04/22/14   Belva Crome III, MD  isosorbide mononitrate (IMDUR) 120 MG 24 hr tablet Take 1 tablet (120 mg total) by mouth daily. 07/24/14   Belva Crome III, MD  metFORMIN (GLUCOPHAGE) 1000 MG tablet Take 1,000 mg by mouth 2 (two) times daily with a meal.    Historical Provider, MD  metoprolol (LOPRESSOR) 50 MG tablet Take 50 mg by mouth 2 (two) times daily.    Historical Provider, MD  nitroGLYCERIN (NITROSTAT) 0.4 MG SL tablet Place 1 tablet (0.4 mg total) under the tongue every 5 (five) minutes as needed for chest pain. 06/27/14   Belva Crome III, MD  omeprazole (PRILOSEC) 20 MG capsule Take 20 mg by mouth daily.    Historical Provider, MD   BP 169/62 mmHg  Pulse 72  Temp(Src) 97.9 F (36.6 C) (Oral)  Resp 13  SpO2 99% Physical Exam  Constitutional: He is oriented to person, place, and time. Vital signs are normal. He appears well-developed and well-nourished.  Non-toxic appearance. He does not appear ill. He appears distressed.  HENT:  Head: Normocephalic and atraumatic.  Eyes: Conjunctivae and EOM are normal. Pupils are equal, round, and reactive to light. No scleral icterus.  Neck: Normal range of motion. Neck supple. No tracheal deviation, no edema, no erythema and normal range of motion  present. No thyroid mass and no thyromegaly present.  Cardiovascular: Normal rate, regular rhythm, S1 normal, S2 normal, normal heart sounds, intact distal pulses and normal pulses.  Exam reveals no gallop and no friction rub.   No murmur heard. Pulses:      Radial pulses are 2+ on the right side, and 2+ on the left side.       Dorsalis pedis pulses are 2+ on the right side, and 2+ on the left side.  Pulmonary/Chest: He is in respiratory distress. He has wheezes. He has no rhonchi. He has rales.  Tachypnea and increased work of breathing noted.  Abdominal: Soft. Normal appearance and bowel sounds are normal. He exhibits no distension, no ascites and no mass. There is no hepatosplenomegaly. There is no tenderness. There is no rebound, no guarding and no CVA tenderness.  Musculoskeletal: Normal  range of motion. He exhibits no edema or tenderness.  Lymphadenopathy:    He has no cervical adenopathy.  Neurological: He is alert and oriented to person, place, and time. He has normal strength. No cranial nerve deficit or sensory deficit. He exhibits normal muscle tone. GCS eye subscore is 4. GCS verbal subscore is 5. GCS motor subscore is 6.  Skin: Skin is warm, dry and intact. No petechiae and no rash noted. He is not diaphoretic. No erythema. No pallor.  Psychiatric: He has a normal mood and affect. His behavior is normal. Judgment normal.  Nursing note and vitals reviewed.   ED Course  Procedures (including critical care time) Labs Review Labs Reviewed  CBC - Abnormal; Notable for the following:    WBC 10.7 (*)    RBC 4.21 (*)    Hemoglobin 10.1 (*)    HCT 31.8 (*)    MCV 75.5 (*)    MCH 24.0 (*)    RDW 16.2 (*)    Platelets 438 (*)    All other components within normal limits  BASIC METABOLIC PANEL - Abnormal; Notable for the following:    Glucose, Bld 306 (*)    BUN 35 (*)    Creatinine, Ser 1.53 (*)    GFR calc non Af Amer 44 (*)    GFR calc Af Amer 51 (*)    Anion gap 17 (*)     All other components within normal limits  PRO B NATRIURETIC PEPTIDE - Abnormal; Notable for the following:    Pro B Natriuretic peptide (BNP) 5264.0 (*)    All other components within normal limits  URINALYSIS, ROUTINE W REFLEX MICROSCOPIC  I-STAT TROPOININ, ED    Imaging Review Dg Chest 2 View  08/26/2014   CLINICAL DATA:  Dyspnea for 4 hr, worse with lying down.  EXAM: CHEST  2 VIEW  COMPARISON:  PA and lateral chest 04/03/2014. Single view of the chest 03/02/2014. CT chest 03/20/2013.  FINDINGS: The patient is status post CABG. Heart size is normal. No pneumothorax or pleural effusion is identified. Kerley B-lines in the lower chest are noted. No focal airspace disease is seen.  IMPRESSION: Kerley B-lines in the lower chest most in keeping with mild interstitial edema.   Electronically Signed   By: Inge Rise M.D.   On: 08/26/2014 01:39     EKG Interpretation None      MDM   Final diagnoses:  CHF exacerbation    Patient since emergency department for respiratory depression. He was immediately placed on oxygen. He is given a breathing treatment. My bedside ultrasound reveals B-lines concerning for fluid overload. He was given 40 mg IV Lasix and nitroglycerin was ordered. He is currently denying any chest pain. Because of initial hypoxia, patient will be admitted for continued treatment to cardiology service.  Emergency Ultrasound: Limited Thoracic Performed and interpreted by Dr Claudine Mouton Longitudinal view of anterior left and right lung fields in real-time with linear probe. Indication: respiratory distress Findings: positive lung sliding positive B lines Interpretation: no evidence of pneumothorax.         Everlene Balls, MD 08/26/14 [phone]

## 2014-08-26 NOTE — H&P (Signed)
History and Physical   Admit date: 08/26/2014 Name:  [name] Medical record number: [id number] DOB/Age:  1941/09/12  73 y.o. male  Referring Physician:   [facility]  Primary Cardiologist: Dr. Daneen Schick  Primary Physician:  Dr. Samara Snide  Chief complaint/reason for admission: Shortness of breath  HPI:  This 73 year old male has a history of coronary artery disease with previous inferior infarction. 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 He has previous redo bypass grafting in May of this year and developed worsening angina. His grafts were found to be patent in October but he had potential sites of ischemia in the distal circumflex and the LAD the were not felt to be good candidates for revascularization and he has been treated medically. His angina has improved with medical treatment. He recently developed severe jaw pain and generalized aching and was diagnosed with polymyalgia rheumatica. He was started on prednisone on November 6. He became mildly short of breath over the weekend and noted that he gained 4 pounds of weight today and took an extra Lasix. When he laid down this evening he developed significant PND and orthopnea and became acutely short of breath and was transported to the emergency room. He was found to be hypoxemic and was given intravenous Lasix and oxygen and improved. He is admitted at this time for treatment of acute congestive heart failure. His BNP was elevated tonight. He had some moderate chest pain earlier.    Past Medical History  Diagnosis Date  . Carotid artery occlusion     Bilateral 50-69% stenoses of the internal carotid arteries. He needs carotid Dopplers every 6 months.  . Hyperlipidemia   . Atherosclerosis of native arteries of the extremities with intermittent claudication Left external iliac artery stent in 2001    Bilateral SFA occlusions documented in 2001.  Marland Kitchen  OSA (obstructive sleep apnea)   . Hypertension   . GERD (gastroesophageal reflux disease)   . MI (myocardial infarction)     Prior inferior myocardial infarction 1999  . COPD (chronic obstructive pulmonary disease) 09/24/2013  . Diabetes mellitus with complication 50/12/5463  . 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 Esophageal stricture 09/24/2013    History of dilatation. Long-standing history of esophageal reflux   . 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 1999 Repeat CABG 2015 with SVG to D1, SVG to PDA, and SVG to OM.   Marland Kitchen Chronic diastolic heart failure 03/25/1274  . CKD (chronic kidney disease) stage 3, GFR 30-59 ml/min 02/26/2014  . CAD (coronary artery disease), native coronary artery 09/24/2013    Native circumflex Taxus stent 2007, angioplasty of the SVG to native right coronary 2006, DES stent to the SVG of RCA 2007 and resplinted 2009  CABG in 1999. Stent in SVG to RCA in 1700 complicated by no reflow      Past Surgical History  Procedure Laterality Date  . Coronary stent placement    . Coronary artery bypass graft  1999  . Cardiac catheterization    . Coronary artery bypass graft N/A 03/01/2014    Procedure: REDO CORONARY ARTERY BYPASS GRAFTING TIMES THREE, USING RIGHT GREATER SAPHENOUS VEIN VIA ENDOVEIN HARVEST.;  Surgeon: Gaye Pollack, MD;  Location: [facility];  Service:  Open Heart Surgery;  Laterality: N/A;  . Intraoperative transesophageal echocardiogram N/A 03/01/2014    Procedure: INTRAOPERATIVE TRANSESOPHAGEAL ECHOCARDIOGRAM;  Surgeon: Gaye Pollack, MD;  Location: [facility] OR;  Service: Open Heart Surgery;  Laterality: N/A;  . Cervical laminectomy    . Ankle fracture surgery    . Tonsillectomy     Allergies: has No Known Allergies.   Medications: Prior to Admission medications   Medication Sig Start Date End  Date Taking? Authorizing Provider  acetaminophen (TYLENOL) 325 MG tablet Take 650 mg by mouth every 6 (six) hours as needed for moderate pain.   Yes Historical Provider, MD  amLODipine (NORVASC) 10 MG tablet Take 10 mg by mouth daily.   Yes Historical Provider, MD  aspirin EC 325 MG tablet Take 325 mg by mouth daily.   Yes Historical Provider, MD  atorvastatin (LIPITOR) 20 MG tablet Take 20 mg by mouth daily.   Yes Historical Provider, MD  cholecalciferol (VITAMIN D) 1000 UNITS tablet Take 1,000 Units by mouth daily.   Yes Historical Provider, MD  furosemide (LASIX) 20 MG tablet Take 20 mg by mouth daily.   Yes Historical Provider, MD  glipiZIDE (GLUCOTROL) 10 MG tablet Take 10 mg by mouth daily before breakfast.   Yes Historical Provider, MD  irbesartan (AVAPRO) 300 MG tablet Take 0.5 tablets (150 mg total) by mouth daily. 04/22/14  Yes Belva Crome III, MD  isosorbide mononitrate (IMDUR) 120 MG 24 hr tablet Take 1 tablet (120 mg total) by mouth daily. 07/24/14  Yes Belva Crome III, MD  metFORMIN (GLUCOPHAGE) 1000 MG tablet Take 1,000 mg by mouth 2 (two) times daily with a meal.   Yes Historical Provider, MD  metoprolol (LOPRESSOR) 50 MG tablet Take 50 mg by mouth 2 (two) times daily.   Yes Historical Provider, MD  nitroGLYCERIN (NITROSTAT) 0.4 MG SL tablet Place 1 tablet (0.4 mg total) under the tongue every 5 (five) minutes as needed for chest pain. 06/27/14  Yes Belva Crome III, MD  omeprazole (PRILOSEC) 20 MG capsule Take 20 mg by mouth daily.   Yes Historical Provider, MD  predniSONE (DELTASONE) 20 MG tablet Take 20 mg by mouth daily with breakfast. Take for 14 days 08/23/14 09/06/14 Yes Historical Provider, MD  furosemide (LASIX) 40 MG tablet Take 40 mg by mouth daily. 03/07/14   Coolidge Breeze, PA-C    Family History:  Family Status  Relation Status Death Age  . Mother Deceased 46's    old age  . Father Deceased 78    old age  . Brother Deceased 5's    cirrhosis    Social  History:   reports that he quit smoking about 5 months ago. He has never used smokeless tobacco. He reports that he does not drink alcohol or use illicit drugs.   History   Social History Narrative   Tour manager          Review of Systems: He has a moderate amount of angina. He has some claudication in both legs when he walks. He has mild dyspnea with exertion. His urinary frequency as well as some nocturia. He has a hypotonic bladder and requires an intermittent catheterization at home. He has a history of esophageal stricture and long-standing esophageal reflux. Other than as noted above, the remainder of the review of systems is normal  Physical Exam: BP 184/77 mmHg  Pulse 70  Temp(Src) 97.9 F (36.6 C) (Oral)  Resp 15  SpO2 98% General appearance:  pleasant white male appearing in no acute distress Head: Normocephalic, without obvious abnormality, atraumatic Eyes: conjunctivae/corneas clear. PERRL, EOM's intact. Fundi benign. Neck: no adenopathy, no JVD, supple, symmetrical, trachea midline and soft bilateral carotid bruits Lungs: faint crackles at bases bilaterally Heart: regular rate and rhythm, S1, S2 normal, no murmur, click, rub or gallophealed median sternotomy scar Abdomen: soft, non-tender; bowel sounds normal; no masses,  no organomegaly Rectal: deferred Extremities: healed saphenous harvesting scars noted in the left leg, trace edema Pulses: 2+ and symmetric femoral pulses 2+ with bruits, pedal pulses 1+ Neurologic: Grossly normal  Labs: CBC  Recent Labs  08/26/14 0056  WBC 10.7*  RBC 4.21*  HGB 10.1*  HCT 31.8*  PLT 438*  MCV 75.5*  MCH 24.0*  MCHC 31.8  RDW 16.2*   CMP   Recent Labs  08/26/14 0056  NA 140  K 4.5  CL 102  CO2 21  GLUCOSE 306*  BUN 35*  CREATININE 1.53*  CALCIUM 9.4  GFRNONAA 44*  GFRAA 51*   BNP (last 3 results)  Recent Labs  09/24/13 0355 02/25/14 2302 08/26/14 0056  PROBNP 5410.0* 182.4* 5264.0*    Thyroid  Lab Results  Component Value Date   TSH 2.750 02/26/2014   EKG: Not done on admission Will order  Radiology: Cardiomegaly, curly B lines consistent with heart failure   IMPRESSIONS: 1. Acute diastolic congestive heart failure may be due to recent addition of prednisone with fluid retention 2. Coronary artery disease with recent redo bypass grafting with angina due to distal disease in the LAD and 3 marginal branches that are not revascularized but not candidates for intervention 3. Chronic diastolic heart failure 4. Stage III chronic kidney disease 5. Peripheral vascular disease 6. Hypertensive heart disease with blood pressure not well controlled 7. Recent diagnosis of polymyalgia rheumatica 8. 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 Follow serial electrolytes. Check serial troponins.  Signed: Kerry Hough MD [facility]  08/26/2014, 3:11 AM

## 2014-08-26 NOTE — ED Notes (Signed)
Per EMS: Dyspnea x4hrs, increases with lying. Began complaining of chest pain, took nitro x1 at home. Hx STEMI, htn. Currently denies pain. Decreased breath sounds. Pt reports 4lbs increase over last 2 days. EMS gave 325 ASA.

## 2014-08-26 NOTE — Progress Notes (Signed)
Subjective: Not in room- now back from echo- feels much better  Objective: Vital signs in last 24 hours: Temp:  [97.7 F (36.5 C)-97.9 F (36.6 C)] 97.7 F (36.5 C) (11/09 1123) Pulse Rate:  [50-83] 55 (11/09 1045) Resp:  [13-25] 18 (11/09 1123) BP: (140-191)/(43-77) 191/74 mmHg (11/09 1123) SpO2:  [97 %-100 %] 99 % (11/09 1123) Weight change:    Intake/Output from previous day:-1100   Intake/Output this shift: Total I/O In: -  Out: 1100 [Urine:1100]  PE: General:Pleasant affect, NAD Skin:Warm and dry, brisk capillary refill HEENT:normocephalic, sclera clear, mucus membranes moist Heart:S1S2 RRR without murmur, gallup, rub or click Lungs: with rales 1/3 up bil with decreased Breath sounds ,no  rhonchi, or wheezes QQP:YPPJ, non tender, + BS, do not palpate liver spleen or masses Ext:no lower ext edema, 2+ pedal pulses, 2+ radial pulses Neuro:alert and oriented, MAE, follows commands, + facial symmetry Tele:  SR at 72   Lab Results:  Recent Labs  08/26/14 0056 08/26/14 0650  WBC 10.7* 11.5*  HGB 10.1* 10.1*  HCT 31.8* 31.8*  PLT 438* 410*   BMET  Recent Labs  08/26/14 0056 08/26/14 0650  NA 140 145  K 4.5 4.1  CL 102 105  CO2 21 26  GLUCOSE 306* 54*  BUN 35* 35*  CREATININE 1.53* 1.38*  CALCIUM 9.4 9.6    Recent Labs  08/26/14 0332 08/26/14 0925  TROPONINI <0.30 <0.30    Lab Results  Component Value Date   CHOL 136 03/03/2011   HDL 34* 03/03/2011   LDLCALC  03/03/2011    75        Total Cholesterol/HDL:CHD Risk Coronary Heart Disease Risk Table                     Men   Women  1/2 Average Risk   3.4   3.3  Average Risk       5.0   4.4  2 X Average Risk   9.6   7.1  3 X Average Risk  23.4   11.0        Use the calculated Patient Ratio above and the CHD Risk Table to determine the patient's CHD Risk.        ATP III CLASSIFICATION (LDL):  <100     mg/dL   Optimal  100-129  mg/dL   Near or Above                     Optimal  130-159  mg/dL   Borderline  160-189  mg/dL   High  >190     mg/dL   Very High   TRIG 134 03/03/2011   CHOLHDL 4.0 03/03/2011   Lab Results  Component Value Date   HGBA1C 7.4* 02/26/2014     Lab Results  Component Value Date   TSH 3.590 08/26/2014    Hepatic Function Panel  Recent Labs  08/26/14 0650  PROT 6.9  ALBUMIN 2.8*  AST 23  ALT 48  ALKPHOS 75  BILITOT <0.2*   No results for input(s): CHOL in the last 72 hours. No results for input(s): PROTIME in the last 72 hours.     Studies/Results: Dg Chest 2 View  08/26/2014   CLINICAL DATA:  Dyspnea for 4 hr, worse with lying down.  EXAM: CHEST  2 VIEW  COMPARISON:  PA and lateral chest 04/03/2014. Single view of the chest 03/02/2014. CT chest 03/20/2013.  FINDINGS: The patient is status post CABG. Heart size is normal. No pneumothorax or pleural effusion is identified. Kerley B-lines in the lower chest are noted. No focal airspace disease is seen.  IMPRESSION: Kerley B-lines in the lower chest most in keeping with mild interstitial edema.   Electronically Signed   By: Inge Rise M.D.   On: 08/26/2014 01:39    Medications: I have reviewed the patient's current medications. Scheduled Meds: . albuterol      . amLODipine  10 mg Oral Daily  . aspirin EC  325 mg Oral Daily  . atorvastatin  20 mg Oral Daily  . cholecalciferol  1,000 Units Oral Daily  . enoxaparin (LOVENOX) injection  40 mg Subcutaneous Q24H  . furosemide  40 mg Intravenous Q12H  . [START ON 08/27/2014] glipiZIDE  10 mg Oral QAC breakfast  . irbesartan  150 mg Oral Daily  . isosorbide mononitrate  120 mg Oral Daily  . metoprolol  50 mg Oral BID  . pantoprazole  40 mg Oral Daily  . [START ON 08/27/2014] predniSONE  20 mg Oral Q breakfast  . sodium chloride  3 mL Intravenous Q12H   Continuous Infusions: . sodium chloride     PRN Meds:.sodium chloride, acetaminophen, acetaminophen, nitroGLYCERIN, ondansetron (ZOFRAN) IV, sodium  chloride  Assessment/Plan: 1. Acute diastolic congestive heart failure may be due to recent addition of prednisone with fluid retention -1100 since admit, pro BNP 5264- echo pending  2. Coronary artery disease with recent redo bypass grafting with angina due to distal disease in the LAD and 3 marginal branches that are not revascularized but not candidates for intervention. Neg troponin   3. Chronic diastolic heart failure  4. Stage III chronic kidney disease- cr 1.38 down from 1.53  5. Peripheral vascular disease with bil. Claudication and carotid artery disease-stable  6. Hypertensive heart disease with blood pressure not well controlled- back up to admit BPs  Will add hydralazine, with Cr. Would not increase  ARB  7. Recent diagnosis of polymyalgia rheumatica  8. 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 :15 minutes. [facility] R  Nurse Practitioner Certified Pager [phone] or after 5pm and on weekends call [phone] 08/26/2014, 12:41 PM   I have personally seen and examined this patient with Cecilie Kicks, NP.  I agree with the assessment and plan as outlined above. Admit orders in place. Pt still in ED waiting on a bed. Diuresing well with IV Lasix. Renal function stable. No chest pain. Adjusting BP meds. Echo pending.   Alajiah Dutkiewicz 08/26/2014 1:31 PM

## 2014-08-26 NOTE — Progress Notes (Signed)
  Echocardiogram 2D Echocardiogram has been performed.  Diamond Nickel 08/26/2014, 12:37 PM

## 2014-08-26 NOTE — ED Notes (Signed)
Attempted report 

## 2014-08-26 NOTE — ED Notes (Signed)
Patient transported to Vascular 

## 2014-08-26 NOTE — Progress Notes (Signed)
11/9 Consult received regarding diabetes control. Pt still in the ED.  Will follow pt as an inpatient once admitted.the present orders for sensitive correction tidwc and HS scale are appropriate for this time. Will follow-up throughout the hospital stay.   Thank you, Rosita Kea, RN, CNS, Diabetes Coordinator [phone])

## 2014-08-26 NOTE — Progress Notes (Signed)
ED CM noted CM consult for   Heart failure home health screen and may place order for PT/OT eval and treat if indicated.  Being Admitted  Deferred to unit CM

## 2014-08-27 DIAGNOSIS — I5031 Acute diastolic (congestive) heart failure: Secondary | ICD-10-CM

## 2014-08-27 DIAGNOSIS — N183 Chronic kidney disease, stage 3 (moderate): Secondary | ICD-10-CM

## 2014-08-27 DIAGNOSIS — I2510 Atherosclerotic heart disease of native coronary artery without angina pectoris: Secondary | ICD-10-CM

## 2014-08-27 DIAGNOSIS — E1151 Type 2 diabetes mellitus with diabetic peripheral angiopathy without gangrene: Secondary | ICD-10-CM

## 2014-08-27 DIAGNOSIS — I10 Essential (primary) hypertension: Secondary | ICD-10-CM

## 2014-08-27 LAB — GLUCOSE, CAPILLARY
GLUCOSE-CAPILLARY: 317 mg/dL — AB (ref 70–99)
GLUCOSE-CAPILLARY: 290 mg/dL — AB (ref 70–99)
Glucose-Capillary: 132 mg/dL — ABNORMAL HIGH (ref 70–99)
Glucose-Capillary: 300 mg/dL — ABNORMAL HIGH (ref 70–99)

## 2014-08-27 LAB — BASIC METABOLIC PANEL
ANION GAP: 12 (ref 5–15)
BUN: 32 mg/dL — ABNORMAL HIGH (ref 6–23)
CALCIUM: 9.2 mg/dL (ref 8.4–10.5)
CO2: 29 mEq/L (ref 19–32)
CREATININE: 1.33 mg/dL (ref 0.50–1.35)
Chloride: 102 mEq/L (ref 96–112)
GFR calc Af Amer: 60 mL/min — ABNORMAL LOW (ref >90)
GFR calc non Af Amer: 52 mL/min — ABNORMAL LOW (ref >90)
Glucose, Bld: 163 mg/dL — ABNORMAL HIGH (ref 70–99)
Potassium: 4.3 mEq/L (ref 3.7–5.3)
SODIUM: 143 meq/L (ref 137–147)

## 2014-08-27 LAB — URINE CULTURE: Colony Count: 30000

## 2014-08-27 LAB — PRO B NATRIURETIC PEPTIDE: PRO B NATRI PEPTIDE: 2854 pg/mL — AB (ref 0–125)

## 2014-08-27 MED ORDER — GLUCERNA SHAKE PO LIQD
237.0000 mL | ORAL | Status: DC
  Administered 2014-08-27: 237 mL via ORAL

## 2014-08-27 MED ORDER — HYDRALAZINE HCL 50 MG PO TABS
50.0000 mg | ORAL_TABLET | Freq: Three times a day (TID) | ORAL | Status: DC
  Administered 2014-08-27 – 2014-08-28 (×3): 50 mg via ORAL
  Filled 2014-08-27 (×6): qty 1

## 2014-08-27 NOTE — Progress Notes (Signed)
Inpatient Diabetes Program Recommendations  AACE/ADA: New Consensus Statement on Inpatient Glycemic Control (2013)  Target Ranges:  Prepandial:   less than 140 mg/dL      Peak postprandial:   less than 180 mg/dL (1-2 hours)      Critically ill patients:  140 - 180 mg/dL   Results for [name], [name] (MRN [id number]) as of 08/27/2014 09:44  Ref. Range 08/26/2014 16:52 08/26/2014 21:28 08/27/2014 06:34  Glucose-Capillary Latest Range: 70-99 mg/dL 150 (H) 172 (H) 132 (H)   Reason for assessment; consult  Diabetes history: Type 2 Outpatient Diabetes medications: Glucotrol 10mg /day, glucophage 1000mg bid Current orders for Inpatient glycemic control: Glipizide 10mg /day, Novolog correction 0-5 at hs and 0-9 units tid with meals  No recommendations at this time- CBG ideal  Gentry Fitz, RN, [place], Peter, CDE Diabetes Coordinator Inpatient Diabetes Program  [phone] (Team Pager) [phone] [facility]) 08/27/2014 9:45 AM

## 2014-08-27 NOTE — Progress Notes (Signed)
SUBJECTIVE: Breathing is better. No chest pain.   Tele: sinus, reviewed by me  BP 159/72 mmHg  Pulse 69  Temp(Src) 98.5 F (36.9 C) (Oral)  Resp 17  Ht 5\' 6"  (1.676 m)  Wt 162 lb 7.7 oz (73.7 kg)  BMI 26.24 kg/m2  SpO2 99%  Intake/Output Summary (Last 24 hours) at 08/27/14 4580 Last data filed at 08/27/14 0700  Gross per 24 hour  Intake    600 ml  Output   2200 ml  Net  -1600 ml    PHYSICAL EXAM General: Well developed, well nourished, in no acute distress. Alert and oriented x 3.  Psych:  Good affect, responds appropriately Neck: + JVD. No masses noted.  Lungs: Clear bilaterally with no wheezes or rhonci noted.  Heart: RRR with no murmurs noted. Abdomen: Bowel sounds are present. Soft, non-tender.  Extremities: No lower extremity edema.   LABS: Basic Metabolic Panel:  Recent Labs  08/26/14 0650 08/27/14 0700  NA 145 143  K 4.1 4.3  CL 105 102  CO2 26 29  GLUCOSE 54* 163*  BUN 35* 32*  CREATININE 1.38* 1.33  CALCIUM 9.6 9.2   CBC:  Recent Labs  08/26/14 0056 08/26/14 0650  WBC 10.7* 11.5*  HGB 10.1* 10.1*  HCT 31.8* 31.8*  MCV 75.5* 75.2*  PLT 438* 410*   Cardiac Enzymes:  Recent Labs  08/26/14 0332 08/26/14 0925  TROPONINI <0.30 <0.30    Current Meds: . amLODipine  10 mg Oral Daily  . aspirin EC  325 mg Oral Daily  . atorvastatin  20 mg Oral Daily  . cholecalciferol  1,000 Units Oral Daily  . enoxaparin (LOVENOX) injection  40 mg Subcutaneous Q24H  . furosemide  40 mg Intravenous Q12H  . glipiZIDE  10 mg Oral QAC breakfast  . hydrALAZINE  25 mg Oral 3 times per day  . insulin aspart  0-5 Units Subcutaneous QHS  . insulin aspart  0-9 Units Subcutaneous TID WC  . irbesartan  150 mg Oral Daily  . isosorbide mononitrate  120 mg Oral Daily  . metoprolol  50 mg Oral BID  . pantoprazole  40 mg Oral Daily  . predniSONE  20 mg Oral Q breakfast  . sodium chloride  3 mL Intravenous Q12H   Echo 08/26/14: Left ventricle: The cavity  size was normal. Systolic function was mildly reduced. The estimated ejection fraction was in the range of 45% to 50%. There is akinesis of the inferior myocardium. Features are consistent with a pseudonormal left ventricular filling pattern, with concomitant abnormal relaxation and increased filling pressure (grade 2 diastolic dysfunction). - Mitral valve: There was mild regurgitation. Impressions: - Compared to the prior study, there has been no significant interval change.  ASSESSMENT AND PLAN:  1. Acute diastolic congestive heart failure:  He has diuresed well since admission on IV Lasix. -2700 cc negative since admission. Continue IV Lasix today.   2. Coronary artery disease: Recent redo bypass grafting with angina due to distal disease in the LAD and 3 marginal branches that are not revascularized but not candidates for intervention. Neg troponin x 2. No chest pain  3. Stage III chronic kidney disease: Renal function stable with diuresis. Repeat BMET in the am  4. PAD: stable  5. HTN: BP still elevated with addition of hydralazine yesterday. Will increase hydralazine to 50 mg po TID.   6. Recent diagnosis of polymyalgia rheumatica: on steroids  7. Type 2 diabetes mellitus, with vascular disease  and nephropathy: Continue SSI   MCALHANY,CHRISTOPHER  11/10/20159:21 AM

## 2014-08-27 NOTE — Progress Notes (Signed)
INITIAL NUTRITION ASSESSMENT  DOCUMENTATION CODES Per approved criteria  -Not Applicable   INTERVENTION: Glucerna Shake po once daily, each supplement provides 220 kcal and 10 grams of protein  NUTRITION DIAGNOSIS: Inadequate oral intake related to shortness of breath as evidenced by wt loss.   Goal: Pt to meet >/= 90% of their estimated nutrition needs   Monitor:  Weight trend, po intake,   Reason for Assessment: Malnutrition screening tool  73 y.o. male  Admitting Dx: Acute diastolic congestive heart failure  ASSESSMENT: 73 year old male has a history of coronary artery disease with previous inferior infarction. 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 Pt became acutely short of breath and was transported to the emergency room. He was found to be hypoxemic and was given intravenous Lasix and oxygen and improved. He is admitted at this time for treatment of acute congestive heart failure.  - Pt with 3 lb wt loss recently. He reports a varied appetite. Some days he feels like eating normally, while his appetite is poor other days. He ate ~50% of his pot roast last night, but 100% of his breakfast. Pt agreed to try nutritional supplements for when he does not feel like eating.  - Pt with no signs of fat or muscle wasting  Height: Ht Readings from Last 1 Encounters:  08/26/14 5\' 6"  (1.676 m)    Weight: Wt Readings from Last 1 Encounters:  08/27/14 162 lb 7.7 oz (73.7 kg)    Ideal Body Weight: 63.8 kg  % Ideal Body Weight: 116%  Wt Readings from Last 10 Encounters:  08/27/14 162 lb 7.7 oz (73.7 kg)  07/23/14 165 lb (74.844 kg)  06/27/14 170 lb (77.111 kg)  04/22/14 162 lb (73.483 kg)  04/11/14 162 lb 11.2 oz (73.8 kg)  04/03/14 160 lb (72.576 kg)  03/18/14 164 lb (74.39 kg)  03/07/14 171 lb (77.565 kg)  02/20/14 171 lb (77.565 kg)  01/16/14 169 lb 1.9 oz (76.712 kg)    Usual Body Weight: 165 lbs  %  Usual Body Weight: 98%  BMI:  Body mass index is 26.24 kg/(m^2).  Estimated Nutritional Needs: Kcal: 1850-2000 Protein: 95-110 g Fluid: 2.0 L/day  Skin: intact  Diet Order: Diet Carb Modified  EDUCATION NEEDS: -Education needs addressed   Intake/Output Summary (Last 24 hours) at 08/27/14 1132 Last data filed at 08/27/14 0952  Gross per 24 hour  Intake    840 ml  Output   2200 ml  Net  -1360 ml    Last BM: prior to admission   Labs:   Recent Labs Lab 08/26/14 0056 08/26/14 0650 08/27/14 0700  NA 140 145 143  K 4.5 4.1 4.3  CL 102 105 102  CO2 21 26 29   BUN 35* 35* 32*  CREATININE 1.53* 1.38* 1.33  CALCIUM 9.4 9.6 9.2  GLUCOSE 306* 54* 163*    CBG (last 3)   Recent Labs  08/26/14 1652 08/26/14 2128 08/27/14 0634  GLUCAP 150* 172* 132*    Scheduled Meds: . amLODipine  10 mg Oral Daily  . aspirin EC  325 mg Oral Daily  . atorvastatin  20 mg Oral Daily  . cholecalciferol  1,000 Units Oral Daily  . enoxaparin (LOVENOX) injection  40 mg Subcutaneous Q24H  . furosemide  40 mg Intravenous Q12H  . glipiZIDE  10 mg Oral QAC breakfast  . hydrALAZINE  50 mg Oral 3 times per day  . insulin aspart  0-5  Units Subcutaneous QHS  . insulin aspart  0-9 Units Subcutaneous TID WC  . irbesartan  150 mg Oral Daily  . isosorbide mononitrate  120 mg Oral Daily  . metoprolol  50 mg Oral BID  . pantoprazole  40 mg Oral Daily  . predniSONE  20 mg Oral Q breakfast  . sodium chloride  3 mL Intravenous Q12H    Continuous Infusions:   Past Medical History  Diagnosis Date  . Carotid artery occlusion     Bilateral 50-69% stenoses of the internal carotid arteries. He needs carotid Dopplers every 6 months.  . Hyperlipidemia   . Atherosclerosis of native arteries of the extremities with intermittent claudication Left external iliac artery stent in 2001    Bilateral SFA occlusions documented in 2001.  Marland Kitchen Hypertension   . GERD (gastroesophageal reflux disease)   .  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 Esophageal stricture 09/24/2013    History of dilatation. Long-standing history of esophageal reflux   . 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 1999 Repeat CABG 2015 with SVG to D1, SVG to PDA, and SVG to OM.   Marland Kitchen Chronic diastolic heart failure 06/25/9891  . CAD (coronary artery disease), native coronary artery 09/24/2013    Native circumflex Taxus stent 2007, angioplasty of the SVG to native right coronary 2006, DES stent to the SVG of RCA 2007 and resplinted 2009  CABG in 1999. Stent in SVG to RCA in 1194 complicated by no reflow   . CHF (congestive heart failure)   . MI (myocardial infarction) 1999-02/2014    "I've had 6"  . Anginal pain   . COPD (chronic obstructive pulmonary disease) 09/24/2013    "ruled out 02/2014"  . Chronic bronchitis     "more times than I can count; ever since I was a kid"  . OSA (obstructive sleep apnea)     "don't wear mask" (08/26/2014)  . Type II diabetes mellitus   . Anemia   . History of hiatal hernia   . Daily headache     "lately" (08/26/2014)  . Arthritis     "qwhere"  . Chronic lower back pain   . CKD (chronic kidney disease) stage 3, GFR 30-59 ml/min   . Skin cancer     Past Surgical History  Procedure Laterality Date  . Coronary stent placement      "I've had 8 put in; I presently have none" (08/26/2014)  . Coronary artery bypass graft  1999  . Cardiac catheterization    . Coronary artery bypass graft N/A 03/01/2014    Procedure: REDO CORONARY ARTERY BYPASS GRAFTING TIMES THREE, USING RIGHT GREATER SAPHENOUS VEIN VIA ENDOVEIN HARVEST.;  Surgeon: Gaye Pollack, MD;  Location: [facility] OR;  Service: Open Heart Surgery;  Laterality: N/A;  . Intraoperative transesophageal echocardiogram N/A 03/01/2014    Procedure: INTRAOPERATIVE TRANSESOPHAGEAL  ECHOCARDIOGRAM;  Surgeon: Gaye Pollack, MD;  Location: [facility] OR;  Service: Open Heart Surgery;  Laterality: N/A;  . Anterior cervical decomp/discectomy fusion    . Ankle fracture surgery Right ~ 1977    "10 plates and 7 screws placed in it"  . Tonsillectomy    . Back surgery    . Fracture surgery    . Cataract extraction w/ intraocular lens  implant, bilateral Bilateral 2000's  .  Mohs surgery  X 2    "nose; nose"  . Skin cancer excision      "all over my head; ears; back  . Esophagogastroduodenoscopy (egd) with esophageal dilation  X 1    Laurette Schimke MS, RD, LDN

## 2014-08-27 NOTE — Progress Notes (Signed)
Inpatient Diabetes Program Recommendations  AACE/ADA: New Consensus Statement on Inpatient Glycemic Control (2013)  Target Ranges:  Prepandial:   less than 140 mg/dL      Peak postprandial:   less than 180 mg/dL (1-2 hours)      Critically ill patients:  140 - 180 mg/dL    Noted pt on Metformin at home (?). Pt with renal disease and elevated creatinine.  Would not resume at discharge.  Thank you, Rosita Kea, RN, CNS, Diabetes Coordinator [phone])

## 2014-08-28 ENCOUNTER — Other Ambulatory Visit: Payer: Self-pay | Admitting: Physician Assistant

## 2014-08-28 ENCOUNTER — Telehealth: Payer: Self-pay | Admitting: Physician Assistant

## 2014-08-28 DIAGNOSIS — I5031 Acute diastolic (congestive) heart failure: Secondary | ICD-10-CM

## 2014-08-28 DIAGNOSIS — N183 Chronic kidney disease, stage 3 unspecified: Secondary | ICD-10-CM

## 2014-08-28 LAB — BASIC METABOLIC PANEL
ANION GAP: 11 (ref 5–15)
BUN: 42 mg/dL — ABNORMAL HIGH (ref 6–23)
CHLORIDE: 102 meq/L (ref 96–112)
CO2: 29 mEq/L (ref 19–32)
Calcium: 9.1 mg/dL (ref 8.4–10.5)
Creatinine, Ser: 1.58 mg/dL — ABNORMAL HIGH (ref 0.50–1.35)
GFR calc Af Amer: 49 mL/min — ABNORMAL LOW (ref >90)
GFR calc non Af Amer: 42 mL/min — ABNORMAL LOW (ref >90)
Glucose, Bld: 148 mg/dL — ABNORMAL HIGH (ref 70–99)
POTASSIUM: 4.1 meq/L (ref 3.7–5.3)
Sodium: 142 mEq/L (ref 137–147)

## 2014-08-28 LAB — GLUCOSE, CAPILLARY: Glucose-Capillary: 149 mg/dL — ABNORMAL HIGH (ref 70–99)

## 2014-08-28 MED ORDER — HYDRALAZINE HCL 50 MG PO TABS: 50.0000 mg | ORAL_TABLET | Freq: Three times a day (TID) | ORAL | Status: DC

## 2014-08-28 MED ORDER — FUROSEMIDE 40 MG PO TABS: 40.0000 mg | ORAL_TABLET | Freq: Every day | ORAL | Status: DC

## 2014-08-28 NOTE — Progress Notes (Signed)
Patient Name: [name] Date of Encounter: 08/28/2014  Principal Problem:   Acute diastolic congestive heart failure Active Problems:   CAD (coronary artery disease), native coronary artery   Type 2 diabetes mellitus with vascular disease   CKD (chronic kidney disease) stage 3, GFR 30-59 ml/min   HTN (hypertension)   Primary Cardiologist: Dr. Daneen Schick  Patient Profile: [name] is a 73 year old male with PMH of CAD (CABG in 1999, Repeat in 2015 with SVG to D1, SVG to PDA, and SVG to OM), HLD, OSA, CHF (Grade 2 Diastolic Dysfunction; EF 27-78%), COPD, Type II DM, and Stage 3 CKD who presented to the ED on 08/26/2014 for increasing SOB.  SUBJECTIVE: Denies any shortness of breath, chest pain, or palpitations. Was able to sleep almost flat last night with only a small pillow. Taken off O2 supplementation this AM and says he was able to stand for a long period of time to wash off and has been able to walk back and forth to the restroom without experiencing any SOB.   OBJECTIVE Filed Vitals:   08/27/14 1501 08/27/14 2134 08/28/14 0527 08/28/14 0528  BP: 134/59 160/54  177/58  Pulse: 73 66 61 78  Temp: 98.3 F (36.8 C) 98.1 F (36.7 C) 98.2 F (36.8 C) 98.3 F (36.8 C)  TempSrc: Oral Oral Oral Oral  Resp: 18 18 18 18   Height:      Weight:    162 lb 0.6 oz (73.5 kg)  SpO2: 98% 99% 99% 99%    Intake/Output Summary (Last 24 hours) at 08/28/14 0849 Last data filed at 08/28/14 2423  Gross per 24 hour  Intake   1200 ml  Output   1575 ml  Net   -375 ml   Filed Weights   08/26/14 1515 08/27/14 0530 08/28/14 0528  Weight: 165 lb 9.1 oz (75.1 kg) 162 lb 7.7 oz (73.7 kg) 162 lb 0.6 oz (73.5 kg)    PHYSICAL EXAM General: Well developed, well nourished, male in no acute distress. Head: Normocephalic, atraumatic.  Neck: Supple without bruits, JVD ~ 6cm. Lungs:  Resp regular and unlabored, CTA without wheezing or rales. Heart: RRR, S1, S2, no S3, S4, or murmur;  no rub. Abdomen: Soft, non-tender, non-distended, BS + x 4.  Extremities: No clubbing, no cyanosis, no edema.  Neuro: Alert and oriented X 3. Moves all extremities spontaneously. Psych: Normal affect.  LABS: CBC: Recent Labs  08/26/14 0056 08/26/14 0650  WBC 10.7* 11.5*  HGB 10.1* 10.1*  HCT 31.8* 31.8*  MCV 75.5* 75.2*  PLT 438* 536*   Basic Metabolic Panel: Recent Labs  08/27/14 0700 08/28/14 0414  NA 143 142  K 4.3 4.1  CL 102 102  CO2 29 29  GLUCOSE 163* 148*  BUN 32* 42*  CREATININE 1.33 1.58*  CALCIUM 9.2 9.1   Liver Function Tests: Recent Labs  08/26/14 0650  AST 23  ALT 48  ALKPHOS 75  BILITOT <0.2*  PROT 6.9  ALBUMIN 2.8*   Cardiac Enzymes: Recent Labs  08/26/14 0332 08/26/14 0925  TROPONINI <0.30 <0.30    Recent Labs  08/26/14 0110  TROPIPOC 0.02   BNP: PRO B NATRIURETIC PEPTIDE (BNP)  Date/Time Value Ref Range Status  08/27/2014 07:00 AM 2854.0* 0 - 125 pg/mL Final  08/26/2014 12:56 AM 5264.0* 0 - 125 pg/mL Final   Thyroid Function Tests: Recent Labs  08/26/14 0650  TSH 3.590   TELE:  Normal sinus rhythm with rate  in the 31's - 80's without any arrhythmias.   Radiology/Studies: Echocardiogram: 08/26/2014: Study Conclusions  - Left ventricle: The cavity size was normal. Systolic function was mildly reduced. The estimated ejection fraction was in the range of 45% to 50%. There is akinesis of the inferior myocardium. Features are consistent with a pseudonormal left ventricular filling pattern, with concomitant abnormal relaxation and increased filling pressure (grade 2 diastolic dysfunction). - Mitral valve: There was mild regurgitation.  Impressions: - Compared to the prior study, there has been no significant interval change.  Current Medications:  . amLODipine  10 mg Oral Daily  . aspirin EC  325 mg Oral Daily  . atorvastatin  20 mg Oral Daily  . cholecalciferol  1,000 Units Oral Daily  . enoxaparin  (LOVENOX) injection  40 mg Subcutaneous Q24H  . feeding supplement (GLUCERNA SHAKE)  237 mL Oral Q24H  . furosemide  40 mg Intravenous Q12H  . glipiZIDE  10 mg Oral QAC breakfast  . hydrALAZINE  50 mg Oral 3 times per day  . insulin aspart  0-5 Units Subcutaneous QHS  . insulin aspart  0-9 Units Subcutaneous TID WC  . irbesartan  150 mg Oral Daily  . isosorbide mononitrate  120 mg Oral Daily  . metoprolol  50 mg Oral BID  . pantoprazole  40 mg Oral Daily  . predniSONE  20 mg Oral Q breakfast  . sodium chloride  3 mL Intravenous Q12H      ASSESSMENT AND PLAN:  1. Acute diastolic congestive heart failure - TEE on 03/01/2014 showed EF of 50-55%. TTE on 08/26/2014 showed EF of 45-50% with akinesis of the inferior myocardium and Grade 2 Diastolic Dysfunction.  - Started Prednisone 20mg  on November 6th for Polymyalgia Rheumatic. Increased SOB started over the weekend after taking the medication. Patient is scheduled to follow up with his PCP regarding the PMR on 08/29/2014. - BNP on admission was 5264. Down to 2854 on 08/27/2014. - Net I/O since admission is -3 L. Weight on admission was 165 lbs. Down to 162 lbs on 08/28/2014.  2. Coronary Artery Disease -CABG in 1999 with LIMA to LAD, sequential SVG to diagonal 2 and diagonal 3, sequential SVG to PDA, acute marginal branch, and PL branch. Repeat CABG in 2015 with SVG to D1, SVG to PDA, and SVG to OM. Native circumflex Taxus stent 2007, angioplasty of the SVG to native right coronary 2006, DES stent to the SVG of RCA 2007 and resplinted 2009. Stent in SVG to RCA in 9450 complicated by no reflow. - Troponins have been negative.  3. Type 2 diabetes mellitus - SSI with meals and at bedtime. - Continue Glipizide 10mg  daily. - Diabetes coordinator recommended to d/c Metformin at discharge due to patient's elevated creatinine.  4. CKD (chronic kidney disease) stage 3, GFR 30-59 ml/min - Creatinine elevated to 1.58 on 08/28/2014. Was 1.53 on  admission.  5. Hypertension - BP continues to be elevated with recent readings being 160/54 and 177/58. Recent BP elevation likely related to Prednisone. - Patient's Hydralazine increased to 50mg  TID yesterday, but patient only received 2 doses yesterday. - Consider keeping Hydralaizine dosing the same today and see how BP responds to 3 doses.   Signed, Patient seen and examined with PA-S Dineen Kid. Changes made where appropriate.  Rosaria Ferries , PA-C 8:49 AM 08/28/2014   I have personally seen and examined this patient with Rosaria Ferries, PA-C. I agree with the assessment and plan as outlined above. I have independently  examined this patient. He is much improved. BP is better but he states his primary care physician has not been able to control his BP for many years. Hydralazine added at 50 mg TID. Will d/c home today with Lasix 40 mg daily. Follow daily weights. Will need office f/u with Dr. Tamala Julian or office NP/PA in 1-2 weeks with BMET.   MCALHANY,CHRISTOPHER 08/28/2014 10:00 AM

## 2014-08-28 NOTE — Discharge Summary (Signed)
CARDIOLOGY DISCHARGE SUMMARY   Patient ID: [name] MRN: [id number] DOB/AGE: 05/16/1941 73 y.o.  Admit date: 08/26/2014 Discharge date: 08/28/2014  PCP: Shirline Frees, MD Primary Cardiologist: Dr. Tamala Julian  Primary Discharge Diagnosis:  Acute diastolic congestive heart failure - weight 162 pounds at discharge  Secondary Discharge Diagnosis:    CAD (coronary artery disease), native coronary artery   Type 2 diabetes mellitus with vascular disease   CKD (chronic kidney disease) stage 3, GFR 30-59 ml/min   HTN (hypertension)   Polymyalgia rheumatica  Procedures: 2 view chest x-ray, 2-D echocardiogram  Hospital Course: [name] is a 73 y.o. male with a history of CAD (CABG in 1999, Repeat in 2015 with SVG to D1, SVG to PDA, and SVG to OM), HLD, OSA, CHF (Grade 2 Diastolic Dysfunction; EF 12-87%), COPD, Type II DM, recently started on prednisone for PMR, and Stage 3 CKD who presented to the ED on 08/26/2014 for increasing SOB and weight gain.   He was diuresed with IV Lasix 40 mg every 12 hours. He lost 3 pounds during his hospital stay. His intake and output was negative by 3 L. As his volume status improved, his respiratory status improved. By discharge, he was ambulating without shortness of breath and his orthopnea as well as his PND had improved. He is to be discharged on Lasix 40 mg daily, track his weight, and follow-up with a TOC appointment in the office.  His renal function and electrolytes were followed closely during his hospital stay. His potassium was supplemented as needed. His creatinine increased with diuresis, but his chronic kidney disease is still stage III. This will be followed closely as an outpatient. Currently, he is to remain on his ARB. He is to get a BMET at his office visit next week.  He has had significant problems with hypertension for a long time. This may be worsened by the steroids needed for his PMR. He was started on hydralazine and this  was up-titrated to 50 mg twice a day. His ARB dose was not increased because of his renal insufficiency. He was already on Imdur 120 mg daily, amlodipine 10 mg daily and metoprolol 50 mg twice a day. He is to continue these medications as well, and follow up with primary care.  He had recently been started on prednisone 20 mg daily for polymyalgia rheumatica. This was continued during his hospital stay but may have contributed to his problems with hypertension, hyperglycemia and edema. He is to follow up with his primary care physician for this.  His diabetes was managed with a combination of home medications and sliding scale insulin. His metformin was held upon admission, but is being restarted at discharge as no dye studies were performed. His blood sugars were elevated at times and the prednisone may be contributing to this. He is to follow up with his primary care physician for this as well.  He was noted to be anemic, with a hemoglobin slightly lower than in September. However, his anemia dates back to May 2015 when he had redo bypass surgery. His MCV is low and he is to follow-up with his primary care physician for possible iron deficiency.  On 11/11, he was seen by Dr. Angelena Form and all data were reviewed. He was ambulating without chest pain or shortness of breath. Dr. Angelena Form felt no further inpatient workup was indicated and he is considered stable for discharge, to be followed closely with a TOC appointment next week in the  office.  Labs:   Lab Results  Component Value Date   WBC 11.5* 08/26/2014   HGB 10.1* 08/26/2014   HCT 31.8* 08/26/2014   MCV 75.2* 08/26/2014   PLT 410* 08/26/2014    Recent Labs Lab 08/26/14 0650  08/28/14 0414  NA 145  < > 142  K 4.1  < > 4.1  CL 105  < > 102  CO2 26  < > 29  BUN 35*  < > 42*  CREATININE 1.38*  < > 1.58*  CALCIUM 9.6  < > 9.1  PROT 6.9  --   --   BILITOT <0.2*  --   --   ALKPHOS 75  --   --   ALT 48  --   --   AST 23  --   --     GLUCOSE 54*  < > 148*  < > = values in this interval not displayed.  Recent Labs  08/26/14 0332 08/26/14 0925  TROPONINI <0.30 <0.30   PRO B NATRIURETIC PEPTIDE (BNP)  Date/Time Value Ref Range Status  08/27/2014 07:00 AM 2854.0* 0 - 125 pg/mL Final  08/26/2014 12:56 AM 5264.0* 0 - 125 pg/mL Final     Radiology: Dg Chest 2 View 08/26/2014   CLINICAL DATA:  Dyspnea for 4 hr, worse with lying down.  EXAM: CHEST  2 VIEW  COMPARISON:  PA and lateral chest 04/03/2014. Single view of the chest 03/02/2014. CT chest 03/20/2013.  FINDINGS: The patient is status post CABG. Heart size is normal. No pneumothorax or pleural effusion is identified. Kerley B-lines in the lower chest are noted. No focal airspace disease is seen.  IMPRESSION: Kerley B-lines in the lower chest most in keeping with mild interstitial edema.   Electronically Signed   By: Inge Rise M.D.   On: 08/26/2014 01:39   Echo: 08/26/2014 Conclusions - Left ventricle: The cavity size was normal. Systolic function was mildly reduced. The estimated ejection fraction was in the range of 45% to 50%. There is akinesis of the inferior myocardium. Features are consistent with a pseudonormal left ventricular filling pattern, with concomitant abnormal relaxation and increased filling pressure (grade 2 diastolic dysfunction). - Mitral valve: There was mild regurgitation. Impressions: - Compared to the prior study, there has been no significant interval change.  FOLLOW UP PLANS AND APPOINTMENTS No Known Allergies   Medication List    TAKE these medications        acetaminophen 325 MG tablet  Commonly known as:  TYLENOL  Take 650 mg by mouth every 6 (six) hours as needed for moderate pain.     amLODipine 10 MG tablet  Commonly known as:  NORVASC  Take 10 mg by mouth daily.     aspirin EC 325 MG tablet  Take 325 mg by mouth daily.     atorvastatin 20 MG tablet  Commonly known as:  LIPITOR  Take 20 mg by  mouth daily.     cholecalciferol 1000 UNITS tablet  Commonly known as:  VITAMIN D  Take 1,000 Units by mouth daily.     furosemide 40 MG tablet  Commonly known as:  LASIX  Take 1 tablet (40 mg total) by mouth daily.     glipiZIDE 10 MG tablet  Commonly known as:  GLUCOTROL  Take 10 mg by mouth daily before breakfast.     hydrALAZINE 50 MG tablet  Commonly known as:  APRESOLINE  Take 1 tablet (50 mg total) by mouth 3 (three) times  daily.     irbesartan 300 MG tablet  Commonly known as:  AVAPRO  Take 0.5 tablets (150 mg total) by mouth daily.     isosorbide mononitrate 120 MG 24 hr tablet  Commonly known as:  IMDUR  Take 1 tablet (120 mg total) by mouth daily.     metFORMIN 1000 MG tablet  Commonly known as:  GLUCOPHAGE  Take 1,000 mg by mouth 2 (two) times daily with a meal.     metoprolol 50 MG tablet  Commonly known as:  LOPRESSOR  Take 50 mg by mouth 2 (two) times daily.     nitroGLYCERIN 0.4 MG SL tablet  Commonly known as:  NITROSTAT  Place 1 tablet (0.4 mg total) under the tongue every 5 (five) minutes as needed for chest pain.     omeprazole 20 MG capsule  Commonly known as:  PRILOSEC  Take 20 mg by mouth daily.     predniSONE 20 MG tablet  Commonly known as:  DELTASONE  Take 20 mg by mouth daily with breakfast. Take for 14 days        Discharge Instructions    ([place]) Call MD:  Anytime you have any of the following symptoms: 1) 3 pound weight gain in 24 hours or 5 pounds in 1 week 2) shortness of breath, with or without a dry hacking cough 3) swelling in the hands, feet or stomach 4) if you have to sleep on extra pillows at night in order to breathe.    Complete by:  As directed      Diet - low sodium heart healthy    Complete by:  As directed      Diet Carb Modified    Complete by:  As directed      Increase activity slowly    Complete by:  As directed           Follow-up Information    Follow up with Melina Copa, PA-C On  09/06/2014.   Specialty:  Cardiology   Why:  at 10:00 am   Contact information:   [address] [place] [address] [place] [address] [phone]       BRING ALL MEDICATIONS WITH YOU TO FOLLOW UP APPOINTMENTS  Time spent with patient to include physician time: 36 min Signed: Rosaria Ferries, PA-C 08/28/2014, 11:22 AM Co-Sign MD

## 2014-08-28 NOTE — Telephone Encounter (Signed)
New message     TCM appt on 09-06-14 with [facility]

## 2014-08-28 NOTE — Progress Notes (Signed)
Inpatient Diabetes Program Recommendations  AACE/ADA: New Consensus Statement on Inpatient Glycemic Control (2013)  Target Ranges:  Prepandial:   less than 140 mg/dL      Peak postprandial:   less than 180 mg/dL (1-2 hours)      Critically ill patients:  140 - 180 mg/dL    Reason for assessment; elevated CBG  Diabetes history: Type 2 Outpatient Diabetes medications: Glucotrol 10mg /day, glucophage 1000mg bid Current orders for Inpatient glycemic control: Glipizide 10mg /day, Novolog correction 0-5 at hs and 0-9 units tid with meals  Please consider adding Novolog 2-3 units tid with meals (in addition to Novolog correction).  Gentry Fitz, RN, BA, MHA, CDE Diabetes Coordinator Inpatient Diabetes Program  [phone] (Team Pager) [phone] [facility]) 08/28/2014 8:03 AM

## 2014-08-28 NOTE — Progress Notes (Signed)
Foley catheter d/c'd at time of discharge. Pt self caths at home.

## 2014-08-28 NOTE — Progress Notes (Signed)
D/c instructions given to and d/w pt and wife.D/c home with wife

## 2014-08-29 ENCOUNTER — Inpatient Hospital Stay (HOSPITAL_COMMUNITY)
Admission: EM | Admit: 2014-08-29 | Discharge: 2014-09-03 | DRG: 281 | Disposition: A | Discharge status: Home / Self Care | Payer: Medicare Other | Attending: Internal Medicine | Admitting: Internal Medicine

## 2014-08-29 ENCOUNTER — Emergency Department (HOSPITAL_COMMUNITY): Payer: Medicare Other

## 2014-08-29 ENCOUNTER — Encounter (HOSPITAL_COMMUNITY): Payer: Self-pay | Admitting: Emergency Medicine

## 2014-08-29 ENCOUNTER — Telehealth: Payer: Self-pay | Admitting: Physician Assistant

## 2014-08-29 DIAGNOSIS — R739 Hyperglycemia, unspecified: Secondary | ICD-10-CM | POA: Diagnosis not present

## 2014-08-29 DIAGNOSIS — I129 Hypertensive chronic kidney disease with stage 1 through stage 4 chronic kidney disease, or unspecified chronic kidney disease: Secondary | ICD-10-CM | POA: Diagnosis present

## 2014-08-29 DIAGNOSIS — N183 Chronic kidney disease, stage 3 unspecified: Secondary | ICD-10-CM | POA: Diagnosis present

## 2014-08-29 DIAGNOSIS — I25110 Atherosclerotic heart disease of native coronary artery with unstable angina pectoris: Secondary | ICD-10-CM | POA: Diagnosis not present

## 2014-08-29 DIAGNOSIS — Z79899 Other long term (current) drug therapy: Secondary | ICD-10-CM

## 2014-08-29 DIAGNOSIS — G4733 Obstructive sleep apnea (adult) (pediatric): Secondary | ICD-10-CM | POA: Diagnosis present

## 2014-08-29 DIAGNOSIS — IMO0002 Reserved for concepts with insufficient information to code with codable children: Secondary | ICD-10-CM

## 2014-08-29 DIAGNOSIS — I5032 Chronic diastolic (congestive) heart failure: Secondary | ICD-10-CM

## 2014-08-29 DIAGNOSIS — E785 Hyperlipidemia, unspecified: Secondary | ICD-10-CM | POA: Diagnosis present

## 2014-08-29 DIAGNOSIS — I6523 Occlusion and stenosis of bilateral carotid arteries: Secondary | ICD-10-CM | POA: Diagnosis present

## 2014-08-29 DIAGNOSIS — K219 Gastro-esophageal reflux disease without esophagitis: Secondary | ICD-10-CM | POA: Diagnosis present

## 2014-08-29 DIAGNOSIS — I25810 Atherosclerosis of coronary artery bypass graft(s) without angina pectoris: Secondary | ICD-10-CM | POA: Diagnosis present

## 2014-08-29 DIAGNOSIS — I5042 Chronic combined systolic (congestive) and diastolic (congestive) heart failure: Secondary | ICD-10-CM | POA: Diagnosis present

## 2014-08-29 DIAGNOSIS — E1159 Type 2 diabetes mellitus with other circulatory complications: Secondary | ICD-10-CM | POA: Diagnosis present

## 2014-08-29 DIAGNOSIS — N390 Urinary tract infection, site not specified: Secondary | ICD-10-CM | POA: Diagnosis present

## 2014-08-29 DIAGNOSIS — J449 Chronic obstructive pulmonary disease, unspecified: Secondary | ICD-10-CM | POA: Diagnosis not present

## 2014-08-29 DIAGNOSIS — E1165 Type 2 diabetes mellitus with hyperglycemia: Secondary | ICD-10-CM | POA: Diagnosis not present

## 2014-08-29 DIAGNOSIS — D638 Anemia in other chronic diseases classified elsewhere: Secondary | ICD-10-CM | POA: Diagnosis not present

## 2014-08-29 DIAGNOSIS — I471 Supraventricular tachycardia: Secondary | ICD-10-CM | POA: Diagnosis not present

## 2014-08-29 DIAGNOSIS — R0789 Other chest pain: Secondary | ICD-10-CM | POA: Diagnosis not present

## 2014-08-29 DIAGNOSIS — I2582 Chronic total occlusion of coronary artery: Secondary | ICD-10-CM | POA: Diagnosis present

## 2014-08-29 DIAGNOSIS — R072 Precordial pain: Secondary | ICD-10-CM | POA: Diagnosis not present

## 2014-08-29 DIAGNOSIS — Z85828 Personal history of other malignant neoplasm of skin: Secondary | ICD-10-CM

## 2014-08-29 DIAGNOSIS — Z87891 Personal history of nicotine dependence: Secondary | ICD-10-CM

## 2014-08-29 DIAGNOSIS — E1151 Type 2 diabetes mellitus with diabetic peripheral angiopathy without gangrene: Secondary | ICD-10-CM | POA: Diagnosis not present

## 2014-08-29 DIAGNOSIS — D649 Anemia, unspecified: Secondary | ICD-10-CM | POA: Diagnosis present

## 2014-08-29 DIAGNOSIS — M353 Polymyalgia rheumatica: Secondary | ICD-10-CM

## 2014-08-29 DIAGNOSIS — I2510 Atherosclerotic heart disease of native coronary artery without angina pectoris: Secondary | ICD-10-CM | POA: Diagnosis present

## 2014-08-29 DIAGNOSIS — Z7982 Long term (current) use of aspirin: Secondary | ICD-10-CM

## 2014-08-29 DIAGNOSIS — I2584 Coronary atherosclerosis due to calcified coronary lesion: Secondary | ICD-10-CM | POA: Diagnosis present

## 2014-08-29 DIAGNOSIS — I252 Old myocardial infarction: Secondary | ICD-10-CM

## 2014-08-29 DIAGNOSIS — R079 Chest pain, unspecified: Secondary | ICD-10-CM

## 2014-08-29 DIAGNOSIS — I25709 Atherosclerosis of coronary artery bypass graft(s), unspecified, with unspecified angina pectoris: Secondary | ICD-10-CM | POA: Diagnosis present

## 2014-08-29 DIAGNOSIS — D508 Other iron deficiency anemias: Secondary | ICD-10-CM | POA: Diagnosis not present

## 2014-08-29 DIAGNOSIS — Z981 Arthrodesis status: Secondary | ICD-10-CM

## 2014-08-29 DIAGNOSIS — I25119 Atherosclerotic heart disease of native coronary artery with unspecified angina pectoris: Secondary | ICD-10-CM | POA: Diagnosis not present

## 2014-08-29 DIAGNOSIS — I5022 Chronic systolic (congestive) heart failure: Secondary | ICD-10-CM | POA: Diagnosis not present

## 2014-08-29 DIAGNOSIS — I214 Non-ST elevation (NSTEMI) myocardial infarction: Principal | ICD-10-CM

## 2014-08-29 HISTORY — DX: Supraventricular tachycardia, unspecified: I47.10

## 2014-08-29 HISTORY — DX: Supraventricular tachycardia: I47.1

## 2014-08-29 LAB — I-STAT TROPONIN, ED: Troponin i, poc: 0.02 ng/mL (ref 0.00–0.08)

## 2014-08-29 LAB — URINALYSIS, ROUTINE W REFLEX MICROSCOPIC
Bilirubin Urine: NEGATIVE
Glucose, UA: >1000 mg/dL — AB
Hgb urine dipstick: NEGATIVE
Ketones, ur: NEGATIVE mg/dL
Leukocytes, UA: NEGATIVE
Nitrite: POSITIVE — AB
Protein, ur: >300 mg/dL — AB
Specific Gravity, Urine: 1.024 (ref 1.005–1.030)
Urobilinogen, UA: 0.2 mg/dL (ref 0.0–1.0)
pH: 5.5 (ref 5.0–8.0)

## 2014-08-29 LAB — URINE MICROSCOPIC-ADD ON

## 2014-08-29 LAB — TROPONIN I: Troponin I: 0.47 ng/mL (ref <0.30)

## 2014-08-29 LAB — BASIC METABOLIC PANEL
Anion gap: 20 — ABNORMAL HIGH (ref 5–15)
BUN: 45 mg/dL — ABNORMAL HIGH (ref 6–23)
CO2: 22 mEq/L (ref 19–32)
Calcium: 9.2 mg/dL (ref 8.4–10.5)
Chloride: 93 mEq/L — ABNORMAL LOW (ref 96–112)
Creatinine, Ser: 1.74 mg/dL — ABNORMAL HIGH (ref 0.50–1.35)
GFR calc Af Amer: 43 mL/min — ABNORMAL LOW (ref >90)
GFR calc non Af Amer: 37 mL/min — ABNORMAL LOW (ref >90)
Glucose, Bld: 493 mg/dL — ABNORMAL HIGH (ref 70–99)
Potassium: 4.9 mEq/L (ref 3.7–5.3)
Sodium: 135 mEq/L — ABNORMAL LOW (ref 137–147)

## 2014-08-29 LAB — CBC
HCT: 31.2 % — ABNORMAL LOW (ref 39.0–52.0)
Hemoglobin: 10.1 g/dL — ABNORMAL LOW (ref 13.0–17.0)
MCH: 23.9 pg — ABNORMAL LOW (ref 26.0–34.0)
MCHC: 32.4 g/dL (ref 30.0–36.0)
MCV: 73.8 fL — ABNORMAL LOW (ref 78.0–100.0)
Platelets: 385 10*3/uL (ref 150–400)
RBC: 4.23 MIL/uL (ref 4.22–5.81)
RDW: 16.3 % — ABNORMAL HIGH (ref 11.5–15.5)
WBC: 10.3 10*3/uL (ref 4.0–10.5)

## 2014-08-29 MED ORDER — DEXTROSE 5 % IV SOLN
1.0000 g | INTRAVENOUS | Status: DC
  Administered 2014-08-29 – 2014-08-31 (×3): 1 g via INTRAVENOUS
  Filled 2014-08-29 (×4): qty 10

## 2014-08-29 MED ORDER — SODIUM CHLORIDE 0.9 % IV SOLN
INTRAVENOUS | Status: DC
  Administered 2014-08-30: 0.4 [IU]/h via INTRAVENOUS
  Administered 2014-08-30: via INTRAVENOUS
  Filled 2014-08-29: qty 2.5

## 2014-08-29 MED ORDER — FENTANYL CITRATE 0.05 MG/ML IJ SOLN
50.0000 ug | Freq: Once | INTRAMUSCULAR | Status: AC
  Administered 2014-08-29: 50 ug via INTRAVENOUS
  Filled 2014-08-29: qty 2

## 2014-08-29 MED ORDER — INSULIN REGULAR BOLUS VIA INFUSION
0.0000 [IU] | Freq: Three times a day (TID) | INTRAVENOUS | Status: DC
  Filled 2014-08-29: qty 10

## 2014-08-29 NOTE — H&P (Signed)
[facility] History and Physical  ORELL HURTADO OEU:[id number] DOB: 07/21/1941 DOA: 08/29/2014  Referring physician: Virgel Manifold, MD PCP: Shirline Frees, MD   Chief Complaint: Chest Pain Uncontrolled Diabetes  HPI: [name] is a 73 y.o. male presents with chest pain. He had been discharged yesterday after an admission for congestive heart failure. He was treated with diuretics and responded well so was sent home. He states that today he started having chest pain. Patient states that the pain was located in the center chest and was sharp type of pain. He states that the pain was similar to prior episodes when he has had an MI. 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 During his last stay he was noted to have an EF of 45-50%. Patient states that he was also recently started on prednisone for PMR and has been on 20mg  daily. His sugars were noted to be 498 here in the ED. Patient is being admitted for better control of his sugars. In addition he has a UTI.   Review of Systems:  Constitutional:  No weight loss, night sweats, Fevers, chills, fatigue.  HEENT:  No headaches, Difficulty swallowing Cardio-vascular:  ++chest pain, no swelling in lower extremities  GI:  No heartburn, indigestion, abdominal pain, nausea, vomiting, ++diarrhea  Resp:  No shortness of breath with exertion or at rest. No excess mucus, no productive cough  Skin:  no rash or lesions.  GU:  ++dysuria.  Musculoskeletal:  No joint pain or swelling. No back pain.  Psych:  No change in mood or affect.   Past Medical History  Diagnosis Date  . Carotid artery occlusion     Bilateral 50-69% stenoses of the internal carotid arteries. He needs carotid Dopplers every 6 months.  . Hyperlipidemia   . Atherosclerosis of native arteries of the extremities with intermittent claudication Left external iliac artery stent in 2001    Bilateral SFA occlusions documented in  2001.  Marland Kitchen Hypertension   . GERD (gastroesophageal reflux disease)   . 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 Esophageal stricture 09/24/2013    History of dilatation. Long-standing history of esophageal reflux   . 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 1999 Repeat CABG 2015 with SVG to D1, SVG to PDA, and SVG to OM.   Marland Kitchen Chronic diastolic heart failure 10/22/4006  . CAD (coronary artery disease), native coronary artery 09/24/2013    Native circumflex Taxus stent 2007, angioplasty of the SVG to native right coronary 2006, DES stent to the SVG of RCA 2007 and resplinted 2009  CABG in 1999. Stent in SVG to RCA in 6761 complicated by no reflow   . CHF (congestive heart failure)   . MI (myocardial infarction) 1999-02/2014    "I've had 6"  . Anginal pain   . COPD (chronic obstructive pulmonary disease) 09/24/2013    "ruled out 02/2014"  . Chronic bronchitis     "more times than I can count; ever since I was a kid"  . OSA (obstructive sleep apnea)     "don't wear mask" (08/26/2014)  . Type II diabetes mellitus   . Anemia   . History of hiatal hernia   . Daily headache     "lately" (08/26/2014)  .  Arthritis     "qwhere"  . Chronic lower back pain   . CKD (chronic kidney disease) stage 3, GFR 30-59 ml/min   . Skin cancer    Past Surgical History  Procedure Laterality Date  . Coronary stent placement      "I've had 8 put in; I presently have none" (08/26/2014)  . Coronary artery bypass graft  1999  . Cardiac catheterization    . Coronary artery bypass graft N/A 03/01/2014    Procedure: REDO CORONARY ARTERY BYPASS GRAFTING TIMES THREE, USING RIGHT GREATER SAPHENOUS VEIN VIA ENDOVEIN HARVEST.;  Surgeon: Gaye Pollack, MD;  Location: [facility];  Service: Open Heart Surgery;  Laterality: N/A;  . Intraoperative transesophageal  echocardiogram N/A 03/01/2014    Procedure: INTRAOPERATIVE TRANSESOPHAGEAL ECHOCARDIOGRAM;  Surgeon: Gaye Pollack, MD;  Location: [facility] OR;  Service: Open Heart Surgery;  Laterality: N/A;  . Anterior cervical decomp/discectomy fusion    . Ankle fracture surgery Right ~ 1977    "10 plates and 7 screws placed in it"  . Tonsillectomy    . Back surgery    . Fracture surgery    . Cataract extraction w/ intraocular lens  implant, bilateral Bilateral 2000's  . Mohs surgery  X 2    "nose; nose"  . Skin cancer excision      "all over my head; ears; back  . Esophagogastroduodenoscopy (egd) with esophageal dilation  X 1   Social History:  reports that he quit smoking about 6 months ago. His smoking use included Cigarettes. He has a 50 pack-year smoking history. He has never used smokeless tobacco. He reports that he does not drink alcohol or use illicit drugs.  No Known Allergies  Family History  Problem Relation Age of Onset  . CAD Mother   . Cirrhosis Brother      Prior to Admission medications   Medication Sig Start Date End Date Taking? Authorizing Provider  acetaminophen (TYLENOL) 325 MG tablet Take 650 mg by mouth every 6 (six) hours as needed for moderate pain.   Yes Historical Provider, MD  amLODipine (NORVASC) 10 MG tablet Take 10 mg by mouth daily.   Yes Historical Provider, MD  aspirin EC 325 MG tablet Take 325 mg by mouth daily.   Yes Historical Provider, MD  atorvastatin (LIPITOR) 20 MG tablet Take 20 mg by mouth daily at 6 PM.    Yes Historical Provider, MD  cholecalciferol (VITAMIN D) 1000 UNITS tablet Take 1,000 Units by mouth daily.   Yes Historical Provider, MD  furosemide (LASIX) 40 MG tablet Take 1 tablet (40 mg total) by mouth daily. 08/28/14  Yes Rhonda G Barrett, PA-C  glipiZIDE (GLUCOTROL) 10 MG tablet Take 10 mg by mouth every evening.    Yes Historical Provider, MD  hydrALAZINE (APRESOLINE) 50 MG tablet Take 1 tablet (50 mg total) by mouth 3 (three) times daily.  08/28/14  Yes Rhonda G Barrett, PA-C  irbesartan (AVAPRO) 300 MG tablet Take 0.5 tablets (150 mg total) by mouth daily. Patient taking differently: Take 150 mg by mouth every morning.  04/22/14  Yes Belva Crome III, MD  isosorbide mononitrate (IMDUR) 120 MG 24 hr tablet Take 1 tablet (120 mg total) by mouth daily. 07/24/14  Yes Belva Crome III, MD  metFORMIN (GLUCOPHAGE) 1000 MG tablet Take 1,000 mg by mouth 2 (two) times daily with a meal.   Yes Historical Provider, MD  metoprolol (LOPRESSOR) 50 MG tablet Take 50 mg by mouth 2 (two) times  daily.   Yes Historical Provider, MD  nitroGLYCERIN (NITROSTAT) 0.4 MG SL tablet Place 1 tablet (0.4 mg total) under the tongue every 5 (five) minutes as needed for chest pain. 06/27/14  Yes Belva Crome III, MD  omeprazole (PRILOSEC) 20 MG capsule Take 20 mg by mouth daily.   Yes Historical Provider, MD  predniSONE (DELTASONE) 20 MG tablet Take 20 mg by mouth daily with breakfast. Take for 14 days 08/23/14 09/06/14 Yes Historical Provider, MD   Physical Exam: Filed Vitals:   08/29/14 2145 08/29/14 2200 08/29/14 2215 08/29/14 2230  BP: 178/64 174/58 170/56 163/48  Pulse: 71 72 65 65  Temp:      TempSrc:      Resp: 16 21 13 22   Height:      Weight:      SpO2: 96% 95% 97% 95%    Wt Readings from Last 3 Encounters:  08/29/14 73.936 kg (163 lb)  08/28/14 73.5 kg (162 lb 0.6 oz)  07/23/14 74.844 kg (165 lb)    General:  Appears calm and comfortable Eyes: PERRL, normal lids, irises & conjunctiva ENT: grossly normal hearing, lips & tongue Neck: no LAD, masses or thyromegaly Cardiovascular: RRR, no m/r/g. No LE edema. Telemetry: SR, no arrhythmias  Respiratory: Normal respiratory effort. Scattered crackles noted Abdomen: soft, ntnd Skin: no rash or induration seen on limited exam Musculoskeletal: grossly normal tone BUE/BLE Psychiatric: grossly normal mood and affect, speech fluent and appropriate Neurologic: grossly non-focal.          Labs on  Admission:  Basic Metabolic Panel:  Recent Labs Lab 08/26/14 0056 08/26/14 0650 08/27/14 0700 08/28/14 0414 08/29/14 1844  NA 140 145 143 142 135*  K 4.5 4.1 4.3 4.1 4.9  CL 102 105 102 102 93*  CO2 21 26 29 29 22   GLUCOSE 306* 54* 163* 148* 493*  BUN 35* 35* 32* 42* 45*  CREATININE 1.53* 1.38* 1.33 1.58* 1.74*  CALCIUM 9.4 9.6 9.2 9.1 9.2   Liver Function Tests:  Recent Labs Lab 08/26/14 0650  AST 23  ALT 48  ALKPHOS 75  BILITOT <0.2*  PROT 6.9  ALBUMIN 2.8*   No results for input(s): LIPASE, AMYLASE in the last 168 hours. No results for input(s): AMMONIA in the last 168 hours. CBC:  Recent Labs Lab 08/26/14 0056 08/26/14 0650 08/29/14 1844  WBC 10.7* 11.5* 10.3  HGB 10.1* 10.1* 10.1*  HCT 31.8* 31.8* 31.2*  MCV 75.5* 75.2* 73.8*  PLT 438* 410* 385   Cardiac Enzymes:  Recent Labs Lab 08/26/14 0332 08/26/14 0925 08/29/14 2129  TROPONINI <0.30 <0.30 0.47*    BNP (last 3 results)  Recent Labs  02/25/14 2302 08/26/14 0056 08/27/14 0700  PROBNP 182.4* 5264.0* 2854.0*   CBG:  Recent Labs Lab 08/27/14 0634 08/27/14 1105 08/27/14 1655 08/27/14 2136 08/28/14 0555  GLUCAP 132* 317* 300* 290* 149*    Radiological Exams on Admission: Dg Chest Port 1 View  08/29/2014   CLINICAL DATA:  Left-sided chest pain today.  EXAM: PORTABLE CHEST - 1 VIEW  COMPARISON:  08/26/2014  FINDINGS: Lungs are adequately inflated without consolidation or effusion. Borderline cardiomegaly unchanged. Remainder of the exam is unchanged.  IMPRESSION: No acute cardiopulmonary disease.  Borderline cardiomegaly.   Electronically Signed   By: Marin Olp M.D.   On: 08/29/2014 19:18        Assessment/Plan Principal Problem:   Diabetes type 2, uncontrolled Active Problems:   Hyperlipidemia   CAD (coronary artery disease), native coronary  artery   COPD (chronic obstructive pulmonary disease)   Type 2 diabetes mellitus with vascular disease   Congestive heart  failure   1. Diabetes Mellitus type 2 uncontrolled -patient has been on steroids which is likely driving his sugar up -will start on insulin drip -will monitor glucoses -check FS -start on Carb modified diet -IVF will be given slowly due to cardiac history -will hold oral agents and he may need to be on insulin on discharge  2. CAD -initial Troponin was negative but second showed an increase -cardiology to see patient  3. COPD -nebulizer prn for wheeze and SOB -oxygen as needed  4. CHF -current CXR is clear however he does have some crackles on exam -will continue with lasix as per cardiology orders  5. Hyperlipidemia -continue home medications  6. UTI -has longstanding history of self cath -will start on rocephin now -await cultures results  Code Status: Full Code (must indicate code status--if unknown or must be presumed, indicate so) DVT Prophylaxis:heparin Family Communication: Friend in room (indicate person spoken with, if applicable, with phone number if by telephone) Disposition Plan: Home (indicate anticipated LOS)  Time spent: 64min  KHAN,SAADAT A [facility] Pager [phone]

## 2014-08-29 NOTE — Telephone Encounter (Signed)
Patient contacted regarding discharge from  [facility] on 08/28/2014  Patient understands to follow up with provider Yes Melina Copa on 09/06/2014 at 10:00am At the [facility] office.  Patient understands discharge instructions. Yes  Patient understands medications and regiment? YesPatient understands to bring all medications to this visit?  Yes  Patient understands to weigh daily and call if weight gain, sob, cough or swelling anywhere in his body.

## 2014-08-29 NOTE — ED Notes (Signed)
Per EMS: pt from home for eval of cp that started today while at home, pt states no radiation but reports sob. Hx of stents and CABG in the past, pt took 7 nitro with no relief at home and 325mg  of aspirin. nad noted. axox 4. Skin warm and dry.

## 2014-08-29 NOTE — ED Notes (Signed)
Paged [name], patient of Dr. Thompson Caul; [name] on-call for [facility]

## 2014-08-29 NOTE — ED Provider Notes (Signed)
CSN: [id number]     Arrival date & time 08/29/14  1827 History   First MD Initiated Contact with Patient 08/29/14 2000     Chief Complaint  Patient presents with  . Chest Pain     (Consider location/radiation/quality/duration/timing/severity/associated sxs/prior Treatment) HPI   73 year old male with chest pain. Symptom onset at approximately 6:30 PM tonight. Patient was sitting on his couch returns from onset. Pain is substernal. Feels like "the worst indigestion you've ever felt."occasionally sharper pain. Symptoms been fairly constant since onset. Patient has a significant cardiac history. States that current symptoms feel similar to previous heart attacks. He tried taking a dose of Imdur, metoprolol and numerous doses of nitroglycerin without any significant change so came to the emergency room. Took aspirin earlier today. Currently still having symptoms. He was just discharged from the hospital yesterday for HF exacerbation. Says breathing has been better since he was discharged including since the pain started this evening.  Past Medical History  Diagnosis Date  . Carotid artery occlusion     Bilateral 50-69% stenoses of the internal carotid arteries. He needs carotid Dopplers every 6 months.  . Hyperlipidemia   . Atherosclerosis of native arteries of the extremities with intermittent claudication Left external iliac artery stent in 2001    Bilateral SFA occlusions documented in 2001.  Marland Kitchen Hypertension   . GERD (gastroesophageal reflux disease)   . 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 Esophageal stricture 09/24/2013    History of dilatation. Long-standing history of esophageal reflux   . 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 1999 Repeat CABG 2015 with SVG to D1, SVG to PDA, and SVG to OM.   Marland Kitchen Chronic diastolic  heart failure 06/20/8100  . CAD (coronary artery disease), native coronary artery 09/24/2013    Native circumflex Taxus stent 2007, angioplasty of the SVG to native right coronary 2006, DES stent to the SVG of RCA 2007 and resplinted 2009  CABG in 1999. Stent in SVG to RCA in 7510 complicated by no reflow   . CHF (congestive heart failure)   . MI (myocardial infarction) 1999-02/2014    "I've had 6"  . Anginal pain   . COPD (chronic obstructive pulmonary disease) 09/24/2013    "ruled out 02/2014"  . Chronic bronchitis     "more times than I can count; ever since I was a kid"  . OSA (obstructive sleep apnea)     "don't wear mask" (08/26/2014)  . Type II diabetes mellitus   . Anemia   . History of hiatal hernia   . Daily headache     "lately" (08/26/2014)  . Arthritis     "qwhere"  . Chronic lower back pain   . CKD (chronic kidney disease) stage 3, GFR 30-59 ml/min   . Skin cancer    Past Surgical History  Procedure Laterality Date  . Coronary stent placement      "I've had 8 put in; I presently have none" (08/26/2014)  . Coronary artery bypass graft  1999  . Cardiac catheterization    . Coronary artery bypass graft N/A 03/01/2014    Procedure: REDO CORONARY ARTERY BYPASS GRAFTING TIMES THREE, USING RIGHT GREATER SAPHENOUS VEIN VIA ENDOVEIN HARVEST.;  Surgeon: Gaye Pollack, MD;  Location: [facility];  Service: Open Heart Surgery;  Laterality: N/A;  . Intraoperative transesophageal echocardiogram N/A 03/01/2014    Procedure: INTRAOPERATIVE TRANSESOPHAGEAL ECHOCARDIOGRAM;  Surgeon: Gaye Pollack, MD;  Location: [facility];  Service: Open Heart Surgery;  Laterality: N/A;  . Anterior cervical decomp/discectomy fusion    . Ankle fracture surgery Right ~ 1977    "10 plates and 7 screws placed in it"  . Tonsillectomy    . Back surgery    . Fracture surgery    . Cataract extraction w/ intraocular lens  implant, bilateral Bilateral 2000's  . Mohs surgery  X 2    "nose; nose"  . Skin cancer excision       "all over my head; ears; back  . Esophagogastroduodenoscopy (egd) with esophageal dilation  X 1   Family History  Problem Relation Age of Onset  . CAD Mother   . Cirrhosis Brother    History  Substance Use Topics  . Smoking status: Former Smoker -- 1.00 packs/day for 50 years    Types: Cigarettes    Quit date: 02/25/2014  . Smokeless tobacco: Never Used  . Alcohol Use: No    Review of Systems  All systems reviewed and negative, other than as noted in HPI.   Allergies  Review of patient's allergies indicates no known allergies.  Home Medications   Prior to Admission medications   Medication Sig Start Date End Date Taking? Authorizing Provider  acetaminophen (TYLENOL) 325 MG tablet Take 650 mg by mouth every 6 (six) hours as needed for moderate pain.   Yes Historical Provider, MD  amLODipine (NORVASC) 10 MG tablet Take 10 mg by mouth daily.   Yes Historical Provider, MD  aspirin EC 325 MG tablet Take 325 mg by mouth daily.   Yes Historical Provider, MD  atorvastatin (LIPITOR) 20 MG tablet Take 20 mg by mouth daily at 6 PM.    Yes Historical Provider, MD  cholecalciferol (VITAMIN D) 1000 UNITS tablet Take 1,000 Units by mouth daily.   Yes Historical Provider, MD  furosemide (LASIX) 40 MG tablet Take 1 tablet (40 mg total) by mouth daily. 08/28/14  Yes Rhonda G Barrett, PA-C  glipiZIDE (GLUCOTROL) 10 MG tablet Take 10 mg by mouth every evening.    Yes Historical Provider, MD  hydrALAZINE (APRESOLINE) 50 MG tablet Take 1 tablet (50 mg total) by mouth 3 (three) times daily. 08/28/14  Yes Rhonda G Barrett, PA-C  irbesartan (AVAPRO) 300 MG tablet Take 0.5 tablets (150 mg total) by mouth daily. Patient taking differently: Take 150 mg by mouth every morning.  04/22/14  Yes Belva Crome III, MD  isosorbide mononitrate (IMDUR) 120 MG 24 hr tablet Take 1 tablet (120 mg total) by mouth daily. 07/24/14  Yes Belva Crome III, MD  metFORMIN (GLUCOPHAGE) 1000 MG tablet Take 1,000 mg by mouth 2  (two) times daily with a meal.   Yes Historical Provider, MD  metoprolol (LOPRESSOR) 50 MG tablet Take 50 mg by mouth 2 (two) times daily.   Yes Historical Provider, MD  nitroGLYCERIN (NITROSTAT) 0.4 MG SL tablet Place 1 tablet (0.4 mg total) under the tongue every 5 (five) minutes as needed for chest pain. 06/27/14  Yes Belva Crome III, MD  omeprazole (PRILOSEC) 20 MG capsule Take 20 mg by mouth daily.   Yes Historical Provider, MD  predniSONE (DELTASONE) 20 MG tablet Take 20 mg by mouth daily with breakfast. Take for 14 days 08/23/14 09/06/14 Yes Historical Provider, MD   BP 196/70 mmHg  Pulse 91  Temp(Src) 97.8 F (36.6 C) (Oral)  Resp 18  Ht 5\' 6"  (1.676 m)  Wt 163 lb (73.936 kg)  BMI 26.32 kg/m2  SpO2 96% Physical Exam  Constitutional: He appears well-developed and well-nourished. No distress.  HENT:  Head: Normocephalic and atraumatic.  Eyes: Conjunctivae and EOM are normal. Pupils are equal, round, and reactive to light. Right eye exhibits no discharge. Left eye exhibits no discharge.  Neck: Neck supple.  Cardiovascular: Normal rate, regular rhythm and normal heart sounds.  Exam reveals no gallop and no friction rub.   No murmur heard. Pulmonary/Chest: Effort normal and breath sounds normal. No respiratory distress.  Abdominal: Soft. He exhibits no distension. There is no tenderness.  Musculoskeletal: He exhibits no edema or tenderness.  Neurological: He is alert.  Skin: Skin is warm and dry.  Psychiatric: He has a normal mood and affect. His behavior is normal. Thought content normal.  Nursing note and vitals reviewed.   ED Course  Procedures (including critical care time) Labs Review Labs Reviewed  BASIC METABOLIC PANEL - Abnormal; Notable for the following:    Sodium 135 (*)    Chloride 93 (*)    Glucose, Bld 493 (*)    BUN 45 (*)    Creatinine, Ser 1.74 (*)    GFR calc non Af Amer 37 (*)    GFR calc Af Amer 43 (*)    Anion gap 20 (*)    All other components  within normal limits  CBC - Abnormal; Notable for the following:    Hemoglobin 10.1 (*)    HCT 31.2 (*)    MCV 73.8 (*)    MCH 23.9 (*)    RDW 16.3 (*)    All other components within normal limits  URINALYSIS, ROUTINE W REFLEX MICROSCOPIC  I-STAT TROPOININ, ED    Imaging Review Dg Chest Port 1 View  08/29/2014   CLINICAL DATA:  Left-sided chest pain today.  EXAM: PORTABLE CHEST - 1 VIEW  COMPARISON:  08/26/2014  FINDINGS: Lungs are adequately inflated without consolidation or effusion. Borderline cardiomegaly unchanged. Remainder of the exam is unchanged.  IMPRESSION: No acute cardiopulmonary disease.  Borderline cardiomegaly.   Electronically Signed   By: Marin Olp M.D.   On: 08/29/2014 19:18     EKG Interpretation   Date/Time:  Thursday August 29 2014 18:36:17 EST Ventricular Rate:  102 PR Interval:  158 QRS Duration: 143 QT Interval:  378 QTC Calculation: 492 R Axis:   -82 Text Interpretation:  Sinus tachycardia Ventricular premature complex  Right bundle branch block Abnrm T, consider ischemia, anterolateral lds  Confirmed by Wilson Singer  MD, Zawadi Aplin ([id number]) on 08/29/2014 6:45:05 PM      MDM   Final diagnoses:  Chest pain, unspecified chest pain type  Hyperglycemia  NSTEMI      Virgel Manifold, MD 09/05/14 1040

## 2014-08-29 NOTE — Consult Note (Signed)
Reason for Consult: chest pain Primary Cardiologist: Dr. Pernell Dupre Referring Physician: Dr. Candelaria Stagers is an 73 y.o. male.  HPI: [name] is a 73 yo man with PMH of CAD (CABG in 1999, repeat CABG in 2015 with SVG to D1, SVG to PDA, and SVG to OM), HLD, OSA, CHF with last known EF 45-50%, grade II DD, T2DM, COPD, stage III CKD who recently was admitted for dyspnea and found to have heart failure that responded to diuresis with discharge yesterday who represents today with development of chest pain at 18:30 while sitting on the couch at home. He localized the pain to substernal area, different and worse than any indigestion he's had with a sharp character similar to previous MI. He attempted to improve pain with imdur, metoprolol and SL NTG without improvement leading to ER presentation. Cardiology consulted given nature of pain. His pain has actually resolved but it has taken some time after 3-4 SL NTG, imdur and metoprolol at home. He recently started prednisone for polymyalgia.    Past Medical History  Diagnosis Date  . Carotid artery occlusion     Bilateral 50-69% stenoses of the internal carotid arteries. He needs carotid Dopplers every 6 months.  . Hyperlipidemia   . Atherosclerosis of native arteries of the extremities with intermittent claudication Left external iliac artery stent in 2001    Bilateral SFA occlusions documented in 2001.  Marland Kitchen Hypertension   . GERD (gastroesophageal reflux disease)   . 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 Esophageal stricture 09/24/2013    History of dilatation. Long-standing history of esophageal reflux   . 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 1999 Repeat CABG 2015 with SVG to D1, SVG to PDA, and SVG to OM.   Marland Kitchen Chronic diastolic heart failure 0/04/3709  . CAD  (coronary artery disease), native coronary artery 09/24/2013    Native circumflex Taxus stent 2007, angioplasty of the SVG to native right coronary 2006, DES stent to the SVG of RCA 2007 and resplinted 2009  CABG in 1999. Stent in SVG to RCA in 6269 complicated by no reflow   . CHF (congestive heart failure)   . MI (myocardial infarction) 1999-02/2014    "I've had 6"  . Anginal pain   . COPD (chronic obstructive pulmonary disease) 09/24/2013    "ruled out 02/2014"  . Chronic bronchitis     "more times than I can count; ever since I was a kid"  . OSA (obstructive sleep apnea)     "don't wear mask" (08/26/2014)  . Type II diabetes mellitus   . Anemia   . History of hiatal hernia   . Daily headache     "lately" (08/26/2014)  . Arthritis     "qwhere"  . Chronic lower back pain   . CKD (chronic kidney disease) stage 3, GFR 30-59 ml/min   . Skin cancer     Past Surgical History  Procedure Laterality Date  . Coronary stent placement      "I've had 8 put in; I presently have none" (08/26/2014)  . Coronary artery bypass graft  1999  . Cardiac catheterization    . Coronary artery bypass graft N/A 03/01/2014    Procedure: REDO CORONARY ARTERY BYPASS GRAFTING TIMES THREE, USING RIGHT GREATER SAPHENOUS VEIN  VIA ENDOVEIN HARVEST.;  Surgeon: [name], MD;  Location: [facility] OR;  Service: Open Heart Surgery;  Laterality: N/A;  . Intraoperative transesophageal echocardiogram N/A 03/01/2014    Procedure: INTRAOPERATIVE TRANSESOPHAGEAL ECHOCARDIOGRAM;  Surgeon: [name], MD;  Location: [facility] OR;  Service: Open Heart Surgery;  Laterality: N/A;  . Anterior cervical decomp/discectomy fusion    . Ankle fracture surgery Right ~ 1977    "10 plates and 7 screws placed in it"  . Tonsillectomy    . Back surgery    . Fracture surgery    . Cataract extraction w/ intraocular lens  implant, bilateral Bilateral 2000's  . Mohs surgery  X 2    "nose; nose"  . Skin cancer excision      "all over my head; ears;  back  . Esophagogastroduodenoscopy (egd) with esophageal dilation  X 1    Family History  Problem Relation Age of Onset  . CAD Mother   . Cirrhosis Brother     Social History:  reports that he quit smoking about 6 months ago. His smoking use included Cigarettes. He has a 50 pack-year smoking history. He has never used smokeless tobacco. He reports that he does not drink alcohol or use illicit drugs.  Allergies: No Known Allergies  Medications: I have reviewed the patient's current medications. Prior to Admission:  (Not in a hospital admission) Scheduled:   Results for orders placed or performed during the hospital encounter of 08/29/14 (from the past 48 hour(s))  Basic metabolic panel     Status: Abnormal   Collection Time: 08/29/14  6:44 PM  Result Value Ref Range   Sodium 135 (L) 137 - 147 mEq/L   Potassium 4.9 3.7 - 5.3 mEq/L   Chloride 93 (L) 96 - 112 mEq/L   CO2 22 19 - 32 mEq/L   Glucose, Bld 493 (H) 70 - 99 mg/dL   BUN 45 (H) 6 - 23 mg/dL   Creatinine, Ser 4.33 (H) 0.50 - 1.35 mg/dL   Calcium 9.2 8.4 - 29.5 mg/dL   GFR calc non Af Amer 37 (L) >90 mL/min   GFR calc Af Amer 43 (L) >90 mL/min    Comment: (NOTE) The eGFR has been calculated using the CKD EPI equation. This calculation has not been validated in all clinical situations. eGFR's persistently <90 mL/min signify possible Chronic Kidney Disease.    Anion gap 20 (H) 5 - 15  CBC     Status: Abnormal   Collection Time: 08/29/14  6:44 PM  Result Value Ref Range   WBC 10.3 4.0 - 10.5 K/uL   RBC 4.23 4.22 - 5.81 MIL/uL   Hemoglobin 10.1 (L) 13.0 - 17.0 g/dL   HCT 18.8 (L) 41.6 - 60.6 %   MCV 73.8 (L) 78.0 - 100.0 fL   MCH 23.9 (L) 26.0 - 34.0 pg   MCHC 32.4 30.0 - 36.0 g/dL   RDW 30.1 (H) 60.1 - 09.3 %   Platelets 385 150 - 400 K/uL  I-stat troponin, ED (not at [facility])     Status: None   Collection Time: 08/29/14  6:58 PM  Result Value Ref Range   Troponin i, poc 0.02 0.00 - 0.08 ng/mL   Comment 3              Comment: Due to the release kinetics of cTnI, a negative result within the first hours of the onset of symptoms does not rule out myocardial infarction with certainty. If myocardial infarction is still  suspected, repeat the test at appropriate intervals.   Urinalysis, Routine w reflex microscopic     Status: Abnormal   Collection Time: 08/29/14  8:52 PM  Result Value Ref Range   Color, Urine YELLOW YELLOW   APPearance CLOUDY (A) CLEAR   Specific Gravity, Urine 1.024 1.005 - 1.030   pH 5.5 5.0 - 8.0   Glucose, UA >1000 (A) NEGATIVE mg/dL   Hgb urine dipstick NEGATIVE NEGATIVE   Bilirubin Urine NEGATIVE NEGATIVE   Ketones, ur NEGATIVE NEGATIVE mg/dL   Protein, ur >300 (A) NEGATIVE mg/dL   Urobilinogen, UA 0.2 0.0 - 1.0 mg/dL   Nitrite POSITIVE (A) NEGATIVE   Leukocytes, UA NEGATIVE NEGATIVE  Urine microscopic-add on     Status: Abnormal   Collection Time: 08/29/14  8:52 PM  Result Value Ref Range   Squamous Epithelial / LPF RARE RARE   WBC, UA 11-20 <3 WBC/hpf   Bacteria, UA MANY (A) RARE   Casts GRANULAR CAST (A) NEGATIVE  Troponin I     Status: Abnormal   Collection Time: 08/29/14  9:29 PM  Result Value Ref Range   Troponin I 0.47 (HH) <0.30 ng/mL    Comment:        Due to the release kinetics of cTnI, a negative result within the first hours of the onset of symptoms does not rule out myocardial infarction with certainty. If myocardial infarction is still suspected, repeat the test at appropriate intervals. CRITICAL RESULT CALLED TO, READ BACK BY AND VERIFIED WITH: CHALMBER B,RN 08/29/14 2225 WAYK     Dg Chest Port 1 View  08/29/2014   CLINICAL DATA:  Left-sided chest pain today.  EXAM: PORTABLE CHEST - 1 VIEW  COMPARISON:  08/26/2014  FINDINGS: Lungs are adequately inflated without consolidation or effusion. Borderline cardiomegaly unchanged. Remainder of the exam is unchanged.  IMPRESSION: No acute cardiopulmonary disease.  Borderline cardiomegaly.    Electronically Signed   By: Marin Olp M.D.   On: 08/29/2014 19:18    Review of Systems  Constitutional: Negative for fever, chills and weight loss.  HENT: Negative for ear discharge.   Eyes: Negative for blurred vision and double vision.  Respiratory: Negative for cough and hemoptysis.   Cardiovascular: Positive for chest pain.  Gastrointestinal: Negative for heartburn, nausea and vomiting.  Genitourinary: Negative for hematuria.  Musculoskeletal: Negative for myalgias and neck pain.  Skin: Negative for rash.  Neurological: Negative for dizziness, tingling and headaches.  Endo/Heme/Allergies: Negative for polydipsia.  Psychiatric/Behavioral: Negative for depression, suicidal ideas and substance abuse.   Blood pressure 163/48, pulse 65, temperature 97.8 F (36.6 C), temperature source Oral, resp. rate 22, height $RemoveBe'5\' 6"'[id number]$  (1.676 m), weight 73.936 kg (163 lb), SpO2 95 %. Physical Exam  Nursing note and vitals reviewed. Constitutional: He is oriented to person, place, and time. He appears well-developed and well-nourished. No distress.  HENT:  Head: Normocephalic and atraumatic.  Nose: Nose normal.  Mouth/Throat: Oropharynx is clear and moist. No oropharyngeal exudate.  Eyes: Conjunctivae and EOM are normal. Pupils are equal, round, and reactive to light. No scleral icterus.  Neck: Normal range of motion. Neck supple. No JVD present. No tracheal deviation present.  Cardiovascular: Normal rate, regular rhythm and intact distal pulses.   Murmur heard. Respiratory: Effort normal and breath sounds normal. No respiratory distress. He has no wheezes. He has no rales.  GI: Soft. Bowel sounds are normal. He exhibits no distension. There is no tenderness. There is no rebound.  Musculoskeletal: Normal range of  motion. He exhibits no edema or tenderness.  Neurological: He is alert and oriented to person, place, and time. He has normal reflexes. No cranial nerve deficit.  Skin: Skin is warm and  dry. No rash noted. He is not diaphoretic. No erythema.  Psychiatric: He has a normal mood and affect. His behavior is normal. Thought content normal.   Labs reviewed; cbg > 400, bun/cr 45/1.74, na 135, K 4.9, h/h 10.1/31, plt 385, wbc 10.3, urinalysis 1.024, 10-20 wbc Trop 0.47 Chest x-ray unrevealing ECG: RBBB, sinus tach 102, ST depression likely related to RBBB given similar to previous Echo 11/09: EF 45-50%, grade II DD, mild MR 07/23/14 LHC by Dr. Tamala Julian: 95% left main distally before origin of LCx/LAD bifurcation, LAD obstructed 90% ostium, 100% after origin of D1 (totally occluded), LCx is 90% at ostium, 99% mid vessel before origin of 3 tiny distal OMs, major obtuse marginal branches are supplied by bypass grafts. RCA totally occluded proximally (via previous LHC). SVG to D1 is patent, SVG to OM patent, SVG to PDA patent, SVG sequential to D2/D3 patent, SVG to PDA is 99% obstructed and sluggish flow, LIMA to LAD patent with distal LAD having a 70% stenosis distal insertion site .   Assessment/Plan: 1. Chest Pain 2. Hyperglycemia 3. Chronic Heart Failure - diastolic 4. T2DM 5. Dyslipidemia 6. Coronary Artery disease 7. UTI [name] is a 73 yo man with PMH of CAD s/p CABG x2 with repeat '15 (initial '99 with LIMA to LAD and sequential SVG to D2/D3, sequential SVG to PDA, acute marginal, PL branch), repeat CABG '15 with SVG to D1, SVG to PDA and SVG to OM, native Lcx with taxus stent '07, angioplasty of SVG to native RCA '06, DES to SVG of RCA '07 and '09, stent in SVG to RCA '12 complicated by no reflow who presents with chest pain after admission to hospital from 11/9-11/11 for HF exacerbation requiring diuresis. Differential for chest pain includes musculoskeletal pain, esophageal spasm, GERD, pericarditis, ACS/NSTEMI among other etiologies. I favor a diagnosis typical anginal chest pain. However, he's been having this pain and had 10/06 LHC as above by Dr. Tamala Julian. He appears to  have microvascular disease or small vessels not amenable to stenting leading to symptoms. This is probably medical management unless overt NSTEMI evolves given significant CKD and patent grafts at very recent LHC.   - NPO overnight - trend cardiac markers - observation on telemetry - continue asa 81 mg, continue atorva 20 mg  - NTG gtt for pain - Continue amlodipine, metoprolol 50 mg bid, imdur 120 mg, hydralazine - hold glipizide - continue lasix 40 mg once daily  - if recurrent chest pain, would consider starting heparin or if large bump in troponins  Carlye Panameno 08/29/2014, 10:36 PM

## 2014-08-29 NOTE — Telephone Encounter (Signed)
Patient called the answering service with question about persistent CP. He has known CAD s/p CABG. Cath in 07/2014 showed: 1. Patent saphenous vein graft to the diagonal, saphenous vein graft to the obtuse marginal, and saphenous vein graft to the PDA. 2. Severe native left main, ostial circumflex and ostial LAD with progression since bypass surgery. Territories beyond the stenoses are supplied by patent grafts with the exception of three tiny obtuse marginals in the distal circumflex territory (likely source of pain). 3. Total occlusion of the RCA in the midsegment. 4. 95% ostial circumflex stenosis and 99% mid vessel stenosis. Total occlusion of the dominant obtuse marginal branch (supplied by saphenous vein graft). 5. Widely patent LIMA to LAD. The distal LAD beyond the graft insertion site is totally occluded and not collateralized. The LAD is filled retrograde from the anastomosis. 6. Left ventriculography was not performed because of renal insufficiency  He has done well on medical therapy from a chest pain standpoint, including BB/Imdur/amlodipine. He was recently admitted with CHF but went home feeling better.  This evening he developed significant chest pain. He has taken multiple NTG. In the past, he says he has been told to take an extra Imdur and extra metoprolol when this happens. He has done so but unfortunately continues to have chest pain. Unfortunately at this point he has failed outpatient medical therapy and we need to make sure there is nothing new going on. I think the safest thing for him is to come to the ED for evaluation. He agrees with the plan and will be calling someone for transport. Carston Riedl PA-C

## 2014-08-29 NOTE — Telephone Encounter (Signed)
Left message on patients cell phone to call back

## 2014-08-30 ENCOUNTER — Encounter (HOSPITAL_COMMUNITY): Payer: Self-pay | Admitting: *Deleted

## 2014-08-30 ENCOUNTER — Encounter (HOSPITAL_COMMUNITY)
Admission: EM | Disposition: A | Discharge status: Home / Self Care | Payer: Self-pay | Source: Home / Self Care | Attending: Internal Medicine

## 2014-08-30 DIAGNOSIS — I214 Non-ST elevation (NSTEMI) myocardial infarction: Principal | ICD-10-CM

## 2014-08-30 DIAGNOSIS — D649 Anemia, unspecified: Secondary | ICD-10-CM

## 2014-08-30 DIAGNOSIS — I25119 Atherosclerotic heart disease of native coronary artery with unspecified angina pectoris: Secondary | ICD-10-CM

## 2014-08-30 DIAGNOSIS — R739 Hyperglycemia, unspecified: Secondary | ICD-10-CM

## 2014-08-30 DIAGNOSIS — E1151 Type 2 diabetes mellitus with diabetic peripheral angiopathy without gangrene: Secondary | ICD-10-CM

## 2014-08-30 DIAGNOSIS — M353 Polymyalgia rheumatica: Secondary | ICD-10-CM

## 2014-08-30 HISTORY — PX: LEFT HEART CATHETERIZATION WITH CORONARY/GRAFT ANGIOGRAM: SHX5450

## 2014-08-30 LAB — COMPREHENSIVE METABOLIC PANEL
ALK PHOS: 69 U/L (ref 39–117)
ALT: 37 U/L (ref 0–53)
AST: 23 U/L (ref 0–37)
Albumin: 2.8 g/dL — ABNORMAL LOW (ref 3.5–5.2)
Anion gap: 14 (ref 5–15)
BUN: 41 mg/dL — ABNORMAL HIGH (ref 6–23)
CHLORIDE: 101 meq/L (ref 96–112)
CO2: 28 mEq/L (ref 19–32)
Calcium: 9.1 mg/dL (ref 8.4–10.5)
Creatinine, Ser: 1.51 mg/dL — ABNORMAL HIGH (ref 0.50–1.35)
GFR calc Af Amer: 51 mL/min — ABNORMAL LOW (ref >90)
GFR, EST NON AFRICAN AMERICAN: 44 mL/min — AB (ref >90)
GLUCOSE: 121 mg/dL — AB (ref 70–99)
POTASSIUM: 3.7 meq/L (ref 3.7–5.3)
SODIUM: 143 meq/L (ref 137–147)
Total Bilirubin: <0.2 mg/dL — ABNORMAL LOW (ref 0.3–1.2)
Total Protein: 6.2 g/dL (ref 6.0–8.3)

## 2014-08-30 LAB — CBC
HEMATOCRIT: 27.2 % — AB (ref 39.0–52.0)
HEMATOCRIT: 27.8 % — AB (ref 39.0–52.0)
HEMOGLOBIN: 8.8 g/dL — AB (ref 13.0–17.0)
HEMOGLOBIN: 8.9 g/dL — AB (ref 13.0–17.0)
MCH: 23.8 pg — ABNORMAL LOW (ref 26.0–34.0)
MCH: 23.7 pg — ABNORMAL LOW (ref 26.0–34.0)
MCHC: 32.4 g/dL (ref 30.0–36.0)
MCHC: 32 g/dL (ref 30.0–36.0)
MCV: 73.5 fL — ABNORMAL LOW (ref 78.0–100.0)
MCV: 73.9 fL — AB (ref 78.0–100.0)
Platelets: 349 10*3/uL (ref 150–400)
Platelets: 358 10*3/uL (ref 150–400)
RBC: 3.7 MIL/uL — AB (ref 4.22–5.81)
RBC: 3.76 MIL/uL — AB (ref 4.22–5.81)
RDW: 16.3 % — ABNORMAL HIGH (ref 11.5–15.5)
RDW: 16.5 % — ABNORMAL HIGH (ref 11.5–15.5)
WBC: 9.3 10*3/uL (ref 4.0–10.5)
WBC: 9.1 10*3/uL (ref 4.0–10.5)

## 2014-08-30 LAB — TROPONIN I
Troponin I: 2.15 ng/mL (ref <0.30)
Troponin I: 2.18 ng/mL (ref <0.30)
Troponin I: 1.68 ng/mL (ref <0.30)

## 2014-08-30 LAB — GLUCOSE, CAPILLARY
GLUCOSE-CAPILLARY: 109 mg/dL — AB (ref 70–99)
GLUCOSE-CAPILLARY: 252 mg/dL — AB (ref 70–99)
Glucose-Capillary: 121 mg/dL — ABNORMAL HIGH (ref 70–99)
Glucose-Capillary: 131 mg/dL — ABNORMAL HIGH (ref 70–99)
Glucose-Capillary: 109 mg/dL — ABNORMAL HIGH (ref 70–99)

## 2014-08-30 LAB — IRON AND TIBC
Iron: 75 ug/dL (ref 42–135)
SATURATION RATIOS: 25 % (ref 20–55)
TIBC: 301 ug/dL (ref 215–435)
UIBC: 226 ug/dL (ref 125–400)

## 2014-08-30 LAB — HEMOGLOBIN A1C
Hgb A1c MFr Bld: 7.3 % — ABNORMAL HIGH (ref <5.7)
MEAN PLASMA GLUCOSE: 163 mg/dL — AB (ref <117)

## 2014-08-30 LAB — FERRITIN: FERRITIN: 24 ng/mL (ref 22–322)

## 2014-08-30 LAB — TSH: TSH: 2.86 u[IU]/mL (ref 0.350–4.500)

## 2014-08-30 LAB — CBG MONITORING, ED
Glucose-Capillary: 100 mg/dL — ABNORMAL HIGH (ref 70–99)
Glucose-Capillary: 126 mg/dL — ABNORMAL HIGH (ref 70–99)
Glucose-Capillary: 191 mg/dL — ABNORMAL HIGH (ref 70–99)
Glucose-Capillary: 261 mg/dL — ABNORMAL HIGH (ref 70–99)
Glucose-Capillary: 297 mg/dL — ABNORMAL HIGH (ref 70–99)

## 2014-08-30 LAB — MRSA PCR SCREENING: MRSA by PCR: NEGATIVE

## 2014-08-30 SURGERY — LEFT HEART CATHETERIZATION WITH CORONARY/GRAFT ANGIOGRAM
Anesthesia: LOCAL

## 2014-08-30 MED ORDER — NITROGLYCERIN 0.4 MG SL SUBL
0.4000 mg | SUBLINGUAL_TABLET | SUBLINGUAL | Status: DC | PRN
  Administered 2014-08-31 (×2): 0.4 mg via SUBLINGUAL
  Filled 2014-08-30: qty 1

## 2014-08-30 MED ORDER — PANTOPRAZOLE SODIUM 40 MG PO TBEC
40.0000 mg | DELAYED_RELEASE_TABLET | Freq: Every day | ORAL | Status: DC
  Administered 2014-08-30 – 2014-09-03 (×5): 40 mg via ORAL
  Filled 2014-08-30 (×5): qty 1

## 2014-08-30 MED ORDER — INSULIN ASPART 100 UNIT/ML ~~LOC~~ SOLN: 0.0000 [IU] | Freq: Three times a day (TID) | SUBCUTANEOUS | Status: DC

## 2014-08-30 MED ORDER — ZOLPIDEM TARTRATE 5 MG PO TABS: 5.0000 mg | ORAL_TABLET | Freq: Every evening | ORAL | Status: DC | PRN

## 2014-08-30 MED ORDER — ATORVASTATIN CALCIUM 20 MG PO TABS
20.0000 mg | ORAL_TABLET | Freq: Every day | ORAL | Status: DC
  Administered 2014-08-30 – 2014-09-02 (×4): 20 mg via ORAL
  Filled 2014-08-30 (×4): qty 1

## 2014-08-30 MED ORDER — IRBESARTAN 150 MG PO TABS
150.0000 mg | ORAL_TABLET | Freq: Every morning | ORAL | Status: DC
  Administered 2014-08-30 – 2014-09-03 (×5): 150 mg via ORAL
  Filled 2014-08-30 (×5): qty 1

## 2014-08-30 MED ORDER — PREDNISONE 5 MG PO TABS
15.0000 mg | ORAL_TABLET | Freq: Every day | ORAL | Status: DC
  Administered 2014-08-30 – 2014-09-01 (×3): 15 mg via ORAL
  Filled 2014-08-30 (×4): qty 1

## 2014-08-30 MED ORDER — ISOSORBIDE MONONITRATE ER 60 MG PO TB24
120.0000 mg | ORAL_TABLET | Freq: Every day | ORAL | Status: DC
  Administered 2014-08-30 – 2014-09-03 (×5): 120 mg via ORAL
  Filled 2014-08-30 (×6): qty 2

## 2014-08-30 MED ORDER — DEXTROSE-NACL 5-0.45 % IV SOLN
INTRAVENOUS | Status: DC
  Administered 2014-08-30: 03:00:00 via INTRAVENOUS

## 2014-08-30 MED ORDER — INSULIN GLARGINE 100 UNIT/ML ~~LOC~~ SOLN
10.0000 [IU] | Freq: Every day | SUBCUTANEOUS | Status: DC
  Administered 2014-08-30: 10 [IU] via SUBCUTANEOUS
  Filled 2014-08-30 (×2): qty 0.1

## 2014-08-30 MED ORDER — HEPARIN (PORCINE) IN NACL 2-0.9 UNIT/ML-% IJ SOLN
INTRAMUSCULAR | Status: AC
  Filled 2014-08-30: qty 1500

## 2014-08-30 MED ORDER — INSULIN ASPART 100 UNIT/ML ~~LOC~~ SOLN
0.0000 [IU] | SUBCUTANEOUS | Status: DC
  Administered 2014-08-30: 3 [IU] via SUBCUTANEOUS
  Administered 2014-08-30: 1 [IU] via SUBCUTANEOUS
  Administered 2014-08-30: 5 [IU] via SUBCUTANEOUS
  Administered 2014-08-31: 7 [IU] via SUBCUTANEOUS
  Administered 2014-08-31: 9 [IU] via SUBCUTANEOUS
  Administered 2014-08-31: 2 [IU] via SUBCUTANEOUS
  Administered 2014-08-31: 1 [IU] via SUBCUTANEOUS
  Administered 2014-08-31: 5 [IU] via SUBCUTANEOUS
  Administered 2014-09-01: 2 [IU] via SUBCUTANEOUS
  Administered 2014-09-01: 1 [IU] via SUBCUTANEOUS
  Administered 2014-09-01: 11 [IU] via SUBCUTANEOUS
  Administered 2014-09-01 (×2): 1 [IU] via SUBCUTANEOUS
  Administered 2014-09-01: 5 [IU] via SUBCUTANEOUS
  Administered 2014-09-02: 1 [IU] via SUBCUTANEOUS

## 2014-08-30 MED ORDER — ADULT MULTIVITAMIN W/MINERALS CH
1.0000 | ORAL_TABLET | Freq: Every day | ORAL | Status: DC
  Administered 2014-08-30 – 2014-09-03 (×5): 1 via ORAL
  Filled 2014-08-30 (×5): qty 1

## 2014-08-30 MED ORDER — VITAMIN D 1000 UNITS PO TABS
1000.0000 [IU] | ORAL_TABLET | Freq: Every day | ORAL | Status: DC
Start: 2014-08-30 — End: 2014-09-03
  Administered 2014-08-30 – 2014-09-03 (×5): 1000 [IU] via ORAL
  Filled 2014-08-30 (×5): qty 1

## 2014-08-30 MED ORDER — MIDAZOLAM HCL 2 MG/2ML IJ SOLN
INTRAMUSCULAR | Status: AC
  Filled 2014-08-30: qty 2

## 2014-08-30 MED ORDER — HEPARIN SODIUM (PORCINE) 1000 UNIT/ML IJ SOLN
INTRAMUSCULAR | Status: AC
  Filled 2014-08-30: qty 1

## 2014-08-30 MED ORDER — SODIUM CHLORIDE 0.9 % IJ SOLN: 3.0000 mL | Freq: Two times a day (BID) | INTRAMUSCULAR | Status: DC

## 2014-08-30 MED ORDER — SODIUM CHLORIDE 0.9 % IJ SOLN
3.0000 mL | Freq: Two times a day (BID) | INTRAMUSCULAR | Status: DC
  Administered 2014-08-30 – 2014-09-03 (×9): 3 mL via INTRAVENOUS

## 2014-08-30 MED ORDER — FENTANYL CITRATE 0.05 MG/ML IJ SOLN
INTRAMUSCULAR | Status: AC
  Filled 2014-08-30: qty 2

## 2014-08-30 MED ORDER — OXYCODONE HCL 5 MG PO TABS
5.0000 mg | ORAL_TABLET | ORAL | Status: DC | PRN
Start: 2014-08-30 — End: 2014-09-03

## 2014-08-30 MED ORDER — HEPARIN (PORCINE) IN NACL 100-0.45 UNIT/ML-% IJ SOLN
900.0000 [IU]/h | INTRAMUSCULAR | Status: DC
  Administered 2014-08-30: 900 [IU]/h via INTRAVENOUS
  Filled 2014-08-30: qty 250

## 2014-08-30 MED ORDER — ACETAMINOPHEN 650 MG RE SUPP: 650.0000 mg | Freq: Four times a day (QID) | RECTAL | Status: DC | PRN

## 2014-08-30 MED ORDER — HEPARIN SODIUM (PORCINE) 5000 UNIT/ML IJ SOLN
5000.0000 [IU] | Freq: Three times a day (TID) | INTRAMUSCULAR | Status: DC
  Administered 2014-08-30: 5000 [IU] via SUBCUTANEOUS
  Filled 2014-08-30: qty 1

## 2014-08-30 MED ORDER — VITAMIN B-1 100 MG PO TABS
100.0000 mg | ORAL_TABLET | Freq: Every day | ORAL | Status: DC
  Administered 2014-08-30 – 2014-09-03 (×5): 100 mg via ORAL
  Filled 2014-08-30 (×5): qty 1

## 2014-08-30 MED ORDER — FOLIC ACID 1 MG PO TABS
1.0000 mg | ORAL_TABLET | Freq: Every day | ORAL | Status: DC
  Administered 2014-08-30 – 2014-09-03 (×5): 1 mg via ORAL
  Filled 2014-08-30 (×5): qty 1

## 2014-08-30 MED ORDER — ONDANSETRON HCL 4 MG PO TABS: 4.0000 mg | ORAL_TABLET | Freq: Four times a day (QID) | ORAL | Status: DC | PRN

## 2014-08-30 MED ORDER — SODIUM CHLORIDE 0.9 % IV SOLN: INTRAVENOUS | Status: AC

## 2014-08-30 MED ORDER — METOPROLOL TARTRATE 50 MG PO TABS
50.0000 mg | ORAL_TABLET | Freq: Two times a day (BID) | ORAL | Status: DC
  Administered 2014-08-30 – 2014-09-01 (×7): 50 mg via ORAL
  Filled 2014-08-30 (×5): qty 1
  Filled 2014-08-30: qty 2
  Filled 2014-08-30 (×2): qty 1
  Filled 2014-08-30: qty 2

## 2014-08-30 MED ORDER — NITROGLYCERIN 1 MG/10 ML FOR IR/CATH LAB
INTRA_ARTERIAL | Status: AC
  Filled 2014-08-30: qty 10

## 2014-08-30 MED ORDER — SODIUM CHLORIDE 0.9 % IV SOLN
INTRAVENOUS | Status: DC
  Administered 2014-08-29 – 2014-08-31 (×3): via INTRAVENOUS

## 2014-08-30 MED ORDER — MORPHINE SULFATE 2 MG/ML IJ SOLN
2.0000 mg | INTRAMUSCULAR | Status: DC | PRN
  Administered 2014-08-31: 2 mg via INTRAVENOUS
  Filled 2014-08-30: qty 1

## 2014-08-30 MED ORDER — INSULIN ASPART 100 UNIT/ML ~~LOC~~ SOLN: 0.0000 [IU] | Freq: Every day | SUBCUTANEOUS | Status: DC

## 2014-08-30 MED ORDER — LIDOCAINE HCL (PF) 1 % IJ SOLN
INTRAMUSCULAR | Status: AC
  Filled 2014-08-30: qty 30

## 2014-08-30 MED ORDER — SODIUM CHLORIDE 0.9 % IV SOLN: 250.0000 mL | INTRAVENOUS | Status: DC | PRN

## 2014-08-30 MED ORDER — FUROSEMIDE 10 MG/ML IJ SOLN
40.0000 mg | Freq: Two times a day (BID) | INTRAMUSCULAR | Status: DC
  Administered 2014-08-30 – 2014-08-31 (×2): 40 mg via INTRAVENOUS
  Filled 2014-08-30 (×3): qty 4

## 2014-08-30 MED ORDER — ONDANSETRON HCL 4 MG/2ML IJ SOLN: 4.0000 mg | Freq: Four times a day (QID) | INTRAMUSCULAR | Status: DC | PRN

## 2014-08-30 MED ORDER — DEXTROSE 50 % IV SOLN: 25.0000 mL | INTRAVENOUS | Status: DC | PRN

## 2014-08-30 MED ORDER — HYDRALAZINE HCL 50 MG PO TABS
50.0000 mg | ORAL_TABLET | Freq: Three times a day (TID) | ORAL | Status: DC
  Administered 2014-08-30 – 2014-09-01 (×7): 50 mg via ORAL
  Filled 2014-08-30 (×11): qty 1

## 2014-08-30 MED ORDER — ASPIRIN EC 325 MG PO TBEC
325.0000 mg | DELAYED_RELEASE_TABLET | Freq: Every day | ORAL | Status: DC
  Administered 2014-08-30 – 2014-09-03 (×5): 325 mg via ORAL
  Filled 2014-08-30 (×5): qty 1

## 2014-08-30 MED ORDER — ALBUTEROL SULFATE (2.5 MG/3ML) 0.083% IN NEBU
2.5000 mg | INHALATION_SOLUTION | Freq: Four times a day (QID) | RESPIRATORY_TRACT | Status: DC | PRN
Start: 2014-08-30 — End: 2014-09-03

## 2014-08-30 MED ORDER — PREDNISONE 20 MG PO TABS
20.0000 mg | ORAL_TABLET | Freq: Every day | ORAL | Status: DC
  Filled 2014-08-30 (×2): qty 1

## 2014-08-30 MED ORDER — AMLODIPINE BESYLATE 10 MG PO TABS
10.0000 mg | ORAL_TABLET | Freq: Every day | ORAL | Status: DC
  Administered 2014-08-30 – 2014-09-03 (×5): 10 mg via ORAL
  Filled 2014-08-30 (×5): qty 1

## 2014-08-30 MED ORDER — SODIUM CHLORIDE 0.9 % IJ SOLN: 3.0000 mL | INTRAMUSCULAR | Status: DC | PRN

## 2014-08-30 MED ORDER — HYDRALAZINE HCL 20 MG/ML IJ SOLN: 10.0000 mg | Freq: Four times a day (QID) | INTRAMUSCULAR | Status: DC | PRN

## 2014-08-30 MED ORDER — ACETAMINOPHEN 325 MG PO TABS
650.0000 mg | ORAL_TABLET | Freq: Four times a day (QID) | ORAL | Status: DC | PRN
  Administered 2014-08-30 – 2014-09-01 (×3): 650 mg via ORAL
  Filled 2014-08-30 (×3): qty 2

## 2014-08-30 MED ORDER — VERAPAMIL HCL 2.5 MG/ML IV SOLN
INTRAVENOUS | Status: AC
Start: 2014-08-30 — End: 2014-08-30
  Filled 2014-08-30: qty 2

## 2014-08-30 NOTE — Progress Notes (Signed)
ANTICOAGULATION CONSULT NOTE - Follow Up Consult  Pharmacy Consult for Heparin Indication: chest pain/ACS s/p cath   No Known Allergies  Patient Measurements: Height: 5\' 6"  (167.6 cm) Weight: 171 lb 1.2 oz (77.6 kg) IBW/kg (Calculated) : 63.8  Vital Signs: Temp: 97.9 F (36.6 C) (11/13 1659) Temp Source: Oral (11/13 1659) BP: 143/64 mmHg (11/13 1815) Pulse Rate: 73 (11/13 1815)  Labs:  Recent Labs  08/28/14 0414  08/29/14 1844  08/30/14 0330 08/30/14 0942 08/30/14 1237 08/30/14 1715  HGB  --   < > 10.1*  --  8.8*  --  8.9*  --   HCT  --   --  31.2*  --  27.2*  --  27.8*  --   PLT  --   --  385  --  349  --  358  --   CREATININE 1.58*  --  1.74*  --  1.51*  --   --   --   TROPONINI  --   --   --   < > 2.15* 2.18*  --  1.68*  < > = values in this interval not displayed.  Estimated Creatinine Clearance: 43.3 mL/min (by C-G formula based on Cr of 1.51).   Medical History: Past Medical History  Diagnosis Date  . Carotid artery occlusion     Bilateral 50-69% stenoses of the internal carotid arteries. He needs carotid Dopplers every 6 months.  . Hyperlipidemia   . Atherosclerosis of native arteries of the extremities with intermittent claudication Left external iliac artery stent in 2001    Bilateral SFA occlusions documented in 2001.  Marland Kitchen Hypertension   . GERD (gastroesophageal reflux disease)   . Esophageal stricture 09/24/2013    History of dilatation. Long-standing history of esophageal reflux   . 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 1999 Repeat CABG 2015 with SVG to D1, SVG to PDA, and SVG to OM.   Marland Kitchen Chronic diastolic heart failure 06/22/1739  . CAD (coronary artery disease), native coronary artery 09/24/2013    Native circumflex Taxus stent 2007, angioplasty of the SVG to native right coronary 2006, DES stent to the SVG of RCA 2007 and resplinted 2009  CABG in  1999. Stent in SVG to RCA in 8144 complicated by no reflow   . CHF (congestive heart failure)   . MI (myocardial infarction) 1999-02/2014    "I've had 6"  . Anginal pain   . Chronic bronchitis     "more times than I can count; ever since I was a kid"  . OSA (obstructive sleep apnea)     "don't wear mask" (08/26/2014)  . Type II diabetes mellitus   . Anemia   . History of hiatal hernia   . Daily headache     "lately" (08/26/2014)  . Arthritis     "qwhere"  . Chronic lower back pain   . CKD (chronic kidney disease) stage 3, GFR 30-59 ml/min   . Skin cancer   . 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 Polymyalgia     Medications:  Scheduled:  . amLODipine  10 mg Oral Daily  . aspirin EC  325 mg Oral Daily  . atorvastatin  20 mg Oral q1800  . cefTRIAXone (ROCEPHIN)  IV  1 g Intravenous Q24H  . cholecalciferol  1,000 Units Oral Daily  . folic acid  1 mg Oral Daily  . furosemide  40 mg Intravenous BID  . hydrALAZINE  50 mg Oral TID  . insulin aspart  0-9 Units Subcutaneous 6 times per day  . insulin glargine  10 Units Subcutaneous QHS  . irbesartan  150 mg Oral q morning - 10a  . isosorbide mononitrate  120 mg Oral Daily  . metoprolol  50 mg Oral BID  . multivitamin with minerals  1 tablet Oral Daily  . pantoprazole  40 mg Oral Daily  . predniSONE  15 mg Oral Q breakfast  . sodium chloride  3 mL Intravenous Q12H  . thiamine  100 mg Oral Daily   Infusions:  . sodium chloride 50 mL/hr at 08/30/14 1500  . sodium chloride 50 mL/hr at 08/30/14 1800    Assessment: 73 yo M admitted on 08/29/2014 with CP that started the morning of admit. Pt is s/p left heart catheterization. Pharmacy consulted to resume heparin infusion 6 hours s/p TR band removal for 48 hours. H/H has slightly trended down, plt wnl and stable with no reported s/s bleeding.   Goal of Therapy:  Heparin level 0.3-0.7 units/ml Monitor platelets by  anticoagulation protocol: Yes   Plan:  - Resume heparin at 900 u/hr without bolus starting at 0030 tomorrow  - HL 8 hours post resumption of heparin  - Daily HL/CBC/s/s bleeding  Albertina Parr, PharmD.  Clinical Pharmacist Pager [phone]

## 2014-08-30 NOTE — H&P (View-Only) (Signed)
SUBJECTIVE: Pt seen and examined. Pt in NSR on telemetry. He reports he no longer has chest pain and denies dyspnea, palpitations, or LE edema. His Hg is 8.8 from 10.1 yesterday but denies active bleeding. He wants to have cardiac catherization and no longer wants to be on prednisone because all his problems began after starting it. His troponin this morning is 2.18.    OBJECTIVE:   Vitals:   Filed Vitals:   08/30/14 0540 08/30/14 0636 08/30/14 0741 08/30/14 1141  BP:  161/58 168/58 166/54  Pulse: 72  74 65  Temp: 97.9 F (36.6 C)  98.4 F (36.9 C) 97.9 F (36.6 C)  TempSrc: Oral  Oral Oral  Resp:   20 21  Height: 5\' 6"  (1.676 m)     Weight: 171 lb 1.2 oz (77.6 kg)     SpO2: 94%  95% 94%   I&O's:   Intake/Output Summary (Last 24 hours) at 08/30/14 1248 Last data filed at 08/30/14 0919  Gross per 24 hour  Intake    200 ml  Output   1500 ml  Net  -1300 ml   TELEMETRY: Reviewed telemetry pt in NSR:     PHYSICAL EXAM General: Well developed, well nourished, in no acute distress Head:   Normal cephalic and atramatic  Lungs:  Clear bilaterally to auscultation. Heart:  HRRR S1 S2  No JVD. Systolic ejection murmur heard.    Abdomen: abdomen soft and non-tender Msk:  Back normal,  Normal strength and tone for age. Extremities:  No edema.   Neuro: Alert and oriented. Psych:  Normal affect, responds appropriately Skin: No rash   LABS: Basic Metabolic Panel:  Recent Labs  08/29/14 1844 08/30/14 0330  NA 135* 143  K 4.9 3.7  CL 93* 101  CO2 22 28  GLUCOSE 493* 121*  BUN 45* 41*  CREATININE 1.74* 1.51*  CALCIUM 9.2 9.1   Liver Function Tests:  Recent Labs  08/30/14 0330  AST 23  ALT 37  ALKPHOS 69  BILITOT <0.2*  PROT 6.2  ALBUMIN 2.8*   No results for input(s): LIPASE, AMYLASE in the last 72 hours. CBC:  Recent Labs  08/29/14 1844 08/30/14 0330  WBC 10.3 9.3  HGB 10.1* 8.8*  HCT 31.2* 27.2*  MCV 73.8* 73.5*  PLT 385 349   Cardiac  Enzymes:  Recent Labs  08/29/14 2129 08/30/14 0330 08/30/14 0942  TROPONINI 0.47* 2.15* 2.18*   BNP: Invalid input(s): POCBNP D-Dimer: No results for input(s): DDIMER in the last 72 hours. Hemoglobin A1C: No results for input(s): HGBA1C in the last 72 hours. Fasting Lipid Panel: No results for input(s): CHOL, HDL, LDLCALC, TRIG, CHOLHDL, LDLDIRECT in the last 72 hours. Thyroid Function Tests:  Recent Labs  08/30/14 0321  TSH 2.860   Anemia Panel: No results for input(s): VITAMINB12, FOLATE, FERRITIN, TIBC, IRON, RETICCTPCT in the last 72 hours. Coag Panel:   Lab Results  Component Value Date   INR 1.1* 07/17/2014   INR 1.47 03/01/2014   INR 1.07 02/26/2014    RADIOLOGY: Dg Chest 2 View  08/26/2014   CLINICAL DATA:  Dyspnea for 4 hr, worse with lying down.  EXAM: CHEST  2 VIEW  COMPARISON:  PA and lateral chest 04/03/2014. Single view of the chest 03/02/2014. CT chest 03/20/2013.  FINDINGS: The patient is status post CABG. Heart size is normal. No pneumothorax or pleural effusion is identified. Kerley B-lines in the lower chest are noted. No focal airspace disease is seen.  IMPRESSION: Kerley B-lines in the lower chest most in keeping with mild interstitial edema.   Electronically Signed   By: Inge Rise M.D.   On: 08/26/2014 01:39   Dg Chest Port 1 View  08/29/2014   CLINICAL DATA:  Left-sided chest pain today.  EXAM: PORTABLE CHEST - 1 VIEW  COMPARISON:  08/26/2014  FINDINGS: Lungs are adequately inflated without consolidation or effusion. Borderline cardiomegaly unchanged. Remainder of the exam is unchanged.  IMPRESSION: No acute cardiopulmonary disease.  Borderline cardiomegaly.   Electronically Signed   By: Marin Olp M.D.   On: 08/29/2014 19:18    Principal Problem:   NSTEMI (non-ST elevated myocardial infarction) Active Problems:   Hyperlipidemia   CAD (coronary artery disease), native coronary artery   COPD (chronic obstructive pulmonary disease)   Type  2 diabetes mellitus with vascular disease   CKD (chronic kidney disease) stage 3, GFR 30-59 ml/min   Chronic combined systolic and diastolic congestive heart failure   Diabetes type 2, uncontrolled   Polymyalgia rheumatica   Anemia    ASSESSMENT: 73 yo man with pmh of CAD s/p CABG x 2 (1999 & May 2015) with last catherization in October, HTN, HL, combined chronic CHF (EF 45-50% with grade 2 diastolic), COPD, CKD Stage 3, OSA, PMR newly started on prednisone, recently discharged for CHF exacerbation on 11/11 who presents with CP found to have NSTEMI.   PLAN:    NSTEMI in setting of CAD s/p CABG x2  (1999 & May 2015)- Currently denies CP. Troponin peak of 2.18., continue to cycle. Last catherization was last month. Concern for graft stenosis, will start IV heparin. Pt scheduled for catherization pending improvement in Hg. Continue home aspirin 325 mg daily, lipitor 20 mg daily, and home anti-hypertensives. Trigger most likely recent CHF exacerbation in setting of newly started prednisone therapy. May potentially need dual AP therapy.  Chronic Combined CHF - Last echo on 08/26/14 with EF 45-50% and grade 2 diastolic dysfunction. Hold home lasix 40 mg daily. On IV fluids pre-catherization.   Hypertension - Pt on home amlodipine 10 mg, hydralazine 50 mg TID, irbesartan 150 mg daily, lopressor 50 mg BID, and imdur 120 mg daily. Hold home lasix 40 mg daily. Acute on Chronic Normocytic Anemia - Hg drop from 10.1 to 8.8 most likely hemodilution for IV fluids. FOBT and anemia panel pending. Continue to monitor.  Hyperlipidemia - Continue home lipitor 20 mg daily. CKD Stage 3 - Cr 1.51 near baseline 1.3-1.5. Continue to monitor.  Polymyalgia Rheumatica - Taper of prednisone 20 mg daily per primary team. Pt reports he does not want to be on corticosteroid therapy in setting of recent AE.  Type II DM - A1c pending. Currently on Lantus 10 U daily and sSensitive SSI. Management per primary team. Hold home  metformin and glipizide.   Juluis Mire, MD  Linus Mako [phone] 08/30/2014  12:48 PM  Attending note to follow  ATTENDING ATTESTATION:  I have seen and examined the patient along with Dr. Naaman Plummer on AM rounds.  We reviewed the chart, notes and new data.  I agree with her findings, examination & recommendations as noted above.  Brief Description: 73 y/o man with long-standing CAD - with recent re-do CABG in May 2015.  Cath 07/23/14 - patent grafts, recent CHF admission (likely related to steroid related volume overload) --> now admitted with SSx similar to prior 6 MIs -- now ruled in for #7 NSTEMI.   Key new complaints: No more CP, no  melena  Key examination changes: stable exam - as above  Key new findings / data: Hgb down from admission - rechecked, stable.  PLAN:  IV Heparin  Home CAD meds - ASA, statin, BB, ARB & CCB.  We discussed Early Invasive (Cath-PCI) Management vs. Early Conservative (IV Heparin, Nitrates & possible stress test) -- he would prefer Cath-PCI.  Plan LHC-Cor/Graft Angio +/- PCI today pending stable Hgb level => if SVG disease, will consider BMS given unexplained drop in Hgb & his history of "bleeding issues" on Plavix in the past.  The procedure with Risks/Benefits/Alternatives and Indications was reviewed with the patient and family.  All questions were answered.    Risks / Complications include, but not limited to: Death, MI, CVA/TIA, VF/VT (with defibrillation), Bradycardia (need for temporary pacer placement), contrast induced nephropathy - slightly increased with him given CKD 3, bleeding / bruising / hematoma / pseudoaneurysm, vascular or coronary injury (with possible emergent CT or Vascular Surgery), adverse medication reactions, infection.  Additional risks involving the use of radiation with the possibility of radiation burns and cancer were explained in detail.  Increased risk of Graft Angio & PCI was discussed.  The patient & family voiced  understanding and agree to proceed.     Leonie Man, M.D., M.S.  [address] [place], [place]  [address]  [phone]  08/30/2014 2:19 PM

## 2014-08-30 NOTE — Care Management Note (Addendum)
    Page 1 of 1   09/02/2014     10:35:14 AM CARE MANAGEMENT NOTE 09/02/2014  Patient:  [name], [name]   Account Number:  [id number]  Date Initiated:  08/30/2014  Documentation initiated by:  Elissa Hefty  Subjective/Objective Assessment:   adm w ch pain     Action/Plan:   lives w wife, pcp dr Shirline Frees   Anticipated DC Date:     Anticipated DC Plan:           Choice offered to / List presented to:             Status of service:   Medicare Important Message given?  YES (If response is "NO", the following Medicare IM given date fields will be blank) Date Medicare IM given:  09/02/2014 Medicare IM given by:  GRAVES-BIGELOW,Jadier Rockers Date Additional Medicare IM given:   Additional Medicare IM given by:    Discharge Disposition:    Per UR Regulation:  Reviewed for med. necessity/level of care/duration of stay  If discussed at [place] of Stay Meetings, dates discussed:   09/03/2014    Comments:  09-02-14 [place], RN, BSN [phone] Pt with SVT overnight- plan to increase BB and monitor overnight. CM will continue to monitor for disposition needs.

## 2014-08-30 NOTE — CV Procedure (Signed)
CARDIAC CATHETERIZATION REPORT  NAME:  [name]   MRN: [id number] DOB:  04/21/1941   ADMIT DATE: 08/29/2014 Procedure Date: 08/30/2014  INTERVENTIONAL CARDIOLOGIST: Leonie Man, M.D., MS PRIMARY CARE PROVIDER: Shirline Frees, MD PRIMARY CARDIOLOGIST: Olen Pel, MD  PATIENT:  [name] is a 73 y.o. male with a known history of coronary artery disease status post redo CABG in May 2015 with only remaining LIMA-LAD and SVG-D2-D3 with subtotally occluded SVG-PDA. He underwent three-vessel CABG with SVG-OM, SVG-D1, SVG-RPDA. He underwent right Physician in October 2015 for symptoms of unstable angina and was found to have patent grafts. He now presents shortly after discharge for heart failure exacerbation signs of symptoms of acute coronary syndrome/non-STEMI with positive troponins. Is now referred for invasive evaluation by catheterization.  PRE-OPERATIVE DIAGNOSIS:    non-STEMI  CAD-redo CABG  PROCEDURES PERFORMED:    Left Heart Catheterization with Native Coronary And Graft Angiography  via Left Radial Artery   PROCEDURE: The patient was brought to the [facility]nd [facility] in the fasting state and prepped and draped in the usual sterile fashion for Left Radial artery access. A modified Allen's test was performed on the left wrist demonstrating excellent collateral flow for radial access.   Sterile technique was used including antiseptics, cap, gloves, gown, hand hygiene, mask and sheet. Skin prep: Chlorhexidine.   Consent: Risks of procedure as well as the alternatives and risks of each were explained to the (patient/caregiver). Consent for procedure obtained.   Time Out: Verified patient identification, verified procedure, site/side was marked, verified correct patient position, special equipment/implants available, medications/allergies/relevent history reviewed, required imaging and test results available. Performed.  Access:    Left Radial Artery: 6 Fr Sheath -  Seldinger Technique (Angiocath Micropuncture Kit)  Radial Cocktail - 10 mL; IV Heparin 4000 Units   Left Heart Catheterization: 5Fr Catheters advanced or exchanged over a long exchange safety J-wire; TIG 4.0 catheter advanced first.  Left Coronary Artery Cineangiography: TIG 4.0 Catheter  Right Coronary Artery, SVG-D1, SVG-D2-D3 & SVG-OM Cineangiography: TIG 4.0 Catheter  SVG-RPDA: RCB Catheter LIMA-LAD Cineangiography: RCB Catheter redirected into Left Subclavian Artery.  LV Hemodynamics: RCB  Sheath removed in the right Physician lab with TR band placement for hemostasis.  TR Band: 1620  Hours; 11 mL air  FINDINGS:  Hemodynamics:   Central Aortic Pressure / Mean: 155/50/90 mmHg  Left Ventricular Pressure / LVEDP: 158/7/30 mmHg  Left Ventriculography: not performed   Coronary Anatomy: overall minimal change from cardiac catheterization in October 2015  Dominance: likely right  Left Main: 95% distal stenosis with large calcified lesion prior to bifurcation into the circumflex and LAD. LAD: ostial/proximal 90% 100% after small first D1 which is occluded with competitive flow. Several septal perforators arise prior to the occlusion.  LIMA-LAD (old): widely patent graft. His mild diffuse disease distally in the LAD with 70% stenosis near the apex. It bifurcates into 2 branches of the apex and provides faint collaterals to the RPDA distribution. Similar to previous catheterization  SVG-D1 (new): widely patent graft to a very small caliber vessel.  SVG-D2-D3 (old): widely patent graft to D2 with a 60-70% stenosis just prior to the anastomosis with D3. This is in a acute bend of the vessel, and will likely not able to be approached from a percutaneous PCI option. Left Circumflex: 90% ostial and 99% mid in the AV groove beyond the major marginal branch that appeared to have competitive flow. The AV groove circumflex  continues distally to give rise  to several small marginal and posterior lateral branches.  SVG-OM1 (new): widely patent graft to a very small caliber marginal branch with a graft to vessel mismatch at the anastomosis but no stenosis.   RCA: the native vessel is 100% occluded proximally  SVG-RPDA (new): widely patent graft to a small caliber distal PDA with minimal flow distally based on the small size.  With multiple flush shots, I was unable to she had any antegrade flow in the old SVG-RPDA.  After reviewing the initial angiography, besides the now probably total occlusion of the old SVG-PDA, the only other potential culprit lesion would be the sequential leg of the SVG-D2-D3.Marland Kitchen  Based on the size of the graft distally, and the angle to take through the old graft for a relatively small downstream distribution, this is thought to be best treated medically as it is not that different from previous catheterization.  MEDICATIONS:  Anesthesia:  Local Lidocaine 2 ml  Sedation:  1 mg IV Versed, 50 mcg IV fentanyl ;   Omnipaque Contrast: 125 ml  Anticoagulation:  IV Heparin 4000 Unit  Anti-Platelet Agent:  Aspirin 81 mg  PATIENT DISPOSITION:    The patient was transferred to the PACU holding area in a hemodynamicaly stable, chest pain free condition.  The patient tolerated the procedure well, and there were no complications.  EBL:   < 20 ml  The patient was stable before, during, and after the procedure.  POST-OPERATIVE DIAGNOSIS:    Relatively stable native and graft findings with likely final occlusion of the SVG-PDA from the original surgery. There is mild progression of disease in the sequential limb from SVG-D2-D3, but not amenable to PCI.  Severe native CAD with near occlusion of the left main with only a small circumflex residual and an LAD occlusion after septal perforators.  The RCA is also known to be occluded.  Severely elevated LVEDP of 30 mmHg with systemic hypertension.  Potential etiology for positive  troponin could simply be elevated LVEDP in the setting of microvascular and some otherwise small vessel disease.  PLAN OF CARE:  Return to TCU for post radial cath care. Restart heparin to complete 48 hour course.  Will dose with when necessary IV hydralazine to get blood pressures down to the normal range. We'll also initiate IV Lasix for least 3 doses of 40 mg daily for afterload reduction.  Continue other aggressive cardiovascular risk factor modification.  Anticipate discharge in probably 2-3 days.   Leonie Man, M.D., M.S. Interventional Cardiologist   Pager # [phone]

## 2014-08-30 NOTE — Progress Notes (Signed)
ANTIBIOTIC CONSULT NOTE - INITIAL  Pharmacy Consult for Ceftriaxone Indication: UTI  No Known Allergies  Patient Measurements: Height: 5\' 6"  (167.6 cm) Weight: 171 lb 1.2 oz (77.6 kg) IBW/kg (Calculated) : 63.8  Vital Signs: Temp: 97.9 F (36.6 C) (11/13 0540) Temp Source: Oral (11/13 0540) BP: 161/58 mmHg (11/13 0636) Pulse Rate: 72 (11/13 0540) Intake/Output from previous day: 11/12 0701 - 11/13 0700 In: 100 [I.V.:100] Out: 1500 [Urine:1500] Intake/Output from this shift:    Labs:  Recent Labs  08/28/14 0414 08/29/14 1844 08/30/14 0330  WBC  --  10.3 9.3  HGB  --  10.1* 8.8*  PLT  --  385 349  CREATININE 1.58* 1.74* 1.51*   Estimated Creatinine Clearance: 43.3 mL/min (by C-G formula based on Cr of 1.51). No results for input(s): VANCOTROUGH, VANCOPEAK, VANCORANDOM, GENTTROUGH, GENTPEAK, GENTRANDOM, TOBRATROUGH, TOBRAPEAK, TOBRARND, AMIKACINPEAK, AMIKACINTROU, AMIKACIN in the last 72 hours.   Microbiology: Recent Results (from the past 720 hour(s))  Urine culture     Status: None   Collection Time: 08/26/14  1:52 AM  Result Value Ref Range Status   Specimen Description URINE, CATHETERIZED  Final   Special Requests ADDED 0316  Final   Culture  Setup Time   Final    08/26/2014 08:20 Performed at [facility]   Final    30,000 COLONIES/ML Performed at [facility]    Culture   Final    Multiple bacterial morphotypes present, none predominant. Suggest appropriate recollection if clinically indicated. Performed at [facility]    Report Status 08/27/2014 FINAL  Final    Medical History: Past Medical History  Diagnosis Date  . Carotid artery occlusion     Bilateral 50-69% stenoses of the internal carotid arteries. He needs carotid Dopplers every 6 months.  . Hyperlipidemia   . Atherosclerosis of native arteries of the extremities with intermittent claudication Left external iliac artery stent in 2001    Bilateral  SFA occlusions documented in 2001.  Marland Kitchen Hypertension   . GERD (gastroesophageal reflux disease)   . Esophageal stricture 09/24/2013    History of dilatation. Long-standing history of esophageal reflux   . 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 1999 Repeat CABG 2015 with SVG to D1, SVG to PDA, and SVG to OM.   Marland Kitchen Chronic diastolic heart failure 04/25/8920  . CAD (coronary artery disease), native coronary artery 09/24/2013    Native circumflex Taxus stent 2007, angioplasty of the SVG to native right coronary 2006, DES stent to the SVG of RCA 2007 and resplinted 2009  CABG in 1999. Stent in SVG to RCA in 1941 complicated by no reflow   . CHF (congestive heart failure)   . MI (myocardial infarction) 1999-02/2014    "I've had 6"  . Anginal pain   . Chronic bronchitis     "more times than I can count; ever since I was a kid"  . OSA (obstructive sleep apnea)     "don't wear mask" (08/26/2014)  . Type II diabetes mellitus   . Anemia   . History of hiatal hernia   . Daily headache     "lately" (08/26/2014)  . Arthritis     "qwhere"  . Chronic lower back pain   . CKD (chronic kidney disease) stage 3, GFR 30-59 ml/min   . Skin cancer   .  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 Polymyalgia     Assessment: Ceftriaxone for UTI. WBC WNL, renal function ok, other labs as above.   Plan:  -Ceftriaxone 1g IV q24h -Trend WBC, temp, cultures  Narda Bonds 08/30/2014,7:36 AM

## 2014-08-30 NOTE — Progress Notes (Signed)
Pt arrived to the unit from ED off insulin drip. MD called to verify CBG orders. MD order to give Lantus now at 0630 am and in two hours recheck CBG. 6am CBG 121. During report from ED. ED nurse stated that she informed Cardiology MD of increased troponin 2.15 and no heparin orders were placed. Will pass information to next shift RN.

## 2014-08-30 NOTE — Interval H&P Note (Signed)
History and Physical Interval Note:  08/30/2014 3:28 PM  Imanol C Warshawsky  has presented today for surgery, with the diagnosis of N-Stemi  The various methods of treatment have been discussed with the patient and family. After consideration of risks, benefits and other options for treatment, the patient has consented to  Procedure(s): LEFT HEART CATHETERIZATION WITH CORONARY/GRAFT ANGIOGRAM (N/A) as a surgical intervention .  The patient's history has been reviewed, patient examined, no change in status, stable for surgery.  I have reviewed the patient's chart and labs.  Questions were answered to the patient's satisfaction.    Cath Lab Visit (complete for each Cath Lab visit)  Clinical Evaluation Leading to the Procedure:   ACS: Yes.    Non-ACS:    Anginal Classification: CCS IV  Anti-ischemic medical therapy: Maximal Therapy (2 or more classes of medications)  Non-Invasive Test Results: No non-invasive testing performed  Prior CABG: Previous CABG         HARDING,DAVID W

## 2014-08-30 NOTE — Progress Notes (Signed)
SUBJECTIVE: Pt seen and examined. Pt in NSR on telemetry. He reports he no longer has chest pain and denies dyspnea, palpitations, or LE edema. His Hg is 8.8 from 10.1 yesterday but denies active bleeding. He wants to have cardiac catherization and no longer wants to be on prednisone because all his problems began after starting it. His troponin this morning is 2.18.    OBJECTIVE:   Vitals:   Filed Vitals:   08/30/14 0540 08/30/14 0636 08/30/14 0741 08/30/14 1141  BP:  161/58 168/58 166/54  Pulse: 72  74 65  Temp: 97.9 F (36.6 C)  98.4 F (36.9 C) 97.9 F (36.6 C)  TempSrc: Oral  Oral Oral  Resp:   20 21  Height: 5\' 6"  (1.676 m)     Weight: 171 lb 1.2 oz (77.6 kg)     SpO2: 94%  95% 94%   I&O's:   Intake/Output Summary (Last 24 hours) at 08/30/14 1248 Last data filed at 08/30/14 0919  Gross per 24 hour  Intake    200 ml  Output   1500 ml  Net  -1300 ml   TELEMETRY: Reviewed telemetry pt in NSR:     PHYSICAL EXAM General: Well developed, well nourished, in no acute distress Head:   Normal cephalic and atramatic  Lungs:  Clear bilaterally to auscultation. Heart:  HRRR S1 S2  No JVD. Systolic ejection murmur heard.    Abdomen: abdomen soft and non-tender Msk:  Back normal,  Normal strength and tone for age. Extremities:  No edema.   Neuro: Alert and oriented. Psych:  Normal affect, responds appropriately Skin: No rash   LABS: Basic Metabolic Panel:  Recent Labs  08/29/14 1844 08/30/14 0330  NA 135* 143  K 4.9 3.7  CL 93* 101  CO2 22 28  GLUCOSE 493* 121*  BUN 45* 41*  CREATININE 1.74* 1.51*  CALCIUM 9.2 9.1   Liver Function Tests:  Recent Labs  08/30/14 0330  AST 23  ALT 37  ALKPHOS 69  BILITOT <0.2*  PROT 6.2  ALBUMIN 2.8*   No results for input(s): LIPASE, AMYLASE in the last 72 hours. CBC:  Recent Labs  08/29/14 1844 08/30/14 0330  WBC 10.3 9.3  HGB 10.1* 8.8*  HCT 31.2* 27.2*  MCV 73.8* 73.5*  PLT 385 349   Cardiac  Enzymes:  Recent Labs  08/29/14 2129 08/30/14 0330 08/30/14 0942  TROPONINI 0.47* 2.15* 2.18*   BNP: Invalid input(s): POCBNP D-Dimer: No results for input(s): DDIMER in the last 72 hours. Hemoglobin A1C: No results for input(s): HGBA1C in the last 72 hours. Fasting Lipid Panel: No results for input(s): CHOL, HDL, LDLCALC, TRIG, CHOLHDL, LDLDIRECT in the last 72 hours. Thyroid Function Tests:  Recent Labs  08/30/14 0321  TSH 2.860   Anemia Panel: No results for input(s): VITAMINB12, FOLATE, FERRITIN, TIBC, IRON, RETICCTPCT in the last 72 hours. Coag Panel:   Lab Results  Component Value Date   INR 1.1* 07/17/2014   INR 1.47 03/01/2014   INR 1.07 02/26/2014    RADIOLOGY: Dg Chest 2 View  08/26/2014   CLINICAL DATA:  Dyspnea for 4 hr, worse with lying down.  EXAM: CHEST  2 VIEW  COMPARISON:  PA and lateral chest 04/03/2014. Single view of the chest 03/02/2014. CT chest 03/20/2013.  FINDINGS: The patient is status post CABG. Heart size is normal. No pneumothorax or pleural effusion is identified. Kerley B-lines in the lower chest are noted. No focal airspace disease is seen.  IMPRESSION: Kerley B-lines in the lower chest most in keeping with mild interstitial edema.   Electronically Signed   By: Inge Rise M.D.   On: 08/26/2014 01:39   Dg Chest Port 1 View  08/29/2014   CLINICAL DATA:  Left-sided chest pain today.  EXAM: PORTABLE CHEST - 1 VIEW  COMPARISON:  08/26/2014  FINDINGS: Lungs are adequately inflated without consolidation or effusion. Borderline cardiomegaly unchanged. Remainder of the exam is unchanged.  IMPRESSION: No acute cardiopulmonary disease.  Borderline cardiomegaly.   Electronically Signed   By: Marin Olp M.D.   On: 08/29/2014 19:18    Principal Problem:   NSTEMI (non-ST elevated myocardial infarction) Active Problems:   Hyperlipidemia   CAD (coronary artery disease), native coronary artery   COPD (chronic obstructive pulmonary disease)   Type  2 diabetes mellitus with vascular disease   CKD (chronic kidney disease) stage 3, GFR 30-59 ml/min   Chronic combined systolic and diastolic congestive heart failure   Diabetes type 2, uncontrolled   Polymyalgia rheumatica   Anemia    ASSESSMENT: 73 yo man with pmh of CAD s/p CABG x 2 (1999 & May 2015) with last catherization in October, HTN, HL, combined chronic CHF (EF 45-50% with grade 2 diastolic), COPD, CKD Stage 3, OSA, PMR newly started on prednisone, recently discharged for CHF exacerbation on 11/11 who presents with CP found to have NSTEMI.   PLAN:    NSTEMI in setting of CAD s/p CABG x2  (1999 & May 2015)- Currently denies CP. Troponin peak of 2.18., continue to cycle. Last catherization was last month. Concern for graft stenosis, will start IV heparin. Pt scheduled for catherization pending improvement in Hg. Continue home aspirin 325 mg daily, lipitor 20 mg daily, and home anti-hypertensives. Trigger most likely recent CHF exacerbation in setting of newly started prednisone therapy. May potentially need dual AP therapy.  Chronic Combined CHF - Last echo on 08/26/14 with EF 45-50% and grade 2 diastolic dysfunction. Hold home lasix 40 mg daily. On IV fluids pre-catherization.   Hypertension - Pt on home amlodipine 10 mg, hydralazine 50 mg TID, irbesartan 150 mg daily, lopressor 50 mg BID, and imdur 120 mg daily. Hold home lasix 40 mg daily. Acute on Chronic Normocytic Anemia - Hg drop from 10.1 to 8.8 most likely hemodilution for IV fluids. FOBT and anemia panel pending. Continue to monitor.  Hyperlipidemia - Continue home lipitor 20 mg daily. CKD Stage 3 - Cr 1.51 near baseline 1.3-1.5. Continue to monitor.  Polymyalgia Rheumatica - Taper of prednisone 20 mg daily per primary team. Pt reports he does not want to be on corticosteroid therapy in setting of recent AE.  Type II DM - A1c pending. Currently on Lantus 10 U daily and sSensitive SSI. Management per primary team. Hold home  metformin and glipizide.   Juluis Mire, MD  Linus Mako [phone] 08/30/2014  12:48 PM  Attending note to follow  ATTENDING ATTESTATION:  I have seen and examined the patient along with Dr. Naaman Plummer on AM rounds.  We reviewed the chart, notes and new data.  I agree with her findings, examination & recommendations as noted above.  Brief Description: 73 y/o man with long-standing CAD - with recent re-do CABG in May 2015.  Cath 07/23/14 - patent grafts, recent CHF admission (likely related to steroid related volume overload) --> now admitted with SSx similar to prior 6 MIs -- now ruled in for #7 NSTEMI.   Key new complaints: No more CP, no  melena  Key examination changes: stable exam - as above  Key new findings / data: Hgb down from admission - rechecked, stable.  PLAN:  IV Heparin  Home CAD meds - ASA, statin, BB, ARB & CCB.  We discussed Early Invasive (Cath-PCI) Management vs. Early Conservative (IV Heparin, Nitrates & possible stress test) -- he would prefer Cath-PCI.  Plan LHC-Cor/Graft Angio +/- PCI today pending stable Hgb level => if SVG disease, will consider BMS given unexplained drop in Hgb & his history of "bleeding issues" on Plavix in the past.  The procedure with Risks/Benefits/Alternatives and Indications was reviewed with the patient and family.  All questions were answered.    Risks / Complications include, but not limited to: Death, MI, CVA/TIA, VF/VT (with defibrillation), Bradycardia (need for temporary pacer placement), contrast induced nephropathy - slightly increased with him given CKD 3, bleeding / bruising / hematoma / pseudoaneurysm, vascular or coronary injury (with possible emergent CT or Vascular Surgery), adverse medication reactions, infection.  Additional risks involving the use of radiation with the possibility of radiation burns and cancer were explained in detail.  Increased risk of Graft Angio & PCI was discussed.  The patient & family voiced  understanding and agree to proceed.     Leonie Man, M.D., M.S.  [address] [place], [place]  [address]  [phone]  08/30/2014 2:19 PM

## 2014-08-30 NOTE — Progress Notes (Signed)
ANTICOAGULATION CONSULT NOTE - Initial Consult  Pharmacy Consult for Heparin Indication: chest pain/ACS  No Known Allergies  Patient Measurements: Height: 5\' 6"  (167.6 cm) Weight: 171 lb 1.2 oz (77.6 kg) IBW/kg (Calculated) : 63.8  Vital Signs: Temp: 98.4 F (36.9 C) (11/13 0741) Temp Source: Oral (11/13 0741) BP: 168/58 mmHg (11/13 0741) Pulse Rate: 74 (11/13 0741)  Labs:  Recent Labs  08/28/14 0414 08/29/14 1844 08/29/14 2129 08/30/14 0330  HGB  --  10.1*  --  8.8*  HCT  --  31.2*  --  27.2*  PLT  --  385  --  349  CREATININE 1.58* 1.74*  --  1.51*  TROPONINI  --   --  0.47* 2.15*    Estimated Creatinine Clearance: 43.3 mL/min (by C-G formula based on Cr of 1.51).   Medical History: Past Medical History  Diagnosis Date  . Carotid artery occlusion     Bilateral 50-69% stenoses of the internal carotid arteries. He needs carotid Dopplers every 6 months.  . Hyperlipidemia   . Atherosclerosis of native arteries of the extremities with intermittent claudication Left external iliac artery stent in 2001    Bilateral SFA occlusions documented in 2001.  Marland Kitchen Hypertension   . GERD (gastroesophageal reflux disease)   . Esophageal stricture 09/24/2013    History of dilatation. Long-standing history of esophageal reflux   . 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 1999 Repeat CABG 2015 with SVG to D1, SVG to PDA, and SVG to OM.   Marland Kitchen Chronic diastolic heart failure 4/0/9811  . CAD (coronary artery disease), native coronary artery 09/24/2013    Native circumflex Taxus stent 2007, angioplasty of the SVG to native right coronary 2006, DES stent to the SVG of RCA 2007 and resplinted 2009  CABG in 1999. Stent in SVG to RCA in 9147 complicated by no reflow   . CHF (congestive heart failure)   . MI (myocardial infarction) 1999-02/2014    "I've had 6"  . Anginal pain   . Chronic  bronchitis     "more times than I can count; ever since I was a kid"  . OSA (obstructive sleep apnea)     "don't wear mask" (08/26/2014)  . Type II diabetes mellitus   . Anemia   . History of hiatal hernia   . Daily headache     "lately" (08/26/2014)  . Arthritis     "qwhere"  . Chronic lower back pain   . CKD (chronic kidney disease) stage 3, GFR 30-59 ml/min   . Skin cancer   . 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 Polymyalgia     Medications:  Scheduled:  . amLODipine  10 mg Oral Daily  . aspirin EC  325 mg Oral Daily  . atorvastatin  20 mg Oral q1800  . cefTRIAXone (ROCEPHIN)  IV  1 g Intravenous Q24H  . cholecalciferol  1,000 Units Oral Daily  . folic acid  1 mg Oral Daily  . hydrALAZINE  50 mg Oral TID  . insulin aspart  0-5 Units Subcutaneous QHS  . insulin aspart  0-9 Units Subcutaneous TID WC  . insulin glargine  10 Units Subcutaneous QHS  . irbesartan  150 mg Oral q morning - 10a  . isosorbide mononitrate  120 mg Oral Daily  .  metoprolol  50 mg Oral BID  . multivitamin with minerals  1 tablet Oral Daily  . pantoprazole  40 mg Oral Daily  . predniSONE  20 mg Oral Q breakfast  . sodium chloride  3 mL Intravenous Q12H  . thiamine  100 mg Oral Daily   Infusions:  . sodium chloride Stopped (08/30/14 0322)  . dextrose 5 % and 0.45% NaCl 50 mL/hr at 08/30/14 0321  . heparin      Assessment: 73 yo M admitted on 08/29/2014 with CP that started the morning of admit. Troponins are trending up so will start heparin gtt. H/H has slightly trended down, plt wnl and stable with no reported s/s bleeding.   Goal of Therapy:  Heparin level 0.3-0.7 units/ml Monitor platelets by anticoagulation protocol: Yes   Plan:  - Start heparin at 900 u/hr without bolus as recently received SQ - HL in 8 hours - Daily HL/CBC/s/s bleeding  Harolyn Rutherford, PharmD Clinical Pharmacist - Resident Pager:  [phone] Pharmacy: [phone] 08/30/2014 8:55 AM

## 2014-08-30 NOTE — Progress Notes (Signed)
Nutrition Brief Note  Patient identified on the Malnutrition Screening Tool (MST) Report  Wt Readings from Last 15 Encounters:  08/30/14 171 lb 1.2 oz (77.6 kg)  08/28/14 162 lb 0.6 oz (73.5 kg)  07/23/14 165 lb (74.844 kg)  06/27/14 170 lb (77.111 kg)  04/22/14 162 lb (73.483 kg)  04/11/14 162 lb 11.2 oz (73.8 kg)  04/03/14 160 lb (72.576 kg)  03/18/14 164 lb (74.39 kg)  03/07/14 171 lb (77.565 kg)  02/20/14 171 lb (77.565 kg)  01/16/14 169 lb 1.9 oz (76.712 kg)  10/05/13 163 lb (73.936 kg)  09/27/13 156 lb 9.6 oz (71.033 kg)  09/12/13 168 lb 12.8 oz (76.567 kg)  08/27/13 169 lb (76.658 kg)   [name] is a 74 y.o. male presents with chest pain. He had been discharged yesterday after an admission for congestive heart failure. He was treated with diuretics and responded well so was sent home. He states that today he started having chest pain. Patient states that the pain was located in the center chest and was sharp type of pain. He states that the pain was similar to prior episodes when he has had an MI. 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 During his last stay he was noted to have an EF of 45-50%. Patient states that he was also recently started on prednisone for PMR and has been on 20mg  daily. His sugars were noted to be 498 here in the ED. Patient is being admitted for better control of his sugars. In addition he has a UTI.  Pt reports very good appetite PTA. He reports he could "eat that paper in your hand" due to being hungry. He reports his appetite and weight have increased due to prednisone. He has no nutrition concerns at this time. He reports UBW around 165#.   Body mass index is 27.63 kg/(m^2). Patient meets criteria for overweight  based on current BMI.   Current diet order is Heart Healthy/Carb Modified, patient is consuming approximately n/a% of meals at this time. Labs and medications reviewed.   No nutrition interventions  warranted at this time. If nutrition issues arise, please consult RD.   Candice Lunney A. Jimmye Norman, RD, LDN Pager: [phone] After hours Pager: [phone]

## 2014-08-30 NOTE — Progress Notes (Signed)
BIPAP not ordered per RN and MD. BIPAP order showing in RT worklist.

## 2014-08-30 NOTE — Progress Notes (Signed)
PROGRESS NOTE  [name] FMB:[id number] DOB: 1941-04-09 DOA: 08/29/2014 PCP: Shirline Frees, MD  Assessment/Plan: Diabetes Mellitus type 2 uncontrolled -patient has been on steroids which is likely driving his sugar up -off insulin gtt -start on Carb modified diet -lantus/SSI -HgbA1C pending  CAD -troponin increasing -cardiology to see patient  COPD -nebulizer prn for wheeze and SOB -oxygen as needed  CHF -current CXR is clear however he does have some crackles on exam -will continue with lasix as per cardiology orders  Hyperlipidemia -continue home medications  UTI -has longstanding history of self cath -will start on rocephin now -await cultures results  PMR -recently started on steroids 1 week ago- 20 mg -wean down to 15 mg for now  Anemia -Fe studies -repeat cbc -heme test stools  Code Status: full Family Communication: patient/wife Disposition Plan:    Consultants:  cardiology  Procedures:      HPI/Subjective: Worried about steroids   Objective: Filed Vitals:   08/30/14 0741  BP: 168/58  Pulse: 74  Temp: 98.4 F (36.9 C)  Resp: 20    Intake/Output Summary (Last 24 hours) at 08/30/14 0851 Last data filed at 08/30/14 0700  Gross per 24 hour  Intake    100 ml  Output   1500 ml  Net  -1400 ml   Filed Weights   08/29/14 1836 08/30/14 0540  Weight: 73.936 kg (163 lb) 77.6 kg (171 lb 1.2 oz)    Exam:   General:  A+Ox3, NAD  Cardiovascular: rrr  Respiratory: clear  Abdomen: +BS, soft  Musculoskeletal: +edema   Data Reviewed: Basic Metabolic Panel:  Recent Labs Lab 08/26/14 0650 08/27/14 0700 08/28/14 0414 08/29/14 1844 08/30/14 0330  NA 145 143 142 135* 143  K 4.1 4.3 4.1 4.9 3.7  CL 105 102 102 93* 101  CO2 26 29 29 22 28   GLUCOSE 54* 163* 148* 493* 121*  BUN 35* 32* 42* 45* 41*  CREATININE 1.38* 1.33 1.58* 1.74* 1.51*  CALCIUM 9.6 9.2 9.1 9.2 9.1   Liver Function Tests:  Recent  Labs Lab 08/26/14 0650 08/30/14 0330  AST 23 23  ALT 48 37  ALKPHOS 75 69  BILITOT <0.2* <0.2*  PROT 6.9 6.2  ALBUMIN 2.8* 2.8*   No results for input(s): LIPASE, AMYLASE in the last 168 hours. No results for input(s): AMMONIA in the last 168 hours. CBC:  Recent Labs Lab 08/26/14 0056 08/26/14 0650 08/29/14 1844 08/30/14 0330  WBC 10.7* 11.5* 10.3 9.3  HGB 10.1* 10.1* 10.1* 8.8*  HCT 31.8* 31.8* 31.2* 27.2*  MCV 75.5* 75.2* 73.8* 73.5*  PLT 438* 410* 385 349   Cardiac Enzymes:  Recent Labs Lab 08/26/14 0332 08/26/14 0925 08/29/14 2129 08/30/14 0330  TROPONINI <0.30 <0.30 0.47* 2.15*   BNP (last 3 results)  Recent Labs  02/25/14 2302 08/26/14 0056 08/27/14 0700  PROBNP 182.4* 5264.0* 2854.0*   CBG:  Recent Labs Lab 08/30/14 0216 08/30/14 0316 08/30/14 0423 08/30/14 0608 08/30/14 0744  GLUCAP 191* 126* 100* 121* 131*    Recent Results (from the past 240 hour(s))  Urine culture     Status: None   Collection Time: 08/26/14  1:52 AM  Result Value Ref Range Status   Specimen Description URINE, CATHETERIZED  Final   Special Requests ADDED 0316  Final   Culture  Setup Time   Final    08/26/2014 08:20 Performed at [place]   Final    30,000 COLONIES/ML Performed at  [facility]   Final    Multiple bacterial morphotypes present, none predominant. Suggest appropriate recollection if clinically indicated. Performed at [facility]    Report Status 08/27/2014 FINAL  Final  MRSA PCR Screening     Status: None   Collection Time: 08/30/14  5:36 AM  Result Value Ref Range Status   MRSA by PCR NEGATIVE NEGATIVE Final    Comment:        The GeneXpert MRSA Assay (FDA approved for NASAL specimens only), is one component of a comprehensive MRSA colonization surveillance program. It is not intended to diagnose MRSA infection nor to guide or monitor treatment for MRSA infections.       Studies: Dg Chest Port 1 View  08/29/2014   CLINICAL DATA:  Left-sided chest pain today.  EXAM: PORTABLE CHEST - 1 VIEW  COMPARISON:  08/26/2014  FINDINGS: Lungs are adequately inflated without consolidation or effusion. Borderline cardiomegaly unchanged. Remainder of the exam is unchanged.  IMPRESSION: No acute cardiopulmonary disease.  Borderline cardiomegaly.   Electronically Signed   By: Marin Olp M.D.   On: 08/29/2014 19:18    Scheduled Meds: . amLODipine  10 mg Oral Daily  . aspirin EC  325 mg Oral Daily  . atorvastatin  20 mg Oral q1800  . cefTRIAXone (ROCEPHIN)  IV  1 g Intravenous Q24H  . cholecalciferol  1,000 Units Oral Daily  . folic acid  1 mg Oral Daily  . hydrALAZINE  50 mg Oral TID  . insulin aspart  0-5 Units Subcutaneous QHS  . insulin aspart  0-9 Units Subcutaneous TID WC  . insulin glargine  10 Units Subcutaneous QHS  . irbesartan  150 mg Oral q morning - 10a  . isosorbide mononitrate  120 mg Oral Daily  . metoprolol  50 mg Oral BID  . multivitamin with minerals  1 tablet Oral Daily  . pantoprazole  40 mg Oral Daily  . predniSONE  20 mg Oral Q breakfast  . sodium chloride  3 mL Intravenous Q12H  . thiamine  100 mg Oral Daily   Continuous Infusions: . sodium chloride Stopped (08/30/14 0322)  . dextrose 5 % and 0.45% NaCl 50 mL/hr at 08/30/14 0321  . heparin     Antibiotics Given (last 72 hours)    None      Principal Problem:   Diabetes type 2, uncontrolled Active Problems:   Hyperlipidemia   CAD (coronary artery disease), native coronary artery   COPD (chronic obstructive pulmonary disease)   Type 2 diabetes mellitus with vascular disease   Congestive heart failure    Time spent: 35 min    Mikki Ziff  [facility] Pager [phone]. If 7PM-7AM, please contact night-coverage at [email or web address], password [facility] 08/30/2014, 8:51 AM  LOS: 1 day

## 2014-08-31 DIAGNOSIS — I25110 Atherosclerotic heart disease of native coronary artery with unstable angina pectoris: Secondary | ICD-10-CM

## 2014-08-31 DIAGNOSIS — D508 Other iron deficiency anemias: Secondary | ICD-10-CM

## 2014-08-31 DIAGNOSIS — R079 Chest pain, unspecified: Secondary | ICD-10-CM

## 2014-08-31 LAB — GLUCOSE, CAPILLARY
GLUCOSE-CAPILLARY: 178 mg/dL — AB (ref 70–99)
GLUCOSE-CAPILLARY: 259 mg/dL — AB (ref 70–99)
GLUCOSE-CAPILLARY: 366 mg/dL — AB (ref 70–99)
Glucose-Capillary: 132 mg/dL — ABNORMAL HIGH (ref 70–99)
Glucose-Capillary: 230 mg/dL — ABNORMAL HIGH (ref 70–99)
Glucose-Capillary: 349 mg/dL — ABNORMAL HIGH (ref 70–99)

## 2014-08-31 LAB — CBC
HCT: 28.7 % — ABNORMAL LOW (ref 39.0–52.0)
HEMOGLOBIN: 9.1 g/dL — AB (ref 13.0–17.0)
MCH: 24.3 pg — ABNORMAL LOW (ref 26.0–34.0)
MCHC: 31.7 g/dL (ref 30.0–36.0)
MCV: 76.5 fL — ABNORMAL LOW (ref 78.0–100.0)
PLATELETS: 320 10*3/uL (ref 150–400)
RBC: 3.75 MIL/uL — ABNORMAL LOW (ref 4.22–5.81)
RDW: 16.6 % — ABNORMAL HIGH (ref 11.5–15.5)
WBC: 7.8 10*3/uL (ref 4.0–10.5)

## 2014-08-31 LAB — HEPARIN LEVEL (UNFRACTIONATED)
HEPARIN UNFRACTIONATED: 0.27 [IU]/mL — AB (ref 0.30–0.70)
Heparin Unfractionated: <0.1 IU/mL — ABNORMAL LOW (ref 0.30–0.70)

## 2014-08-31 LAB — BASIC METABOLIC PANEL
ANION GAP: 13 (ref 5–15)
BUN: 38 mg/dL — ABNORMAL HIGH (ref 6–23)
CHLORIDE: 99 meq/L (ref 96–112)
CO2: 25 meq/L (ref 19–32)
Calcium: 8.7 mg/dL (ref 8.4–10.5)
Creatinine, Ser: 1.6 mg/dL — ABNORMAL HIGH (ref 0.50–1.35)
GFR calc Af Amer: 48 mL/min — ABNORMAL LOW (ref >90)
GFR calc non Af Amer: 41 mL/min — ABNORMAL LOW (ref >90)
GLUCOSE: 224 mg/dL — AB (ref 70–99)
Potassium: 4.9 mEq/L (ref 3.7–5.3)
SODIUM: 137 meq/L (ref 137–147)

## 2014-08-31 LAB — OCCULT BLOOD X 1 CARD TO LAB, STOOL: Fecal Occult Bld: POSITIVE — AB

## 2014-08-31 MED ORDER — INSULIN GLARGINE 100 UNIT/ML ~~LOC~~ SOLN
15.0000 [IU] | Freq: Every day | SUBCUTANEOUS | Status: DC
  Administered 2014-08-31: 15 [IU] via SUBCUTANEOUS
  Filled 2014-08-31 (×2): qty 0.15

## 2014-08-31 MED ORDER — HEPARIN (PORCINE) IN NACL 100-0.45 UNIT/ML-% IJ SOLN
1050.0000 [IU]/h | INTRAMUSCULAR | Status: DC
  Administered 2014-08-31: 1050 [IU]/h via INTRAVENOUS

## 2014-08-31 MED ORDER — FUROSEMIDE 40 MG PO TABS
40.0000 mg | ORAL_TABLET | Freq: Two times a day (BID) | ORAL | Status: DC
  Administered 2014-08-31 – 2014-09-03 (×6): 40 mg via ORAL
  Filled 2014-08-31 (×7): qty 1

## 2014-08-31 NOTE — Progress Notes (Signed)
Patient called this RN to room and complained of severe chest pain and SOB.  He rated his chest pain a 10 on a scale of 0-10.  Nitroglycerin was given x2, plus 2 mg morphine IV.  EKG was done and MD was notified.  No new orders given at this time.  Patient rated his pain at a 0 after morphine was given.  Will continue to monitor patient.

## 2014-08-31 NOTE — Progress Notes (Signed)
ANTICOAGULATION CONSULT NOTE - Follow Up Consult  Pharmacy Consult for Heparin Indication: chest pain/ACS s/p cath   No Known Allergies  Patient Measurements: Height: 5\' 6"  (167.6 cm) Weight: 171 lb 1.2 oz (77.6 kg) IBW/kg (Calculated) : 63.8  Vital Signs: Temp: 97.8 F (36.6 C) (11/14 0800) Temp Source: Oral (11/14 0800) BP: 157/57 mmHg (11/14 0800) Pulse Rate: 75 (11/14 0800)  Labs:  Recent Labs  08/29/14 1844  08/30/14 0330 08/30/14 0942 08/30/14 1237 08/30/14 1715 08/31/14 0142 08/31/14 0820  HGB 10.1*  --  8.8*  --  8.9*  --  9.1*  --   HCT 31.2*  --  27.2*  --  27.8*  --  28.7*  --   PLT 385  --  349  --  358  --  320  --   HEPARINUNFRC  --   --   --   --   --   --   --  0.27*  CREATININE 1.74*  --  1.51*  --   --   --  1.60*  --   TROPONINI  --   < > 2.15* 2.18*  --  1.68*  --   --   < > = values in this interval not displayed.  Estimated Creatinine Clearance: 40.9 mL/min (by C-G formula based on Cr of 1.6).   Medical History: Past Medical History  Diagnosis Date  . Carotid artery occlusion     Bilateral 50-69% stenoses of the internal carotid arteries. He needs carotid Dopplers every 6 months.  . Hyperlipidemia   . Atherosclerosis of native arteries of the extremities with intermittent claudication Left external iliac artery stent in 2001    Bilateral SFA occlusions documented in 2001.  Marland Kitchen Hypertension   . GERD (gastroesophageal reflux disease)   . Esophageal stricture 09/24/2013    History of dilatation. Long-standing history of esophageal reflux   . 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 1999 Repeat CABG 2015 with SVG to D1, SVG to PDA, and SVG to OM.   Marland Kitchen Chronic diastolic heart failure 01/16/9378  . CAD (coronary artery disease), native coronary artery 09/24/2013    Native circumflex Taxus stent 2007, angioplasty of the SVG to native right coronary  2006, DES stent to the SVG of RCA 2007 and resplinted 2009  CABG in 1999. Stent in SVG to RCA in 0240 complicated by no reflow   . CHF (congestive heart failure)   . MI (myocardial infarction) 1999-02/2014    "I've had 6"  . Anginal pain   . Chronic bronchitis     "more times than I can count; ever since I was a kid"  . OSA (obstructive sleep apnea)     "don't wear mask" (08/26/2014)  . Type II diabetes mellitus   . Anemia   . History of hiatal hernia   . Daily headache     "lately" (08/26/2014)  . Arthritis     "qwhere"  . Chronic lower back pain   . CKD (chronic kidney disease) stage 3, GFR 30-59 ml/min   . Skin cancer   . 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 Polymyalgia     Medications:  Scheduled:  . amLODipine  10 mg Oral Daily  . aspirin EC  325 mg Oral Daily  . atorvastatin  20 mg Oral q1800  . cefTRIAXone (ROCEPHIN)  IV  1 g Intravenous Q24H  . cholecalciferol  1,000 Units Oral Daily  . folic acid  1 mg Oral Daily  . furosemide  40 mg Intravenous BID  . hydrALAZINE  50 mg Oral TID  . insulin aspart  0-9 Units Subcutaneous 6 times per day  . insulin glargine  15 Units Subcutaneous QHS  . irbesartan  150 mg Oral q morning - 10a  . isosorbide mononitrate  120 mg Oral Daily  . metoprolol  50 mg Oral BID  . multivitamin with minerals  1 tablet Oral Daily  . pantoprazole  40 mg Oral Daily  . predniSONE  15 mg Oral Q breakfast  . sodium chloride  3 mL Intravenous Q12H  . thiamine  100 mg Oral Daily   Infusions:  . sodium chloride 50 mL/hr at 08/31/14 2202  . heparin      Assessment: 73 yo M admitted on 08/29/2014 with CP that started the morning of admit. Pt is s/p LHC with findings of severe native CAD and relatively stable graft findings. Pharmacy consulted to resume heparin gtt x 48 hours. Initial HL is slightly SUBtherapeutic at 0.27 with no interruptions in gtt per nurse. H/H has improved  slightly but remains low with hgb 9.1, plt wnl and stable with no reported s/s bleeding.   Goal of Therapy:  Heparin level 0.3-0.7 units/ml Monitor platelets by anticoagulation protocol: Yes   Plan:  - Increase hep gtt to 1050 u/hr - HL in 8 hours  - Daily HL/CBC/s/s bleeding  Harolyn Rutherford, PharmD Clinical Pharmacist - Resident Pager: [phone] Pharmacy: [phone] 08/31/2014 9:31 AM

## 2014-08-31 NOTE — Progress Notes (Signed)
PROGRESS NOTE  [name] TJQ:[id number] DOB: [birth year] DOA: 08/29/2014 PCP: Shirline Frees, MD   [place] is a 73 y.o. male presents with chest pain. He had been discharged yesterday after an admission for congestive heart failure. He was treated with diuretics and responded well so was sent home. He states that today he started having chest pain. Patient states that the pain was located in the center chest and was sharp type of pain. He states that the pain was similar to prior episodes when he has had an MI. 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 During his last stay he was noted to have an EF of 45-50%. Patient states that he was also recently started on prednisone for PMR and has been on 20mg  daily. His sugars were noted to be 498 in the ER.  Troponins turned positive and patient was cathed 11/14   Assessment/Plan: Diabetes Mellitus type 2 uncontrolled -patient has been on steroids which is likely driving his sugar up -off insulin gtt -started on Carb modified diet -lantus/SSI- suspect patient will go home on lantus while taking steroids- diabetic coordinator consult for education -HgbA1C 7.3  CAD -s/p cath -recent graftsopen  COPD -nebulizer prn for wheeze and SOB -oxygen as needed  CHF -lasix as per cardiology orders  Hyperlipidemia -continue home medications  UTI -has longstanding history of self cath -rocephin  -await cultures results  PMR -recently started on steroids 1 week ago- 20 mg -wean down to 15 mg for now- patient tolerated - has rheum appt 11/23  Anemia -Fe studies -repeat cbc -heme test stools  OSA -has not been wearing his CPAP at night -start now  Code Status: full Family Communication: patient/wife Disposition Plan: tx to tele if ok with cards; home Monday???   Consultants:  cardiology  Procedures:  Cath 11/14    HPI/Subjective: Feeling better today No  SOB   Objective: Filed Vitals:   08/31/14 0800  BP: 157/57  Pulse: 75  Temp: 97.8 F (36.6 C)  Resp: 22    Intake/Output Summary (Last 24 hours) at 08/31/14 0830 Last data filed at 08/31/14 0700  Gross per 24 hour  Intake 716.45 ml  Output   1653 ml  Net -936.55 ml   Filed Weights   08/29/14 1836 08/30/14 0540  Weight: 73.936 kg (163 lb) 77.6 kg (171 lb 1.2 oz)    Exam:   General:  A+Ox3, NAD  Cardiovascular: rrr  Respiratory: clear  Abdomen: +BS, soft  Musculoskeletal: moves all 4 ext  Data Reviewed: Basic Metabolic Panel:  Recent Labs Lab 08/27/14 0700 08/28/14 0414 08/29/14 1844 08/30/14 0330 08/31/14 0142  NA 143 142 135* 143 137  K 4.3 4.1 4.9 3.7 4.9  CL 102 102 93* 101 99  CO2 29 29 22 28 25   GLUCOSE 163* 148* 493* 121* 224*  BUN 32* 42* 45* 41* 38*  CREATININE 1.33 1.58* 1.74* 1.51* 1.60*  CALCIUM 9.2 9.1 9.2 9.1 8.7   Liver Function Tests:  Recent Labs Lab 08/26/14 0650 08/30/14 0330  AST 23 23  ALT 48 37  ALKPHOS 75 69  BILITOT <0.2* <0.2*  PROT 6.9 6.2  ALBUMIN 2.8* 2.8*   No results for input(s): LIPASE, AMYLASE in the last 168 hours. No results for input(s): AMMONIA in the last 168 hours. CBC:  Recent Labs Lab 08/26/14 0650 08/29/14 1844 08/30/14 0330 08/30/14 1237 08/31/14 0142  WBC 11.5* 10.3 9.3 9.1 7.8  HGB 10.1* 10.1* 8.8* 8.9* 9.1*  HCT 31.8* 31.2* 27.2* 27.8* 28.7*  MCV 75.2* 73.8* 73.5* 73.9* 76.5*  PLT 410* 385 349 358 320   Cardiac Enzymes:  Recent Labs Lab 08/26/14 0925 08/29/14 2129 08/30/14 0330 08/30/14 0942 08/30/14 1715  TROPONINI <0.30 0.47* 2.15* 2.18* 1.68*   BNP (last 3 results)  Recent Labs  02/25/14 2302 08/26/14 0056 08/27/14 0700  PROBNP 182.4* 5264.0* 2854.0*   CBG:  Recent Labs Lab 08/30/14 1140 08/30/14 1702 08/30/14 1946 08/30/14 2358 08/31/14 0407  GLUCAP 109* 109* 252* 230* 178*    Recent Results (from the past 240 hour(s))  Urine culture     Status: None    Collection Time: 08/26/14  1:52 AM  Result Value Ref Range Status   Specimen Description URINE, CATHETERIZED  Final   Special Requests ADDED 0316  Final   Culture  Setup Time   Final    08/26/2014 08:20 Performed at [place]   Final    30,000 COLONIES/ML Performed at [facility]    Culture   Final    Multiple bacterial morphotypes present, none predominant. Suggest appropriate recollection if clinically indicated. Performed at [facility]    Report Status 08/27/2014 FINAL  Final  MRSA PCR Screening     Status: None   Collection Time: 08/30/14  5:36 AM  Result Value Ref Range Status   MRSA by PCR NEGATIVE NEGATIVE Final    Comment:        The GeneXpert MRSA Assay (FDA approved for NASAL specimens only), is one component of a comprehensive MRSA colonization surveillance program. It is not intended to diagnose MRSA infection nor to guide or monitor treatment for MRSA infections.      Studies: Dg Chest Port 1 View  08/29/2014   CLINICAL DATA:  Left-sided chest pain today.  EXAM: PORTABLE CHEST - 1 VIEW  COMPARISON:  08/26/2014  FINDINGS: Lungs are adequately inflated without consolidation or effusion. Borderline cardiomegaly unchanged. Remainder of the exam is unchanged.  IMPRESSION: No acute cardiopulmonary disease.  Borderline cardiomegaly.   Electronically Signed   By: Marin Olp M.D.   On: 08/29/2014 19:18    Scheduled Meds: . amLODipine  10 mg Oral Daily  . aspirin EC  325 mg Oral Daily  . atorvastatin  20 mg Oral q1800  . cefTRIAXone (ROCEPHIN)  IV  1 g Intravenous Q24H  . cholecalciferol  1,000 Units Oral Daily  . folic acid  1 mg Oral Daily  . furosemide  40 mg Intravenous BID  . hydrALAZINE  50 mg Oral TID  . insulin aspart  0-9 Units Subcutaneous 6 times per day  . insulin glargine  10 Units Subcutaneous QHS  . irbesartan  150 mg Oral q morning - 10a  . isosorbide mononitrate  120 mg Oral Daily  .  metoprolol  50 mg Oral BID  . multivitamin with minerals  1 tablet Oral Daily  . pantoprazole  40 mg Oral Daily  . predniSONE  15 mg Oral Q breakfast  . sodium chloride  3 mL Intravenous Q12H  . thiamine  100 mg Oral Daily   Continuous Infusions: . sodium chloride 50 mL/hr at 08/31/14 0263  . heparin 900 Units/hr (08/30/14 2357)   Antibiotics Given (last 72 hours)    None      Principal Problem:   NSTEMI (non-ST elevated myocardial infarction) Active Problems:   Hyperlipidemia   CAD (coronary artery disease), native  coronary artery   COPD (chronic obstructive pulmonary disease)   Type 2 diabetes mellitus with vascular disease   CKD (chronic kidney disease) stage 3, GFR 30-59 ml/min   Chronic combined systolic and diastolic congestive heart failure   Diabetes type 2, uncontrolled   Polymyalgia rheumatica   Anemia   Hyperglycemia    Time spent: 25 min    [name], [name]  [facility] Pager [phone]. If 7PM-7AM, please contact night-coverage at [email or web address], password [facility] 08/31/2014, 8:30 AM  LOS: 2 days

## 2014-08-31 NOTE — Progress Notes (Signed)
DAILY PROGRESS NOTE  Subjective:  10/10 pain today, relieved with morphine, now gone. None of the pain has been nitrate responsive. Cath yesterday with occluded graft, no disease amenable to PCI.  Objective:  Temp:  [97.8 F (36.6 C)-98.1 F (36.7 C)] 98.1 F (36.7 C) (11/14 1100) Pulse Rate:  [52-85] 64 (11/14 1100) Resp:  [14-25] 20 (11/14 1100) BP: (127-192)/(18-68) 160/49 mmHg (11/14 1100) SpO2:  [91 %-100 %] 97 % (11/14 1100) Weight change:   Intake/Output from previous day: 11/13 0701 - 11/14 0700 In: 766.5 [I.V.:666.5; IV Piggyback:100] Out: 1653 [Urine:1653]  Intake/Output from this shift: Total I/O In: 120 [P.O.:120] Out: -   Medications: Current Facility-Administered Medications  Medication Dose Route Frequency Provider Last Rate Last Dose  . 0.9 %  sodium chloride infusion   Intravenous Continuous Allyne Gee, MD 50 mL/hr at 08/31/14 [phone]    . acetaminophen (TYLENOL) tablet 650 mg  650 mg Oral Q6H PRN Allyne Gee, MD   650 mg at 08/30/14 1850   Or  . acetaminophen (TYLENOL) suppository 650 mg  650 mg Rectal Q6H PRN Allyne Gee, MD      . albuterol (PROVENTIL) (2.5 MG/3ML) 0.083% nebulizer solution 2.5 mg  2.5 mg Nebulization Q6H PRN Allyne Gee, MD      . amLODipine (NORVASC) tablet 10 mg  10 mg Oral Daily Allyne Gee, MD   10 mg at 08/31/14 0944  . aspirin EC tablet 325 mg  325 mg Oral Daily Allyne Gee, MD   325 mg at 08/31/14 0944  . atorvastatin (LIPITOR) tablet 20 mg  20 mg Oral q1800 Allyne Gee, MD   20 mg at 08/30/14 1657  . cefTRIAXone (ROCEPHIN) 1 g in dextrose 5 % 50 mL IVPB  1 g Intravenous Q24H Virgel Manifold, MD 100 mL/hr at 08/30/14 2110 1 g at 08/30/14 2110  . cholecalciferol (VITAMIN D) tablet 1,000 Units  1,000 Units Oral Daily Allyne Gee, MD   1,000 Units at 08/31/14 0944  . dextrose 50 % solution 25 mL  25 mL Intravenous PRN Allyne Gee, MD      . folic acid (FOLVITE) tablet 1 mg  1 mg Oral Daily Allyne Gee, MD   1 mg  at 08/31/14 6712  . furosemide (LASIX) injection 40 mg  40 mg Intravenous BID Leonie Man, MD   40 mg at 08/31/14 0809  . heparin ADULT infusion 100 units/mL (25000 units/250 mL)  1,050 Units/hr Intravenous Continuous Ruta Hinds von Vaj[place], RPH 10.5 mL/hr at 08/31/14 4580 1,050 Units/hr at 08/31/14 0937  . hydrALAZINE (APRESOLINE) injection 10 mg  10 mg Intravenous Q6H PRN Leonie Man, MD      . hydrALAZINE (APRESOLINE) tablet 50 mg  50 mg Oral TID Allyne Gee, MD   50 mg at 08/31/14 0654  . insulin aspart (novoLOG) injection 0-9 Units  0-9 Units Subcutaneous 6 times per day Geradine Girt, DO   1 Units at 08/31/14 0820  . insulin glargine (LANTUS) injection 15 Units  15 Units Subcutaneous QHS Jessica U Vann, DO      . irbesartan (AVAPRO) tablet 150 mg  150 mg Oral q morning - 10a Allyne Gee, MD   150 mg at 08/31/14 0943  . isosorbide mononitrate (IMDUR) 24 hr tablet 120 mg  120 mg Oral Daily Allyne Gee, MD   120 mg at 08/31/14 0944  . metoprolol tartrate (LOPRESSOR) tablet 50 mg  50 mg Oral BID Allyne Gee, MD   50 mg at 08/31/14 8295  . morphine 2 MG/ML injection 2 mg  2 mg Intravenous Q1H PRN Leonie Man, MD   2 mg at 08/31/14 6213  . multivitamin with minerals tablet 1 tablet  1 tablet Oral Daily Allyne Gee, MD   1 tablet at 08/31/14 [phone]  . nitroGLYCERIN (NITROSTAT) SL tablet 0.4 mg  0.4 mg Sublingual Q5 min PRN Allyne Gee, MD   0.4 mg at 08/31/14 0920  . ondansetron (ZOFRAN) tablet 4 mg  4 mg Oral Q6H PRN Allyne Gee, MD       Or  . ondansetron [facility]) injection 4 mg  4 mg Intravenous Q6H PRN Allyne Gee, MD      . oxyCODONE (Oxy IR/ROXICODONE) immediate release tablet 5 mg  5 mg Oral Q4H PRN Allyne Gee, MD      . pantoprazole (PROTONIX) EC tablet 40 mg  40 mg Oral Daily Allyne Gee, MD   40 mg at 08/31/14 0943  . predniSONE (DELTASONE) tablet 15 mg  15 mg Oral Q breakfast Geradine Girt, DO   15 mg at 08/31/14 0810  . sodium chloride 0.9 % injection 3 mL  3  mL Intravenous Q12H Allyne Gee, MD   3 mL at 08/31/14 1000  . thiamine (VITAMIN B-1) tablet 100 mg  100 mg Oral Daily Allyne Gee, MD   100 mg at 08/31/14 0944  . zolpidem (AMBIEN) tablet 5 mg  5 mg Oral QHS PRN Allyne Gee, MD        Physical Exam: General appearance: alert and no distress Neck: no carotid bruit and no JVD Lungs: clear to auscultation bilaterally Heart: regular rate and rhythm Abdomen: soft, non-tender; bowel sounds normal; no masses,  no organomegaly Extremities: extremities normal, atraumatic, no cyanosis or edema Pulses: 2+ and symmetric Skin: Skin color, texture, turgor normal. No rashes or lesions Neurologic: Grossly normal PSych: Pleasant  Lab Results: Results for orders placed or performed during the hospital encounter of 08/29/14 (from the past 48 hour(s))  Basic metabolic panel     Status: Abnormal   Collection Time: 08/29/14  6:44 PM  Result Value Ref Range   Sodium 135 (L) 137 - 147 mEq/L   Potassium 4.9 3.7 - 5.3 mEq/L   Chloride 93 (L) 96 - 112 mEq/L   CO2 22 19 - 32 mEq/L   Glucose, Bld 493 (H) 70 - 99 mg/dL   BUN 45 (H) 6 - 23 mg/dL   Creatinine, Ser 1.74 (H) 0.50 - 1.35 mg/dL   Calcium 9.2 8.4 - 10.5 mg/dL   GFR calc non Af Amer 37 (L) >90 mL/min   GFR calc Af Amer 43 (L) >90 mL/min    Comment: (NOTE) The eGFR has been calculated using the CKD EPI equation. This calculation has not been validated in all clinical situations. eGFR's persistently <90 mL/min signify possible Chronic Kidney Disease.    Anion gap 20 (H) 5 - 15  CBC     Status: Abnormal   Collection Time: 08/29/14  6:44 PM  Result Value Ref Range   WBC 10.3 4.0 - 10.5 K/uL   RBC 4.23 4.22 - 5.81 MIL/uL   Hemoglobin 10.1 (L) 13.0 - 17.0 g/dL   HCT 31.2 (L) 39.0 - 52.0 %   MCV 73.8 (L) 78.0 - 100.0 fL   MCH 23.9 (L) 26.0 - 34.0 pg   MCHC 32.4  30.0 - 36.0 g/dL   RDW 16.3 (H) 11.5 - 15.5 %   Platelets 385 150 - 400 K/uL  I-stat troponin, ED (not at [facility])     Status: None    Collection Time: 08/29/14  6:58 PM  Result Value Ref Range   Troponin i, poc 0.02 0.00 - 0.08 ng/mL   Comment 3            Comment: Due to the release kinetics of cTnI, a negative result within the first hours of the onset of symptoms does not rule out myocardial infarction with certainty. If myocardial infarction is still suspected, repeat the test at appropriate intervals.   Urinalysis, Routine w reflex microscopic     Status: Abnormal   Collection Time: 08/29/14  8:52 PM  Result Value Ref Range   Color, Urine YELLOW YELLOW   APPearance CLOUDY (A) CLEAR   Specific Gravity, Urine 1.024 1.005 - 1.030   pH 5.5 5.0 - 8.0   Glucose, UA >1000 (A) NEGATIVE mg/dL   Hgb urine dipstick NEGATIVE NEGATIVE   Bilirubin Urine NEGATIVE NEGATIVE   Ketones, ur NEGATIVE NEGATIVE mg/dL   Protein, ur >300 (A) NEGATIVE mg/dL   Urobilinogen, UA 0.2 0.0 - 1.0 mg/dL   Nitrite POSITIVE (A) NEGATIVE   Leukocytes, UA NEGATIVE NEGATIVE  Urine microscopic-add on     Status: Abnormal   Collection Time: 08/29/14  8:52 PM  Result Value Ref Range   Squamous Epithelial / LPF RARE RARE   WBC, UA 11-20 <3 WBC/hpf   Bacteria, UA MANY (A) RARE   Casts GRANULAR CAST (A) NEGATIVE  Urine culture     Status: None (Preliminary result)   Collection Time: 08/29/14  8:52 PM  Result Value Ref Range   Specimen Description URINE, CLEAN CATCH    Special Requests NONE    Culture  Setup Time      08/30/2014 09:09 Performed at [place]      >=100,000 COLONIES/ML Performed at [place] Performed at [facility]    Report Status PENDING   Troponin I     Status: Abnormal   Collection Time: 08/29/14  9:29 PM  Result Value Ref Range   Troponin I 0.47 (HH) <0.30 ng/mL    Comment:        Due to the release kinetics of cTnI, a negative result within the first hours of the onset of symptoms does not rule out myocardial infarction  with certainty. If myocardial infarction is still suspected, repeat the test at appropriate intervals. CRITICAL RESULT CALLED TO, READ BACK BY AND VERIFIED WITH: CHALMBER B,RN 08/29/14 2225 WAYK   CBG monitoring, ED     Status: Abnormal   Collection Time: 08/30/14 12:13 AM  Result Value Ref Range   Glucose-Capillary 297 (H) 70 - 99 mg/dL  CBG monitoring, ED     Status: Abnormal   Collection Time: 08/30/14  1:14 AM  Result Value Ref Range   Glucose-Capillary 261 (H) 70 - 99 mg/dL  CBG monitoring, ED     Status: Abnormal   Collection Time: 08/30/14  2:16 AM  Result Value Ref Range   Glucose-Capillary 191 (H) 70 - 99 mg/dL  CBG monitoring, ED     Status: Abnormal   Collection Time: 08/30/14  3:16 AM  Result Value Ref Range   Glucose-Capillary 126 (H) 70 - 99 mg/dL  TSH  Status: None   Collection Time: 08/30/14  3:21 AM  Result Value Ref Range   TSH 2.860 0.350 - 4.500 uIU/mL  Hemoglobin A1c     Status: Abnormal   Collection Time: 08/30/14  3:30 AM  Result Value Ref Range   Hgb A1c MFr Bld 7.3 (H) <5.7 %    Comment: (NOTE)                                                                       According to the ADA Clinical Practice Recommendations for 2011, when HbA1c is used as a screening test:  >=6.5%   Diagnostic of Diabetes Mellitus           (if abnormal result is confirmed) 5.7-6.4%   Increased risk of developing Diabetes Mellitus References:Diagnosis and Classification of Diabetes Mellitus,Diabetes JIRC,7893,81(OFBPZ 1):S62-S69 and Standards of Medical Care in         Diabetes - 2011,Diabetes Care,2011,34 (Suppl 1):S11-S61.    Mean Plasma Glucose 163 (H) <117 mg/dL    Comment: Performed at [facility]  CBC     Status: Abnormal   Collection Time: 08/30/14  3:30 AM  Result Value Ref Range   WBC 9.3 4.0 - 10.5 K/uL   RBC 3.70 (L) 4.22 - 5.81 MIL/uL   Hemoglobin 8.8 (L) 13.0 - 17.0 g/dL   HCT 27.2 (L) 39.0 - 52.0 %   MCV 73.5 (L) 78.0 - 100.0 fL   MCH 23.8  (L) 26.0 - 34.0 pg   MCHC 32.4 30.0 - 36.0 g/dL   RDW 16.3 (H) 11.5 - 15.5 %   Platelets 349 150 - 400 K/uL  Comprehensive metabolic panel     Status: Abnormal   Collection Time: 08/30/14  3:30 AM  Result Value Ref Range   Sodium 143 137 - 147 mEq/L    Comment: DELTA CHECK NOTED   Potassium 3.7 3.7 - 5.3 mEq/L    Comment: DELTA CHECK NOTED   Chloride 101 96 - 112 mEq/L   CO2 28 19 - 32 mEq/L   Glucose, Bld 121 (H) 70 - 99 mg/dL   BUN 41 (H) 6 - 23 mg/dL   Creatinine, Ser 1.51 (H) 0.50 - 1.35 mg/dL   Calcium 9.1 8.4 - 10.5 mg/dL   Total Protein 6.2 6.0 - 8.3 g/dL   Albumin 2.8 (L) 3.5 - 5.2 g/dL   AST 23 0 - 37 U/L   ALT 37 0 - 53 U/L   Alkaline Phosphatase 69 39 - 117 U/L   Total Bilirubin <0.2 (L) 0.3 - 1.2 mg/dL   GFR calc non Af Amer 44 (L) >90 mL/min   GFR calc Af Amer 51 (L) >90 mL/min    Comment: (NOTE) The eGFR has been calculated using the CKD EPI equation. This calculation has not been validated in all clinical situations. eGFR's persistently <90 mL/min signify possible Chronic Kidney Disease.    Anion gap 14 5 - 15  Troponin I     Status: Abnormal   Collection Time: 08/30/14  3:30 AM  Result Value Ref Range   Troponin I 2.15 (HH) <0.30 ng/mL    Comment:        Due to the release kinetics of cTnI, a negative result  within the first hours of the onset of symptoms does not rule out myocardial infarction with certainty. If myocardial infarction is still suspected, repeat the test at appropriate intervals. CRITICAL VALUE NOTED.  VALUE IS CONSISTENT WITH PREVIOUSLY REPORTED AND CALLED VALUE.   CBG monitoring, ED     Status: Abnormal   Collection Time: 08/30/14  4:23 AM  Result Value Ref Range   Glucose-Capillary 100 (H) 70 - 99 mg/dL  MRSA PCR Screening     Status: None   Collection Time: 08/30/14  5:36 AM  Result Value Ref Range   MRSA by PCR NEGATIVE NEGATIVE    Comment:        The GeneXpert MRSA Assay (FDA approved for NASAL specimens only), is one  component of a comprehensive MRSA colonization surveillance program. It is not intended to diagnose MRSA infection nor to guide or monitor treatment for MRSA infections.   Glucose, capillary     Status: Abnormal   Collection Time: 08/30/14  6:08 AM  Result Value Ref Range   Glucose-Capillary 121 (H) 70 - 99 mg/dL   Comment 1 Capillary Sample   Glucose, capillary     Status: Abnormal   Collection Time: 08/30/14  7:44 AM  Result Value Ref Range   Glucose-Capillary 131 (H) 70 - 99 mg/dL   Comment 1 Capillary Sample   Troponin I     Status: Abnormal   Collection Time: 08/30/14  9:42 AM  Result Value Ref Range   Troponin I 2.18 (HH) <0.30 ng/mL    Comment:        Due to the release kinetics of cTnI, a negative result within the first hours of the onset of symptoms does not rule out myocardial infarction with certainty. If myocardial infarction is still suspected, repeat the test at appropriate intervals. CRITICAL VALUE NOTED.  VALUE IS CONSISTENT WITH PREVIOUSLY REPORTED AND CALLED VALUE.   Glucose, capillary     Status: Abnormal   Collection Time: 08/30/14 11:40 AM  Result Value Ref Range   Glucose-Capillary 109 (H) 70 - 99 mg/dL   Comment 1 Capillary Sample   CBC     Status: Abnormal   Collection Time: 08/30/14 12:37 PM  Result Value Ref Range   WBC 9.1 4.0 - 10.5 K/uL   RBC 3.76 (L) 4.22 - 5.81 MIL/uL   Hemoglobin 8.9 (L) 13.0 - 17.0 g/dL   HCT 27.8 (L) 39.0 - 52.0 %   MCV 73.9 (L) 78.0 - 100.0 fL   MCH 23.7 (L) 26.0 - 34.0 pg   MCHC 32.0 30.0 - 36.0 g/dL   RDW 16.5 (H) 11.5 - 15.5 %   Platelets 358 150 - 400 K/uL  Iron and TIBC     Status: None   Collection Time: 08/30/14 12:37 PM  Result Value Ref Range   Iron 75 42 - 135 ug/dL   TIBC 301 215 - 435 ug/dL   Saturation Ratios 25 20 - 55 %   UIBC 226 125 - 400 ug/dL    Comment: Performed at [facility]  Ferritin     Status: None   Collection Time: 08/30/14 12:37 PM  Result Value Ref Range   Ferritin 24  22 - 322 ng/mL    Comment: Performed at [facility]  Glucose, capillary     Status: Abnormal   Collection Time: 08/30/14  5:02 PM  Result Value Ref Range   Glucose-Capillary 109 (H) 70 - 99 mg/dL   Comment 1 Capillary Sample  Troponin I     Status: Abnormal   Collection Time: 08/30/14  5:15 PM  Result Value Ref Range   Troponin I 1.68 (HH) <0.30 ng/mL    Comment:        Due to the release kinetics of cTnI, a negative result within the first hours of the onset of symptoms does not rule out myocardial infarction with certainty. If myocardial infarction is still suspected, repeat the test at appropriate intervals. CRITICAL VALUE NOTED.  VALUE IS CONSISTENT WITH PREVIOUSLY REPORTED AND CALLED VALUE.   Glucose, capillary     Status: Abnormal   Collection Time: 08/30/14  7:46 PM  Result Value Ref Range   Glucose-Capillary 252 (H) 70 - 99 mg/dL   Comment 1 Capillary Sample   Glucose, capillary     Status: Abnormal   Collection Time: 08/30/14 11:58 PM  Result Value Ref Range   Glucose-Capillary 230 (H) 70 - 99 mg/dL   Comment 1 Capillary Sample   CBC     Status: Abnormal   Collection Time: 08/31/14  1:42 AM  Result Value Ref Range   WBC 7.8 4.0 - 10.5 K/uL   RBC 3.75 (L) 4.22 - 5.81 MIL/uL   Hemoglobin 9.1 (L) 13.0 - 17.0 g/dL   HCT 28.7 (L) 39.0 - 52.0 %   MCV 76.5 (L) 78.0 - 100.0 fL   MCH 24.3 (L) 26.0 - 34.0 pg   MCHC 31.7 30.0 - 36.0 g/dL   RDW 16.6 (H) 11.5 - 15.5 %   Platelets 320 150 - 400 K/uL  Basic metabolic panel     Status: Abnormal   Collection Time: 08/31/14  1:42 AM  Result Value Ref Range   Sodium 137 137 - 147 mEq/L   Potassium 4.9 3.7 - 5.3 mEq/L    Comment: DELTA CHECK NOTED   Chloride 99 96 - 112 mEq/L   CO2 25 19 - 32 mEq/L   Glucose, Bld 224 (H) 70 - 99 mg/dL   BUN 38 (H) 6 - 23 mg/dL   Creatinine, Ser 1.60 (H) 0.50 - 1.35 mg/dL   Calcium 8.7 8.4 - 10.5 mg/dL   GFR calc non Af Amer 41 (L) >90 mL/min   GFR calc Af Amer 48 (L) >90 mL/min      Comment: (NOTE) The eGFR has been calculated using the CKD EPI equation. This calculation has not been validated in all clinical situations. eGFR's persistently <90 mL/min signify possible Chronic Kidney Disease.    Anion gap 13 5 - 15  Glucose, capillary     Status: Abnormal   Collection Time: 08/31/14  4:07 AM  Result Value Ref Range   Glucose-Capillary 178 (H) 70 - 99 mg/dL   Comment 1 Capillary Sample   Occult blood card to lab, stool     Status: Abnormal   Collection Time: 08/31/14  6:59 AM  Result Value Ref Range   Fecal Occult Bld POSITIVE (A) NEGATIVE  Glucose, capillary     Status: Abnormal   Collection Time: 08/31/14  8:16 AM  Result Value Ref Range   Glucose-Capillary 132 (H) 70 - 99 mg/dL   Comment 1 Capillary Sample   Heparin level (unfractionated)     Status: Abnormal   Collection Time: 08/31/14  8:20 AM  Result Value Ref Range   Heparin Unfractionated 0.27 (L) 0.30 - 0.70 IU/mL    Comment:        IF HEPARIN RESULTS ARE BELOW EXPECTED VALUES, AND PATIENT DOSAGE HAS BEEN CONFIRMED, SUGGEST  FOLLOW UP TESTING OF ANTITHROMBIN III LEVELS.   Glucose, capillary     Status: Abnormal   Collection Time: 08/31/14 11:10 AM  Result Value Ref Range   Glucose-Capillary 259 (H) 70 - 99 mg/dL   Comment 1 Capillary Sample     Imaging: Dg Chest Port 1 View  08/29/2014   CLINICAL DATA:  Left-sided chest pain today.  EXAM: PORTABLE CHEST - 1 VIEW  COMPARISON:  08/26/2014  FINDINGS: Lungs are adequately inflated without consolidation or effusion. Borderline cardiomegaly unchanged. Remainder of the exam is unchanged.  IMPRESSION: No acute cardiopulmonary disease.  Borderline cardiomegaly.   Electronically Signed   By: Marin Olp M.D.   On: 08/29/2014 19:18    Assessment:  1. Principal Problem: 2.   NSTEMI (non-ST elevated myocardial infarction) 3. Active Problems: 4.   Hyperlipidemia 5.   CAD (coronary artery disease), native coronary artery 6.   COPD (chronic  obstructive pulmonary disease) 7.   Type 2 diabetes mellitus with vascular disease 8.   CKD (chronic kidney disease) stage 3, GFR 30-59 ml/min 9.   Chronic combined systolic and diastolic congestive heart failure 10.   Diabetes type 2, uncontrolled 11.   Polymyalgia rheumatica 12.   Anemia 13.   Hyperglycemia 14.   Plan:  D/c heparin gtts. BP improved with current medications. CP was associated with hypertension, however, not clear about cause/effect. He is on aspirin and imdur, lasix and b-blocker. Transition lasix to 40 mg po BID starting tonight.  Time Spent Directly with Patient:  15 minutes  Length of Stay:  LOS: 2 days   Pixie Casino, MD, [facility] Attending Cardiologist [facility]  HILTY,Kenneth C 08/31/2014, 12:35 PM

## 2014-09-01 DIAGNOSIS — R0789 Other chest pain: Secondary | ICD-10-CM

## 2014-09-01 DIAGNOSIS — I5042 Chronic combined systolic (congestive) and diastolic (congestive) heart failure: Secondary | ICD-10-CM

## 2014-09-01 DIAGNOSIS — M353 Polymyalgia rheumatica: Secondary | ICD-10-CM

## 2014-09-01 LAB — GLUCOSE, CAPILLARY
GLUCOSE-CAPILLARY: 143 mg/dL — AB (ref 70–99)
GLUCOSE-CAPILLARY: 419 mg/dL — AB (ref 70–99)
Glucose-Capillary: 106 mg/dL — ABNORMAL HIGH (ref 70–99)
Glucose-Capillary: 143 mg/dL — ABNORMAL HIGH (ref 70–99)
Glucose-Capillary: 175 mg/dL — ABNORMAL HIGH (ref 70–99)
Glucose-Capillary: 189 mg/dL — ABNORMAL HIGH (ref 70–99)
Glucose-Capillary: 288 mg/dL — ABNORMAL HIGH (ref 70–99)

## 2014-09-01 LAB — CBC
HCT: 28.3 % — ABNORMAL LOW (ref 39.0–52.0)
Hemoglobin: 9.3 g/dL — ABNORMAL LOW (ref 13.0–17.0)
MCH: 24.9 pg — AB (ref 26.0–34.0)
MCHC: 32.9 g/dL (ref 30.0–36.0)
MCV: 75.7 fL — ABNORMAL LOW (ref 78.0–100.0)
Platelets: 337 10*3/uL (ref 150–400)
RBC: 3.74 MIL/uL — AB (ref 4.22–5.81)
RDW: 16.5 % — ABNORMAL HIGH (ref 11.5–15.5)
WBC: 9.4 10*3/uL (ref 4.0–10.5)

## 2014-09-01 LAB — URINE CULTURE: Colony Count: >=100000

## 2014-09-01 MED ORDER — PREDNISONE 10 MG PO TABS
10.0000 mg | ORAL_TABLET | Freq: Every day | ORAL | Status: DC
  Administered 2014-09-02 – 2014-09-03 (×2): 10 mg via ORAL
  Filled 2014-09-01 (×3): qty 1

## 2014-09-01 MED ORDER — GLIPIZIDE 10 MG PO TABS
10.0000 mg | ORAL_TABLET | Freq: Two times a day (BID) | ORAL | Status: DC
  Administered 2014-09-01 – 2014-09-02 (×3): 10 mg via ORAL
  Filled 2014-09-01 (×5): qty 1

## 2014-09-01 MED ORDER — INSULIN GLARGINE 100 UNIT/ML ~~LOC~~ SOLN
10.0000 [IU] | Freq: Every day | SUBCUTANEOUS | Status: DC
  Administered 2014-09-01 – 2014-09-02 (×2): 10 [IU] via SUBCUTANEOUS
  Filled 2014-09-01 (×2): qty 0.1

## 2014-09-01 MED ORDER — AMPICILLIN 250 MG PO CAPS
250.0000 mg | ORAL_CAPSULE | Freq: Four times a day (QID) | ORAL | Status: DC
  Administered 2014-09-01 – 2014-09-03 (×6): 250 mg via ORAL
  Filled 2014-09-01 (×12): qty 1

## 2014-09-01 MED ORDER — HYDRALAZINE HCL 50 MG PO TABS
75.0000 mg | ORAL_TABLET | Freq: Three times a day (TID) | ORAL | Status: DC
  Administered 2014-09-01 – 2014-09-03 (×6): 75 mg via ORAL
  Filled 2014-09-01 (×11): qty 1

## 2014-09-01 NOTE — Progress Notes (Signed)
DAILY PROGRESS NOTE  Subjective:  BP remains elevated today. Twinge of CP yesterday, but otherwise resolved. Most of it is musculoskeletal.  Objective:  Temp:  [97.6 F (36.4 C)-98.4 F (36.9 C)] 98.1 F (36.7 C) (11/15 0832) Pulse Rate:  [64-80] 80 (11/15 0832) Resp:  [15-26] 26 (11/15 0832) BP: (144-176)/(38-70) 176/52 mmHg (11/15 0832) SpO2:  [93 %-97 %] 96 % (11/15 0832) Weight change:   Intake/Output from previous day: 11/14 0701 - 11/15 0700 In: 1750.5 [P.O.:1040; I.V.:610.5; IV Piggyback:100] Out: 1660 [Urine:1660]  Intake/Output from this shift:    Medications: Current Facility-Administered Medications  Medication Dose Route Frequency Provider Last Rate Last Dose  . 0.9 %  sodium chloride infusion   Intravenous Continuous Allyne Gee, MD 50 mL/hr at 08/31/14 [phone]    . acetaminophen (TYLENOL) tablet 650 mg  650 mg Oral Q6H PRN Allyne Gee, MD   650 mg at 08/30/14 1850   Or  . acetaminophen (TYLENOL) suppository 650 mg  650 mg Rectal Q6H PRN Allyne Gee, MD      . albuterol (PROVENTIL) (2.5 MG/3ML) 0.083% nebulizer solution 2.5 mg  2.5 mg Nebulization Q6H PRN Allyne Gee, MD      . amLODipine (NORVASC) tablet 10 mg  10 mg Oral Daily Allyne Gee, MD   10 mg at 08/31/14 0944  . aspirin EC tablet 325 mg  325 mg Oral Daily Allyne Gee, MD   325 mg at 08/31/14 0944  . atorvastatin (LIPITOR) tablet 20 mg  20 mg Oral q1800 Allyne Gee, MD   20 mg at 08/31/14 1738  . cefTRIAXone (ROCEPHIN) 1 g in dextrose 5 % 50 mL IVPB  1 g Intravenous Q24H Virgel Manifold, MD 100 mL/hr at 08/31/14 2209 1 g at 08/31/14 2209  . cholecalciferol (VITAMIN D) tablet 1,000 Units  1,000 Units Oral Daily Allyne Gee, MD   1,000 Units at 08/31/14 0944  . dextrose 50 % solution 25 mL  25 mL Intravenous PRN Allyne Gee, MD      . folic acid (FOLVITE) tablet 1 mg  1 mg Oral Daily Allyne Gee, MD   1 mg at 08/31/14 0943  . furosemide (LASIX) tablet 40 mg  40 mg Oral BID Pixie Casino, MD   40 mg at 08/31/14 1738  . hydrALAZINE (APRESOLINE) injection 10 mg  10 mg Intravenous Q6H PRN Leonie Man, MD      . hydrALAZINE (APRESOLINE) tablet 50 mg  50 mg Oral TID Allyne Gee, MD   50 mg at 09/01/14 0623  . insulin aspart (novoLOG) injection 0-9 Units  0-9 Units Subcutaneous 6 times per day Geradine Girt, DO   1 Units at 09/01/14 0000  . insulin glargine (LANTUS) injection 15 Units  15 Units Subcutaneous QHS Geradine Girt, DO   15 Units at 08/31/14 2146  . irbesartan (AVAPRO) tablet 150 mg  150 mg Oral q morning - 10a Allyne Gee, MD   150 mg at 08/31/14 0943  . isosorbide mononitrate (IMDUR) 24 hr tablet 120 mg  120 mg Oral Daily Allyne Gee, MD   120 mg at 08/31/14 0944  . metoprolol tartrate (LOPRESSOR) tablet 50 mg  50 mg Oral BID Allyne Gee, MD   50 mg at 08/31/14 2146  . morphine 2 MG/ML injection 2 mg  2 mg Intravenous Q1H PRN Leonie Man, MD   2 mg at 08/31/14 7628  .  multivitamin with minerals tablet 1 tablet  1 tablet Oral Daily Allyne Gee, MD   1 tablet at 08/31/14 [phone]  . nitroGLYCERIN (NITROSTAT) SL tablet 0.4 mg  0.4 mg Sublingual Q5 min PRN Allyne Gee, MD   0.4 mg at 08/31/14 0920  . ondansetron (ZOFRAN) tablet 4 mg  4 mg Oral Q6H PRN Allyne Gee, MD       Or  . ondansetron [facility]) injection 4 mg  4 mg Intravenous Q6H PRN Allyne Gee, MD      . oxyCODONE (Oxy IR/ROXICODONE) immediate release tablet 5 mg  5 mg Oral Q4H PRN Allyne Gee, MD      . pantoprazole (PROTONIX) EC tablet 40 mg  40 mg Oral Daily Allyne Gee, MD   40 mg at 08/31/14 0943  . predniSONE (DELTASONE) tablet 15 mg  15 mg Oral Q breakfast Geradine Girt, DO   15 mg at 08/31/14 0810  . sodium chloride 0.9 % injection 3 mL  3 mL Intravenous Q12H Allyne Gee, MD   3 mL at 08/31/14 2149  . thiamine (VITAMIN B-1) tablet 100 mg  100 mg Oral Daily Allyne Gee, MD   100 mg at 08/31/14 0944  . zolpidem (AMBIEN) tablet 5 mg  5 mg Oral QHS PRN Allyne Gee, MD         Physical Exam: General appearance: alert and no distress Neck: no carotid bruit and no JVD Lungs: clear to auscultation bilaterally Heart: regular rate and rhythm Abdomen: soft, non-tender; bowel sounds normal; no masses,  no organomegaly Extremities: extremities normal, atraumatic, no cyanosis or edema Pulses: 2+ and symmetric Skin: Skin color, texture, turgor normal. No rashes or lesions Neurologic: Grossly normal PSych: Pleasant  Lab Results: Results for orders placed or performed during the hospital encounter of 08/29/14 (from the past 48 hour(s))  Glucose, capillary     Status: Abnormal   Collection Time: 08/30/14 11:40 AM  Result Value Ref Range   Glucose-Capillary 109 (H) 70 - 99 mg/dL   Comment 1 Capillary Sample   CBC     Status: Abnormal   Collection Time: 08/30/14 12:37 PM  Result Value Ref Range   WBC 9.1 4.0 - 10.5 K/uL   RBC 3.76 (L) 4.22 - 5.81 MIL/uL   Hemoglobin 8.9 (L) 13.0 - 17.0 g/dL   HCT 27.8 (L) 39.0 - 52.0 %   MCV 73.9 (L) 78.0 - 100.0 fL   MCH 23.7 (L) 26.0 - 34.0 pg   MCHC 32.0 30.0 - 36.0 g/dL   RDW 16.5 (H) 11.5 - 15.5 %   Platelets 358 150 - 400 K/uL  Iron and TIBC     Status: None   Collection Time: 08/30/14 12:37 PM  Result Value Ref Range   Iron 75 42 - 135 ug/dL   TIBC 301 215 - 435 ug/dL   Saturation Ratios 25 20 - 55 %   UIBC 226 125 - 400 ug/dL    Comment: Performed at [facility]  Ferritin     Status: None   Collection Time: 08/30/14 12:37 PM  Result Value Ref Range   Ferritin 24 22 - 322 ng/mL    Comment: Performed at [facility]  Glucose, capillary     Status: Abnormal   Collection Time: 08/30/14  5:02 PM  Result Value Ref Range   Glucose-Capillary 109 (H) 70 - 99 mg/dL   Comment 1 Capillary Sample   Troponin I  Status: Abnormal   Collection Time: 08/30/14  5:15 PM  Result Value Ref Range   Troponin I 1.68 (HH) <0.30 ng/mL    Comment:        Due to the release kinetics of cTnI, a negative result  within the first hours of the onset of symptoms does not rule out myocardial infarction with certainty. If myocardial infarction is still suspected, repeat the test at appropriate intervals. CRITICAL VALUE NOTED.  VALUE IS CONSISTENT WITH PREVIOUSLY REPORTED AND CALLED VALUE.   Glucose, capillary     Status: Abnormal   Collection Time: 08/30/14  7:46 PM  Result Value Ref Range   Glucose-Capillary 252 (H) 70 - 99 mg/dL   Comment 1 Capillary Sample   Glucose, capillary     Status: Abnormal   Collection Time: 08/30/14 11:58 PM  Result Value Ref Range   Glucose-Capillary 230 (H) 70 - 99 mg/dL   Comment 1 Capillary Sample   CBC     Status: Abnormal   Collection Time: 08/31/14  1:42 AM  Result Value Ref Range   WBC 7.8 4.0 - 10.5 K/uL   RBC 3.75 (L) 4.22 - 5.81 MIL/uL   Hemoglobin 9.1 (L) 13.0 - 17.0 g/dL   HCT 28.7 (L) 39.0 - 52.0 %   MCV 76.5 (L) 78.0 - 100.0 fL   MCH 24.3 (L) 26.0 - 34.0 pg   MCHC 31.7 30.0 - 36.0 g/dL   RDW 16.6 (H) 11.5 - 15.5 %   Platelets 320 150 - 400 K/uL  Basic metabolic panel     Status: Abnormal   Collection Time: 08/31/14  1:42 AM  Result Value Ref Range   Sodium 137 137 - 147 mEq/L   Potassium 4.9 3.7 - 5.3 mEq/L    Comment: DELTA CHECK NOTED   Chloride 99 96 - 112 mEq/L   CO2 25 19 - 32 mEq/L   Glucose, Bld 224 (H) 70 - 99 mg/dL   BUN 38 (H) 6 - 23 mg/dL   Creatinine, Ser 1.60 (H) 0.50 - 1.35 mg/dL   Calcium 8.7 8.4 - 10.5 mg/dL   GFR calc non Af Amer 41 (L) >90 mL/min   GFR calc Af Amer 48 (L) >90 mL/min    Comment: (NOTE) The eGFR has been calculated using the CKD EPI equation. This calculation has not been validated in all clinical situations. eGFR's persistently <90 mL/min signify possible Chronic Kidney Disease.    Anion gap 13 5 - 15  Glucose, capillary     Status: Abnormal   Collection Time: 08/31/14  4:07 AM  Result Value Ref Range   Glucose-Capillary 178 (H) 70 - 99 mg/dL   Comment 1 Capillary Sample   Occult blood card to lab,  stool     Status: Abnormal   Collection Time: 08/31/14  6:59 AM  Result Value Ref Range   Fecal Occult Bld POSITIVE (A) NEGATIVE  Glucose, capillary     Status: Abnormal   Collection Time: 08/31/14  8:16 AM  Result Value Ref Range   Glucose-Capillary 132 (H) 70 - 99 mg/dL   Comment 1 Capillary Sample   Heparin level (unfractionated)     Status: Abnormal   Collection Time: 08/31/14  8:20 AM  Result Value Ref Range   Heparin Unfractionated 0.27 (L) 0.30 - 0.70 IU/mL    Comment:        IF HEPARIN RESULTS ARE BELOW EXPECTED VALUES, AND PATIENT DOSAGE HAS BEEN CONFIRMED, SUGGEST FOLLOW UP TESTING OF ANTITHROMBIN III  LEVELS.   Glucose, capillary     Status: Abnormal   Collection Time: 08/31/14 11:10 AM  Result Value Ref Range   Glucose-Capillary 259 (H) 70 - 99 mg/dL   Comment 1 Capillary Sample   Glucose, capillary     Status: Abnormal   Collection Time: 08/31/14  4:22 PM  Result Value Ref Range   Glucose-Capillary 366 (H) 70 - 99 mg/dL   Comment 1 Capillary Sample   Heparin level (unfractionated)     Status: Abnormal   Collection Time: 08/31/14  5:32 PM  Result Value Ref Range   Heparin Unfractionated <0.10 (L) 0.30 - 0.70 IU/mL    Comment:        IF HEPARIN RESULTS ARE BELOW EXPECTED VALUES, AND PATIENT DOSAGE HAS BEEN CONFIRMED, SUGGEST FOLLOW UP TESTING OF ANTITHROMBIN III LEVELS.   Glucose, capillary     Status: Abnormal   Collection Time: 08/31/14  8:09 PM  Result Value Ref Range   Glucose-Capillary 349 (H) 70 - 99 mg/dL   Comment 1 Capillary Sample   Glucose, capillary     Status: Abnormal   Collection Time: 08/31/14 11:51 PM  Result Value Ref Range   Glucose-Capillary 143 (H) 70 - 99 mg/dL   Comment 1 Capillary Sample   CBC     Status: Abnormal   Collection Time: 09/01/14  2:29 AM  Result Value Ref Range   WBC 9.4 4.0 - 10.5 K/uL   RBC 3.74 (L) 4.22 - 5.81 MIL/uL   Hemoglobin 9.3 (L) 13.0 - 17.0 g/dL   HCT 28.3 (L) 39.0 - 52.0 %   MCV 75.7 (L) 78.0 - 100.0  fL   MCH 24.9 (L) 26.0 - 34.0 pg   MCHC 32.9 30.0 - 36.0 g/dL   RDW 16.5 (H) 11.5 - 15.5 %   Platelets 337 150 - 400 K/uL  Glucose, capillary     Status: Abnormal   Collection Time: 09/01/14  3:58 AM  Result Value Ref Range   Glucose-Capillary 106 (H) 70 - 99 mg/dL   Comment 1 Capillary Sample   Glucose, capillary     Status: Abnormal   Collection Time: 09/01/14  8:29 AM  Result Value Ref Range   Glucose-Capillary 143 (H) 70 - 99 mg/dL    Imaging: No results found.  Assessment:  Principal Problem:   NSTEMI (non-ST elevated myocardial infarction) Active Problems:   Hyperlipidemia   CAD (coronary artery disease), native coronary artery   COPD (chronic obstructive pulmonary disease)   Type 2 diabetes mellitus with vascular disease   CKD (chronic kidney disease) stage 3, GFR 30-59 ml/min   Chronic combined systolic and diastolic congestive heart failure   Diabetes type 2, uncontrolled   Polymyalgia rheumatica   Anemia   Hyperglycemia   Pain in the chest   Plan: Increase hydralazine to 75 mg TID. Can transfer to telemetry floor from a cardiac perspective. Working toward discharge, possibly tomorrow.  Time Spent Directly with Patient:  15 minutes  Length of Stay:  LOS: 3 days   Pixie Casino, MD, [facility] Attending Cardiologist [facility]  Kenza Munar C 09/01/2014, 9:44 AM

## 2014-09-01 NOTE — Progress Notes (Signed)
Chart reviewed.   PROGRESS NOTE  [name] XKG:[id number] DOB: 09/13/1941 DOA: 08/29/2014 PCP: Shirline Frees, MD   [name] is a 73 y.o. male presents with chest pain. He had been discharged yesterday after an admission for congestive heart failure. He was treated with diuretics and responded well so was sent home. He states that today he started having chest pain. Patient states that the pain was located in the center chest and was sharp type of pain. He states that the pain was similar to prior episodes when he has had an MI. 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 During his last stay he was noted to have an EF of 45-50%. Patient states that he was also recently started on prednisone for PMR and has been on 20mg  daily. His sugars were noted to be 498 in the ER.  Troponins turned positive and patient was cathed 11/14   Assessment/Plan: Diabetes Mellitus type 2 uncontrolled Better. Resume glipizide. Change to bid. Change lantus to 10. Insulin self teachin -HgbA1C 7.3  CAD -s/p cath -recent graftsopen  COPD -nebulizer prn for wheeze and SOB -oxygen as needed  CHF -lasix as per cardiology orders  Hyperlipidemia -continue home medications  UTI Enterococcus species. 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 Never had bx. No symptoms now. Decrease to 10 mg, given no definate diagnosis, symptoms, and worsening of DM and CHF.  Anemia -Fe studies -repeat cbc -heme test stools  OSA -has not been wearing his CPAP at night -start now  Transfer to tele. Increase activity.  Code Status: full Family Communication: patient/wife Disposition Plan:    Consultants:  cardiology  Procedures:  Cath 11/14    HPI/Subjective: No SOB. No CP. Willing to learn about insulin injection.   Objective: Filed Vitals:   09/01/14 1001  BP: 171/59  Pulse: 87  Temp:   Resp:      Intake/Output Summary (Last 24 hours) at 09/01/14 1451 Last data filed at 09/01/14 1000  Gross per 24 hour  Intake   1870 ml  Output   1660 ml  Net    210 ml   Filed Weights   08/29/14 1836 08/30/14 0540  Weight: 73.936 kg (163 lb) 77.6 kg (171 lb 1.2 oz)    Exam:   General:  A+Ox3, NAD  Cardiovascular: rrr without MGR  Respiratory: clear without WRR  Abdomen: +BS, soft  Musculoskeletal: no CCE  Data Reviewed: Basic Metabolic Panel:  Recent Labs Lab 08/27/14 0700 08/28/14 0414 08/29/14 1844 08/30/14 0330 08/31/14 0142  NA 143 142 135* 143 137  K 4.3 4.1 4.9 3.7 4.9  CL 102 102 93* 101 99  CO2 29 29 22 28 25   GLUCOSE 163* 148* 493* 121* 224*  BUN 32* 42* 45* 41* 38*  CREATININE 1.33 1.58* 1.74* 1.51* 1.60*  CALCIUM 9.2 9.1 9.2 9.1 8.7   Liver Function Tests:  Recent Labs Lab 08/26/14 0650 08/30/14 0330  AST 23 23  ALT 48 37  ALKPHOS 75 69  BILITOT <0.2* <0.2*  PROT 6.9 6.2  ALBUMIN 2.8* 2.8*   No results for input(s): LIPASE, AMYLASE in the last 168 hours. No results for input(s): AMMONIA in the last 168 hours. CBC:  Recent Labs Lab 08/29/14 1844 08/30/14 0330 08/30/14 1237 08/31/14 0142 09/01/14 0229  WBC 10.3 9.3 9.1 7.8 9.4  HGB 10.1* 8.8* 8.9* 9.1* 9.3*  HCT 31.2* 27.2*  27.8* 28.7* 28.3*  MCV 73.8* 73.5* 73.9* 76.5* 75.7*  PLT 385 349 358 320 337   Cardiac Enzymes:  Recent Labs Lab 08/26/14 0925 08/29/14 2129 08/30/14 0330 08/30/14 0942 08/30/14 1715  TROPONINI <0.30 0.47* 2.15* 2.18* 1.68*   BNP (last 3 results)  Recent Labs  02/25/14 2302 08/26/14 0056 08/27/14 0700  PROBNP 182.4* 5264.0* 2854.0*   CBG:  Recent Labs Lab 08/31/14 2009 08/31/14 2351 09/01/14 0358 09/01/14 0829 09/01/14 1233  GLUCAP 349* 143* 106* 143* 175*    Recent Results (from the past 240 hour(s))  Urine culture     Status: None   Collection Time: 08/26/14  1:52 AM  Result Value Ref Range Status   Specimen Description URINE,  CATHETERIZED  Final   Special Requests ADDED 0316  Final   Culture  Setup Time   Final    08/26/2014 08:20 Performed at [facility]   Final    30,000 COLONIES/ML Performed at [facility]    Culture   Final    Multiple bacterial morphotypes present, none predominant. Suggest appropriate recollection if clinically indicated. Performed at [facility]    Report Status 08/27/2014 FINAL  Final  Urine culture     Status: None   Collection Time: 08/29/14  8:52 PM  Result Value Ref Range Status   Specimen Description URINE, CLEAN CATCH  Final   Special Requests NONE  Final   Culture  Setup Time   Final    08/30/2014 09:09 Performed at [place]   Final    >=100,000 COLONIES/ML Performed at [facility]    Culture   Final    ENTEROCOCCUS SPECIES Performed at [facility]    Report Status 09/01/2014 FINAL  Final   Organism ID, Bacteria ENTEROCOCCUS SPECIES  Final      Susceptibility   Enterococcus species - MIC*    AMPICILLIN <=2 SENSITIVE Sensitive     LEVOFLOXACIN 1 SENSITIVE Sensitive     NITROFURANTOIN <=16 SENSITIVE Sensitive     VANCOMYCIN 2 SENSITIVE Sensitive     TETRACYCLINE >=16 RESISTANT Resistant     * ENTEROCOCCUS SPECIES  MRSA PCR Screening     Status: None   Collection Time: 08/30/14  5:36 AM  Result Value Ref Range Status   MRSA by PCR NEGATIVE NEGATIVE Final    Comment:        The GeneXpert MRSA Assay (FDA approved for NASAL specimens only), is one component of a comprehensive MRSA colonization surveillance program. It is not intended to diagnose MRSA infection nor to guide or monitor treatment for MRSA infections.      Studies: No results found.  Scheduled Meds: . amLODipine  10 mg Oral Daily  . aspirin EC  325 mg Oral Daily  . atorvastatin  20 mg Oral q1800  . cefTRIAXone (ROCEPHIN)  IV  1 g Intravenous Q24H  . cholecalciferol  1,000 Units Oral Daily  .  folic acid  1 mg Oral Daily  . furosemide  40 mg Oral BID  . hydrALAZINE  75 mg Oral TID  . insulin aspart  0-9 Units Subcutaneous 6 times per day  . insulin glargine  15 Units Subcutaneous QHS  . irbesartan  150 mg Oral q morning - 10a  . isosorbide mononitrate  120 mg Oral Daily  . metoprolol  50 mg Oral BID  . multivitamin with minerals  1 tablet Oral Daily  .  pantoprazole  40 mg Oral Daily  . predniSONE  15 mg Oral Q breakfast  . sodium chloride  3 mL Intravenous Q12H  . thiamine  100 mg Oral Daily   Continuous Infusions: . sodium chloride 50 mL/hr at 08/31/14 9794   Antibiotics Given (last 72 hours)    None     Time spent: 25 min  [facility] Pager [phone]. If 7PM-7AM, please contact night-coverage at [email or web address], password [facility] 09/01/2014, 2:51 PM  LOS: 3 days

## 2014-09-01 NOTE — Progress Notes (Signed)
Inpatient Diabetes Program Recommendations  AACE/ADA: New Consensus Statement on Inpatient Glycemic Control (2013)  Target Ranges:  Prepandial:   less than 140 mg/dL      Peak postprandial:   less than 180 mg/dL (1-2 hours)      Critically ill patients:  140 - 180 mg/dL   Reason for Assessmenit: Diabetes Consult  72 year old male, recently discharged from [facility] in the past few days, presents to ED with CP. Hx DM2.  Diabetes history: DM2 Outpatient Diabetes medications: metformin 1000 mg bid and glipizide 10 mg QHS, Prednisone 20 mg with breakfast QD Current orders for Inpatient glycemic control: Lantus 15 units QHS, Novolog sensitive Q4H On Prednisone 15 mg QAM at present.  HgbA1C - 7.3%. Doubtful this is accurate with low H/H   Recently started on Prednisone for fibromyalgia which may have caused increase in blood sugars. Received first dose of Lantus 15 units QHS last night. Excellent blood sugars thus far today.  Agree with basal insulin while on steroids. Would refer pt back to PCP for diabetes management. Might be hesitant to send home on basal insulin since pt has had hypoglycemia in the past. May consider Novolog sensitive correction insulin if pt is continued on steroids. Would benefit from OP Diabetes Education for additional assistance after discharge. F/U with PCP for glucose management. Recommendations: Change Novolog sensitive to tidwc since pt is eating CHO mod diet.  Thank you. Lorenda Peck, RD, LDN, CDE Inpatient Diabetes Coordinator [phone]

## 2014-09-01 NOTE — Plan of Care (Signed)
Problem: Phase II Progression Outcomes Goal: Hemodynamically stable Outcome: Completed/Met Date Met:  09/01/14 Goal: Anginal pain relieved Outcome: Completed/Met Date Met:  09/01/14 Goal: Stress Test if indicated Outcome: Not Applicable Date Met:  77/03/40 Goal: Cath/PCI Day Path if indicated Outcome: Completed/Met Date Met:  09/01/14

## 2014-09-02 ENCOUNTER — Ambulatory Visit: Payer: Self-pay | Admitting: Interventional Cardiology

## 2014-09-02 DIAGNOSIS — N183 Chronic kidney disease, stage 3 (moderate): Secondary | ICD-10-CM

## 2014-09-02 DIAGNOSIS — D638 Anemia in other chronic diseases classified elsewhere: Secondary | ICD-10-CM

## 2014-09-02 LAB — GLUCOSE, CAPILLARY
GLUCOSE-CAPILLARY: 74 mg/dL (ref 70–99)
GLUCOSE-CAPILLARY: 292 mg/dL — AB (ref 70–99)
Glucose-Capillary: 128 mg/dL — ABNORMAL HIGH (ref 70–99)
Glucose-Capillary: 165 mg/dL — ABNORMAL HIGH (ref 70–99)
Glucose-Capillary: 247 mg/dL — ABNORMAL HIGH (ref 70–99)

## 2014-09-02 LAB — BASIC METABOLIC PANEL
ANION GAP: 13 (ref 5–15)
BUN: 40 mg/dL — AB (ref 6–23)
CHLORIDE: 99 meq/L (ref 96–112)
CO2: 27 mEq/L (ref 19–32)
Calcium: 9.1 mg/dL (ref 8.4–10.5)
Creatinine, Ser: 1.79 mg/dL — ABNORMAL HIGH (ref 0.50–1.35)
GFR calc non Af Amer: 36 mL/min — ABNORMAL LOW (ref >90)
GFR, EST AFRICAN AMERICAN: 42 mL/min — AB (ref >90)
Glucose, Bld: 93 mg/dL (ref 70–99)
Potassium: 4.1 mEq/L (ref 3.7–5.3)
Sodium: 139 mEq/L (ref 137–147)

## 2014-09-02 LAB — MAGNESIUM: Magnesium: 2 mg/dL (ref 1.5–2.5)

## 2014-09-02 MED ORDER — METOPROLOL TARTRATE 50 MG PO TABS
75.0000 mg | ORAL_TABLET | Freq: Two times a day (BID) | ORAL | Status: DC
  Administered 2014-09-02 – 2014-09-03 (×3): 75 mg via ORAL
  Filled 2014-09-02 (×6): qty 1

## 2014-09-02 MED ORDER — INSULIN STARTER KIT- PEN NEEDLES (ENGLISH)
1.0000 | Freq: Once | Status: AC
  Administered 2014-09-02: 1
  Filled 2014-09-02: qty 1

## 2014-09-02 MED ORDER — INSULIN ASPART 100 UNIT/ML ~~LOC~~ SOLN
0.0000 [IU] | Freq: Three times a day (TID) | SUBCUTANEOUS | Status: DC
  Administered 2014-09-02: 5 [IU] via SUBCUTANEOUS
  Administered 2014-09-02: 2 [IU] via SUBCUTANEOUS

## 2014-09-02 MED ORDER — ENOXAPARIN SODIUM 30 MG/0.3ML ~~LOC~~ SOLN
30.0000 mg | SUBCUTANEOUS | Status: DC
  Administered 2014-09-02: 30 mg via SUBCUTANEOUS
  Filled 2014-09-02: qty 0.3

## 2014-09-02 NOTE — Progress Notes (Signed)
UR Completed Yoniel Arkwright Graves-Bigelow, RN,BSN [phone]  

## 2014-09-02 NOTE — Progress Notes (Signed)
Patient refused CPAP for QHS use.  Patient was encouraged to call for Respiratory if needed.

## 2014-09-02 NOTE — Progress Notes (Signed)
At 0432 Pt had run of SVT. Pt asymptomatic. Cont to monitor.

## 2014-09-02 NOTE — Progress Notes (Signed)
Pts CBG 74 at 0400 check. Pt asymptomatic. Pt given snack. Will cont to monitor.

## 2014-09-02 NOTE — Progress Notes (Signed)
Subjective: No chest pain, no SOB---was to see Dr. Irish Lack in the office today at 3:15 for the pt stated his legs.  Objective: Vital signs in last 24 hours: Temp:  [97.7 F (36.5 C)-98.5 F (36.9 C)] 97.9 F (36.6 C) (11/16 0400) Pulse Rate:  [57-87] 68 (11/16 0400) Resp:  [18-26] 18 (11/16 0400) BP: (151-176)/(45-59) 164/54 mmHg (11/16 0631) SpO2:  [92 %-98 %] 97 % (11/16 0400) Weight:  [164 lb 11.2 oz (74.707 kg)-168 lb (76.204 kg)] 164 lb 11.2 oz (74.707 kg) (11/16 0400) Weight change:  Last BM Date: 08/31/14 Intake/Output from previous day: 11/15 0701 - 11/16 0700 In: 1450 [P.O.:1450] Out: 2200 [Urine:2200] Intake/Output this shift:    PE: General:Pleasant affect, NAD Skin:Warm and dry, brisk capillary refill HEENT:normocephalic, sclera clear, mucus membranes moist Heart:S1S2 RRR without murmur, gallup, rub or click Lungs: without rales, scattered rhonchi, or wheezes ZHG:DJME, non tender, + BS, do not palpate liver spleen or masses Ext:no lower ext edema, 2+ pedal pulses, 2+ radial pulses Neuro:alert and oriented, MAE, follows commands, + facial symmetry  tele:  SR SVT - during the night  Lab Results:  Recent Labs  08/31/14 0142 09/01/14 0229  WBC 7.8 9.4  HGB 9.1* 9.3*  HCT 28.7* 28.3*  PLT 320 337   BMET  Recent Labs  08/31/14 0142 09/02/14 0304  NA 137 139  K 4.9 4.1  CL 99 99  CO2 25 27  GLUCOSE 224* 93  BUN 38* 40*  CREATININE 1.60* 1.79*  CALCIUM 8.7 9.1    Recent Labs  08/30/14 0942 08/30/14 1715  TROPONINI 2.18* 1.68*    Lab Results  Component Value Date   CHOL 136 03/03/2011   HDL 34* 03/03/2011   LDLCALC  03/03/2011    75        Total Cholesterol/HDL:CHD Risk Coronary Heart Disease Risk Table                     Men   Women  1/2 Average Risk   3.4   3.3  Average Risk       5.0   4.4  2 X Average Risk   9.6   7.1  3 X Average Risk  23.4   11.0        Use the calculated Patient Ratio above and the CHD Risk  Table to determine the patient's CHD Risk.        ATP III CLASSIFICATION (LDL):  <100     mg/dL   Optimal  100-129  mg/dL   Near or Above                    Optimal  130-159  mg/dL   Borderline  160-189  mg/dL   High  >190     mg/dL   Very High   TRIG 134 03/03/2011   CHOLHDL 4.0 03/03/2011   Lab Results  Component Value Date   HGBA1C 7.3* 08/30/2014     Lab Results  Component Value Date   TSH 2.860 08/30/2014      Studies/Results: No results found.  Medications: I have reviewed the patient's current medications. Scheduled Meds: . amLODipine  10 mg Oral Daily  . ampicillin  250 mg Oral 4 times per day  . aspirin EC  325 mg Oral Daily  . atorvastatin  20 mg Oral q1800  . cholecalciferol  1,000 Units Oral Daily  . folic acid  1  mg Oral Daily  . furosemide  40 mg Oral BID  . glipiZIDE  10 mg Oral BID AC  . hydrALAZINE  75 mg Oral TID  . insulin aspart  0-9 Units Subcutaneous 6 times per day  . insulin glargine  10 Units Subcutaneous QHS  . irbesartan  150 mg Oral q morning - 10a  . isosorbide mononitrate  120 mg Oral Daily  . metoprolol  50 mg Oral BID  . multivitamin with minerals  1 tablet Oral Daily  . pantoprazole  40 mg Oral Daily  . predniSONE  10 mg Oral Q breakfast  . sodium chloride  3 mL Intravenous Q12H  . thiamine  100 mg Oral Daily   Continuous Infusions:    Assessment/Plan: Principal Problem:   NSTEMI (non-ST elevated myocardial infarction)- cath 08/30/14 "now probably total occlusion of the old SVG-PDA, the only other potential culprit lesion would be the sequential leg of the SVG-D2-D3.Marland Kitchen Based on the size of the graft distally, and the angle to take through the old graft for a relatively small downstream distribution, this is thought to be best treated medically as it is not that different from previous catheterization." -Dr. Lilian Kapur IV heparin for 48 hours.   Active Problems:   Hyperlipidemia  - none noted since 2012, unless PCP  follows- lipitor at 20 mg. Will need to check as outpt or in AM if he stays until tomorrow.    CAD (coronary artery disease), native coronary artery with Hx of CABG and graft dysfunction.   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 :15 minutes. [facility]  Nurse Practitioner Certified Pager [phone] or after 5pm and on weekends call [phone] 09/02/2014, 7:49 AM   I have examined the patient and reviewed assessment and plan and discussed with patient.  Agree with above as stated.  Claudication i smanageable at this point.  He is not walking that much, for other reasons.  No nonhealing sores on his feet.  No CP now.  He has f/u on 11/20 in our office.    Agree with increasing beta blocker for SVT.   Latiffany Harwick S.

## 2014-09-02 NOTE — Progress Notes (Signed)
Discussed with Dr. Irish Lack. PROGRESS NOTE  [name] JEH:[id number] DOB: 03/06/1941 DOA: 08/29/2014 PCP: Shirline Frees, MD   [employer] is a 73 y.o. male presents with chest pain. He had been discharged yesterday after an admission for congestive heart failure. He was treated with diuretics and responded well so was sent home. He states that today he started having chest pain. Patient states that the pain was located in the center chest and was sharp type of pain. He states that the pain was similar to prior episodes when he has had an MI. 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 During his last stay he was noted to have an EF of 45-50%. Patient states that he was also recently started on prednisone for PMR and has been on 20mg  daily. His sugars were noted to be 498 in the ER.  Troponins turned positive and patient was cathed 11/14   Assessment/Plan: Diabetes Mellitus type 2 uncontrolled Better.  Glipizide Changed to bid. Change lantus to 10. Insulin self teaching. Home likely on lantus -HgbA1C 7.3  CAD/NSTEMI -s/p cath  SVT overnight, beta blocker adjusted.  COPD -nebulizer prn for wheeze and SOB -oxygen as needed  CHF -lasix as per cardiology orders  Hyperlipidemia -continue home medications  UTI Enterococcus species. Change to ampicillin. D/c foley. Self caths PTA  PMR Patient started empirically on pred 20 for jaw claudication. Never had bx. No symptoms now. Decrease to 10 mg, given no definate diagnosis, symptoms, and worsening of DM and CHF.  Anemia Ferritin slightly low, but iron ok. TIBC low. Likely chronic disease  OSA -has not been wearing his CPAP at night -start now  Transfer to tele. Increase activity.  Code Status: full Family Communication: patient/wife Disposition Plan: home 11/17 if stable   Consultants:  cardiology  Procedures:  Cath 11/14    HPI/Subjective: No SOB. No CP. Has not  been oob, has not practiced insulin injection   Objective: Filed Vitals:   09/02/14 1100  BP: 160/70  Pulse: 100  Temp:   Resp:     Intake/Output Summary (Last 24 hours) at 09/02/14 1207 Last data filed at 09/02/14 0600  Gross per 24 hour  Intake    830 ml  Output   1400 ml  Net   -570 ml   Filed Weights   08/30/14 0540 09/01/14 1848 09/02/14 0400  Weight: 77.6 kg (171 lb 1.2 oz) 76.204 kg (168 lb) 74.707 kg (164 lb 11.2 oz)    Exam:   General:  A+Ox3, NAD  Cardiovascular: rrr without MGR  Respiratory: clear without WRR  Abdomen: +BS, soft  Musculoskeletal: no CCE  Data Reviewed: Basic Metabolic Panel:  Recent Labs Lab 08/28/14 0414 08/29/14 1844 08/30/14 0330 08/31/14 0142 09/02/14 0304  NA 142 135* 143 137 139  K 4.1 4.9 3.7 4.9 4.1  CL 102 93* 101 99 99  CO2 29 22 28 25 27   GLUCOSE 148* 493* 121* 224* 93  BUN 42* 45* 41* 38* 40*  CREATININE 1.58* 1.74* 1.51* 1.60* 1.79*  CALCIUM 9.1 9.2 9.1 8.7 9.1   Liver Function Tests:  Recent Labs Lab 08/30/14 0330  AST 23  ALT 37  ALKPHOS 69  BILITOT <0.2*  PROT 6.2  ALBUMIN 2.8*   No results for input(s): LIPASE, AMYLASE in the last 168 hours. No results for input(s): AMMONIA in the last 168 hours. CBC:  Recent Labs Lab 08/29/14 1844 08/30/14 0330 08/30/14  1237 08/31/14 0142 09/01/14 0229  WBC 10.3 9.3 9.1 7.8 9.4  HGB 10.1* 8.8* 8.9* 9.1* 9.3*  HCT 31.2* 27.2* 27.8* 28.7* 28.3*  MCV 73.8* 73.5* 73.9* 76.5* 75.7*  PLT 385 349 358 320 337   Cardiac Enzymes:  Recent Labs Lab 08/29/14 2129 08/30/14 0330 08/30/14 0942 08/30/14 1715  TROPONINI 0.47* 2.15* 2.18* 1.68*   BNP (last 3 results)  Recent Labs  02/25/14 2302 08/26/14 0056 08/27/14 0700  PROBNP 182.4* 5264.0* 2854.0*   CBG:  Recent Labs Lab 09/01/14 1607 09/01/14 1958 09/01/14 2345 09/02/14 0416 09/02/14 0732  GLUCAP 419* 288* 189* 74 128*    Recent Results (from the past 240 hour(s))  Urine culture      Status: None   Collection Time: 08/26/14  1:52 AM  Result Value Ref Range Status   Specimen Description URINE, CATHETERIZED  Final   Special Requests ADDED 0316  Final   Culture  Setup Time   Final    08/26/2014 08:20 Performed at [place]   Final    30,000 COLONIES/ML Performed at [facility]    Culture   Final    Multiple bacterial morphotypes present, none predominant. Suggest appropriate recollection if clinically indicated. Performed at [facility]    Report Status 08/27/2014 FINAL  Final  Urine culture     Status: None   Collection Time: 08/29/14  8:52 PM  Result Value Ref Range Status   Specimen Description URINE, CLEAN CATCH  Final   Special Requests NONE  Final   Culture  Setup Time   Final    08/30/2014 09:09 Performed at [place]   Final    >=100,000 COLONIES/ML Performed at [facility]    Culture   Final    ENTEROCOCCUS SPECIES Performed at [facility]    Report Status 09/01/2014 FINAL  Final   Organism ID, Bacteria ENTEROCOCCUS SPECIES  Final      Susceptibility   Enterococcus species - MIC*    AMPICILLIN <=2 SENSITIVE Sensitive     LEVOFLOXACIN 1 SENSITIVE Sensitive     NITROFURANTOIN <=16 SENSITIVE Sensitive     VANCOMYCIN 2 SENSITIVE Sensitive     TETRACYCLINE >=16 RESISTANT Resistant     * ENTEROCOCCUS SPECIES  MRSA PCR Screening     Status: None   Collection Time: 08/30/14  5:36 AM  Result Value Ref Range Status   MRSA by PCR NEGATIVE NEGATIVE Final    Comment:        The GeneXpert MRSA Assay (FDA approved for NASAL specimens only), is one component of a comprehensive MRSA colonization surveillance program. It is not intended to diagnose MRSA infection nor to guide or monitor treatment for MRSA infections.      Studies: No results found.  Scheduled Meds: . amLODipine  10 mg Oral Daily  . ampicillin  250 mg Oral 4 times per day  . aspirin EC   325 mg Oral Daily  . atorvastatin  20 mg Oral q1800  . cholecalciferol  1,000 Units Oral Daily  . folic acid  1 mg Oral Daily  . furosemide  40 mg Oral BID  . glipiZIDE  10 mg Oral BID AC  . hydrALAZINE  75 mg Oral TID  . insulin aspart  0-9 Units Subcutaneous 6 times per day  . insulin glargine  10 Units Subcutaneous QHS  . irbesartan  150 mg Oral q morning -  10a  . isosorbide mononitrate  120 mg Oral Daily  . metoprolol  75 mg Oral BID  . multivitamin with minerals  1 tablet Oral Daily  . pantoprazole  40 mg Oral Daily  . predniSONE  10 mg Oral Q breakfast  . sodium chloride  3 mL Intravenous Q12H  . thiamine  100 mg Oral Daily   Continuous Infusions:   Antibiotics Given (last 72 hours)    Date/Time Action Medication Dose   09/01/14 1729 Given   ampicillin (PRINCIPEN) capsule 250 mg 250 mg   09/01/14 2346 Given   ampicillin (PRINCIPEN) capsule 250 mg 250 mg   09/02/14 0631 Given   ampicillin (PRINCIPEN) capsule 250 mg 250 mg     Time spent: 25 min  [facility] Pager [phone]. If 7PM-7AM, please contact night-coverage at [email or web address], password [facility] 09/02/2014, 12:07 PM  LOS: 4 days

## 2014-09-02 NOTE — Progress Notes (Signed)
Inpatient Diabetes Program Recommendations  AACE/ADA: New Consensus Statement on Inpatient Glycemic Control (2013)  Target Ranges:  Prepandial:   less than 140 mg/dL      Peak postprandial:   less than 180 mg/dL (1-2 hours)      Critically ill patients:  140 - 180 mg/dL     Results for [name], [name] (MRN [id number]) as of 09/02/2014 12:29  Ref. Range 09/02/2014 07:32 09/02/2014 12:12  Glucose-Capillary Latest Range: 70-99 mg/dL 128 (H) 165 (H)     Spoke with Dr. Conley Canal today.  Note plans to stop Metformin for home and start Lantus once daily at home along with patient's previous dose of Glipizide.  Spoke with patient and his wife today about plan to stop Metformin and start Lantus.  Discussed with patient and wife what Lantus is, how it works, how to take, Social research officer, government.  Encouraged patient to contact his PCP after d/c to let his PCP know about these DM medication changes.  Also encouraged patient to check his CBGs tid at home (either before meals or two hours after meals) and to alert his PCP if he has frequent hyperglycemia or frequent hypoglcyemia.  Reviewed CBG goals with patient as well and also discussed s/sxs of hypoglycemia and how to treat hypoglycemia at home.  Educated patient and spouse on insulin pen use at home.  Reviewed contents of insulin flexpen starter kit.  Reviewed all steps if insulin pen including attachment of needle, 2-unit air shot, dialing up dose, giving injection, removing needle, disposal of sharps, storage of unused insulin, disposal of insulin etc.  Patient able to provide successful return demonstration.  Also reviewed troubleshooting with insulin pen.  MD to give patient Rxs for insulin pens and insulin pen needles.   MD- Please provide patient with the following Rxs at time of discharge:  1. Lantus solostar pen [Order # [id number] 2. Insulin pen needles 31 gauge x 52m [Order ##[id number]]   Will follow JWyn QuakerRN, MSN, CDE Diabetes  Coordinator Inpatient Diabetes Program Team Pager: [phone](8a-10p)

## 2014-09-03 ENCOUNTER — Encounter (HOSPITAL_COMMUNITY): Payer: Self-pay | Admitting: Interventional Cardiology

## 2014-09-03 DIAGNOSIS — I471 Supraventricular tachycardia: Secondary | ICD-10-CM

## 2014-09-03 LAB — BASIC METABOLIC PANEL
Anion gap: 13 (ref 5–15)
BUN: 41 mg/dL — AB (ref 6–23)
CO2: 29 mEq/L (ref 19–32)
Calcium: 9.2 mg/dL (ref 8.4–10.5)
Chloride: 99 mEq/L (ref 96–112)
Creatinine, Ser: 1.76 mg/dL — ABNORMAL HIGH (ref 0.50–1.35)
GFR calc Af Amer: 43 mL/min — ABNORMAL LOW (ref >90)
GFR, EST NON AFRICAN AMERICAN: 37 mL/min — AB (ref >90)
GLUCOSE: 65 mg/dL — AB (ref 70–99)
Potassium: 3.9 mEq/L (ref 3.7–5.3)
Sodium: 141 mEq/L (ref 137–147)

## 2014-09-03 LAB — LIPID PANEL
Cholesterol: 133 mg/dL (ref 0–200)
HDL: 41 mg/dL (ref >39)
LDL Cholesterol: 64 mg/dL (ref 0–99)
TRIGLYCERIDES: 138 mg/dL (ref <150)
Total CHOL/HDL Ratio: 3.2 RATIO
VLDL: 28 mg/dL (ref 0–40)

## 2014-09-03 LAB — GLUCOSE, CAPILLARY: GLUCOSE-CAPILLARY: 98 mg/dL (ref 70–99)

## 2014-09-03 MED ORDER — INSULIN GLARGINE 100 UNIT/ML SOLOSTAR PEN: 10.0000 [IU] | PEN_INJECTOR | SUBCUTANEOUS | Status: DC

## 2014-09-03 MED ORDER — AMPICILLIN 500 MG PO CAPS: 500.0000 mg | ORAL_CAPSULE | Freq: Two times a day (BID) | ORAL | Status: DC

## 2014-09-03 MED ORDER — FUROSEMIDE 40 MG PO TABS: 40.0000 mg | ORAL_TABLET | Freq: Two times a day (BID) | ORAL | Status: DC

## 2014-09-03 MED ORDER — IRBESARTAN 300 MG PO TABS
150.0000 mg | ORAL_TABLET | Freq: Every day | ORAL | Status: DC
Start: 2014-09-03 — End: 2014-10-18

## 2014-09-03 MED ORDER — GLIPIZIDE 10 MG PO TABS: 10.0000 mg | ORAL_TABLET | Freq: Two times a day (BID) | ORAL | Status: DC

## 2014-09-03 MED ORDER — PREDNISONE 20 MG PO TABS: 10.0000 mg | ORAL_TABLET | Freq: Every day | ORAL | Status: DC

## 2014-09-03 MED ORDER — HYDRALAZINE HCL 50 MG PO TABS: 75.0000 mg | ORAL_TABLET | Freq: Three times a day (TID) | ORAL | Status: DC

## 2014-09-03 MED ORDER — INSULIN GLARGINE 100 UNIT/ML ~~LOC~~ SOLN
5.0000 [IU] | Freq: Every day | SUBCUTANEOUS | Status: DC
  Filled 2014-09-03: qty 0.05

## 2014-09-03 MED ORDER — INSULIN PEN NEEDLE 31G X 5 MM MISC: Status: DC

## 2014-09-03 MED ORDER — METOPROLOL TARTRATE 50 MG PO TABS: 75.0000 mg | ORAL_TABLET | Freq: Two times a day (BID) | ORAL | Status: DC

## 2014-09-03 NOTE — Progress Notes (Signed)
Spoke with patient at bedside regarding [facility] services. He expressed going home on a new insulin regimen. Patient's wife was present and explained services and consent were obtained. Patient will receive post hospital discharge calls and will be evaluated for home visits.  Of note, [facility] Care Management services does not replace or interfere with any services that are arranged by inpatient case management. Inpatient RNCM made aware of [facility] following.  For additional questions or referrals,  please contact Natividad Brood, RN BSN [facility] Liaison at [phone].

## 2014-09-03 NOTE — Progress Notes (Signed)
Patient Name: [name] Date of Encounter: 09/03/2014     Principal Problem:   NSTEMI (non-ST elevated myocardial infarction) Active Problems:   Hyperlipidemia   CAD (coronary artery disease), native coronary artery   COPD (chronic obstructive pulmonary disease)   Type 2 diabetes mellitus with vascular disease   CKD (chronic kidney disease) stage 3, GFR 30-59 ml/min   Chronic combined systolic and diastolic congestive heart failure   Diabetes type 2, uncontrolled   Polymyalgia rheumatica   Anemia   Hyperglycemia   Pain in the chest    SUBJECTIVE  Feeling well. Ready to go home.   CURRENT MEDS . amLODipine  10 mg Oral Daily  . ampicillin  250 mg Oral 4 times per day  . aspirin EC  325 mg Oral Daily  . atorvastatin  20 mg Oral q1800  . cholecalciferol  1,000 Units Oral Daily  . enoxaparin (LOVENOX) injection  30 mg Subcutaneous Q24H  . folic acid  1 mg Oral Daily  . furosemide  40 mg Oral BID  . hydrALAZINE  75 mg Oral TID  . insulin aspart  0-9 Units Subcutaneous TID WC  . insulin glargine  5 Units Subcutaneous QHS  . irbesartan  150 mg Oral q morning - 10a  . isosorbide mononitrate  120 mg Oral Daily  . metoprolol  75 mg Oral BID  . multivitamin with minerals  1 tablet Oral Daily  . pantoprazole  40 mg Oral Daily  . predniSONE  10 mg Oral Q breakfast  . sodium chloride  3 mL Intravenous Q12H  . thiamine  100 mg Oral Daily    OBJECTIVE  Filed Vitals:   09/02/14 1100 09/02/14 1359 09/02/14 2100 09/03/14 0500  BP: 160/70 141/51 143/48 160/50  Pulse: 100 64 62 60  Temp:  98.9 F (37.2 C) 98.1 F (36.7 C) 98.7 F (37.1 C)  TempSrc:  Oral    Resp:  16 15 16   Height:      Weight:    167 lb 12.8 oz (76.114 kg)  SpO2:  95% 99% 96%    Intake/Output Summary (Last 24 hours) at 09/03/14 0940 Last data filed at 09/03/14 0900  Gross per 24 hour  Intake   2860 ml  Output   1475 ml  Net   1385 ml   Filed Weights   09/01/14 1848 09/02/14 0400 09/03/14  0500  Weight: 168 lb (76.204 kg) 164 lb 11.2 oz (74.707 kg) 167 lb 12.8 oz (76.114 kg)    PHYSICAL EXAM  General: Pleasant, NAD. Neuro: Alert and oriented X 3. Moves all extremities spontaneously. Psych: Normal affect. HEENT:  Normal  Neck: Supple without bruits or JVD. Lungs:  Resp regular and unlabored, CTA. Heart: RRR no s3, s4, or murmurs. Abdomen: Soft, non-tender, non-distended, BS + x 4.  Extremities: No clubbing, cyanosis or edema. DP/PT/Radials 2+ and equal bilaterally.  Accessory Clinical Findings  CBC  Recent Labs  09/01/14 0229  WBC 9.4  HGB 9.3*  HCT 28.3*  MCV 75.7*  PLT 161   Basic Metabolic Panel  Recent Labs  09/02/14 0304 09/02/14 1320 09/03/14 0403  NA 139  --  141  K 4.1  --  3.9  CL 99  --  99  CO2 27  --  29  GLUCOSE 93  --  65*  BUN 40*  --  41*  CREATININE 1.79*  --  1.76*  CALCIUM 9.1  --  9.2  MG  --  2.0  --  Fasting Lipid Panel  Recent Labs  09/03/14 0403  CHOL 133  HDL 41  LDLCALC 64  TRIG 138  CHOLHDL 3.2    TELE  Some sinus brady overnight  Radiology/Studies  Dg Chest 2 View  08/26/2014   CLINICAL DATA:  Dyspnea for 4 hr, worse with lying down.  EXAM: CHEST  2 VIEW  COMPARISON:  PA and lateral chest 04/03/2014. Single view of the chest 03/02/2014. CT chest 03/20/2013.  FINDINGS: The patient is status post CABG. Heart size is normal. No pneumothorax or pleural effusion is identified. Kerley B-lines in the lower chest are noted. No focal airspace disease is seen.  IMPRESSION: Kerley B-lines in the lower chest most in keeping with mild interstitial edema.   Electronically Signed   By: Inge Rise M.D.   On: 08/26/2014 01:39   Dg Chest Port 1 View  08/29/2014   CLINICAL DATA:  Left-sided chest pain today.  EXAM: PORTABLE CHEST - 1 VIEW  COMPARISON:  08/26/2014  FINDINGS: Lungs are adequately inflated without consolidation or effusion. Borderline cardiomegaly unchanged. Remainder of the exam is unchanged.   IMPRESSION: No acute cardiopulmonary disease.  Borderline cardiomegaly.   Electronically Signed   By: Marin Olp M.D.   On: 08/29/2014 19:18    ASSESSMENT AND PLAN  NSTEMI (non-ST elevated myocardial infarction)- cath 08/30/14 "now probably total occlusion of the old SVG-PDA, the only other potential culprit lesion would be the sequential leg of the SVG-D2-D3.Marland Kitchen Based on the size of the graft distally, and the angle to take through the old graft for a relatively small downstream distribution, this is thought to be best treated medically as it is not that different from previous catheterization." -Dr. Lilian Kapur IV heparin for 48 hours.   Hyperlipidemia -continue statin   HTN- continue metoprolol 75mg  BID  CKD (chronic kidney disease) stage 3, GFR 30-59 ml/min. Creat stable at 1.76  Chronic combined systolic and diastolic congestive heart failure- on Lasix  Diabetes type 2, uncontrolled  -- Per IM  Polymyalgia rheumatica- followed by IM  SVT during the night- noted on tele, BB increased to 75 mg BID. No recurrence.   Dispo- stable for discharge home from a cardiac standpoint.   SignedEileen Stanford PA-C  Pager [phone]  I have examined the patient and reviewed assessment and plan and discussed with patient.  Agree with above as stated.  Tele reviewed.  Rhythm stable.  Plan for close OP f/u with appt on Friday already scheduled.    Raye Wiens S.

## 2014-09-03 NOTE — Discharge Summary (Signed)
Physician Discharge Summary  [name] MCN:[id number] DOB: 1941-04-23 DOA: 08/29/2014  PCP: Shirline Frees, MD  Admit date: 08/29/2014 Discharge date: 09/03/2014  Time spent: greater than 30 minutes  Recommendations for Outpatient Follow-up:  1.   Discharge Diagnoses:  Principal Problem:   NSTEMI (non-ST elevated myocardial infarction) Active Problems:   Hyperlipidemia   CAD (coronary artery disease), native coronary artery   COPD (chronic obstructive pulmonary disease)   Type 2 diabetes mellitus with vascular disease   CKD (chronic kidney disease) stage 3, GFR 30-59 ml/min   Chronic combined systolic and diastolic congestive heart failure   Diabetes type 2, uncontrolled   Polymyalgia rheumatica   Anemia   Hyperglycemia   Pain in the chest   SVT (supraventricular tachycardia)   Discharge Condition: stable  Filed Weights   09/01/14 1848 09/02/14 0400 09/03/14 0500  Weight: 76.204 kg (168 lb) 74.707 kg (164 lb 11.2 oz) 76.114 kg (167 lb 12.8 oz)    History of present illness:  73 y.o. male presents with chest pain. He had been discharged yesterday after an admission for congestive heart failure. He was treated with diuretics and responded well so was sent home. He states that today he started having chest pain. Patient states that the pain was located in the center chest and was sharp type of pain. He states that the pain was similar to prior episodes when he has had an MI. 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 During his last stay he was noted to have an EF of 45-50%. Patient states that he was also recently started on prednisone for PMR and has been on 20mg  daily. His sugars were noted to be 498 here in the ED. Patient is being admitted for better control of his sugars. In addition he has a UTI.   Assessment/Plan:  Admitted to stepdown.  Ruled in for NSTEMI.  Cardiology consulted.   CAD/NSTEMI  Started on heparin gtt,  nitro gtt. Asa, statin, beta blocker continued. Cardiac catheterization showed Relatively stable native and graft findings with likely final occlusion of the SVG-PDA from the original surgery. mild progression of disease in the sequential limb from SVG-D2-D3, but not amenable to PCSevere native CAD with near occlusion of the left main with only a small circumflex residual and an LAD occlusion after septal perforators. The RCA is also known to be occluded.  Severely elevated LVEDP of 30 mmHg with systemic hypertension. Potential etiology for positive troponin could simply be elevated LVEDP in the setting of microvascular and some otherwise small vessel disease.  Chronic combined systolic and diastolic CHF Lasix and ARB continued. euvolemic  Diabetes Mellitus type 2 uncontrolled Poor control related to newly started prednisone. Was started on 20 mg prednisone by PCP for PMR. Weaned to 10 mg without significant return of symptoms. Has appointment with rheumatology next week.  Metformin stopped due to creatinine above 1.5. Glucotrol changed from 10 mg daily to bid.  Started on once daily lantus which will be continued at home.  Paroxysmal SVT developed. Metoprolol increased without return of SVG  COPD Remained stable  Hyperlipidemia On statin  UTI Enterococcus species. Antibiotics changed to ampicillin. 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 Patient started empirically on pred 20 for jaw claudication. Never had bx. No symptoms now. Prednisone decreased to 10 mg due to CHF and uncontrolled DM  Anemia Ferritin slightly low, but iron ok. TIBC low. Likely chronic disease  OSA -has not been wearing his CPAP at night -start now  Consultants:  cardiology  Procedures:  Cardiac catheterization 11/14  Discharge Exam: Filed Vitals:   09/03/14 0500  BP: 160/50  Pulse: 60  Temp: 98.7 F (37.1 C)  Resp: 16    General: comfortable Cardiovascular:  RRR Lungs:  CTA Abd: s, nt, nd Ext no CCE   Discharge Instructions    Diet - low sodium heart healthy    Complete by:  As directed      Diet Carb Modified    Complete by:  As directed      Discharge instructions    Complete by:  As directed   CHANGE LASIX TO 40 MG TWICE DAILY. CHANGE GLIPIZIDE TO 10 MG TWICE DAILY.CHANGE HYDRALAZINE TO 75 MG 3 TIMES DAILY (1.5 TABLET).  CHANGE METOPROLOL TO 75 MG TWICE DAILY (1.5 TABLET).     Increase activity slowly    Complete by:  As directed           Current Discharge Medication List    START taking these medications   Details  ampicillin (PRINCIPEN) 500 MG capsule Take 1 capsule (500 mg total) by mouth 2 (two) times daily. Until gone Qty: 10 capsule, Refills: 0    Insulin Glargine (LANTUS) 100 UNIT/ML Solostar Pen Inject 10 Units into the skin every morning. Qty: 15 mL, Refills: 1    Insulin Pen Needle (FIFTY50 PEN NEEDLES) 31G X 5 MM MISC As directed Qty: 30 each, Refills: 1      CONTINUE these medications which have CHANGED   Details  furosemide (LASIX) 40 MG tablet Take 1 tablet (40 mg total) by mouth 2 (two) times daily. Qty: 60 tablet, Refills: 0    glipiZIDE (GLUCOTROL) 10 MG tablet Take 1 tablet (10 mg total) by mouth 2 (two) times daily before a meal. Qty: 60 tablet, Refills: 0    hydrALAZINE (APRESOLINE) 50 MG tablet Take 1.5 tablets (75 mg total) by mouth 3 (three) times daily. Qty: 140 tablet, Refills: 0    irbesartan (AVAPRO) 300 MG tablet Take 0.5 tablets (150 mg total) by mouth daily.    metoprolol (LOPRESSOR) 50 MG tablet Take 1.5 tablets (75 mg total) by mouth 2 (two) times daily. Qty: 90 tablet, Refills: 0    predniSONE (DELTASONE) 20 MG tablet Take 0.5 tablets (10 mg total) by mouth daily with breakfast. Take for 14 days      CONTINUE these medications which have NOT CHANGED   Details  acetaminophen (TYLENOL) 325 MG tablet Take 650 mg by mouth every 6 (six) hours as needed for moderate pain.    amLODipine  (NORVASC) 10 MG tablet Take 10 mg by mouth daily.    aspirin EC 325 MG tablet Take 325 mg by mouth daily.    atorvastatin (LIPITOR) 20 MG tablet Take 20 mg by mouth daily at 6 PM.     cholecalciferol (VITAMIN D) 1000 UNITS tablet Take 1,000 Units by mouth daily.    isosorbide mononitrate (IMDUR) 120 MG 24 hr tablet Take 1 tablet (120 mg total) by mouth daily. Qty: 30 tablet, Refills: 5    nitroGLYCERIN (NITROSTAT) 0.4 MG SL tablet Place 1 tablet (0.4 mg total) under the tongue every 5 (five) minutes as needed for chest pain. Qty: 25 tablet, Refills: 3   Associated Diagnoses: S/P CABG x 3; Essential hypertension; Coronary artery disease involving native coronary artery of native heart with angina pectoris    omeprazole (PRILOSEC) 20 MG capsule Take 20  mg by mouth daily.      STOP taking these medications     metFORMIN (GLUCOPHAGE) 1000 MG tablet        No Known Allergies Follow-up Information    Follow up with Shirline Frees, MD.   Specialty:  Family Medicine   Why:  2-4 [place].   Contact information:   [address] [address] [phone]        The results of significant diagnostics from this hospitalization (including imaging, microbiology, ancillary and laboratory) are listed below for reference.    Significant Diagnostic Studies: Dg Chest 2 View  08/26/2014   CLINICAL DATA:  Dyspnea for 4 hr, worse with lying down.  EXAM: CHEST  2 VIEW  COMPARISON:  PA and lateral chest 04/03/2014. Single view of the chest 03/02/2014. CT chest 03/20/2013.  FINDINGS: The patient is status post CABG. Heart size is normal. No pneumothorax or pleural effusion is identified. Kerley B-lines in the lower chest are noted. No focal airspace disease is seen.  IMPRESSION: Kerley B-lines in the lower chest most in keeping with mild interstitial edema.   Electronically Signed   By: Inge Rise M.D.   On: 08/26/2014 01:39   Dg  Chest Port 1 View  08/29/2014   CLINICAL DATA:  Left-sided chest pain today.  EXAM: PORTABLE CHEST - 1 VIEW  COMPARISON:  08/26/2014  FINDINGS: Lungs are adequately inflated without consolidation or effusion. Borderline cardiomegaly unchanged. Remainder of the exam is unchanged.  IMPRESSION: No acute cardiopulmonary disease.  Borderline cardiomegaly.   Electronically Signed   By: Marin Olp M.D.   On: 08/29/2014 19:18   EKG NSR with RBBB  Microbiology: Recent Results (from the past 240 hour(s))  Urine culture     Status: None   Collection Time: 08/26/14  1:52 AM  Result Value Ref Range Status   Specimen Description URINE, CATHETERIZED  Final   Special Requests ADDED 0316  Final   Culture  Setup Time   Final    08/26/2014 08:20 Performed at [facility]   Final    30,000 COLONIES/ML Performed at [facility]    Culture   Final    Multiple bacterial morphotypes present, none predominant. Suggest appropriate recollection if clinically indicated. Performed at [facility]    Report Status 08/27/2014 FINAL  Final  Urine culture     Status: None   Collection Time: 08/29/14  8:52 PM  Result Value Ref Range Status   Specimen Description URINE, CLEAN CATCH  Final   Special Requests NONE  Final   Culture  Setup Time   Final    08/30/2014 09:09 Performed at [facility]   Final    >=100,000 COLONIES/ML Performed at [facility]    Culture   Final    ENTEROCOCCUS SPECIES Performed at [facility]    Report Status 09/01/2014 FINAL  Final   Organism ID, Bacteria ENTEROCOCCUS SPECIES  Final      Susceptibility   Enterococcus species - MIC*    AMPICILLIN <=2 SENSITIVE Sensitive     LEVOFLOXACIN 1 SENSITIVE Sensitive     NITROFURANTOIN <=16 SENSITIVE Sensitive     VANCOMYCIN 2 SENSITIVE Sensitive     TETRACYCLINE >=16 RESISTANT Resistant     * ENTEROCOCCUS SPECIES  MRSA PCR Screening     Status:  None   Collection Time:  08/30/14  5:36 AM  Result Value Ref Range Status   MRSA by PCR NEGATIVE NEGATIVE Final    Comment:        The GeneXpert MRSA Assay (FDA approved for NASAL specimens only), is one component of a comprehensive MRSA colonization surveillance program. It is not intended to diagnose MRSA infection nor to guide or monitor treatment for MRSA infections.      Labs: Basic Metabolic Panel:  Recent Labs Lab 08/29/14 1844 08/30/14 0330 08/31/14 0142 09/02/14 0304 09/02/14 1320 09/03/14 0403  NA 135* 143 137 139  --  141  K 4.9 3.7 4.9 4.1  --  3.9  CL 93* 101 99 99  --  99  CO2 22 28 25 27   --  29  GLUCOSE 493* 121* 224* 93  --  65*  BUN 45* 41* 38* 40*  --  41*  CREATININE 1.74* 1.51* 1.60* 1.79*  --  1.76*  CALCIUM 9.2 9.1 8.7 9.1  --  9.2  MG  --   --   --   --  2.0  --    Liver Function Tests:  Recent Labs Lab 08/30/14 0330  AST 23  ALT 37  ALKPHOS 69  BILITOT <0.2*  PROT 6.2  ALBUMIN 2.8*   No results for input(s): LIPASE, AMYLASE in the last 168 hours. No results for input(s): AMMONIA in the last 168 hours. CBC:  Recent Labs Lab 08/29/14 1844 08/30/14 0330 08/30/14 1237 08/31/14 0142 09/01/14 0229  WBC 10.3 9.3 9.1 7.8 9.4  HGB 10.1* 8.8* 8.9* 9.1* 9.3*  HCT 31.2* 27.2* 27.8* 28.7* 28.3*  MCV 73.8* 73.5* 73.9* 76.5* 75.7*  PLT 385 349 358 320 337   Cardiac Enzymes:  Recent Labs Lab 08/29/14 2129 08/30/14 0330 08/30/14 0942 08/30/14 1715  TROPONINI 0.47* 2.15* 2.18* 1.68*   BNP: BNP (last 3 results)  Recent Labs  02/25/14 2302 08/26/14 0056 08/27/14 0700  PROBNP 182.4* 5264.0* 2854.0*   CBG:  Recent Labs Lab 09/02/14 0732 09/02/14 1212 09/02/14 1630 09/02/14 2025 09/03/14 0746  GLUCAP 128* 165* 292* 247* 98       Signed:  [place]  [facility] 09/03/2014, 10:58 AM

## 2014-09-03 NOTE — Progress Notes (Signed)
Inpatient Diabetes Program Recommendations  AACE/ADA: New Consensus Statement on Inpatient Glycemic Control (2013)  Target Ranges:  Prepandial:   less than 140 mg/dL      Peak postprandial:   less than 180 mg/dL (1-2 hours)      Critically ill patients:  140 - 180 mg/dL   Reason for Visit: review of insulin pen teaching  Patient requested review of insulin pen teaching.  He had had teaching yesterday but wanted a review in order to be safe when he is discharged home. Wife in attendance. Return demonstration revealed perfect insulin pen technique. Reviewed site selection, storage and expiry of insulin pens.  Reviewed hypoglycemia treatment and the prevention of low blood sugars. Patient is able to articulate treatment of low blood sugars. Reviewed the teaching in the [employer] insulin teaching kit, and the need to carry glucose tabs and peanut butter crackers at all time. Encouraged patient to check blood sugars 1-2x/day and to begin exercising if MD approves. To bring blood sugar records to all MD visits. Patient and wife express gratitude for review of diabetes pen teaching and state they are more comfortable/confident with the pen now.   Gentry Fitz, RN, BA, MHA, CDE Diabetes Coordinator Inpatient Diabetes Program  [phone] (Team Pager) [phone] [facility]) 09/03/2014 10:42 AM

## 2014-09-03 NOTE — Plan of Care (Signed)
Problem: Phase III Progression Outcomes Goal: No anginal pain Outcome: Completed/Met Date Met:  09/03/14

## 2014-09-06 ENCOUNTER — Encounter: Payer: Self-pay | Admitting: Physician Assistant

## 2014-09-06 ENCOUNTER — Telehealth: Payer: Self-pay | Admitting: *Deleted

## 2014-09-06 ENCOUNTER — Ambulatory Visit (INDEPENDENT_AMBULATORY_CARE_PROVIDER_SITE_OTHER): Payer: Medicare Other | Admitting: Physician Assistant

## 2014-09-06 VITALS — BP 140/52 | HR 56 | Ht 66.0 in | Wt 165.6 lb

## 2014-09-06 DIAGNOSIS — N183 Chronic kidney disease, stage 3 unspecified: Secondary | ICD-10-CM

## 2014-09-06 DIAGNOSIS — D649 Anemia, unspecified: Secondary | ICD-10-CM | POA: Diagnosis not present

## 2014-09-06 DIAGNOSIS — I5042 Chronic combined systolic (congestive) and diastolic (congestive) heart failure: Secondary | ICD-10-CM

## 2014-09-06 DIAGNOSIS — I222 Subsequent non-ST elevation (NSTEMI) myocardial infarction: Secondary | ICD-10-CM

## 2014-09-06 DIAGNOSIS — I10 Essential (primary) hypertension: Secondary | ICD-10-CM

## 2014-09-06 DIAGNOSIS — I25118 Atherosclerotic heart disease of native coronary artery with other forms of angina pectoris: Secondary | ICD-10-CM | POA: Diagnosis not present

## 2014-09-06 DIAGNOSIS — E785 Hyperlipidemia, unspecified: Secondary | ICD-10-CM | POA: Diagnosis not present

## 2014-09-06 DIAGNOSIS — I214 Non-ST elevation (NSTEMI) myocardial infarction: Secondary | ICD-10-CM

## 2014-09-06 DIAGNOSIS — M353 Polymyalgia rheumatica: Secondary | ICD-10-CM

## 2014-09-06 DIAGNOSIS — I5032 Chronic diastolic (congestive) heart failure: Secondary | ICD-10-CM

## 2014-09-06 LAB — CBC WITH DIFFERENTIAL/PLATELET
BASOS PCT: 0.2 % (ref 0.0–3.0)
Basophils Absolute: 0 10*3/uL (ref 0.0–0.1)
Eosinophils Absolute: 0.1 10*3/uL (ref 0.0–0.7)
Eosinophils Relative: 0.8 % (ref 0.0–5.0)
HCT: 33.9 % — ABNORMAL LOW (ref 39.0–52.0)
Hemoglobin: 10.9 g/dL — ABNORMAL LOW (ref 13.0–17.0)
Lymphocytes Relative: 8.5 % — ABNORMAL LOW (ref 12.0–46.0)
Lymphs Abs: 1 10*3/uL (ref 0.7–4.0)
MCHC: 32.1 g/dL (ref 30.0–36.0)
MCV: 74.7 fl — AB (ref 78.0–100.0)
MONO ABS: 0.5 10*3/uL (ref 0.1–1.0)
Monocytes Relative: 4.9 % (ref 3.0–12.0)
NEUTROS ABS: 9.7 10*3/uL — AB (ref 1.4–7.7)
Platelets: 333 10*3/uL (ref 150.0–400.0)
RBC: 4.54 Mil/uL (ref 4.22–5.81)
RDW: 17.4 % — ABNORMAL HIGH (ref 11.5–15.5)
WBC: 11.3 10*3/uL — ABNORMAL HIGH (ref 4.0–10.5)

## 2014-09-06 LAB — BASIC METABOLIC PANEL
BUN: 65 mg/dL — AB (ref 6–23)
CHLORIDE: 97 meq/L (ref 96–112)
CO2: 27 mEq/L (ref 19–32)
Calcium: 9.1 mg/dL (ref 8.4–10.5)
Creatinine, Ser: 2.3 mg/dL — ABNORMAL HIGH (ref 0.4–1.5)
GFR: 29.78 mL/min — ABNORMAL LOW (ref >60.00)
Glucose, Bld: 245 mg/dL — ABNORMAL HIGH (ref 70–99)
Potassium: 4.9 mEq/L (ref 3.5–5.1)
Sodium: 134 mEq/L — ABNORMAL LOW (ref 135–145)

## 2014-09-06 NOTE — Progress Notes (Signed)
[facility], [place], [place]  [address] Phone: [phone] Fax:  [phone]  Date:  09/06/2014   Patient ID:  [name], DOB [birth year], MRN [id number]   PCP:  Shirline Frees, MD  Cardiologist:  Dr. Tamala Julian   History of Present Illness: [name] is a 73 y.o. male with PMH of CAD (CABG in 1999, repeat CABG in 2015 with SVG to D1, SVG to PDA, and SVG to OM), HLD, OSA, CHF prior EF 45-50%, grade II DD, T2DM, COPD, OSA, stage III-IV CKD, polymyalgia rheumatica, PVD including carotid stenosis, anemia who presents for post-hospital follow-up of NSTEMI. During that admission he also had CBG of 498 in setting of recently starting prednisone for PMR. He underwent cath showing "now probably total occlusion of the old SVG-PDA, the only other potential culprit lesion would be the sequential leg of the SVG-D2-D3.Marland Kitchen Based on the size of the graft distally, and the angle to take through the old graft for a relatively small downstream distribution, this is thought to be best treated medically as it is not that different from previous catheterization." Report also mentions "Severely elevated LVEDP of 30 mmHg with systemic hypertension. Potential etiology for positive troponin could simply be elevated LVEDP in the setting of microvascular and some otherwise small vessel disease." IV heparin was continued for 48 hours. He was also noted to have SVT on telemetry and beta blocker was increased. 2D echo 08/26/14: EF 45-50%, akinesis inferiorly, grade 2 d/d, mild MR, no sig change from prior. LDL 64.  Since discharge, he reports doing well - "MUCH better."  He has not had any chest pain, shortness of breath, LE edema, orthopnea, or PND. He reports compliance with medications.  He does have some nocturnal leg cramping that he is using an OTC ointment for.  He has been following a heart healthy diet and is not adding salt.  He is walking on a treadmill daily but exercise is limited to 3-4  minutes at a time due to claudication (last ABI's 02/2014 with severe arterial insufficiency). Systolic blood pressure at home is running 120-130's.  He is weighing himself daily and is losing on average a pound day, but reports that his diet is significantly improved since discharge. He does have occasional blood tinged urine (self-cath's daily for the last 2 years) and hematuria is chronic for him, followed by urology.  No BRBPR or hemoptysis, but he did notice prior to recent admission that he had had melena - this has resolved. Last Hgb 9.3 09/01/2014 with positive FOBT during admission. Last colonoscopy 2014 reportedly with polyp removed but otherwise normal.   Fasting blood sugars have been running around 100, but patient states diabetes status is much more labile following initiation of prednisone for PMR.  He has follow up with Dr [name] (PCP) in early December.   Recent Labs: 08/27/2014: Pro B Natriuretic peptide (BNP) 2854.0* 08/30/2014: ALT 37; TSH 2.860 09/01/2014: Hemoglobin 9.3* 09/03/2014: Creatinine 1.76*; HDL Cholesterol by NMR 41; LDL (calc) 64; Potassium 3.9  Wt Readings from Last 3 Encounters:  09/06/14 165 lb 9.6 oz (75.116 kg)  09/03/14 167 lb 12.8 oz (76.114 kg)  08/28/14 162 lb 0.6 oz (73.5 kg)     Past Medical History  Diagnosis Date  . Carotid artery occlusion     Bilateral 50-69% stenoses of the internal carotid arteries. He needs carotid Dopplers every 6 months.  . Hyperlipidemia   . Atherosclerosis of native arteries of the extremities  with intermittent claudication Left external iliac artery stent in 2001    Bilateral SFA occlusions documented in 2001.  Marland Kitchen Hypertension   . GERD (gastroesophageal reflux disease)   . Esophageal stricture 09/24/2013    History of dilatation. Long-standing history of esophageal reflux   . 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  1999 Repeat CABG 2015 with SVG to D1, SVG to PDA, and SVG to OM.   Marland Kitchen Chronic diastolic heart failure 03/19/2296  . CAD (coronary artery disease), native coronary artery 09/24/2013    Native circumflex Taxus stent 2007, angioplasty of the SVG to native right coronary 2006, DES stent to the SVG of RCA 2007 and resplinted 2009  CABG in 1999. Stent in SVG to RCA in 9892 complicated by no reflow   . Chronic combined systolic and diastolic CHF (congestive heart failure)   . MI (myocardial infarction) 1999-02/2014    "I've had 6"  . OSA (obstructive sleep apnea)     "don't wear mask" (08/26/2014)  . Type II diabetes mellitus   . Anemia   . History of hiatal hernia   . Daily headache     "lately" (08/26/2014)  . Arthritis     "qwhere"  . Chronic lower back pain   . CKD (chronic kidney disease) stage 3, GFR 30-59 ml/min   . Skin cancer   . 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 Polymyalgia rheumatica   . SVT (supraventricular tachycardia)   . COPD (chronic obstructive pulmonary disease)     Current Outpatient Prescriptions  Medication Sig Dispense Refill  . acetaminophen (TYLENOL) 325 MG tablet Take 650 mg by mouth every 6 (six) hours as needed for moderate pain.    Marland Kitchen amLODipine (NORVASC) 10 MG tablet Take 10 mg by mouth daily.    Marland Kitchen ampicillin (PRINCIPEN) 500 MG capsule Take 1 capsule (500 mg total) by mouth 2 (two) times daily. Until gone 10 capsule 0  . aspirin EC 325 MG tablet Take 325 mg by mouth daily.    Marland Kitchen atorvastatin (LIPITOR) 20 MG tablet Take 20 mg by mouth daily at 6 PM.     . cholecalciferol (VITAMIN D) 1000 UNITS tablet Take 1,000 Units by mouth daily.    . furosemide (LASIX) 40 MG tablet Take 1 tablet (40 mg total) by mouth 2 (two) times daily. 60 tablet 0  . glipiZIDE (GLUCOTROL) 10 MG tablet Take 1 tablet (10 mg total) by mouth 2 (two) times daily before a meal. 60 tablet 0  . hydrALAZINE (APRESOLINE) 50 MG tablet  Take 1.5 tablets (75 mg total) by mouth 3 (three) times daily. 140 tablet 0  . Insulin Glargine (LANTUS) 100 UNIT/ML Solostar Pen Inject 10 Units into the skin every morning. 15 mL 1  . Insulin Pen Needle (FIFTY50 PEN NEEDLES) 31G X 5 MM MISC As directed 30 each 1  . irbesartan (AVAPRO) 300 MG tablet Take 0.5 tablets (150 mg total) by mouth daily.    . isosorbide mononitrate (IMDUR) 120 MG 24 hr tablet Take 1 tablet (120 mg total) by mouth daily. 30 tablet 5  . metoprolol (LOPRESSOR) 50 MG tablet Take 1.5 tablets (75 mg total) by mouth 2 (two) times daily. 90 tablet 0  . nitroGLYCERIN (NITROSTAT) 0.4 MG SL tablet Place 1 tablet (0.4 mg total) under  the tongue every 5 (five) minutes as needed for chest pain. 25 tablet 3  . omeprazole (PRILOSEC) 20 MG capsule Take 20 mg by mouth daily.    . predniSONE (DELTASONE) 20 MG tablet Take 0.5 tablets (10 mg total) by mouth daily with breakfast. Take for 14 days     No current facility-administered medications for this visit.    Allergies:   Review of patient's allergies indicates no known allergies.   Social History:  The patient  reports that he quit smoking about 6 months ago. His smoking use included Cigarettes. He has a 50 pack-year smoking history. He has never used smokeless tobacco. He reports that he does not drink alcohol or use illicit drugs.   Family History:  The patient's family history includes CAD in his mother; Cirrhosis in his brother.   ROS:  Please see the history of present illness.   All other systems reviewed and negative.   PHYSICAL EXAM:  VS:  BP 140/52 mmHg  Pulse 56  Ht 5\' 6"  (1.676 m)  Wt 165 lb 9.6 oz (75.116 kg)  BMI 26.74 kg/m2 Well nourished, well developed, in no acute distress HEENT: normal Neck: no JVD Cardiac:  normal S1, S2; RRR; no murmur Lungs:  clear to auscultation bilaterally, no wheezing, rhonchi or rales Abd: soft, rounded, nontender, no hepatomegaly Ext: no edema, decreased DP pulses, 2+ radial  pulses bilaterally Skin: warm and dry, well healed sternotomy, left radial cath site well healed without ecchymosis Neuro:  moves all extremities spontaneously, no focal abnormalities noted  EKG:  Sinus bradycardia 56bpm, RBBB, nonspecific ST-T changes, appears improved from prior tracing. Prior EKGs also show the RSR V1 with upsloping of ST segment as seen today.  ASSESSMENT AND PLAN:  1. CAD with hx of CABG with redo, recent NSTEMI with recommendation for medical therapy following cath 2. HTN 3. CKD stage III-IV 4. Anemia with recent +FOBT 5. Polymyalgia rheumatica 6. Hyperlipidemia, on statin 7. Chronic combined CHF - EF 45-50% by echo 08/26/14, grade 2 d/d 8. SVT without known clinical recurrence 9. PVD with claudication (s/p remote LE stenting, carotid artery disease)  Patient is doing well s/p recent hospitalization for NSTEMI.  Will recheck BMET and CBC today for CKD and anemia. Depending on his Hgb level, he may need to f/u PCP sooner for evaluation of recent +FOBT. Continue PPI. He was not started on Plavix during his admission for NSTEMI - would hold off on this given the recent issue of anemia as well as history of hematuria. He has not had evidence of GI bleeding since discharge. Blood pressure is mildly elevated in the office today but he reports normal values at home. Encouraged continued medication and diet compliance - he seems really motivated to continue taking care of himself.  Will refer to Dr Fletcher Anon with known PAD and limitation in activity due to claudication. Might consider further titrating statin at next OV.  Dispo: F/u with Dr. Fletcher Anon with PV as above. Will also have him check back in with Dr. Irish Lack in 6 weeks.  Signed, Melina Copa, PA-C - patient seen and examined personally alongside Ms. [employer], NP-S. 09/06/2014 10:31 AM

## 2014-09-06 NOTE — Telephone Encounter (Signed)
pt notified of lab results and to hold lasix and avapro this weekend, bmet Monday 11/23. Pt advised tcb if BP >140/80 or if wt up 3 lb's x 1 day. Pt aware to f/u w/PCP about anemia and WBC elevated, pt states he is on antibiotic for UTI and prednisone.

## 2014-09-06 NOTE — Addendum Note (Signed)
Addended by: Tamsen Snider on: 09/06/2014 04:32 PM   Modules accepted: Orders

## 2014-09-06 NOTE — Patient Instructions (Signed)
Your physician recommends that you continue on your current medications as directed. Please refer to the Current Medication list given to you today.   Your physician recommends that you have lab work today: bmet/ cbc   You have been referred to [facility] for claudification/abnormal ABIS  Your physician recommends that you keep scheduled  follow-up appointment with Dr. Irish Lack

## 2014-09-07 ENCOUNTER — Telehealth: Payer: Self-pay | Admitting: Physician Assistant

## 2014-09-07 ENCOUNTER — Telehealth: Payer: Self-pay | Admitting: Cardiology

## 2014-09-07 NOTE — Telephone Encounter (Signed)
Patient called stating that he was instructed to hold his irbesartan and Lasix due to increased creatinine.  He was supposed to come in Monday for repeat labs.  He called tonight because he was noticing more SOB and weighed himself and he is up 3 lbs.  He also has a cold so he is not sure if the SOB is partly due to the cold or volume overload. He is very concerned since he was  Just in the hospital with CHF.  I have instructed him to take Lasix 40mg  now.  He will weigh himself in the am and call the MD on call for further instructions.  I have instructed him to come to the ER if his SOB worsens.  Patient understands and agrees.

## 2014-09-07 NOTE — Telephone Encounter (Signed)
Patient called because he was told at the office yesterday to contact us if his systolic blood pressure was greater than 150.  Yesterday he had lab work and because his BUN/creatinine were higher than usual, his Lasix and Avapro are on hold.  Discussed medications with the patient. He has not yet taken his second dose of hydralazine 75 mg today. Advised him he could take that early. Advised him he could take an extra half tablet of hydralazine every time his blood pressure was greater than 150. Advised him he should document those episodes and let us know how often he is like that, but does not have to contact us unless his systolic blood pressure is greater than 170.  The patient was in agreement with this as a plan of care.

## 2014-09-09 ENCOUNTER — Ambulatory Visit
Admission: RE | Admit: 2014-09-09 | Discharge: 2014-09-09 | Disposition: A | Discharge status: Home / Self Care | Payer: Medicare Other | Source: Ambulatory Visit | Attending: Rheumatology | Admitting: Rheumatology

## 2014-09-09 ENCOUNTER — Other Ambulatory Visit (INDEPENDENT_AMBULATORY_CARE_PROVIDER_SITE_OTHER): Payer: Medicare Other | Admitting: *Deleted

## 2014-09-09 ENCOUNTER — Other Ambulatory Visit: Payer: Self-pay | Admitting: Rheumatology

## 2014-09-09 ENCOUNTER — Telehealth: Payer: Self-pay | Admitting: *Deleted

## 2014-09-09 DIAGNOSIS — M542 Cervicalgia: Secondary | ICD-10-CM

## 2014-09-09 DIAGNOSIS — M545 Low back pain: Secondary | ICD-10-CM

## 2014-09-09 DIAGNOSIS — M353 Polymyalgia rheumatica: Secondary | ICD-10-CM | POA: Diagnosis not present

## 2014-09-09 DIAGNOSIS — M47816 Spondylosis without myelopathy or radiculopathy, lumbar region: Secondary | ICD-10-CM | POA: Diagnosis not present

## 2014-09-09 DIAGNOSIS — I5032 Chronic diastolic (congestive) heart failure: Secondary | ICD-10-CM

## 2014-09-09 DIAGNOSIS — D509 Iron deficiency anemia, unspecified: Secondary | ICD-10-CM | POA: Diagnosis not present

## 2014-09-09 DIAGNOSIS — R700 Elevated erythrocyte sedimentation rate: Secondary | ICD-10-CM | POA: Diagnosis not present

## 2014-09-09 DIAGNOSIS — Z981 Arthrodesis status: Secondary | ICD-10-CM | POA: Diagnosis not present

## 2014-09-09 LAB — BASIC METABOLIC PANEL
BUN: 61 mg/dL — AB (ref 6–23)
CO2: 25 mEq/L (ref 19–32)
CREATININE: 1.9 mg/dL — AB (ref 0.4–1.5)
Calcium: 9.2 mg/dL (ref 8.4–10.5)
Chloride: 98 mEq/L (ref 96–112)
GFR: 36.45 mL/min — AB (ref >60.00)
Glucose, Bld: 190 mg/dL — ABNORMAL HIGH (ref 70–99)
POTASSIUM: 4.7 meq/L (ref 3.5–5.1)
Sodium: 135 mEq/L (ref 135–145)

## 2014-09-09 MED ORDER — PREDNISONE 10 MG PO TABS: ORAL_TABLET | ORAL | Status: DC

## 2014-09-09 NOTE — Telephone Encounter (Signed)
pt notified about lab results with verbal understanding. Pt states his prednisone dose was just decreased to alt days of 10 mg one day, 5 mg the next until 09/17/14, then he goes to 5 mg per pt. I told him I will reflect his chart as of today for change.

## 2014-09-18 DIAGNOSIS — M353 Polymyalgia rheumatica: Secondary | ICD-10-CM | POA: Diagnosis not present

## 2014-09-18 DIAGNOSIS — N183 Chronic kidney disease, stage 3 (moderate): Secondary | ICD-10-CM | POA: Diagnosis not present

## 2014-09-18 DIAGNOSIS — E119 Type 2 diabetes mellitus without complications: Secondary | ICD-10-CM | POA: Diagnosis not present

## 2014-09-18 DIAGNOSIS — I25119 Atherosclerotic heart disease of native coronary artery with unspecified angina pectoris: Secondary | ICD-10-CM | POA: Diagnosis not present

## 2014-09-18 DIAGNOSIS — I5022 Chronic systolic (congestive) heart failure: Secondary | ICD-10-CM | POA: Diagnosis not present

## 2014-09-18 DIAGNOSIS — I10 Essential (primary) hypertension: Secondary | ICD-10-CM | POA: Diagnosis not present

## 2014-09-24 ENCOUNTER — Telehealth: Payer: Self-pay | Admitting: Interventional Cardiology

## 2014-09-24 NOTE — Telephone Encounter (Signed)
Returned pt call. Pt is interested in starting Cardiac Rehab @ [place]. Adv him I will fwd a message to [place] for approval. and call back with his response.

## 2014-09-24 NOTE — Telephone Encounter (Signed)
New Message         Pt calling stating that he was recently in the hospital and feels that he is in need of cardiac rehab. Pt wants this to be run by Dr. Tamala Julian to see what he thinks and to find out what needs to be done in order to get this done. Please call back and advise.

## 2014-09-25 DIAGNOSIS — M545 Low back pain: Secondary | ICD-10-CM | POA: Diagnosis not present

## 2014-09-25 DIAGNOSIS — R700 Elevated erythrocyte sedimentation rate: Secondary | ICD-10-CM | POA: Diagnosis not present

## 2014-09-25 DIAGNOSIS — M542 Cervicalgia: Secondary | ICD-10-CM | POA: Diagnosis not present

## 2014-09-25 DIAGNOSIS — M353 Polymyalgia rheumatica: Secondary | ICD-10-CM | POA: Diagnosis not present

## 2014-09-25 DIAGNOSIS — N189 Chronic kidney disease, unspecified: Secondary | ICD-10-CM | POA: Diagnosis not present

## 2014-09-25 NOTE — Telephone Encounter (Signed)
We should start cardiac rehabilitation

## 2014-09-26 ENCOUNTER — Encounter (HOSPITAL_COMMUNITY): Payer: Self-pay | Admitting: Interventional Cardiology

## 2014-09-26 NOTE — Telephone Encounter (Signed)
Pt aware that Dr.Smith has given the ok for pt to be ref to [facility] cardiac rehab. Adv pt to give [facility] 5 days to contact him.pt verbalized understanding.

## 2014-10-07 ENCOUNTER — Other Ambulatory Visit: Payer: Self-pay | Admitting: *Deleted

## 2014-10-07 MED ORDER — METOPROLOL TARTRATE 50 MG PO TABS: 75.0000 mg | ORAL_TABLET | Freq: Two times a day (BID) | ORAL | Status: DC

## 2014-10-10 ENCOUNTER — Telehealth: Payer: Self-pay | Admitting: Interventional Cardiology

## 2014-10-10 NOTE — Telephone Encounter (Signed)
Spoke with pt, he is currently taking lasix 40 mg three times daily. His weight is stable only fluctuating 1 lb. He has no edema, he has been having SOB when he gets up in the morning so he slept in the recliner last night. He states he is SOB with any activity and it is not getting better. He is watching his sodium and cautioned on fluid intake. He wants to know if he needs to increase his lasix. Will forward for dayna dunn pa review

## 2014-10-10 NOTE — Telephone Encounter (Signed)
New message      Pt takes lasix tid.  He says he feels like he has a little water in his lungs.  A nurse from [facility] says he sounds wheezy.  If he moves around he is sob. Pt c/o Shortness Of Breath: STAT if SOB developed within the last 24 hours or pt is noticeably SOB on the phone  1. Are you currently SOB (can you hear that pt is SOB on the phone)? No  2. How long have you been experiencing SOB? Since last friday  3. Are you SOB when sitting or when up moving around? Mainly when he is moving around----sitting still he is ok  4. Are you currently experiencing any other symptoms? Only except he feels like he is a little wheezy.  Pt slept in the recliner last night----cannot lie flat.

## 2014-10-10 NOTE — Telephone Encounter (Signed)
Spoke with pt, aware of dayna's recommendations. He is leaving to go out of town, cautioned patient about eating and drinking. He will see dayna dunn pa on monday

## 2014-10-10 NOTE — Telephone Encounter (Signed)
Somewhat difficult situation. When he was last seen in the office, his creatinine was up to 2.3 so we had to back down on his Lasix. Per repeat labs on 11/23 (Cr 1.9) he was cleared to return to Lasix BID. Triage note indicates he is now taking TID but we do not have representation of his renal function on this dose. I am hesitant to increase his Lasix without this information. Can we please get him into flex clinic on Monday with a BMET? Since weight is stable we could have home health try a Xopenex neb 0.63mg  TID PRN SOB. If symptoms worsen or progress before Monday would advise he proceed to urgent care or ED for evaluation. Srihith Aquilino PA-C

## 2014-10-12 DIAGNOSIS — I255 Ischemic cardiomyopathy: Secondary | ICD-10-CM | POA: Diagnosis not present

## 2014-10-12 DIAGNOSIS — Z87891 Personal history of nicotine dependence: Secondary | ICD-10-CM | POA: Diagnosis not present

## 2014-10-12 DIAGNOSIS — I10 Essential (primary) hypertension: Secondary | ICD-10-CM | POA: Diagnosis present

## 2014-10-12 DIAGNOSIS — K219 Gastro-esophageal reflux disease without esophagitis: Secondary | ICD-10-CM | POA: Diagnosis present

## 2014-10-12 DIAGNOSIS — N183 Chronic kidney disease, stage 3 (moderate): Secondary | ICD-10-CM | POA: Diagnosis not present

## 2014-10-12 DIAGNOSIS — I252 Old myocardial infarction: Secondary | ICD-10-CM | POA: Diagnosis not present

## 2014-10-12 DIAGNOSIS — J811 Chronic pulmonary edema: Secondary | ICD-10-CM | POA: Diagnosis not present

## 2014-10-12 DIAGNOSIS — I452 Bifascicular block: Secondary | ICD-10-CM | POA: Diagnosis not present

## 2014-10-12 DIAGNOSIS — I509 Heart failure, unspecified: Secondary | ICD-10-CM | POA: Diagnosis not present

## 2014-10-12 DIAGNOSIS — Z951 Presence of aortocoronary bypass graft: Secondary | ICD-10-CM | POA: Diagnosis not present

## 2014-10-12 DIAGNOSIS — J810 Acute pulmonary edema: Secondary | ICD-10-CM | POA: Diagnosis not present

## 2014-10-12 DIAGNOSIS — R0602 Shortness of breath: Secondary | ICD-10-CM | POA: Diagnosis not present

## 2014-10-12 DIAGNOSIS — E119 Type 2 diabetes mellitus without complications: Secondary | ICD-10-CM | POA: Diagnosis not present

## 2014-10-12 DIAGNOSIS — R600 Localized edema: Secondary | ICD-10-CM | POA: Diagnosis not present

## 2014-10-12 DIAGNOSIS — I2510 Atherosclerotic heart disease of native coronary artery without angina pectoris: Secondary | ICD-10-CM | POA: Diagnosis not present

## 2014-10-12 DIAGNOSIS — I129 Hypertensive chronic kidney disease with stage 1 through stage 4 chronic kidney disease, or unspecified chronic kidney disease: Secondary | ICD-10-CM | POA: Diagnosis present

## 2014-10-13 ENCOUNTER — Encounter: Payer: Self-pay | Admitting: Physician Assistant

## 2014-10-13 NOTE — Progress Notes (Signed)
[address] [place], [place]  [address] Phone: [phone] Fax:  [phone]  Date:  10/14/2014   Patient ID:  [name], DOB [birth year], MRN [id number]   PCP:  Shirline Frees, MD  Cardiologist: Dr. Tamala Julian   History of Present Illness: [name] is a 73 y.o. male with history of CAD (CABG in 1999 with multiple PCIs since, repeat CABG 02/2014 with SVG to D1, SVG to PDA, and SVG to OM), HLD, OSA, CHF (EF 45-50% in 08/2014 with grade II DD, >55% by echo 09/2014 at [facility]), T2DM, COPD, OSA (doesn't wear mask), stage III-IV CKD, polymyalgia rheumatica, PVD including carotid stenosis (moderate by duplex 12/2013, remote LE stenting), anemia who presents for follow-up of dyspnea and CHF. He was just admitted to [facility] this past weekend.  With regard to recent hx, he was admitted 08/2014 for NSTEMI and CBG 498 in setting of recently starting prednisone for PMR. He underwent cath showing "now probably total occlusion of the old SVG-PDA, the only other potential culprit lesion would be the sequential leg of the SVG-D2-D3.Marland Kitchen Based on the size of the graft distally, and the angle to take through the old graft for a relatively small downstream distribution, this is thought to be best treated medically as it is not that different from previous catheterization." Report also notes "Severely elevated LVEDP of 30 mmHg with systemic hypertension. Potential etiology for positive troponin could simply be elevated LVEDP in the setting of microvascular and some otherwise small vessel disease." He was also noted to have SVT on telemetry and beta blocker was increased. 2D echo 08/26/14: EF 45-50%, akinesis inferiorly, grade 2 d/d, mild MR, no sig change from prior. LDL 64. When last seen in follow-up on 09/06/14 he was doing well from a symptom standpoint but had elevated Cr up to 2.3 (previous baseline ~1.7). We temporarily held his Lasix and irbesartan over that weekend but he had recurrence of  dyspnea and weight gain just one day later. F/u Cr the following Monday was 1.9 and meds were restarted. CBC also showed continued anemia, Hgb 10.9 up from previous of 9.3 (had +FOBT 08/2014 but denied evidence of GI bleeding and was asked to f/u PCP for this). At that [place] he was also c/o claudication and was referred to Dr. Fletcher Anon, appointment pending 10/22/13. Last ABIs 02/2014 with severe arterial insufficiency.   Over the holidays he noticed gradual weight gain. He had increased Lasix to 40mg  TID with some weight loss but continued SOB. He was visiting family in [place] this past weekend and became severely SOB and hypertensive so proceeded to the [facility] ED. He was admitted and diuresed with IV Lasix with 5lb weight loss. His Lasix was changed to torsemide 40mg  BID at discharge. 2D Echo 12/26: normal EF >55% mild LVH, diastolic dysfunction, normal RV function, trivial MR/PR/TR. D/c Cr 2.1 with K 4.9 but hemolyzed. Hgb stable at 10.6.  He feels significantly better since discharge. O2 sat 96% resting today and on ambulation. No dyspnea, orthopnea, LEE, chest pain or syncope. Denies any evidence of GI bleeding.  Recent Labs: 08/27/2014: Pro B Natriuretic peptide (BNP) 2854.0* 08/30/2014: ALT 37; TSH 2.860 09/03/2014: HDL Cholesterol by NMR 41; LDL (calc) 64 09/06/2014: Hemoglobin 10.9* 09/09/2014: Creatinine 1.9*; Potassium 4.7  Wt Readings from Last 3 Encounters:  10/14/14 157 lb (71.215 kg)  09/06/14 165 lb 9.6 oz (75.116 kg)  09/03/14 167 lb 12.8 oz (76.114 kg)     Past Medical History  Diagnosis Date  . Carotid artery occlusion     a. Duplex 12/2013: mild plaque bilat, 40-59% BICA. F/u due 12/2014.  Marland Kitchen Hyperlipidemia   . Atherosclerosis of native arteries of the extremities with intermittent claudication     a. L external iliac stent 2001, bilateral SFA occlusions documented 2001.  Marland Kitchen Hypertension   . GERD (gastroesophageal reflux disease)   . Esophageal stricture     a. History of dilitation  - Long-standing history of esophageal reflux   . 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 1999: LIMA to LAD, sequential SVG to diagonal 2 and diagonal 3, sequential SVG to PDA, acute marginal branch, and PL branch. b. Repeat CABG 2015 with SVG to D1, SVG to PDA, and SVG to OM.   Marland Kitchen CAD (coronary artery disease), native coronary artery 09/24/2013    a. CABG x 6 1999. b. Interim PCIs: Angioplasty SVG-PDA 2006.Taxus DES to native LCx in 2007.DES to SVG-PDA 2007, restented 2009.NSTEMI with PCI to SVG- PDA complicated by no-reflow/inferior infarction in 2012. c. Redo CABG 2015 with SVG-PD, SVG-D1, SVG-OM1.  . Chronic combined systolic and diastolic CHF (congestive heart failure)     a. Prior EF 45-50% in 08/2014. b. Improved EF >55% in 09/2014 at [facility].  . MI (myocardial infarction) 1999-02/2014  . OSA (obstructive sleep apnea)     "don't wear mask" (08/26/2014)  . Type II diabetes mellitus   . Anemia     a. + FOBT 08/2014.  Marland Kitchen History of hiatal hernia   . Daily headache   . Arthritis   . Chronic lower back pain   . CKD (chronic kidney disease) stage 3, GFR 30-59 ml/min   . Skin cancer   . Hypotonic bladder     Requiring in and out catheterization performed by the patient at home - occasional blood tinged urine (self-cath's daily for the last 2 years) and hematuria is chronic for him, followed by urology.  . Polymyalgia rheumatica   . SVT (supraventricular tachycardia)   . COPD (chronic obstructive pulmonary disease)     Current Outpatient Prescriptions  Medication Sig Dispense Refill  . amLODipine (NORVASC) 10 MG tablet Take 10 mg by mouth daily.    Marland Kitchen aspirin EC 325 MG tablet Take 325 mg by mouth daily.    Marland Kitchen atorvastatin (LIPITOR) 20 MG tablet Take 20 mg by mouth daily at 6 PM.     . cholecalciferol (VITAMIN D) 1000 UNITS tablet Take 1,000 Units by mouth daily.    Marland Kitchen glipiZIDE (GLUCOTROL) 10 MG tablet Take 1 tablet (10 mg total) by mouth 2 (two) times daily before a meal. 60 tablet 0  . hydrALAZINE  (APRESOLINE) 50 MG tablet Take 1.5 tablets (75 mg total) by mouth 3 (three) times daily. 140 tablet 0  . Insulin Glargine (LANTUS) 100 UNIT/ML Solostar Pen Inject 10 Units into the skin every morning. 15 mL 1  . Insulin Pen Needle (FIFTY50 PEN NEEDLES) 31G X 5 MM MISC As directed 30 each 1  . irbesartan (AVAPRO) 300 MG tablet Take 0.5 tablets (150 mg total) by mouth daily.    . isosorbide mononitrate (IMDUR) 120 MG 24 hr tablet Take 1 tablet (120 mg total) by mouth daily. 30 tablet 5  . metoprolol (LOPRESSOR) 50 MG tablet Take 1.5 tablets (75 mg total) by mouth 2 (two) times daily. 90 tablet 1  . omeprazole (PRILOSEC) 20 MG capsule Take 20 mg by mouth daily.    Marland Kitchen torsemide (DEMADEX) 20 MG tablet Take 40  mg by mouth 2 (two) times daily.     Marland Kitchen acetaminophen (TYLENOL) 325 MG tablet Take 650 mg by mouth every 6 (six) hours as needed for moderate pain.    . nitroGLYCERIN (NITROSTAT) 0.4 MG SL tablet Place 1 tablet (0.4 mg total) under the tongue every 5 (five) minutes as needed for chest pain. (Patient not taking: Reported on 10/14/2014) 25 tablet 3   No current facility-administered medications for this visit.    Allergies:   Review of patient's allergies indicates no known allergies.   Social History:  The patient  reports that he quit smoking about 7 months ago. His smoking use included Cigarettes. He has a 50 pack-year smoking history. He has never used smokeless tobacco. He reports that he does not drink alcohol or use illicit drugs.   Family History:  The patient's family history includes CAD in his mother; Cirrhosis in his brother.  ROS:  Please see the history of present illness.    All other systems reviewed and negative.   PHYSICAL EXAM:  VS:  BP 110/70 mmHg  Pulse 65  Ht 5\' 6"  (1.676 m)  Wt 157 lb (71.215 kg)  BMI 25.35 kg/m2 Well nourished, well developed WM, in no acute distress HEENT: normal Neck: no JVD Cardiac:  normal S1, S2; RRR; no murmur Lungs:  clear to auscultation  bilaterally, no wheezing, rhonchi or rales Abd: soft, nontender, no hepatomegaly Ext: no edema Skin: warm and dry Neuro:  moves all extremities spontaneously, no focal abnormalities noted  EKG:  NSR with PACs 65bpm, RBBB, inferior infarct age undetermined, nonspecific inferolateral T wave changes similar to 06/2014 and 08/2014  ASSESSMENT AND PLAN:  1. Dyspnea - resolved. Due to recent a/c CHF, diuresed at [facility]. EF actually improved by echo there. Continue torsemide and other home meds. Given that his potassium level was creeping up by last labs, will recheck today. O2 level normal in clinic today both at rest and with ambulation. 2. Chronic combined CHF/hypertensive heart disease - see above. 3. CKD stage III-IV - at this point I think he would benefit from referral to nephrology for evaluation of secondary sequelae from CKD such as metabolic bone disease and anemia. Recent data indicates better outcomes when patients are referred at stage III. Will refer. 4. H/o anemia with + FOBT 08/2014 - denies evidence of GI bleeding. Stable by recent labs. Advised to f/u PCP. 5. CAD s/p CABG with redo as above - no recent chest pain. Continue medical therapy. 6. Essential HTN - controlled. 7. PVD with claudication - to see Dr. Fletcher Anon next week. 8. Carotid artery disease - has f/u duplex scheduled 12/2014.  Dispo: F/u Dr. Fletcher Anon for PVD eval. His primary cardiologist is Dr. Tamala Julian. He was inadvertently scheduled with Dr. Irish Lack for f/u after last discharge - will r/s to see Dr. Tamala Julian in 6 weeks.  Signed, Melina Copa, PA-C  10/14/2014 9:08 AM

## 2014-10-14 ENCOUNTER — Encounter: Payer: Self-pay | Admitting: Physician Assistant

## 2014-10-14 ENCOUNTER — Ambulatory Visit (INDEPENDENT_AMBULATORY_CARE_PROVIDER_SITE_OTHER): Payer: Medicare Other | Admitting: Physician Assistant

## 2014-10-14 ENCOUNTER — Telehealth: Payer: Self-pay | Admitting: *Deleted

## 2014-10-14 VITALS — BP 110/70 | HR 65 | Ht 66.0 in | Wt 157.0 lb

## 2014-10-14 DIAGNOSIS — R0600 Dyspnea, unspecified: Secondary | ICD-10-CM

## 2014-10-14 DIAGNOSIS — D638 Anemia in other chronic diseases classified elsewhere: Secondary | ICD-10-CM | POA: Diagnosis not present

## 2014-10-14 DIAGNOSIS — I110 Hypertensive heart disease with heart failure: Secondary | ICD-10-CM

## 2014-10-14 DIAGNOSIS — I10 Essential (primary) hypertension: Secondary | ICD-10-CM

## 2014-10-14 DIAGNOSIS — N183 Chronic kidney disease, stage 3 unspecified: Secondary | ICD-10-CM

## 2014-10-14 DIAGNOSIS — I2510 Atherosclerotic heart disease of native coronary artery without angina pectoris: Secondary | ICD-10-CM | POA: Diagnosis not present

## 2014-10-14 DIAGNOSIS — I214 Non-ST elevation (NSTEMI) myocardial infarction: Secondary | ICD-10-CM | POA: Diagnosis not present

## 2014-10-14 DIAGNOSIS — I509 Heart failure, unspecified: Secondary | ICD-10-CM | POA: Diagnosis not present

## 2014-10-14 DIAGNOSIS — I5042 Chronic combined systolic (congestive) and diastolic (congestive) heart failure: Secondary | ICD-10-CM

## 2014-10-14 DIAGNOSIS — I739 Peripheral vascular disease, unspecified: Secondary | ICD-10-CM

## 2014-10-14 DIAGNOSIS — I779 Disorder of arteries and arterioles, unspecified: Secondary | ICD-10-CM

## 2014-10-14 LAB — BASIC METABOLIC PANEL
BUN / CREAT RATIO: 21 (ref 10–22)
BUN: 45 mg/dL — AB (ref 8–27)
CO2: 31 mmol/L — AB (ref 18–29)
CREATININE: 2.15 mg/dL — AB (ref 0.76–1.27)
Calcium: 9.6 mg/dL (ref 8.6–10.2)
Chloride: 97 mmol/L (ref 96–108)
GFR calc non Af Amer: 29 mL/min/{1.73_m2} — ABNORMAL LOW (ref >59)
GFR, EST AFRICAN AMERICAN: 34 mL/min/{1.73_m2} — AB (ref >59)
Glucose: 112 mg/dL — ABNORMAL HIGH (ref 65–99)
Potassium: 4.6 mmol/L (ref 3.5–5.2)
SODIUM: 139 mmol/L (ref 134–144)

## 2014-10-14 NOTE — Patient Instructions (Signed)
Your physician recommends that you continue on your current medications as directed. Please refer to the Current Medication list given to you today.   Your physician recommends that you return for lab work in:  BMET    [place].   FOLLOW UP WITH DR Tamala Julian IN [place] Tamala Julian

## 2014-10-14 NOTE — Telephone Encounter (Signed)
pt notified about lab results with verbal understanding  

## 2014-10-18 ENCOUNTER — Other Ambulatory Visit: Payer: Self-pay | Admitting: Interventional Cardiology

## 2014-10-22 ENCOUNTER — Encounter: Payer: Self-pay | Admitting: Cardiovascular Disease

## 2014-10-22 ENCOUNTER — Ambulatory Visit (INDEPENDENT_AMBULATORY_CARE_PROVIDER_SITE_OTHER): Payer: Medicare Other | Admitting: Cardiovascular Disease

## 2014-10-22 VITALS — BP 124/54 | HR 56 | Ht 66.0 in | Wt 161.8 lb

## 2014-10-22 DIAGNOSIS — I2510 Atherosclerotic heart disease of native coronary artery without angina pectoris: Secondary | ICD-10-CM | POA: Diagnosis not present

## 2014-10-22 DIAGNOSIS — I739 Peripheral vascular disease, unspecified: Secondary | ICD-10-CM | POA: Diagnosis not present

## 2014-10-22 NOTE — Patient Instructions (Signed)
Your physician has requested that you have a lower extremity arterial doppler. This test is an ultrasound of the arteries in the legs. It looks at arterial blood flow in the legs. Allow one hour for Lower Arterial scans. There are no restrictions or special instructions  Your physician has recommended an aorto-iliac duplex.  Your physician recommends that you schedule a follow-up appointment in: 1 MONTH with Dr Fletcher Anon  Your physician recommends that you continue on your current medications as directed. Please refer to the Current Medication list given to you today.

## 2014-10-22 NOTE — Progress Notes (Signed)
Primary cardiologist: Dr. Tamala Julian  HPI  This is a pleasant 74 year old man who was referred for management of peripheral arterial disease. He has extensive medical problems that include CAD (CABG in 1999 with multiple PCIs since, repeat CABG 02/2014, HLD, OSA, CHF (EF 45-50% in 08/2014 with grade II DD, >55% by echo 09/2014 at [facility]), T2DM, COPD, OSA (doesn't wear mask), stage III-IV CKD, polymyalgia rheumatica, PVD including carotid stenosis (moderate by duplex 12/2013, remote LE stenting) and anemia.  2D echo 08/26/14: EF 45-50%, akinesis inferiorly, grade 2 d/d, mild MR, no sig change from prior. LDL 64.  He has prolonged history of peripheral arterial disease with previous iliac stenting in 2001 done by Dr. Amedeo Plenty. He is known to have bilateral SFA occlusions. He reports bilateral cough claudication which is equal in both sides after walking about 50 yards. He has to rest for about 5-10 minutes before he can resume his symptoms have been stable for a few years. He quit smoking in May 2015. No rest pain or lower extremity ulceration.  No Known Allergies   Current Outpatient Prescriptions on File Prior to Visit  Medication Sig Dispense Refill  . acetaminophen (TYLENOL) 325 MG tablet Take 650 mg by mouth every 6 (six) hours as needed for moderate pain.    Marland Kitchen amLODipine (NORVASC) 10 MG tablet Take 10 mg by mouth daily.    Marland Kitchen aspirin EC 325 MG tablet Take 325 mg by mouth daily.    Marland Kitchen atorvastatin (LIPITOR) 20 MG tablet Take 20 mg by mouth daily at 6 PM.     . cholecalciferol (VITAMIN D) 1000 UNITS tablet Take 1,000 Units by mouth daily.    Marland Kitchen glipiZIDE (GLUCOTROL) 10 MG tablet Take 1 tablet (10 mg total) by mouth 2 (two) times daily before a meal. 60 tablet 0  . hydrALAZINE (APRESOLINE) 50 MG tablet Take 1.5 tablets (75 mg total) by mouth 3 (three) times daily. 140 tablet 0  . Insulin Glargine (LANTUS) 100 UNIT/ML Solostar Pen Inject 10 Units into the skin every morning. 15 mL 1  . Insulin Pen Needle  (FIFTY50 PEN NEEDLES) 31G X 5 MM MISC As directed 30 each 1  . irbesartan (AVAPRO) 300 MG tablet TAKE 1 TABLET BY MOUTH EVERY DAY (Patient taking differently: patient taking one half tablet daily) 30 tablet 2  . isosorbide mononitrate (IMDUR) 120 MG 24 hr tablet Take 1 tablet (120 mg total) by mouth daily. 30 tablet 5  . metoprolol (LOPRESSOR) 50 MG tablet Take 1.5 tablets (75 mg total) by mouth 2 (two) times daily. 90 tablet 1  . omeprazole (PRILOSEC) 20 MG capsule Take 20 mg by mouth daily.    Marland Kitchen torsemide (DEMADEX) 20 MG tablet Take 40 mg by mouth 2 (two) times daily.      No current facility-administered medications on file prior to visit.     Past Medical History  Diagnosis Date  . Carotid artery occlusion     a. Duplex 12/2013: mild plaque bilat, 40-59% BICA. F/u due 12/2014.  Marland Kitchen Hyperlipidemia   . Atherosclerosis of native arteries of the extremities with intermittent claudication     a. L external iliac stent 2001, bilateral SFA occlusions documented 2001.  Marland Kitchen Hypertension   . GERD (gastroesophageal reflux disease)   . Esophageal stricture     a. History of dilitation - Long-standing history of esophageal reflux   . 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 1999: LIMA to LAD, sequential SVG to diagonal 2 and diagonal 3, sequential  SVG to PDA, acute marginal branch, and PL branch. b. Repeat CABG 2015 with SVG to D1, SVG to PDA, and SVG to OM.   Marland Kitchen CAD (coronary artery disease), native coronary artery 09/24/2013    a. CABG x 6 1999. b. Interim PCIs: Angioplasty SVG-PDA 2006.Taxus DES to native LCx in 2007.DES to SVG-PDA 2007, restented 2009.NSTEMI with PCI to SVG- PDA complicated by no-reflow/inferior infarction in 2012. c. Redo CABG 2015 with SVG-PD, SVG-D1, SVG-OM1.  . Chronic combined systolic and diastolic CHF (congestive heart failure)     a. Prior EF 45-50% in 08/2014. b. Improved EF >55% in 09/2014 at [facility].  . MI (myocardial infarction) 1999-02/2014  . OSA (obstructive sleep apnea)     "don't  wear mask" (08/26/2014)  . Type II diabetes mellitus   . Anemia     a. + FOBT 08/2014.  Marland Kitchen History of hiatal hernia   . Daily headache   . Arthritis   . Chronic lower back pain   . CKD (chronic kidney disease) stage 3, GFR 30-59 ml/min   . Skin cancer   . Hypotonic bladder     Requiring in and out catheterization performed by the patient at home - occasional blood tinged urine (self-cath's daily for the last 2 years) and hematuria is chronic for him, followed by urology.  . Polymyalgia rheumatica   . SVT (supraventricular tachycardia)   . COPD (chronic obstructive pulmonary disease)      Past Surgical History  Procedure Laterality Date  . Coronary stent placement      "I've had 8 put in; I presently have none" (08/26/2014)  . Cardiac catheterization    . Intraoperative transesophageal echocardiogram N/A 03/01/2014    Procedure: INTRAOPERATIVE TRANSESOPHAGEAL ECHOCARDIOGRAM;  Surgeon: Gaye Pollack, MD;  Location: [facility] OR;  Service: Open Heart Surgery;  Laterality: N/A;  . Anterior cervical decomp/discectomy fusion    . Ankle fracture surgery Right ~ 1977    "10 plates and 7 screws placed in it"  . Tonsillectomy    . Fracture surgery    . Cataract extraction w/ intraocular lens  implant, bilateral Bilateral 2000's  . Mohs surgery  X 2    "nose; nose"  . Skin cancer excision      "all over my head; ears; back  . Esophagogastroduodenoscopy (egd) with esophageal dilation  X 1  . Back surgery      neck surgery  . Coronary artery bypass graft  1999  . Coronary artery bypass graft N/A 03/01/2014    Procedure: REDO CORONARY ARTERY BYPASS GRAFTING TIMES THREE, USING RIGHT GREATER SAPHENOUS VEIN VIA ENDOVEIN HARVEST.;  Surgeon: Gaye Pollack, MD;  Location: [facility] OR;  Service: Open Heart Surgery;  Laterality: N/A;  . Left heart catheterization with coronary/graft angiogram  02/26/2014    Procedure: LEFT HEART CATHETERIZATION WITH Beatrix Fetters;  Surgeon: Sinclair Grooms, MD;   Location[facility] C[facility] LAB;  Service: Cardiovascular;;  . Left heart catheterization with coronary/graft angiogram N/A 07/23/2014    Procedure: LEFT HEART CATHETERIZATION WITH Beatrix Fetters;  Surgeon: Sinclair Grooms, MD;  Location: [facility] CATH LAB;  Service: Cardiovascular;  Laterality: N/A;  . Left heart catheterization with coronary/graft angiogram N/A 08/30/2014    Procedure: LEFT HEART CATHETERIZATION WITH Beatrix Fetters;  Surgeon: Leonie Man, MD;  Location: [facility] CATH LAB;  Service: Cardiovascular;  Laterality: N/A;     Family History  Problem Relation Age of Onset  . CAD Mother   . Cirrhosis Brother  History   Social History  . Marital Status: Married    Spouse Name: N/A    Number of Children: N/A  . Years of Education: N/A   Occupational History  . Not on file.   Social History Main Topics  . Smoking status: Former Smoker -- 1.00 packs/day for 50 years    Types: Cigarettes    Quit date: 02/25/2014  . Smokeless tobacco: Never Used  . Alcohol Use: No  . Drug Use: No  . Sexual Activity: Not on file   Other Topics Concern  . Not on file   Social History Narrative   Tour manager            ROS A 10 point review of system was performed. It is negative other than that mentioned in the history of present illness.   PHYSICAL EXAM   BP 124/54 mmHg  Pulse 56  Ht 5\' 6"  (1.676 m)  Wt 161 lb 12.8 oz (73.392 kg)  BMI 26.13 kg/m2  SpO2 96% Constitutional: He is oriented to person, place, and time. He appears well-developed and well-nourished. No distress.  HENT: No nasal discharge.  Head: Normocephalic and atraumatic.  Eyes: Pupils are equal and round.  No discharge. Neck: Normal range of motion. Neck supple. No JVD present. No thyromegaly present.  Cardiovascular: Normal rate, regular rhythm, normal heart sounds. Exam reveals no gallop and no friction rub. No murmur heard.  Pulmonary/Chest: Effort normal and breath sounds  normal. No stridor. No respiratory distress. He has no wheezes. He has no rales. He exhibits no tenderness.  Abdominal: Soft. Bowel sounds are normal. He exhibits no distension. There is no tenderness. There is no rebound and no guarding.  Musculoskeletal: Normal range of motion. He exhibits no edema and no tenderness.  Neurological: He is alert and oriented to person, place, and time. Coordination normal.  Skin: Skin is warm and dry. No rash noted. He is not diaphoretic. No erythema. No pallor.  Psychiatric: He has a normal mood and affect. His behavior is normal. Judgment and thought content normal.  Vascular: Femoral pulse: +1 on the right side and absent on the left side, distal pulses are not palpable        ASSESSMENT AND PLAN

## 2014-10-24 ENCOUNTER — Encounter (HOSPITAL_COMMUNITY)
Admission: RE | Admit: 2014-10-24 | Discharge: 2014-10-24 | Disposition: A | Discharge status: Home / Self Care | Payer: Medicare Other | Source: Ambulatory Visit | Attending: Interventional Cardiology | Admitting: Interventional Cardiology

## 2014-10-24 DIAGNOSIS — E1151 Type 2 diabetes mellitus with diabetic peripheral angiopathy without gangrene: Secondary | ICD-10-CM | POA: Insufficient documentation

## 2014-10-24 DIAGNOSIS — Z951 Presence of aortocoronary bypass graft: Secondary | ICD-10-CM | POA: Insufficient documentation

## 2014-10-24 DIAGNOSIS — Z5189 Encounter for other specified aftercare: Secondary | ICD-10-CM | POA: Insufficient documentation

## 2014-10-24 DIAGNOSIS — I252 Old myocardial infarction: Secondary | ICD-10-CM | POA: Insufficient documentation

## 2014-10-24 NOTE — Progress Notes (Signed)
Cardiac Rehab Medication Review by a Pharmacist  Does the patient  feel that his/her medications are working for him/her?  yes  Has the patient been experiencing any side effects to the medications prescribed?  no  Does the patient measure his/her own blood pressure or blood glucose at home?  Yes. BP 110s-120s. CBGs 100-110 fasting.  Does the patient have any problems obtaining medications due to transportation or finances?   no  Understanding of regimen: excellent Understanding of indications: good Potential of compliance: excellent  Pharmacist comments: Pt is a pleasant 74 yo male who presents to cardiac rehab today for review of his medications. He brings his own medication list with him, and reports adherence to all of his medications. He has good recall with dosing as well. Pt monitors his own BP and CBGs at home. He states that his BP is much lower now than a few months ago when the systolic would be in the 662H occasionally, and his fasting CBGs are within goal as well. He has no questions about his medications today.  Megan E. Supple, Pharm.D Clinical Pharmacy Resident Pager: [phone] 10/24/2014 8:17 AM

## 2014-10-25 ENCOUNTER — Encounter: Payer: Self-pay | Admitting: Cardiovascular Disease

## 2014-10-25 DIAGNOSIS — I739 Peripheral vascular disease, unspecified: Secondary | ICD-10-CM | POA: Insufficient documentation

## 2014-10-25 NOTE — Assessment & Plan Note (Signed)
He denies angina at the present time. 

## 2014-10-25 NOTE — Assessment & Plan Note (Signed)
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 I recommend evaluation with lower extremity arterial duplex as well as aortoiliac duplex. I think the priority is to treat his iliac disease. SFA occlusions have been present for many years. Continue treatment of risk factors. Angiography is limited by chronic kidney disease but we might be able to achieve iliac revascularization with relatively small amount of contrast.

## 2014-10-28 ENCOUNTER — Encounter (HOSPITAL_COMMUNITY)
Admission: RE | Admit: 2014-10-28 | Discharge: 2014-10-28 | Disposition: A | Discharge status: Home / Self Care | Payer: Medicare Other | Source: Ambulatory Visit | Attending: Interventional Cardiology | Admitting: Interventional Cardiology

## 2014-10-28 ENCOUNTER — Encounter (HOSPITAL_COMMUNITY): Payer: Self-pay

## 2014-10-28 DIAGNOSIS — I252 Old myocardial infarction: Secondary | ICD-10-CM | POA: Diagnosis not present

## 2014-10-28 DIAGNOSIS — Z951 Presence of aortocoronary bypass graft: Secondary | ICD-10-CM | POA: Diagnosis not present

## 2014-10-28 DIAGNOSIS — E1151 Type 2 diabetes mellitus with diabetic peripheral angiopathy without gangrene: Secondary | ICD-10-CM | POA: Diagnosis not present

## 2014-10-28 DIAGNOSIS — Z5189 Encounter for other specified aftercare: Secondary | ICD-10-CM | POA: Diagnosis not present

## 2014-10-28 LAB — GLUCOSE, CAPILLARY
Glucose-Capillary: 158 mg/dL — ABNORMAL HIGH (ref 70–99)
Glucose-Capillary: 107 mg/dL — ABNORMAL HIGH (ref 70–99)

## 2014-10-28 NOTE — Progress Notes (Signed)
Pt started cardiac rehab today.  Pt tolerated light exercise without difficulty. VSS, telemetry-sinus rhythm. Pt c/o leg fatigue, pain and weakness with walking track associated with unsteady gait.  Pt encouraged to use walker for safety. Pt states he is scheduled for doppler studies this week to evaluate per Dr. Fletcher Anon.  Pt quality of life scores are low in health/functioning mostly associated with his decline in physical ability associated with his symptoms. Pt continues to have chest pain and he reports he has been instructed to use NTG SL 1 every 5 minutes up to total of 6.  Pt reports occasional chest discomfort with no recent SL NTG use.  Otherwise pt has good coping skills and supportive family.  Pt goals are to increase stamina with decreased chest pain.  Pt would like to increase his energy level.  Pt oriented to exercise equipment and routine.  Understanding verbalized.

## 2014-10-30 ENCOUNTER — Encounter (HOSPITAL_COMMUNITY)
Admission: RE | Admit: 2014-10-30 | Discharge: 2014-10-30 | Disposition: A | Discharge status: Home / Self Care | Payer: Medicare Other | Source: Ambulatory Visit | Attending: Interventional Cardiology | Admitting: Interventional Cardiology

## 2014-10-30 DIAGNOSIS — I252 Old myocardial infarction: Secondary | ICD-10-CM | POA: Diagnosis not present

## 2014-10-30 DIAGNOSIS — Z5189 Encounter for other specified aftercare: Secondary | ICD-10-CM | POA: Diagnosis not present

## 2014-10-30 DIAGNOSIS — Z951 Presence of aortocoronary bypass graft: Secondary | ICD-10-CM | POA: Diagnosis not present

## 2014-10-30 DIAGNOSIS — E1151 Type 2 diabetes mellitus with diabetic peripheral angiopathy without gangrene: Secondary | ICD-10-CM | POA: Diagnosis not present

## 2014-10-30 LAB — GLUCOSE, CAPILLARY
Glucose-Capillary: 140 mg/dL — ABNORMAL HIGH (ref 70–99)
Glucose-Capillary: 165 mg/dL — ABNORMAL HIGH (ref 70–99)

## 2014-10-31 ENCOUNTER — Ambulatory Visit: Payer: Self-pay | Admitting: Interventional Cardiology

## 2014-10-31 ENCOUNTER — Other Ambulatory Visit (HOSPITAL_COMMUNITY): Payer: Self-pay | Admitting: Cardiology

## 2014-10-31 ENCOUNTER — Ambulatory Visit (HOSPITAL_BASED_OUTPATIENT_CLINIC_OR_DEPARTMENT_OTHER): Payer: Medicare Other | Admitting: *Deleted

## 2014-10-31 ENCOUNTER — Ambulatory Visit (HOSPITAL_COMMUNITY): Payer: Medicare Other | Attending: Cardiovascular Disease | Admitting: *Deleted

## 2014-10-31 DIAGNOSIS — I739 Peripheral vascular disease, unspecified: Secondary | ICD-10-CM | POA: Diagnosis not present

## 2014-10-31 DIAGNOSIS — I2510 Atherosclerotic heart disease of native coronary artery without angina pectoris: Secondary | ICD-10-CM | POA: Diagnosis not present

## 2014-10-31 DIAGNOSIS — Z87891 Personal history of nicotine dependence: Secondary | ICD-10-CM | POA: Insufficient documentation

## 2014-10-31 DIAGNOSIS — J449 Chronic obstructive pulmonary disease, unspecified: Secondary | ICD-10-CM | POA: Insufficient documentation

## 2014-10-31 DIAGNOSIS — E119 Type 2 diabetes mellitus without complications: Secondary | ICD-10-CM | POA: Diagnosis not present

## 2014-10-31 DIAGNOSIS — I10 Essential (primary) hypertension: Secondary | ICD-10-CM | POA: Insufficient documentation

## 2014-10-31 DIAGNOSIS — E785 Hyperlipidemia, unspecified: Secondary | ICD-10-CM | POA: Diagnosis not present

## 2014-10-31 NOTE — Progress Notes (Signed)
Aorta Duplex Performed

## 2014-10-31 NOTE — Progress Notes (Signed)
Arterial Doppler Lower Multilevel Performed Arterial Duplex Lower Bilateral Performed

## 2014-11-01 ENCOUNTER — Encounter (HOSPITAL_COMMUNITY)
Admission: RE | Admit: 2014-11-01 | Discharge: 2014-11-01 | Disposition: A | Discharge status: Home / Self Care | Payer: Medicare Other | Source: Ambulatory Visit | Attending: Interventional Cardiology | Admitting: Interventional Cardiology

## 2014-11-01 DIAGNOSIS — I252 Old myocardial infarction: Secondary | ICD-10-CM | POA: Diagnosis not present

## 2014-11-01 DIAGNOSIS — Z5189 Encounter for other specified aftercare: Secondary | ICD-10-CM | POA: Diagnosis not present

## 2014-11-01 DIAGNOSIS — E1151 Type 2 diabetes mellitus with diabetic peripheral angiopathy without gangrene: Secondary | ICD-10-CM | POA: Diagnosis not present

## 2014-11-01 DIAGNOSIS — Z951 Presence of aortocoronary bypass graft: Secondary | ICD-10-CM | POA: Diagnosis not present

## 2014-11-01 LAB — GLUCOSE, CAPILLARY
Glucose-Capillary: 198 mg/dL — ABNORMAL HIGH (ref 70–99)
Glucose-Capillary: 232 mg/dL — ABNORMAL HIGH (ref 70–99)

## 2014-11-04 ENCOUNTER — Encounter (HOSPITAL_COMMUNITY): Payer: Medicare Other

## 2014-11-06 ENCOUNTER — Encounter (HOSPITAL_COMMUNITY)
Admission: RE | Admit: 2014-11-06 | Discharge: 2014-11-06 | Disposition: A | Discharge status: Home / Self Care | Payer: Medicare Other | Source: Ambulatory Visit | Attending: Interventional Cardiology | Admitting: Interventional Cardiology

## 2014-11-06 DIAGNOSIS — Z951 Presence of aortocoronary bypass graft: Secondary | ICD-10-CM | POA: Diagnosis not present

## 2014-11-06 DIAGNOSIS — I252 Old myocardial infarction: Secondary | ICD-10-CM | POA: Diagnosis not present

## 2014-11-06 DIAGNOSIS — Z5189 Encounter for other specified aftercare: Secondary | ICD-10-CM | POA: Diagnosis not present

## 2014-11-06 DIAGNOSIS — E1151 Type 2 diabetes mellitus with diabetic peripheral angiopathy without gangrene: Secondary | ICD-10-CM | POA: Diagnosis not present

## 2014-11-06 LAB — GLUCOSE, CAPILLARY: Glucose-Capillary: 171 mg/dL — ABNORMAL HIGH (ref 70–99)

## 2014-11-08 ENCOUNTER — Encounter (HOSPITAL_COMMUNITY): Payer: Medicare Other

## 2014-11-11 ENCOUNTER — Encounter (HOSPITAL_COMMUNITY): Payer: Medicare Other

## 2014-11-13 ENCOUNTER — Encounter (HOSPITAL_COMMUNITY): Payer: Medicare Other

## 2014-11-15 ENCOUNTER — Encounter (HOSPITAL_COMMUNITY): Payer: Medicare Other

## 2014-11-15 DIAGNOSIS — N183 Chronic kidney disease, stage 3 (moderate): Secondary | ICD-10-CM | POA: Diagnosis not present

## 2014-11-15 DIAGNOSIS — E1165 Type 2 diabetes mellitus with hyperglycemia: Secondary | ICD-10-CM | POA: Diagnosis not present

## 2014-11-15 DIAGNOSIS — I739 Peripheral vascular disease, unspecified: Secondary | ICD-10-CM | POA: Diagnosis not present

## 2014-11-15 DIAGNOSIS — E780 Pure hypercholesterolemia: Secondary | ICD-10-CM | POA: Diagnosis not present

## 2014-11-15 DIAGNOSIS — I5022 Chronic systolic (congestive) heart failure: Secondary | ICD-10-CM | POA: Diagnosis not present

## 2014-11-15 DIAGNOSIS — I10 Essential (primary) hypertension: Secondary | ICD-10-CM | POA: Diagnosis not present

## 2014-11-18 ENCOUNTER — Encounter (HOSPITAL_COMMUNITY)
Admission: RE | Admit: 2014-11-18 | Discharge: 2014-11-18 | Disposition: A | Discharge status: Home / Self Care | Payer: Medicare Other | Source: Ambulatory Visit | Attending: Interventional Cardiology | Admitting: Interventional Cardiology

## 2014-11-18 DIAGNOSIS — Z48812 Encounter for surgical aftercare following surgery on the circulatory system: Secondary | ICD-10-CM | POA: Insufficient documentation

## 2014-11-18 DIAGNOSIS — Z7982 Long term (current) use of aspirin: Secondary | ICD-10-CM | POA: Diagnosis not present

## 2014-11-18 DIAGNOSIS — Z79899 Other long term (current) drug therapy: Secondary | ICD-10-CM | POA: Insufficient documentation

## 2014-11-18 DIAGNOSIS — Z951 Presence of aortocoronary bypass graft: Secondary | ICD-10-CM | POA: Insufficient documentation

## 2014-11-18 DIAGNOSIS — I252 Old myocardial infarction: Secondary | ICD-10-CM | POA: Diagnosis not present

## 2014-11-18 LAB — GLUCOSE, CAPILLARY
GLUCOSE-CAPILLARY: 156 mg/dL — AB (ref 70–99)
GLUCOSE-CAPILLARY: 111 mg/dL — AB (ref 70–99)

## 2014-11-19 ENCOUNTER — Encounter: Payer: Self-pay | Admitting: Cardiovascular Disease

## 2014-11-19 ENCOUNTER — Telehealth: Payer: Self-pay | Admitting: Cardiovascular Disease

## 2014-11-19 ENCOUNTER — Ambulatory Visit (INDEPENDENT_AMBULATORY_CARE_PROVIDER_SITE_OTHER): Payer: Medicare Other | Admitting: Cardiovascular Disease

## 2014-11-19 ENCOUNTER — Other Ambulatory Visit: Payer: Self-pay

## 2014-11-19 VITALS — BP 115/50 | HR 62 | Ht 66.0 in | Wt 160.0 lb

## 2014-11-19 DIAGNOSIS — N183 Chronic kidney disease, stage 3 unspecified: Secondary | ICD-10-CM

## 2014-11-19 DIAGNOSIS — I739 Peripheral vascular disease, unspecified: Secondary | ICD-10-CM | POA: Diagnosis not present

## 2014-11-19 DIAGNOSIS — I2510 Atherosclerotic heart disease of native coronary artery without angina pectoris: Secondary | ICD-10-CM

## 2014-11-19 LAB — CBC
HEMATOCRIT: 31.9 % — AB (ref 39.0–52.0)
Hemoglobin: 10.6 g/dL — ABNORMAL LOW (ref 13.0–17.0)
MCHC: 33.2 g/dL (ref 30.0–36.0)
MCV: 72.1 fl — ABNORMAL LOW (ref 78.0–100.0)
Platelets: 253 10*3/uL (ref 150.0–400.0)
RBC: 4.42 Mil/uL (ref 4.22–5.81)
RDW: 17.2 % — ABNORMAL HIGH (ref 11.5–15.5)
WBC: 9.9 10*3/uL (ref 4.0–10.5)

## 2014-11-19 LAB — PROTIME-INR
INR: 1.1 ratio — ABNORMAL HIGH (ref 0.8–1.0)
PROTHROMBIN TIME: 12 s (ref 9.6–13.1)

## 2014-11-19 LAB — BASIC METABOLIC PANEL
BUN: 116 mg/dL (ref 6–23)
CALCIUM: 9.8 mg/dL (ref 8.4–10.5)
CO2: 25 mEq/L (ref 19–32)
Chloride: 99 mEq/L (ref 96–112)
Creatinine, Ser: 2.56 mg/dL — ABNORMAL HIGH (ref 0.40–1.50)
GFR: 26.3 mL/min — ABNORMAL LOW (ref >60.00)
GLUCOSE: 211 mg/dL — AB (ref 70–99)
POTASSIUM: 4.6 meq/L (ref 3.5–5.1)
Sodium: 134 mEq/L — ABNORMAL LOW (ref 135–145)

## 2014-11-19 MED ORDER — CLOPIDOGREL BISULFATE 75 MG PO TABS: 75.0000 mg | ORAL_TABLET | Freq: Every day | ORAL | Status: DC

## 2014-11-19 MED ORDER — TORSEMIDE 20 MG PO TABS: ORAL_TABLET | ORAL | Status: DC

## 2014-11-19 MED ORDER — ASPIRIN 81 MG PO TBEC: 81.0000 mg | DELAYED_RELEASE_TABLET | Freq: Every day | ORAL | Status: DC

## 2014-11-19 MED ORDER — METOPROLOL TARTRATE 50 MG PO TABS: 50.0000 mg | ORAL_TABLET | Freq: Two times a day (BID) | ORAL | Status: DC

## 2014-11-19 MED ORDER — PANTOPRAZOLE SODIUM 40 MG PO TBEC: 40.0000 mg | DELAYED_RELEASE_TABLET | Freq: Every day | ORAL | Status: DC

## 2014-11-19 NOTE — Progress Notes (Signed)
Primary cardiologist: Dr. Tamala Julian  HPI  This is a pleasant 74 year old man who is here today for a follow up visit regarding  peripheral arterial disease. He has extensive medical problems that include CAD (CABG in 1999 with multiple PCIs since, repeat CABG 02/2014, HLD, OSA, CHF (EF 45-50% in 08/2014 with grade II DD, >55% by echo 09/2014 at [facility]), T2DM, COPD, OSA (doesn't wear mask), stage III-IV CKD, polymyalgia rheumatica, PVD including carotid stenosis (moderate by duplex 12/2013, remote LE stenting) and anemia.  2D echo 08/26/14: EF 45-50%, akinesis inferiorly, grade 2 d/d, mild MR, no sig change from prior. LDL 64.  He has prolonged history of peripheral arterial disease with previous iliac stenting in 2001 done by Dr. Amedeo Plenty. He is known to have bilateral SFA occlusions. He reports bilateral calf claudication which is equal in both sides after walking about 50 yards. He has to rest for about 5-10 minutes before he can resume . His symptoms have been stable for a few years. He quit smoking in May 2015. No rest pain or lower extremity ulceration. He does have chronic kidney disease with Creatinine around 2.  Noninvasive evaluation showed an ABI in 0.4 range bilaterally with chronic SFA occlusion and severe bilateral external iliac artery stenosis.   No Known Allergies   Current Outpatient Prescriptions on File Prior to Visit  Medication Sig Dispense Refill  . acetaminophen (TYLENOL) 325 MG tablet Take 650 mg by mouth every 6 (six) hours as needed for moderate pain.    Marland Kitchen amLODipine (NORVASC) 10 MG tablet Take 10 mg by mouth daily.    Marland Kitchen atorvastatin (LIPITOR) 20 MG tablet Take 20 mg by mouth daily at 6 PM.     . cholecalciferol (VITAMIN D) 1000 UNITS tablet Take 1,000 Units by mouth daily.    Marland Kitchen glipiZIDE (GLUCOTROL) 10 MG tablet Take 1 tablet (10 mg total) by mouth 2 (two) times daily before a meal. 60 tablet 0  . hydrALAZINE (APRESOLINE) 50 MG tablet Take 1.5 tablets (75 mg total) by mouth 3  (three) times daily. 140 tablet 0  . Insulin Glargine (LANTUS) 100 UNIT/ML Solostar Pen Inject 10 Units into the skin every morning. 15 mL 1  . Insulin Pen Needle (FIFTY50 PEN NEEDLES) 31G X 5 MM MISC As directed 30 each 1  . isosorbide mononitrate (IMDUR) 120 MG 24 hr tablet Take 1 tablet (120 mg total) by mouth daily. 30 tablet 5  . nitroGLYCERIN (NITROSTAT) 0.4 MG SL tablet Place 0.4 mg under the tongue every 5 (five) minutes as needed for chest pain.    Marland Kitchen torsemide (DEMADEX) 20 MG tablet Take 40 mg by mouth 2 (two) times daily.      No current facility-administered medications on file prior to visit.     Past Medical History  Diagnosis Date  . Carotid artery occlusion     a. Duplex 12/2013: mild plaque bilat, 40-59% BICA. F/u due 12/2014.  Marland Kitchen Hyperlipidemia   . Atherosclerosis of native arteries of the extremities with intermittent claudication     a. L external iliac stent 2001, bilateral SFA occlusions documented 2001.  Marland Kitchen Hypertension   . GERD (gastroesophageal reflux disease)   . Esophageal stricture     a. History of dilitation - Long-standing history of esophageal reflux   . 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 1999: LIMA to LAD, sequential SVG to diagonal 2 and diagonal 3, sequential SVG to PDA, acute marginal branch, and PL branch. b. Repeat CABG 2015 with SVG  to D1, SVG to PDA, and SVG to OM.   Marland Kitchen CAD (coronary artery disease), native coronary artery 09/24/2013    a. CABG x 6 1999. b. Interim PCIs: Angioplasty SVG-PDA 2006.Taxus DES to native LCx in 2007.DES to SVG-PDA 2007, restented 2009.NSTEMI with PCI to SVG- PDA complicated by no-reflow/inferior infarction in 2012. c. Redo CABG 2015 with SVG-PD, SVG-D1, SVG-OM1.  . Chronic combined systolic and diastolic CHF (congestive heart failure)     a. Prior EF 45-50% in 08/2014. b. Improved EF >55% in 09/2014 at [facility].  . MI (myocardial infarction) 1999-02/2014  . OSA (obstructive sleep apnea)     "don't wear mask" (08/26/2014)  . Type II  diabetes mellitus   . Anemia     a. + FOBT 08/2014.  Marland Kitchen History of hiatal hernia   . Daily headache   . Arthritis   . Chronic lower back pain   . CKD (chronic kidney disease) stage 3, GFR 30-59 ml/min   . Skin cancer   . Hypotonic bladder     Requiring in and out catheterization performed by the patient at home - occasional blood tinged urine (self-cath's daily for the last 2 years) and hematuria is chronic for him, followed by urology.  . Polymyalgia rheumatica   . SVT (supraventricular tachycardia)   . COPD (chronic obstructive pulmonary disease)      Past Surgical History  Procedure Laterality Date  . Coronary stent placement      "I've had 8 put in; I presently have none" (08/26/2014)  . Cardiac catheterization    . Intraoperative transesophageal echocardiogram N/A 03/01/2014    Procedure: INTRAOPERATIVE TRANSESOPHAGEAL ECHOCARDIOGRAM;  Surgeon: Gaye Pollack, MD;  Location: [facility] OR;  Service: Open Heart Surgery;  Laterality: N/A;  . Anterior cervical decomp/discectomy fusion    . Ankle fracture surgery Right ~ 1977    "10 plates and 7 screws placed in it"  . Tonsillectomy    . Fracture surgery    . Cataract extraction w/ intraocular lens  implant, bilateral Bilateral 2000's  . Mohs surgery  X 2    "nose; nose"  . Skin cancer excision      "all over my head; ears; back  . Esophagogastroduodenoscopy (egd) with esophageal dilation  X 1  . Back surgery      neck surgery  . Coronary artery bypass graft  1999  . Coronary artery bypass graft N/A 03/01/2014    Procedure: REDO CORONARY ARTERY BYPASS GRAFTING TIMES THREE, USING RIGHT GREATER SAPHENOUS VEIN VIA ENDOVEIN HARVEST.;  Surgeon: Gaye Pollack, MD;  Location: [facility] OR;  Service: Open Heart Surgery;  Laterality: N/A;  . Left heart catheterization with coronary/graft angiogram  02/26/2014    Procedure: LEFT HEART CATHETERIZATION WITH Beatrix Fetters;  Surgeon: Sinclair Grooms, MD;  Location: [facility] CATH LAB;  Service:  Cardiovascular;;  . Left heart catheterization with coronary/graft angiogram N/A 07/23/2014    Procedure: LEFT HEART CATHETERIZATION WITH Beatrix Fetters;  Surgeon: Sinclair Grooms, MD;  Location: [facility] CATH LAB;  Service: Cardiovascular;  Laterality: N/A;  . Left heart catheterization with coronary/graft angiogram N/A 08/30/2014    Procedure: LEFT HEART CATHETERIZATION WITH Beatrix Fetters;  Surgeon: Leonie Man, MD;  Location: [facility] CATH LAB;  Service: Cardiovascular;  Laterality: N/A;     Family History  Problem Relation Age of Onset  . CAD Mother   . Cirrhosis Brother      History   Social History  . Marital Status: Married  Spouse Name: N/A    Number of Children: N/A  . Years of Education: N/A   Occupational History  . Not on file.   Social History Main Topics  . Smoking status: Former Smoker -- 1.00 packs/day for 50 years    Types: Cigarettes    Quit date: 02/25/2014  . Smokeless tobacco: Never Used  . Alcohol Use: No  . Drug Use: No  . Sexual Activity: Not on file   Other Topics Concern  . Not on file   Social History Narrative   Tour manager            ROS A 10 point review of system was performed. It is negative other than that mentioned in the history of present illness.   PHYSICAL EXAM   BP 115/50 mmHg  Pulse 62  Ht 5\' 6"  (1.676 m)  Wt 160 lb (72.576 kg)  BMI 25.84 kg/m2  SpO2 97% Constitutional: He is oriented to person, place, and time. He appears well-developed and well-nourished. No distress.  HENT: No nasal discharge.  Head: Normocephalic and atraumatic.  Eyes: Pupils are equal and round.  No discharge. Neck: Normal range of motion. Neck supple. No JVD present. No thyromegaly present.  Cardiovascular: Normal rate, regular rhythm, normal heart sounds. Exam reveals no gallop and no friction rub. No murmur heard.  Pulmonary/Chest: Effort normal and breath sounds normal. No stridor. No respiratory distress.  He has no wheezes. He has no rales. He exhibits no tenderness.  Abdominal: Soft. Bowel sounds are normal. He exhibits no distension. There is no tenderness. There is no rebound and no guarding.  Musculoskeletal: Normal range of motion. He exhibits no edema and no tenderness.  Neurological: He is alert and oriented to person, place, and time. Coordination normal.  Skin: Skin is warm and dry. No rash noted. He is not diaphoretic. No erythema. No pallor.  Psychiatric: He has a normal mood and affect. His behavior is normal. Judgment and thought content normal.  Vascular: Femoral pulse: +1 on the right side and absent on the left side, distal pulses are not palpable        ASSESSMENT AND PLAN

## 2014-11-19 NOTE — Assessment & Plan Note (Signed)
The patient had a drop in heart rate to 46 beats per minutes yesterday at cardiac rehabilitation during cool down. This was associated with dizziness. Due to that, I decreased the dose of metoprolol to 50 mg twice daily.

## 2014-11-19 NOTE — Telephone Encounter (Signed)
Please review. Thanks!  

## 2014-11-19 NOTE — Telephone Encounter (Signed)
[facility] called asking about medication that was sent in. On Asprin, she needs clearification on package and doesage. Please call

## 2014-11-19 NOTE — Patient Instructions (Addendum)
Your physician has recommended you make the following change in your medication:  1. DECREASE Aspirin to 81mg  once a day 2. START Plavix 75mg  take one by mouth daily 3. STOP Omeprazole 4. START Pantoprazole 40mg  take one by mouth daily 5. DECREASE Metoprolol Tartrate to 50mg  take one by mouth twice a day  Your physician recommends that you have lab work today: BMP, CBC and PT/INR  Your physician has requested that you have a peripheral vascular angiogram. This exam is performed at the hospital. During this exam IV contrast is used to look at arterial blood flow. Please review the information sheet given for details.

## 2014-11-19 NOTE — Assessment & Plan Note (Signed)
The patient has severe bilateral lifestyle limiting claudication with multivessel disease. Rutherford class 3. He has known chronic occluded SFAs bilaterally and now with inflow disease affecting bilateral external iliac arteries. He is at risk for progression to critical limb ischemia. He does complain of bilateral feet pain at night with burning sensation which is likely due to diabetic neuropathy. Nonetheless, he clearly has severe claudication with very short distance of walking. Due to that, I recommend proceeding with abdominal aortogram with possible endovascular intervention. The plan is to use CO2 given chronic kidney disease. I will also bring him early for hydration. Contrast will be required during the procedure and this was explained to the patient and his wife. There is always a risk of worsening renal function and the small possibility of requiring dialysis. I also discussed other associated risks of the procedure and the patient understands and willing to proceed. Start Plavix today and decrease aspirin to 81 mg once daily.

## 2014-11-19 NOTE — Telephone Encounter (Signed)
I spoke with the pharmacy and made them aware to disregard ASA order.  The pt will be getting this OTC.

## 2014-11-20 ENCOUNTER — Encounter (HOSPITAL_COMMUNITY): Payer: Medicare Other

## 2014-11-20 DIAGNOSIS — J209 Acute bronchitis, unspecified: Secondary | ICD-10-CM | POA: Diagnosis not present

## 2014-11-20 DIAGNOSIS — E1165 Type 2 diabetes mellitus with hyperglycemia: Secondary | ICD-10-CM | POA: Diagnosis not present

## 2014-11-20 DIAGNOSIS — R509 Fever, unspecified: Secondary | ICD-10-CM | POA: Diagnosis not present

## 2014-11-22 ENCOUNTER — Other Ambulatory Visit (INDEPENDENT_AMBULATORY_CARE_PROVIDER_SITE_OTHER): Payer: Medicare Other | Admitting: *Deleted

## 2014-11-22 ENCOUNTER — Telehealth: Payer: Self-pay | Admitting: Cardiovascular Disease

## 2014-11-22 ENCOUNTER — Encounter (HOSPITAL_COMMUNITY): Payer: Medicare Other

## 2014-11-22 DIAGNOSIS — N183 Chronic kidney disease, stage 3 unspecified: Secondary | ICD-10-CM

## 2014-11-22 DIAGNOSIS — I739 Peripheral vascular disease, unspecified: Secondary | ICD-10-CM

## 2014-11-22 LAB — BASIC METABOLIC PANEL
BUN: 88 mg/dL — AB (ref 6–23)
CALCIUM: 9.6 mg/dL (ref 8.4–10.5)
CHLORIDE: 102 meq/L (ref 96–112)
CO2: 21 mEq/L (ref 19–32)
Creatinine, Ser: 2.58 mg/dL — ABNORMAL HIGH (ref 0.40–1.50)
GFR: 26.06 mL/min — ABNORMAL LOW (ref >60.00)
Glucose, Bld: 221 mg/dL — ABNORMAL HIGH (ref 70–99)
Potassium: 5 mEq/L (ref 3.5–5.1)
Sodium: 134 mEq/L — ABNORMAL LOW (ref 135–145)

## 2014-11-22 NOTE — Telephone Encounter (Signed)
Follow Up         Pt calling back to find out results of blood work. Please call back.

## 2014-11-22 NOTE — Telephone Encounter (Signed)
Truitt Merle NP reviewed lab results and advised that the pt continue to hold Torsemide.  If the pt is taking Avapro then the pt should hold this medication too. The pt should have repeat BMP on Monday.  I spoke with the pt and made him aware of lab results. Advised the pt to continue to hold torsemide. The pt is taking Avapro 150mg  once a day.  I advised the pt to hold this medication too and repeat lab on 11/25/14. The pt continues to weigh himself daily and has gained less than 1 lb since holding Torsemide.  I instructed the pt to continue to weigh daily and contact the office over the weekend with any questions or concerns. Pt agreed with plan.

## 2014-11-25 ENCOUNTER — Encounter (HOSPITAL_COMMUNITY): Admission: RE | Admit: 2014-11-25 | Payer: Medicare Other | Source: Ambulatory Visit

## 2014-11-25 ENCOUNTER — Other Ambulatory Visit (INDEPENDENT_AMBULATORY_CARE_PROVIDER_SITE_OTHER): Payer: Medicare Other | Admitting: *Deleted

## 2014-11-25 DIAGNOSIS — I739 Peripheral vascular disease, unspecified: Secondary | ICD-10-CM

## 2014-11-25 LAB — BASIC METABOLIC PANEL
BUN: 55 mg/dL — ABNORMAL HIGH (ref 6–23)
CO2: 21 mEq/L (ref 19–32)
Calcium: 9.6 mg/dL (ref 8.4–10.5)
Chloride: 107 mEq/L (ref 96–112)
Creatinine, Ser: 1.93 mg/dL — ABNORMAL HIGH (ref 0.40–1.50)
GFR: 36.43 mL/min — AB (ref >60.00)
Glucose, Bld: 51 mg/dL — ABNORMAL LOW (ref 70–99)
POTASSIUM: 4.7 meq/L (ref 3.5–5.1)
SODIUM: 136 meq/L (ref 135–145)

## 2014-11-26 MED ORDER — TORSEMIDE 20 MG PO TABS: ORAL_TABLET | ORAL | Status: DC

## 2014-11-26 NOTE — Telephone Encounter (Signed)
I discussed this pt with Dr Fletcher Anon and he would like the pt to restart Torsemide at this time and he can take Torsemide tomorrow morning. Continue to hold Avapro. I spoke with the pt and made him aware of these instructions. The pt will continue with plans for angiogram tomorrow.

## 2014-11-26 NOTE — Telephone Encounter (Signed)
Follow up      Pt is due to have stents tomorrow.  Pt gained 2lbs overnight. He said his labs were "off".  Will he still be able to have his stents tomorrow?

## 2014-11-27 ENCOUNTER — Ambulatory Visit (HOSPITAL_COMMUNITY)
Admission: RE | Admit: 2014-11-27 | Discharge: 2014-11-27 | Disposition: A | Discharge status: Home / Self Care | Payer: Medicare Other | Source: Ambulatory Visit | Attending: Cardiovascular Disease | Admitting: Cardiovascular Disease

## 2014-11-27 ENCOUNTER — Encounter (HOSPITAL_COMMUNITY): Payer: Self-pay | Admitting: Cardiovascular Disease

## 2014-11-27 ENCOUNTER — Encounter (HOSPITAL_COMMUNITY)
Admission: RE | Disposition: A | Discharge status: Home / Self Care | Payer: Self-pay | Source: Ambulatory Visit | Attending: Cardiovascular Disease

## 2014-11-27 ENCOUNTER — Encounter (HOSPITAL_COMMUNITY): Payer: Medicare Other

## 2014-11-27 DIAGNOSIS — Z87891 Personal history of nicotine dependence: Secondary | ICD-10-CM | POA: Insufficient documentation

## 2014-11-27 DIAGNOSIS — Z862 Personal history of diseases of the blood and blood-forming organs and certain disorders involving the immune mechanism: Secondary | ICD-10-CM | POA: Insufficient documentation

## 2014-11-27 DIAGNOSIS — I739 Peripheral vascular disease, unspecified: Secondary | ICD-10-CM | POA: Diagnosis not present

## 2014-11-27 DIAGNOSIS — G8929 Other chronic pain: Secondary | ICD-10-CM | POA: Diagnosis not present

## 2014-11-27 DIAGNOSIS — M545 Low back pain: Secondary | ICD-10-CM | POA: Insufficient documentation

## 2014-11-27 DIAGNOSIS — N183 Chronic kidney disease, stage 3 unspecified: Secondary | ICD-10-CM

## 2014-11-27 DIAGNOSIS — M1990 Unspecified osteoarthritis, unspecified site: Secondary | ICD-10-CM | POA: Diagnosis not present

## 2014-11-27 DIAGNOSIS — E119 Type 2 diabetes mellitus without complications: Secondary | ICD-10-CM | POA: Diagnosis not present

## 2014-11-27 DIAGNOSIS — I509 Heart failure, unspecified: Secondary | ICD-10-CM | POA: Diagnosis not present

## 2014-11-27 DIAGNOSIS — J449 Chronic obstructive pulmonary disease, unspecified: Secondary | ICD-10-CM | POA: Insufficient documentation

## 2014-11-27 DIAGNOSIS — Z794 Long term (current) use of insulin: Secondary | ICD-10-CM | POA: Diagnosis not present

## 2014-11-27 DIAGNOSIS — K219 Gastro-esophageal reflux disease without esophagitis: Secondary | ICD-10-CM | POA: Diagnosis not present

## 2014-11-27 DIAGNOSIS — Z951 Presence of aortocoronary bypass graft: Secondary | ICD-10-CM | POA: Insufficient documentation

## 2014-11-27 DIAGNOSIS — G4733 Obstructive sleep apnea (adult) (pediatric): Secondary | ICD-10-CM | POA: Insufficient documentation

## 2014-11-27 DIAGNOSIS — I70213 Atherosclerosis of native arteries of extremities with intermittent claudication, bilateral legs: Secondary | ICD-10-CM | POA: Diagnosis not present

## 2014-11-27 DIAGNOSIS — I70219 Atherosclerosis of native arteries of extremities with intermittent claudication, unspecified extremity: Secondary | ICD-10-CM | POA: Insufficient documentation

## 2014-11-27 DIAGNOSIS — E785 Hyperlipidemia, unspecified: Secondary | ICD-10-CM | POA: Insufficient documentation

## 2014-11-27 DIAGNOSIS — I129 Hypertensive chronic kidney disease with stage 1 through stage 4 chronic kidney disease, or unspecified chronic kidney disease: Secondary | ICD-10-CM | POA: Diagnosis not present

## 2014-11-27 DIAGNOSIS — I252 Old myocardial infarction: Secondary | ICD-10-CM | POA: Insufficient documentation

## 2014-11-27 DIAGNOSIS — Z85828 Personal history of other malignant neoplasm of skin: Secondary | ICD-10-CM | POA: Insufficient documentation

## 2014-11-27 DIAGNOSIS — Z79899 Other long term (current) drug therapy: Secondary | ICD-10-CM | POA: Insufficient documentation

## 2014-11-27 DIAGNOSIS — I2510 Atherosclerotic heart disease of native coronary artery without angina pectoris: Secondary | ICD-10-CM | POA: Insufficient documentation

## 2014-11-27 HISTORY — PX: ABDOMINAL AORTAGRAM: SHX5454

## 2014-11-27 LAB — GLUCOSE, CAPILLARY
Glucose-Capillary: 103 mg/dL — ABNORMAL HIGH (ref 70–99)
Glucose-Capillary: 78 mg/dL (ref 70–99)

## 2014-11-27 SURGERY — ABDOMINAL AORTAGRAM

## 2014-11-27 MED ORDER — LIDOCAINE HCL (PF) 1 % IJ SOLN
INTRAMUSCULAR | Status: AC
  Filled 2014-11-27: qty 30

## 2014-11-27 MED ORDER — FENTANYL CITRATE 0.05 MG/ML IJ SOLN
INTRAMUSCULAR | Status: AC
  Filled 2014-11-27: qty 2

## 2014-11-27 MED ORDER — TORSEMIDE 20 MG PO TABS: ORAL_TABLET | ORAL | Status: DC

## 2014-11-27 MED ORDER — SODIUM CHLORIDE 0.9 % IV SOLN
INTRAVENOUS | Status: DC
  Administered 2014-11-27: 07:00:00 via INTRAVENOUS

## 2014-11-27 MED ORDER — SODIUM CHLORIDE 0.9 % IV SOLN: INTRAVENOUS | Status: DC

## 2014-11-27 MED ORDER — HEPARIN (PORCINE) IN NACL 2-0.9 UNIT/ML-% IJ SOLN
INTRAMUSCULAR | Status: AC
  Filled 2014-11-27: qty 1000

## 2014-11-27 MED ORDER — MIDAZOLAM HCL 2 MG/2ML IJ SOLN
INTRAMUSCULAR | Status: AC
  Filled 2014-11-27: qty 2

## 2014-11-27 MED ORDER — SODIUM CHLORIDE 0.9 % IJ SOLN: 3.0000 mL | INTRAMUSCULAR | Status: DC | PRN

## 2014-11-27 MED ORDER — ASPIRIN 81 MG PO CHEW: 81.0000 mg | CHEWABLE_TABLET | ORAL | Status: DC

## 2014-11-27 MED ORDER — SODIUM CHLORIDE 0.9 % IJ SOLN: 3.0000 mL | Freq: Two times a day (BID) | INTRAMUSCULAR | Status: DC

## 2014-11-27 MED ORDER — SODIUM CHLORIDE 0.9 % IV SOLN: 250.0000 mL | INTRAVENOUS | Status: DC | PRN

## 2014-11-27 NOTE — Interval H&P Note (Signed)
History and Physical Interval Note:  11/27/2014 12:57 PM  [name]  has presented today for surgery, with the diagnosis of pad  The various methods of treatment have been discussed with the patient and family. After consideration of risks, benefits and other options for treatment, the patient has consented to  Procedure(s): ABDOMINAL AORTAGRAM (N/A) as a surgical intervention .  The patient's history has been reviewed, patient examined, no change in status, stable for surgery.  I have reviewed the patient's chart and labs.  Questions were answered to the patient's satisfaction.     Kathlyn Sacramento

## 2014-11-27 NOTE — CV Procedure (Signed)
5 Fr sheath was pulled manually from  the R FA, and pressure was held for 20 min. Vital signs were stable and the patient tolerated the procedure well. Post sheath pull instructions were given.

## 2014-11-27 NOTE — H&P (View-Only) (Signed)
Primary cardiologist: Dr. Tamala Julian  HPI  This is a pleasant 74 year old man who is here today for a follow up visit regarding  peripheral arterial disease. He has extensive medical problems that include CAD (CABG in 1999 with multiple PCIs since, repeat CABG 02/2014, HLD, OSA, CHF (EF 45-50% in 08/2014 with grade II DD, >55% by echo 09/2014 at  [facility]), T2DM, COPD, OSA (doesn't wear mask), stage III-IV CKD, polymyalgia rheumatica, PVD including carotid stenosis (moderate by duplex 12/2013, remote LE stenting) and anemia.  2D echo 08/26/14: EF 45-50%, akinesis inferiorly, grade 2 d/d, mild MR, no sig change from prior. LDL 64.  He has prolonged history of peripheral arterial disease with previous iliac stenting in 2001 done by Dr. Amedeo Plenty. He is known to have bilateral SFA occlusions. He reports bilateral calf claudication which is equal in both sides after walking about 50 yards. He has to rest for about 5-10 minutes before he can resume . His symptoms have been stable for a few years. He quit smoking in May 2015. No rest pain or lower extremity ulceration. He does have chronic kidney disease with Creatinine around 2.  Noninvasive evaluation showed an ABI in 0.4 range bilaterally with chronic SFA occlusion and severe bilateral external iliac artery stenosis.   No Known Allergies   Current Outpatient Prescriptions on File Prior to Visit  Medication Sig Dispense Refill  . acetaminophen (TYLENOL) 325 MG tablet Take 650 mg by mouth every 6 (six) hours as needed for moderate pain.    Marland Kitchen amLODipine (NORVASC) 10 MG tablet Take 10 mg by mouth daily.    Marland Kitchen atorvastatin (LIPITOR) 20 MG tablet Take 20 mg by mouth daily at 6 PM.     . cholecalciferol (VITAMIN D) 1000 UNITS tablet Take 1,000 Units by mouth daily.    Marland Kitchen glipiZIDE (GLUCOTROL) 10 MG tablet Take 1 tablet (10 mg total) by mouth 2 (two) times daily before a meal. 60 tablet 0  . hydrALAZINE (APRESOLINE) 50 MG tablet Take 1.5 tablets (75 mg total) by mouth 3  (three) times daily. 140 tablet 0  . Insulin Glargine (LANTUS) 100 UNIT/ML Solostar Pen Inject 10 Units into the skin every morning. 15 mL 1  . Insulin Pen Needle (FIFTY50 PEN NEEDLES) 31G X 5 MM MISC As directed 30 each 1  . isosorbide mononitrate (IMDUR) 120 MG 24 hr tablet Take 1 tablet (120 mg total) by mouth daily. 30 tablet 5  . nitroGLYCERIN (NITROSTAT) 0.4 MG SL tablet Place 0.4 mg under the tongue every 5 (five) minutes as needed for chest pain.    Marland Kitchen torsemide (DEMADEX) 20 MG tablet Take 40 mg by mouth 2 (two) times daily.      No current facility-administered medications on file prior to visit.     Past Medical History  Diagnosis Date  . Carotid artery occlusion     a. Duplex 12/2013: mild plaque bilat, 40-59% BICA. F/u due 12/2014.  Marland Kitchen Hyperlipidemia   . Atherosclerosis of native arteries of the extremities with intermittent claudication     a. L external iliac stent 2001, bilateral SFA occlusions documented 2001.  Marland Kitchen Hypertension   . GERD (gastroesophageal reflux disease)   . Esophageal stricture     a. History of dilitation - Long-standing history of esophageal reflux   . 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 1999: LIMA to LAD, sequential SVG to diagonal 2 and diagonal 3, sequential SVG to PDA, acute marginal branch, and PL branch. b. Repeat CABG 2015 with SVG  to D1, SVG to PDA, and SVG to OM.   Marland Kitchen CAD (coronary artery disease), native coronary artery 09/24/2013    a. CABG x 6 1999. b. Interim PCIs: Angioplasty SVG-PDA 2006.Taxus DES to native LCx in 2007.DES to SVG-PDA 2007, restented 2009.NSTEMI with PCI to SVG- PDA complicated by no-reflow/inferior infarction in 2012. c. Redo CABG 2015 with SVG-PD, SVG-D1, SVG-OM1.  . Chronic combined systolic and diastolic CHF (congestive heart failure)     a. Prior EF 45-50% in 08/2014. b. Improved EF >55% in 09/2014 at [facility].  . MI (myocardial infarction) 1999-02/2014  . OSA (obstructive sleep apnea)     "don't wear mask" (08/26/2014)  . Type II  diabetes mellitus   . Anemia     a. + FOBT 08/2014.  Marland Kitchen History of hiatal hernia   . Daily headache   . Arthritis   . Chronic lower back pain   . CKD (chronic kidney disease) stage 3, GFR 30-59 ml/min   . Skin cancer   . Hypotonic bladder     Requiring in and out catheterization performed by the patient at home - occasional blood tinged urine (self-cath's daily for the last 2 years) and hematuria is chronic for him, followed by urology.  . Polymyalgia rheumatica   . SVT (supraventricular tachycardia)   . COPD (chronic obstructive pulmonary disease)      Past Surgical History  Procedure Laterality Date  . Coronary stent placement      "I've had 8 put in; I presently have none" (08/26/2014)  . Cardiac catheterization    . Intraoperative transesophageal echocardiogram N/A 03/01/2014    Procedure: INTRAOPERATIVE TRANSESOPHAGEAL ECHOCARDIOGRAM;  Surgeon: Gaye Pollack, MD;  Location: [facility] OR;  Service: Open Heart Surgery;  Laterality: N/A;  . Anterior cervical decomp/discectomy fusion    . Ankle fracture surgery Right ~ 1977    "10 plates and 7 screws placed in it"  . Tonsillectomy    . Fracture surgery    . Cataract extraction w/ intraocular lens  implant, bilateral Bilateral 2000's  . Mohs surgery  X 2    "nose; nose"  . Skin cancer excision      "all over my head; ears; back  . Esophagogastroduodenoscopy (egd) with esophageal dilation  X 1  . Back surgery      neck surgery  . Coronary artery bypass graft  1999  . Coronary artery bypass graft N/A 03/01/2014    Procedure: REDO CORONARY ARTERY BYPASS GRAFTING TIMES THREE, USING RIGHT GREATER SAPHENOUS VEIN VIA ENDOVEIN HARVEST.;  Surgeon: Gaye Pollack, MD;  Location: [place] OR;  Service: Open Heart Surgery;  Laterality: N/A;  . Left heart catheterization with coronary/graft angiogram  02/26/2014    Procedure: LEFT HEART CATHETERIZATION WITH Beatrix Fetters;  Surgeon: Sinclair Grooms, MD;  Location: [facility] CATH LAB;  Service:  Cardiovascular;;  . Left heart catheterization with coronary/graft angiogram N/A 07/23/2014    Procedure: LEFT HEART CATHETERIZATION WITH Beatrix Fetters;  Surgeon: Sinclair Grooms, MD;  Location: [facility] CATH LAB;  Service: Cardiovascular;  Laterality: N/A;  . Left heart catheterization with coronary/graft angiogram N/A 08/30/2014    Procedure: LEFT HEART CATHETERIZATION WITH Beatrix Fetters;  Surgeon: Leonie Man, MD;  Location: [facility] CATH LAB;  Service: Cardiovascular;  Laterality: N/A;     Family History  Problem Relation Age of Onset  . CAD Mother   . Cirrhosis Brother      History   Social History  . Marital Status: Married  Spouse Name: N/A    Number of Children: N/A  . Years of Education: N/A   Occupational History  . Not on file.   Social History Main Topics  . Smoking status: Former Smoker -- 1.00 packs/day for 50 years    Types: Cigarettes    Quit date: 02/25/2014  . Smokeless tobacco: Never Used  . Alcohol Use: No  . Drug Use: No  . Sexual Activity: Not on file   Other Topics Concern  . Not on file   Social History Narrative   Tour manager            ROS A 10 point review of system was performed. It is negative other than that mentioned in the history of present illness.   PHYSICAL EXAM   BP 115/50 mmHg  Pulse 62  Ht 5\' 6"  (1.676 m)  Wt 160 lb (72.576 kg)  BMI 25.84 kg/m2  SpO2 97% Constitutional: He is oriented to person, place, and time. He appears well-developed and well-nourished. No distress.  HENT: No nasal discharge.  Head: Normocephalic and atraumatic.  Eyes: Pupils are equal and round.  No discharge. Neck: Normal range of motion. Neck supple. No JVD present. No thyromegaly present.  Cardiovascular: Normal rate, regular rhythm, normal heart sounds. Exam reveals no gallop and no friction rub. No murmur heard.  Pulmonary/Chest: Effort normal and breath sounds normal. No stridor. No respiratory distress.  He has no wheezes. He has no rales. He exhibits no tenderness.  Abdominal: Soft. Bowel sounds are normal. He exhibits no distension. There is no tenderness. There is no rebound and no guarding.  Musculoskeletal: Normal range of motion. He exhibits no edema and no tenderness.  Neurological: He is alert and oriented to person, place, and time. Coordination normal.  Skin: Skin is warm and dry. No rash noted. He is not diaphoretic. No erythema. No pallor.  Psychiatric: He has a normal mood and affect. His behavior is normal. Judgment and thought content normal.  Vascular: Femoral pulse: +1 on the right side and absent on the left side, distal pulses are not palpable        ASSESSMENT AND PLAN

## 2014-11-27 NOTE — CV Procedure (Signed)
    PERIPHERAL VASCULAR PROCEDURE  NAME:  [name]   MRN: [id number] DOB:  06-10-1941   ADMIT DATE: 11/27/2014  Performing Cardiologist: Kathlyn Sacramento Primary Physician: Shirline Frees, MD Primary Cardiologist:  Dr. Daneen Schick  Procedures Performed:  Abdominal Aortic Angiogram with limited Bi-Iliofemoral Runoff    Indication(s):   Claudication    Consent: The procedure with Risks/Benefits/Alternatives and Indications was reviewed with the patient .  All questions were answered.  Medications:  Sedation:  1 mg IV Versed, 25 mcg IV Fentanyl  Contrast:  35 ml   Visipaque   Procedural details: The right groin was prepped, draped, and anesthetized with 1% lidocaine. Using modified Seldinger technique, a 4 French micropuncture sheath was introduced into the right common femoral artery. This was exchanged into a 5 [place] sheath. A 5 Fr Short Pigtail Catheter was advanced of over a  Versicore wire into the descending Aorta to a level just above the renal arteries. A manuel injection with CO2 was performed. The catheter was then pulled back above the iliac bifurcation. Angiography was performed with CO2. Visualization was very poor. Thus, A power injection of 84ml/sec contrast over 1 sec was performed for iliac angiography and angiography of the proximal SFA/profunda.  The patient tolerated the procedure well with no immediate complications.     Hemodynamics:  Central Aortic Pressure / Mean Aortic Pressure: 160/70  Findings:  Abdominal aorta: Diffuse atherosclerosis without obstructive disease or aneurysm. The aorta is a very small caliber.  Left renal artery: Not well visualized but appears to be normal.  Right renal artery: Normal but not well visualized  Celiac artery: Seems patent.  Superior mesenteric artery: Patent  Right common iliac artery: Very small caliber with diffuse 60% disease throughout its course  Right internal iliac artery: Diffuse 60% disease in  the mid and distal segment  Right external iliac artery: Diffuse 60-70% disease throughout.  Right common femoral artery: Diffuse 20% disease.  Right profunda femoral artery: Normal  Right superficial femoral artery: Occluded proximally   Left common iliac artery:  80% diffuse disease proximally followed by diffuse 40% disease.  Left internal iliac artery: 90% ostial stenosis  Left external iliac artery: Diffuse 40% disease  Left common femoral artery: 30% disease.  Left profunda femoral artery: Normal proximally  Left superficial femoral artery:  Occluded proximally.   Conclusions: 1. Diffuse bilateral iliac disease. The only potential area of treatment is the left common iliac artery. However, there is diffuse disease throughout the whole iliac system with very small diameter. Thus, there is no good treatment options for endovascular intervention. 2. Chronically occluded SFAs.  Recommendations:  The patient is too high risk for aortobifemoral bypass. Continue medical therapy for now. Reserve revascularization for critical limb ischemia.   Kathlyn Sacramento, MD, [facility] 11/27/2014 1:35 PM

## 2014-11-27 NOTE — Discharge Instructions (Signed)
Angiogram, Care After °Refer to this sheet in the next few weeks. These instructions provide you with information on caring for yourself after your procedure. Your health care provider may also give you more specific instructions. Your treatment has been planned according to current medical practices, but problems sometimes occur. Call your health care provider if you have any problems or questions after your procedure.  °WHAT TO EXPECT AFTER THE PROCEDURE °After your procedure, it is typical to have the following sensations: °· Minor discomfort or tenderness and a small bump at the catheter insertion site. The bump should usually decrease in size and tenderness within 1 to 2 weeks. °· Any bruising will usually fade within 2 to 4 weeks. °HOME CARE INSTRUCTIONS  °· You may need to keep taking blood thinners if they were prescribed for you. Take medicines only as directed by your health care provider. °· Do not apply powder or lotion to the site. °· Do not take baths, swim, or use a hot tub until your health care provider approves. °· You may shower 24 hours after the procedure. Remove the bandage (dressing) and gently wash the site with plain soap and water. Gently pat the site dry. °· Inspect the site at least twice daily. °· Limit your activity for the first 48 hours. Do not bend, squat, or lift anything over 20 lb (9 kg) or as directed by your health care provider. °· Plan to have someone take you home after the procedure. Follow instructions about when you can drive or return to work. °SEEK MEDICAL CARE IF: °· You get light-headed when standing up. °· You have drainage (other than a small amount of blood on the dressing). °· You have chills. °· You have a fever. °· You have redness, warmth, swelling, or pain at the insertion site. °SEEK IMMEDIATE MEDICAL CARE IF:  °· You develop chest pain or shortness of breath, feel faint, or pass out. °· You have bleeding, swelling larger than a walnut, or drainage from the  catheter insertion site. °· You develop pain, discoloration, coldness, or severe bruising in the leg or arm that held the catheter. °· You have heavy bleeding from the site. If this happens, hold pressure on the site and call 911. °MAKE SURE YOU: °· Understand these instructions. °· Will watch your condition. °· Will get help right away if you are not doing well or get worse. °Document Released: 04/22/2005 Document Revised: 02/18/2014 Document Reviewed: 02/26/2013 °ExitCare® Patient Information ©2015 [employer]. This information is not intended to replace advice given to you by your health care provider. Make sure you discuss any questions you have with your health care provider. ° °

## 2014-11-28 ENCOUNTER — Other Ambulatory Visit: Payer: Self-pay

## 2014-11-28 DIAGNOSIS — I739 Peripheral vascular disease, unspecified: Secondary | ICD-10-CM

## 2014-11-29 ENCOUNTER — Telehealth: Payer: Self-pay | Admitting: Physician Assistant

## 2014-11-29 ENCOUNTER — Encounter (HOSPITAL_COMMUNITY): Payer: Medicare Other

## 2014-11-29 ENCOUNTER — Telehealth (HOSPITAL_COMMUNITY): Payer: Self-pay | Admitting: Cardiac Rehabilitation

## 2014-11-29 NOTE — Telephone Encounter (Signed)
-----   Message from Wellington Hampshire, MD sent at 11/29/2014  1:43 PM EST ----- Regarding: RE: cardiac rehab He can resume cardiac rehab on Monday with no restrictions.  Thanks.  MFletcher Anon.   ----- Message -----    From: Lowell Guitar, RN    Sent: 11/29/2014  12:32 PM      To: Wellington Hampshire, MD Subject: cardiac rehab                                  Dear Dr. Fletcher Anon,  Pt had angiogram 11/27/14.  Is it ok for him to return to cardiac rehab? Are there any activity restrictions?  Thank you, Andi Hence, RN, BSN Cardiac Pulmonary Rehab

## 2014-11-29 NOTE — Telephone Encounter (Signed)
    [name] admitted for abdominal aortic angiogram on 11/27/14 by Dr. Fletcher Anon. His creat was noted to be elevated and his lasix was decreased from 40mg  BID to 40mg  qd. Now with 3 lbs weight gain and feeling SOB. He says he feels classic for his CHF. He has lab work previously scheduled for Monday and also a new consult with Nephrology on Monday. Will give him further directions with Lasix dosing after lab work on Monday. He knows to call us back if his symptoms do not improve.   Angelena Form PA-C  [facility]

## 2014-12-02 ENCOUNTER — Other Ambulatory Visit: Payer: Self-pay

## 2014-12-02 ENCOUNTER — Encounter (HOSPITAL_COMMUNITY): Payer: Medicare Other

## 2014-12-02 ENCOUNTER — Telehealth (HOSPITAL_COMMUNITY): Payer: Self-pay | Admitting: Cardiac Rehabilitation

## 2014-12-02 NOTE — Telephone Encounter (Signed)
pc to pt to discuss returning to cardiac rehab.  Pt states his URI is resolved and he feels well.  Pt informed he has been cleared by Dr. Fletcher Anon to return post angiogram.  Pt plans to return Wednesday Feb 17,2016.

## 2014-12-03 ENCOUNTER — Other Ambulatory Visit (INDEPENDENT_AMBULATORY_CARE_PROVIDER_SITE_OTHER): Payer: Medicare Other | Admitting: *Deleted

## 2014-12-03 DIAGNOSIS — I739 Peripheral vascular disease, unspecified: Secondary | ICD-10-CM

## 2014-12-03 LAB — BASIC METABOLIC PANEL
BUN: 54 mg/dL — ABNORMAL HIGH (ref 6–23)
CALCIUM: 9 mg/dL (ref 8.4–10.5)
CHLORIDE: 103 meq/L (ref 96–112)
CO2: 29 meq/L (ref 19–32)
CREATININE: 2.07 mg/dL — AB (ref 0.40–1.50)
GFR: 33.6 mL/min — AB (ref >60.00)
Glucose, Bld: 269 mg/dL — ABNORMAL HIGH (ref 70–99)
POTASSIUM: 4.8 meq/L (ref 3.5–5.1)
SODIUM: 137 meq/L (ref 135–145)

## 2014-12-04 ENCOUNTER — Encounter (HOSPITAL_COMMUNITY)
Admission: RE | Admit: 2014-12-04 | Discharge: 2014-12-04 | Disposition: A | Discharge status: Home / Self Care | Payer: Medicare Other | Source: Ambulatory Visit | Attending: Interventional Cardiology | Admitting: Interventional Cardiology

## 2014-12-04 DIAGNOSIS — Z79899 Other long term (current) drug therapy: Secondary | ICD-10-CM | POA: Diagnosis not present

## 2014-12-04 DIAGNOSIS — Z951 Presence of aortocoronary bypass graft: Secondary | ICD-10-CM | POA: Diagnosis not present

## 2014-12-04 DIAGNOSIS — Z48812 Encounter for surgical aftercare following surgery on the circulatory system: Secondary | ICD-10-CM | POA: Diagnosis not present

## 2014-12-04 DIAGNOSIS — Z7982 Long term (current) use of aspirin: Secondary | ICD-10-CM | POA: Diagnosis not present

## 2014-12-04 DIAGNOSIS — I252 Old myocardial infarction: Secondary | ICD-10-CM | POA: Diagnosis not present

## 2014-12-04 NOTE — Progress Notes (Signed)
Pt returned to cardiac rehab today following his angiogram.  Pt tolerated light activity without difficulty.  Pt reports he started plavix 75mg  once daily, Decreased ASA 81mg  daily and metoprolol 50mg  daily.  Medication list reconciled.

## 2014-12-06 ENCOUNTER — Encounter (HOSPITAL_COMMUNITY)
Admission: RE | Admit: 2014-12-06 | Discharge: 2014-12-06 | Disposition: A | Discharge status: Home / Self Care | Payer: Medicare Other | Source: Ambulatory Visit | Attending: Interventional Cardiology | Admitting: Interventional Cardiology

## 2014-12-06 DIAGNOSIS — I252 Old myocardial infarction: Secondary | ICD-10-CM | POA: Diagnosis not present

## 2014-12-06 DIAGNOSIS — Z79899 Other long term (current) drug therapy: Secondary | ICD-10-CM | POA: Diagnosis not present

## 2014-12-06 DIAGNOSIS — Z951 Presence of aortocoronary bypass graft: Secondary | ICD-10-CM | POA: Diagnosis not present

## 2014-12-06 DIAGNOSIS — Z48812 Encounter for surgical aftercare following surgery on the circulatory system: Secondary | ICD-10-CM | POA: Diagnosis not present

## 2014-12-06 DIAGNOSIS — Z7982 Long term (current) use of aspirin: Secondary | ICD-10-CM | POA: Diagnosis not present

## 2014-12-09 ENCOUNTER — Ambulatory Visit: Payer: Self-pay | Admitting: Interventional Cardiology

## 2014-12-09 ENCOUNTER — Encounter (HOSPITAL_COMMUNITY)
Admission: RE | Admit: 2014-12-09 | Discharge: 2014-12-09 | Disposition: A | Discharge status: Home / Self Care | Payer: Medicare Other | Source: Ambulatory Visit | Attending: Interventional Cardiology | Admitting: Interventional Cardiology

## 2014-12-09 DIAGNOSIS — I252 Old myocardial infarction: Secondary | ICD-10-CM | POA: Diagnosis not present

## 2014-12-09 DIAGNOSIS — Z79899 Other long term (current) drug therapy: Secondary | ICD-10-CM | POA: Diagnosis not present

## 2014-12-09 DIAGNOSIS — Z48812 Encounter for surgical aftercare following surgery on the circulatory system: Secondary | ICD-10-CM | POA: Diagnosis not present

## 2014-12-09 DIAGNOSIS — Z951 Presence of aortocoronary bypass graft: Secondary | ICD-10-CM | POA: Diagnosis not present

## 2014-12-09 DIAGNOSIS — Z7982 Long term (current) use of aspirin: Secondary | ICD-10-CM | POA: Diagnosis not present

## 2014-12-11 ENCOUNTER — Encounter (HOSPITAL_COMMUNITY)
Admission: RE | Admit: 2014-12-11 | Discharge: 2014-12-11 | Disposition: A | Discharge status: Home / Self Care | Payer: Medicare Other | Source: Ambulatory Visit | Attending: Interventional Cardiology | Admitting: Interventional Cardiology

## 2014-12-11 DIAGNOSIS — Z79899 Other long term (current) drug therapy: Secondary | ICD-10-CM | POA: Diagnosis not present

## 2014-12-11 DIAGNOSIS — Z48812 Encounter for surgical aftercare following surgery on the circulatory system: Secondary | ICD-10-CM | POA: Diagnosis not present

## 2014-12-11 DIAGNOSIS — Z7982 Long term (current) use of aspirin: Secondary | ICD-10-CM | POA: Diagnosis not present

## 2014-12-11 DIAGNOSIS — Z951 Presence of aortocoronary bypass graft: Secondary | ICD-10-CM | POA: Diagnosis not present

## 2014-12-11 DIAGNOSIS — I252 Old myocardial infarction: Secondary | ICD-10-CM | POA: Diagnosis not present

## 2014-12-11 NOTE — Progress Notes (Signed)
Reviewed home exercise guidelines with patient including endpoints, temperature precautions, target heart rate and rate of perceived exertion. Pt is currently walking 10 minutes 3 days/week as his mode of home exercise. Encouraged patient to increase this to 10 minutes 2 times/day with the goal of increasing to 10 minutes 3 times/day. Pt also has a treadmill at home that he uses limited amounts due to peripheral artery disease. Pt voices understanding of instructions given. Sol Passer, MS, ACSM CCEP

## 2014-12-13 ENCOUNTER — Encounter (HOSPITAL_COMMUNITY)
Admission: RE | Admit: 2014-12-13 | Discharge: 2014-12-13 | Disposition: A | Discharge status: Home / Self Care | Payer: Medicare Other | Source: Ambulatory Visit | Attending: Interventional Cardiology | Admitting: Interventional Cardiology

## 2014-12-13 ENCOUNTER — Encounter: Payer: Self-pay | Admitting: *Deleted

## 2014-12-13 DIAGNOSIS — Z7982 Long term (current) use of aspirin: Secondary | ICD-10-CM | POA: Diagnosis not present

## 2014-12-13 DIAGNOSIS — Z48812 Encounter for surgical aftercare following surgery on the circulatory system: Secondary | ICD-10-CM | POA: Diagnosis not present

## 2014-12-13 DIAGNOSIS — Z79899 Other long term (current) drug therapy: Secondary | ICD-10-CM | POA: Diagnosis not present

## 2014-12-13 DIAGNOSIS — I252 Old myocardial infarction: Secondary | ICD-10-CM | POA: Diagnosis not present

## 2014-12-13 DIAGNOSIS — Z951 Presence of aortocoronary bypass graft: Secondary | ICD-10-CM | POA: Diagnosis not present

## 2014-12-16 ENCOUNTER — Encounter (HOSPITAL_COMMUNITY)
Admission: RE | Admit: 2014-12-16 | Discharge: 2014-12-16 | Disposition: A | Discharge status: Home / Self Care | Payer: Medicare Other | Source: Ambulatory Visit | Attending: Interventional Cardiology | Admitting: Interventional Cardiology

## 2014-12-16 DIAGNOSIS — Z48812 Encounter for surgical aftercare following surgery on the circulatory system: Secondary | ICD-10-CM | POA: Diagnosis not present

## 2014-12-16 DIAGNOSIS — Z951 Presence of aortocoronary bypass graft: Secondary | ICD-10-CM | POA: Diagnosis not present

## 2014-12-16 DIAGNOSIS — N183 Chronic kidney disease, stage 3 (moderate): Secondary | ICD-10-CM | POA: Diagnosis not present

## 2014-12-16 DIAGNOSIS — I252 Old myocardial infarction: Secondary | ICD-10-CM | POA: Diagnosis not present

## 2014-12-16 DIAGNOSIS — N2581 Secondary hyperparathyroidism of renal origin: Secondary | ICD-10-CM | POA: Diagnosis not present

## 2014-12-16 DIAGNOSIS — I10 Essential (primary) hypertension: Secondary | ICD-10-CM | POA: Diagnosis not present

## 2014-12-16 DIAGNOSIS — Z79899 Other long term (current) drug therapy: Secondary | ICD-10-CM | POA: Diagnosis not present

## 2014-12-16 DIAGNOSIS — Z7982 Long term (current) use of aspirin: Secondary | ICD-10-CM | POA: Diagnosis not present

## 2014-12-16 DIAGNOSIS — N189 Chronic kidney disease, unspecified: Secondary | ICD-10-CM | POA: Diagnosis not present

## 2014-12-16 DIAGNOSIS — D631 Anemia in chronic kidney disease: Secondary | ICD-10-CM | POA: Diagnosis not present

## 2014-12-17 ENCOUNTER — Ambulatory Visit (INDEPENDENT_AMBULATORY_CARE_PROVIDER_SITE_OTHER): Payer: Medicare Other | Admitting: Cardiovascular Disease

## 2014-12-17 ENCOUNTER — Encounter: Payer: Self-pay | Admitting: Cardiovascular Disease

## 2014-12-17 ENCOUNTER — Other Ambulatory Visit: Payer: Self-pay | Admitting: Nephrology

## 2014-12-17 VITALS — BP 118/60 | HR 62 | Ht 65.5 in | Wt 161.0 lb

## 2014-12-17 DIAGNOSIS — N183 Chronic kidney disease, stage 3 unspecified: Secondary | ICD-10-CM

## 2014-12-17 DIAGNOSIS — I2510 Atherosclerotic heart disease of native coronary artery without angina pectoris: Secondary | ICD-10-CM | POA: Diagnosis not present

## 2014-12-17 DIAGNOSIS — I739 Peripheral vascular disease, unspecified: Secondary | ICD-10-CM | POA: Diagnosis not present

## 2014-12-17 NOTE — Assessment & Plan Note (Signed)
The patient has diffuse iliac disease bilaterally. However, the left common iliac artery has a tightest stenosis. He does complain of left hip and thigh pain which starts first. He then starts having bilateral calf claudication which is expected based on bilateral SFA occlusion. Thus, endovascular intervention on the left common iliac artery might partially improve his symptoms. He is currently attending cardiac rehabilitation and reports some improvement in symptoms. I'm going to evaluate him again in 3 months and consider proceeding with left common iliac artery stent placement if indicated at that time. We have to keep in mind his chronic kidney disease and risk of contrast-induced nephropathy.

## 2014-12-17 NOTE — Progress Notes (Signed)
Primary cardiologist: Dr. Tamala Julian  HPI  This is a pleasant 74 year old man who is here today for a follow up visit regarding  peripheral arterial disease. He has extensive medical problems that include CAD (CABG in 1999 with multiple PCIs since, repeat CABG 02/2014, HLD, OSA, CHF (EF 45-50% in 08/2014 with grade II DD, >55% by echo 09/2014 at [facility]), T2DM, COPD, OSA (doesn't wear mask), stage III-IV CKD, polymyalgia rheumatica, PVD including carotid stenosis (moderate by duplex 12/2013, remote LE stenting) and anemia.  2D echo 08/26/14: EF 45-50%, akinesis inferiorly, grade 2 d/d, mild MR, no sig change from prior. LDL 64.  He has prolonged history of peripheral arterial disease with previous iliac stenting in 2001 done by Dr. Amedeo Plenty. He is known to have bilateral SFA occlusions. He reports bilateral calf claudication which is equal in both sides after walking about 50 yards. He has to rest for about 5-10 minutes before he can resume . His symptoms have been stable for a few years. He quit smoking in May 2015. No rest pain or lower extremity ulceration. He does have chronic kidney disease with Creatinine around 2.  Noninvasive evaluation showed an ABI in 0.4 range bilaterally with chronic SFA occlusion and severe bilateral external iliac artery stenosis.  I proceeded with CO2 angiography last month which showed: 1. Diffuse bilateral iliac disease. The only potential area of treatment is the left common iliac artery. However, there is diffuse disease throughout the whole iliac system with very small diameter. Thus, there is no good treatment options for endovascular intervention. 2. Chronically occluded SFAs.   No Known Allergies   Current Outpatient Prescriptions on File Prior to Visit  Medication Sig Dispense Refill  . acetaminophen (TYLENOL) 325 MG tablet Take 650 mg by mouth every 6 (six) hours as needed for moderate pain.    Marland Kitchen amLODipine (NORVASC) 10 MG tablet Take 10 mg by mouth daily.    Marland Kitchen  aspirin EC 81 MG EC tablet Take 1 tablet (81 mg total) by mouth daily. 1 tablet 0  . atorvastatin (LIPITOR) 20 MG tablet Take 20 mg by mouth daily at 6 PM.     . cholecalciferol (VITAMIN D) 1000 UNITS tablet Take 1,000 Units by mouth daily.    . clopidogrel (PLAVIX) 75 MG tablet Take 1 tablet (75 mg total) by mouth daily. 90 tablet 3  . glipiZIDE (GLUCOTROL) 10 MG tablet Take 1 tablet (10 mg total) by mouth 2 (two) times daily before a meal. 60 tablet 0  . Insulin Glargine (LANTUS) 100 UNIT/ML Solostar Pen Inject 10 Units into the skin every morning. 15 mL 1  . Insulin Pen Needle (FIFTY50 PEN NEEDLES) 31G X 5 MM MISC As directed 30 each 1  . irbesartan (AVAPRO) 300 MG tablet Take 150 mg by mouth daily. Taking one half tablet by mouth daily    . isosorbide mononitrate (IMDUR) 120 MG 24 hr tablet Take 1 tablet (120 mg total) by mouth daily. 30 tablet 5  . levofloxacin (LEVAQUIN) 500 MG tablet Take 1 tablet by mouth daily.  0  . metoprolol (LOPRESSOR) 50 MG tablet Take 1 tablet (50 mg total) by mouth 2 (two) times daily. 60 tablet 11  . Multiple Vitamins-Minerals (MULTIVITAMIN WITH MINERALS) tablet Take 1 tablet by mouth daily.    . nitroGLYCERIN (NITROSTAT) 0.4 MG SL tablet Place 0.4 mg under the tongue every 5 (five) minutes as needed for chest pain.    . pantoprazole (PROTONIX) 40 MG tablet Take 1 tablet (40 mg  total) by mouth daily. 90 tablet 3  . torsemide (DEMADEX) 20 MG tablet Take 2 tablets by mouth once a day     No current facility-administered medications on file prior to visit.     Past Medical History  Diagnosis Date  . Carotid artery occlusion     a. Duplex 12/2013: mild plaque bilat, 40-59% BICA. F/u due 12/2014.  Marland Kitchen Hyperlipidemia   . Atherosclerosis of native arteries of the extremities with intermittent claudication     a. L external iliac stent 2001, bilateral SFA occlusions documented 2001.  Marland Kitchen Hypertension   . GERD (gastroesophageal reflux disease)   . Esophageal stricture      a. History of dilitation - Long-standing history of esophageal reflux   . 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 1999: LIMA to LAD, sequential SVG to diagonal 2 and diagonal 3, sequential SVG to PDA, acute marginal branch, and PL branch. b. Repeat CABG 2015 with SVG to D1, SVG to PDA, and SVG to OM.   Marland Kitchen CAD (coronary artery disease), native coronary artery 09/24/2013    a. CABG x 6 1999. b. Interim PCIs: Angioplasty SVG-PDA 2006.Taxus DES to native LCx in 2007.DES to SVG-PDA 2007, restented 2009.NSTEMI with PCI to SVG- PDA complicated by no-reflow/inferior infarction in 2012. c. Redo CABG 2015 with SVG-PD, SVG-D1, SVG-OM1.  . Chronic combined systolic and diastolic CHF (congestive heart failure)     a. Prior EF 45-50% in 08/2014. b. Improved EF >55% in 09/2014 at [facility].  . MI (myocardial infarction) 1999-02/2014  . OSA (obstructive sleep apnea)     "don't wear mask" (08/26/2014)  . Type II diabetes mellitus   . Anemia     a. + FOBT 08/2014.  Marland Kitchen History of hiatal hernia   . Daily headache   . Arthritis   . Chronic lower back pain   . CKD (chronic kidney disease) stage 3, GFR 30-59 ml/min   . Skin cancer   . Hypotonic bladder     Requiring in and out catheterization performed by the patient at home - occasional blood tinged urine (self-cath's daily for the last 2 years) and hematuria is chronic for him, followed by urology.  . Polymyalgia rheumatica   . SVT (supraventricular tachycardia)   . COPD (chronic obstructive pulmonary disease)      Past Surgical History  Procedure Laterality Date  . Coronary stent placement      "I've had 8 put in; I presently have none" (08/26/2014)  . Cardiac catheterization    . Intraoperative transesophageal echocardiogram N/A 03/01/2014    Procedure: INTRAOPERATIVE TRANSESOPHAGEAL ECHOCARDIOGRAM;  Surgeon: Gaye Pollack, MD;  Location: [facility] OR;  Service: Open Heart Surgery;  Laterality: N/A;  . Anterior cervical decomp/discectomy fusion    . Ankle fracture surgery  Right ~ 1977    "10 plates and 7 screws placed in it"  . Tonsillectomy    . Fracture surgery    . Cataract extraction w/ intraocular lens  implant, bilateral Bilateral 2000's  . Mohs surgery  X 2    "nose; nose"  . Skin cancer excision      "all over my head; ears; back  . Esophagogastroduodenoscopy (egd) with esophageal dilation  X 1  . Back surgery      neck surgery  . Coronary artery bypass graft  1999  . Coronary artery bypass graft N/A 03/01/2014    Procedure: REDO CORONARY ARTERY BYPASS GRAFTING TIMES THREE, USING RIGHT GREATER SAPHENOUS VEIN VIA ENDOVEIN HARVEST.;  Surgeon: Gaye Pollack, MD;  Location: [place];  Service: Open Heart Surgery;  Laterality: N/A;  . Left heart catheterization with coronary/graft angiogram  02/26/2014    Procedure: LEFT HEART CATHETERIZATION WITH Beatrix Fetters;  Surgeon: Sinclair Grooms, MD;  Location: [facility] CATH LAB;  Service: Cardiovascular;;  . Left heart catheterization with coronary/graft angiogram N/A 07/23/2014    Procedure: LEFT HEART CATHETERIZATION WITH Beatrix Fetters;  Surgeon: Sinclair Grooms, MD;  Location: [facility] CATH LAB;  Service: Cardiovascular;  Laterality: N/A;  . Left heart catheterization with coronary/graft angiogram N/A 08/30/2014    Procedure: LEFT HEART CATHETERIZATION WITH Beatrix Fetters;  Surgeon: Leonie Man, MD;  Location: [facility] CATH LAB;  Service: Cardiovascular;  Laterality: N/A;  . Abdominal aortagram N/A 11/27/2014    Procedure: ABDOMINAL Maxcine Ham;  Surgeon: Wellington Hampshire, MD;  Location: [place] CATH LAB;  Service: Cardiovascular;  Laterality: N/A;     Family History  Problem Relation Age of Onset  . CAD Mother   . Cirrhosis Brother      History   Social History  . Marital Status: Married    Spouse Name: N/A  . Number of Children: N/A  . Years of Education: N/A   Occupational History  . Not on file.   Social History Main Topics  . Smoking status: Former Smoker -- 1.00 packs/day for  50 years    Types: Cigarettes    Quit date: 02/25/2014  . Smokeless tobacco: Never Used  . Alcohol Use: No  . Drug Use: No  . Sexual Activity: Not on file   Other Topics Concern  . Not on file   Social History Narrative   Tour manager            ROS A 10 point review of system was performed. It is negative other than that mentioned in the history of present illness.   PHYSICAL EXAM   BP 118/60 mmHg  Pulse 62  Ht 5' 5.5" (1.664 m)  Wt 161 lb (73.029 kg)  BMI 26.37 kg/m2 Constitutional: He is oriented to person, place, and time. He appears well-developed and well-nourished. No distress.  HENT: No nasal discharge.  Head: Normocephalic and atraumatic.  Eyes: Pupils are equal and round.  No discharge. Neck: Normal range of motion. Neck supple. No JVD present. No thyromegaly present.  Cardiovascular: Normal rate, regular rhythm, normal heart sounds. Exam reveals no gallop and no friction rub. No murmur heard.  Pulmonary/Chest: Effort normal and breath sounds normal. No stridor. No respiratory distress. He has no wheezes. He has no rales. He exhibits no tenderness.  Abdominal: Soft. Bowel sounds are normal. He exhibits no distension. There is no tenderness. There is no rebound and no guarding.  Musculoskeletal: Normal range of motion. He exhibits no edema and no tenderness.  Neurological: He is alert and oriented to person, place, and time. Coordination normal.  Skin: Skin is warm and dry. No rash noted. He is not diaphoretic. No erythema. No pallor.  Psychiatric: He has a normal mood and affect. His behavior is normal. Judgment and thought content normal.  Vascular: Femoral pulse: +1 on the right side and absent on the left side, distal pulses are not palpable No groin hematoma.        ASSESSMENT AND PLAN

## 2014-12-17 NOTE — Patient Instructions (Signed)
Your physician recommends that you schedule a follow-up appointment in: 3 MONTHS with Dr Arida  Your physician recommends that you continue on your current medications as directed. Please refer to the Current Medication list given to you today.  

## 2014-12-18 ENCOUNTER — Encounter (HOSPITAL_COMMUNITY)
Admission: RE | Admit: 2014-12-18 | Discharge: 2014-12-18 | Disposition: A | Discharge status: Home / Self Care | Payer: Medicare Other | Source: Ambulatory Visit | Attending: Interventional Cardiology | Admitting: Interventional Cardiology

## 2014-12-18 ENCOUNTER — Other Ambulatory Visit: Payer: Self-pay | Admitting: Interventional Cardiology

## 2014-12-18 DIAGNOSIS — Z5189 Encounter for other specified aftercare: Secondary | ICD-10-CM | POA: Diagnosis not present

## 2014-12-18 DIAGNOSIS — I252 Old myocardial infarction: Secondary | ICD-10-CM | POA: Insufficient documentation

## 2014-12-18 DIAGNOSIS — Z951 Presence of aortocoronary bypass graft: Secondary | ICD-10-CM | POA: Insufficient documentation

## 2014-12-20 ENCOUNTER — Encounter (HOSPITAL_COMMUNITY)
Admission: RE | Admit: 2014-12-20 | Discharge: 2014-12-20 | Disposition: A | Discharge status: Home / Self Care | Payer: Medicare Other | Source: Ambulatory Visit | Attending: Interventional Cardiology | Admitting: Interventional Cardiology

## 2014-12-20 DIAGNOSIS — Z951 Presence of aortocoronary bypass graft: Secondary | ICD-10-CM | POA: Diagnosis not present

## 2014-12-20 DIAGNOSIS — Z5189 Encounter for other specified aftercare: Secondary | ICD-10-CM | POA: Diagnosis not present

## 2014-12-20 DIAGNOSIS — I252 Old myocardial infarction: Secondary | ICD-10-CM | POA: Diagnosis not present

## 2014-12-23 ENCOUNTER — Encounter (HOSPITAL_COMMUNITY)
Admission: RE | Admit: 2014-12-23 | Discharge: 2014-12-23 | Disposition: A | Discharge status: Home / Self Care | Payer: Medicare Other | Source: Ambulatory Visit | Attending: Interventional Cardiology | Admitting: Interventional Cardiology

## 2014-12-23 DIAGNOSIS — I252 Old myocardial infarction: Secondary | ICD-10-CM | POA: Diagnosis not present

## 2014-12-23 DIAGNOSIS — Z5189 Encounter for other specified aftercare: Secondary | ICD-10-CM | POA: Diagnosis not present

## 2014-12-23 DIAGNOSIS — Z951 Presence of aortocoronary bypass graft: Secondary | ICD-10-CM | POA: Diagnosis not present

## 2014-12-24 ENCOUNTER — Telehealth: Payer: Self-pay

## 2014-12-24 ENCOUNTER — Ambulatory Visit: Payer: Self-pay | Admitting: Interventional Cardiology

## 2014-12-24 ENCOUNTER — Ambulatory Visit (HOSPITAL_COMMUNITY): Payer: Medicare Other | Attending: Cardiology | Admitting: *Deleted

## 2014-12-24 DIAGNOSIS — I6529 Occlusion and stenosis of unspecified carotid artery: Secondary | ICD-10-CM | POA: Diagnosis not present

## 2014-12-24 DIAGNOSIS — I6523 Occlusion and stenosis of bilateral carotid arteries: Secondary | ICD-10-CM

## 2014-12-24 NOTE — Progress Notes (Signed)
Carotid Duplex Scan Performed 

## 2014-12-24 NOTE — Telephone Encounter (Signed)
Pt aware that Dr.Smith has reviewed his Carotid Dopp and their is progression of stenosis on the left side.pt needs a [facility] consult ASAP. Adv him a scheduler from our office will call him to schedule

## 2014-12-25 ENCOUNTER — Encounter (HOSPITAL_COMMUNITY)
Admission: RE | Admit: 2014-12-25 | Discharge: 2014-12-25 | Disposition: A | Discharge status: Home / Self Care | Payer: Medicare Other | Source: Ambulatory Visit | Attending: Interventional Cardiology | Admitting: Interventional Cardiology

## 2014-12-25 ENCOUNTER — Ambulatory Visit
Admission: RE | Admit: 2014-12-25 | Discharge: 2014-12-25 | Disposition: A | Discharge status: Home / Self Care | Payer: Medicare Other | Source: Ambulatory Visit | Attending: Nephrology | Admitting: Nephrology

## 2014-12-25 DIAGNOSIS — N3289 Other specified disorders of bladder: Secondary | ICD-10-CM | POA: Diagnosis not present

## 2014-12-25 DIAGNOSIS — N183 Chronic kidney disease, stage 3 unspecified: Secondary | ICD-10-CM

## 2014-12-25 DIAGNOSIS — I252 Old myocardial infarction: Secondary | ICD-10-CM | POA: Diagnosis not present

## 2014-12-25 DIAGNOSIS — Z5189 Encounter for other specified aftercare: Secondary | ICD-10-CM | POA: Diagnosis not present

## 2014-12-25 DIAGNOSIS — Z951 Presence of aortocoronary bypass graft: Secondary | ICD-10-CM | POA: Diagnosis not present

## 2014-12-25 NOTE — Progress Notes (Signed)
[name] 74 y.o. male Nutrition Note Spoke with pt. Pt well-known to this Probation officer from previous admissions. Nutrition Plan and Nutrition Survey goals reviewed with pt. Pt is following Step 2 of the Therapeutic Lifestyle Changes diet. Pt reports his diet has improved "even more since I have to watch the sodium so closely with CHF." Pt states his wife is supportive of diet and lifestyle changes.   Pt is diabetic. Last A1c indicates blood glucose fairly well-controlled. Per pt, his most recent A1c was "around 6.8." Pt checks CBG's TID. Pt checks am CBG after breakfast and CBG's range from 140-150 mg/dL per pt.  This Probation officer went over Diabetes Education test results. Pt expressed understanding of the information reviewed. Pt aware of nutrition education classes offered . Lab Results  Component Value Date   HGBA1C 7.3* 08/30/2014   Nutrition Diagnosis ? Food-and nutrition-related knowledge deficit related to lack of exposure to information as related to diagnosis of: ? CVD ? DM  ? Overweight related to excessive energy intake as evidenced by a BMI of 26.5  Nutrition RX/ Estimated Daily Nutrition Needs for: wt loss 1400-1600 Kcal, 35-45 gm fat, 9-11 gm sat fat, 1.3-1.6 gm trans-fat, <1500 mg sodium, 175 gm CHO   Nutrition Intervention ? Pt's individual nutrition plan reviewed with pt. ? Benefits of adopting Therapeutic Lifestyle Changes discussed when Medficts reviewed. ? Pt to attend the Portion Distortion class ? Pt to attend the  ? Nutrition I class - met 11/05/14                    ? Nutrition II class        ? Diabetes Blitz class       ? Diabetes Q & A class ? Pt given handouts for: ? Nutrition I class ? Nutrition II class ? Consistent vit K diet  ? low sodium ? DM ? pre-diabetes ? Diabetes Blitz class ? Diabetes Q & A class ? Continue client-centered nutrition education by RD, as part of interdisciplinary care. Goal(s) ? Pt to identify food quantities necessary to achieve: ? wt loss to  a goal wt of 151-155 lb (68.8-70.6 kg) at graduation from cardiac rehab.  ? Use pre-meal and post-meal CBG's and A1c to determine whether adjustments in food/meal planning will be beneficial or if any meds need to be combined with nutrition therapy. Monitor and Evaluate progress toward nutrition goal with team. Nutrition Risk: Change to Moderate Derek Mound, M.Ed, RD, LDN, CDE 12/25/2014 9:00 AM

## 2014-12-27 ENCOUNTER — Other Ambulatory Visit (HOSPITAL_COMMUNITY): Payer: Self-pay | Admitting: *Deleted

## 2014-12-27 ENCOUNTER — Encounter (HOSPITAL_COMMUNITY)
Admission: RE | Admit: 2014-12-27 | Discharge: 2014-12-27 | Disposition: A | Discharge status: Home / Self Care | Payer: Medicare Other | Source: Ambulatory Visit | Attending: Interventional Cardiology | Admitting: Interventional Cardiology

## 2014-12-27 ENCOUNTER — Other Ambulatory Visit: Payer: Self-pay

## 2014-12-27 ENCOUNTER — Other Ambulatory Visit: Payer: Self-pay | Admitting: *Deleted

## 2014-12-27 DIAGNOSIS — Z5189 Encounter for other specified aftercare: Secondary | ICD-10-CM | POA: Diagnosis not present

## 2014-12-27 DIAGNOSIS — I252 Old myocardial infarction: Secondary | ICD-10-CM | POA: Diagnosis not present

## 2014-12-27 DIAGNOSIS — I6523 Occlusion and stenosis of bilateral carotid arteries: Secondary | ICD-10-CM

## 2014-12-27 DIAGNOSIS — Z951 Presence of aortocoronary bypass graft: Secondary | ICD-10-CM | POA: Diagnosis not present

## 2014-12-27 NOTE — Progress Notes (Signed)
Pt c/o chest pain and fatigue while exercising on nustep at cardiac rehab.  Rates pain 6-7/10 associated with dyspnea and weakness.  Pt admits to working harder than usual.  BP:  130/72, O2 sat-99%.  twave inversions noted on lead II telemetry. 12 lead EKG obtained. Unchanged from previous EKG 10/14/2014.  Pain quickly resolved with rest.  Pt also reports having some episodes of chest soreness at home this morning at rest.  Rosaria Ferries, PA made aware.  No new orders received.  Per Suanne Marker, pt with known residual disease, medical treatment  instructed to decrease workloads to avoid onset of chest pain.  Recheck BP:  120/58.  Pt pain free at rest.  Pt able to walk track lightly without return of chest pain.  Pt instructed to present to ED for severe unrelieved chest pains.  Understanding verbalized

## 2014-12-30 ENCOUNTER — Encounter (HOSPITAL_COMMUNITY)
Admission: RE | Admit: 2014-12-30 | Discharge: 2014-12-30 | Disposition: A | Discharge status: Home / Self Care | Payer: Medicare Other | Source: Ambulatory Visit | Attending: Interventional Cardiology | Admitting: Interventional Cardiology

## 2014-12-30 ENCOUNTER — Encounter (HOSPITAL_COMMUNITY)
Admission: RE | Admit: 2014-12-30 | Discharge: 2014-12-30 | Disposition: A | Discharge status: Home / Self Care | Payer: Medicare Other | Source: Ambulatory Visit | Attending: Nephrology | Admitting: Nephrology

## 2014-12-30 DIAGNOSIS — D631 Anemia in chronic kidney disease: Secondary | ICD-10-CM | POA: Diagnosis not present

## 2014-12-30 DIAGNOSIS — I252 Old myocardial infarction: Secondary | ICD-10-CM | POA: Diagnosis not present

## 2014-12-30 DIAGNOSIS — Z951 Presence of aortocoronary bypass graft: Secondary | ICD-10-CM | POA: Diagnosis not present

## 2014-12-30 DIAGNOSIS — N183 Chronic kidney disease, stage 3 (moderate): Secondary | ICD-10-CM | POA: Diagnosis not present

## 2014-12-30 DIAGNOSIS — I214 Non-ST elevation (NSTEMI) myocardial infarction: Secondary | ICD-10-CM | POA: Diagnosis present

## 2014-12-30 DIAGNOSIS — Z5189 Encounter for other specified aftercare: Secondary | ICD-10-CM | POA: Diagnosis not present

## 2014-12-30 MED ORDER — SODIUM CHLORIDE 0.9 % IV SOLN
510.0000 mg | INTRAVENOUS | Status: DC
  Administered 2014-12-30: 510 mg via INTRAVENOUS
  Filled 2014-12-30: qty 17

## 2014-12-30 NOTE — Telephone Encounter (Signed)
called to ck on pt lmtcb.

## 2014-12-30 NOTE — Telephone Encounter (Signed)
returned pt call. pt sts thst he doing well. pt had some chest discomfort at cardiac rehab while exercising.pt sts that he "over did it" he has had no roccurrence of chest pain and is asymptomatic. Dr.Smith updated. No further assistance needed.

## 2014-12-30 NOTE — Telephone Encounter (Signed)
Follow up ° ° ° ° ° °Returning Lisa's call °

## 2014-12-30 NOTE — Discharge Instructions (Signed)

## 2014-12-31 ENCOUNTER — Encounter: Payer: Self-pay | Admitting: Vascular Surgery

## 2015-01-01 ENCOUNTER — Encounter (HOSPITAL_COMMUNITY): Admission: RE | Admit: 2015-01-01 | Payer: Medicare Other | Source: Ambulatory Visit

## 2015-01-01 ENCOUNTER — Telehealth (HOSPITAL_COMMUNITY): Payer: Self-pay | Admitting: *Deleted

## 2015-01-01 ENCOUNTER — Ambulatory Visit (HOSPITAL_COMMUNITY)
Admission: RE | Admit: 2015-01-01 | Discharge: 2015-01-01 | Disposition: A | Discharge status: Home / Self Care | Payer: Medicare Other | Source: Ambulatory Visit | Attending: Vascular Surgery | Admitting: Vascular Surgery

## 2015-01-01 ENCOUNTER — Telehealth: Payer: Self-pay | Admitting: Cardiovascular Disease

## 2015-01-01 ENCOUNTER — Encounter: Payer: Self-pay | Admitting: Vascular Surgery

## 2015-01-01 ENCOUNTER — Ambulatory Visit (INDEPENDENT_AMBULATORY_CARE_PROVIDER_SITE_OTHER): Payer: Medicare Other | Admitting: Vascular Surgery

## 2015-01-01 VITALS — BP 138/37 | HR 48 | Ht 66.0 in | Wt 163.0 lb

## 2015-01-01 DIAGNOSIS — I6523 Occlusion and stenosis of bilateral carotid arteries: Secondary | ICD-10-CM | POA: Insufficient documentation

## 2015-01-01 DIAGNOSIS — I6522 Occlusion and stenosis of left carotid artery: Secondary | ICD-10-CM | POA: Diagnosis not present

## 2015-01-01 NOTE — Progress Notes (Signed)
[facility]  Patient name: [name] MRN: [id number] DOB: [birth year] Sex: male  REASON FOR CONSULT: greater than 80% left carotid stenosis. Referred by Dr. Daneen Schick  HPI: [name] is a 74 y.o. male who has been followed with carotid disease by cardiology. The left carotid stenosis progressed to greater than 80% and for this reason he was sent for carotid evaluation.  I have reviewed the records from [facility] heart care. The patient is undergone previous coronary revascularization in 1999 and has had multiple PCI since then. He had a redo CABG in 2015. As a history of congestive heart failure with an ejection fraction of 45-50% based on an echo done in November 2015. He also has a history of COPD, type 2 diabetes, and obstructive sleep apnea. In addition he has stage III chronic kidney disease and polymyalgia rheumatica.  His most recent heart attack was in November 2015 He is also been admitted with congestive heart failure. He states that he  Had some slight chest pain during cardiac rehabilitation just this last Friday but his EKG was reportedly okay.  He is on aspirin, a statin, and Plavix.  Past Medical History  Diagnosis Date  . Carotid artery occlusion     a. Duplex 12/2013: mild plaque bilat, 40-59% BICA. F/u due 12/2014.  Marland Kitchen Hyperlipidemia   . Atherosclerosis of native arteries of the extremities with intermittent claudication     a. L external iliac stent 2001, bilateral SFA occlusions documented 2001.  Marland Kitchen Hypertension   . GERD (gastroesophageal reflux disease)   . Esophageal stricture     a. History of dilitation - Long-standing history of esophageal reflux   . 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 1999: LIMA to LAD, sequential SVG to diagonal 2 and diagonal 3, sequential SVG to PDA, acute marginal branch, and PL branch. b. Repeat CABG 2015 with SVG to D1, SVG to PDA, and SVG to OM.   Marland Kitchen CAD (coronary artery disease), native coronary artery  09/24/2013    a. CABG x 6 1999. b. Interim PCIs: Angioplasty SVG-PDA 2006.Taxus DES to native LCx in 2007.DES to SVG-PDA 2007, restented 2009.NSTEMI with PCI to SVG- PDA complicated by no-reflow/inferior infarction in 2012. c. Redo CABG 2015 with SVG-PD, SVG-D1, SVG-OM1.  . Chronic combined systolic and diastolic CHF (congestive heart failure)     a. Prior EF 45-50% in 08/2014. b. Improved EF >55% in 09/2014 at [facility].  . MI (myocardial infarction) 1999-02/2014  . OSA (obstructive sleep apnea)     "don't wear mask" (08/26/2014)  . Type II diabetes mellitus   . Anemia     a. + FOBT 08/2014.  Marland Kitchen History of hiatal hernia   . Daily headache   . Arthritis   . Chronic lower back pain   . CKD (chronic kidney disease) stage 3, GFR 30-59 ml/min   . Skin cancer   . Hypotonic bladder     Requiring in and out catheterization performed by the patient at home - occasional blood tinged urine (self-cath's daily for the last 2 years) and hematuria is chronic for him, followed by urology.  . Polymyalgia rheumatica   . SVT (supraventricular tachycardia)   . COPD (chronic obstructive pulmonary disease)   . Atrial fibrillation    Family History  Problem Relation Age of Onset  . CAD Mother   . Heart disease Mother   . Hypertension Mother   . Cirrhosis Brother   . Heart disease  Father   . Hyperlipidemia Father   . Hypertension Father   . Heart attack Father    SOCIAL HISTORY: History  Substance Use Topics  . Smoking status: Former Smoker -- 1.00 packs/day for 50 years    Types: Cigarettes    Quit date: 02/25/2014  . Smokeless tobacco: Never Used  . Alcohol Use: No   No Known Allergies Current Outpatient Prescriptions  Medication Sig Dispense Refill  . acetaminophen (TYLENOL) 325 MG tablet Take 650 mg by mouth every 6 (six) hours as needed for moderate pain.    Marland Kitchen amLODipine (NORVASC) 10 MG tablet Take 10 mg by mouth daily.    Marland Kitchen aspirin EC 81 MG EC tablet Take 1 tablet (81 mg total) by mouth  daily. 1 tablet 0  . atorvastatin (LIPITOR) 20 MG tablet Take 20 mg by mouth daily at 6 PM.     . cholecalciferol (VITAMIN D) 1000 UNITS tablet Take 1,000 Units by mouth daily.    . clopidogrel (PLAVIX) 75 MG tablet Take 1 tablet (75 mg total) by mouth daily. 90 tablet 3  . ferrous sulfate 325 (65 FE) MG tablet Take 325 mg by mouth daily with breakfast.    . glipiZIDE (GLUCOTROL) 10 MG tablet Take 1 tablet (10 mg total) by mouth 2 (two) times daily before a meal. 60 tablet 0  . hydrALAZINE (APRESOLINE) 50 MG tablet Take 50 mg by mouth 3 (three) times daily.    . Insulin Glargine (LANTUS) 100 UNIT/ML Solostar Pen Inject 10 Units into the skin every morning. 15 mL 1  . Insulin Pen Needle (FIFTY50 PEN NEEDLES) 31G X 5 MM MISC As directed 30 each 1  . irbesartan (AVAPRO) 300 MG tablet Take 150 mg by mouth daily. Taking one half tablet by mouth daily    . isosorbide mononitrate (IMDUR) 120 MG 24 hr tablet Take 1 tablet (120 mg total) by mouth daily. 30 tablet 5  . metoprolol (LOPRESSOR) 50 MG tablet Take 1 tablet (50 mg total) by mouth 2 (two) times daily. 60 tablet 11  . Multiple Vitamins-Minerals (MULTIVITAMIN WITH MINERALS) tablet Take 1 tablet by mouth daily.    . nitroGLYCERIN (NITROSTAT) 0.4 MG SL tablet Place 0.4 mg under the tongue every 5 (five) minutes as needed for chest pain.    . pantoprazole (PROTONIX) 40 MG tablet Take 1 tablet (40 mg total) by mouth daily. 90 tablet 3  . torsemide (DEMADEX) 20 MG tablet Take 2 tablets by mouth once a day    . levofloxacin (LEVAQUIN) 500 MG tablet Take 1 tablet by mouth daily.  0   No current facility-administered medications for this visit.   REVIEW OF SYSTEMS: Valu.Nieves ] denotes positive finding; [  ] denotes negative finding  CARDIOVASCULAR:  [ ]  chest pain   [ ]  chest pressure   [ ]  palpitations   [ ]  orthopnea   [ ]  dyspnea on exertion   Valu.Nieves ] claudication   Valu.Nieves ] rest pain   [ ]  DVT   [ ]  phlebitis PULMONARY:   [ ]  productive cough   [ ]  asthma   [ ]   wheezing NEUROLOGIC:   [ ]  weakness  [ ]  paresthesias  [ ]  aphasia  [ ]  amaurosis  [ ]  dizziness HEMATOLOGIC:   [ ]  bleeding problems   [ ]  clotting disorders MUSCULOSKELETAL:  [ ]  joint pain   [ ]  joint swelling [ ]  leg swelling GASTROINTESTINAL: [ ]   blood in stool  [ ]   hematemesis GENITOURINARY:  [ ]   dysuria  [ ]   hematuria PSYCHIATRIC:  [ ]  history of major depression INTEGUMENTARY:  [ ]  rashes  [ ]  ulcers CONSTITUTIONAL:  [ ]  fever   [ ]  chills  PHYSICAL EXAM: Filed Vitals:   01/01/15 1452 01/01/15 1459  BP: 152/48 138/37  Pulse: 48   Height: 5\' 6"  (1.676 m)   Weight: 163 lb (73.936 kg)   SpO2: 98%    Body mass index is 26.32 kg/(m^2). GENERAL: The patient is a well-nourished male, in no acute distress. The vital signs are documented above. CARDIOVASCULAR: There is a regular rate and rhythm. He has bilateral carotid bruits. I cannot palpate femoral pulses. I cannot palpate pedal pulses. PULMONARY: There is good air exchange bilaterally without wheezing or rales. ABDOMEN: Soft and non-tender with normal pitched bowel sounds.  MUSCULOSKELETAL: There are no major deformities or cyanosis. NEUROLOGIC: No focal weakness or paresthesias are detected. SKIN: There are no ulcers or rashes noted. PSYCHIATRIC: The patient has a normal affect.  DATA:  I have reviewed his carotid duplex scan that was done on 12/24/2014 on [employer]. This showed a 40-59% right carotid stenosis with a greater than 80% left carotid stenosis. The plaque extended up into the mid internal carotid artery on the left.  I have independently interpreted the limited carotid duplex scan done in our office today of the left carotid system. There is a greater than 80% left carotid stenosis. This extends up proximally 2.3 cm into the internal carotid artery above the bifurcation.  I reviewed his aortogram from February 10 which shows diffuse aortoiliac occlusive disease.  MEDICAL ISSUES:  GREATER THAN 80% LEFT  CAROTID STENOSIS: This patient has a greater than 80% asymptomatic left carotid stenosis. The stenosis appears to extend fairly high up the internal carotid artery. He has a significant cardiac history. He had a myocardial infarction November 2015 and is also been admitted with congestive heart failure since then. He had chest pain and cardiac rehabilitation last week. I discussed the case with Dr. Fletcher Anon over the phone today and we both agree that it would be safest to obtain preoperative cardiac workup prior to considering left carotid endarterectomy. His primary cardiologist is Dr. Pernell Dupre and we will arrange for an appointment with Dr. Tamala Julian. If he is felt to be very high-risk for surgery he could potentially be considered for a carotid stent. If he is considered a candidate for surgery ideally we would like to stop his Plavix 1 week preoperatively. If however we could not safely stop his Plavix we could consider proceeding with carotid endarterectomy with a slightly higher risk of bleeding. I'll make further recommendations pending his workup by Dr. Tamala Julian. I have reviewed the indications for carotid endarterectomy, that is to lower the risk of future stroke. I have also reviewed the potential complications of surgery, including but not limited to: bleeding, stroke (perioperative risk 1-2%), MI, nerve injury of other unpredictable medical problems.   [place] [facility] [place]: [phone]

## 2015-01-01 NOTE — Telephone Encounter (Signed)
nwe   Request for surgical clearance:  1. What type of surgery is being performed? CEA  2. When is this surgery scheduled? Pending   3. Are there any medications that need to be held prior to surgery and how long? Office stating no . From last office visit 3.1.2016 can pt be clear for surgery.    4. Name of physician performing surgery?  [facility]   5. What is your office phone and fax number? [phone] / fax fax [phone]

## 2015-01-01 NOTE — Telephone Encounter (Signed)
I will forward this message to Dr Fletcher Anon for review of surgical clearance request.

## 2015-01-02 NOTE — Telephone Encounter (Signed)
F/U       Juliann Pulse from [facility] calling, states pt no longer needs clearance from Dr. Fletcher Anon.  Dr. Scot Dock prefers for pt to be cleared by Dr. Tamala Julian, states he and Dr. Tamala Julian have discussed this.  Pt now needs an appt sooner than June, which is next opening for Dr. Tamala Julian.  Can this be arranged?  Please have Dr. Tamala Julian or nurse return call to [phone]

## 2015-01-02 NOTE — Telephone Encounter (Signed)
Cardiac clearance request routed to Dr.Smith to advise.

## 2015-01-03 ENCOUNTER — Other Ambulatory Visit (HOSPITAL_COMMUNITY): Payer: Self-pay | Admitting: *Deleted

## 2015-01-03 ENCOUNTER — Telehealth: Payer: Self-pay | Admitting: Interventional Cardiology

## 2015-01-03 ENCOUNTER — Encounter (HOSPITAL_COMMUNITY)
Admission: RE | Admit: 2015-01-03 | Discharge: 2015-01-03 | Disposition: A | Discharge status: Home / Self Care | Payer: Medicare Other | Source: Ambulatory Visit | Attending: Interventional Cardiology | Admitting: Interventional Cardiology

## 2015-01-03 DIAGNOSIS — Z5189 Encounter for other specified aftercare: Secondary | ICD-10-CM | POA: Diagnosis not present

## 2015-01-03 DIAGNOSIS — Z951 Presence of aortocoronary bypass graft: Secondary | ICD-10-CM | POA: Diagnosis not present

## 2015-01-03 DIAGNOSIS — I252 Old myocardial infarction: Secondary | ICD-10-CM | POA: Diagnosis not present

## 2015-01-03 NOTE — Telephone Encounter (Signed)
New message      Returning Lisa's call

## 2015-01-03 NOTE — Telephone Encounter (Signed)
Lm that [name], Dr. Thompson Caul  nurse is not in office today but she has sent Dr. Tamala Julian a note for clearance.

## 2015-01-06 ENCOUNTER — Ambulatory Visit (HOSPITAL_COMMUNITY)
Admission: RE | Admit: 2015-01-06 | Discharge: 2015-01-06 | Disposition: A | Discharge status: Home / Self Care | Payer: Medicare Other | Source: Ambulatory Visit | Attending: Nephrology | Admitting: Nephrology

## 2015-01-06 ENCOUNTER — Encounter (HOSPITAL_COMMUNITY)
Admission: RE | Admit: 2015-01-06 | Discharge: 2015-01-06 | Disposition: A | Discharge status: Home / Self Care | Payer: Medicare Other | Source: Ambulatory Visit | Attending: Interventional Cardiology | Admitting: Interventional Cardiology

## 2015-01-06 DIAGNOSIS — Z951 Presence of aortocoronary bypass graft: Secondary | ICD-10-CM | POA: Insufficient documentation

## 2015-01-06 DIAGNOSIS — D631 Anemia in chronic kidney disease: Secondary | ICD-10-CM | POA: Diagnosis not present

## 2015-01-06 DIAGNOSIS — N189 Chronic kidney disease, unspecified: Secondary | ICD-10-CM | POA: Diagnosis not present

## 2015-01-06 DIAGNOSIS — I252 Old myocardial infarction: Secondary | ICD-10-CM | POA: Diagnosis not present

## 2015-01-06 DIAGNOSIS — D509 Iron deficiency anemia, unspecified: Secondary | ICD-10-CM | POA: Insufficient documentation

## 2015-01-06 MED ORDER — SODIUM CHLORIDE 0.9 % IV SOLN
510.0000 mg | INTRAVENOUS | Status: AC
  Administered 2015-01-06: 510 mg via INTRAVENOUS
  Filled 2015-01-06: qty 17

## 2015-01-06 NOTE — Telephone Encounter (Signed)
returned pt call.adv him that Dr.Smith is aware that cardiac clearance.he will address the rqst today and I will call him back with Dr.Smith recommendation.pt verbalized understanding.

## 2015-01-06 NOTE — Telephone Encounter (Signed)
Follow up     Calling to see if you have scheduled him to see Dr Tamala Julian for clearance?

## 2015-01-06 NOTE — Telephone Encounter (Signed)
New Message       Dr. Nicole Cella office calling to try to get pt in sooner for surgical clearance. Office stating that Dr. Scot Dock spoke with Dr. Tamala Julian about pt being seen sooner and they were told to ask for Dr. Thompson Caul scheduler. Offered Dr. Thompson Caul next available appt which is 01-30-15. They state that is not soon enough and want an earlier appt for pt. Please call back and advise.

## 2015-01-06 NOTE — Telephone Encounter (Signed)
Patient is cleared for the upcoming carotid endarterectomy. 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 He has had a relatively recent non-ST elevation myocardial infarction. It would be optimal if the patient could remain on Plavix during surgery but this will be left to Dr. Nicole Cella discretion. He will be at high risk for cardiovascular complications with recurrent ischemic events being the most likely. He is currently stable and her to dissipate and in cardiac rehabilitation with stable angina pectoris.

## 2015-01-07 NOTE — Telephone Encounter (Signed)
Juliann Pulse @ [facility] aware

## 2015-01-08 ENCOUNTER — Encounter (HOSPITAL_COMMUNITY): Payer: Medicare Other

## 2015-01-10 ENCOUNTER — Encounter (HOSPITAL_COMMUNITY)
Admission: RE | Admit: 2015-01-10 | Discharge: 2015-01-10 | Disposition: A | Discharge status: Home / Self Care | Payer: Medicare Other | Source: Ambulatory Visit | Attending: Interventional Cardiology | Admitting: Interventional Cardiology

## 2015-01-10 ENCOUNTER — Encounter: Payer: Self-pay | Admitting: Cardiology

## 2015-01-10 DIAGNOSIS — I252 Old myocardial infarction: Secondary | ICD-10-CM | POA: Diagnosis not present

## 2015-01-10 DIAGNOSIS — Z5189 Encounter for other specified aftercare: Secondary | ICD-10-CM | POA: Diagnosis not present

## 2015-01-10 DIAGNOSIS — Z951 Presence of aortocoronary bypass graft: Secondary | ICD-10-CM | POA: Diagnosis not present

## 2015-01-10 NOTE — Progress Notes (Signed)
I received a phone call today from cardiac rehabilitation. The patient exercised well. After rehabilitation his heart rate slowed into the range of 40. He had no symptoms. This was not a usual response for him. When I received the call his heart rate was back at 67.  The patient is on a beta blocker. However I decided not to change his medications. He is scheduled to be seen back in the office soon.  Daryel November, MD

## 2015-01-13 ENCOUNTER — Other Ambulatory Visit: Payer: Self-pay

## 2015-01-13 ENCOUNTER — Encounter (HOSPITAL_COMMUNITY)
Admission: RE | Admit: 2015-01-13 | Discharge: 2015-01-13 | Disposition: A | Discharge status: Home / Self Care | Payer: Medicare Other | Source: Ambulatory Visit | Attending: Interventional Cardiology | Admitting: Interventional Cardiology

## 2015-01-13 DIAGNOSIS — Z951 Presence of aortocoronary bypass graft: Secondary | ICD-10-CM | POA: Diagnosis not present

## 2015-01-13 DIAGNOSIS — Z5189 Encounter for other specified aftercare: Secondary | ICD-10-CM | POA: Diagnosis not present

## 2015-01-13 DIAGNOSIS — I252 Old myocardial infarction: Secondary | ICD-10-CM | POA: Diagnosis not present

## 2015-01-14 ENCOUNTER — Telehealth: Payer: Self-pay | Admitting: Interventional Cardiology

## 2015-01-14 DIAGNOSIS — D494 Neoplasm of unspecified behavior of bladder: Secondary | ICD-10-CM | POA: Diagnosis not present

## 2015-01-14 DIAGNOSIS — N323 Diverticulum of bladder: Secondary | ICD-10-CM | POA: Diagnosis not present

## 2015-01-14 DIAGNOSIS — R8290 Unspecified abnormal findings in urine: Secondary | ICD-10-CM | POA: Diagnosis not present

## 2015-01-14 DIAGNOSIS — R312 Other microscopic hematuria: Secondary | ICD-10-CM | POA: Diagnosis not present

## 2015-01-14 DIAGNOSIS — N312 Flaccid neuropathic bladder, not elsewhere classified: Secondary | ICD-10-CM | POA: Diagnosis not present

## 2015-01-14 NOTE — Telephone Encounter (Signed)
New Message   Patient needs to know if it is ok to come off Plavix for his upcoming surgery. Please give patient a call.

## 2015-01-14 NOTE — Telephone Encounter (Signed)
New message     Request for surgical clearance:  1. 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 When is this surgery scheduled? Pending - before neck surgery    3. Are there any medications that need to be held prior to surgery and how long? hold plavix  7 days * asa  5 days   4. Name of physician performing surgery? Dr. Karsten Ro   5. What is your office phone and fax number? [phone]  Ext [phone] . Fax [phone]

## 2015-01-14 NOTE — Telephone Encounter (Signed)
Called pt and informed him that [facility] contacted our office this morning in regards to holding Plavix and ASA for surgery. Informed pt that Dr. Tamala Julian is out of the office today but that information was sent over to him for review and advisement. Informed pt that once we hear back from him someone from our office will let him know what Dr. Thompson Caul suggestions are and we will get information sent to [facility] as well.  Pt verbalized understanding and was in agreement with this plan.

## 2015-01-14 NOTE — Telephone Encounter (Signed)
Will forward to Dr. Tamala Julian for review and advisement

## 2015-01-14 NOTE — Telephone Encounter (Signed)
The patient has complex coronary artery disease. He has had 2 prior coronary bypass graft surgical procedures. He has had 2 relatively recent hospitalizations over the last 6 months with unstable angina following his second surgical procedure. He is on aggressive medical therapy as the most prudent treatment option.  The patient is cleared for the upcoming urologic procedure but will be at at high-risk for cardiac complications. It will be okay to discontinue Plavix for 7 days and aspirin for 5 days prior to the procedure. He's had no recent stent implantations.

## 2015-01-14 NOTE — Telephone Encounter (Signed)
Will fax orders to [facility]. Informed pt of Dr. Thompson Caul clearance and that he is at high risk for cardiac complications. Informed pt ok to stop Plavix for 7 days and ASA for 5 days prior to surgery. Pt verbalized understanding and was in agreement with this plan.

## 2015-01-15 ENCOUNTER — Other Ambulatory Visit: Payer: Self-pay | Admitting: Urology

## 2015-01-15 ENCOUNTER — Encounter (HOSPITAL_COMMUNITY)
Admission: RE | Admit: 2015-01-15 | Discharge: 2015-01-15 | Disposition: A | Discharge status: Home / Self Care | Payer: Medicare Other | Source: Ambulatory Visit | Attending: Interventional Cardiology | Admitting: Interventional Cardiology

## 2015-01-15 DIAGNOSIS — H4323 Crystalline deposits in vitreous body, bilateral: Secondary | ICD-10-CM | POA: Diagnosis not present

## 2015-01-15 DIAGNOSIS — Z951 Presence of aortocoronary bypass graft: Secondary | ICD-10-CM | POA: Diagnosis not present

## 2015-01-15 DIAGNOSIS — E119 Type 2 diabetes mellitus without complications: Secondary | ICD-10-CM | POA: Diagnosis not present

## 2015-01-15 DIAGNOSIS — Z5189 Encounter for other specified aftercare: Secondary | ICD-10-CM | POA: Diagnosis not present

## 2015-01-15 DIAGNOSIS — H18332 Rupture in Descemet's membrane, left eye: Secondary | ICD-10-CM | POA: Diagnosis not present

## 2015-01-15 DIAGNOSIS — I252 Old myocardial infarction: Secondary | ICD-10-CM | POA: Diagnosis not present

## 2015-01-15 DIAGNOSIS — Z961 Presence of intraocular lens: Secondary | ICD-10-CM | POA: Diagnosis not present

## 2015-01-16 ENCOUNTER — Other Ambulatory Visit: Payer: Self-pay | Admitting: Urology

## 2015-01-16 ENCOUNTER — Encounter: Payer: Self-pay | Admitting: Interventional Cardiology

## 2015-01-16 MED ORDER — LEVOFLOXACIN IN D5W 750 MG/150ML IV SOLN: 400.0000 mg | INTRAVENOUS | Status: DC

## 2015-01-16 NOTE — Progress Notes (Signed)
Called Pam gibson ( surgery Scheduler ) and left her a message regarding no preop orders in epic and requested orders.  Also no clearance in EPIC for surgery.  Called and requested any clearances for surgery scheduled on 01/23/2015 be faxed to [phone]

## 2015-01-16 NOTE — Progress Notes (Signed)
Clearance for surgery on chart from Dr Daneen Schick dated 01/14/2015.

## 2015-01-16 NOTE — Patient Instructions (Addendum)
Jovian C Hogg  01/16/2015   Your procedure is scheduled on:  01/23/2015    Report to [facility] Main  Entrance and follow signs to               [place] at     [place].  Call this number if you have problems the morning of surgery [phone]   Remember: Eat a good healthy snack prior to bedtime.    Do not eat food or drink liquids :After Midnight.               No diabetic medications am of surgery   Take these medicines the morning of surgery with A SIP OF WATER:  Amlodipine ( Norvasc). Apresoline ( hydralazine), Isosorbide mononitrate ( Imdur), Metoprolol ( Lopressor), protonix                                You may not have any metal on your body including hair pins and              piercings  Do not wear jewelry, , lotions, powders or perfumes., deodorant.                         Men may shave face and neck.   Do not bring valuables to the hospital. [place].  Contacts, dentures or bridgework may not be worn into surgery.      Patients discharged the day of surgery will not be allowed to drive home.  Name and phone number of your driver:  Special Instructions: coughing and deep breathing exercises, leg exercises               Please read over the following fact sheets you were given: _____________________________________________________________________             [facility] - Preparing for Surgery Before surgery, you can play an important role.  Because skin is not sterile, your skin needs to be as free of germs as possible.  You can reduce the number of germs on your skin by washing with CHG (chlorahexidine gluconate) soap before surgery.  CHG is an antiseptic cleaner which kills germs and bonds with the skin to continue killing germs even after washing. Please DO NOT use if you have an allergy to CHG or antibacterial soaps.  If your skin becomes reddened/irritated stop using the CHG and  inform your nurse when you arrive at Short Stay. Do not shave (including legs and underarms) for at least 48 hours prior to the first CHG shower.  You may shave your face/neck. Please follow these instructions carefully:  1.  Shower with CHG Soap the night before surgery and the  morning of Surgery.  2.  If you choose to wash your hair, wash your hair first as usual with your  normal  shampoo.  3.  After you shampoo, rinse your hair and body thoroughly to remove the  shampoo.                           4.  Use CHG as you would any other liquid soap.  You can apply chg directly  to  the skin and wash                       Gently with a scrungie or clean washcloth.  5.  Apply the CHG Soap to your body ONLY FROM THE NECK DOWN.   Do not use on face/ open                           Wound or open sores. Avoid contact with eyes, ears mouth and genitals (private parts).                       Wash face,  Genitals (private parts) with your normal soap.             6.  Wash thoroughly, paying special attention to the area where your surgery  will be performed.  7.  Thoroughly rinse your body with warm water from the neck down.  8.  DO NOT shower/wash with your normal soap after using and rinsing off  the CHG Soap.                9.  Pat yourself dry with a clean towel.            10.  Wear clean pajamas.            11.  Place clean sheets on your bed the night of your first shower and do not  sleep with pets. Day of Surgery : Do not apply any lotions/deodorants the morning of surgery.  Please wear clean clothes to the hospital/surgery center.  FAILURE TO FOLLOW THESE INSTRUCTIONS MAY RESULT IN THE CANCELLATION OF YOUR SURGERY PATIENT SIGNATURE_________________________________  NURSE SIGNATURE__________________________________  ________________________________________________________________________

## 2015-01-17 ENCOUNTER — Encounter (HOSPITAL_COMMUNITY): Payer: Medicare Other

## 2015-01-17 ENCOUNTER — Encounter (HOSPITAL_COMMUNITY)
Admission: RE | Admit: 2015-01-17 | Discharge: 2015-01-17 | Disposition: A | Discharge status: Home / Self Care | Payer: Medicare Other | Source: Ambulatory Visit | Attending: Urology | Admitting: Urology

## 2015-01-17 ENCOUNTER — Inpatient Hospital Stay (HOSPITAL_COMMUNITY): Admission: RE | Admit: 2015-01-17 | Payer: Medicare Other | Source: Ambulatory Visit

## 2015-01-17 ENCOUNTER — Ambulatory Visit (HOSPITAL_COMMUNITY)
Admission: RE | Admit: 2015-01-17 | Discharge: 2015-01-17 | Disposition: A | Discharge status: Home / Self Care | Payer: Medicare Other | Source: Ambulatory Visit | Attending: Anesthesiology | Admitting: Anesthesiology

## 2015-01-17 ENCOUNTER — Encounter (HOSPITAL_COMMUNITY): Payer: Self-pay

## 2015-01-17 DIAGNOSIS — Z01811 Encounter for preprocedural respiratory examination: Secondary | ICD-10-CM | POA: Diagnosis not present

## 2015-01-17 DIAGNOSIS — R05 Cough: Secondary | ICD-10-CM | POA: Diagnosis not present

## 2015-01-17 DIAGNOSIS — Z01818 Encounter for other preprocedural examination: Secondary | ICD-10-CM

## 2015-01-17 LAB — BASIC METABOLIC PANEL
ANION GAP: 12 (ref 5–15)
BUN: 56 mg/dL — ABNORMAL HIGH (ref 6–23)
CO2: 25 mmol/L (ref 19–32)
CREATININE: 2.23 mg/dL — AB (ref 0.50–1.35)
Calcium: 9.5 mg/dL (ref 8.4–10.5)
Chloride: 97 mmol/L (ref 96–112)
GFR calc non Af Amer: 28 mL/min — ABNORMAL LOW (ref >90)
GFR, EST AFRICAN AMERICAN: 32 mL/min — AB (ref >90)
Glucose, Bld: 279 mg/dL — ABNORMAL HIGH (ref 70–99)
POTASSIUM: 5.2 mmol/L — AB (ref 3.5–5.1)
Sodium: 134 mmol/L — ABNORMAL LOW (ref 135–145)

## 2015-01-17 LAB — CBC
HCT: 34.9 % — ABNORMAL LOW (ref 39.0–52.0)
HEMOGLOBIN: 11.2 g/dL — AB (ref 13.0–17.0)
MCH: 25.6 pg — ABNORMAL LOW (ref 26.0–34.0)
MCHC: 32.1 g/dL (ref 30.0–36.0)
MCV: 79.9 fL (ref 78.0–100.0)
PLATELETS: 236 10*3/uL (ref 150–400)
RBC: 4.37 MIL/uL (ref 4.22–5.81)
RDW: 19.4 % — ABNORMAL HIGH (ref 11.5–15.5)
WBC: 11.9 10*3/uL — AB (ref 4.0–10.5)

## 2015-01-17 NOTE — Progress Notes (Signed)
CBC and BMP results done 01/17/2015 faxed via EPIC to Dr Karsten Ro.

## 2015-01-17 NOTE — Progress Notes (Signed)
Called Dr Marcell Barlow for anesthesia consult due to extensive medical history and to include left CEA to be done on 01/30/2015 after cysto surgery on 01/23/2015.  Dr Marcell Barlow saw patient and wife.  Dr Marcell Barlow spoke with Dr Karsten Ro in [place] and decision made for cysto surgery after carotid surgery per Dr Marcell Barlow and Dr Karsten Ro.  Patient had already stopped Plavix and was due to stop Aspirin on 01/18/2015.  Both Dr Marcell Barlow and myself instructed patient to stay on Plavix and Aspirin and to restart Plavix today.  Patient and wife both voiced undertanding.  Patient and wife stated that Vascular surgeon stated he was to stay on Aspirin and Plavix for the upcoming carotid surgery on 01/30/2015.  Called office of [facility] and left message for Darrel Reach ( surgery scheduler) regarding cysto ( 01/23/2015 )  will be postponed until after carotid surgery per both Dr Karsten Ro and Dr Marcell Barlow and patient is aware and patient is aware that I have caller her and left her a message.

## 2015-01-17 NOTE — Progress Notes (Signed)
EKG- 12/27/2014 EPIC  Carotid Imaging 01/01/15 in EPIC.  ECHO- 09/05/14 EPIC  LOV with Vascular Surgery on 01/01/15.  LOV- with Dr Fletcher Anon- 12/17/2014 both in EPIC.

## 2015-01-20 ENCOUNTER — Encounter (HOSPITAL_COMMUNITY): Payer: Medicare Other

## 2015-01-20 DIAGNOSIS — R6889 Other general symptoms and signs: Secondary | ICD-10-CM | POA: Diagnosis not present

## 2015-01-20 DIAGNOSIS — N390 Urinary tract infection, site not specified: Secondary | ICD-10-CM | POA: Diagnosis not present

## 2015-01-20 DIAGNOSIS — J209 Acute bronchitis, unspecified: Secondary | ICD-10-CM | POA: Diagnosis not present

## 2015-01-22 ENCOUNTER — Encounter (HOSPITAL_COMMUNITY)
Admission: RE | Admit: 2015-01-22 | Discharge: 2015-01-22 | Disposition: A | Discharge status: Home / Self Care | Payer: Medicare Other | Source: Ambulatory Visit | Attending: Vascular Surgery | Admitting: Vascular Surgery

## 2015-01-22 ENCOUNTER — Encounter (HOSPITAL_COMMUNITY): Payer: Self-pay

## 2015-01-22 ENCOUNTER — Other Ambulatory Visit (HOSPITAL_COMMUNITY): Payer: Medicare Other

## 2015-01-22 ENCOUNTER — Encounter (HOSPITAL_COMMUNITY): Payer: Medicare Other

## 2015-01-22 DIAGNOSIS — I2510 Atherosclerotic heart disease of native coronary artery without angina pectoris: Secondary | ICD-10-CM | POA: Insufficient documentation

## 2015-01-22 DIAGNOSIS — I509 Heart failure, unspecified: Secondary | ICD-10-CM | POA: Insufficient documentation

## 2015-01-22 DIAGNOSIS — E785 Hyperlipidemia, unspecified: Secondary | ICD-10-CM | POA: Diagnosis not present

## 2015-01-22 DIAGNOSIS — I6522 Occlusion and stenosis of left carotid artery: Secondary | ICD-10-CM | POA: Insufficient documentation

## 2015-01-22 DIAGNOSIS — J449 Chronic obstructive pulmonary disease, unspecified: Secondary | ICD-10-CM | POA: Diagnosis not present

## 2015-01-22 DIAGNOSIS — N183 Chronic kidney disease, stage 3 (moderate): Secondary | ICD-10-CM | POA: Insufficient documentation

## 2015-01-22 DIAGNOSIS — E119 Type 2 diabetes mellitus without complications: Secondary | ICD-10-CM | POA: Insufficient documentation

## 2015-01-22 DIAGNOSIS — Z0183 Encounter for blood typing: Secondary | ICD-10-CM | POA: Diagnosis not present

## 2015-01-22 DIAGNOSIS — I4891 Unspecified atrial fibrillation: Secondary | ICD-10-CM | POA: Diagnosis not present

## 2015-01-22 DIAGNOSIS — Z7902 Long term (current) use of antithrombotics/antiplatelets: Secondary | ICD-10-CM | POA: Diagnosis not present

## 2015-01-22 DIAGNOSIS — Z01812 Encounter for preprocedural laboratory examination: Secondary | ICD-10-CM | POA: Diagnosis not present

## 2015-01-22 DIAGNOSIS — I129 Hypertensive chronic kidney disease with stage 1 through stage 4 chronic kidney disease, or unspecified chronic kidney disease: Secondary | ICD-10-CM | POA: Insufficient documentation

## 2015-01-22 DIAGNOSIS — Z7982 Long term (current) use of aspirin: Secondary | ICD-10-CM | POA: Insufficient documentation

## 2015-01-22 LAB — SURGICAL PCR SCREEN
MRSA, PCR: NEGATIVE
STAPHYLOCOCCUS AUREUS: NEGATIVE

## 2015-01-22 LAB — CBC
HCT: 39.3 % (ref 39.0–52.0)
Hemoglobin: 12.9 g/dL — ABNORMAL LOW (ref 13.0–17.0)
MCH: 25.8 pg — AB (ref 26.0–34.0)
MCHC: 32.8 g/dL (ref 30.0–36.0)
MCV: 78.6 fL (ref 78.0–100.0)
PLATELETS: 285 10*3/uL (ref 150–400)
RBC: 5 MIL/uL (ref 4.22–5.81)
RDW: 19.4 % — AB (ref 11.5–15.5)
WBC: 10.8 10*3/uL — ABNORMAL HIGH (ref 4.0–10.5)

## 2015-01-22 LAB — COMPREHENSIVE METABOLIC PANEL
ALT: 37 U/L (ref 0–53)
ANION GAP: 11 (ref 5–15)
AST: 31 U/L (ref 0–37)
Albumin: 3.8 g/dL (ref 3.5–5.2)
Alkaline Phosphatase: 78 U/L (ref 39–117)
BILIRUBIN TOTAL: 0.4 mg/dL (ref 0.3–1.2)
BUN: 47 mg/dL — AB (ref 6–23)
CHLORIDE: 100 mmol/L (ref 96–112)
CO2: 26 mmol/L (ref 19–32)
CREATININE: 2.26 mg/dL — AB (ref 0.50–1.35)
Calcium: 9.7 mg/dL (ref 8.4–10.5)
GFR calc Af Amer: 31 mL/min — ABNORMAL LOW (ref >90)
GFR, EST NON AFRICAN AMERICAN: 27 mL/min — AB (ref >90)
Glucose, Bld: 99 mg/dL (ref 70–99)
Potassium: 4.9 mmol/L (ref 3.5–5.1)
Sodium: 137 mmol/L (ref 135–145)
Total Protein: 7.8 g/dL (ref 6.0–8.3)

## 2015-01-22 LAB — URINALYSIS, ROUTINE W REFLEX MICROSCOPIC
Bilirubin Urine: NEGATIVE
GLUCOSE, UA: NEGATIVE mg/dL
HGB URINE DIPSTICK: NEGATIVE
Ketones, ur: NEGATIVE mg/dL
Leukocytes, UA: NEGATIVE
Nitrite: NEGATIVE
Protein, ur: NEGATIVE mg/dL
Specific Gravity, Urine: 1.011 (ref 1.005–1.030)
Urobilinogen, UA: 0.2 mg/dL (ref 0.0–1.0)
pH: 5 (ref 5.0–8.0)

## 2015-01-22 LAB — TYPE AND SCREEN
ABO/RH(D): O POS
ANTIBODY SCREEN: NEGATIVE

## 2015-01-22 LAB — APTT: aPTT: 30 seconds (ref 24–37)

## 2015-01-22 LAB — PROTIME-INR
INR: 1.03 (ref 0.00–1.49)
Prothrombin Time: 13.6 seconds (ref 11.6–15.2)

## 2015-01-22 NOTE — Pre-Procedure Instructions (Signed)
[name]  01/22/2015   Your procedure is scheduled on:  April 14th  Report to [facility] Admitting at 730 AM.  Call this number if you have problems the morning of surgery: [phone]   Remember:   Do not eat food or drink liquids after midnight.   Take these medicines the morning of surgery with A SIP OF WATER: amlodipine (norvasc), isosorbide mononitrate (Imdur), metoprolol (Lopressor), Pantoprazole (Protonix), Tylenol if needed   Do not wear jewelry, make-up or nail polish.  Do not wear lotions, powders, or perfumes. You may wear deodorant.  Do not shave 48 hours prior to surgery. Men may shave face and neck.  Do not bring valuables to the hospital.  [facility] is not responsible   for any belongings or valuables.               Contacts, dentures or bridgework may not be worn into surgery.  Leave suitcase in the car. After surgery it may be brought to your room.  For patients admitted to the hospital, discharge time is determined by your    treatment team.               Patients discharged the day of surgery will not be allowed to drive home.    Special Instructions: [facility] - Preparing for Surgery  Before surgery, you can play an important role.  Because skin is not sterile, your skin needs to be as free of germs as possible.  You can reduce the number of germs on you skin by washing with CHG (chlorahexidine gluconate) soap before surgery.  CHG is an antiseptic cleaner which kills germs and bonds with the skin to continue killing germs even after washing.  Please DO NOT use if you have an allergy to CHG or antibacterial soaps.  If your skin becomes reddened/irritated stop using the CHG and inform your nurse when you arrive at Short Stay.  Do not shave (including legs and underarms) for at least 48 hours prior to the first CHG shower.  You may shave your face.  Please follow these instructions carefully:   1.  Shower with CHG Soap the night before surgery  and the   morning of Surgery.  2.  If you choose to wash your hair, wash your hair first as usual with your  normal shampoo.  3.  After you shampoo, rinse your hair and body thoroughly to remove the Shampoo.  4.  Use CHG as you would any other liquid soap.  You can apply chg directly to the skin and wash gently with scrungie or a clean washcloth.  5.  Apply the CHG Soap to your body ONLY FROM THE NECK DOWN.  Do not use on open wounds or open sores.  Avoid contact with your eyes,  ears, mouth and genitals (private parts).  Wash genitals (private parts)   with your normal soap.  6.  Wash thoroughly, paying special attention to the area where your surgery  will be performed.  7.  Thoroughly rinse your body with warm water from the neck down.  8.  DO NOT shower/wash with your normal soap after using and rinsing off  the CHG Soap.  9.  Pat yourself dry with a clean towel.            10.  Wear clean pajamas.            11.  Place clean sheets on your bed the night of  your first shower and do not sleep with pets.  Day of Surgery  Do not apply any lotions/deoderants the morning of surgery.  Please wear clean clothes to the hospital/surgery center.      Please read over the following fact sheets that you were given: Pain Booklet, Coughing and Deep Breathing, Blood Transfusion Information, MRSA Information and Surgical Site Infection Prevention

## 2015-01-22 NOTE — Progress Notes (Signed)
PCP is Dr Shirline Frees Cardiologist is Dr Daneen Schick Pt states his last hgb a1c was 7.1 and that he was on prednisone when it was taken. He reports that he usually does not take his fasting blood sugar, but many months ago it was in the high 80's.  He reports a 178 cbg after lunch last week Pt reports he had mild sleep apnea, from sleep study many years ago. He no longer wears a CPAP. Pt not sure on if he should stop his Plavix or aspirin. Stephine at Dr Nicole Cella office called, states that she has already called the pt. He is to continue Plavix and Aspirin, but to hold both the morning of surgery.  [name] and his wife informed and voice understanding.

## 2015-01-23 ENCOUNTER — Other Ambulatory Visit (HOSPITAL_COMMUNITY): Payer: Medicare Other

## 2015-01-23 NOTE — Progress Notes (Addendum)
Anesthesia Chart Review:  Pt is 74 year old [name] scheduled for L CEA on David/14/2016 with Dr. Scot Dock.   PCP is Dr. Shirline Frees. Cardiologist Dr. Daneen Schick.   PMH includes: CAD (CABG 1999, multiple PCIs since, repeat CABG 02/2014), MI (1999, 2015), SVT, atrial fibrillation, CHF, HTN, DM, OSA, hyperlipidemia, CKD (stage 3), COPD, anemia. GERD. Former smoker. BMI 26.   Medications include: ASA, plavix, metoprolol, amlodipine, hydralazine, irbesartan, imdur, demadex. Pt to continue ASA and plavix but hold DOS.   Preoperative labs reviewed.  BUN 47, Cr 2.26. This is consistent with previous results over last David months.   Chest x-ray David/10/2014 reviewed. No acute abnormalities.   EKG 12/27/2014: Sinus bradycardia with marked sinus arrhythmia. Left axis deviation. RBBB. Inferior infarct, age undetermined.  Carotid duplex US 01/01/2015: -80-99% stenosis in L proximal/mid ICA roughly 2.3cm from the bifurcation.   Echo 08/26/2014: - Left ventricle: The cavity size was normal. Systolic function wasmildly reduced. The estimated ejection fraction was in the rangeof 45% to 50%. There is akinesis of the inferior myocardium.Features are consistent with a pseudonormal left ventricularfilling pattern, with concomitant abnormal relaxation andincreased filling pressure (grade 2 diastolic dysfunction). - Mitral valve: There was mild regurgitation. Impressions:- Compared to the prior study, there has been no significantinterval change.  Pt has cardiac clearance from Dr. Tamala Julian in [place] telephone encounter dated 01/06/2015. Pt is "at high risk for cardiac complications with recurrent ischemic events being the most likely."  Attempting to get records from Dr. Posey Pronto at [facility].   Willeen Cass, FNP-BC [facility] Short Stay Surgical Center/Anesthesiology Phone: [phone] David/04/2015 5:42 PM  Addendum:  Saw Dr. Posey Pronto with [facility] on 12/16/2014. Cr that visit was 2.32. Dx with CKD stage 3b.   If no  changes, I anticipate pt can proceed with surgery as scheduled.   Willeen Cass, FNP-BC [facility] Short Stay Surgical Center/Anesthesiology Phone: [phone] David/08/2015 David:16 PM

## 2015-01-24 ENCOUNTER — Encounter (HOSPITAL_COMMUNITY)
Admission: RE | Admit: 2015-01-24 | Discharge: 2015-01-24 | Disposition: A | Discharge status: Home / Self Care | Payer: Medicare Other | Source: Ambulatory Visit | Attending: Interventional Cardiology | Admitting: Interventional Cardiology

## 2015-01-24 DIAGNOSIS — Z5189 Encounter for other specified aftercare: Secondary | ICD-10-CM | POA: Diagnosis not present

## 2015-01-24 DIAGNOSIS — I252 Old myocardial infarction: Secondary | ICD-10-CM | POA: Diagnosis not present

## 2015-01-24 DIAGNOSIS — Z951 Presence of aortocoronary bypass graft: Secondary | ICD-10-CM | POA: Insufficient documentation

## 2015-01-27 ENCOUNTER — Encounter (HOSPITAL_COMMUNITY)
Admission: RE | Admit: 2015-01-27 | Discharge: 2015-01-27 | Disposition: A | Discharge status: Home / Self Care | Payer: Medicare Other | Source: Ambulatory Visit | Attending: Interventional Cardiology | Admitting: Interventional Cardiology

## 2015-01-28 ENCOUNTER — Other Ambulatory Visit: Payer: Self-pay | Admitting: Interventional Cardiology

## 2015-01-29 ENCOUNTER — Encounter (HOSPITAL_COMMUNITY)
Admission: RE | Admit: 2015-01-29 | Discharge: 2015-01-29 | Disposition: A | Discharge status: Home / Self Care | Payer: Medicare Other | Source: Ambulatory Visit | Attending: Interventional Cardiology | Admitting: Interventional Cardiology

## 2015-01-29 DIAGNOSIS — N183 Chronic kidney disease, stage 3 (moderate): Secondary | ICD-10-CM | POA: Diagnosis not present

## 2015-01-29 MED ORDER — DEXTROSE 5 % IV SOLN
1.5000 g | INTRAVENOUS | Status: AC
  Administered 2015-01-30: 1.5 g via INTRAVENOUS
  Filled 2015-01-29: qty 1.5

## 2015-01-29 MED ORDER — CHLORHEXIDINE GLUCONATE CLOTH 2 % EX PADS: 6.0000 | MEDICATED_PAD | Freq: Once | CUTANEOUS | Status: DC

## 2015-01-29 MED ORDER — SODIUM CHLORIDE 0.9 % IV SOLN: INTRAVENOUS | Status: DC

## 2015-01-29 NOTE — Progress Notes (Signed)
Pt graduated from cardiac rehab program today with completion of 24 exercise sessions in Phase II. Pt is exiting program early for scheduled carotid endarterectomy, followed by bladder surgery.  Pt maintained good attitude and participation as tolerated and able. Pt had frequent absences due to medical issues including viral respiratory illness and peripheral vascular procedure.  Pt progressed nicely during his participation in rehab as evidenced by increased MET level.   Medication list reconciled. Repeat  PHQ score- 0 .  Pt feels he has achieved his goals during cardiac rehab which includes increased strength and stamina as well as decreased claudication pain.   Pt plans to continue exercising on his own once recovery complete from scheduled surgeries.    Pt c/o dizziness and weakness at home yesterday.  Pt home BP:  89/54, therefore pt held his noon hydralazine dose.  Symptoms resolved. Pt continued to hold his hydralazine for the next two scheduled doses although repeat BP:  104-045 systolic and symptoms resolved.  PC to Dr. Thompson Caul office to inform.  Lm for nurse to return call.  In basket message sent to Dr. Tamala Julian. Pt instructed to continue hydralazine as directed unless [id number] systolic until further advised by Dr. Tamala Julian. Understanding verbalized.

## 2015-01-29 NOTE — Anesthesia Preprocedure Evaluation (Addendum)
Anesthesia Evaluation  Patient identified by MRN, date of birth, ID band Patient awake    Reviewed: Allergy & Precautions, H&P , NPO status , Patient's Chart, lab work & pertinent test results, reviewed documented beta blocker date and time   History of Anesthesia Complications Negative for: history of anesthetic complications  Airway Mallampati: III  TM Distance: >3 FB Neck ROM: Full    Dental  (+) Edentulous Upper, Edentulous Lower   Pulmonary sleep apnea and Continuous Positive Airway Pressure Ventilation , COPDCurrent Smoker, former smoker,  breath sounds clear to auscultation        Cardiovascular hypertension, Pt. on medications and Pt. on home beta blockers + CAD, + Past MI, + Cardiac Stents, + CABG (times six 1999), + Peripheral Vascular Disease and +CHF Rhythm:Regular Rate:Normal  Echo 08/2014 - Left ventricle: The cavity size was normal. Systolic function wasmildly reduced. The estimated ejection fraction was in the rangeof 45% to 50%. There is akinesis of the inferior myocardium.Features are consistent with a pseudonormal left ventricularfilling pattern, with concomitant abnormal relaxation andincreased filling pressure (grade 2 diastolic dysfunction). - Mitral valve: There was mild regurgitation.    Neuro/Psych  Headaches, PSYCHIATRIC DISORDERS Bilateral carotid artery stenosis    GI/Hepatic Neg liver ROS, hiatal hernia, GERD-  Medicated and Controlled,  Endo/Other  diabetes, Type 2, Oral Hypoglycemic Agents  Renal/GU Renal InsufficiencyRenal diseaseBaseline creatinine 1.5     Musculoskeletal  (+) Arthritis -,   Abdominal   Peds  Hematology  (+) anemia ,   Anesthesia Other Findings   Reproductive/Obstetrics                         Anesthesia Physical  Anesthesia Plan  ASA: III  Anesthesia Plan: General   Post-op Pain Management:    Induction: Intravenous  Airway  Management Planned: Oral ETT  Additional Equipment: Arterial line  Intra-op Plan:   Post-operative Plan: Extubation in OR  Informed Consent: I have reviewed the patients History and Physical, chart, labs and discussed the procedure including the risks, benefits and alternatives for the proposed anesthesia with the patient or authorized representative who has indicated his/her understanding and acceptance.   Dental advisory given  Plan Discussed with: CRNA  Anesthesia Plan Comments: (Remifentanil gtt)       Anesthesia Quick Evaluation

## 2015-01-29 NOTE — Progress Notes (Signed)
Pt.'s wife states that they are aware of change in surgery time,know that they are to arrive @0530 .

## 2015-01-30 ENCOUNTER — Encounter (HOSPITAL_COMMUNITY)
Admission: RE | Disposition: A | Discharge status: Home / Self Care | Payer: Self-pay | Source: Ambulatory Visit | Attending: Vascular Surgery

## 2015-01-30 ENCOUNTER — Inpatient Hospital Stay (HOSPITAL_COMMUNITY): Payer: Medicare Other | Admitting: Vascular Surgery

## 2015-01-30 ENCOUNTER — Inpatient Hospital Stay (HOSPITAL_COMMUNITY)
Admission: RE | Admit: 2015-01-30 | Discharge: 2015-01-31 | DRG: 038 | Disposition: A | Discharge status: Home / Self Care | Payer: Medicare Other | Source: Ambulatory Visit | Attending: Vascular Surgery | Admitting: Vascular Surgery

## 2015-01-30 ENCOUNTER — Inpatient Hospital Stay (HOSPITAL_COMMUNITY): Payer: Medicare Other

## 2015-01-30 ENCOUNTER — Encounter (HOSPITAL_COMMUNITY): Payer: Self-pay | Admitting: *Deleted

## 2015-01-30 ENCOUNTER — Inpatient Hospital Stay (HOSPITAL_COMMUNITY): Payer: Medicare Other | Admitting: Emergency Medicine

## 2015-01-30 DIAGNOSIS — E119 Type 2 diabetes mellitus without complications: Secondary | ICD-10-CM | POA: Diagnosis present

## 2015-01-30 DIAGNOSIS — N183 Chronic kidney disease, stage 3 (moderate): Secondary | ICD-10-CM | POA: Diagnosis present

## 2015-01-30 DIAGNOSIS — Z7902 Long term (current) use of antithrombotics/antiplatelets: Secondary | ICD-10-CM | POA: Diagnosis not present

## 2015-01-30 DIAGNOSIS — Z87891 Personal history of nicotine dependence: Secondary | ICD-10-CM | POA: Diagnosis not present

## 2015-01-30 DIAGNOSIS — I6522 Occlusion and stenosis of left carotid artery: Principal | ICD-10-CM | POA: Diagnosis present

## 2015-01-30 DIAGNOSIS — Z794 Long term (current) use of insulin: Secondary | ICD-10-CM

## 2015-01-30 DIAGNOSIS — I2510 Atherosclerotic heart disease of native coronary artery without angina pectoris: Secondary | ICD-10-CM | POA: Diagnosis present

## 2015-01-30 DIAGNOSIS — Z955 Presence of coronary angioplasty implant and graft: Secondary | ICD-10-CM | POA: Diagnosis not present

## 2015-01-30 DIAGNOSIS — I739 Peripheral vascular disease, unspecified: Secondary | ICD-10-CM | POA: Diagnosis present

## 2015-01-30 DIAGNOSIS — D649 Anemia, unspecified: Secondary | ICD-10-CM | POA: Diagnosis present

## 2015-01-30 DIAGNOSIS — M1990 Unspecified osteoarthritis, unspecified site: Secondary | ICD-10-CM | POA: Diagnosis present

## 2015-01-30 DIAGNOSIS — I4891 Unspecified atrial fibrillation: Secondary | ICD-10-CM | POA: Diagnosis present

## 2015-01-30 DIAGNOSIS — Z951 Presence of aortocoronary bypass graft: Secondary | ICD-10-CM

## 2015-01-30 DIAGNOSIS — I5042 Chronic combined systolic (congestive) and diastolic (congestive) heart failure: Secondary | ICD-10-CM | POA: Diagnosis present

## 2015-01-30 DIAGNOSIS — Z7982 Long term (current) use of aspirin: Secondary | ICD-10-CM

## 2015-01-30 DIAGNOSIS — G4733 Obstructive sleep apnea (adult) (pediatric): Secondary | ICD-10-CM | POA: Diagnosis present

## 2015-01-30 DIAGNOSIS — E785 Hyperlipidemia, unspecified: Secondary | ICD-10-CM | POA: Diagnosis present

## 2015-01-30 DIAGNOSIS — I129 Hypertensive chronic kidney disease with stage 1 through stage 4 chronic kidney disease, or unspecified chronic kidney disease: Secondary | ICD-10-CM | POA: Diagnosis present

## 2015-01-30 DIAGNOSIS — R51 Headache: Secondary | ICD-10-CM

## 2015-01-30 DIAGNOSIS — J029 Acute pharyngitis, unspecified: Secondary | ICD-10-CM | POA: Diagnosis not present

## 2015-01-30 DIAGNOSIS — Z79899 Other long term (current) drug therapy: Secondary | ICD-10-CM | POA: Diagnosis not present

## 2015-01-30 DIAGNOSIS — R519 Headache, unspecified: Secondary | ICD-10-CM

## 2015-01-30 DIAGNOSIS — Z95820 Peripheral vascular angioplasty status with implants and grafts: Secondary | ICD-10-CM | POA: Diagnosis not present

## 2015-01-30 DIAGNOSIS — J449 Chronic obstructive pulmonary disease, unspecified: Secondary | ICD-10-CM | POA: Diagnosis present

## 2015-01-30 DIAGNOSIS — K219 Gastro-esophageal reflux disease without esophagitis: Secondary | ICD-10-CM | POA: Diagnosis present

## 2015-01-30 DIAGNOSIS — I252 Old myocardial infarction: Secondary | ICD-10-CM | POA: Diagnosis not present

## 2015-01-30 HISTORY — PX: ENDARTERECTOMY: SHX5162

## 2015-01-30 LAB — GLUCOSE, CAPILLARY
Glucose-Capillary: 173 mg/dL — ABNORMAL HIGH (ref 70–99)
Glucose-Capillary: 172 mg/dL — ABNORMAL HIGH (ref 70–99)
Glucose-Capillary: 172 mg/dL — ABNORMAL HIGH (ref 70–99)

## 2015-01-30 SURGERY — ENDARTERECTOMY, CAROTID
Anesthesia: General | Site: Neck | Laterality: Left

## 2015-01-30 MED ORDER — GLIPIZIDE 10 MG PO TABS
10.0000 mg | ORAL_TABLET | Freq: Two times a day (BID) | ORAL | Status: DC
  Administered 2015-01-30 – 2015-01-31 (×2): 10 mg via ORAL
  Filled 2015-01-30 (×5): qty 1

## 2015-01-30 MED ORDER — POTASSIUM CHLORIDE CRYS ER 20 MEQ PO TBCR: 20.0000 meq | EXTENDED_RELEASE_TABLET | Freq: Every day | ORAL | Status: DC | PRN

## 2015-01-30 MED ORDER — AMLODIPINE BESYLATE 10 MG PO TABS
10.0000 mg | ORAL_TABLET | Freq: Every morning | ORAL | Status: DC
  Administered 2015-01-31: 10 mg via ORAL
  Filled 2015-01-30: qty 1

## 2015-01-30 MED ORDER — PHENYLEPHRINE HCL 10 MG/ML IJ SOLN
INTRAMUSCULAR | Status: DC | PRN
  Administered 2015-01-30: 20 ug via INTRAVENOUS

## 2015-01-30 MED ORDER — HYDRALAZINE HCL 20 MG/ML IJ SOLN: 5.0000 mg | INTRAMUSCULAR | Status: DC | PRN

## 2015-01-30 MED ORDER — ONDANSETRON HCL 4 MG/2ML IJ SOLN
INTRAMUSCULAR | Status: AC
  Filled 2015-01-30: qty 2

## 2015-01-30 MED ORDER — ATORVASTATIN CALCIUM 20 MG PO TABS
20.0000 mg | ORAL_TABLET | Freq: Every day | ORAL | Status: DC
  Administered 2015-01-30: 20 mg via ORAL
  Filled 2015-01-30 (×2): qty 1

## 2015-01-30 MED ORDER — METOPROLOL TARTRATE 50 MG PO TABS
50.0000 mg | ORAL_TABLET | Freq: Two times a day (BID) | ORAL | Status: DC
  Administered 2015-01-31: 50 mg via ORAL
  Filled 2015-01-30 (×3): qty 1

## 2015-01-30 MED ORDER — PROTAMINE SULFATE 10 MG/ML IV SOLN
INTRAVENOUS | Status: AC
  Filled 2015-01-30: qty 5

## 2015-01-30 MED ORDER — LACTATED RINGERS IV SOLN
INTRAVENOUS | Status: DC | PRN
  Administered 2015-01-30: 07:00:00 via INTRAVENOUS

## 2015-01-30 MED ORDER — ACETAMINOPHEN 325 MG PO TABS
650.0000 mg | ORAL_TABLET | Freq: Four times a day (QID) | ORAL | Status: DC | PRN
  Administered 2015-01-31: 650 mg via ORAL
  Filled 2015-01-30: qty 2

## 2015-01-30 MED ORDER — ONDANSETRON HCL 4 MG/2ML IJ SOLN: 4.0000 mg | Freq: Four times a day (QID) | INTRAMUSCULAR | Status: DC | PRN

## 2015-01-30 MED ORDER — HYDRALAZINE HCL 50 MG PO TABS
50.0000 mg | ORAL_TABLET | Freq: Three times a day (TID) | ORAL | Status: DC
  Filled 2015-01-30 (×5): qty 1

## 2015-01-30 MED ORDER — OXYCODONE HCL 5 MG PO TABS
ORAL_TABLET | ORAL | Status: AC
Start: 2015-01-30 — End: 2015-01-30
  Filled 2015-01-30: qty 1

## 2015-01-30 MED ORDER — LIDOCAINE HCL (CARDIAC) 20 MG/ML IV SOLN
INTRAVENOUS | Status: DC | PRN
  Administered 2015-01-30: 50 mg via INTRAVENOUS

## 2015-01-30 MED ORDER — LIDOCAINE HCL (CARDIAC) 20 MG/ML IV SOLN
INTRAVENOUS | Status: AC
  Filled 2015-01-30: qty 5

## 2015-01-30 MED ORDER — MEPERIDINE HCL 25 MG/ML IJ SOLN: 6.2500 mg | INTRAMUSCULAR | Status: DC | PRN

## 2015-01-30 MED ORDER — CLOPIDOGREL BISULFATE 75 MG PO TABS
75.0000 mg | ORAL_TABLET | Freq: Every day | ORAL | Status: DC
Start: 2015-01-31 — End: 2015-01-31
  Administered 2015-01-31: 75 mg via ORAL
  Filled 2015-01-30: qty 1

## 2015-01-30 MED ORDER — FENTANYL CITRATE 0.05 MG/ML IJ SOLN
INTRAMUSCULAR | Status: DC | PRN
  Administered 2015-01-30 (×2): 50 ug via INTRAVENOUS

## 2015-01-30 MED ORDER — SODIUM CHLORIDE 0.9 % IJ SOLN
INTRAMUSCULAR | Status: AC
  Filled 2015-01-30: qty 10

## 2015-01-30 MED ORDER — PHENYLEPHRINE 40 MCG/ML (10ML) SYRINGE FOR IV PUSH (FOR BLOOD PRESSURE SUPPORT)
PREFILLED_SYRINGE | INTRAVENOUS | Status: AC
  Filled 2015-01-30: qty 10

## 2015-01-30 MED ORDER — BISACODYL 10 MG RE SUPP: 10.0000 mg | Freq: Every day | RECTAL | Status: DC | PRN

## 2015-01-30 MED ORDER — INSULIN ASPART 100 UNIT/ML ~~LOC~~ SOLN
0.0000 [IU] | Freq: Three times a day (TID) | SUBCUTANEOUS | Status: DC
  Administered 2015-01-30 – 2015-01-31 (×2): 3 [IU] via SUBCUTANEOUS

## 2015-01-30 MED ORDER — PANTOPRAZOLE SODIUM 40 MG PO TBEC
40.0000 mg | DELAYED_RELEASE_TABLET | Freq: Every day | ORAL | Status: DC
  Administered 2015-01-30 – 2015-01-31 (×2): 40 mg via ORAL
  Filled 2015-01-30 (×2): qty 1

## 2015-01-30 MED ORDER — INSULIN GLARGINE 100 UNIT/ML SOLOSTAR PEN: 10.0000 [IU] | PEN_INJECTOR | Freq: Every day | SUBCUTANEOUS | Status: DC

## 2015-01-30 MED ORDER — PHENOL 1.4 % MT LIQD
1.0000 | OROMUCOSAL | Status: DC | PRN
  Administered 2015-01-30: 1 via OROMUCOSAL
  Filled 2015-01-30: qty 177

## 2015-01-30 MED ORDER — GUAIFENESIN-DM 100-10 MG/5ML PO SYRP: 15.0000 mL | ORAL_SOLUTION | ORAL | Status: DC | PRN

## 2015-01-30 MED ORDER — CEFUROXIME SODIUM 1.5 G IJ SOLR
1.5000 g | Freq: Two times a day (BID) | INTRAMUSCULAR | Status: AC
  Administered 2015-01-30 – 2015-01-31 (×2): 1.5 g via INTRAVENOUS
  Filled 2015-01-30 (×2): qty 1.5

## 2015-01-30 MED ORDER — ROCURONIUM BROMIDE 100 MG/10ML IV SOLN
INTRAVENOUS | Status: DC | PRN
  Administered 2015-01-30: 40 mg via INTRAVENOUS

## 2015-01-30 MED ORDER — GLYCOPYRROLATE 0.2 MG/ML IJ SOLN
INTRAMUSCULAR | Status: DC | PRN
  Administered 2015-01-30: .4 mg via INTRAVENOUS
  Administered 2015-01-30: 0.2 mg via INTRAVENOUS

## 2015-01-30 MED ORDER — ONDANSETRON HCL 4 MG/2ML IJ SOLN
INTRAMUSCULAR | Status: DC | PRN
  Administered 2015-01-30: 4 mg via INTRAVENOUS

## 2015-01-30 MED ORDER — ASPIRIN EC 81 MG PO TBEC
81.0000 mg | DELAYED_RELEASE_TABLET | Freq: Every day | ORAL | Status: DC
  Administered 2015-01-31: 81 mg via ORAL
  Filled 2015-01-30: qty 1

## 2015-01-30 MED ORDER — PROTAMINE SULFATE 10 MG/ML IV SOLN
INTRAVENOUS | Status: DC | PRN
  Administered 2015-01-30: 30 mg via INTRAVENOUS

## 2015-01-30 MED ORDER — NEOSTIGMINE METHYLSULFATE 10 MG/10ML IV SOLN
INTRAVENOUS | Status: AC
  Filled 2015-01-30: qty 1

## 2015-01-30 MED ORDER — MULTI-VITAMIN/MINERALS PO TABS: 1.0000 | ORAL_TABLET | Freq: Every day | ORAL | Status: DC

## 2015-01-30 MED ORDER — GLYCOPYRROLATE 0.2 MG/ML IJ SOLN
INTRAMUSCULAR | Status: AC
  Filled 2015-01-30: qty 2

## 2015-01-30 MED ORDER — ASPIRIN 81 MG PO CHEW
CHEWABLE_TABLET | ORAL | Status: AC
  Administered 2015-01-30: 81 mg via ORAL
  Filled 2015-01-30: qty 1

## 2015-01-30 MED ORDER — INSULIN GLARGINE 100 UNIT/ML ~~LOC~~ SOLN
10.0000 [IU] | Freq: Every day | SUBCUTANEOUS | Status: DC
  Administered 2015-01-30: 10 [IU] via SUBCUTANEOUS
  Filled 2015-01-30 (×2): qty 0.1

## 2015-01-30 MED ORDER — FENTANYL CITRATE 0.05 MG/ML IJ SOLN
INTRAMUSCULAR | Status: AC
  Filled 2015-01-30: qty 5

## 2015-01-30 MED ORDER — HYDROMORPHONE HCL 1 MG/ML IJ SOLN
0.2500 mg | INTRAMUSCULAR | Status: DC | PRN
  Administered 2015-01-30: 0.5 mg via INTRAVENOUS

## 2015-01-30 MED ORDER — OXYCODONE HCL 5 MG PO TABS
5.0000 mg | ORAL_TABLET | ORAL | Status: DC | PRN
  Administered 2015-01-30: 5 mg via ORAL

## 2015-01-30 MED ORDER — DOPAMINE-DEXTROSE 3.2-5 MG/ML-% IV SOLN
INTRAVENOUS | Status: AC
  Filled 2015-01-30: qty 250

## 2015-01-30 MED ORDER — OXYCODONE HCL 5 MG PO TABS: 5.0000 mg | ORAL_TABLET | Freq: Four times a day (QID) | ORAL | Status: DC | PRN

## 2015-01-30 MED ORDER — DEXAMETHASONE 4 MG PO TABS
4.0000 mg | ORAL_TABLET | Freq: Two times a day (BID) | ORAL | Status: AC
  Administered 2015-01-30: 4 mg via ORAL
  Filled 2015-01-30 (×3): qty 1

## 2015-01-30 MED ORDER — PROMETHAZINE HCL 25 MG/ML IJ SOLN: 6.2500 mg | INTRAMUSCULAR | Status: DC | PRN

## 2015-01-30 MED ORDER — NEOSTIGMINE METHYLSULFATE 10 MG/10ML IV SOLN
INTRAVENOUS | Status: DC | PRN
  Administered 2015-01-30: 3 mg via INTRAVENOUS

## 2015-01-30 MED ORDER — ROCURONIUM BROMIDE 50 MG/5ML IV SOLN
INTRAVENOUS | Status: AC
  Filled 2015-01-30: qty 1

## 2015-01-30 MED ORDER — GLYCOPYRROLATE 0.2 MG/ML IJ SOLN
INTRAMUSCULAR | Status: AC
  Filled 2015-01-30: qty 1

## 2015-01-30 MED ORDER — CETYLPYRIDINIUM CHLORIDE 0.05 % MT LIQD
7.0000 mL | Freq: Two times a day (BID) | OROMUCOSAL | Status: DC
  Administered 2015-01-30: 7 mL via OROMUCOSAL

## 2015-01-30 MED ORDER — PHENYLEPHRINE HCL 10 MG/ML IJ SOLN
10.0000 mg | INTRAVENOUS | Status: DC | PRN
  Administered 2015-01-30: 20 ug/min via INTRAVENOUS

## 2015-01-30 MED ORDER — HYDROMORPHONE HCL 1 MG/ML IJ SOLN
INTRAMUSCULAR | Status: AC
  Filled 2015-01-30: qty 1

## 2015-01-30 MED ORDER — SENNOSIDES-DOCUSATE SODIUM 8.6-50 MG PO TABS
1.0000 | ORAL_TABLET | Freq: Every evening | ORAL | Status: DC | PRN
  Filled 2015-01-30: qty 1

## 2015-01-30 MED ORDER — SODIUM CHLORIDE 0.9 % IV SOLN
INTRAVENOUS | Status: DC
  Administered 2015-01-30: 15:00:00 via INTRAVENOUS

## 2015-01-30 MED ORDER — LIDOCAINE HCL 4 % MT SOLN
OROMUCOSAL | Status: DC | PRN
  Administered 2015-01-30: 4 mL via TOPICAL

## 2015-01-30 MED ORDER — 0.9 % SODIUM CHLORIDE (POUR BTL) OPTIME
TOPICAL | Status: DC | PRN
  Administered 2015-01-30: 2000 mL

## 2015-01-30 MED ORDER — DEXAMETHASONE 4 MG PO TABS
4.0000 mg | ORAL_TABLET | ORAL | Status: AC
  Administered 2015-01-30: 4 mg via ORAL
  Filled 2015-01-30: qty 1

## 2015-01-30 MED ORDER — ADULT MULTIVITAMIN W/MINERALS CH
1.0000 | ORAL_TABLET | Freq: Every day | ORAL | Status: DC
  Filled 2015-01-30: qty 1

## 2015-01-30 MED ORDER — PROPOFOL 10 MG/ML IV BOLUS
INTRAVENOUS | Status: AC
  Filled 2015-01-30: qty 20

## 2015-01-30 MED ORDER — DOCUSATE SODIUM 100 MG PO CAPS
100.0000 mg | ORAL_CAPSULE | Freq: Every day | ORAL | Status: DC
  Administered 2015-01-31: 100 mg via ORAL
  Filled 2015-01-30: qty 1

## 2015-01-30 MED ORDER — HEPARIN SODIUM (PORCINE) 1000 UNIT/ML IJ SOLN
INTRAMUSCULAR | Status: AC
  Filled 2015-01-30: qty 1

## 2015-01-30 MED ORDER — ISOSORBIDE MONONITRATE ER 60 MG PO TB24
120.0000 mg | ORAL_TABLET | Freq: Every day | ORAL | Status: DC
  Administered 2015-01-31: 120 mg via ORAL
  Filled 2015-01-30: qty 2

## 2015-01-30 MED ORDER — DEXTRAN 40 IN SALINE 10-0.9 % IV SOLN
INTRAVENOUS | Status: DC | PRN
  Administered 2015-01-30: 1 mL

## 2015-01-30 MED ORDER — SODIUM CHLORIDE 0.9 % IV SOLN
0.0125 ug/kg/min | INTRAVENOUS | Status: AC
  Administered 2015-01-30: .05 ug/kg/min via INTRAVENOUS
  Filled 2015-01-30: qty 2000

## 2015-01-30 MED ORDER — EPHEDRINE SULFATE 50 MG/ML IJ SOLN
INTRAMUSCULAR | Status: AC
  Filled 2015-01-30: qty 1

## 2015-01-30 MED ORDER — SUCCINYLCHOLINE CHLORIDE 20 MG/ML IJ SOLN
INTRAMUSCULAR | Status: AC
  Filled 2015-01-30: qty 1

## 2015-01-30 MED ORDER — EPHEDRINE SULFATE 50 MG/ML IJ SOLN
INTRAMUSCULAR | Status: DC | PRN
  Administered 2015-01-30: 10 mg via INTRAVENOUS

## 2015-01-30 MED ORDER — VITAMIN D3 25 MCG (1000 UNIT) PO TABS
1000.0000 [IU] | ORAL_TABLET | Freq: Every day | ORAL | Status: DC
  Filled 2015-01-30: qty 1

## 2015-01-30 MED ORDER — LIDOCAINE-EPINEPHRINE (PF) 1 %-1:200000 IJ SOLN
INTRAMUSCULAR | Status: AC
  Filled 2015-01-30: qty 10

## 2015-01-30 MED ORDER — LIDOCAINE-EPINEPHRINE (PF) 1 %-1:200000 IJ SOLN
INTRAMUSCULAR | Status: DC | PRN
  Administered 2015-01-30: 30 mL

## 2015-01-30 MED ORDER — NITROGLYCERIN 0.4 MG SL SUBL
0.4000 mg | SUBLINGUAL_TABLET | SUBLINGUAL | Status: DC | PRN
Start: 2015-01-30 — End: 2015-01-31

## 2015-01-30 MED ORDER — MORPHINE SULFATE 2 MG/ML IJ SOLN: 2.0000 mg | INTRAMUSCULAR | Status: DC | PRN

## 2015-01-30 MED ORDER — FERROUS SULFATE 325 (65 FE) MG PO TABS
325.0000 mg | ORAL_TABLET | Freq: Every evening | ORAL | Status: DC
  Administered 2015-01-30: 325 mg via ORAL
  Filled 2015-01-30 (×2): qty 1

## 2015-01-30 MED ORDER — TORSEMIDE 20 MG PO TABS
40.0000 mg | ORAL_TABLET | Freq: Every day | ORAL | Status: DC
  Administered 2015-01-30 – 2015-01-31 (×2): 40 mg via ORAL
  Filled 2015-01-30 (×2): qty 2

## 2015-01-30 MED ORDER — DOPAMINE-DEXTROSE 3.2-5 MG/ML-% IV SOLN
2.0000 ug/kg/min | INTRAVENOUS | Status: DC
  Administered 2015-01-30: 3.673 ug/kg/min via INTRAVENOUS

## 2015-01-30 MED ORDER — SODIUM CHLORIDE 0.9 % IV SOLN
500.0000 mL | Freq: Once | INTRAVENOUS | Status: AC | PRN
  Administered 2015-01-30: 500 mL via INTRAVENOUS

## 2015-01-30 MED ORDER — LABETALOL HCL 5 MG/ML IV SOLN: 10.0000 mg | INTRAVENOUS | Status: DC | PRN

## 2015-01-30 MED ORDER — HEPARIN SODIUM (PORCINE) 1000 UNIT/ML IJ SOLN
INTRAMUSCULAR | Status: DC | PRN
  Administered 2015-01-30: 7000 [IU] via INTRAVENOUS

## 2015-01-30 MED ORDER — LIDOCAINE HCL (PF) 1 % IJ SOLN
INTRAMUSCULAR | Status: AC
  Filled 2015-01-30: qty 30

## 2015-01-30 MED ORDER — ALUM & MAG HYDROXIDE-SIMETH 200-200-20 MG/5ML PO SUSP: 15.0000 mL | ORAL | Status: DC | PRN

## 2015-01-30 MED ORDER — PROPOFOL 10 MG/ML IV BOLUS
INTRAVENOUS | Status: DC | PRN
  Administered 2015-01-30: 150 mg via INTRAVENOUS

## 2015-01-30 MED ORDER — DEXAMETHASONE SODIUM PHOSPHATE 4 MG/ML IJ SOLN
INTRAMUSCULAR | Status: AC
  Filled 2015-01-30: qty 1

## 2015-01-30 MED ORDER — SODIUM CHLORIDE 0.9 % IR SOLN
Status: DC | PRN
  Administered 2015-01-30: 08:00:00

## 2015-01-30 MED ORDER — METOPROLOL TARTRATE 1 MG/ML IV SOLN: 2.0000 mg | INTRAVENOUS | Status: DC | PRN

## 2015-01-30 SURGICAL SUPPLY — 47 items
BAG DECANTER FOR FLEXI CONT (MISCELLANEOUS) ×2 IMPLANT
CANISTER SUCTION 2500CC (MISCELLANEOUS) ×2 IMPLANT
CANNULA VESSEL 3MM 2 BLNT TIP (CANNULA) ×4 IMPLANT
CATH ROBINSON RED A/P 18FR (CATHETERS) ×2 IMPLANT
CLIP TI MEDIUM 24 (CLIP) ×2 IMPLANT
CLIP TI WIDE RED SMALL 24 (CLIP) ×2 IMPLANT
CRADLE DONUT ADULT HEAD (MISCELLANEOUS) ×2 IMPLANT
DRAIN CHANNEL 15F RND FF W/TCR (WOUND CARE) ×1 IMPLANT
ELECT REM PT RETURN 9FT ADLT (ELECTROSURGICAL) ×2
ELECTRODE REM PT RTRN 9FT ADLT (ELECTROSURGICAL) ×1 IMPLANT
EVACUATOR SILICONE 100CC (DRAIN) ×1 IMPLANT
GAUZE SPONGE 4X4 16PLY XRAY LF (GAUZE/BANDAGES/DRESSINGS) ×1 IMPLANT
GLOVE BIO SURGEON STRL SZ 6.5 (GLOVE) ×2 IMPLANT
GLOVE BIO SURGEON STRL SZ7.5 (GLOVE) ×2 IMPLANT
GLOVE BIOGEL PI IND STRL 7.5 (GLOVE) IMPLANT
GLOVE BIOGEL PI IND STRL 8 (GLOVE) ×1 IMPLANT
GLOVE BIOGEL PI INDICATOR 7.5 (GLOVE) ×1
GLOVE BIOGEL PI INDICATOR 8 (GLOVE) ×1
GLOVE SURG SS PI 6.5 STRL IVOR (GLOVE) ×2 IMPLANT
GOWN STRL REUS W/ TWL LRG LVL3 (GOWN DISPOSABLE) ×3 IMPLANT
GOWN STRL REUS W/ TWL XL LVL3 (GOWN DISPOSABLE) IMPLANT
GOWN STRL REUS W/TWL LRG LVL3 (GOWN DISPOSABLE) ×6
GOWN STRL REUS W/TWL XL LVL3 (GOWN DISPOSABLE) ×4
KIT BASIN OR (CUSTOM PROCEDURE TRAY) ×2 IMPLANT
KIT ROOM TURNOVER OR (KITS) ×2 IMPLANT
KIT SHUNT ARGYLE CAROTID ART 6 (VASCULAR PRODUCTS) ×1 IMPLANT
LIQUID BAND (GAUZE/BANDAGES/DRESSINGS) ×2 IMPLANT
NDL HYPO 25X1 1.5 SAFETY (NEEDLE) ×1 IMPLANT
NEEDLE HYPO 25X1 1.5 SAFETY (NEEDLE) ×2 IMPLANT
NS IRRIG 1000ML POUR BTL (IV SOLUTION) ×4 IMPLANT
PACK CAROTID (CUSTOM PROCEDURE TRAY) ×2 IMPLANT
PAD ARMBOARD 7.5X6 YLW CONV (MISCELLANEOUS) ×4 IMPLANT
SHUNT CAROTID BYPASS 10 (VASCULAR PRODUCTS) IMPLANT
SHUNT CAROTID BYPASS 12FRX15.5 (VASCULAR PRODUCTS) ×1 IMPLANT
SPONGE INTESTINAL PEANUT (DISPOSABLE) ×2 IMPLANT
SPONGE SURGIFOAM ABS GEL 100 (HEMOSTASIS) IMPLANT
SUT ETHILON 4 0 PS 2 18 (SUTURE) ×1 IMPLANT
SUT PROLENE 6 0 BV (SUTURE) ×5 IMPLANT
SUT PROLENE 7 0 BV 1 (SUTURE) IMPLANT
SUT SILK 2 0 FS (SUTURE) IMPLANT
SUT VIC AB 3-0 SH 27 (SUTURE) ×2
SUT VIC AB 3-0 SH 27X BRD (SUTURE) ×1 IMPLANT
SUT VICRYL 4-0 PS2 18IN ABS (SUTURE) ×2 IMPLANT
SYR 20CC LL (SYRINGE) ×2 IMPLANT
SYR CONTROL 10ML LL (SYRINGE) ×2 IMPLANT
TRAY CATH 16FR W/PLASTIC CATH (SET/KITS/TRAYS/PACK) ×1 IMPLANT
WATER STERILE IRR 1000ML POUR (IV SOLUTION) ×2 IMPLANT

## 2015-01-30 NOTE — Transfer of Care (Signed)
Immediate Anesthesia Transfer of Care Note  Patient: [name]  Procedure(s) Performed: Procedure(s): ENDARTERECTOMY CAROTID WITH PRIMARY CLOSURE OF ARTERY (Left)  Patient Location: PACU  Anesthesia Type:General  Level of Consciousness: awake, alert , oriented and patient cooperative  Airway & Oxygen Therapy: Patient Spontanous Breathing and Patient connected to nasal cannula oxygen  Post-op Assessment: Report given to RN, Post -op Vital signs reviewed and stable, Patient moving all extremities, Patient moving all extremities X 4 and Patient able to stick tongue midline  Post vital signs: Reviewed and stable  Last Vitals:  Filed Vitals:   01/30/15 1030  BP:   Pulse:   Temp: 36.7 C  Resp:     Complications: No apparent anesthesia complications

## 2015-01-30 NOTE — Progress Notes (Signed)
   VASCULAR SURGERY ASSESSMENT & PLAN:  * Stable post op. Developed post op headache, and I was worried about reperfusion syndrome given the severity of the disease.    * Headache much improved. Neuro's help appreciated. CT of head negative.   * Careful BP control   SUBJECTIVE: Headache better  PHYSICAL EXAM: Filed Vitals:   01/30/15 1315 01/30/15 1342 01/30/15 1411 01/30/15 1415  BP:  119/34 142/43   Pulse: 58 57  57  Temp:      TempSrc:      Resp: 18 9  14   Weight:      SpO2: 99% 95%  98%   NEURO: intact.   LABS: Lab Results  Component Value Date   WBC 10.8* 01/22/2015   HGB 12.9* 01/22/2015   HCT 39.3 01/22/2015   MCV 78.6 01/22/2015   PLT 285 01/22/2015   Lab Results  Component Value Date   CREATININE 2.26* 01/22/2015   Lab Results  Component Value Date   INR 1.03 01/22/2015   CBG (last 3)   Recent Labs  01/30/15 0553 01/30/15 1033  GLUCAP 173* 172*    Active Problems:   Left carotid artery stenosis    [name] Beeper: [phone] 01/30/2015

## 2015-01-30 NOTE — Interval H&P Note (Signed)
History and Physical Interval Note:  01/30/2015 7:10 AM  Taggart C Calip  has presented today for surgery, with the diagnosis of Left carotid artery stenosis I65.22  The various methods of treatment have been discussed with the patient and family. After consideration of risks, benefits and other options for treatment, the patient has consented to  Procedure(s): ENDARTERECTOMY CAROTID (Left) as a surgical intervention .  The patient's history has been reviewed, patient examined, no change in status, stable for surgery.  I have reviewed the patient's chart and labs.  Questions were answered to the patient's satisfaction.     Rokhaya Quinn S

## 2015-01-30 NOTE — Progress Notes (Signed)
To CT scan

## 2015-01-30 NOTE — Progress Notes (Signed)
Pt c/o "bad headache, throbbing" on left side of head. Dr Scot Dock here & aware. New order for po Decadron. Will cont to monitor closely. No apparent neuro deficit. Cuff SBP  95-100; art line 76-86. Dr Scot Dock here & aware, wants to go by arterial line. Order to give NS bolus 500cc (MR X 1) if needed for SBP < 90, use Dopamine if not effective.

## 2015-01-30 NOTE — Consult Note (Signed)
NEURO HOSPITALIST CONSULT NOTE    Reason for Consult: HA after CEA  HPI:                                                                                                                                          [name] is an 74 y.o. male presenting to [facility] for left CEA for 80-99 % occlusion.  Post procedure he noted a severe HA located ipsilateral to the side of his surgery. He describes it as constant and 10/10.  He has received 4 mg Dexamethasone and 5 mg oxycodone with mild relief. Neurology was consulted to evaluate.  He denies associated vertigo, nausea, vomiting, slurred speech, focal weakness or numbness, language or vision impairment.  Past Medical History  Diagnosis Date  . Hyperlipidemia   . Hypertension   . GERD (gastroesophageal reflux disease)   . Esophageal stricture     a. History of dilitation - Long-standing history of esophageal reflux   . 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 1999: LIMA to LAD, sequential SVG to diagonal 2 and diagonal 3, sequential SVG to PDA, acute marginal branch, and PL branch. b. Repeat CABG 2015 with SVG to D1, SVG to PDA, and SVG to OM.   Marland Kitchen CAD (coronary artery disease), native coronary artery 09/24/2013    a. CABG x 6 1999. b. Interim PCIs: Angioplasty SVG-PDA 2006.Taxus DES to native LCx in 2007.DES to SVG-PDA 2007, restented 2009.NSTEMI with PCI to SVG- PDA complicated by no-reflow/inferior infarction in 2012. c. Redo CABG 2015 with SVG-PD, SVG-D1, SVG-OM1.  . Chronic combined systolic and diastolic CHF (congestive heart failure)     a. Prior EF 45-50% in 08/2014. b. Improved EF >55% in 09/2014 at [facility].  . Type II diabetes mellitus   . Anemia     a. + FOBT 08/2014.  Marland Kitchen History of hiatal hernia   . Daily headache   . Arthritis   . Chronic lower back pain   . CKD (chronic kidney disease) stage 3, GFR 30-59 ml/min   . Skin cancer   . Polymyalgia rheumatica   . SVT (supraventricular tachycardia)   . Atrial fibrillation    . MI (myocardial infarction) 1999-02/2014    total of 7 MI per patient  . Carotid artery occlusion     a. Duplex 12/2013: mild plaque bilat, 40-59% BICA. F/u due 12/2014.  Marland Kitchen Atherosclerosis of native arteries of the extremities with intermittent claudication     a. L external iliac stent 2001, bilateral SFA occlusions documented 2001.  Marland Kitchen OSA (obstructive sleep apnea)     "don't wear mask"   . Hypotonic bladder     Requiring in and out catheterization performed by the patient at home - occasional blood tinged urine (self-cath's daily for the  last 2 years) and hematuria is chronic for him, followed by urology.  Marland Kitchen COPD (chronic obstructive pulmonary disease)     pt states he doesn't have    Past Surgical History  Procedure Laterality Date  . Coronary stent placement      "I've had 8 put in; I presently have none" (08/26/2014)  . Cardiac catheterization    . Intraoperative transesophageal echocardiogram N/A 03/01/2014    Procedure: INTRAOPERATIVE TRANSESOPHAGEAL ECHOCARDIOGRAM;  Surgeon: Gaye Pollack, MD;  Location: [facility] OR;  Service: Open Heart Surgery;  Laterality: N/A;  . Anterior cervical decomp/discectomy fusion    . Ankle fracture surgery Right ~ 1977    "10 plates and 7 screws placed in it"  . Tonsillectomy    . Fracture surgery    . Cataract extraction w/ intraocular lens  implant, bilateral Bilateral 2000's  . Mohs surgery  X 2    "nose; nose"  . Skin cancer excision      "all over my head; ears; back  . Esophagogastroduodenoscopy (egd) with esophageal dilation  X 1  . Back surgery      neck surgery  . Coronary artery bypass graft  1999  . Coronary artery bypass graft N/A 03/01/2014    Procedure: REDO CORONARY ARTERY BYPASS GRAFTING TIMES THREE, USING RIGHT GREATER SAPHENOUS VEIN VIA ENDOVEIN HARVEST.;  Surgeon: Gaye Pollack, MD;  Location: [facility] OR;  Service: Open Heart Surgery;  Laterality: N/A;  . Left heart catheterization with coronary/graft angiogram  02/26/2014     Procedure: LEFT HEART CATHETERIZATION WITH Beatrix Fetters;  Surgeon: Sinclair Grooms, MD;  Location: [facility] CATH LAB;  Service: Cardiovascular;;  . Left heart catheterization with coronary/graft angiogram N/A 07/23/2014    Procedure: LEFT HEART CATHETERIZATION WITH Beatrix Fetters;  Surgeon: Sinclair Grooms, MD;  Location: [facility] CATH LAB;  Service: Cardiovascular;  Laterality: N/A;  . Left heart catheterization with coronary/graft angiogram N/A 08/30/2014    Procedure: LEFT HEART CATHETERIZATION WITH Beatrix Fetters;  Surgeon: Leonie Man, MD;  Location: [facility] CATH LAB;  Service: Cardiovascular;  Laterality: N/A;  . Abdominal aortagram N/A 11/27/2014    Procedure: ABDOMINAL Maxcine Ham;  Surgeon: Wellington Hampshire, MD;  Location: [facility] CATH LAB;  Service: Cardiovascular;  Laterality: N/A;  . Multiple tooth extractions    . Colonoscopy      Family History  Problem Relation Age of Onset  . CAD Mother   . Heart disease Mother   . Hypertension Mother   . Cirrhosis Brother   . Heart disease Father   . Hyperlipidemia Father   . Hypertension Father   . Heart attack Father      Social History:  reports that he quit smoking about 11 months ago. His smoking use included Cigarettes. He has a 50 pack-year smoking history. He has never used smokeless tobacco. He reports that he does not drink alcohol or use illicit drugs.  Family history: no brain tumors, epilepsy, or brain aneurysms.  No Known Allergies  MEDICATIONS:  Prior to Admission:  Prescriptions prior to admission  Medication Sig Dispense Refill Last Dose  . acetaminophen (TYLENOL) 325 MG tablet Take 650 mg by mouth every 6 (six) hours as needed for moderate pain.   01/29/2015 at Unknown time  . amLODipine (NORVASC) 10 MG tablet Take 10 mg by mouth every morning.    01/30/2015 at Unknown time  . aspirin EC  81 MG EC tablet Take 1 tablet (81 mg total) by mouth daily. 1 tablet 0 01/29/2015 at Unknown time  . atorvastatin (LIPITOR) 20 MG tablet Take 20 mg by mouth daily at 6 PM.    01/29/2015 at Unknown time  . cholecalciferol (VITAMIN D) 1000 UNITS tablet Take 1,000 Units by mouth daily.   01/29/2015 at Unknown time  . clopidogrel (PLAVIX) 75 MG tablet Take 1 tablet (75 mg total) by mouth daily. 90 tablet 3 01/29/2015 at Unknown time  . Cranberry 500 MG CAPS Take 1 capsule by mouth at bedtime.   01/29/2015 at Unknown time  . ferrous sulfate 325 (65 FE) MG tablet Take 325 mg by mouth every evening.    01/29/2015 at Unknown time  . glipiZIDE (GLUCOTROL) 10 MG tablet Take 1 tablet (10 mg total) by mouth 2 (two) times daily before a meal. 60 tablet 0 01/29/2015 at Unknown time  . hydrALAZINE (APRESOLINE) 50 MG tablet Take 50 mg by mouth 3 (three) times daily.   01/29/2015 at Unknown time  . Insulin Glargine (LANTUS) 100 UNIT/ML Solostar Pen Inject 10 Units into the skin every morning. (Patient taking differently: Inject 10 Units into the skin daily. ) 15 mL 1 01/29/2015 at Unknown time  . Insulin Pen Needle (FIFTY50 PEN NEEDLES) 31G X 5 MM MISC As directed 30 each 1 Taking  . irbesartan (AVAPRO) 150 MG tablet Take 150 mg by mouth daily.    01/29/2015 at Unknown time  . isosorbide mononitrate (IMDUR) 120 MG 24 hr tablet TAKE 1 TABLET BY MOUTH EVERY DAY 30 tablet 6 01/30/2015 at Unknown time  . metoprolol (LOPRESSOR) 50 MG tablet Take 1 tablet (50 mg total) by mouth 2 (two) times daily. 60 tablet 11 01/30/2015 at 0400  . Multiple Vitamins-Minerals (MULTIVITAMIN WITH MINERALS) tablet Take 1 tablet by mouth daily.   01/29/2015 at Unknown time  . pantoprazole (PROTONIX) 40 MG tablet Take 1 tablet (40 mg total) by mouth daily. 90 tablet 3 01/29/2015 at Unknown time  . torsemide (DEMADEX) 20 MG tablet Take 2 tablets by mouth once a day   01/29/2015 at Unknown time  . nitroGLYCERIN (NITROSTAT) 0.4 MG SL tablet Place 0.4 mg under  the tongue every 5 (five) minutes as needed for chest pain.   Unknown at Unknown time   Scheduled: . Chlorhexidine Gluconate Cloth  6 each Topical Once   And  . Chlorhexidine Gluconate Cloth  6 each Topical Once  . dexamethasone  4 mg Oral Q12H  . DOPamine      . oxyCODONE         ROS:  History obtained from the patient  General ROS: negative for - chills, fatigue, fever, night sweats, weight gain or weight loss Psychological ROS: negative for - behavioral disorder, hallucinations, memory difficulties, mood swings or suicidal ideation Ophthalmic ROS: negative for - blurry vision, double vision, eye pain or loss of vision ENT ROS: negative for - epistaxis, nasal discharge, oral lesions, sore throat, tinnitus or vertigo Allergy and Immunology ROS: negative for - hives or itchy/watery eyes Hematological and Lymphatic ROS: negative for - bleeding problems, bruising or swollen lymph nodes Endocrine ROS: negative for - galactorrhea, hair pattern changes, polydipsia/polyuria or temperature intolerance Respiratory ROS: negative for - cough, hemoptysis, shortness of breath or wheezing Cardiovascular ROS: negative for - chest pain, dyspnea on exertion, edema or irregular heartbeat Gastrointestinal ROS: negative for - abdominal pain, diarrhea, hematemesis, nausea/vomiting or stool incontinence Genito-Urinary ROS: negative for - dysuria, hematuria, incontinence or urinary frequency/urgency Musculoskeletal ROS: negative for - joint swelling or muscular weakness Neurological ROS: as noted in HPI Dermatological ROS: negative for rash and skin lesion changes  Physical  Examination:                                                                                                      Blood pressure 110/38, pulse 52, temperature 98 F (36.7 C), temperature source Oral,  resp. rate 10, weight 72.576 kg (160 lb), SpO2 99 %. HEENT-  Normocephalic, no lesions, without obvious abnormality.  Normal external eye and conjunctiva.  Normal TM's bilaterally.  Normal auditory canals and external ears. Normal external nose, mucus membranes and septum.  Normal pharynx. Cardiovascular- regularly irregular rhythm, pulses palpable throughout   Lungs- chest clear, no wheezing, rales, normal symmetric air entry Abdomen- normal findings: bowel sounds normal Extremities- less then 2 second capillary refill Lymph-no adenopathy palpable Musculoskeletal-no joint tenderness, deformity or swelling Skin-warm and dry, no hyperpigmentation, vitiligo, or suspicious lesions  Neurological Examination Mental Status: Alert, oriented, thought content appropriate.  Speech fluent without evidence of aphasia.  Able to follow 3 step commands without difficulty. Cranial Nerves: II: Discs flat bilaterally; Visual fields grossly normal, pupils equal, round, reactive to light and accommodation III,IV, VI: ptosis not present, extra-ocular motions intact bilaterally V,VII: smile symmetric, facial light touch sensation normal bilaterally VIII: hearing normal bilaterally IX,X: uvula rises symmetrically XI: bilateral shoulder shrug XII: midline tongue extension Motor: Right : Upper extremity   5/5    Left:     Upper extremity   5/5  Lower extremity   5/5     Lower extremity   5/5 Tone and bulk:normal tone throughout; no atrophy noted Sensory: Pinprick and light touch intact throughout, bilaterally Deep Tendon Reflexes: 2+ and symmetric throughout Plantars: Right: downgoing   Left: downgoing Cerebellar: normal finger-to-nose,  and normal heel-to-shin test Gait: not tested due to safety   Lab Results: Basic Metabolic Panel: No results for input(s): NA, K, CL, CO2, GLUCOSE, BUN, CREATININE, CALCIUM, MG, PHOS in the last 168 hours.  Liver Function Tests: No results for input(s): AST, ALT,  ALKPHOS, BILITOT, PROT, ALBUMIN in the last 168 hours. No results  for input(s): LIPASE, AMYLASE in the last 168 hours. No results for input(s): AMMONIA in the last 168 hours.  CBC: No results for input(s): WBC, NEUTROABS, HGB, HCT, MCV, PLT in the last 168 hours.  Cardiac Enzymes: No results for input(s): CKTOTAL, CKMB, CKMBINDEX, TROPONINI in the last 168 hours.  Lipid Panel: No results for input(s): CHOL, TRIG, HDL, CHOLHDL, VLDL, LDLCALC in the last 168 hours.  CBG:  Recent Labs Lab 01/30/15 0553 01/30/15 1033  GLUCAP 173* 172*    Microbiology: Results for orders placed or performed during the hospital encounter of 01/22/15  Surgical pcr screen     Status: None   Collection Time: 01/22/15 11:46 AM  Result Value Ref Range Status   MRSA, PCR NEGATIVE NEGATIVE Final   Staphylococcus aureus NEGATIVE NEGATIVE Final    Comment:        The Xpert SA Assay (FDA approved for NASAL specimens in patients over 58 years of age), is one component of a comprehensive surveillance program.  Test performance has been validated by [facility] for patients greater than or equal to 6 year old. It is not intended to diagnose infection nor to guide or monitor treatment.     Coagulation Studies: No results for input(s): LABPROT, INR in the last 72 hours.  Imaging: No results found.   Assessment and plan per attending neurologist  Etta Quill PA-C [facility] [phone]  01/30/2015, 12:32 PM   Assessment/Plan:  74 YO male S/P left CA now presenting with significant HA on the ipsilateral side of surgery without other associated neurological symptoms. Exam is non focal.  Likely HA in the context of hyperperfusion. However would like to obtain CT head to confirm no intracranial bleed.   Recommend: 1) Agree with BP with SBP <150 (currently well controlled 140/40).  2) pain control per primary team. Will make further recommendations if CT brain  abnormal.   Patient seen and examined together with physician assistant and I concur with the assessment and plan.  Dorian Pod, MD

## 2015-01-30 NOTE — H&P (View-Only) (Signed)
[facility] and [facility] Specialist of [facility]  Patient name: [name] MRN: [id number] DOB: [birth year] Sex: male  REASON FOR CONSULT: greater than 80% left carotid stenosis. Referred by Dr. Daneen Schick  HPI: [name] is a 74 y.o. male who has been followed with carotid disease by cardiology. The left carotid stenosis progressed to greater than 80% and for this reason he was sent for carotid evaluation.  I have reviewed the records from [facility] heart care. The patient is undergone previous coronary revascularization in 1999 and has had multiple PCI since then. He had a redo CABG in 2015. As a history of congestive heart failure with an ejection fraction of 45-50% based on an echo done in November 2015. He also has a history of COPD, type 2 diabetes, and obstructive sleep apnea. In addition he has stage III chronic kidney disease and polymyalgia rheumatica.  His most recent heart attack was in November 2015 He is also been admitted with congestive heart failure. He states that he  Had some slight chest pain during cardiac rehabilitation just this last Friday but his EKG was reportedly okay.  He is on aspirin, a statin, and Plavix.  Past Medical History  Diagnosis Date  . Carotid artery occlusion     a. Duplex 12/2013: mild plaque bilat, 40-59% BICA. F/u due 12/2014.  Marland Kitchen Hyperlipidemia   . Atherosclerosis of native arteries of the extremities with intermittent claudication     a. L external iliac stent 2001, bilateral SFA occlusions documented 2001.  Marland Kitchen Hypertension   . GERD (gastroesophageal reflux disease)   . Esophageal stricture     a. History of dilitation - Long-standing history of esophageal reflux   . 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 1999: LIMA to LAD, sequential SVG to diagonal 2 and diagonal 3, sequential SVG to PDA, acute marginal branch, and PL branch. b. Repeat CABG 2015 with SVG to D1, SVG to PDA, and SVG to OM.   Marland Kitchen CAD (coronary artery disease), native coronary artery  09/24/2013    a. CABG x 6 1999. b. Interim PCIs: Angioplasty SVG-PDA 2006.Taxus DES to native LCx in 2007.DES to SVG-PDA 2007, restented 2009.NSTEMI with PCI to SVG- PDA complicated by no-reflow/inferior infarction in 2012. c. Redo CABG 2015 with SVG-PD, SVG-D1, SVG-OM1.  . Chronic combined systolic and diastolic CHF (congestive heart failure)     a. Prior EF 45-50% in 08/2014. b. Improved EF >55% in 09/2014 at [facility].  . MI (myocardial infarction) 1999-02/2014  . OSA (obstructive sleep apnea)     "don't wear mask" (08/26/2014)  . Type II diabetes mellitus   . Anemia     a. + FOBT 08/2014.  Marland Kitchen History of hiatal hernia   . Daily headache   . Arthritis   . Chronic lower back pain   . CKD (chronic kidney disease) stage 3, GFR 30-59 ml/min   . Skin cancer   . Hypotonic bladder     Requiring in and out catheterization performed by the patient at home - occasional blood tinged urine (self-cath's daily for the last 2 years) and hematuria is chronic for him, followed by urology.  . Polymyalgia rheumatica   . SVT (supraventricular tachycardia)   . COPD (chronic obstructive pulmonary disease)   . Atrial fibrillation    Family History  Problem Relation Age of Onset  . CAD Mother   . Heart disease Mother   . Hypertension Mother   . Cirrhosis Brother   . Heart disease  Father   . Hyperlipidemia Father   . Hypertension Father   . Heart attack Father    SOCIAL HISTORY: History  Substance Use Topics  . Smoking status: Former Smoker -- 1.00 packs/day for 50 years    Types: Cigarettes    Quit date: 02/25/2014  . Smokeless tobacco: Never Used  . Alcohol Use: No   No Known Allergies Current Outpatient Prescriptions  Medication Sig Dispense Refill  . acetaminophen (TYLENOL) 325 MG tablet Take 650 mg by mouth every 6 (six) hours as needed for moderate pain.    Marland Kitchen amLODipine (NORVASC) 10 MG tablet Take 10 mg by mouth daily.    Marland Kitchen aspirin EC 81 MG EC tablet Take 1 tablet (81 mg total) by mouth  daily. 1 tablet 0  . atorvastatin (LIPITOR) 20 MG tablet Take 20 mg by mouth daily at 6 PM.     . cholecalciferol (VITAMIN D) 1000 UNITS tablet Take 1,000 Units by mouth daily.    . clopidogrel (PLAVIX) 75 MG tablet Take 1 tablet (75 mg total) by mouth daily. 90 tablet 3  . ferrous sulfate 325 (65 FE) MG tablet Take 325 mg by mouth daily with breakfast.    . glipiZIDE (GLUCOTROL) 10 MG tablet Take 1 tablet (10 mg total) by mouth 2 (two) times daily before a meal. 60 tablet 0  . hydrALAZINE (APRESOLINE) 50 MG tablet Take 50 mg by mouth 3 (three) times daily.    . Insulin Glargine (LANTUS) 100 UNIT/ML Solostar Pen Inject 10 Units into the skin every morning. 15 mL 1  . Insulin Pen Needle (FIFTY50 PEN NEEDLES) 31G X 5 MM MISC As directed 30 each 1  . irbesartan (AVAPRO) 300 MG tablet Take 150 mg by mouth daily. Taking one half tablet by mouth daily    . isosorbide mononitrate (IMDUR) 120 MG 24 hr tablet Take 1 tablet (120 mg total) by mouth daily. 30 tablet 5  . metoprolol (LOPRESSOR) 50 MG tablet Take 1 tablet (50 mg total) by mouth 2 (two) times daily. 60 tablet 11  . Multiple Vitamins-Minerals (MULTIVITAMIN WITH MINERALS) tablet Take 1 tablet by mouth daily.    . nitroGLYCERIN (NITROSTAT) 0.4 MG SL tablet Place 0.4 mg under the tongue every 5 (five) minutes as needed for chest pain.    . pantoprazole (PROTONIX) 40 MG tablet Take 1 tablet (40 mg total) by mouth daily. 90 tablet 3  . torsemide (DEMADEX) 20 MG tablet Take 2 tablets by mouth once a day    . levofloxacin (LEVAQUIN) 500 MG tablet Take 1 tablet by mouth daily.  0   No current facility-administered medications for this visit.   REVIEW OF SYSTEMS: Valu.Nieves ] denotes positive finding; [  ] denotes negative finding  CARDIOVASCULAR:  [ ]  chest pain   [ ]  chest pressure   [ ]  palpitations   [ ]  orthopnea   [ ]  dyspnea on exertion   Valu.Nieves ] claudication   Valu.Nieves ] rest pain   [ ]  DVT   [ ]  phlebitis PULMONARY:   [ ]  productive cough   [ ]  asthma   [ ]   wheezing NEUROLOGIC:   [ ]  weakness  [ ]  paresthesias  [ ]  aphasia  [ ]  amaurosis  [ ]  dizziness HEMATOLOGIC:   [ ]  bleeding problems   [ ]  clotting disorders MUSCULOSKELETAL:  [ ]  joint pain   [ ]  joint swelling [ ]  leg swelling GASTROINTESTINAL: [ ]   blood in stool  [ ]   hematemesis GENITOURINARY:  [ ]   dysuria  [ ]   hematuria PSYCHIATRIC:  [ ]  history of major depression INTEGUMENTARY:  [ ]  rashes  [ ]  ulcers CONSTITUTIONAL:  [ ]  fever   [ ]  chills  PHYSICAL EXAM: Filed Vitals:   01/01/15 1452 01/01/15 1459  BP: 152/48 138/37  Pulse: 48   Height: 5\' 6"  (1.676 m)   Weight: 163 lb (73.936 kg)   SpO2: 98%    Body mass index is 26.32 kg/(m^2). GENERAL: The patient is a well-nourished male, in no acute distress. The vital signs are documented above. CARDIOVASCULAR: There is a regular rate and rhythm. He has bilateral carotid bruits. I cannot palpate femoral pulses. I cannot palpate pedal pulses. PULMONARY: There is good air exchange bilaterally without wheezing or rales. ABDOMEN: Soft and non-tender with normal pitched bowel sounds.  MUSCULOSKELETAL: There are no major deformities or cyanosis. NEUROLOGIC: No focal weakness or paresthesias are detected. SKIN: There are no ulcers or rashes noted. PSYCHIATRIC: The patient has a normal affect.  DATA:  I have reviewed his carotid duplex scan that was done on 12/24/2014 on [employer]. This showed a 40-59% right carotid stenosis with a greater than 80% left carotid stenosis. The plaque extended up into the mid internal carotid artery on the left.  I have independently interpreted the limited carotid duplex scan done in our office today of the left carotid system. There is a greater than 80% left carotid stenosis. This extends up proximally 2.3 cm into the internal carotid artery above the bifurcation.  I reviewed his aortogram from February 10 which shows diffuse aortoiliac occlusive disease.  MEDICAL ISSUES:  GREATER THAN 80% LEFT  CAROTID STENOSIS: This patient has a greater than 80% asymptomatic left carotid stenosis. The stenosis appears to extend fairly high up the internal carotid artery. He has a significant cardiac history. He had a myocardial infarction November 2015 and is also been admitted with congestive heart failure since then. He had chest pain and cardiac rehabilitation last week. I discussed the case with Dr. Fletcher Anon over the phone today and we both agree that it would be safest to obtain preoperative cardiac workup prior to considering left carotid endarterectomy. His primary cardiologist is Dr. Pernell Dupre and we will arrange for an appointment with Dr. Tamala Julian. If he is felt to be very high-risk for surgery he could potentially be considered for a carotid stent. If he is considered a candidate for surgery ideally we would like to stop his Plavix 1 week preoperatively. If however we could not safely stop his Plavix we could consider proceeding with carotid endarterectomy with a slightly higher risk of bleeding. I'll make further recommendations pending his workup by Dr. Tamala Julian. I have reviewed the indications for carotid endarterectomy, that is to lower the risk of future stroke. I have also reviewed the potential complications of surgery, including but not limited to: bleeding, stroke (perioperative risk 1-2%), MI, nerve injury of other unpredictable medical problems.   [place] [facility] [place] [facility]: [phone]

## 2015-01-30 NOTE — Anesthesia Postprocedure Evaluation (Signed)
Anesthesia Post Note  Patient: [name]  Procedure(s) Performed: Procedure(s) (LRB): ENDARTERECTOMY CAROTID WITH PRIMARY CLOSURE OF ARTERY (Left)  Anesthesia type: General  Patient location: PACU  Post pain: Pain level controlled  Post assessment: Post-op Vital signs reviewed  Last Vitals: BP 132/37 mmHg  Pulse 58  Temp(Src) 36.6 C (Oral)  Resp 14  Ht 5\' 6"  (1.676 m)  Wt 165 lb 5.5 oz (75 kg)  BMI 26.70 kg/m2  SpO2 99%  Post vital signs: Reviewed  Level of consciousness: sedated  PACU course: Some headache. Scot Dock worried about reperfusion syndrome. Sent for CT head and had neurology consult.   Complications: No apparent anesthesia complications

## 2015-01-30 NOTE — Op Note (Signed)
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 Scot Dock, MD, FACS  ASSIST: Annamarie Major M.D., Leontine Locket, [place]  ANESTHESIA: Gen.   EBL: 100 cc  INDICATIONS: [name] is a 74 y.o. male he presented with a greater than 80% left carotid stenosis. Left carotid endarterectomy was recommended in order to lower his risk of future stroke.  FINDINGS: the patient had a very extensive plaque which extended very low into the common carotid artery down to the level of the clavicle. Distally the plaque extended all the way up to the base of the skull. I was able to clamp just below the base of the skull. I was approximately 3 cm above the hypoglossal nerve which had to be fully mobilized. The artery was large and therefore this was closed primarily.  TECHNIQUE: The patient was taken to the operating room and received a general anesthetic. Monitoring lines had been placed by anesthesia. The left neck was prepped and draped in the usual sterile fashion. An incision was made along the anterior border of the sternocleidomastoid and dissection carried down to the common carotid artery. There was extensive plaque in the common carotid artery. Therefore had to extend the incision proximally and dissecting very low on the common carotid artery before it was finally able to get an area where I could clamp. This was quite low. I then divided the facial vein. The bifurcation was high and the plaque extended very high above the bifurcation I had to fully mobilize the hypoglossal nerve so that I could dissect above it in order to expose the internal carotid artery. Retracted on the digastric muscle. I had to divide the occipital artery. Again the section continued up to the base  of the skull. It was only at this level that there was a soft area to clamping the artery. Of note the artery was large however and I did not think I would need to patch the artery given its size. The patient was heparinized.  Clamp was placed on the internal carotid artery just below the base of the skull. Next the common carotid artery and external carotid arteries were controlled. A longitudinal arteriotomy was made in the common carotid artery. This was extended through the plaque at the bifurcation. However the plaque extended very high. There was poor back bleeding initially but I think this was because the plaque was preventing backbleeding as the plaque extended very high. I tended to place 12 shunt however because the tortuosity intracranially this would not go easily [place] attention. Therefore elected not to shunt. Once I got above the plaque there was good backbleeding. An endarterectomy plane was established proximally and the plaque sharply divided. Eversion endarterectomy was performed of the external carotid artery. Distally it was nice taper in the plaque and no tacking sutures were required. The artery was irrigated with copious amounts of heparin and dextran and all loose debris was removed. I closed the artery primarily with 2 running 6-0 Prolene sutures. Prior to completing the closure the artery was backbled and flushed and the anastomosis completed. Flow was reestablished first to the external carotid artery and into the internal carotid artery. At the completion was an excellent Doppler signal distal to the closure with good diastolic flow.  The heparin was partially  reversed with protamine. Hemostasis was obtained in the wound. The wounds closed the deep layer of 3-0 Vicryl. The platysma was closed with running 3-0 Vicryl. The skin was closed with a 40 septic or stitch. Dermabond was applied. The patient tolerated the procedure well and awoke neurologically intact. All needle and sponge  counts were correct.  Deitra Mayo, MD, FACS [facility]  DATE OF DICTATION:   01/30/2015

## 2015-01-30 NOTE — Progress Notes (Signed)
Ret from CT

## 2015-01-30 NOTE — Progress Notes (Signed)
Pt feels need to self cath----  order to place foley and leave in per Dr Scot Dock. Wife at bedside and fully updated; also aware we are waiting on a 3S bed. # 16 FR foley placed without difficulty, draining clear yellow uo, pt tol. procedure well & says he feels better. Dr Scot Dock at bedside & aware pt cont. c/o of "bad headache". He is calling a neurology consult. Also aware cont. SBP 86-92  After NS bolus X2.  Order to start Dopamine drip. Will cont. to monitor closely.

## 2015-01-31 ENCOUNTER — Encounter (HOSPITAL_COMMUNITY): Payer: Medicare Other

## 2015-01-31 LAB — BASIC METABOLIC PANEL
Anion gap: 9 (ref 5–15)
BUN: 50 mg/dL — ABNORMAL HIGH (ref 6–23)
CO2: 25 mmol/L (ref 19–32)
Calcium: 8.6 mg/dL (ref 8.4–10.5)
Chloride: 104 mmol/L (ref 96–112)
Creatinine, Ser: 2.19 mg/dL — ABNORMAL HIGH (ref 0.50–1.35)
GFR calc Af Amer: 33 mL/min — ABNORMAL LOW (ref >90)
GFR, EST NON AFRICAN AMERICAN: 28 mL/min — AB (ref >90)
Glucose, Bld: 152 mg/dL — ABNORMAL HIGH (ref 70–99)
Potassium: 5.5 mmol/L — ABNORMAL HIGH (ref 3.5–5.1)
SODIUM: 138 mmol/L (ref 135–145)

## 2015-01-31 LAB — CBC
HCT: 30.9 % — ABNORMAL LOW (ref 39.0–52.0)
Hemoglobin: 10 g/dL — ABNORMAL LOW (ref 13.0–17.0)
MCH: 26 pg (ref 26.0–34.0)
MCHC: 32.4 g/dL (ref 30.0–36.0)
MCV: 80.5 fL (ref 78.0–100.0)
PLATELETS: 170 10*3/uL (ref 150–400)
RBC: 3.84 MIL/uL — ABNORMAL LOW (ref 4.22–5.81)
RDW: 19.3 % — AB (ref 11.5–15.5)
WBC: 11.8 10*3/uL — ABNORMAL HIGH (ref 4.0–10.5)

## 2015-01-31 LAB — GLUCOSE, CAPILLARY: GLUCOSE-CAPILLARY: 167 mg/dL — AB (ref 70–99)

## 2015-01-31 NOTE — Progress Notes (Signed)
  Progress Note    01/31/2015 7:22 AM 1 Day Post-Op  Subjective:  Only slight headache.  He does have a sore throat  Afebrile HR 50's-80's  710'G-269'S systolic 85% RA  Filed Vitals:   01/31/15 0317  BP:   Pulse:   Temp: 98.7 F (37.1 C)  Resp:      Physical Exam: Neuro:  In tact, swallowing okay Incision:  C/d/i  CBC    Component Value Date/Time   WBC 11.8* 01/31/2015 0403   RBC 3.84* 01/31/2015 0403   HGB 10.0* 01/31/2015 0403   HCT 30.9* 01/31/2015 0403   PLT 170 01/31/2015 0403   MCV 80.5 01/31/2015 0403   MCH 26.0 01/31/2015 0403   MCHC 32.4 01/31/2015 0403   RDW 19.3* 01/31/2015 0403   LYMPHSABS 1.0 09/06/2014 1058   MONOABS 0.5 09/06/2014 1058   EOSABS 0.1 09/06/2014 1058   BASOSABS 0.0 09/06/2014 1058    BMET    Component Value Date/Time   NA 138 01/31/2015 0403   NA 139 10/14/2014 0951   K 5.5* 01/31/2015 0403   CL 104 01/31/2015 0403   CO2 25 01/31/2015 0403   GLUCOSE 152* 01/31/2015 0403   GLUCOSE 112* 10/14/2014 0951   BUN 50* 01/31/2015 0403   BUN 45* 10/14/2014 0951   CREATININE 2.19* 01/31/2015 0403   CALCIUM 8.6 01/31/2015 0403   GFRNONAA 28* 01/31/2015 0403   GFRAA 33* 01/31/2015 0403     Intake/Output Summary (Last 24 hours) at 01/31/15 0722 Last data filed at 01/31/15 0600  Gross per 24 hour  Intake   3363 ml  Output   1960 ml  Net   1403 ml      Assessment/Plan:  This is a 74 y.o. male who is s/p left CEA 1 Day Post-Op  -pt is doing well this am. -pt neuro exam is in tact-HA is much improved from yesterday.  He does have a BP cuff at home and will continue to monitor his BP -Drain with only 40cc out total-will discontinue -pt has ambulated -pt has voided -His Creatinine is 2.19, which is down from 2.26 pre-operatively.  He is on an ARB and his nephrologist is aware of this.  He does have an appointment with Dr. Posey Pronto on the 20th at which time, his meds can be re-evaluated. -his K+ is 5.5 this am, which is only  slightly elevated from his baseline.  He is on torsemide and will receive a dose of this today.  He is not receiving any potassium supplement. -return to see Dr. Scot Dock in 2 weeks.   Leontine Locket, PA-C [facility] [phone]

## 2015-01-31 NOTE — Discharge Summary (Signed)
Discharge Summary     [name] Feb 19, 1941 74 y.o. male  [id number]  Admission Date: 01/30/2015  Discharge Date: 01/31/15  Physician: Angelia Mould, MD  Admission Diagnosis: Left carotid artery stenosis I65.22   HPI:   This is a 74 y.o. male who has been followed with carotid disease by cardiology. The left carotid stenosis progressed to greater than 80% and for this reason he was sent for carotid evaluation.  I have reviewed the records from [facility] heart care. The patient is undergone previous coronary revascularization in 1999 and has had multiple PCI since then. He had a redo CABG in 2015. As a history of congestive heart failure with an ejection fraction of 45-50% based on an echo done in November 2015. He also has a history of COPD, type 2 diabetes, and obstructive sleep apnea. In addition he has stage III chronic kidney disease and polymyalgia rheumatica.  His most recent heart attack was in November 2015 He is also been admitted with congestive heart failure. He states that he Had some slight chest pain during cardiac rehabilitation just this last Friday but his EKG was reportedly okay.  He is on aspirin, a statin, and Plavix.  Hospital Course:  The patient was admitted to the hospital and taken to the operating room on 01/30/2015 and underwent left carotid endarterectomy.  Findings intraoperatively are as follows:  the patient had a very extensive plaque which extended very low into the common carotid artery down to the level of the clavicle. Distally the plaque extended all the way up to the base of the skull. I was able to clamp just below the base of the skull. I was approximately 3 cm above the hypoglossal nerve which had to be fully mobilized. The artery was large and therefore this was closed primarily.    The pt tolerated the procedure well and was transported to the PACU in good condition.  He did have a severe headache in the PACU.  This was thought  to be a reperfusion HA, however neurology was consulted.  He was given dexamethasone.    Head CT with the following results: IMPRESSION: 1. No noncontrast CT changes identified to suggest reperfusion injury following left carotid endarterectomy. No acute intracranial hemorrhage identified. 2. Changes in the white and deep gray matter compatible with chronic small vessel ischemia.   By POD 1, the pt neuro status is in tact.  He continues to have a slight headache, but much improved from post op.  He does have a sore throat, which could be partly from the endotracheal tube and also could be due to the high dissection.    His drain is removed.  His creatinine is 2.1, which is down from pre op of 2.3.  He is on Avapro.  He states that his renal MD is aware of these medications and has an appt with him next week.  His K+ is 5.5, which is slightly higher than his baseline of 5.2.  He will receive his Demadex today.  The remainder of the hospital course consisted of increasing mobilization and increasing intake of solids without difficulty.    Recent Labs  01/31/15 0403  NA 138  K 5.5*  CL 104  CO2 25  GLUCOSE 152*  BUN 50*  CALCIUM 8.6    Recent Labs  01/31/15 0403  WBC 11.8*  HGB 10.0*  HCT 30.9*  PLT 170   No results for input(s): INR in the last 72 hours.   Discharge  Instructions    CAROTID Sugery: Call MD for difficulty swallowing or speaking; weakness in arms or legs that is a new symtom; severe headache.  If you have increased swelling in the neck and/or  are having difficulty breathing, CALL [phone]    Complete by:  As directed      Call MD for:  redness, tenderness, or signs of infection (pain, swelling, bleeding, redness, odor or green/yellow discharge around incision site)    Complete by:  As directed      Call MD for:  severe or increased pain, loss or decreased feeling  in affected limb(s)    Complete by:  As directed      Call MD for:  temperature >100.5     Complete by:  As directed      Discharge wound care:    Complete by:  As directed   Shower daily with soap and water starting 02/01/15     Driving Restrictions    Complete by:  As directed   No driving for 2 weeks     Lifting restrictions    Complete by:  As directed   No lifting for 2 weeks     Resume previous diet    Complete by:  As directed            Discharge Diagnosis:  Left carotid artery stenosis I65.22  Secondary Diagnosis: Patient Active Problem List   Diagnosis Date Noted  . Left carotid artery stenosis 01/30/2015  . PAD (peripheral artery disease) 10/25/2014  . SVT (supraventricular tachycardia)   . NSTEMI (non-ST elevated myocardial infarction) 08/30/2014  . Polymyalgia rheumatica 08/30/2014  . Anemia 08/30/2014  . Hyperglycemia   . Chronic combined systolic and diastolic congestive heart failure 08/29/2014  . HTN (hypertension) 08/27/2014  . Chronic diastolic heart failure 52/84/1324  . CKD (chronic kidney disease) stage 3, GFR 30-59 ml/min 02/26/2014  . Hx of CABG 09/24/2013  . CAD (coronary artery disease), native coronary artery 09/24/2013  . COPD (chronic obstructive pulmonary disease) 09/24/2013  . Type 2 diabetes mellitus with vascular disease 09/24/2013  . 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 Esophageal stricture 09/24/2013  . OSA (obstructive sleep apnea)   . Hypertensive heart disease   . Tobacco use disorder 07/30/2013  . Carotid artery disease   . Hyperlipidemia   . Atherosclerosis of native arteries of the extremities with intermittent claudication    Past Medical History  Diagnosis Date  . Hyperlipidemia   . Hypertension   . GERD (gastroesophageal reflux disease)   . Esophageal stricture     a. History of dilitation - Long-standing history of esophageal reflux   . 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 1999: LIMA to LAD, sequential SVG to diagonal 2 and diagonal 3, sequential SVG to PDA, acute marginal branch, and PL branch. b. Repeat CABG 2015 with  SVG to D1, SVG to PDA, and SVG to OM.   Marland Kitchen CAD (coronary artery disease), native coronary artery 09/24/2013    a. CABG x 6 1999. b. Interim PCIs: Angioplasty SVG-PDA 2006.Taxus DES to native LCx in 2007.DES to SVG-PDA 2007, restented 2009.NSTEMI with PCI to SVG- PDA complicated by no-reflow/inferior infarction in 2012. c. Redo CABG 2015 with SVG-PD, SVG-D1, SVG-OM1.  . Chronic combined systolic and diastolic CHF (congestive heart failure)     a. Prior EF 45-50% in 08/2014. b. Improved EF >55% in 09/2014 at [facility].  . Type II diabetes mellitus   . Anemia     a. + FOBT  08/2014.  Marland Kitchen History of hiatal hernia   . Daily headache   . Arthritis   . Chronic lower back pain   . CKD (chronic kidney disease) stage 3, GFR 30-59 ml/min   . Skin cancer   . Polymyalgia rheumatica   . SVT (supraventricular tachycardia)   . Atrial fibrillation   . MI (myocardial infarction) 1999-02/2014    total of 7 MI per patient  . Carotid artery occlusion     a. Duplex 12/2013: mild plaque bilat, 40-59% BICA. F/u due 12/2014.  Marland Kitchen Atherosclerosis of native arteries of the extremities with intermittent claudication     a. L external iliac stent 2001, bilateral SFA occlusions documented 2001.  Marland Kitchen OSA (obstructive sleep apnea)     "don't wear mask"   . Hypotonic bladder     Requiring in and out catheterization performed by the patient at home - occasional blood tinged urine (self-cath's daily for the last 2 years) and hematuria is chronic for him, followed by urology.  Marland Kitchen COPD (chronic obstructive pulmonary disease)     pt states he doesn't have      Medication List    TAKE these medications        acetaminophen 325 MG tablet  Commonly known as:  TYLENOL  Take 650 mg by mouth every 6 (six) hours as needed for moderate pain.     amLODipine 10 MG tablet  Commonly known as:  NORVASC  Take 10 mg by mouth every morning.     aspirin 81 MG EC tablet  Take 1 tablet (81 mg total) by mouth daily.     atorvastatin 20 MG  tablet  Commonly known as:  LIPITOR  Take 20 mg by mouth daily at 6 PM.     cholecalciferol 1000 UNITS tablet  Commonly known as:  VITAMIN D  Take 1,000 Units by mouth daily.     clopidogrel 75 MG tablet  Commonly known as:  PLAVIX  Take 1 tablet (75 mg total) by mouth daily.     Cranberry 500 MG Caps  Take 1 capsule by mouth at bedtime.     ferrous sulfate 325 (65 FE) MG tablet  Take 325 mg by mouth every evening.     glipiZIDE 10 MG tablet  Commonly known as:  GLUCOTROL  Take 1 tablet (10 mg total) by mouth 2 (two) times daily before a meal.     hydrALAZINE 50 MG tablet  Commonly known as:  APRESOLINE  Take 50 mg by mouth 3 (three) times daily.     Insulin Glargine 100 UNIT/ML Solostar Pen  Commonly known as:  LANTUS  Inject 10 Units into the skin every morning.     Insulin Pen Needle 31G X 5 MM Misc  Commonly known as:  FIFTY50 PEN NEEDLES  As directed     irbesartan 150 MG tablet  Commonly known as:  AVAPRO  Take 150 mg by mouth daily.     isosorbide mononitrate 120 MG 24 hr tablet  Commonly known as:  IMDUR  TAKE 1 TABLET BY MOUTH EVERY DAY     metoprolol 50 MG tablet  Commonly known as:  LOPRESSOR  Take 1 tablet (50 mg total) by mouth 2 (two) times daily.     multivitamin with minerals tablet  Take 1 tablet by mouth daily.     nitroGLYCERIN 0.4 MG SL tablet  Commonly known as:  NITROSTAT  Place 0.4 mg under the tongue every 5 (five) minutes as needed for  chest pain.     oxyCODONE 5 MG immediate release tablet  Commonly known as:  ROXICODONE  Take 1 tablet (5 mg total) by mouth every 6 (six) hours as needed.     pantoprazole 40 MG tablet  Commonly known as:  PROTONIX  Take 1 tablet (40 mg total) by mouth daily.     torsemide 20 MG tablet  Commonly known as:  DEMADEX  Take 2 tablets by mouth once a day        Prescriptions given: Roxicodone #30 No Refill  Instructions: 1.  Pt is to continue his current BP medications including his ARB.  His  creatinine is elevated at 2.1 at discharge.  He has an appointment with Dr. Posey Pronto on 02/05/15 next week.  Pt states Dr. Posey Pronto is aware of his medications and renal function.    Disposition: home  Patient's condition: is Good  Follow up: 1. Dr. Scot Dock in 2 weeks. 2. Dr. Posey Pronto 02/05/15 3. Cardiology 02/04/15   Leontine Locket, PA-C [facility] [phone]    --- For [facility] use --- Instructions: Press F2 to tab through selections.  Delete question if not applicable.   Modified Rankin score at D/C (0-6): 0  IV medication needed for:  1. Hypertension: No 2. Hypotension: No  Post-op Complications: reperfusion HA  1. Post-op CVA or TIA: No  If yes: Event classification (right eye, left eye, right cortical, left cortical, verterobasilar, other): n/a  If yes: Timing of event (intra-op, <6 hrs post-op, >=6 hrs post-op, unknown): n/a  2. CN injury: No  If yes: CN n/a injuried   3. Myocardial infarction: No  If yes: Dx by (EKG or clinical, Troponin): n/a  4.  CHF: No  5.  Dysrhythmia (new): No  6. Wound infection: No  7. Reperfusion symptoms: yes-reperfusion HA-CT head negative  8. Return to OR: No  If yes: return to OR for (bleeding, neurologic, other CEA incision, other): n/a  Discharge medications: Statin use:  Yes If No: [ ]  For Medical reasons, [ ]  Non-compliant, [ ]  Not-indicated ASA use:  Yes  If No: [ ]  For Medical reasons, [ ]  Non-compliant, [ ]  Not-indicated Beta blocker use:  Yes If No: [ ]  For Medical reasons, [ ]  Non-compliant, [ ]  Not-indicated ARB use:  Yes If No: [ ]  For Medical reasons, [ ]  Non-compliant, [ ]  Not-indicated P2Y12 Antagonist use: Yes, [x ] Plavix, [ ]  Plasugrel, [ ]  Ticlopinine, [ ]  Ticagrelor, [ ]  Other, [ ]  No for medical reason, [ ]  Non-compliant, [ ]  Not-indicated Anti-coagulant use:  No, [ ]  Warfarin, [ ]  Rivaroxaban, [ ]  Dabigatran, [ ]  Other, [ ]  No for medical reason, [ ]  Non-compliant, [ ]   Not-indicated

## 2015-02-03 ENCOUNTER — Encounter (HOSPITAL_COMMUNITY): Payer: Medicare Other

## 2015-02-03 ENCOUNTER — Encounter (HOSPITAL_COMMUNITY): Payer: Self-pay | Admitting: Vascular Surgery

## 2015-02-03 ENCOUNTER — Telehealth: Payer: Self-pay | Admitting: Vascular Surgery

## 2015-02-03 ENCOUNTER — Other Ambulatory Visit: Payer: Self-pay

## 2015-02-03 DIAGNOSIS — G8918 Other acute postprocedural pain: Secondary | ICD-10-CM

## 2015-02-03 MED ORDER — TRAMADOL HCL 50 MG PO TABS: 50.0000 mg | ORAL_TABLET | Freq: Four times a day (QID) | ORAL | Status: DC | PRN

## 2015-02-03 NOTE — Telephone Encounter (Signed)
-----   Message from Mena Goes, RN sent at 01/30/2015 10:30 AM EDT ----- Regarding: Schedule   ----- Message -----    From: Gabriel Earing, PA-C    Sent: 01/30/2015  10:00 AM      To: [facility] Charge Pool  S/p left CEA 01/30/15.  F/u with Dr. Scot Dock in 2 weeks.  Thanks, Aldona Bar

## 2015-02-03 NOTE — Telephone Encounter (Signed)
Spoke with patient, dpm °

## 2015-02-04 ENCOUNTER — Ambulatory Visit (INDEPENDENT_AMBULATORY_CARE_PROVIDER_SITE_OTHER): Payer: Medicare Other | Admitting: Interventional Cardiology

## 2015-02-04 ENCOUNTER — Encounter: Payer: Self-pay | Admitting: Interventional Cardiology

## 2015-02-04 VITALS — BP 130/50 | HR 34 | Ht 66.0 in | Wt 160.0 lb

## 2015-02-04 DIAGNOSIS — Z951 Presence of aortocoronary bypass graft: Secondary | ICD-10-CM | POA: Diagnosis not present

## 2015-02-04 DIAGNOSIS — I110 Hypertensive heart disease with heart failure: Secondary | ICD-10-CM

## 2015-02-04 DIAGNOSIS — I5032 Chronic diastolic (congestive) heart failure: Secondary | ICD-10-CM | POA: Diagnosis not present

## 2015-02-04 DIAGNOSIS — I10 Essential (primary) hypertension: Secondary | ICD-10-CM | POA: Diagnosis not present

## 2015-02-04 DIAGNOSIS — I6523 Occlusion and stenosis of bilateral carotid arteries: Secondary | ICD-10-CM

## 2015-02-04 DIAGNOSIS — N183 Chronic kidney disease, stage 3 unspecified: Secondary | ICD-10-CM

## 2015-02-04 DIAGNOSIS — I25708 Atherosclerosis of coronary artery bypass graft(s), unspecified, with other forms of angina pectoris: Secondary | ICD-10-CM

## 2015-02-04 DIAGNOSIS — I70219 Atherosclerosis of native arteries of extremities with intermittent claudication, unspecified extremity: Secondary | ICD-10-CM

## 2015-02-04 DIAGNOSIS — I509 Heart failure, unspecified: Secondary | ICD-10-CM

## 2015-02-04 NOTE — Patient Instructions (Signed)
Medication Instructions:  STOP Amlodipine STOP Irbesartan  Labwork: None   Testing/Procedures: None   Follow-Up: Your physician recommends that you schedule a follow-up appointment in: 2 months   Any Other Special Instructions Will Be Listed Below (If Applicable). Monitor your blood pressure regularly at home. Call the office if your blood pressure is consistently elevated

## 2015-02-04 NOTE — Progress Notes (Signed)
Cardiology Office Note   Date:  02/04/2015   ID:  ANWAR SAKATA, DOB [birth year], MRN [id number]  PCP:  Shirline Frees, MD  Cardiologist:   Sinclair Grooms, MD   Chief Complaint  Patient presents with  . Hypertension    caroid artery disease  . Coronary Artery Disease      History of Present Illness: Axel C Demello is a 74 y.o. male who presents for CAD with CABG, Hypertension, Carotid disease with recent Left CEA, CKD, and chronic diastolic heart failure.   The patient underwent left carotid endarterectomy 4 days ago. He now has a bladder tumor that needs to be resected by Dr.  Karsten Ro. He denies angina. He is not short of breath. There is no peripheral edema. Overall he feels well but has been hoarse as in surgery.    Past Medical History  Diagnosis Date  . Hyperlipidemia   . Hypertension   . GERD (gastroesophageal reflux disease)   . Esophageal stricture     a. History of dilitation - Long-standing history of esophageal reflux   . 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 1999: LIMA to LAD, sequential SVG to diagonal 2 and diagonal 3, sequential SVG to PDA, acute marginal branch, and PL branch. b. Repeat CABG 2015 with SVG to D1, SVG to PDA, and SVG to OM.   Marland Kitchen CAD (coronary artery disease), native coronary artery 09/24/2013    a. CABG x 6 1999. b. Interim PCIs: Angioplasty SVG-PDA 2006.Taxus DES to native LCx in 2007.DES to SVG-PDA 2007, restented 2009.NSTEMI with PCI to SVG- PDA complicated by no-reflow/inferior infarction in 2012. c. Redo CABG 2015 with SVG-PD, SVG-D1, SVG-OM1.  . Chronic combined systolic and diastolic CHF (congestive heart failure)     a. Prior EF 45-50% in 08/2014. b. Improved EF >55% in 09/2014 at [facility].  . Type II diabetes mellitus   . Anemia     a. + FOBT 08/2014.  Marland Kitchen History of hiatal hernia   . Daily headache   . Arthritis   . Chronic lower back pain   . CKD (chronic kidney disease) stage 3, GFR 30-59 ml/min   . Skin cancer   . Polymyalgia  rheumatica   . SVT (supraventricular tachycardia)   . Atrial fibrillation   . MI (myocardial infarction) 1999-02/2014    total of 7 MI per patient  . Carotid artery occlusion     a. Duplex 12/2013: mild plaque bilat, 40-59% BICA. F/u due 12/2014.  Marland Kitchen Atherosclerosis of native arteries of the extremities with intermittent claudication     a. L external iliac stent 2001, bilateral SFA occlusions documented 2001.  Marland Kitchen OSA (obstructive sleep apnea)     "don't wear mask"   . Hypotonic bladder     Requiring in and out catheterization performed by the patient at home - occasional blood tinged urine (self-cath's daily for the last 2 years) and hematuria is chronic for him, followed by urology.  Marland Kitchen COPD (chronic obstructive pulmonary disease)     pt states he doesn't have    Past Surgical History  Procedure Laterality Date  . Coronary stent placement      "I've had 8 put in; I presently have none" (08/26/2014)  . Cardiac catheterization    . Intraoperative transesophageal echocardiogram N/A 03/01/2014    Procedure: INTRAOPERATIVE TRANSESOPHAGEAL ECHOCARDIOGRAM;  Surgeon: Gaye Pollack, MD;  Location: [facility] OR;  Service: Open Heart Surgery;  Laterality: N/A;  . Anterior cervical decomp/discectomy fusion    .  Ankle fracture surgery Right ~ 1977    "10 plates and 7 screws placed in it"  . Tonsillectomy    . Fracture surgery    . Cataract extraction w/ intraocular lens  implant, bilateral Bilateral 2000's  . Mohs surgery  X 2    "nose; nose"  . Skin cancer excision      "all over my head; ears; back  . Esophagogastroduodenoscopy (egd) with esophageal dilation  X 1  . Back surgery      neck surgery  . Coronary artery bypass graft  1999  . Coronary artery bypass graft N/A 03/01/2014    Procedure: REDO CORONARY ARTERY BYPASS GRAFTING TIMES THREE, USING RIGHT GREATER SAPHENOUS VEIN VIA ENDOVEIN HARVEST.;  Surgeon: Gaye Pollack, MD;  Location: [facility] OR;  Service: Open Heart Surgery;  Laterality: N/A;  .  Left heart catheterization with coronary/graft angiogram  02/26/2014    Procedure: LEFT HEART CATHETERIZATION WITH Beatrix Fetters;  Surgeon: Sinclair Grooms, MD;  Location: [facility] CATH LAB;  Service: Cardiovascular;;  . Left heart catheterization with coronary/graft angiogram N/A 07/23/2014    Procedure: LEFT HEART CATHETERIZATION WITH Beatrix Fetters;  Surgeon: Sinclair Grooms, MD;  Location: [facility] CATH LAB;  Service: Cardiovascular;  Laterality: N/A;  . Left heart catheterization with coronary/graft angiogram N/A 08/30/2014    Procedure: LEFT HEART CATHETERIZATION WITH Beatrix Fetters;  Surgeon: Leonie Man, MD;  Location: [facility] CATH LAB;  Service: Cardiovascular;  Laterality: N/A;  . Abdominal aortagram N/A 11/27/2014    Procedure: ABDOMINAL Maxcine Ham;  Surgeon: Wellington Hampshire, MD;  Location: [facility] CATH LAB;  Service: Cardiovascular;  Laterality: N/A;  . Multiple tooth extractions    . Colonoscopy    . Endarterectomy Left 01/30/2015    Procedure: ENDARTERECTOMY CAROTID WITH PRIMARY CLOSURE OF ARTERY;  Surgeon: Angelia Mould, MD;  Location: [facility];  Service: Vascular;  Laterality: Left;     Current Outpatient Prescriptions  Medication Sig Dispense Refill  . acetaminophen (TYLENOL) 325 MG tablet Take 650 mg by mouth every 6 (six) hours as needed for moderate pain.    Marland Kitchen aspirin EC 81 MG EC tablet Take 1 tablet (81 mg total) by mouth daily. 1 tablet 0  . atorvastatin (LIPITOR) 20 MG tablet Take 20 mg by mouth daily at 6 PM.     . cholecalciferol (VITAMIN D) 1000 UNITS tablet Take 1,000 Units by mouth daily.    . clopidogrel (PLAVIX) 75 MG tablet Take 1 tablet (75 mg total) by mouth daily. 90 tablet 3  . Cranberry 500 MG CAPS Take 1 capsule by mouth at bedtime.    . ferrous sulfate 325 (65 FE) MG tablet Take 325 mg by mouth every evening.     Marland Kitchen glipiZIDE (GLUCOTROL) 10 MG tablet Take 1 tablet (10 mg total) by mouth 2 (two) times daily before a meal. 60 tablet 0  .  hydrALAZINE (APRESOLINE) 50 MG tablet Take 50 mg by mouth 3 (three) times daily.    . Insulin Glargine (LANTUS) 100 UNIT/ML Solostar Pen Inject 10 Units into the skin every morning. (Patient taking differently: Inject 10 Units into the skin daily. ) 15 mL 1  . Insulin Pen Needle (FIFTY50 PEN NEEDLES) 31G X 5 MM MISC As directed 30 each 1  . isosorbide mononitrate (IMDUR) 120 MG 24 hr tablet TAKE 1 TABLET BY MOUTH EVERY DAY 30 tablet 6  . metoprolol (LOPRESSOR) 50 MG tablet Take 1 tablet (50 mg total) by mouth 2 (two) times  daily. 60 tablet 11  . Multiple Vitamins-Minerals (MULTIVITAMIN WITH MINERALS) tablet Take 1 tablet by mouth daily.    . nitroGLYCERIN (NITROSTAT) 0.4 MG SL tablet Place 0.4 mg under the tongue every 5 (five) minutes as needed for chest pain.    . pantoprazole (PROTONIX) 40 MG tablet Take 1 tablet (40 mg total) by mouth daily. 90 tablet 3  . torsemide (DEMADEX) 20 MG tablet Take 2 tablets by mouth once a day    . traMADol (ULTRAM) 50 MG tablet Take 1 tablet (50 mg total) by mouth every 6 (six) hours as needed. 20 tablet 0   No current facility-administered medications for this visit.   Facility-Administered Medications Ordered in Other Visits  Medication Dose Route Frequency Provider Last Rate Last Dose  . levofloxacin (LEVAQUIN) IVPB 400 mg  400 mg Intravenous Q24H Kathie Rhodes, MD        Allergies:   Review of patient's allergies indicates no known allergies.    Social History:  The patient  reports that he quit smoking about 11 months ago. His smoking use included Cigarettes. He has a 50 pack-year smoking history. He has never used smokeless tobacco. He reports that he does not drink alcohol or use illicit drugs.   Family History:  The patient's family history includes CAD in his mother; Cirrhosis in his brother; Heart attack in his father; Heart disease in his father and mother; Hyperlipidemia in his father; Hypertension in his father and mother.    ROS:  Please see  the history of present illness.   Otherwise, review of systems are positive for  Worsen as since recent surgery.   All other systems are reviewed and negative.    PHYSICAL EXAM: VS:  BP 130/50 mmHg  Pulse 34  Ht 5\' 6"  (1.676 m)  Wt 160 lb (72.576 kg)  BMI 25.84 kg/m2 , BMI Body mass index is 25.84 kg/(m^2). GEN: Well nourished, well developed, in no acute distress HEENT: normal Neck: no JVD, carotid bruits, or masses Cardiac: RRR; no murmurs, rubs, or gallops,no edema  Respiratory:  clear to auscultation bilaterally, normal work of breathing GI: soft, nontender, nondistended, + BS MS: no deformity or atrophy Skin: warm and dry, no rash Neuro:  Strength and sensation are intact Psych: euthymic mood, full affect   EKG:  EKG is not ordered today.   Recent Labs: 08/27/2014: Pro B Natriuretic peptide (BNP) 2854.0* 08/30/2014: TSH 2.860 09/02/2014: Magnesium 2.0 01/22/2015: ALT 37 01/31/2015: BUN 50*; Creatinine 2.19*; Hemoglobin 10.0*; Platelets 170; Potassium 5.5*; Sodium 138    Lipid Panel    Component Value Date/Time   CHOL 133 09/03/2014 0403   TRIG 138 09/03/2014 0403   HDL 41 09/03/2014 0403   CHOLHDL 3.2 09/03/2014 0403   VLDL 28 09/03/2014 0403   LDLCALC 64 09/03/2014 0403      Wt Readings from Last 3 Encounters:  02/04/15 160 lb (72.576 kg)  01/30/15 165 lb 5.5 oz (75 kg)  01/22/15 160 lb (72.576 kg)      Other studies Reviewed: Additional studies/ records that were reviewed today include:  Bladder tumor.  Recent renal function Review of the above records demonstrates:  Potassium is been running high normal and creatinine is greater than 2.   ASSESSMENT AND PLAN:  Hx of CABG: Angina has been stable  Essential hypertension: Low levels  Chronic diastolic heart failure: No evidence of volume overload  CKD (chronic kidney disease) stage 3, GFR 30-59 ml/min:  Recent kidney function is closer to  stage IV. For that reason I will discontinue Irbesartan   because of concern about  Hyperkalemia. I feel the patient may be mildly volume contracted  Hypertensive heart disease with heart failure  Coronary artery disease involving coronary bypass graft of native heart with other forms of angina pectoris  Atherosclerosis of native arteries of extremity with intermittent claudication     Current medicines are reviewed at length with the patient today.  The patient does not have concerns regarding medicines.  The following changes have been made:   Discontinue amlodipine and Irbesartan. We will use hydralazine and nitrates for blood pressure and heart failure.  Labs/ tests ordered today include:  No orders of the defined types were placed in this encounter.     Disposition:   FU with Linard Millers in 2 months   Signed, Sinclair Grooms, MD  02/04/2015 10:24 AM    [facility] [place], [place], [place]  [address] Phone: [phone]; Fax: [phone]

## 2015-02-05 ENCOUNTER — Encounter (HOSPITAL_COMMUNITY): Payer: Medicare Other

## 2015-02-05 DIAGNOSIS — D631 Anemia in chronic kidney disease: Secondary | ICD-10-CM | POA: Diagnosis not present

## 2015-02-05 DIAGNOSIS — I10 Essential (primary) hypertension: Secondary | ICD-10-CM | POA: Diagnosis not present

## 2015-02-05 DIAGNOSIS — N183 Chronic kidney disease, stage 3 (moderate): Secondary | ICD-10-CM | POA: Diagnosis not present

## 2015-02-05 DIAGNOSIS — N2581 Secondary hyperparathyroidism of renal origin: Secondary | ICD-10-CM | POA: Diagnosis not present

## 2015-02-07 ENCOUNTER — Encounter (HOSPITAL_COMMUNITY): Payer: Medicare Other

## 2015-02-10 ENCOUNTER — Encounter (HOSPITAL_COMMUNITY): Payer: Medicare Other

## 2015-02-12 ENCOUNTER — Encounter (HOSPITAL_COMMUNITY): Payer: Medicare Other

## 2015-02-12 ENCOUNTER — Other Ambulatory Visit: Payer: Self-pay | Admitting: Urology

## 2015-02-14 ENCOUNTER — Encounter (HOSPITAL_COMMUNITY): Payer: Medicare Other

## 2015-02-17 ENCOUNTER — Encounter (HOSPITAL_COMMUNITY): Payer: Medicare Other

## 2015-02-18 ENCOUNTER — Encounter: Payer: Self-pay | Admitting: Vascular Surgery

## 2015-02-18 DIAGNOSIS — N183 Chronic kidney disease, stage 3 (moderate): Secondary | ICD-10-CM | POA: Diagnosis not present

## 2015-02-19 ENCOUNTER — Telehealth: Payer: Self-pay | Admitting: Interventional Cardiology

## 2015-02-19 ENCOUNTER — Ambulatory Visit (INDEPENDENT_AMBULATORY_CARE_PROVIDER_SITE_OTHER): Payer: Self-pay | Admitting: Vascular Surgery

## 2015-02-19 ENCOUNTER — Encounter (HOSPITAL_COMMUNITY): Payer: Medicare Other

## 2015-02-19 ENCOUNTER — Encounter: Payer: Self-pay | Admitting: Vascular Surgery

## 2015-02-19 VITALS — BP 148/46 | HR 50 | Ht 66.0 in | Wt 160.3 lb

## 2015-02-19 DIAGNOSIS — Z48812 Encounter for surgical aftercare following surgery on the circulatory system: Secondary | ICD-10-CM

## 2015-02-19 NOTE — Telephone Encounter (Signed)
Left message with spouse and left message on mobile phone to call back.

## 2015-02-19 NOTE — Progress Notes (Signed)
Patient name: [name] MRN: [id number] DOB: [birth year] Sex: male   VQI PATIENT  REASON FOR VISIT: Follow up after left carotid endarterectomy with primary closure.  HPI: [name] is a 74 y.o. male who presented with a greater than 80% left carotid stenosis which was asymptomatic. Of note he had a 40-59% right carotid stenosis. He had a very extensive plaque involving the left carotid system and underwent extensive left carotid endarterectomy with primary closure. The plaque extended very low into the common carotid artery down to the level of the clavicle. Distally the plaque extended to the base of the skull. I had to clamp proximally 3 cm above the hypoglossal nerve. He comes in for his first postop visit.  He has no specific complaints except that he does have some hoarseness. He denies focal weakness or paresthesias. He denies any problems swallowing.  Current Outpatient Prescriptions  Medication Sig Dispense Refill  . acetaminophen (TYLENOL) 325 MG tablet Take 650 mg by mouth every 6 (six) hours as needed for moderate pain.    Marland Kitchen aspirin EC 81 MG EC tablet Take 1 tablet (81 mg total) by mouth daily. 1 tablet 0  . atorvastatin (LIPITOR) 20 MG tablet Take 20 mg by mouth daily at 6 PM.     . cholecalciferol (VITAMIN D) 1000 UNITS tablet Take 1,000 Units by mouth daily.    . clopidogrel (PLAVIX) 75 MG tablet Take 1 tablet (75 mg total) by mouth daily. 90 tablet 3  . Cranberry 500 MG CAPS Take 1 capsule by mouth at bedtime.    . ferrous sulfate 325 (65 FE) MG tablet Take 325 mg by mouth every evening.     Marland Kitchen glipiZIDE (GLUCOTROL) 10 MG tablet Take 1 tablet (10 mg total) by mouth 2 (two) times daily before a meal. 60 tablet 0  . hydrALAZINE (APRESOLINE) 50 MG tablet Take 50 mg by mouth 3 (three) times daily.    . Insulin Glargine (LANTUS) 100 UNIT/ML Solostar Pen Inject 10 Units into the skin every morning. (Patient taking differently: Inject 10 Units into the skin daily. ) 15 mL  1  . Insulin Pen Needle (FIFTY50 PEN NEEDLES) 31G X 5 MM MISC As directed 30 each 1  . isosorbide mononitrate (IMDUR) 120 MG 24 hr tablet TAKE 1 TABLET BY MOUTH EVERY DAY 30 tablet 6  . metoprolol (LOPRESSOR) 50 MG tablet Take 1 tablet (50 mg total) by mouth 2 (two) times daily. 60 tablet 11  . Multiple Vitamins-Minerals (MULTIVITAMIN WITH MINERALS) tablet Take 1 tablet by mouth daily.    . nitroGLYCERIN (NITROSTAT) 0.4 MG SL tablet Place 0.4 mg under the tongue every 5 (five) minutes as needed for chest pain.    . pantoprazole (PROTONIX) 40 MG tablet Take 1 tablet (40 mg total) by mouth daily. 90 tablet 3  . torsemide (DEMADEX) 20 MG tablet Take 2 tablets by mouth once a day    . traMADol (ULTRAM) 50 MG tablet Take 1 tablet (50 mg total) by mouth every 6 (six) hours as needed. 20 tablet 0   No current facility-administered medications for this visit.   Facility-Administered Medications Ordered in Other Visits  Medication Dose Route Frequency Provider Last Rate Last Dose  . levofloxacin (LEVAQUIN) IVPB 400 mg  400 mg Intravenous Q24H Kathie Rhodes, MD       REVIEW OF SYSTEMS: Valu.Nieves ] denotes positive finding; [  ] denotes negative finding  CARDIOVASCULAR:  [ ]  chest pain   [ ]   dyspnea on exertion    CONSTITUTIONAL:  [ ]  fever   [ ]  chills  PHYSICAL EXAM: Filed Vitals:   02/19/15 1543 02/19/15 1546  BP: 149/58 148/46  Pulse: 44 50  Height: 5\' 6"  (1.676 m)   Weight: 160 lb 4.8 oz (72.712 kg)   SpO2: 100%    GENERAL: The patient is a well-nourished male, in no acute distress. The vital signs are documented above. CARDIOVASCULAR: There is a regular rate and rhythm. PULMONARY: There is good air exchange bilaterally without wheezing or rales. His left neck incision is healing nicely. He does have some hoarseness. As no focal weakness or paresthesias.  MEDICAL ISSUES:  BILATERAL CAROTID DISEASE: The patient is doing well status post extensive left carotid endarterectomy for a very  extensive lesion that extended very high and very low. He does have some hoarseness and I suspect this will improve with time. He has a moderate right carotid stenosis and I ordered a follow up carotid duplex scan in 6 months. I'll see him back at that time. He is on aspirin. He is on a statin. He is also on Plavix. He no skull sooner if he has problems.  Deitra Mayo [facility]: [phone]

## 2015-02-19 NOTE — Telephone Encounter (Signed)
New message    Patient calling    Pt c/o medication issue:  1. Name of Medication: amlopdine & isosorbide   2. How are you currently taking this medication (dosage and times per day) ?  Medication was d/c   3. Are you having a reaction (difficulty breathing--STAT)? Blood pressure issues   4. What is your medication issue? Patient need to discuss why he went back on amlopdine .

## 2015-02-19 NOTE — Telephone Encounter (Signed)
New Message   Patient is returning nurses call. Please give patient a call back.

## 2015-02-19 NOTE — Telephone Encounter (Signed)
The patient is okay to resume amlodipine 10 mg per day. He needs to come in for a basic metabolic panel so that we can determine if her irbesartan can be restarted.

## 2015-02-19 NOTE — Telephone Encounter (Signed)
Patient called back. Patient wants Dr. Tamala Julian to know he started himself back on Amlodipine 10 mg daily. Patient also cut back his hydralazine to none in the morning,  50 mg PRN in the afternoons, and takes 50 mg nightly, instead of hydralazine 50 mg three times daily. Patient stated that his SBP's were 150-140's, now his SBP's are in the 120's. Informed patient that this message would be sent to Dr. Tamala Julian for his approval of changes.

## 2015-02-20 MED ORDER — AMLODIPINE BESYLATE 10 MG PO TABS: 10.0000 mg | ORAL_TABLET | Freq: Every day | ORAL | Status: DC

## 2015-02-20 NOTE — Telephone Encounter (Signed)
Called patient informed him of Dr. Thompson Caul note below. Patient stated he just had labs with [facility] on Tuesday. Will send to Dr. Thompson Caul nurse to follow-up.

## 2015-02-21 ENCOUNTER — Encounter (HOSPITAL_COMMUNITY): Payer: Medicare Other

## 2015-02-21 DIAGNOSIS — N3000 Acute cystitis without hematuria: Secondary | ICD-10-CM | POA: Diagnosis not present

## 2015-02-21 DIAGNOSIS — I10 Essential (primary) hypertension: Secondary | ICD-10-CM | POA: Diagnosis not present

## 2015-02-21 DIAGNOSIS — N183 Chronic kidney disease, stage 3 (moderate): Secondary | ICD-10-CM | POA: Diagnosis not present

## 2015-02-21 DIAGNOSIS — E780 Pure hypercholesterolemia: Secondary | ICD-10-CM | POA: Diagnosis not present

## 2015-02-21 DIAGNOSIS — E1165 Type 2 diabetes mellitus with hyperglycemia: Secondary | ICD-10-CM | POA: Diagnosis not present

## 2015-02-21 NOTE — Patient Instructions (Addendum)
[name]  02/21/2015   Your procedure is scheduled on:  03-03-15  Report to [facility] Main  Entrance and follow signs to               [place] at 5:30 AM.  Call this number if you have problems the morning of surgery [phone]   Remember: ONLY 1 PERSON MAY GO WITH YOU TO SHORT STAY TO GET  READY MORNING OF [place].  Do not eat food or drink liquids :After Midnight.     Take these medicines the morning of surgery with A SIP OF WATER:Amlodipine, Metoporolol, Isosorbide Mononitrate                               You may not have any metal on your body including hair pins and              piercings  Do not wear jewelry, make-up, lotions, powders or perfumes., deodorant                       Men may shave face and neck.   Do not bring valuables to the hospital. Alex.  Contacts, dentures or bridgework may not be worn into surgery.  Leave suitcase in the car. After surgery it may be brought to your room.     Patients discharged the day of surgery will not be allowed to drive home.  Name and phone number of your driver: [name]  [phone]  Special Instructions: coughing and deep breathing exercises, [facility] exercises[facility] - Preparing for Surgery Before surgery, you can play an important role.  Because skin is not sterile, your skin needs to be as free of germs as possible.  You can reduce the number of germs on your skin by washing with CHG (chlorahexidine gluconate) soap before surgery.  CHG is an antiseptic cleaner which kills germs and bonds with the skin to continue killing germs even after washing. Please DO NOT use if you have an allergy to CHG or antibacterial soaps.  If your skin becomes reddened/irritated stop using the CHG and inform your nurse when you arrive at Short Stay. Do not shave (including legs and underarms) for at least 48 hours prior to the first CHG shower.  You may  shave your face/neck. Please follow these instructions carefully:  1.  Shower with CHG Soap the night before surgery and the  morning of Surgery.  2.  If you choose to wash your hair, wash your hair first as usual with your  normal  shampoo.  3.  After you shampoo, rinse your hair and body thoroughly to remove the  shampoo.                           4.  Use CHG as you would any other liquid soap.  You can apply chg directly  to the skin and wash                       Gently with a scrungie or clean washcloth.  5.  Apply the CHG Soap to your body ONLY FROM THE NECK DOWN.   Do not  use on face/ open                           Wound or open sores. Avoid contact with eyes, ears mouth and genitals (private parts).                       Wash face,  Genitals (private parts) with your normal soap.             6.  Wash thoroughly, paying special attention to the area where your surgery  will be performed.  7.  Thoroughly rinse your body with warm water from the neck down.  8.  DO NOT shower/wash with your normal soap after using and rinsing off  the CHG Soap.                9.  Pat yourself dry with a clean towel.            10.  Wear clean pajamas.            11.  Place clean sheets on your bed the night of your first shower and do not  sleep with pets. Day of Surgery : Do not apply any lotions/deodorants the morning of surgery.  Please wear clean clothes to the hospital/surgery center.  FAILURE TO FOLLOW THESE INSTRUCTIONS MAY RESULT IN THE CANCELLATION OF YOUR SURGERY PATIENT SIGNATURE_________________________________  NURSE SIGNATURE__________________________________  ________________________________________________________________________               Please read over the following fact sheets you were given: _____________________________________________________________________

## 2015-02-24 ENCOUNTER — Encounter (HOSPITAL_COMMUNITY): Payer: Medicare Other

## 2015-02-25 ENCOUNTER — Encounter (HOSPITAL_COMMUNITY)
Admission: RE | Admit: 2015-02-25 | Discharge: 2015-02-25 | Disposition: A | Discharge status: Home / Self Care | Payer: Medicare Other | Source: Ambulatory Visit | Attending: Urology | Admitting: Urology

## 2015-02-25 ENCOUNTER — Encounter (HOSPITAL_COMMUNITY): Payer: Self-pay

## 2015-02-25 ENCOUNTER — Inpatient Hospital Stay (HOSPITAL_COMMUNITY): Admission: RE | Admit: 2015-02-25 | Payer: Medicare Other | Source: Ambulatory Visit

## 2015-02-25 DIAGNOSIS — Z01810 Encounter for preprocedural cardiovascular examination: Secondary | ICD-10-CM | POA: Insufficient documentation

## 2015-02-25 DIAGNOSIS — Z01812 Encounter for preprocedural laboratory examination: Secondary | ICD-10-CM | POA: Diagnosis not present

## 2015-02-25 LAB — CBC
HCT: 39.6 % (ref 39.0–52.0)
Hemoglobin: 12.9 g/dL — ABNORMAL LOW (ref 13.0–17.0)
MCH: 26.6 pg (ref 26.0–34.0)
MCHC: 32.6 g/dL (ref 30.0–36.0)
MCV: 81.6 fL (ref 78.0–100.0)
Platelets: 195 10*3/uL (ref 150–400)
RBC: 4.85 MIL/uL (ref 4.22–5.81)
RDW: 16.6 % — ABNORMAL HIGH (ref 11.5–15.5)
WBC: 10 10*3/uL (ref 4.0–10.5)

## 2015-02-25 LAB — BASIC METABOLIC PANEL
ANION GAP: 12 (ref 5–15)
BUN: 44 mg/dL — ABNORMAL HIGH (ref 6–20)
CHLORIDE: 99 mmol/L — AB (ref 101–111)
CO2: 29 mmol/L (ref 22–32)
CREATININE: 1.86 mg/dL — AB (ref 0.61–1.24)
Calcium: 9.8 mg/dL (ref 8.9–10.3)
GFR calc Af Amer: 40 mL/min — ABNORMAL LOW (ref >60)
GFR, EST NON AFRICAN AMERICAN: 34 mL/min — AB (ref >60)
GLUCOSE: 228 mg/dL — AB (ref 70–99)
POTASSIUM: 4.3 mmol/L (ref 3.5–5.1)
Sodium: 140 mmol/L (ref 135–145)

## 2015-02-25 NOTE — Telephone Encounter (Signed)
Pt has had recent bmet at [facility]. Message routed to Dr.Smith to review results

## 2015-02-25 NOTE — Progress Notes (Signed)
Called pam gibson and left message any undated cardiac or vascular clearances available for 03-03-15 surgery please fax clearances,

## 2015-02-25 NOTE — Progress Notes (Signed)
CXR 01-17-15 EPIC

## 2015-02-25 NOTE — Progress Notes (Signed)
BMET faxed to Dr. Karsten Ro by [facility]

## 2015-02-25 NOTE — Progress Notes (Addendum)
Spoke with dr Delma Post reviewed pt history, anesthesia will see pt morning of surgery

## 2015-02-26 ENCOUNTER — Encounter (HOSPITAL_COMMUNITY): Payer: Medicare Other

## 2015-02-26 NOTE — Telephone Encounter (Signed)
After reviewing the labs iof 02/25/15, Kidney function will not allow [place] to resume Irbesartan to help with the BP.

## 2015-02-26 NOTE — Progress Notes (Signed)
Spoke with Dr. Delma Post.  Made aware of 02-25-15 EKG results. Patient OK for surgery

## 2015-02-27 NOTE — Telephone Encounter (Signed)
Called to give pt Dr.Smiths recommendation. [email or web address] 

## 2015-02-28 ENCOUNTER — Encounter (HOSPITAL_COMMUNITY): Payer: Medicare Other

## 2015-02-28 NOTE — Telephone Encounter (Signed)
Continue careful monitoring and using hydralazine as needed if SBP > 150 mmHg. Should not use hydralazine more than twice daily.

## 2015-02-28 NOTE — Telephone Encounter (Signed)
Spoke with pt who reports he is taking Hydralazine usually just once a day.  He is adjusting his meds himself based on his BP.  Lately his BP has been 150/50 in the AM and he takes Amlodipine.  At 12N his BP is around 110-120/? And he doesn't take anything but his BP is back up around 140-150/? In the evening - when this occurs he takes Hydralazine then.  The last 2 mornings he has not taken any medication because his BP has been around 98/58.  Pt is aware I will forward this information to Dr Tamala Julian for review and any further recommendations.  He was advised to continue to monitor his BP and take meds as instructed.  Pt is aware he will be called back with any new instructions/orders.

## 2015-02-28 NOTE — Telephone Encounter (Signed)
Follow up ° ° ° ° ° °Returning [name]'s call °

## 2015-03-02 NOTE — Anesthesia Preprocedure Evaluation (Addendum)
Anesthesia Evaluation  Patient identified by MRN, date of birth, ID band Patient awake    Reviewed: Allergy & Precautions, H&P , NPO status , Patient's Chart, lab work & pertinent test results, reviewed documented beta blocker date and time   History of Anesthesia Complications Negative for: history of anesthetic complications  Airway Mallampati: III  TM Distance: >3 FB Neck ROM: Full    Dental  (+) Edentulous Upper, Edentulous Lower, Dental Advisory Given   Pulmonary sleep apnea and Continuous Positive Airway Pressure Ventilation , COPDCurrent Smoker, former smoker,  breath sounds clear to auscultation        Cardiovascular hypertension, Pt. on medications and Pt. on home beta blockers + CAD, + Past MI, + CABG (times six 1999), + Peripheral Vascular Disease and +CHF + dysrhythmias Supra Ventricular Tachycardia Rhythm:Regular Rate:Normal  Echo 08/2014 - Left ventricle: The cavity size was normal. Systolic function wasmildly reduced. The estimated ejection fraction was in the rangeof 45% to 50%. There is akinesis of the inferior myocardium.Features are consistent with a pseudonormal left ventricularfilling pattern, with concomitant abnormal relaxation andincreased filling pressure (grade 2 diastolic dysfunction).  Bifascicular block on ECG - Mitral valve: There was mild regurgitation.    Neuro/Psych  Headaches, PSYCHIATRIC DISORDERS S/p CEA. 50 - 60 % on other side    GI/Hepatic Neg liver ROS, hiatal hernia, GERD-  Medicated and Controlled,  Endo/Other  diabetes, Well Controlled, Type 2, Oral Hypoglycemic Agents, Insulin Dependent  Renal/GU Renal InsufficiencyRenal diseaseBaseline creatinine 1.86     Musculoskeletal  (+) Arthritis -,   Abdominal   Peds  Hematology  (+) anemia ,   Anesthesia Other Findings   Reproductive/Obstetrics                          Anesthesia Physical Anesthesia  Plan  ASA: III  Anesthesia Plan: General   Post-op Pain Management:    Induction: Intravenous  Airway Management Planned: LMA  Additional Equipment:   Intra-op Plan:   Post-operative Plan: Extubation in OR  Informed Consent:   Plan Discussed with: Surgeon  Anesthesia Plan Comments:         Anesthesia Quick Evaluation

## 2015-03-03 ENCOUNTER — Encounter (HOSPITAL_COMMUNITY)
Admission: RE | Disposition: A | Discharge status: Home / Self Care | Payer: Self-pay | Source: Ambulatory Visit | Attending: Urology

## 2015-03-03 ENCOUNTER — Ambulatory Visit (HOSPITAL_COMMUNITY): Payer: Medicare Other | Admitting: Anesthesiology

## 2015-03-03 ENCOUNTER — Ambulatory Visit (HOSPITAL_COMMUNITY)
Admission: RE | Admit: 2015-03-03 | Discharge: 2015-03-03 | Disposition: A | Discharge status: Home / Self Care | Payer: Medicare Other | Source: Ambulatory Visit | Attending: Urology | Admitting: Urology

## 2015-03-03 ENCOUNTER — Encounter (HOSPITAL_COMMUNITY): Payer: Self-pay | Admitting: Anesthesiology

## 2015-03-03 DIAGNOSIS — Z87891 Personal history of nicotine dependence: Secondary | ICD-10-CM | POA: Insufficient documentation

## 2015-03-03 DIAGNOSIS — J449 Chronic obstructive pulmonary disease, unspecified: Secondary | ICD-10-CM | POA: Insufficient documentation

## 2015-03-03 DIAGNOSIS — E119 Type 2 diabetes mellitus without complications: Secondary | ICD-10-CM | POA: Insufficient documentation

## 2015-03-03 DIAGNOSIS — Z7982 Long term (current) use of aspirin: Secondary | ICD-10-CM | POA: Diagnosis not present

## 2015-03-03 DIAGNOSIS — K449 Diaphragmatic hernia without obstruction or gangrene: Secondary | ICD-10-CM | POA: Insufficient documentation

## 2015-03-03 DIAGNOSIS — I129 Hypertensive chronic kidney disease with stage 1 through stage 4 chronic kidney disease, or unspecified chronic kidney disease: Secondary | ICD-10-CM | POA: Diagnosis not present

## 2015-03-03 DIAGNOSIS — R338 Other retention of urine: Secondary | ICD-10-CM | POA: Diagnosis not present

## 2015-03-03 DIAGNOSIS — C679 Malignant neoplasm of bladder, unspecified: Secondary | ICD-10-CM | POA: Diagnosis not present

## 2015-03-03 DIAGNOSIS — N403 Nodular prostate with lower urinary tract symptoms: Secondary | ICD-10-CM | POA: Insufficient documentation

## 2015-03-03 DIAGNOSIS — N183 Chronic kidney disease, stage 3 (moderate): Secondary | ICD-10-CM | POA: Diagnosis not present

## 2015-03-03 DIAGNOSIS — Z87440 Personal history of urinary (tract) infections: Secondary | ICD-10-CM | POA: Insufficient documentation

## 2015-03-03 DIAGNOSIS — I252 Old myocardial infarction: Secondary | ICD-10-CM | POA: Insufficient documentation

## 2015-03-03 DIAGNOSIS — D494 Neoplasm of unspecified behavior of bladder: Secondary | ICD-10-CM

## 2015-03-03 DIAGNOSIS — K219 Gastro-esophageal reflux disease without esophagitis: Secondary | ICD-10-CM | POA: Insufficient documentation

## 2015-03-03 DIAGNOSIS — Z955 Presence of coronary angioplasty implant and graft: Secondary | ICD-10-CM | POA: Diagnosis not present

## 2015-03-03 DIAGNOSIS — N329 Bladder disorder, unspecified: Secondary | ICD-10-CM | POA: Diagnosis not present

## 2015-03-03 DIAGNOSIS — Z794 Long term (current) use of insulin: Secondary | ICD-10-CM | POA: Insufficient documentation

## 2015-03-03 DIAGNOSIS — C674 Malignant neoplasm of posterior wall of bladder: Secondary | ICD-10-CM | POA: Insufficient documentation

## 2015-03-03 DIAGNOSIS — I5032 Chronic diastolic (congestive) heart failure: Secondary | ICD-10-CM | POA: Insufficient documentation

## 2015-03-03 DIAGNOSIS — N3289 Other specified disorders of bladder: Secondary | ICD-10-CM | POA: Diagnosis not present

## 2015-03-03 DIAGNOSIS — R3914 Feeling of incomplete bladder emptying: Secondary | ICD-10-CM | POA: Insufficient documentation

## 2015-03-03 DIAGNOSIS — N323 Diverticulum of bladder: Secondary | ICD-10-CM | POA: Diagnosis not present

## 2015-03-03 DIAGNOSIS — Z7902 Long term (current) use of antithrombotics/antiplatelets: Secondary | ICD-10-CM | POA: Insufficient documentation

## 2015-03-03 DIAGNOSIS — I2510 Atherosclerotic heart disease of native coronary artery without angina pectoris: Secondary | ICD-10-CM | POA: Insufficient documentation

## 2015-03-03 DIAGNOSIS — I739 Peripheral vascular disease, unspecified: Secondary | ICD-10-CM | POA: Insufficient documentation

## 2015-03-03 DIAGNOSIS — M1990 Unspecified osteoarthritis, unspecified site: Secondary | ICD-10-CM | POA: Insufficient documentation

## 2015-03-03 DIAGNOSIS — G4733 Obstructive sleep apnea (adult) (pediatric): Secondary | ICD-10-CM | POA: Diagnosis not present

## 2015-03-03 DIAGNOSIS — E780 Pure hypercholesterolemia: Secondary | ICD-10-CM | POA: Diagnosis not present

## 2015-03-03 DIAGNOSIS — C678 Malignant neoplasm of overlapping sites of bladder: Secondary | ICD-10-CM | POA: Diagnosis not present

## 2015-03-03 DIAGNOSIS — D649 Anemia, unspecified: Secondary | ICD-10-CM | POA: Insufficient documentation

## 2015-03-03 HISTORY — PX: CYSTOSCOPY W/ RETROGRADES: SHX1426

## 2015-03-03 HISTORY — PX: TRANSURETHRAL RESECTION OF BLADDER TUMOR WITH GYRUS (TURBT-GYRUS): SHX6458

## 2015-03-03 LAB — GLUCOSE, CAPILLARY
Glucose-Capillary: 208 mg/dL — ABNORMAL HIGH (ref 65–99)
Glucose-Capillary: 179 mg/dL — ABNORMAL HIGH (ref 65–99)

## 2015-03-03 SURGERY — CYSTOSCOPY, WITH RETROGRADE PYELOGRAM
Anesthesia: General

## 2015-03-03 MED ORDER — OXYBUTYNIN CHLORIDE 5 MG PO TABS
ORAL_TABLET | ORAL | Status: AC
  Filled 2015-03-03: qty 1

## 2015-03-03 MED ORDER — FENTANYL CITRATE (PF) 100 MCG/2ML IJ SOLN: 25.0000 ug | INTRAMUSCULAR | Status: DC | PRN

## 2015-03-03 MED ORDER — EPHEDRINE SULFATE 50 MG/ML IJ SOLN
INTRAMUSCULAR | Status: AC
  Filled 2015-03-03: qty 1

## 2015-03-03 MED ORDER — ONDANSETRON HCL 4 MG/2ML IJ SOLN
INTRAMUSCULAR | Status: DC | PRN
  Administered 2015-03-03: 4 mg via INTRAVENOUS

## 2015-03-03 MED ORDER — OXYBUTYNIN CHLORIDE 5 MG PO TABS
5.0000 mg | ORAL_TABLET | Freq: Once | ORAL | Status: AC
  Administered 2015-03-03: 5 mg via ORAL

## 2015-03-03 MED ORDER — SODIUM CHLORIDE 0.9 % IR SOLN
Status: DC | PRN
  Administered 2015-03-03: 24000 mL

## 2015-03-03 MED ORDER — ONDANSETRON HCL 4 MG/2ML IJ SOLN
INTRAMUSCULAR | Status: AC
  Filled 2015-03-03: qty 2

## 2015-03-03 MED ORDER — LEVOFLOXACIN IN D5W 500 MG/100ML IV SOLN
500.0000 mg | INTRAVENOUS | Status: AC
  Administered 2015-03-03: 500 mg via INTRAVENOUS
  Filled 2015-03-03: qty 100

## 2015-03-03 MED ORDER — FENTANYL CITRATE (PF) 100 MCG/2ML IJ SOLN
INTRAMUSCULAR | Status: DC | PRN
  Administered 2015-03-03 (×4): 25 ug via INTRAVENOUS

## 2015-03-03 MED ORDER — PHENAZOPYRIDINE HCL 200 MG PO TABS
200.0000 mg | ORAL_TABLET | Freq: Once | ORAL | Status: AC
  Administered 2015-03-03: 200 mg via ORAL

## 2015-03-03 MED ORDER — LIDOCAINE HCL (CARDIAC) 20 MG/ML IV SOLN
INTRAVENOUS | Status: DC | PRN
  Administered 2015-03-03: 50 mg via INTRAVENOUS

## 2015-03-03 MED ORDER — PROPOFOL 10 MG/ML IV BOLUS
INTRAVENOUS | Status: AC
  Filled 2015-03-03: qty 20

## 2015-03-03 MED ORDER — ONDANSETRON HCL 4 MG/2ML IJ SOLN
INTRAMUSCULAR | Status: AC
Start: 2015-03-03 — End: 2015-03-03
  Filled 2015-03-03: qty 2

## 2015-03-03 MED ORDER — PHENAZOPYRIDINE HCL 200 MG PO TABS: 200.0000 mg | ORAL_TABLET | Freq: Three times a day (TID) | ORAL | Status: DC | PRN

## 2015-03-03 MED ORDER — PROPOFOL 10 MG/ML IV BOLUS
INTRAVENOUS | Status: DC | PRN
  Administered 2015-03-03: 150 mg via INTRAVENOUS

## 2015-03-03 MED ORDER — 0.9 % SODIUM CHLORIDE (POUR BTL) OPTIME
TOPICAL | Status: DC | PRN
  Administered 2015-03-03: 1000 mL

## 2015-03-03 MED ORDER — ACETAMINOPHEN 10 MG/ML IV SOLN
1000.0000 mg | Freq: Once | INTRAVENOUS | Status: AC
  Administered 2015-03-03: 1000 mg via INTRAVENOUS
  Filled 2015-03-03: qty 100

## 2015-03-03 MED ORDER — LACTATED RINGERS IV SOLN: INTRAVENOUS | Status: DC

## 2015-03-03 MED ORDER — PHENAZOPYRIDINE HCL 200 MG PO TABS
ORAL_TABLET | ORAL | Status: AC
  Filled 2015-03-03: qty 1

## 2015-03-03 MED ORDER — OXYCODONE HCL 10 MG PO TABS: 10.0000 mg | ORAL_TABLET | ORAL | Status: DC | PRN

## 2015-03-03 MED ORDER — EPHEDRINE SULFATE 50 MG/ML IJ SOLN
INTRAMUSCULAR | Status: DC | PRN
  Administered 2015-03-03 (×2): 5 mg via INTRAVENOUS

## 2015-03-03 MED ORDER — IOHEXOL 300 MG/ML  SOLN
INTRAMUSCULAR | Status: DC | PRN
  Administered 2015-03-03: 10 mL

## 2015-03-03 MED ORDER — SODIUM CHLORIDE 0.9 % IJ SOLN
INTRAMUSCULAR | Status: AC
  Filled 2015-03-03: qty 10

## 2015-03-03 MED ORDER — LIDOCAINE HCL (CARDIAC) 20 MG/ML IV SOLN
INTRAVENOUS | Status: AC
  Filled 2015-03-03: qty 5

## 2015-03-03 MED ORDER — LACTATED RINGERS IV SOLN
INTRAVENOUS | Status: DC | PRN
  Administered 2015-03-03: 07:00:00 via INTRAVENOUS

## 2015-03-03 MED ORDER — FENTANYL CITRATE (PF) 100 MCG/2ML IJ SOLN
INTRAMUSCULAR | Status: AC
  Filled 2015-03-03: qty 2

## 2015-03-03 SURGICAL SUPPLY — 19 items
BAG URINE DRAINAGE (UROLOGICAL SUPPLIES) ×1 IMPLANT
BAG URO CATCHER STRL LF (DRAPE) ×3 IMPLANT
CATH FOLEY 2WAY SLVR  5CC 20FR (CATHETERS) ×1
CATH FOLEY 2WAY SLVR 5CC 20FR (CATHETERS) IMPLANT
DRAPE CAMERA CLOSED 9X96 (DRAPES) ×2 IMPLANT
ELECT LOOP 22F BIPOLAR SML (ELECTROSURGICAL) ×3
ELECTRODE LOOP 22F BIPOLAR SML (ELECTROSURGICAL) IMPLANT
EVACUATOR MICROVAS BLADDER (UROLOGICAL SUPPLIES) ×1 IMPLANT
GLOVE BIOGEL M 8.0 STRL (GLOVE) ×3 IMPLANT
GOWN STRL REUS W/TWL XL LVL3 (GOWN DISPOSABLE) ×3 IMPLANT
GUIDEWIRE STR DUAL SENSOR (WIRE) ×1 IMPLANT
HOLDER FOLEY CATH W/STRAP (MISCELLANEOUS) IMPLANT
KIT ASPIRATION TUBING (SET/KITS/TRAYS/PACK) ×3 IMPLANT
LOOP CUT BIPOLAR 24F LRG (ELECTROSURGICAL) ×2 IMPLANT
MANIFOLD NEPTUNE II (INSTRUMENTS) ×3 IMPLANT
NS IRRIG 1000ML POUR BTL (IV SOLUTION) ×3 IMPLANT
PACK CYSTO (CUSTOM PROCEDURE TRAY) ×3 IMPLANT
SYRINGE IRR TOOMEY STRL 70CC (SYRINGE) ×1 IMPLANT
TUBING CONNECTING 10 (TUBING) ×3 IMPLANT

## 2015-03-03 NOTE — Telephone Encounter (Signed)
Called to give pt Dr.Smith's recommendation. lmtcb x2

## 2015-03-03 NOTE — Op Note (Signed)
PATIENT:  [name]  PRE-OPERATIVE DIAGNOSIS: 1. Bladder tumor 2. Bladder diverticulum  POST-OPERATIVE DIAGNOSIS: Same  PROCEDURE:  Procedure(s): TRANSURETHRAL RESECTION OF BLADDER TUMOR (TURBT) (6cm.)  SURGEON:  Surgeon(s): Claybon Jabs  ANESTHESIA:   General  EBL:  Minimal  DRAINS: Urinary Catheter (20 Fr. Foley)   SPECIMEN:  Source of Specimen:  Bladder tumor  DISPOSITION OF SPECIMEN:  PATHOLOGY  Indication: [name] is a 74 year old male who recently underwent an ultrasound that revealed no evidence of hydronephrosis or renal mass but was reported to show posterior bladder wall thickening that was felt to possibly be secondary to inflammation versus neoplastic disease. Cystoscopy revealed extensive bladder tumor involving the floor of the bladder primarily on the right-hand side from the midline on up the wall of the bladder about half way and extending in to a bladder diverticulum on the right floor of the bladder as well as extending onto the posterior wall in 1 contiguous sheet of neoplastic-appearing mucosa. He is brought to the operating room today for resection and bilateral retrograde pyelography. Preoperative urine culture was obtained, found to be positive and he was placed on appropriate antibiotics which he has been maintained on up until the date of surgery. It was sensitive to Levaquin as well which she received intravenously preoperatively.  Description of operation: The patient was taken to the operating room and administered general anesthesia. He was then placed on the table and moved to the dorsal lithotomy position after which his genitalia was sterilely prepped and draped. An official timeout was then performed.  Initially I passed the 80 French cystoscope with 30 lens down the urethra which was noted be normal. The prostatic urethra revealed no lesions with mild bilobar hypertrophy. Upon entering the bladder I did a full and systematic inspection with  both the 30 and 70 lenses. I again identified the tumor as described above. There were several areas of nodular/papillary growth especially involving the area of the diverticulum. I was able to identify the left ureteral orifice and proceeded with a left retrograde pyelogram.  Left retrograde pyelography was performed by passing a 6 [place] open-ended ureteral catheter through the cystoscope and into the left ureteral orifice. I then injected full-strength contrast under direct fluoroscopy and noted the ureter to be entirely normal throughout its course with a normal-appearing intrarenal collecting system.  Extensive searching for the right ureteral orifice was unsuccessful. It appeared the diverticulum may have somehow involved the ureteral orifice but the corresponding location of the right ureteral orifice, based on the location of the left ureteral orifice, would have placed it within the neoplastic process at about the 4-5:00 position of the diverticulum. The cystoscope was therefore removed.  The 10 French resectoscope with the 30 lens was then passed into the bladder..   I first began by resecting the tumor from the posterior wall along its most medial border out to the level of the bladder neck. This proceeded in a systematic. Fashion resecting the tumor from medial to lateral. I was unable to identify the ureteral orifice at any time but judicious use of electrocautery was used near the area of the anticipated ureteral orifice. I resected the tumor from the right wall of the bladder and it appeared to extend to the level of the bladder neck at about the 9:00 position.  I then used extreme caution and was able to resect a lot of the nodular growth within the diverticulum without perforation or identification of any fat. I was able  to resect a lot of the tumor but was unable to resect all the tumor from within the diverticulum due to fear of perforation and the need to fully stage the tumor as a  cystectomy may be a consideration. I was able to resect virtually all of the visible tumor involving the bladder wall on up the bladder wall although there may have been some still present above the area of resection on the right-hand wall.  The Microvasive evacuator was then used to irrigate the bladder and remove all of the portions of bladder tumor which were sent to pathology. Reinspection revealed no evidence of perforation. I then removed the resectoscope.  A 20 French Foley catheter was then inserted in the bladder and irrigated. The irrigant returned clear with no clots. The patient was awakened and taken to the recovery room.  Mitomycin-C was not instilled postoperatively due to the extensive area of resection including resection in the diverticulum. In addition it did not appear that all of the tumor had been resected.  PLAN OF CARE: Discharge to home after PACU  PATIENT DISPOSITION:  PACU - hemodynamically stable.

## 2015-03-03 NOTE — Discharge Instructions (Signed)
Transurethral Resection of Bladder Tumor (TURBT)   Definition:  Transurethral Resection of the Bladder Tumor is a surgical procedure used to diagnose and remove tumors within the bladder. TURBT is the most common treatment for early stage bladder cancer.  General instructions:     Your recent bladder surgery requires very little post hospital care but some definite precautions.  Despite the fact that no skin incisions were used, the area around the bladder incisions are raw and covered with scabs to promote healing and prevent bleeding. Certain precautions are needed to insure that the scabs are not disturbed over the next 2-4 weeks while the healing proceeds.  Because the raw surface inside your bladder and the irritating effects of urine you may expect frequency of urination and/or urgency (a stronger desire to urinate) and perhaps even getting up at night more often. This will usually resolve or improve slowly over the healing period. You may see some blood in your urine over the first 6 weeks. Do not be alarmed, even if the urine was clear for a while. Get off your feet and drink lots of fluids until clearing occurs. If you start to pass clots or don't improve call us.  Catheter: (If you are discharged with a catheter.)  1. Keep your catheter secured to your leg at all times with tape or the supplied strap. 2. You may experience leakage of urine around your catheter- as long as the  catheter continues to drain, this is normal.  If your catheter stops draining  go to the ER. 3. You may also have blood in your urine, even after it has been clear for  several days; you may even pass some small blood clots or other material.  This  is normal as well.  If this happens, sit down and drink plenty of water to help  make urine to flush out your bladder.  If the blood in your urine becomes worse  after doing this, contact our office or return to the ER. 4. You may use the leg bag (small bag)  during the day, but use the large bag at  night.  Diet:  You may return to your normal diet immediately. Because of the raw surface of your bladder, alcohol, spicy foods, foods high in acid and drinks with caffeine may cause irritation or frequency and should be used in moderation. To keep your urine flowing freely and avoid constipation, drink plenty of fluids during the day (8-10 glasses). Tip: Avoid cranberry juice because it is very acidic.  Activity:  Your physical activity doesn't need to be restricted. However, if you are very active, you may see some blood in the urine. We suggest that you reduce your activity under the circumstances until the bleeding has stopped.  Bowels:  It is important to keep your bowels regular during the postoperative period. Straining with bowel movements can cause bleeding. A bowel movement every other day is reasonable. Use a mild laxative if needed, such as milk of magnesia 2-3 tablespoons, or 2 Dulcolax tablets. Call if you continue to have problems. If you had been taking narcotics for pain, before, during or after your surgery, you may be constipated. Take a laxative if necessary.    Medication:  Hold Plavix until the urine has cleared completely. If it is not clear within 48 hours please contact my office for further instructions.  You should resume your pre-surgery medications unless told not to. In addition you may be given an antibiotic to prevent  or treat infection. Antibiotics are not always necessary. All medication should be taken as prescribed until the bottles are finished unless you are having an unusual reaction to one of the drugs.

## 2015-03-03 NOTE — Transfer of Care (Signed)
Immediate Anesthesia Transfer of Care Note  Patient: [name]  Procedure(s) Performed: Procedure(s): CYSTOSCOPY WITH LEFT RETROGRADE PYELOGRAM (Bilateral) TRANSURETHRAL RESECTION OF BLADDER TUMOR (N/A)  Patient Location: PACU  Anesthesia Type:General  Level of Consciousness: awake, alert  and oriented  Airway & Oxygen Therapy: Patient Spontanous Breathing and Patient connected to face mask oxygen  Post-op Assessment: Report given to RN and Post -op Vital signs reviewed and stable  Post vital signs: Reviewed and stable  Last Vitals:  Filed Vitals:   03/03/15 0535  BP: 147/69  Pulse: 54  Temp: 36.3 C  Resp: 18    Complications: No apparent anesthesia complications

## 2015-03-03 NOTE — H&P (Signed)
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 He was not a surgical candidate at that time so I placed him on medical management after I first saw him and noted his creatinine improved slightly and since then he says his urination has remained stable. He did see some improvement after starting the medication. Cystoscopy in 10/13 revealed no significant prostatic obstruction.      Prostate nodule: He has a nodule in the apex of the prostate on the left side that has remained unchanged over the years. In addition his PSA has been found to be completely normal and stable as well.    Gross hematuria: He was experiencing mild dysuria and was placed on empiric antibiotics with resolution of his hematuria. In 4/13 he underwent a full hematuria workup with CT scan and cystoscopy which were found to be negative. At that time my feeling was that his hematuria was secondary to a UTI.     Incomplete bladder emptying/urinary retention: He was experiencing dysuria, frequency and lower abdominal pain and was found to have a PVR of 468 cc. A Foley catheter was inserted.  Urodynamics 10/13: He was found to have a hypotonic, large capacity bladder with no significant evidence of outlet obstruction.  Treatment: CIC 4 times a day and prior to bed and tamsulosin. (Catheter requirement #200/month)     Interval history: He has been doing well since I have seen him last overall. He has not had any hematuria. He continues to perform self-catheterization without difficulty. He recently was told that he had a UTI by his nephrologist and was treated with antibiotics. Renal ultrasound done on 12/25/14 revealed no evidence of hydronephrosis or renal mass. It was noted there appeared to be some posterior bladder wall thickening that may have been secondary to inflammation versus  neoplastic disease. This was done when he was being treated for a UTI.   Past Medical History Problems  1. History of Acute Myocardial Infarction 2. History of Arthritis 3. History of Coagulation Defects 4. History of diabetes mellitus (Z86.39) 5. History of heartburn (Z87.898) 6. History of hypercholesterolemia (Z86.39) 7. History of hypertension (Z86.79)  Surgical History Problems  1. History of Ankle Repair 2. History of Coronary Artery Triple Arterial Bypass Graft 3. History of Laminectomy Cervical  Current Meds 1. Acetaminophen 325 MG Oral Tablet;  Therapy: (Recorded:30Mar2015) to Recorded 2. AmLODIPine Besylate 10 MG Oral Tablet;  Therapy: (Recorded:25Apr2013) to Recorded 3. Aspirin EC Low Dose 81 MG Oral Tablet Delayed Release;  Therapy: (Recorded:29Mar2016) to Recorded 4. Atorvastatin Calcium 20 MG Oral Tablet;  Therapy: (Recorded:04Nov2015) to Recorded 5. Furosemide 20 MG Oral Tablet;  Therapy: (Recorded:04Nov2015) to Recorded 6. GlipiZIDE XL 10 MG Oral Tablet Extended Release 24 Hour;  Therapy: (Recorded:04Nov2015) to Recorded 7. HydrALAZINE HCl - 50 MG Oral Tablet;  Therapy: (Recorded:29Mar2016) to Recorded 8. Irbesartan 300 MG Oral Tablet;  Therapy: (Recorded:04Nov2015) to Recorded 9. Isosorbide Mononitrate 120 MG TBCR;  Therapy: (Recorded:29Mar2016) to Recorded 10. Lantus SoloStar 100 UNIT/ML SOLN;   Therapy: (Recorded:29Mar2016) to Recorded 11. Metoprolol Succinate ER 50 MG Oral Tablet Extended Release 24 Hour;   Therapy: (Recorded:25Apr2013) to Recorded 12. Multi Vitamin/Minerals TABS;   Therapy: (Recorded:29Mar2016) to Recorded 13. Nitrostat 0.4 MG Sublingual Tablet Sublingual;   Therapy: (Recorded:25Apr2013) to Recorded 14. Pantoprazole Sodium 40 MG Oral Tablet Delayed Release;  Therapy: (Recorded:29Mar2016) to Recorded 15. Plavix 75 MG Oral Tablet;   Therapy: (Recorded:29Mar2016) to Recorded 16. Torsemide 20 MG Oral Tablet;   Therapy:  (Recorded:29Mar2016) to Recorded 17. Vitamin D 1000 UNIT Oral Tablet;   Therapy: (Recorded:25Apr2013) to Recorded  Allergies Medication  1. No Known Drug Allergies  Social History Problems  1. Caffeine Use   2 2. Former smoker [phone]) 3. History of Tobacco Use   quit; smoked two packs per day for 50 years  Review of Systems 13 point review of systems was essentially negative  Genitourinary and gastrointestinal system(s) were reviewed and pertinent findings if present are noted and are otherwise negative.  Genitourinary: no hematuria.    Vitals Vital Signs  Blood Pressure: 129 / 40 Temperature: 97.3 F Heart Rate: 53  PHYSICAL EXAMINATION:   Generally, the patient is welldeveloped, wellnourished white male in no apparent distress.  HEENT:  Normocephalic and atraumatic.  Oropharynx is clear.  Neck is supple with midline trachea.  Chest reveals normal respiratory effort.  Cardiovascular regular rate and rhythm.  Abdomen is soft and nontender without mass or hepatosplenomegaly.  He has inguinal herniae or adenopathy.  He has normal external male genitalia without lesions.  He has a normal phallus, glans, meatus and scrotum.  Testicles are normal and descended bilaterally, equal in size and consistency.  He has a normal anus and perineum.  Rectal examination:  His prostate is 1+ enlarged, smooth and symmetric.  On the right hand side, there is a slightly raised nodularity, but a little bit firmer than the rest of the prostate.  No fixation or extension of the disease outside of the prostate is noted.  His skin is warm and dry.  Extremities without cyanosis, clubbing or edema.  He alert and oriented with appropriate mood and affect.  He has no gross focal neurologic deficit.        Procedure  Procedure: Cystoscopy was performed on 01/14/15  Indication: Bladder Mass.  Informed Consent: Risks, benefits, and potential adverse events were discussed and informed consent was obtained from  the patient.  Prep: The patient was prepped with betadine.  Anesthesia:. Local anesthesia was administered intraurethrally with 2% lidocaine jelly.  Procedure Note:  Urethral meatus:. No abnormalities.  Anterior urethra: No abnormalities.  Prostatic urethra: No abnormalities.  Bladder: Visulization was clear. The ureteral orifices were in the normal anatomic position bilaterally and had clear efflux of urine. (Extensive papillary lesions were noted on the floor of the bladder involving the diverticulum and inside the diverticulum) The patient tolerated the procedure well.  Complications: None.    Assessment   His renal ultrasound showed an abnormality of the bladder that looked somewhat suspicious so I performed cystoscopy today and found extensive papillary lesions on the floor of the bladder involving the diverticulum as well. 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 He has renal insufficiency and needs upper tract evaluation as well.  I have discussed with him proceeding with transurethral resection of his bladder tumor. I have gone over the procedure with him in detail including its risks and complications, the outpatient nature of the procedure as well as the anticipated postoperative course. We also discussed the possible placement of intravesical mitomycin-C post-procedurally. In addition I need to evaluate his upper tracts and therefore will perform bilateral retrograde pyelograms as well. He understands and is elected to proceed.    He did have some pyuria today. Because of his upcoming  surgery I am going to culture his urine. He also is on Plavix and has been off of it previously. I am going to check with his cardiologist to make sure its okay to stop his Plavix prior to the surgery.   Plan   1. Transurethral resection of his bladder tumors as well as bilateral retrograde pyelograms.  2. He'll stop his Plavix 1  week prior to procedure if okayed by cardiology.

## 2015-03-03 NOTE — Anesthesia Postprocedure Evaluation (Signed)
  Anesthesia Post-op Note  Patient: [name]  Procedure(s) Performed: Procedure(s) (LRB): CYSTOSCOPY WITH LEFT RETROGRADE PYELOGRAM (Bilateral) TRANSURETHRAL RESECTION OF BLADDER TUMOR (N/A)  Patient Location: PACU  Anesthesia Type: General  Level of Consciousness: awake and alert   Airway and Oxygen Therapy: Patient Spontanous Breathing  Post-op Pain: mild  Post-op Assessment: Post-op Vital signs reviewed, Patient's Cardiovascular Status Stable, Respiratory Function Stable, Patent Airway and No signs of Nausea or vomiting  Last Vitals:  Filed Vitals:   03/03/15 1023  BP: 124/68  Pulse: 58  Temp:   Resp: 16    Post-op Vital Signs: stable   Complications: No apparent anesthesia complications

## 2015-03-03 NOTE — Telephone Encounter (Signed)
Follow up ° ° ° ° ° °Returning Lisa's call °

## 2015-03-03 NOTE — Telephone Encounter (Signed)
Pt aware of Dr.Smith's recommendations. Continue careful monitoring and using hydralazine as needed if SBP > 150 mmHg. Should not use hydralazine more than twice daily. Pt verbalized understanding.

## 2015-03-03 NOTE — Anesthesia Procedure Notes (Signed)
Procedure Name: LMA Insertion Date/Time: 03/03/2015 7:22 AM Performed by: Danley Danker L Patient Re-evaluated:Patient Re-evaluated prior to inductionOxygen Delivery Method: Circle system utilized Preoxygenation: Pre-oxygenation with 100% oxygen Intubation Type: IV induction Ventilation: Mask ventilation without difficulty LMA: LMA inserted LMA Size: 4.0 Number of attempts: 1 Placement Confirmation: positive ETCO2 and breath sounds checked- equal and bilateral Tube secured with: Tape Dental Injury: Teeth and Oropharynx as per pre-operative assessment

## 2015-03-04 ENCOUNTER — Encounter (HOSPITAL_COMMUNITY): Payer: Self-pay | Admitting: Urology

## 2015-03-07 ENCOUNTER — Emergency Department (HOSPITAL_COMMUNITY)
Admission: EM | Admit: 2015-03-07 | Discharge: 2015-03-07 | Disposition: A | Discharge status: Home / Self Care | Payer: Medicare Other | Attending: Emergency Medicine | Admitting: Emergency Medicine

## 2015-03-07 ENCOUNTER — Encounter (HOSPITAL_COMMUNITY): Payer: Self-pay | Admitting: *Deleted

## 2015-03-07 DIAGNOSIS — Z794 Long term (current) use of insulin: Secondary | ICD-10-CM | POA: Diagnosis not present

## 2015-03-07 DIAGNOSIS — E119 Type 2 diabetes mellitus without complications: Secondary | ICD-10-CM | POA: Diagnosis not present

## 2015-03-07 DIAGNOSIS — Z9861 Coronary angioplasty status: Secondary | ICD-10-CM | POA: Insufficient documentation

## 2015-03-07 DIAGNOSIS — N183 Chronic kidney disease, stage 3 (moderate): Secondary | ICD-10-CM | POA: Diagnosis not present

## 2015-03-07 DIAGNOSIS — T83098A Other mechanical complication of other indwelling urethral catheter, initial encounter: Secondary | ICD-10-CM | POA: Insufficient documentation

## 2015-03-07 DIAGNOSIS — Z9889 Other specified postprocedural states: Secondary | ICD-10-CM | POA: Diagnosis not present

## 2015-03-07 DIAGNOSIS — M1990 Unspecified osteoarthritis, unspecified site: Secondary | ICD-10-CM | POA: Insufficient documentation

## 2015-03-07 DIAGNOSIS — Z87891 Personal history of nicotine dependence: Secondary | ICD-10-CM | POA: Diagnosis not present

## 2015-03-07 DIAGNOSIS — Y846 Urinary catheterization as the cause of abnormal reaction of the patient, or of later complication, without mention of misadventure at the time of the procedure: Secondary | ICD-10-CM | POA: Diagnosis not present

## 2015-03-07 DIAGNOSIS — E785 Hyperlipidemia, unspecified: Secondary | ICD-10-CM | POA: Diagnosis not present

## 2015-03-07 DIAGNOSIS — D649 Anemia, unspecified: Secondary | ICD-10-CM | POA: Insufficient documentation

## 2015-03-07 DIAGNOSIS — I129 Hypertensive chronic kidney disease with stage 1 through stage 4 chronic kidney disease, or unspecified chronic kidney disease: Secondary | ICD-10-CM | POA: Diagnosis not present

## 2015-03-07 DIAGNOSIS — I4891 Unspecified atrial fibrillation: Secondary | ICD-10-CM | POA: Insufficient documentation

## 2015-03-07 DIAGNOSIS — Z7902 Long term (current) use of antithrombotics/antiplatelets: Secondary | ICD-10-CM | POA: Diagnosis not present

## 2015-03-07 DIAGNOSIS — Z85828 Personal history of other malignant neoplasm of skin: Secondary | ICD-10-CM | POA: Insufficient documentation

## 2015-03-07 DIAGNOSIS — Z79899 Other long term (current) drug therapy: Secondary | ICD-10-CM | POA: Insufficient documentation

## 2015-03-07 DIAGNOSIS — K219 Gastro-esophageal reflux disease without esophagitis: Secondary | ICD-10-CM | POA: Diagnosis not present

## 2015-03-07 DIAGNOSIS — I5042 Chronic combined systolic (congestive) and diastolic (congestive) heart failure: Secondary | ICD-10-CM | POA: Insufficient documentation

## 2015-03-07 DIAGNOSIS — Z951 Presence of aortocoronary bypass graft: Secondary | ICD-10-CM | POA: Diagnosis not present

## 2015-03-07 DIAGNOSIS — Z7982 Long term (current) use of aspirin: Secondary | ICD-10-CM | POA: Diagnosis not present

## 2015-03-07 DIAGNOSIS — I2510 Atherosclerotic heart disease of native coronary artery without angina pectoris: Secondary | ICD-10-CM | POA: Diagnosis not present

## 2015-03-07 DIAGNOSIS — Z87440 Personal history of urinary (tract) infections: Secondary | ICD-10-CM | POA: Diagnosis not present

## 2015-03-07 DIAGNOSIS — I471 Supraventricular tachycardia: Secondary | ICD-10-CM | POA: Diagnosis not present

## 2015-03-07 DIAGNOSIS — T839XXA Unspecified complication of genitourinary prosthetic device, implant and graft, initial encounter: Secondary | ICD-10-CM

## 2015-03-07 DIAGNOSIS — I252 Old myocardial infarction: Secondary | ICD-10-CM | POA: Diagnosis not present

## 2015-03-07 DIAGNOSIS — G8929 Other chronic pain: Secondary | ICD-10-CM | POA: Diagnosis not present

## 2015-03-07 DIAGNOSIS — J449 Chronic obstructive pulmonary disease, unspecified: Secondary | ICD-10-CM | POA: Insufficient documentation

## 2015-03-07 DIAGNOSIS — R339 Retention of urine, unspecified: Secondary | ICD-10-CM | POA: Diagnosis present

## 2015-03-07 LAB — URINALYSIS, ROUTINE W REFLEX MICROSCOPIC
GLUCOSE, UA: NEGATIVE mg/dL
Ketones, ur: NEGATIVE mg/dL
NITRITE: POSITIVE — AB
Protein, ur: 100 mg/dL — AB
SPECIFIC GRAVITY, URINE: 1.013 (ref 1.005–1.030)
Urobilinogen, UA: 1 mg/dL (ref 0.0–1.0)
pH: 5 (ref 5.0–8.0)

## 2015-03-07 LAB — URINE MICROSCOPIC-ADD ON

## 2015-03-07 NOTE — ED Notes (Signed)
Catether irrigated with 10 ml of N/S, 1000 ml of dark orange urine returned. Pt reports relief.

## 2015-03-07 NOTE — ED Provider Notes (Signed)
CSN: [id number]     Arrival date & time 03/07/15  0813 History   First MD Initiated Contact with Patient 03/07/15 [phone]     Chief Complaint  Patient presents with  . Urinary Retention     (Consider location/radiation/quality/duration/timing/severity/associated sxs/prior Treatment) HPI Comments: Patient has part of a tumor removed from his bladder on Monday due to bladder cancer and was discharge home with a foley catheter. The patient complains this morning of pain and that he has had no output since yesterday. He has a follow up appointment on Monday with Dr. Loel Lofty.   The history is provided by the patient. No language interpreter was used.    Past Medical History  Diagnosis Date  . Hyperlipidemia   . Hypertension   . GERD (gastroesophageal reflux disease)   . Esophageal stricture     a. History of dilitation - Long-standing history of esophageal reflux   . 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 1999: LIMA to LAD, sequential SVG to diagonal 2 and diagonal 3, sequential SVG to PDA, acute marginal branch, and PL branch. b. Repeat CABG 2015 with SVG to D1, SVG to PDA, and SVG to OM.   Marland Kitchen CAD (coronary artery disease), native coronary artery 09/24/2013    a. CABG x 6 1999. b. Interim PCIs: Angioplasty SVG-PDA 2006.Taxus DES to native LCx in 2007.DES to SVG-PDA 2007, restented 2009.NSTEMI with PCI to SVG- PDA complicated by no-reflow/inferior infarction in 2012. c. Redo CABG 2015 with SVG-PD, SVG-D1, SVG-OM1.  . Chronic combined systolic and diastolic CHF (congestive heart failure)     a. Prior EF 45-50% in 08/2014. b. Improved EF >55% in 09/2014 at [facility].  . Type II diabetes mellitus   . Anemia     a. + FOBT 08/2014.  Marland Kitchen History of hiatal hernia   . Daily headache   . Arthritis   . Chronic lower back pain   . CKD (chronic kidney disease) stage 3, GFR 30-59 ml/min   . Skin cancer   . Polymyalgia rheumatica   . SVT (supraventricular tachycardia)   . Atrial fibrillation   . Carotid artery  occlusion     a. Duplex 12/2013: mild plaque bilat, 40-59% BICA. F/u due 12/2014.  Marland Kitchen Atherosclerosis of native arteries of the extremities with intermittent claudication     a. L external iliac stent 2001, bilateral SFA occlusions documented 2001.  Marland Kitchen OSA (obstructive sleep apnea)     "don't wear mask"   . Hypotonic bladder     Requiring in and out catheterization performed by the patient at home - occasional blood tinged urine (self-cath's daily for the last 2 years) and hematuria is chronic for him, followed by urology.  . MI (myocardial infarction) 1999-02/2014    total of 7 MI per patient  . COPD (chronic obstructive pulmonary disease)     pt states he doesn't have  . UTI (urinary tract infection) on cipro   Past Surgical History  Procedure Laterality Date  . Coronary stent placement      "I've had 8 put in; I presently have none" (08/26/2014)  . Cardiac catheterization    . Intraoperative transesophageal echocardiogram N/A 03/01/2014    Procedure: INTRAOPERATIVE TRANSESOPHAGEAL ECHOCARDIOGRAM;  Surgeon: Gaye Pollack, MD;  Location: [facility] OR;  Service: Open Heart Surgery;  Laterality: N/A;  . Anterior cervical decomp/discectomy fusion    . Ankle fracture surgery Right ~ 1977    "10 plates and 7 screws placed in it"  . Tonsillectomy    .  Cataract extraction w/ intraocular lens  implant, bilateral Bilateral 2000's  . Mohs surgery  X 2    "nose; nose"  . Skin cancer excision      "all over my head; ears; back  . Esophagogastroduodenoscopy (egd) with esophageal dilation  X 1  . Coronary artery bypass graft  1999  . Coronary artery bypass graft N/A 03/01/2014    Procedure: REDO CORONARY ARTERY BYPASS GRAFTING TIMES THREE, USING RIGHT GREATER SAPHENOUS VEIN VIA ENDOVEIN HARVEST.;  Surgeon: Gaye Pollack, MD;  Location: [place] OR;  Service: Open Heart Surgery;  Laterality: N/A;  . Left heart catheterization with coronary/graft angiogram  02/26/2014    Procedure: LEFT HEART CATHETERIZATION WITH  Beatrix Fetters;  Surgeon: Sinclair Grooms, MD;  Location: [facility] CATH LAB;  Service: Cardiovascular;;  . Left heart catheterization with coronary/graft angiogram N/A 07/23/2014    Procedure: LEFT HEART CATHETERIZATION WITH Beatrix Fetters;  Surgeon: Sinclair Grooms, MD;  Location: [facility] CATH LAB;  Service: Cardiovascular;  Laterality: N/A;  . Left heart catheterization with coronary/graft angiogram N/A 08/30/2014    Procedure: LEFT HEART CATHETERIZATION WITH Beatrix Fetters;  Surgeon: Leonie Man, MD;  Location: [facility] CATH LAB;  Service: Cardiovascular;  Laterality: N/A;  . Abdominal aortagram N/A 11/27/2014    Procedure: ABDOMINAL Maxcine Ham;  Surgeon: Wellington Hampshire, MD;  Location: [place] CATH LAB;  Service: Cardiovascular;  Laterality: N/A;  . Multiple tooth extractions    . Colonoscopy    . Endarterectomy Left 01/30/2015    Procedure: ENDARTERECTOMY CAROTID WITH PRIMARY CLOSURE OF ARTERY;  Surgeon: Angelia Mould, MD;  Location: [place];  Service: Vascular;  Laterality: Left;  . Back surgery      neck surgery x 2  . Fracture surgery Right     ankle  . Cystoscopy w/ retrogrades Bilateral 03/03/2015    Procedure: CYSTOSCOPY WITH LEFT RETROGRADE PYELOGRAM;  Surgeon: Kathie Rhodes, MD;  Location: [facility] ORS;  Service: Urology;  Laterality: Bilateral;  . Transurethral resection of bladder tumor with gyrus (turbt-gyrus) N/A 03/03/2015    Procedure: TRANSURETHRAL RESECTION OF BLADDER TUMOR;  Surgeon: Kathie Rhodes, MD;  Location: [facility] ORS;  Service: Urology;  Laterality: N/A;   Family History  Problem Relation Age of Onset  . CAD Mother   . Heart disease Mother   . Hypertension Mother   . Cirrhosis Brother   . Heart disease Father   . Hyperlipidemia Father   . Hypertension Father   . Heart attack Father    History  Substance Use Topics  . Smoking status: Former Smoker -- 1.00 packs/day for 50 years    Types: Cigarettes    Quit date: 02/25/2014  . Smokeless tobacco:  Never Used  . Alcohol Use: No    Review of Systems  Genitourinary: Positive for dysuria and frequency.      Allergies  Review of patient's allergies indicates no known allergies.  Home Medications   Prior to Admission medications   Medication Sig Start Date End Date Taking? Authorizing Provider  acetaminophen (TYLENOL) 325 MG tablet Take 650 mg by mouth every 6 (six) hours as needed for moderate pain.   Yes Historical Provider, MD  amLODipine (NORVASC) 10 MG tablet Take 1 tablet (10 mg total) by mouth daily. 02/20/15  Yes Belva Crome, MD  aspirin EC 81 MG EC tablet Take 1 tablet (81 mg total) by mouth daily. 11/19/14  Yes Wellington Hampshire, MD  atorvastatin (LIPITOR) 20 MG tablet Take 20 mg by mouth  daily at 6 PM.    Yes Historical Provider, MD  cholecalciferol (VITAMIN D) 1000 UNITS tablet Take 1,000 Units by mouth daily.   Yes Historical Provider, MD  clopidogrel (PLAVIX) 75 MG tablet Take 1 tablet (75 mg total) by mouth daily. 11/19/14  Yes Wellington Hampshire, MD  Cranberry 500 MG CAPS Take 1 capsule by mouth at bedtime.   Yes Historical Provider, MD  ferrous sulfate 325 (65 FE) MG tablet Take 325 mg by mouth every evening.    Yes Historical Provider, MD  glipiZIDE (GLUCOTROL) 10 MG tablet Take 1 tablet (10 mg total) by mouth 2 (two) times daily before a meal. 09/03/14  Yes Delfina Redwood, MD  hydrALAZINE (APRESOLINE) 50 MG tablet Take 50 mg by mouth 3 (three) times daily as needed (Will only take when blood pressure is running high. Will at least take once daily).    Yes Historical Provider, MD  Insulin Glargine (LANTUS) 100 UNIT/ML Solostar Pen Inject 10 Units into the skin every morning. Patient taking differently: Inject 10 Units into the skin daily with breakfast.  09/03/14  Yes Delfina Redwood, MD  Insulin Pen Needle (FIFTY50 PEN NEEDLES) 31G X 5 MM MISC As directed 09/03/14  Yes Delfina Redwood, MD  isosorbide mononitrate (IMDUR) 120 MG 24 hr tablet TAKE 1 TABLET BY MOUTH  EVERY DAY 01/28/15  Yes Belva Crome, MD  metoprolol (LOPRESSOR) 50 MG tablet Take 1 tablet (50 mg total) by mouth 2 (two) times daily. 11/19/14  Yes Wellington Hampshire, MD  nitroGLYCERIN (NITROSTAT) 0.4 MG SL tablet Place 0.4 mg under the tongue every 5 (five) minutes as needed for chest pain.   Yes Historical Provider, MD  pantoprazole (PROTONIX) 40 MG tablet Take 1 tablet (40 mg total) by mouth daily. 11/19/14  Yes Wellington Hampshire, MD  phenazopyridine (PYRIDIUM) 200 MG tablet Take 1 tablet (200 mg total) by mouth 3 (three) times daily as needed for pain. 03/03/15  Yes Kathie Rhodes, MD  torsemide (DEMADEX) 20 MG tablet Take 2 tablets by mouth once a day 11/27/14  Yes Wellington Hampshire, MD  Oxycodone HCl 10 MG TABS Take 1 tablet (10 mg total) by mouth every 4 (four) hours as needed. Patient not taking: Reported on 03/07/2015 03/03/15   Kathie Rhodes, MD   BP 153/41 mmHg  Pulse 73  Temp(Src) 97.7 F (36.5 C) (Oral)  Resp 16  SpO2 95% Physical Exam  Constitutional: He is oriented to person, place, and time. He appears well-developed and well-nourished. No distress.  HENT:  Head: Normocephalic and atraumatic.  Mouth/Throat: No oropharyngeal exudate.  Eyes: Pupils are equal, round, and reactive to light.  Neck: Normal range of motion. Neck supple.  Cardiovascular: Normal rate, regular rhythm and normal heart sounds.  Exam reveals no gallop and no friction rub.   No murmur heard. Pulmonary/Chest: Effort normal and breath sounds normal. No respiratory distress. He has no wheezes. He has no rales.  Abdominal: Soft. Bowel sounds are normal. He exhibits no distension and no mass. There is tenderness in the suprapubic area. There is no rebound and no guarding.    Musculoskeletal: Normal range of motion. He exhibits no edema or tenderness.  Neurological: He is alert and oriented to person, place, and time.  Skin: Skin is warm and dry.  Psychiatric: He has a normal mood and affect.    ED Course    Procedures (including critical care time) Labs Review Labs Reviewed  URINALYSIS, [place]  MICROSCOPIC - Abnormal; Notable for the following:    Color, Urine RED (*)    APPearance TURBID (*)    Hgb urine dipstick LARGE (*)    Bilirubin Urine SMALL (*)    Protein, ur 100 (*)    Nitrite POSITIVE (*)    Leukocytes, UA LARGE (*)    All other components within normal limits  URINE CULTURE  URINE MICROSCOPIC-ADD ON    Imaging Review No results found.   EKG Interpretation None      MDM   Final diagnoses:  Foley catheter problem, initial encounter    Pt is a 74 y.o. male with Pmhx as above who presents with urinary retention after a transurethral bladder tumor removal with placement of Foley catheter.  He states that 2 days ago.  He was unable to drain any urine into the Foley.  He had a bowel movement at that time passed a large clot into the Foley in, urine began draining.  He has not had any urine into his Foley bag since last night and feels very distended.  He's had some nausea but no vomiting, she reports having a fever, the wife reports it was 99.  On physical exam, vital signs are stable.  He is afebrile and uncomfortable but in no acute distress.  Bladder feels very distended on abdominal exam.  There is about 5-10 mL of urine in Foley bag. Nursing will irrigate foley.   10:45 AM Catheter has been flushed.  The patient has had greater than 1 L urine out.  He feels much improved.  There is sediment and blood in the urine, UA with nitrites, leukocytes, which is likely from recent surgery. Culture will be sent. Pt recently finished course of abx for UTI. He has appt scheduled for Urology on Monday w/ Dr. Karsten Ro. Family will continue flushing catheter at home.    Nishanth C Suastegui evaluation in the Emergency Department is complete. It has been determined that no acute conditions requiring further emergency intervention are present at this time. The patient/guardian have been  advised of the diagnosis and plan. We have discussed signs and symptoms that warrant return to the ED, such as changes or worsening in symptoms, fever, worsening pain, failure of catheter to drain.       Ernestina Patches, MD 03/07/15 1045

## 2015-03-07 NOTE — Discharge Instructions (Signed)
Foley Catheter Care °A Foley catheter is a soft, flexible tube that is placed into the bladder to drain urine. A Foley catheter may be inserted if: °· You leak urine or are not able to control when you urinate (urinary incontinence). °· You are not able to urinate when you need to (urinary retention). °· You had prostate surgery or surgery on the genitals. °· You have certain medical conditions, such as multiple sclerosis, dementia, or a spinal cord injury. °If you are going home with a Foley catheter in place, follow the instructions below. °TAKING CARE OF THE CATHETER °1. Wash your hands with soap and water. °2. Using mild soap and warm water on a clean washcloth: °· Clean the area on your body closest to the catheter insertion site using a circular motion, moving away from the catheter. Never wipe toward the catheter because this could sweep bacteria up into the urethra and cause infection. °· Remove all traces of soap. Pat the area dry with a clean towel. For males, reposition the foreskin. °3. Attach the catheter to your leg so there is no tension on the catheter. Use adhesive tape or a leg strap. If you are using adhesive tape, remove any sticky residue left behind by the previous tape you used. °4. Keep the drainage bag below the level of the bladder, but keep it off the floor. °5. Check throughout the day to be sure the catheter is working and urine is draining freely. Make sure the tubing does not become kinked. °6. Do not pull on the catheter or try to remove it. Pulling could damage internal tissues. °TAKING CARE OF THE DRAINAGE BAGS °You will be given two drainage bags to take home. One is a large overnight drainage bag, and the other is a smaller leg bag that fits underneath clothing. You may wear the overnight bag at any time, but you should never wear the smaller leg bag at night. Follow the instructions below for how to empty, change, and clean your drainage bags. °Emptying the Drainage Bag °You must  empty your drainage bag when it is  -½ full or at least 2-3 times a day. °1. Wash your hands with soap and water. °2. Keep the drainage bag below your hips, below the level of your bladder. This stops urine from going back into the tubing and into your bladder. °3. Hold the dirty bag over the toilet or a clean container. °4. Open the pour spout at the bottom of the bag and empty the urine into the toilet or container. Do not let the pour spout touch the toilet, container, or any other surface. Doing so can place bacteria on the bag, which can cause an infection. °5. Clean the pour spout with a gauze pad or cotton ball that has rubbing alcohol on it. °6. Close the pour spout. °7. Attach the bag to your leg with adhesive tape or a leg strap. °8. Wash your hands well. °Changing the Drainage Bag °Change your drainage bag once a month or sooner if it starts to smell bad or look dirty. Below are steps to follow when changing the drainage bag. °1. Wash your hands with soap and water. °2. Pinch off the rubber catheter so that urine does not spill out. °3. Disconnect the catheter tube from the drainage tube at the connection valve. Do not let the tubes touch any surface. °4. Clean the end of the catheter tube with an alcohol wipe. Use a different alcohol wipe to clean the   end of the drainage tube. °5. Connect the catheter tube to the drainage tube of the clean drainage bag. °6. Attach the new bag to the leg with adhesive tape or a leg strap. Avoid attaching the new bag too tightly. °7. Wash your hands well. °Cleaning the Drainage Bag °1. Wash your hands with soap and water. °2. Wash the bag in warm, soapy water. °3. Rinse the bag thoroughly with warm water. °4. Fill the bag with a solution of white vinegar and water (1 cup vinegar to 1 qt warm water [.2 L vinegar to 1 L warm water]). Close the bag and soak it for 30 minutes in the solution. °5. Rinse the bag with warm water. °6. Hang the bag to dry with the pour spout open  and hanging downward. °7. Store the clean bag (once it is dry) in a clean plastic bag. °8. Wash your hands well. °PREVENTING INFECTION °· Wash your hands before and after handling your catheter. °· Take showers daily and wash the area where the catheter enters your body. Do not take baths. Replace wet leg straps with dry ones, if this applies. °· Do not use powders, sprays, or lotions on the genital area. Only use creams, lotions, or ointments as directed by your caregiver. °· For females, wipe from front to back after each bowel movement. °· Drink enough fluids to keep your urine clear or pale yellow unless you have a fluid restriction. °· Do not let the drainage bag or tubing touch or lie on the floor. °· Wear cotton underwear to absorb moisture and to keep your skin drier. °SEEK MEDICAL CARE IF:  °· Your urine is cloudy or smells unusually bad. °· Your catheter becomes clogged. °· You are not draining urine into the bag or your bladder feels full. °· Your catheter starts to leak. °SEEK IMMEDIATE MEDICAL CARE IF:  °· You have pain, swelling, redness, or pus where the catheter enters the body. °· You have pain in the abdomen, legs, lower back, or bladder. °· You have a fever. °· You see blood fill the catheter, or your urine is pink or red. °· You have nausea, vomiting, or chills. °· Your catheter gets pulled out. °MAKE SURE YOU:  °· Understand these instructions. °· Will watch your condition. °· Will get help right away if you are not doing well or get worse. °Document Released: 10/04/2005 Document Revised: 02/18/2014 Document Reviewed: 09/25/2012 °[employer]® Patient Information ©2015 [employer]. This information is not intended to replace advice given to you by your health care provider. Make sure you discuss any questions you have with your health care provider. ° °

## 2015-03-07 NOTE — ED Notes (Signed)
Patient has part of a tumor removed from his bladder on Monday due to bladder cancer and was discharge home with a foley catheter. The patient complains this morning of pain and that he has had no output since yesterday. He has a follow up appointment on Monday with Dr. Loel Lofty.

## 2015-03-09 LAB — URINE CULTURE

## 2015-03-10 DIAGNOSIS — N323 Diverticulum of bladder: Secondary | ICD-10-CM | POA: Diagnosis not present

## 2015-03-10 DIAGNOSIS — Z125 Encounter for screening for malignant neoplasm of prostate: Secondary | ICD-10-CM | POA: Diagnosis not present

## 2015-03-10 DIAGNOSIS — C679 Malignant neoplasm of bladder, unspecified: Secondary | ICD-10-CM | POA: Diagnosis not present

## 2015-03-10 DIAGNOSIS — N402 Nodular prostate without lower urinary tract symptoms: Secondary | ICD-10-CM | POA: Diagnosis not present

## 2015-03-12 ENCOUNTER — Telehealth: Payer: Self-pay | Admitting: *Deleted

## 2015-03-12 NOTE — ED Notes (Signed)
(+)  urine culture, no treatment required per Anne Ng, PA-C

## 2015-03-13 ENCOUNTER — Other Ambulatory Visit: Payer: Self-pay | Admitting: Urology

## 2015-03-13 DIAGNOSIS — C679 Malignant neoplasm of bladder, unspecified: Secondary | ICD-10-CM | POA: Diagnosis not present

## 2015-03-13 DIAGNOSIS — N323 Diverticulum of bladder: Secondary | ICD-10-CM | POA: Diagnosis not present

## 2015-03-13 DIAGNOSIS — R338 Other retention of urine: Secondary | ICD-10-CM | POA: Diagnosis not present

## 2015-03-13 DIAGNOSIS — R339 Retention of urine, unspecified: Secondary | ICD-10-CM | POA: Diagnosis not present

## 2015-03-16 ENCOUNTER — Encounter (HOSPITAL_BASED_OUTPATIENT_CLINIC_OR_DEPARTMENT_OTHER): Payer: Self-pay | Admitting: Emergency Medicine

## 2015-03-16 ENCOUNTER — Emergency Department (HOSPITAL_BASED_OUTPATIENT_CLINIC_OR_DEPARTMENT_OTHER)
Admission: EM | Admit: 2015-03-16 | Discharge: 2015-03-16 | Disposition: A | Discharge status: Home / Self Care | Payer: Medicare Other | Attending: Emergency Medicine | Admitting: Emergency Medicine

## 2015-03-16 DIAGNOSIS — Z7902 Long term (current) use of antithrombotics/antiplatelets: Secondary | ICD-10-CM | POA: Insufficient documentation

## 2015-03-16 DIAGNOSIS — I5042 Chronic combined systolic (congestive) and diastolic (congestive) heart failure: Secondary | ICD-10-CM | POA: Insufficient documentation

## 2015-03-16 DIAGNOSIS — Z8669 Personal history of other diseases of the nervous system and sense organs: Secondary | ICD-10-CM | POA: Insufficient documentation

## 2015-03-16 DIAGNOSIS — D649 Anemia, unspecified: Secondary | ICD-10-CM | POA: Diagnosis not present

## 2015-03-16 DIAGNOSIS — T83098A Other mechanical complication of other indwelling urethral catheter, initial encounter: Secondary | ICD-10-CM | POA: Insufficient documentation

## 2015-03-16 DIAGNOSIS — Z794 Long term (current) use of insulin: Secondary | ICD-10-CM | POA: Diagnosis not present

## 2015-03-16 DIAGNOSIS — Z951 Presence of aortocoronary bypass graft: Secondary | ICD-10-CM | POA: Insufficient documentation

## 2015-03-16 DIAGNOSIS — G8929 Other chronic pain: Secondary | ICD-10-CM | POA: Insufficient documentation

## 2015-03-16 DIAGNOSIS — E119 Type 2 diabetes mellitus without complications: Secondary | ICD-10-CM | POA: Diagnosis not present

## 2015-03-16 DIAGNOSIS — I129 Hypertensive chronic kidney disease with stage 1 through stage 4 chronic kidney disease, or unspecified chronic kidney disease: Secondary | ICD-10-CM | POA: Diagnosis not present

## 2015-03-16 DIAGNOSIS — Z85828 Personal history of other malignant neoplasm of skin: Secondary | ICD-10-CM | POA: Insufficient documentation

## 2015-03-16 DIAGNOSIS — N183 Chronic kidney disease, stage 3 (moderate): Secondary | ICD-10-CM | POA: Diagnosis not present

## 2015-03-16 DIAGNOSIS — I252 Old myocardial infarction: Secondary | ICD-10-CM | POA: Diagnosis not present

## 2015-03-16 DIAGNOSIS — I2510 Atherosclerotic heart disease of native coronary artery without angina pectoris: Secondary | ICD-10-CM | POA: Insufficient documentation

## 2015-03-16 DIAGNOSIS — Z87891 Personal history of nicotine dependence: Secondary | ICD-10-CM | POA: Insufficient documentation

## 2015-03-16 DIAGNOSIS — Z79899 Other long term (current) drug therapy: Secondary | ICD-10-CM | POA: Insufficient documentation

## 2015-03-16 DIAGNOSIS — Y846 Urinary catheterization as the cause of abnormal reaction of the patient, or of later complication, without mention of misadventure at the time of the procedure: Secondary | ICD-10-CM | POA: Insufficient documentation

## 2015-03-16 DIAGNOSIS — K219 Gastro-esophageal reflux disease without esophagitis: Secondary | ICD-10-CM | POA: Diagnosis not present

## 2015-03-16 DIAGNOSIS — E785 Hyperlipidemia, unspecified: Secondary | ICD-10-CM | POA: Insufficient documentation

## 2015-03-16 DIAGNOSIS — J449 Chronic obstructive pulmonary disease, unspecified: Secondary | ICD-10-CM | POA: Insufficient documentation

## 2015-03-16 DIAGNOSIS — M1990 Unspecified osteoarthritis, unspecified site: Secondary | ICD-10-CM | POA: Diagnosis not present

## 2015-03-16 DIAGNOSIS — Z7982 Long term (current) use of aspirin: Secondary | ICD-10-CM | POA: Insufficient documentation

## 2015-03-16 DIAGNOSIS — T839XXA Unspecified complication of genitourinary prosthetic device, implant and graft, initial encounter: Secondary | ICD-10-CM

## 2015-03-16 LAB — URINALYSIS, ROUTINE W REFLEX MICROSCOPIC
GLUCOSE, UA: NEGATIVE mg/dL
Ketones, ur: 40 mg/dL — AB
Nitrite: POSITIVE — AB
Specific Gravity, Urine: 1.008 (ref 1.005–1.030)
Urobilinogen, UA: 1 mg/dL (ref 0.0–1.0)
pH: 6 (ref 5.0–8.0)

## 2015-03-16 LAB — URINE MICROSCOPIC-ADD ON

## 2015-03-16 NOTE — ED Notes (Addendum)
Tolerating bladder irrigation well, bladder pain worse, clots and bright red urine returned, EDP aware. Bladder scan in progress. Lowest HR 29, asymptomatic, denies dizziness, sob weakness, near syncope. EDP aware. BP elevated at this time.

## 2015-03-16 NOTE — ED Notes (Signed)
Dr. Mingo Amber at [facility], pt tried to self irrigate x2 today w/o success

## 2015-03-16 NOTE — ED Provider Notes (Signed)
CSN: [id number]     Arrival date & time 03/16/15  1913 History  This chart was scribed for Evelina Bucy, MD by Stephania Fragmin, ED Scribe. This patient was seen in room MH02/MH02 and the patient's care was started at 7:45 PM.   Chief Complaint  Patient presents with  . Post-op Problem   HPI Comments: Foley clogged for about 3 hours. 2nd time in 10 days.   Patient is a 74 y.o. male presenting with male genitourinary complaint. The history is provided by the patient. No language interpreter was used.  Male GU Problem Presenting symptoms: no dysuria and no penile pain   Presenting symptoms comment:  Catheter clogged Context: spontaneously   Context comment:  Has had Foley in after a TURBT-resection  Relieved by:  Nothing Worsened by:  Nothing tried Ineffective treatments: flushing the foley. Associated symptoms: hematuria   Associated symptoms: no fever   Risk factors: bladder surgery      HPI Comments: Dijon C Gagan is a 74 y.o. male currently taking aspirin and Plavix and S/P transurethral resection of bladder tumor with gyrus (turbt-gyrus) performed about 2 weeks ago by Dr. Karsten Ro. He presents to the Emergency Department complaining of a foley catheter problem - his catheter is clogged. He complains of associated hematuria. Patient states he self-caths with a #10 catheter and his #14 foley catheter has been in for 1 week. Patient had a CT scan done 3 days ago at [facility]. He is not scheduled to see Dr. Karsten Ro until June 9, in about 10 days. Patient denies currently being on antibiotics.  Past Medical History  Diagnosis Date  . Hyperlipidemia   . Hypertension   . GERD (gastroesophageal reflux disease)   . Esophageal stricture     a. History of dilitation - Long-standing history of esophageal reflux   . 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 1999: LIMA to LAD, sequential SVG to diagonal 2 and diagonal 3, sequential SVG to PDA, acute marginal branch, and PL branch. b. Repeat CABG 2015 with  SVG to D1, SVG to PDA, and SVG to OM.   Marland Kitchen CAD (coronary artery disease), native coronary artery 09/24/2013    a. CABG x 6 1999. b. Interim PCIs: Angioplasty SVG-PDA 2006.Taxus DES to native LCx in 2007.DES to SVG-PDA 2007, restented 2009.NSTEMI with PCI to SVG- PDA complicated by no-reflow/inferior infarction in 2012. c. Redo CABG 2015 with SVG-PD, SVG-D1, SVG-OM1.  . Chronic combined systolic and diastolic CHF (congestive heart failure)     a. Prior EF 45-50% in 08/2014. b. Improved EF >55% in 09/2014 at [facility].  . Type II diabetes mellitus   . Anemia     a. + FOBT 08/2014.  Marland Kitchen History of hiatal hernia   . Daily headache   . Arthritis   . Chronic lower back pain   . CKD (chronic kidney disease) stage 3, GFR 30-59 ml/min   . Skin cancer   . Polymyalgia rheumatica   . SVT (supraventricular tachycardia)   . Atrial fibrillation   . Carotid artery occlusion     a. Duplex 12/2013: mild plaque bilat, 40-59% BICA. F/u due 12/2014.  Marland Kitchen Atherosclerosis of native arteries of the extremities with intermittent claudication     a. L external iliac stent 2001, bilateral SFA occlusions documented 2001.  Marland Kitchen OSA (obstructive sleep apnea)     "don't wear mask"   . Hypotonic bladder     Requiring in and out catheterization performed by the patient at home -  occasional blood tinged urine (self-cath's daily for the last 2 years) and hematuria is chronic for him, followed by urology.  . MI (myocardial infarction) 1999-02/2014    total of 7 MI per patient  . COPD (chronic obstructive pulmonary disease)     pt states he doesn't have  . UTI (urinary tract infection) on cipro   Past Surgical History  Procedure Laterality Date  . Coronary stent placement      "I've had 8 put in; I presently have none" (08/26/2014)  . Cardiac catheterization    . Intraoperative transesophageal echocardiogram N/A 03/01/2014    Procedure: INTRAOPERATIVE TRANSESOPHAGEAL ECHOCARDIOGRAM;  Surgeon: Gaye Pollack, MD;  Location: [facility];   Service: Open Heart Surgery;  Laterality: N/A;  . Anterior cervical decomp/discectomy fusion    . Ankle fracture surgery Right ~ 1977    "10 plates and 7 screws placed in it"  . Tonsillectomy    . Cataract extraction w/ intraocular lens  implant, bilateral Bilateral 2000's  . Mohs surgery  X 2    "nose; nose"  . Skin cancer excision      "all over my head; ears; back  . Esophagogastroduodenoscopy (egd) with esophageal dilation  X 1  . Coronary artery bypass graft  1999  . Coronary artery bypass graft N/A 03/01/2014    Procedure: REDO CORONARY ARTERY BYPASS GRAFTING TIMES THREE, USING RIGHT GREATER SAPHENOUS VEIN VIA ENDOVEIN HARVEST.;  Surgeon: Gaye Pollack, MD;  Location: [facility] OR;  Service: Open Heart Surgery;  Laterality: N/A;  . Left heart catheterization with coronary/graft angiogram  02/26/2014    Procedure: LEFT HEART CATHETERIZATION WITH Beatrix Fetters;  Surgeon: Sinclair Grooms, MD;  Location: [facility] CATH LAB;  Service: Cardiovascular;;  . Left heart catheterization with coronary/graft angiogram N/A 07/23/2014    Procedure: LEFT HEART CATHETERIZATION WITH Beatrix Fetters;  Surgeon: Sinclair Grooms, MD;  Location: [facility] CATH LAB;  Service: Cardiovascular;  Laterality: N/A;  . Left heart catheterization with coronary/graft angiogram N/A 08/30/2014    Procedure: LEFT HEART CATHETERIZATION WITH Beatrix Fetters;  Surgeon: Leonie Man, MD;  Location: [facility] CATH LAB;  Service: Cardiovascular;  Laterality: N/A;  . Abdominal aortagram N/A 11/27/2014    Procedure: ABDOMINAL Maxcine Ham;  Surgeon: Wellington Hampshire, MD;  Location: [facility] CATH LAB;  Service: Cardiovascular;  Laterality: N/A;  . Multiple tooth extractions    . Colonoscopy    . Endarterectomy Left 01/30/2015    Procedure: ENDARTERECTOMY CAROTID WITH PRIMARY CLOSURE OF ARTERY;  Surgeon: Angelia Mould, MD;  Location: [facility];  Service: Vascular;  Laterality: Left;  . Back surgery      neck surgery x 2  .  Fracture surgery Right     ankle  . Cystoscopy w/ retrogrades Bilateral 03/03/2015    Procedure: CYSTOSCOPY WITH LEFT RETROGRADE PYELOGRAM;  Surgeon: Kathie Rhodes, MD;  Location: [facility] ORS;  Service: Urology;  Laterality: Bilateral;  . Transurethral resection of bladder tumor with gyrus (turbt-gyrus) N/A 03/03/2015    Procedure: TRANSURETHRAL RESECTION OF BLADDER TUMOR;  Surgeon: Kathie Rhodes, MD;  Location: [facility] ORS;  Service: Urology;  Laterality: N/A;   Family History  Problem Relation Age of Onset  . CAD Mother   . Heart disease Mother   . Hypertension Mother   . Cirrhosis Brother   . Heart disease Father   . Hyperlipidemia Father   . Hypertension Father   . Heart attack Father    History  Substance Use Topics  . Smoking status:  Former Smoker -- 1.00 packs/day for 50 years    Types: Cigarettes    Quit date: 02/25/2014  . Smokeless tobacco: Never Used  . Alcohol Use: No    Review of Systems  Constitutional: Negative for fever.  Genitourinary: Positive for hematuria. Negative for dysuria and penile pain.  All other systems reviewed and are negative.     Allergies  Review of patient's allergies indicates no known allergies.  Home Medications   Prior to Admission medications   Medication Sig Start Date End Date Taking? Authorizing Provider  acetaminophen (TYLENOL) 325 MG tablet Take 650 mg by mouth every 6 (six) hours as needed for moderate pain.    Historical Provider, MD  amLODipine (NORVASC) 10 MG tablet Take 1 tablet (10 mg total) by mouth daily. 02/20/15   Belva Crome, MD  aspirin EC 81 MG EC tablet Take 1 tablet (81 mg total) by mouth daily. 11/19/14   Wellington Hampshire, MD  atorvastatin (LIPITOR) 20 MG tablet Take 20 mg by mouth daily at 6 PM.     Historical Provider, MD  cholecalciferol (VITAMIN D) 1000 UNITS tablet Take 1,000 Units by mouth daily.    Historical Provider, MD  clopidogrel (PLAVIX) 75 MG tablet Take 1 tablet (75 mg total) by mouth daily. 11/19/14   Wellington Hampshire, MD  Cranberry 500 MG CAPS Take 1 capsule by mouth at bedtime.    Historical Provider, MD  ferrous sulfate 325 (65 FE) MG tablet Take 325 mg by mouth every evening.     Historical Provider, MD  glipiZIDE (GLUCOTROL) 10 MG tablet Take 1 tablet (10 mg total) by mouth 2 (two) times daily before a meal. 09/03/14   Delfina Redwood, MD  hydrALAZINE (APRESOLINE) 50 MG tablet Take 50 mg by mouth 3 (three) times daily as needed (Will only take when blood pressure is running high. Will at least take once daily).     Historical Provider, MD  Insulin Glargine (LANTUS) 100 UNIT/ML Solostar Pen Inject 10 Units into the skin every morning. Patient taking differently: Inject 10 Units into the skin daily with breakfast.  09/03/14   Delfina Redwood, MD  Insulin Pen Needle (FIFTY50 PEN NEEDLES) 31G X 5 MM MISC As directed 09/03/14   Delfina Redwood, MD  isosorbide mononitrate (IMDUR) 120 MG 24 hr tablet TAKE 1 TABLET BY MOUTH EVERY DAY 01/28/15   Belva Crome, MD  metoprolol (LOPRESSOR) 50 MG tablet Take 1 tablet (50 mg total) by mouth 2 (two) times daily. 11/19/14   Wellington Hampshire, MD  nitroGLYCERIN (NITROSTAT) 0.4 MG SL tablet Place 0.4 mg under the tongue every 5 (five) minutes as needed for chest pain.    Historical Provider, MD  Oxycodone HCl 10 MG TABS Take 1 tablet (10 mg total) by mouth every 4 (four) hours as needed. Patient not taking: Reported on 03/07/2015 03/03/15   Kathie Rhodes, MD  pantoprazole (PROTONIX) 40 MG tablet Take 1 tablet (40 mg total) by mouth daily. 11/19/14   Wellington Hampshire, MD  phenazopyridine (PYRIDIUM) 200 MG tablet Take 1 tablet (200 mg total) by mouth 3 (three) times daily as needed for pain. 03/03/15   Kathie Rhodes, MD  torsemide (DEMADEX) 20 MG tablet Take 2 tablets by mouth once a day 11/27/14   Wellington Hampshire, MD   BP 166/60 mmHg  Pulse 44  Resp 18  Ht 5\' 6"  (1.676 m)  Wt 155 lb (70.308 kg)  BMI 25.03 kg/m2  SpO2 98% Physical Exam  Constitutional: He is  oriented to person, place, and time. He appears well-developed and well-nourished. No distress.  HENT:  Head: Normocephalic and atraumatic.  Mouth/Throat: No oropharyngeal exudate.  Eyes: EOM are normal. Pupils are equal, round, and reactive to light.  Neck: Normal range of motion. Neck supple.  Cardiovascular: Normal rate and regular rhythm.  Exam reveals no friction rub.   No murmur heard. Pulmonary/Chest: Effort normal and breath sounds normal. No respiratory distress. He has no wheezes. He has no rales.  Abdominal: He exhibits distension (bladder). There is tenderness (lower abdomen). There is no rebound.  Musculoskeletal: Normal range of motion. He exhibits no edema.  Neurological: He is alert and oriented to person, place, and time.  Skin: He is not diaphoretic.  Nursing note and vitals reviewed.   ED Course  Procedures (including critical care time)  DIAGNOSTIC STUDIES: Oxygen Saturation is 98% on RA, normal by my interpretation.    COORDINATION OF CARE: 7:51 PM - Discussed treatment plan with pt at bedside which includes, and pt agreed to plan.   Labs Review Labs Reviewed  URINALYSIS, ROUTINE W REFLEX MICROSCOPIC (NOT AT [facility]) - Abnormal; Notable for the following:    Color, Urine RED (*)    APPearance TURBID (*)    Hgb urine dipstick LARGE (*)    Bilirubin Urine LARGE (*)    Ketones, ur 40 (*)    Protein, ur >300 (*)    Nitrite POSITIVE (*)    Leukocytes, UA LARGE (*)    All other components within normal limits  URINE CULTURE  URINE MICROSCOPIC-ADD ON    Imaging Review No results found.   EKG Interpretation None      MDM   Final diagnoses:  Complication of Foley catheter, initial encounter    74 year old male here with a clogged Foley. He is on aspirin and Plavix. He's had the Foley placed after a bladder tumor scraping. He's had indwelling Foley since mid May. He was seen 9 days ago for a clogged Foley catheter and had a liter output after his Foley  was irrigated. A culture sent at that time shows overall 100,000 colonies of Escherichia coli. Here he is has some bladder distention with tenderness. He has liquid blood in his Foley catheter bag. No clots noted. Will irrigate if we cannot open up the family, we will replace it. I will speak with Urology about treatment for his UTI. Dr. Jeffie Pollock stated he should stop his Plavix and aspirin. I spoke with patient about this, he will continue his dual anti-platelet therapy and talk to his cardiologist. He stated to hold off on antibiotics at this time. 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 Instructed for him to f/u with Dr. Karsten Ro.  Patient had some episodes of bradycardia. He states he has a history of this. He's on beta-blockers.  Foley changed, given foley with 30 cc balloon with good drainage.   I personally performed the services described in this documentation, which was scribed in my presence. The recorded information has been reviewed and is accurate.      Evelina Bucy, MD 03/16/15 [phone]

## 2015-03-16 NOTE — ED Notes (Signed)
Alert, NAD, calm, interactive, "uncomfortable, progressive bladder pain".

## 2015-03-16 NOTE — ED Notes (Signed)
Foley still draining, NAD, calm, interactive, family at [facility].

## 2015-03-16 NOTE — ED Notes (Signed)
Pt in c/o clogged foley catheter, had surgery in April to remove bladder tumor. Pt has tried to purge catheter with no success.

## 2015-03-19 LAB — URINE CULTURE
Colony Count: 15000
Special Requests: NORMAL

## 2015-03-25 DIAGNOSIS — R338 Other retention of urine: Secondary | ICD-10-CM | POA: Diagnosis not present

## 2015-03-25 DIAGNOSIS — N312 Flaccid neuropathic bladder, not elsewhere classified: Secondary | ICD-10-CM | POA: Diagnosis not present

## 2015-03-25 DIAGNOSIS — C679 Malignant neoplasm of bladder, unspecified: Secondary | ICD-10-CM | POA: Diagnosis not present

## 2015-03-25 DIAGNOSIS — R300 Dysuria: Secondary | ICD-10-CM | POA: Diagnosis not present

## 2015-04-01 ENCOUNTER — Ambulatory Visit (INDEPENDENT_AMBULATORY_CARE_PROVIDER_SITE_OTHER): Payer: Medicare Other | Admitting: Cardiovascular Disease

## 2015-04-01 ENCOUNTER — Encounter: Payer: Self-pay | Admitting: Cardiovascular Disease

## 2015-04-01 VITALS — BP 148/64 | HR 60 | Ht 66.0 in | Wt 160.8 lb

## 2015-04-01 DIAGNOSIS — I70219 Atherosclerosis of native arteries of extremities with intermittent claudication, unspecified extremity: Secondary | ICD-10-CM | POA: Diagnosis not present

## 2015-04-01 DIAGNOSIS — I25810 Atherosclerosis of coronary artery bypass graft(s) without angina pectoris: Secondary | ICD-10-CM | POA: Diagnosis not present

## 2015-04-01 DIAGNOSIS — I10 Essential (primary) hypertension: Secondary | ICD-10-CM

## 2015-04-01 DIAGNOSIS — I6523 Occlusion and stenosis of bilateral carotid arteries: Secondary | ICD-10-CM | POA: Diagnosis not present

## 2015-04-01 DIAGNOSIS — E785 Hyperlipidemia, unspecified: Secondary | ICD-10-CM | POA: Diagnosis not present

## 2015-04-01 NOTE — Patient Instructions (Signed)
Medication Instructions:   Your physician recommends that you continue on your current medications as directed. Please refer to the Current Medication list given to you today.   Labwork:   Testing/Procedures:   Follow-Up: Your physician wants you to follow-up in:  In 6 months with Dr. Zannie Cove will receive a reminder letter in the mail two months in advance. If you don't receive a letter, please call our office to schedule the follow-up appointment.   Any Other Special Instructions Will Be Listed Below (If Applicable).

## 2015-04-01 NOTE — Progress Notes (Signed)
Primary cardiologist: Dr. Tamala Julian  HPI  This is a pleasant 74 year old man who is here today for a follow up visit regarding  peripheral arterial disease. He has extensive medical problems that include CAD (CABG in 1999 with multiple PCIs since, repeat CABG 02/2014, HLD, OSA, CHF (EF 45-50% in 08/2014 with grade II DD, >55% by echo 09/2014 at [facility]), T2DM, COPD, OSA (doesn't wear mask), stage III-IV CKD, polymyalgia rheumatica, PVD including carotid stenosis (moderate by duplex 12/2013, remote LE stenting) and anemia.  2D echo 08/26/14: EF 45-50%, akinesis inferiorly, grade 2 d/d, mild MR, no sig change from prior. LDL 64.  He has prolonged history of peripheral arterial disease with previous iliac stenting in 2001 done by Dr. Amedeo Plenty. He is known to have bilateral SFA occlusions. He reports bilateral calf claudication which is equal in both sides after walking about 50 yards. He has to rest for about 5-10 minutes before he can resume . His symptoms have been stable for a few years. He quit smoking in May 2015. No rest pain or lower extremity ulceration. He does have chronic kidney disease with Creatinine around 2.  Noninvasive evaluation showed an ABI in 0.4 range bilaterally with chronic SFA occlusion and severe bilateral external iliac artery stenosis.  I proceeded with CO2 angiography in February which showed: 1. Diffuse bilateral iliac disease. The only potential area of treatment is the left common iliac artery. However, there is diffuse disease throughout the whole iliac system with very small diameter. Thus, there is no good treatment options for endovascular intervention. 2. Chronically occluded SFAs.  He was treated medically. He underwent left carotid endarterectomy in April without complications. He was diagnosed with bladder cancer in May and will be undergoing surgery soon. He reports that his claudication is stable and continues to be worse on the left side.  No Known Allergies   Current  Outpatient Prescriptions on File Prior to Visit  Medication Sig Dispense Refill  . amLODipine (NORVASC) 10 MG tablet Take 1 tablet (10 mg total) by mouth daily.    Marland Kitchen aspirin EC 81 MG EC tablet Take 1 tablet (81 mg total) by mouth daily. 1 tablet 0  . atorvastatin (LIPITOR) 20 MG tablet Take 20 mg by mouth daily at 6 PM.     . Cranberry 500 MG CAPS Take 1 capsule by mouth at bedtime.    . ferrous sulfate 325 (65 FE) MG tablet Take 325 mg by mouth every evening.     Marland Kitchen glipiZIDE (GLUCOTROL) 10 MG tablet Take 1 tablet (10 mg total) by mouth 2 (two) times daily before a meal. 60 tablet 0  . hydrALAZINE (APRESOLINE) 50 MG tablet Take 50 mg by mouth 3 (three) times daily as needed (Will only take when blood pressure is running high. Will at least take once daily).     . Insulin Glargine (LANTUS) 100 UNIT/ML Solostar Pen Inject 10 Units into the skin every morning. (Patient taking differently: Inject 10 Units into the skin daily with breakfast. ) 15 mL 1  . Insulin Pen Needle (FIFTY50 PEN NEEDLES) 31G X 5 MM MISC As directed 30 each 1  . isosorbide mononitrate (IMDUR) 120 MG 24 hr tablet TAKE 1 TABLET BY MOUTH EVERY DAY 30 tablet 6  . metoprolol (LOPRESSOR) 50 MG tablet Take 1 tablet (50 mg total) by mouth 2 (two) times daily. 60 tablet 11  . nitroGLYCERIN (NITROSTAT) 0.4 MG SL tablet Place 0.4 mg under the tongue every 5 (five) minutes as needed for  chest pain.    . pantoprazole (PROTONIX) 40 MG tablet Take 1 tablet (40 mg total) by mouth daily. 90 tablet 3  . torsemide (DEMADEX) 20 MG tablet Take 2 tablets by mouth once a day    . clopidogrel (PLAVIX) 75 MG tablet Take 1 tablet (75 mg total) by mouth daily. (Patient not taking: Reported on 04/01/2015) 90 tablet 3   No current facility-administered medications on file prior to visit.     Past Medical History  Diagnosis Date  . Hyperlipidemia   . Hypertension   . GERD (gastroesophageal reflux disease)   . Esophageal stricture     a. History of  dilitation - Long-standing history of esophageal reflux   . 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 1999: LIMA to LAD, sequential SVG to diagonal 2 and diagonal 3, sequential SVG to PDA, acute marginal branch, and PL branch. b. Repeat CABG 2015 with SVG to D1, SVG to PDA, and SVG to OM.   Marland Kitchen CAD (coronary artery disease), native coronary artery 09/24/2013    a. CABG x 6 1999. b. Interim PCIs: Angioplasty SVG-PDA 2006.Taxus DES to native LCx in 2007.DES to SVG-PDA 2007, restented 2009.NSTEMI with PCI to SVG- PDA complicated by no-reflow/inferior infarction in 2012. c. Redo CABG 2015 with SVG-PD, SVG-D1, SVG-OM1.  . Chronic combined systolic and diastolic CHF (congestive heart failure)     a. Prior EF 45-50% in 08/2014. b. Improved EF >55% in 09/2014 at [facility].  . Type II diabetes mellitus   . Anemia     a. + FOBT 08/2014.  Marland Kitchen History of hiatal hernia   . Daily headache   . Arthritis   . Chronic lower back pain   . CKD (chronic kidney disease) stage 3, GFR 30-59 ml/min   . Skin cancer   . Polymyalgia rheumatica   . SVT (supraventricular tachycardia)   . Atrial fibrillation   . Carotid artery occlusion     a. Duplex 12/2013: mild plaque bilat, 40-59% BICA. F/u due 12/2014.  Marland Kitchen Atherosclerosis of native arteries of the extremities with intermittent claudication     a. L external iliac stent 2001, bilateral SFA occlusions documented 2001.  Marland Kitchen OSA (obstructive sleep apnea)     "don't wear mask"   . Hypotonic bladder     Requiring in and out catheterization performed by the patient at home - occasional blood tinged urine (self-cath's daily for the last 2 years) and hematuria is chronic for him, followed by urology.  . MI (myocardial infarction) 1999-02/2014    total of 7 MI per patient  . COPD (chronic obstructive pulmonary disease)     pt states he doesn't have  . UTI (urinary tract infection) on cipro     Past Surgical History  Procedure Laterality Date  . Coronary stent placement      "I've had 8 put  in; I presently have none" (08/26/2014)  . Cardiac catheterization    . Intraoperative transesophageal echocardiogram N/A 03/01/2014    Procedure: INTRAOPERATIVE TRANSESOPHAGEAL ECHOCARDIOGRAM;  Surgeon: Gaye Pollack, MD;  Location: [facility] OR;  Service: Open Heart Surgery;  Laterality: N/A;  . Anterior cervical decomp/discectomy fusion    . Ankle fracture surgery Right ~ 1977    "10 plates and 7 screws placed in it"  . Tonsillectomy    . Cataract extraction w/ intraocular lens  implant, bilateral Bilateral 2000's  . Mohs surgery  X 2    "nose; nose"  . Skin cancer excision      "  all over my head; ears; back  . Esophagogastroduodenoscopy (egd) with esophageal dilation  X 1  . Coronary artery bypass graft  1999  . Coronary artery bypass graft N/A 03/01/2014    Procedure: REDO CORONARY ARTERY BYPASS GRAFTING TIMES THREE, USING RIGHT GREATER SAPHENOUS VEIN VIA ENDOVEIN HARVEST.;  Surgeon: Gaye Pollack, MD;  Location: [place] OR;  Service: Open Heart Surgery;  Laterality: N/A;  . Left heart catheterization with coronary/graft angiogram  02/26/2014    Procedure: LEFT HEART CATHETERIZATION WITH Beatrix Fetters;  Surgeon: Sinclair Grooms, MD;  Location: [facility] CATH LAB;  Service: Cardiovascular;;  . Left heart catheterization with coronary/graft angiogram N/A 07/23/2014    Procedure: LEFT HEART CATHETERIZATION WITH Beatrix Fetters;  Surgeon: Sinclair Grooms, MD;  Location: [facility] CATH LAB;  Service: Cardiovascular;  Laterality: N/A;  . Left heart catheterization with coronary/graft angiogram N/A 08/30/2014    Procedure: LEFT HEART CATHETERIZATION WITH Beatrix Fetters;  Surgeon: Leonie Man, MD;  Location: [facility] CATH LAB;  Service: Cardiovascular;  Laterality: N/A;  . Abdominal aortagram N/A 11/27/2014    Procedure: ABDOMINAL Maxcine Ham;  Surgeon: Wellington Hampshire, MD;  Location: [place] CATH LAB;  Service: Cardiovascular;  Laterality: N/A;  . Multiple tooth extractions    . Colonoscopy    .  Endarterectomy Left 01/30/2015    Procedure: ENDARTERECTOMY CAROTID WITH PRIMARY CLOSURE OF ARTERY;  Surgeon: Angelia Mould, MD;  Location: [place];  Service: Vascular;  Laterality: Left;  . Back surgery      neck surgery x 2  . Fracture surgery Right     ankle  . Cystoscopy w/ retrogrades Bilateral 03/03/2015    Procedure: CYSTOSCOPY WITH LEFT RETROGRADE PYELOGRAM;  Surgeon: Kathie Rhodes, MD;  Location: [facility] ORS;  Service: Urology;  Laterality: Bilateral;  . Transurethral resection of bladder tumor with gyrus (turbt-gyrus) N/A 03/03/2015    Procedure: TRANSURETHRAL RESECTION OF BLADDER TUMOR;  Surgeon: Kathie Rhodes, MD;  Location: [facility] ORS;  Service: Urology;  Laterality: N/A;     Family History  Problem Relation Age of Onset  . CAD Mother   . Heart disease Mother   . Hypertension Mother   . Cirrhosis Brother   . Heart disease Father   . Hyperlipidemia Father   . Hypertension Father   . Heart attack Father      History   Social History  . Marital Status: Married    Spouse Name: N/A  . Number of Children: N/A  . Years of Education: N/A   Occupational History  . Not on file.   Social History Main Topics  . Smoking status: Former Smoker -- 1.00 packs/day for 50 years    Types: Cigarettes    Quit date: 02/25/2014  . Smokeless tobacco: Never Used  . Alcohol Use: No  . Drug Use: No  . Sexual Activity: Not on file   Other Topics Concern  . Not on file   Social History Narrative   Tour manager            ROS A 10 point review of system was performed. It is negative other than that mentioned in the history of present illness.   PHYSICAL EXAM   BP 148/64 mmHg  Pulse 60  Ht 5\' 6"  (1.676 m)  Wt 160 lb 12.8 oz (72.938 kg)  BMI 25.97 kg/m2 Constitutional: He is oriented to person, place, and time. He appears well-developed and well-nourished. No distress.  HENT: No nasal discharge.  Head: Normocephalic and atraumatic.  Eyes: Pupils are equal and  round.  No discharge. Neck: Normal range of motion. Neck supple. No JVD present. No thyromegaly present.  Cardiovascular: Normal rate, regular rhythm, normal heart sounds. Exam reveals no gallop and no friction rub. No murmur heard.  Pulmonary/Chest: Effort normal and breath sounds normal. No stridor. No respiratory distress. He has no wheezes. He has no rales. He exhibits no tenderness.  Abdominal: Soft. Bowel sounds are normal. He exhibits no distension. There is no tenderness. There is no rebound and no guarding.  Musculoskeletal: Normal range of motion. He exhibits no edema and no tenderness.  Neurological: He is alert and oriented to person, place, and time. Coordination normal.  Skin: Skin is warm and dry. No rash noted. He is not diaphoretic. No erythema. No pallor.  Psychiatric: He has a normal mood and affect. His behavior is normal. Judgment and thought content normal.  Vascular: Femoral pulse: +1 on the right side and absent on the left side, distal pulses are not palpable        ASSESSMENT AND PLAN

## 2015-04-02 NOTE — Assessment & Plan Note (Signed)
The patient has diffuse iliac disease bilaterally. However, the left common iliac artery has a tightest stenosis. He also has bilateral SFA occlusions. He has advanced chronic kidney disease which limits the options for endovascular intervention. He was recently diagnosed with bladder cancer and would be undergoing surgery. His claudication seems to be stable at the present time and thus I recommend continuing medical therapy for now.

## 2015-04-02 NOTE — Assessment & Plan Note (Signed)
Lab Results  Component Value Date   CHOL 133 09/03/2014   HDL 41 09/03/2014   LDLCALC 64 09/03/2014   TRIG 138 09/03/2014   CHOLHDL 3.2 09/03/2014   Continue treatment with atorvastatin.

## 2015-04-02 NOTE — Assessment & Plan Note (Signed)
No reported chest pain.

## 2015-04-02 NOTE — Assessment & Plan Note (Signed)
Blood pressure is reasonably controlled. 

## 2015-04-14 ENCOUNTER — Other Ambulatory Visit: Payer: Self-pay

## 2015-04-15 ENCOUNTER — Ambulatory Visit (INDEPENDENT_AMBULATORY_CARE_PROVIDER_SITE_OTHER): Payer: Medicare Other | Admitting: Interventional Cardiology

## 2015-04-15 ENCOUNTER — Encounter: Payer: Self-pay | Admitting: Interventional Cardiology

## 2015-04-15 VITALS — BP 146/50 | HR 72 | Ht 66.0 in | Wt 161.4 lb

## 2015-04-15 DIAGNOSIS — I70219 Atherosclerosis of native arteries of extremities with intermittent claudication, unspecified extremity: Secondary | ICD-10-CM

## 2015-04-15 DIAGNOSIS — I5032 Chronic diastolic (congestive) heart failure: Secondary | ICD-10-CM | POA: Diagnosis not present

## 2015-04-15 DIAGNOSIS — I6523 Occlusion and stenosis of bilateral carotid arteries: Secondary | ICD-10-CM

## 2015-04-15 DIAGNOSIS — Z720 Tobacco use: Secondary | ICD-10-CM

## 2015-04-15 DIAGNOSIS — I10 Essential (primary) hypertension: Secondary | ICD-10-CM

## 2015-04-15 DIAGNOSIS — K222 Esophageal obstruction: Secondary | ICD-10-CM | POA: Diagnosis not present

## 2015-04-15 DIAGNOSIS — I25810 Atherosclerosis of coronary artery bypass graft(s) without angina pectoris: Secondary | ICD-10-CM | POA: Diagnosis not present

## 2015-04-15 DIAGNOSIS — E785 Hyperlipidemia, unspecified: Secondary | ICD-10-CM

## 2015-04-15 DIAGNOSIS — F17200 Nicotine dependence, unspecified, uncomplicated: Secondary | ICD-10-CM

## 2015-04-15 NOTE — Patient Instructions (Signed)
Medication Instructions:  Your physician recommends that you continue on your current medications as directed. Please refer to the Current Medication list given to you today.    Labwork: None   Testing/Procedures: None   Follow-Up: Your physician wants you to follow-up in: 6 months with Dr.Smith You will receive a reminder letter in the mail two months in advance. If you don't receive a letter, please call our office to schedule the follow-up appointment.   Any Other Special Instructions Will Be Listed Below (If Applicable).   

## 2015-04-15 NOTE — Progress Notes (Signed)
Cardiology Office Note   Date:  04/15/2015   ID:  [name], DOB [birth year], MRN [id number]  PCP:  David Frees, Potter  Cardiologist:  [name]   Chief Complaint  Patient presents with  . Coronary Artery Disease      History of Present Illness: [name] is a 74 y.o. male who presents for CAD, prior CABG, chronic diastolic heart failure, diabetes mellitus with diabetic kidney disease, hypertension, PAD, and hyperlipidemia.  Patient had multiple episodes of congestive heart failure in late 2015. He also had angina. Since that time and getting on a stable medication regimen for heart failure, he has had no angina. Medication regimen is been stable. He denies nitroglycerin use.  Unfortunately he has been diagnosed with bladder cancer. He is artery had 1 surgery Complicated by bleeding He resume Plavix too soon after surgery. He has not operation that is pending. He is currently off aspirin and Plavix. The operation is scheduled for July 11. The operation will be with [name].    Past Medical History  Diagnosis Date  . Hyperlipidemia   . Hypertension   . GERD (gastroesophageal reflux disease)   . Esophageal stricture     a. History of dilitation - Long-standing history of esophageal reflux   . 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 1999: LIMA to LAD, sequential SVG to diagonal 2 and diagonal 3, sequential SVG to PDA, acute marginal branch, and PL branch. b. Repeat CABG 2015 with SVG to D1, SVG to PDA, and SVG to OM.   Marland Kitchen CAD (coronary artery disease), native coronary artery 09/24/2013    a. CABG x 6 1999. b. Interim PCIs: Angioplasty SVG-PDA 2006.Taxus DES to native LCx in 2007.DES to SVG-PDA 2007, restented 2009.NSTEMI with PCI to SVG- PDA complicated by no-reflow/inferior infarction in 2012. c. Redo CABG 2015 with SVG-PD, SVG-D1, SVG-OM1.  . Chronic combined systolic and diastolic CHF (congestive heart failure)     a. Prior EF 45-50% in 08/2014. b. Improved  EF >55% in 09/2014 at [facility].  . Type II diabetes mellitus   . Anemia     a. + FOBT 08/2014.  Marland Kitchen History of hiatal hernia   . Daily headache   . Arthritis   . Chronic lower back pain   . CKD (chronic kidney disease) stage 3, GFR 30-59 ml/min   . Skin cancer   . Polymyalgia rheumatica   . SVT (supraventricular tachycardia)   . Atrial fibrillation   . Carotid artery occlusion     a. Duplex 12/2013: mild plaque bilat, 40-59% BICA. F/u due 12/2014.  Marland Kitchen Atherosclerosis of native arteries of the extremities with intermittent claudication     a. L external iliac stent 2001, bilateral SFA occlusions documented 2001.  Marland Kitchen OSA (obstructive sleep apnea)     "don't wear mask"   . Hypotonic bladder     Requiring in and out catheterization performed by the patient at home - occasional blood tinged urine (self-cath's daily for the last 2 years) and hematuria is chronic for him, followed by urology.  . MI (myocardial infarction) 1999-02/2014    total of 7 MI per patient  . COPD (chronic obstructive pulmonary disease)     pt states he doesn't have  . UTI (urinary tract infection) on cipro    Past Surgical History  Procedure Laterality Date  . Coronary stent placement      "I've had 8 put in; I presently have none" (08/26/2014)  .  Cardiac catheterization    . Intraoperative transesophageal echocardiogram N/A 03/01/2014    Procedure: INTRAOPERATIVE TRANSESOPHAGEAL ECHOCARDIOGRAM;  Surgeon: Gaye Pollack, Potter;  Location: [facility] OR;  Service: Open Heart Surgery;  Laterality: N/A;  . Anterior cervical decomp/discectomy fusion    . Ankle fracture surgery Right ~ 1977    "10 plates and 7 screws placed in it"  . Tonsillectomy    . Cataract extraction w/ intraocular lens  implant, bilateral Bilateral 2000's  . Mohs surgery  X 2    "nose; nose"  . Skin cancer excision      "all over my head; ears; back  . Esophagogastroduodenoscopy (egd) with esophageal dilation  X 1  . Coronary artery bypass graft  1999  .  Coronary artery bypass graft N/A 03/01/2014    Procedure: REDO CORONARY ARTERY BYPASS GRAFTING TIMES THREE, USING RIGHT GREATER SAPHENOUS VEIN VIA ENDOVEIN HARVEST.;  Surgeon: Gaye Pollack, Potter;  Location: [place] OR;  Service: Open Heart Surgery;  Laterality: N/A;  . Left heart catheterization with coronary/graft angiogram  02/26/2014    Procedure: LEFT HEART CATHETERIZATION WITH Beatrix Fetters;  Surgeon: [name];  Location: [facility] CATH LAB;  Service: Cardiovascular;;  . Left heart catheterization with coronary/graft angiogram N/A 07/23/2014    Procedure: LEFT HEART CATHETERIZATION WITH Beatrix Fetters;  Surgeon: [name];  Location: [facility] CATH LAB;  Service: Cardiovascular;  Laterality: N/A;  . Left heart catheterization with coronary/graft angiogram N/A 08/30/2014    Procedure: LEFT HEART CATHETERIZATION WITH Beatrix Fetters;  Surgeon: Leonie Man, Potter;  Location: [facility] CATH LAB;  Service: Cardiovascular;  Laterality: N/A;  . Abdominal aortagram N/A 11/27/2014    Procedure: ABDOMINAL Maxcine Ham;  Surgeon: Wellington Hampshire, Potter;  Location: [place] CATH LAB;  Service: Cardiovascular;  Laterality: N/A;  . Multiple tooth extractions    . Colonoscopy    . Endarterectomy Left 01/30/2015    Procedure: ENDARTERECTOMY CAROTID WITH PRIMARY CLOSURE OF ARTERY;  Surgeon: Angelia Mould, Potter;  Location: [place];  Service: Vascular;  Laterality: Left;  . Back surgery      neck surgery x 2  . Fracture surgery Right     ankle  . Cystoscopy w/ retrogrades Bilateral 03/03/2015    Procedure: CYSTOSCOPY WITH LEFT RETROGRADE PYELOGRAM;  Surgeon: Kathie Rhodes, Potter;  Location: [facility];  Service: Urology;  Laterality: Bilateral;  . Transurethral resection of bladder tumor with gyrus (turbt-gyrus) N/A 03/03/2015    Procedure: TRANSURETHRAL RESECTION OF BLADDER TUMOR;  Surgeon: Kathie Rhodes, Potter;  Location: [facility];  Service: Urology;  Laterality: N/A;     Current Outpatient  Prescriptions  Medication Sig Dispense Refill  . amLODipine (NORVASC) 10 MG tablet Take 1 tablet (10 mg total) by mouth daily.    Marland Kitchen atorvastatin (LIPITOR) 20 MG tablet Take 20 mg by mouth daily at 6 PM.     . Cranberry 500 MG CAPS Take 1 capsule by mouth at bedtime.    . ferrous sulfate 325 (65 FE) MG tablet Take 325 mg by mouth every evening.     Marland Kitchen glipiZIDE (GLUCOTROL) 10 MG tablet Take 1 tablet (10 mg total) by mouth 2 (two) times daily before a meal. 60 tablet 0  . hydrALAZINE (APRESOLINE) 50 MG tablet Take 50 mg by mouth 3 (three) times daily as needed (Will only take when blood pressure is running high. Will at least take once daily).     . Insulin Glargine (LANTUS) 100 UNIT/ML Solostar Pen Inject  10 Units into the skin every morning. (Patient taking differently: Inject 10 Units into the skin daily with breakfast. ) 15 mL 1  . Insulin Pen Needle (FIFTY50 PEN NEEDLES) 31G X 5 MM MISC As directed 30 each 1  . isosorbide mononitrate (IMDUR) 120 MG 24 hr tablet TAKE 1 TABLET BY MOUTH EVERY DAY 30 tablet 6  . metoprolol (LOPRESSOR) 50 MG tablet Take 1 tablet (50 mg total) by mouth 2 (two) times daily. 60 tablet 11  . nitroGLYCERIN (NITROSTAT) 0.4 MG SL tablet Place 0.4 mg under the tongue every 5 (five) minutes as needed for chest pain.    . pantoprazole (PROTONIX) 40 MG tablet Take 1 tablet (40 mg total) by mouth daily. 90 tablet 3  . torsemide (DEMADEX) 20 MG tablet Take 2 tablets by mouth once a day    . aspirin EC 81 MG EC tablet Take 1 tablet (81 mg total) by mouth daily. (Patient not taking: Reported on 04/15/2015) 1 tablet 0  . clopidogrel (PLAVIX) 75 MG tablet Take 1 tablet (75 mg total) by mouth daily. (Patient not taking: Reported on 04/01/2015) 90 tablet 3   No current facility-administered medications for this visit.    Allergies:   Review of patient's allergies indicates no known allergies.    Social History:  The patient  reports that he quit smoking about 13 months ago. His  smoking use included Cigarettes. He has a 50 pack-year smoking history. He has never used smokeless tobacco. He reports that he does not drink alcohol or use illicit drugs.   Family History:  The patient's family history includes CAD in his mother; Cirrhosis in his brother; Heart attack in his father; Heart disease in his father and mother; Hyperlipidemia in his father; Hypertension in his father and mother.    ROS:  Please see the history of present illness.   Otherwise, review of systems are positive for difficulty urinating, chronic back discomfort, occasional palpitations..   All other systems are reviewed and negative.    PHYSICAL EXAM: VS:  BP 146/50 mmHg  Pulse 72  Ht 5\' 6"  (1.676 m)  Wt 73.211 kg (161 lb 6.4 oz)  BMI 26.06 kg/m2  SpO2 97% , BMI Body mass index is 26.06 kg/(m^2). GEN: Well nourished, well developed, in no acute distress HEENT: normal Neck: no JVD, carotid bruits, or masses Cardiac: RRR; there is a 1/6 systolic murmur, no rubs, or gallops,no edema  Respiratory:  clear to auscultation bilaterally, normal work of breathing GI: soft, nontender, nondistended, + BS MS: no deformity or atrophy Skin: warm and dry, no rash Neuro:  Strength and sensation are intact Psych: euthymic mood, full affect   EKG:  EKG is not ordered today.    Recent Labs: 08/27/2014: Pro B Natriuretic peptide (BNP) 2854.0* 08/30/2014: TSH 2.860 09/02/2014: Magnesium 2.0 01/22/2015: ALT 37 02/25/2015: BUN 44*; Creatinine, Ser 1.86*; Hemoglobin 12.9*; Platelets 195; Potassium 4.3; Sodium 140    Lipid Panel    Component Value Date/Time   CHOL 133 09/03/2014 0403   TRIG 138 09/03/2014 0403   HDL 41 09/03/2014 0403   CHOLHDL 3.2 09/03/2014 0403   VLDL 28 09/03/2014 0403   LDLCALC 64 09/03/2014 0403      Wt Readings from Last 3 Encounters:  04/15/15 73.211 kg (161 lb 6.4 oz)  04/01/15 72.938 kg (160 lb 12.8 oz)  03/16/15 70.308 kg (155 lb)      Other studies  Reviewed: Additional studies/ records that were reviewed today include: .  ASSESSMENT AND PLAN:  1. Coronary artery disease involving coronary bypass graft of native heart without angina pectoris No angina  2. Chronic diastolic heart failure No volume overload  3. Bladder cancer without bleeding Upcoming surgery with [name]  4. Essential hypertension Controlled  5. Hyperlipidemia On therapy  6. Atherosclerosis of native arteries of extremity with intermittent claudication Asymptomatic  7. Tobacco use disorder Discontinued    Current medicines are reviewed at length with the patient today.  The patient does not have concerns regarding medicines.  The following changes have been made:  Should hold aspirin and Plavix for at least a week after the next bladder surgery.  Labs/ tests ordered today include:  No orders of the defined types were placed in this encounter.     Disposition:   FU with HS in 6 months  Signed, [name]  04/15/2015 9:31 AM    [facility] [place], [place], [place]  [address] Phone: [phone]; Fax: [phone]

## 2015-04-23 NOTE — Patient Instructions (Addendum)
YOUR PROCEDURE IS SCHEDULED ON : 04/28/15  REPORT TO [facility] MAIN ENTRANCE FOLLOW SIGNS TO EAST ELEVATOR - GO TO 3rd FLOOR CHECK IN AT 3 EAST NURSES STATION (SHORT STAY) AT:  10:00 AM  CALL THIS NUMBER IF YOU HAVE PROBLEMS THE MORNING OF SURGERY [phone]  REMEMBER:ONLY 1 PER PERSON MAY GO TO SHORT STAY WITH YOU TO GET READY THE MORNING OF YOUR SURGERY  DO NOT EAT FOOD OR DRINK LIQUIDS AFTER MIDNIGHT  TAKE THESE MEDICINES THE MORNING OF SURGERY:  AMLODIPINE / ISOSORBIDE / METOPROLOL / PROTONIX  MAY HAVE CLEAR LIQUIDS UNTIL 6:00 AM  YOU MAY NOT HAVE ANY METAL ON YOUR BODY INCLUDING HAIR PINS AND PIERCING'S. DO NOT WEAR JEWELRY, MAKEUP, LOTIONS, POWDERS OR PERFUMES. DO NOT WEAR NAIL POLISH. DO NOT SHAVE 48 HRS PRIOR TO SURGERY. MEN MAY SHAVE FACE AND NECK.  DO NOT [name] IS NOT RESPONSIBLE FOR VALUABLES.  CONTACTS, DENTURES OR PARTIALS MAY NOT BE WORN TO SURGERY. LEAVE SUITCASE IN CAR. CAN BE BROUGHT TO ROOM AFTER SURGERY.  PATIENTS DISCHARGED THE DAY OF SURGERY WILL NOT BE ALLOWED TO DRIVE HOME.  PLEASE READ OVER THE FOLLOWING INSTRUCTION SHEETS _________________________________________________________________________________                                          [facility] - PREPARING FOR SURGERY  Before surgery, you can play an important role.  Because skin is not sterile, your skin needs to be as free of germs as possible.  You can reduce the number of germs on your skin by washing with CHG (chlorahexidine gluconate) soap before surgery.  CHG is an antiseptic cleaner which kills germs and bonds with the skin to continue killing germs even after washing. Please DO NOT use if you have an allergy to CHG or antibacterial soaps.  If your skin becomes reddened/irritated stop using the CHG and inform your nurse when you arrive at Short Stay. Do not shave (including legs and underarms) for at least 48 hours prior to the first  CHG shower.  You may shave your face. Please follow these instructions carefully:   1.  Shower with CHG Soap the night before surgery and the  morning of Surgery.   2.  If you choose to wash your hair, wash your hair first as usual with your  normal  Shampoo.   3.  After you shampoo, rinse your hair and body thoroughly to remove the  shampoo.                                         4.  Use CHG as you would any other liquid soap.  You can apply chg directly  to the skin and wash . Gently wash with scrungie or clean wascloth    5.  Apply the CHG Soap to your body ONLY FROM THE NECK DOWN.   Do not use on open                           Wound or open sores. Avoid contact with eyes, ears mouth and genitals (private parts).  Genitals (private parts) with your normal soap.              6.  Wash thoroughly, paying special attention to the area where your surgery  will be performed.   7.  Thoroughly rinse your body with warm water from the neck down.   8.  DO NOT shower/wash with your normal soap after using and rinsing off  the CHG Soap .                9.  Pat yourself dry with a clean towel.             10.  Wear clean night clothes to bed after shower             11.  Place clean sheets on your bed the night of your first shower and do not  sleep with pets.  Day of Surgery : Do not apply any lotions/deodorants the morning of surgery.  Please wear clean clothes to the hospital/surgery center.  FAILURE TO FOLLOW THESE INSTRUCTIONS MAY RESULT IN THE CANCELLATION OF YOUR SURGERY    PATIENT SIGNATURE_________________________________  ______________________________________________________________________             CLEAR LIQUID DIET   Foods Allowed                                                                     Foods Excluded  Coffee and tea, regular and decaf                             liquids that you cannot  Plain Jell-O in any flavor                                              see through such as: Fruit ices (not with fruit pulp)                                     milk, soups, orange juice  Iced Popsicles                                                All solid food Carbonated beverages, regular and diet                                    Cranberry, grape and apple juices Sports drinks like Gatorade Lightly seasoned clear broth or consume(fat free) Sugar, honey syrup   _____________________________________________________________________

## 2015-04-24 ENCOUNTER — Encounter (HOSPITAL_COMMUNITY)
Admission: RE | Admit: 2015-04-24 | Discharge: 2015-04-24 | Disposition: A | Discharge status: Home / Self Care | Payer: Medicare Other | Source: Ambulatory Visit | Attending: Urology | Admitting: Urology

## 2015-04-24 ENCOUNTER — Other Ambulatory Visit: Payer: Self-pay

## 2015-04-24 ENCOUNTER — Encounter (HOSPITAL_COMMUNITY): Payer: Self-pay

## 2015-04-24 DIAGNOSIS — I2510 Atherosclerotic heart disease of native coronary artery without angina pectoris: Secondary | ICD-10-CM | POA: Insufficient documentation

## 2015-04-24 DIAGNOSIS — I10 Essential (primary) hypertension: Secondary | ICD-10-CM | POA: Insufficient documentation

## 2015-04-24 DIAGNOSIS — Z01810 Encounter for preprocedural cardiovascular examination: Secondary | ICD-10-CM | POA: Diagnosis not present

## 2015-04-24 DIAGNOSIS — C679 Malignant neoplasm of bladder, unspecified: Secondary | ICD-10-CM | POA: Insufficient documentation

## 2015-04-24 DIAGNOSIS — Z01812 Encounter for preprocedural laboratory examination: Secondary | ICD-10-CM | POA: Diagnosis not present

## 2015-04-24 HISTORY — DX: Other specified health status: Z78.9

## 2015-04-24 HISTORY — DX: Personal history of other diseases of the musculoskeletal system and connective tissue: Z87.39

## 2015-04-24 HISTORY — DX: Malignant neoplasm of bladder, unspecified: C67.9

## 2015-04-24 LAB — BASIC METABOLIC PANEL
Anion gap: 9 (ref 5–15)
BUN: 35 mg/dL — AB (ref 6–20)
CALCIUM: 10 mg/dL (ref 8.9–10.3)
CO2: 31 mmol/L (ref 22–32)
Chloride: 99 mmol/L — ABNORMAL LOW (ref 101–111)
Creatinine, Ser: 1.65 mg/dL — ABNORMAL HIGH (ref 0.61–1.24)
GFR calc Af Amer: 46 mL/min — ABNORMAL LOW (ref >60)
GFR calc non Af Amer: 40 mL/min — ABNORMAL LOW (ref >60)
GLUCOSE: 210 mg/dL — AB (ref 65–99)
Potassium: 5.3 mmol/L — ABNORMAL HIGH (ref 3.5–5.1)
Sodium: 139 mmol/L (ref 135–145)

## 2015-04-24 LAB — CBC
HCT: 40.5 % (ref 39.0–52.0)
HEMOGLOBIN: 13.3 g/dL (ref 13.0–17.0)
MCH: 27.5 pg (ref 26.0–34.0)
MCHC: 32.8 g/dL (ref 30.0–36.0)
MCV: 83.7 fL (ref 78.0–100.0)
PLATELETS: 239 10*3/uL (ref 150–400)
RBC: 4.84 MIL/uL (ref 4.22–5.81)
RDW: 13.2 % (ref 11.5–15.5)
WBC: 9.8 10*3/uL (ref 4.0–10.5)

## 2015-04-24 NOTE — Progress Notes (Signed)
Abnormal BMET faxed to Dr. Ottelin 

## 2015-04-28 ENCOUNTER — Ambulatory Visit (HOSPITAL_COMMUNITY)
Admission: RE | Admit: 2015-04-28 | Discharge: 2015-04-28 | Disposition: A | Discharge status: Home / Self Care | Payer: Medicare Other | Source: Ambulatory Visit | Attending: Urology | Admitting: Urology

## 2015-04-28 ENCOUNTER — Encounter (HOSPITAL_COMMUNITY)
Admission: RE | Disposition: A | Discharge status: Home / Self Care | Payer: Self-pay | Source: Ambulatory Visit | Attending: Urology

## 2015-04-28 ENCOUNTER — Ambulatory Visit (HOSPITAL_COMMUNITY): Payer: Medicare Other | Admitting: Certified Registered Nurse Anesthetist

## 2015-04-28 ENCOUNTER — Encounter (HOSPITAL_COMMUNITY): Payer: Self-pay | Admitting: *Deleted

## 2015-04-28 DIAGNOSIS — I739 Peripheral vascular disease, unspecified: Secondary | ICD-10-CM | POA: Diagnosis not present

## 2015-04-28 DIAGNOSIS — M1990 Unspecified osteoarthritis, unspecified site: Secondary | ICD-10-CM | POA: Diagnosis not present

## 2015-04-28 DIAGNOSIS — E780 Pure hypercholesterolemia: Secondary | ICD-10-CM | POA: Insufficient documentation

## 2015-04-28 DIAGNOSIS — I2510 Atherosclerotic heart disease of native coronary artery without angina pectoris: Secondary | ICD-10-CM | POA: Diagnosis not present

## 2015-04-28 DIAGNOSIS — J449 Chronic obstructive pulmonary disease, unspecified: Secondary | ICD-10-CM | POA: Diagnosis not present

## 2015-04-28 DIAGNOSIS — R339 Retention of urine, unspecified: Secondary | ICD-10-CM | POA: Insufficient documentation

## 2015-04-28 DIAGNOSIS — R310 Gross hematuria: Secondary | ICD-10-CM | POA: Diagnosis not present

## 2015-04-28 DIAGNOSIS — R300 Dysuria: Secondary | ICD-10-CM | POA: Insufficient documentation

## 2015-04-28 DIAGNOSIS — N323 Diverticulum of bladder: Secondary | ICD-10-CM | POA: Insufficient documentation

## 2015-04-28 DIAGNOSIS — K219 Gastro-esophageal reflux disease without esophagitis: Secondary | ICD-10-CM | POA: Insufficient documentation

## 2015-04-28 DIAGNOSIS — Z7902 Long term (current) use of antithrombotics/antiplatelets: Secondary | ICD-10-CM | POA: Diagnosis not present

## 2015-04-28 DIAGNOSIS — I509 Heart failure, unspecified: Secondary | ICD-10-CM | POA: Insufficient documentation

## 2015-04-28 DIAGNOSIS — N312 Flaccid neuropathic bladder, not elsewhere classified: Secondary | ICD-10-CM | POA: Diagnosis not present

## 2015-04-28 DIAGNOSIS — Z794 Long term (current) use of insulin: Secondary | ICD-10-CM | POA: Diagnosis not present

## 2015-04-28 DIAGNOSIS — Z7982 Long term (current) use of aspirin: Secondary | ICD-10-CM | POA: Diagnosis not present

## 2015-04-28 DIAGNOSIS — I252 Old myocardial infarction: Secondary | ICD-10-CM | POA: Insufficient documentation

## 2015-04-28 DIAGNOSIS — C678 Malignant neoplasm of overlapping sites of bladder: Secondary | ICD-10-CM | POA: Diagnosis not present

## 2015-04-28 DIAGNOSIS — E119 Type 2 diabetes mellitus without complications: Secondary | ICD-10-CM | POA: Diagnosis not present

## 2015-04-28 DIAGNOSIS — I10 Essential (primary) hypertension: Secondary | ICD-10-CM | POA: Insufficient documentation

## 2015-04-28 DIAGNOSIS — Z87891 Personal history of nicotine dependence: Secondary | ICD-10-CM | POA: Diagnosis not present

## 2015-04-28 DIAGNOSIS — C679 Malignant neoplasm of bladder, unspecified: Secondary | ICD-10-CM

## 2015-04-28 HISTORY — PX: TRANSURETHRAL RESECTION OF BLADDER TUMOR WITH GYRUS (TURBT-GYRUS): SHX6458

## 2015-04-28 LAB — GLUCOSE, CAPILLARY
Glucose-Capillary: 190 mg/dL — ABNORMAL HIGH (ref 65–99)
Glucose-Capillary: 142 mg/dL — ABNORMAL HIGH (ref 65–99)
Glucose-Capillary: 135 mg/dL — ABNORMAL HIGH (ref 65–99)

## 2015-04-28 SURGERY — TRANSURETHRAL RESECTION OF BLADDER TUMOR WITH GYRUS (TURBT-GYRUS)
Anesthesia: General

## 2015-04-28 MED ORDER — FENTANYL CITRATE (PF) 250 MCG/5ML IJ SOLN
INTRAMUSCULAR | Status: AC
  Filled 2015-04-28: qty 5

## 2015-04-28 MED ORDER — CIPROFLOXACIN IN D5W 400 MG/200ML IV SOLN
400.0000 mg | INTRAVENOUS | Status: AC
  Administered 2015-04-28: 400 mg via INTRAVENOUS

## 2015-04-28 MED ORDER — FENTANYL CITRATE (PF) 100 MCG/2ML IJ SOLN
25.0000 ug | INTRAMUSCULAR | Status: DC | PRN
  Administered 2015-04-28 (×2): 50 ug via INTRAVENOUS

## 2015-04-28 MED ORDER — FENTANYL CITRATE (PF) 100 MCG/2ML IJ SOLN
INTRAMUSCULAR | Status: AC
  Filled 2015-04-28: qty 2

## 2015-04-28 MED ORDER — CIPROFLOXACIN IN D5W 400 MG/200ML IV SOLN
INTRAVENOUS | Status: AC
  Filled 2015-04-28: qty 200

## 2015-04-28 MED ORDER — LACTATED RINGERS IV SOLN
INTRAVENOUS | Status: DC
  Administered 2015-04-28: 1000 mL via INTRAVENOUS
  Administered 2015-04-28 (×2): via INTRAVENOUS

## 2015-04-28 MED ORDER — PROPOFOL 10 MG/ML IV BOLUS
INTRAVENOUS | Status: AC
  Filled 2015-04-28: qty 20

## 2015-04-28 MED ORDER — OXYCODONE HCL 5 MG/5ML PO SOLN
5.0000 mg | Freq: Once | ORAL | Status: DC | PRN
Start: 2015-04-28 — End: 2015-04-28
  Filled 2015-04-28: qty 5

## 2015-04-28 MED ORDER — PHENAZOPYRIDINE HCL 200 MG PO TABS
200.0000 mg | ORAL_TABLET | Freq: Once | ORAL | Status: AC
  Administered 2015-04-28: 200 mg via ORAL

## 2015-04-28 MED ORDER — OXYCODONE HCL 5 MG PO TABS: 5.0000 mg | ORAL_TABLET | Freq: Once | ORAL | Status: DC | PRN

## 2015-04-28 MED ORDER — HYDROCODONE-ACETAMINOPHEN 10-325 MG PO TABS: 1.0000 | ORAL_TABLET | ORAL | Status: DC | PRN

## 2015-04-28 MED ORDER — TAMSULOSIN HCL 0.4 MG PO CAPS
0.4000 mg | ORAL_CAPSULE | Freq: Once | ORAL | Status: AC
  Administered 2015-04-28: 0.4 mg via ORAL
  Filled 2015-04-28: qty 1

## 2015-04-28 MED ORDER — ONDANSETRON HCL 4 MG/2ML IJ SOLN: 4.0000 mg | Freq: Four times a day (QID) | INTRAMUSCULAR | Status: DC | PRN

## 2015-04-28 MED ORDER — PHENAZOPYRIDINE HCL 200 MG PO TABS: 200.0000 mg | ORAL_TABLET | Freq: Three times a day (TID) | ORAL | Status: DC | PRN

## 2015-04-28 MED ORDER — PHENAZOPYRIDINE HCL 200 MG PO TABS
ORAL_TABLET | ORAL | Status: AC
  Filled 2015-04-28: qty 1

## 2015-04-28 MED ORDER — SODIUM CHLORIDE 0.9 % IR SOLN
Status: DC | PRN
  Administered 2015-04-28: 21000 mL
  Administered 2015-04-28: 3000 mL

## 2015-04-28 MED ORDER — PROPOFOL 10 MG/ML IV BOLUS
INTRAVENOUS | Status: DC | PRN
  Administered 2015-04-28: 150 mg via INTRAVENOUS

## 2015-04-28 MED ORDER — ONDANSETRON HCL 4 MG/2ML IJ SOLN
INTRAMUSCULAR | Status: DC | PRN
  Administered 2015-04-28: 4 mg via INTRAVENOUS

## 2015-04-28 MED ORDER — LIDOCAINE HCL (CARDIAC) 20 MG/ML IV SOLN
INTRAVENOUS | Status: DC | PRN
  Administered 2015-04-28: 50 mg via INTRAVENOUS

## 2015-04-28 MED ORDER — FENTANYL CITRATE (PF) 100 MCG/2ML IJ SOLN
INTRAMUSCULAR | Status: DC | PRN
  Administered 2015-04-28 (×2): 50 ug via INTRAVENOUS

## 2015-04-28 SURGICAL SUPPLY — 12 items
BAG URINE DRAINAGE (UROLOGICAL SUPPLIES) ×1 IMPLANT
BAG URO CATCHER STRL LF (DRAPE) ×2 IMPLANT
EVACUATOR MICROVAS BLADDER (UROLOGICAL SUPPLIES) ×1 IMPLANT
GLOVE BIOGEL M 8.0 STRL (GLOVE) ×4 IMPLANT
GOWN STRL REUS W/TWL XL LVL3 (GOWN DISPOSABLE) ×4 IMPLANT
HOLDER FOLEY CATH W/STRAP (MISCELLANEOUS) ×1 IMPLANT
KIT ASPIRATION TUBING (SET/KITS/TRAYS/PACK) ×1 IMPLANT
LOOP CUT BIPOLAR 24F LRG (ELECTROSURGICAL) ×2 IMPLANT
MANIFOLD NEPTUNE II (INSTRUMENTS) ×2 IMPLANT
NS IRRIG 1000ML POUR BTL (IV SOLUTION) ×2 IMPLANT
PACK CYSTO (CUSTOM PROCEDURE TRAY) ×2 IMPLANT
TUBING CONNECTING 10 (TUBING) ×2 IMPLANT

## 2015-04-28 NOTE — Transfer of Care (Signed)
Immediate Anesthesia Transfer of Care Note  Patient: [name]  Procedure(s) Performed: Procedure(s): TRANSURETHRAL RESECTION OF BLADDER TUMOR  (N/A)  Patient Location: PACU  Anesthesia Type:General  Level of Consciousness: awake, alert  and oriented  Airway & Oxygen Therapy: Patient Spontanous Breathing and Patient connected to face mask oxygen  Post-op Assessment: Report given to RN and Post -op Vital signs reviewed and stable  Post vital signs: Reviewed and stable  Last Vitals:  Filed Vitals:   04/28/15 0957  BP: 134/62  Pulse: 61  Temp: 36.4 C  Resp: 16    Complications: No apparent anesthesia complications

## 2015-04-28 NOTE — Op Note (Signed)
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 It involved a bladder diverticulum on the floor of the bladder on the right-hand side as well as extensive portions of the bladder on the floor on the right-hand side extending onto the wall and posteriorly on that side. The pathology was T1,G3 and he is brought back to the operating room for repeat resection since I was unable to resect all of the tumor from the diverticulum.  Description of operation: The patient was taken to the operating room and administered general anesthesia. He was then placed on the table and moved to the dorsal lithotomy position after which his genitalia was sterilely prepped and draped. An official timeout was then performed.  The 69 French resectoscope with the 30 lens and visual obturator were then passed down the urethra under direct visualization. Urethra appeared normal. The visual obturator was then removed and the Gyrus resectoscope with 30  lens was then inserted and the bladder was fully and systematically inspected.  The left ureteral orifice was noted to be of normal configuration and position. The right ureteral orifice was not identified nor was it identified at the initial time of resection but a follow-up CT scan after his first resection revealed no evidence of  hydronephrosis on the right-hand side.  I first began by resecting abnormal appearing mucosa lateral to the diverticulum on the right floor of the bladder extending up onto the posterior wall and right wall. I resected and a very shallow manner that resected a very large area of the bladder that appeared to be involved with abnormal appearing early papillary configuration mucosa. I resected on over to about the midline and then resected an area that appeared to be an obvious papillary tumor involving the lateral wall near the bladder neck and extending up to about the 10:00 position.  I then began resecting the tumor that was obviously involving the entire inside surface of the diverticulum. Fortunately the diverticulum appeared to be packed by muscle rather than just thin mucosa only and I was able to resect circumferentially from the back of the diverticulum on out through the neck of the diverticulum and was able to resect every bit of obvious tumor that involved the entire inside walls of the bladder 360 around however I found it would be difficult to resect the posterior wall also a small round area on the posterior wall that appeared abnormal was completely fulgurated. In performing this maneuver I was able to resect what appeared to be all visible tumor within his bladder. Reinspection of the bladder revealed all obvious tumor had been fully resected and there was no evidence of perforation. The Microvasive evacuator was then used to irrigate the bladder and remove all of the portions of bladder tumor which were sent to pathology. I then removed the resectoscope.  He had difficulty with clot retention and did not tolerate a catheter well last  time and since there was no bleeding whatsoever at the end of the procedure I elected not catheter in.    PLAN OF CARE: Discharge to home after PACU  PATIENT DISPOSITION:  PACU - hemodynamically stable.

## 2015-04-28 NOTE — Anesthesia Procedure Notes (Signed)
Procedure Name: LMA Insertion Performed by: Marigene Erler J Pre-anesthesia Checklist: Patient identified, Emergency Drugs available, Suction available, Patient being monitored and Timeout performed Patient Re-evaluated:Patient Re-evaluated prior to inductionOxygen Delivery Method: Circle system utilized Preoxygenation: Pre-oxygenation with 100% oxygen Intubation Type: IV induction Ventilation: Mask ventilation without difficulty LMA: LMA inserted LMA Size: 4.0 Number of attempts: 1 Placement Confirmation: positive ETCO2,  CO2 detector and breath sounds checked- equal and bilateral Tube secured with: Tape Dental Injury: Teeth and Oropharynx as per pre-operative assessment        

## 2015-04-28 NOTE — Discharge Instructions (Signed)
Transurethral Resection of Bladder Tumor (TURBT)   Definition:  Transurethral Resection of the Bladder Tumor is a surgical procedure used to diagnose and remove tumors within the bladder. TURBT is the most common treatment for early stage bladder cancer.  General instructions:     Your recent bladder surgery requires very little post hospital care but some definite precautions.  Despite the fact that no skin incisions were used, the area around the bladder incisions are raw and covered with scabs to promote healing and prevent bleeding. Certain precautions are needed to insure that the scabs are not disturbed over the next 2-4 weeks while the healing proceeds.  Because the raw surface inside your bladder and the irritating effects of urine you may expect frequency of urination and/or urgency (a stronger desire to urinate) and perhaps even getting up at night more often. This will usually resolve or improve slowly over the healing period. You may see some blood in your urine over the first 6 weeks. Do not be alarmed, even if the urine was clear for a while. Get off your feet and drink lots of fluids until clearing occurs. If you start to pass clots or don't improve call us.  Catheter: (If you are discharged with a catheter.)  1. Keep your catheter secured to your leg at all times with tape or the supplied strap. 2. You may experience leakage of urine around your catheter- as long as the  catheter continues to drain, this is normal.  If your catheter stops draining  go to the ER. 3. You may also have blood in your urine, even after it has been clear for  several days; you may even pass some small blood clots or other material.  This  is normal as well.  If this happens, sit down and drink plenty of water to help  make urine to flush out your bladder.  If the blood in your urine becomes worse  after doing this, contact our office or return to the ER. 4. You may use the leg bag (small bag)  during the day, but use the large bag at  night.  Diet:  You may return to your normal diet immediately. Because of the raw surface of your bladder, alcohol, spicy foods, foods high in acid and drinks with caffeine may cause irritation or frequency and should be used in moderation. To keep your urine flowing freely and avoid constipation, drink plenty of fluids during the day (8-10 glasses). Tip: Avoid cranberry juice because it is very acidic.  Activity:  Your physical activity doesn't need to be restricted. However, if you are very active, you may see some blood in the urine. We suggest that you reduce your activity under the circumstances until the bleeding has stopped.  Bowels:  It is important to keep your bowels regular during the postoperative period. Straining with bowel movements can cause bleeding. A bowel movement every other day is reasonable. Use a mild laxative if needed, such as milk of magnesia 2-3 tablespoons, or 2 Dulcolax tablets. Call if you continue to have problems. If you had been taking narcotics for pain, before, during or after your surgery, you may be constipated. Take a laxative if necessary.    Medication:  You should resume your pre-surgery medications unless told not to. In addition you may be given an antibiotic to prevent or treat infection. Antibiotics are not always necessary. All medication should be taken as prescribed until the bottles are finished unless you are having  an unusual reaction to one of the drugs.   General Anesthesia, Care After Refer to this sheet in the next few weeks. These instructions provide you with information on caring for yourself after your procedure. Your health care provider may also give you more specific instructions. Your treatment has been planned according to current medical practices, but problems sometimes occur. Call your health care provider if you have any problems or questions after your procedure. WHAT TO EXPECT  AFTER THE PROCEDURE After the procedure, it is typical to experience:  Sleepiness.  Nausea and vomiting. HOME CARE INSTRUCTIONS  For the first 24 hours after general anesthesia:  Have a responsible person with you.  Do not drive a car. If you are alone, do not take public transportation.  Do not drink alcohol.  Do not take medicine that has not been prescribed by your health care provider.  Do not sign important papers or make important decisions.  You may resume a normal diet and activities as directed by your health care provider.  Change bandages (dressings) as directed.  If you have questions or problems that seem related to general anesthesia, call the hospital and ask for the anesthetist or anesthesiologist on call. SEEK MEDICAL CARE IF:  You have nausea and vomiting that continue the day after anesthesia.  You develop a rash. SEEK IMMEDIATE MEDICAL CARE IF:   You have difficulty breathing.  You have chest pain.  You have any allergic problems. Document Released: 01/10/2001 Document Revised: 10/09/2013 Document Reviewed: 04/19/2013 [facility] Patient Information 2015 [place], [place]. This information is not intended to replace advice given to you by your health care provider. Make sure you discuss any questions you have with your health care provider.

## 2015-04-28 NOTE — H&P (Signed)
[name] is a 74 year old male with bladder cancer.   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 He was not a surgical candidate at that time so I placed him on medical management after I first saw him and noted his creatinine improved slightly and since then he says his urination has remained stable. He did see some improvement after starting the medication. Cystoscopy in 10/13 revealed no significant prostatic obstruction.      Prostate nodule: He has a nodule in the apex of the prostate on the left side that has remained unchanged over the years. In addition his PSA has been found to be completely normal and stable as well.    Gross hematuria: He was experiencing mild dysuria and was placed on empiric antibiotics with resolution of his hematuria. In 4/13 he underwent a full hematuria workup with CT scan and cystoscopy which were found to be negative. At that time my feeling was that his hematuria was secondary to a UTI.     Incomplete bladder emptying/urinary retention: He was experiencing dysuria, frequency and lower abdominal pain and was found to have a PVR of 468 cc. A Foley catheter was inserted.  Urodynamics 10/13: He was found to have a hypotonic, large capacity bladder with no significant evidence of outlet obstruction.  Treatment: CIC 4 times a day and prior to bed and tamsulosin. (Catheter requirement #200/month)     Transitional cell carcinoma of the bladder: Renal ultrasound done on 12/25/14 revealed no evidence of hydronephrosis or renal mass however an abnormality of the posterior wall was noted and found cystoscopically to be a bladder tumor.  TURBT 03/03/15: Extensive tumor was resected from the floor and right wall of the bladder including from a bladder diverticulum.  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 The Foley catheter in because of the large area that I had to resect. He ended up developing clot retention and having go to the emergency room but since that time his catheter has been draining. He is back on his Plavix. His urine is still slightly bloody but there are no clots now. He is anxious to get his catheter out.    Past Medical History Problems  1. History of Acute Myocardial Infarction 2. History of Arthritis 3. History of Coagulation Defects 4. History of diabetes mellitus (Z86.39) 5. History of heartburn (Z87.898) 6. History of hypercholesterolemia (Z86.39) 7. History of hypertension (Z86.79)  Surgical History Problems  1. History of Ankle Repair 2. History of Coronary Artery Triple Arterial Bypass Graft 3. History of Laminectomy Cervical  Current Meds 1. Acetaminophen 325 MG Oral Tablet;  Therapy: (Recorded:30Mar2015) to Recorded 2. AmLODIPine Besylate 10 MG Oral Tablet;  Therapy: (Recorded:25Apr2013) to Recorded 3. Aspirin EC Low Dose 81 MG Oral Tablet Delayed Release;  Therapy: (Recorded:29Mar2016) to Recorded 4. Atorvastatin Calcium 20 MG Oral Tablet;  Therapy: (Recorded:04Nov2015) to Recorded 5. Furosemide 20 MG Oral Tablet;  Therapy: (Recorded:04Nov2015) to Recorded 6. GlipiZIDE XL 10 MG Oral Tablet Extended Release 24 Hour;  Therapy: (Recorded:04Nov2015) to Recorded 7. HydrALAZINE HCl - 50 MG Oral Tablet;  Therapy: (Recorded:29Mar2016) to Recorded 8. Irbesartan 300 MG Oral Tablet;  Therapy: (Recorded:04Nov2015) to Recorded 9. Isosorbide Mononitrate 120 MG TBCR;  Therapy: (Recorded:29Mar2016) to Recorded 10. Lantus SoloStar 100 UNIT/ML SOLN;   Therapy: (Recorded:29Mar2016) to Recorded 11. Metoprolol Succinate ER 50 MG Oral Tablet Extended Release 24 Hour;  Therapy: (Recorded:25Apr2013) to Recorded 12. Multi Vitamin/Minerals TABS;   Therapy: (Recorded:29Mar2016) to Recorded 13. Nitrostat 0.4 MG Sublingual Tablet Sublingual;   Therapy:  (Recorded:25Apr2013) to Recorded 14. Pantoprazole Sodium 40 MG Oral Tablet Delayed Release;   Therapy: (Recorded:29Mar2016) to Recorded 15. Plavix 75 MG Oral Tablet;   Therapy: (Recorded:29Mar2016) to Recorded 16. Torsemide 20 MG Oral Tablet;   Therapy: (Recorded:29Mar2016) to Recorded 17. Vitamin D 1000 UNIT Oral Tablet;   Therapy: (Recorded:25Apr2013) to Recorded  Allergies Medication  1. No Known Drug Allergies  Social History Problems  1. Caffeine Use   2 2. Former smoker [id number]) 3. History of Tobacco Use   quit; smoked two packs per day for 50 years  Review of Systems Genitourinary and gastrointestinal system(s) were reviewed and pertinent findings if present are noted and are otherwise negative.  Genitourinary: no hematuria.    Vitals Vital Signs  Height: 5 ft 6 in Weight: 164 lb  BMI Calculated: 26.47 BSA Calculated: 1.84 Blood Pressure: 150 / 57 Heart Rate: 56  Physical Exam Constitutional: Well nourished and well developed . No acute distress.  ENT:. The ears and nose are normal in appearance.  Neck: The appearance of the neck is normal and no neck mass is present.  Pulmonary: No respiratory distress and normal respiratory rhythm and effort.  Cardiovascular: Heart rate and rhythm are normal . No peripheral edema.  Abdomen: The abdomen is soft and nontender. No masses are palpated. No CVA tenderness. No hernias are palpable. No hepatosplenomegaly noted.  Rectal: Rectal exam demonstrates normal sphincter tone, no tenderness and no masses. The prostate has no nodularity and is not tender. The left seminal vesicle is nonpalpable. The right seminal vesicle is nonpalpable. The perineum is normal on inspection.  Genitourinary: Examination of the penis demonstrates no discharge, no masses, no lesions and a normal meatus. The scrotum is without lesions. Examination of the right scrotum demonstrates a hydrocele. The right epididymis is palpably normal and non-tender. The  left epididymis is palpably normal and non-tender. The right testis is non-tender and without masses. The left testis is non-tender and without masses.  Lymphatics: The femoral and inguinal nodes are not enlarged or tender.  Skin: Normal skin turgor, no visible rash and no visible skin lesions.  Neuro/Psych:. Mood and affect are appropriate.   Assessment Assessed  1. Transitional cell carcinoma of bladder (C67.9) 2. 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 There did not appear to be any involvement of the muscularis. There was however a very large volume of disease and there is likely still disease present in his diverticulum. We discussed the options for further treatment. At this point at the very least he is going to require a repeat resection. The alternative would be to proceed with a radical cystectomy although he would like to try to avoid that. I told him that with a repeat resection he would then need to undergo an induction course of BCG and being maintained on maintenance BCG.  In addition a CT scan will need to be obtained to evaluate for metastatic disease although I think the likelihood of this is low I do need to get a look at the area of the diverticulum and this will also allow me to evaluate for any evidence of obstruction since his right ureteral orifice was not identified at the time of surgery.  CT scan revealed no evidence of metastatic disease and no evidence of obstruction.  Plan  He is scheduled for repeat transurethral resection.

## 2015-04-28 NOTE — Anesthesia Postprocedure Evaluation (Signed)
Anesthesia Post Note  Patient: [name]  Procedure(s) Performed: Procedure(s) (LRB): TRANSURETHRAL RESECTION OF BLADDER TUMOR  (N/A)  Anesthesia type: General  Patient location: PACU  Post pain: Pain level controlled and Adequate analgesia  Post assessment: Post-op Vital signs reviewed, Patient's Cardiovascular Status Stable, Respiratory Function Stable, Patent Airway and Pain level controlled  Last Vitals:  Filed Vitals:   04/28/15 1431  BP: 148/52  Pulse: 58  Temp: 36.5 C  Resp: 15    Post vital signs: Reviewed and stable  Level of consciousness: awake, alert  and oriented  Complications: No apparent anesthesia complications

## 2015-04-28 NOTE — Anesthesia Preprocedure Evaluation (Signed)
Anesthesia Evaluation  Patient identified by MRN, date of birth, ID band Patient awake    Reviewed: Allergy & Precautions, NPO status , Patient's Chart, lab work & pertinent test results  Airway Mallampati: II   Neck ROM: full    Dental   Pulmonary sleep apnea , COPDformer smoker,  breath sounds clear to auscultation        Cardiovascular hypertension, + CAD, + Past MI, + Cardiac Stents, + CABG, + Peripheral Vascular Disease and +CHF + dysrhythmias Rhythm:regular Rate:Normal     Neuro/Psych  Headaches,  Neuromuscular disease    GI/Hepatic hiatal hernia, GERD-  ,  Endo/Other  diabetes, Type 2  Renal/GU Renal InsufficiencyRenal disease     Musculoskeletal  (+) Arthritis -,   Abdominal   Peds  Hematology   Anesthesia Other Findings   Reproductive/Obstetrics                             Anesthesia Physical Anesthesia Plan  ASA: III  Anesthesia Plan: General   Post-op Pain Management:    Induction: Intravenous  Airway Management Planned: LMA  Additional Equipment:   Intra-op Plan:   Post-operative Plan:   Informed Consent: I have reviewed the patients History and Physical, chart, labs and discussed the procedure including the risks, benefits and alternatives for the proposed anesthesia with the patient or authorized representative who has indicated his/her understanding and acceptance.     Plan Discussed with: CRNA, Anesthesiologist and Surgeon  Anesthesia Plan Comments:         Anesthesia Quick Evaluation

## 2015-04-29 ENCOUNTER — Encounter (HOSPITAL_COMMUNITY): Payer: Self-pay | Admitting: Urology

## 2015-05-05 DIAGNOSIS — N183 Chronic kidney disease, stage 3 (moderate): Secondary | ICD-10-CM | POA: Diagnosis not present

## 2015-05-05 DIAGNOSIS — N2581 Secondary hyperparathyroidism of renal origin: Secondary | ICD-10-CM | POA: Diagnosis not present

## 2015-05-05 DIAGNOSIS — N189 Chronic kidney disease, unspecified: Secondary | ICD-10-CM | POA: Diagnosis not present

## 2015-05-12 DIAGNOSIS — N183 Chronic kidney disease, stage 3 (moderate): Secondary | ICD-10-CM | POA: Diagnosis not present

## 2015-05-12 DIAGNOSIS — I10 Essential (primary) hypertension: Secondary | ICD-10-CM | POA: Diagnosis not present

## 2015-05-12 DIAGNOSIS — N2581 Secondary hyperparathyroidism of renal origin: Secondary | ICD-10-CM | POA: Diagnosis not present

## 2015-05-12 DIAGNOSIS — C679 Malignant neoplasm of bladder, unspecified: Secondary | ICD-10-CM | POA: Diagnosis not present

## 2015-05-12 DIAGNOSIS — D631 Anemia in chronic kidney disease: Secondary | ICD-10-CM | POA: Diagnosis not present

## 2015-06-11 DIAGNOSIS — N390 Urinary tract infection, site not specified: Secondary | ICD-10-CM | POA: Diagnosis not present

## 2015-06-11 DIAGNOSIS — R8290 Unspecified abnormal findings in urine: Secondary | ICD-10-CM | POA: Diagnosis not present

## 2015-06-11 DIAGNOSIS — Z87440 Personal history of urinary (tract) infections: Secondary | ICD-10-CM | POA: Diagnosis not present

## 2015-06-25 DIAGNOSIS — C670 Malignant neoplasm of trigone of bladder: Secondary | ICD-10-CM | POA: Diagnosis not present

## 2015-06-25 DIAGNOSIS — N390 Urinary tract infection, site not specified: Secondary | ICD-10-CM | POA: Diagnosis not present

## 2015-06-25 DIAGNOSIS — Z5111 Encounter for antineoplastic chemotherapy: Secondary | ICD-10-CM | POA: Diagnosis not present

## 2015-07-02 DIAGNOSIS — C670 Malignant neoplasm of trigone of bladder: Secondary | ICD-10-CM | POA: Diagnosis not present

## 2015-07-03 DIAGNOSIS — R8290 Unspecified abnormal findings in urine: Secondary | ICD-10-CM | POA: Diagnosis not present

## 2015-07-07 DIAGNOSIS — N390 Urinary tract infection, site not specified: Secondary | ICD-10-CM | POA: Diagnosis not present

## 2015-07-07 DIAGNOSIS — C670 Malignant neoplasm of trigone of bladder: Secondary | ICD-10-CM | POA: Diagnosis not present

## 2015-07-07 DIAGNOSIS — Z5111 Encounter for antineoplastic chemotherapy: Secondary | ICD-10-CM | POA: Diagnosis not present

## 2015-07-14 DIAGNOSIS — B9620 Unspecified Escherichia coli [E. coli] as the cause of diseases classified elsewhere: Secondary | ICD-10-CM | POA: Diagnosis not present

## 2015-07-14 DIAGNOSIS — R339 Retention of urine, unspecified: Secondary | ICD-10-CM | POA: Diagnosis not present

## 2015-07-14 DIAGNOSIS — N390 Urinary tract infection, site not specified: Secondary | ICD-10-CM | POA: Diagnosis not present

## 2015-07-14 DIAGNOSIS — Z8551 Personal history of malignant neoplasm of bladder: Secondary | ICD-10-CM | POA: Diagnosis not present

## 2015-07-16 DIAGNOSIS — H4323 Crystalline deposits in vitreous body, bilateral: Secondary | ICD-10-CM | POA: Diagnosis not present

## 2015-07-16 DIAGNOSIS — Z961 Presence of intraocular lens: Secondary | ICD-10-CM | POA: Diagnosis not present

## 2015-07-16 DIAGNOSIS — E119 Type 2 diabetes mellitus without complications: Secondary | ICD-10-CM | POA: Diagnosis not present

## 2015-07-16 DIAGNOSIS — H40003 Preglaucoma, unspecified, bilateral: Secondary | ICD-10-CM | POA: Diagnosis not present

## 2015-07-21 DIAGNOSIS — C670 Malignant neoplasm of trigone of bladder: Secondary | ICD-10-CM | POA: Diagnosis not present

## 2015-07-21 IMAGING — US US RENAL
1 series · 14 of 25 positions shown · non-contrast
Comparison: None.

CLINICAL DATA: Chronic renal disease.

EXAM:
RENAL/URINARY TRACT ULTRASOUND COMPLETE

[Series 1: us renal · 0.28mm/px · 14 of 33 slices shown]
[im 1/33]
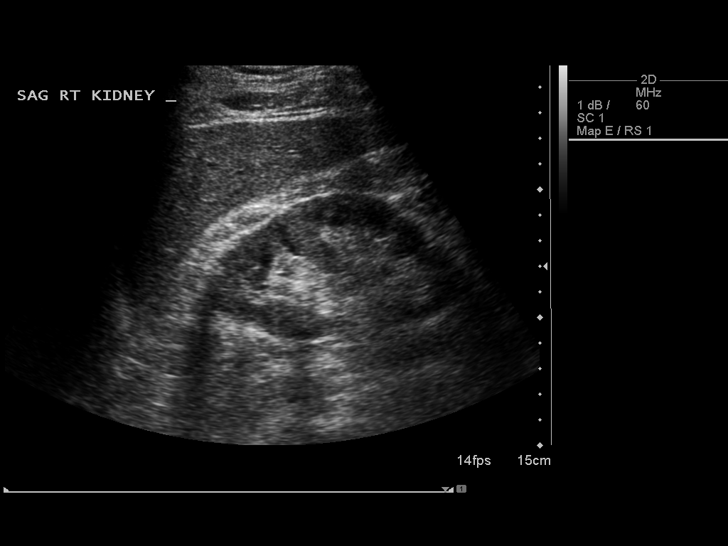
[im 3/33]
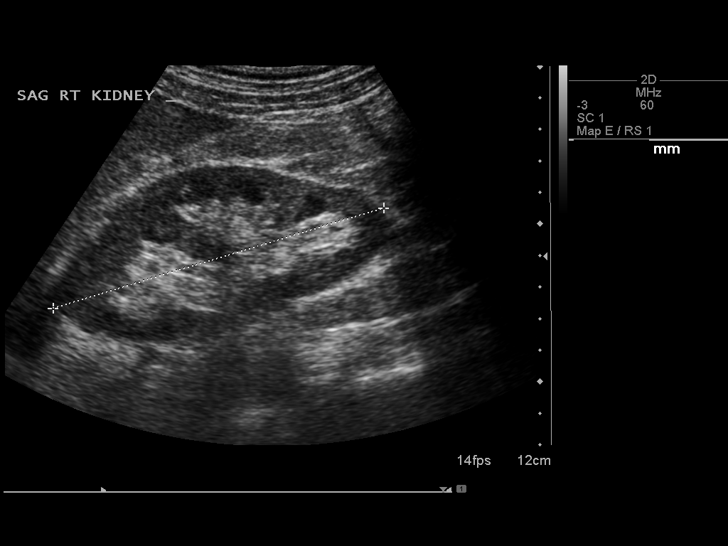
[im 6/33]
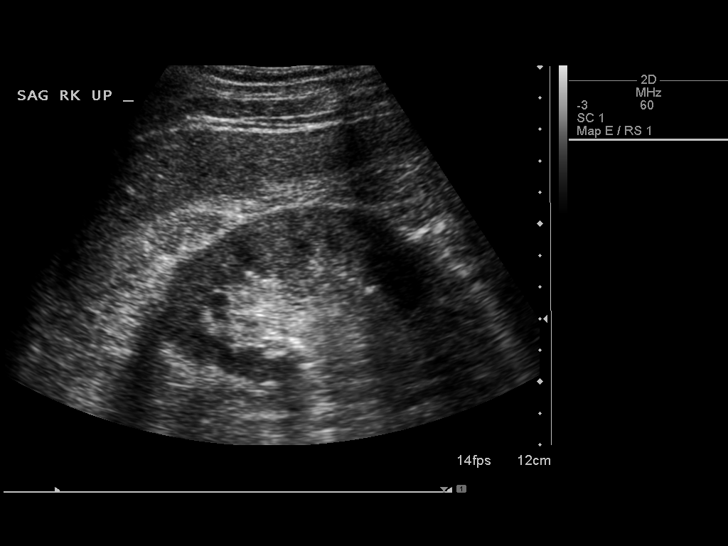
[im 9/33]
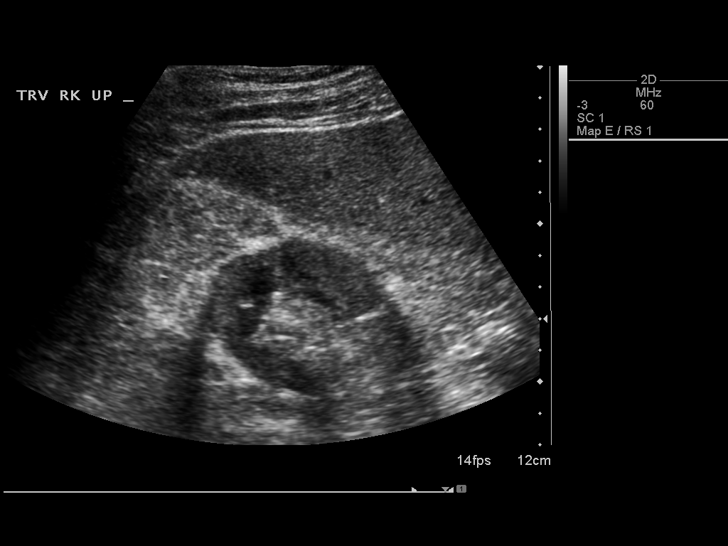
[im 11/33]
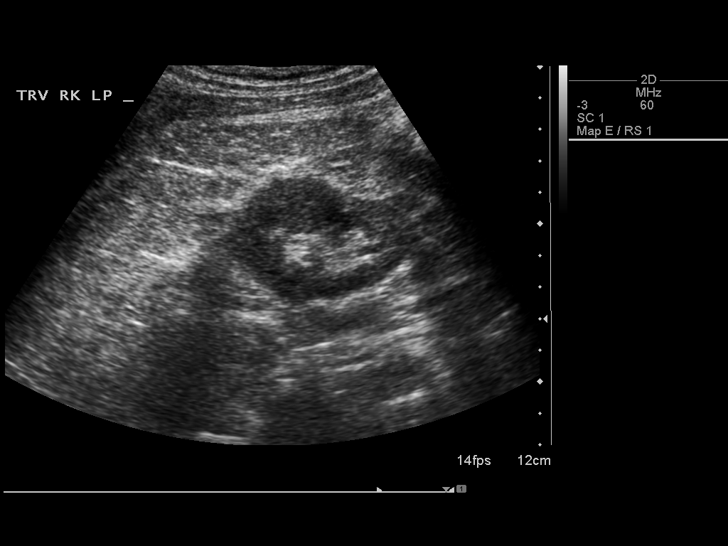
[im 13/33]
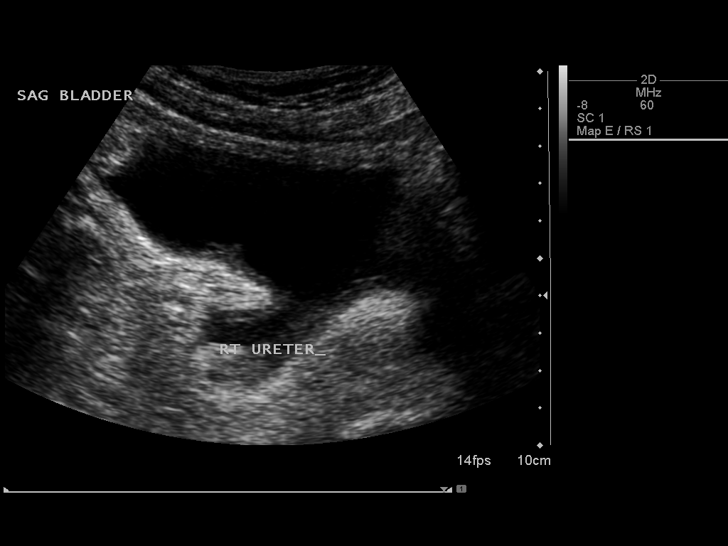
[im 15/33]
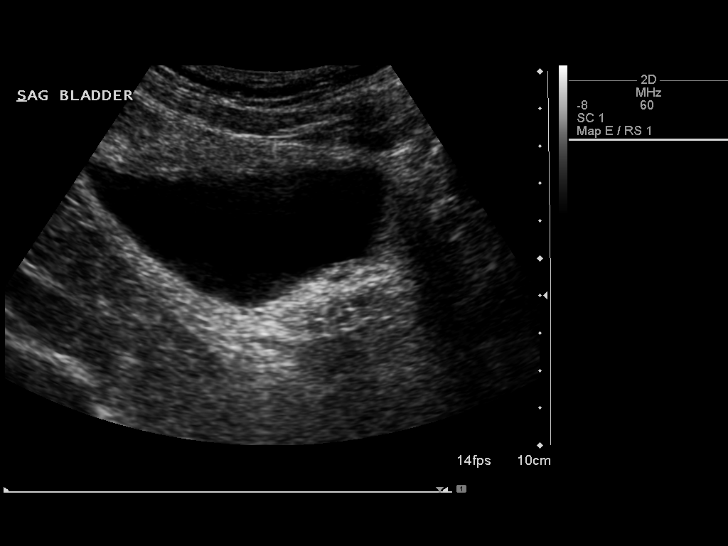
[im 18/33]
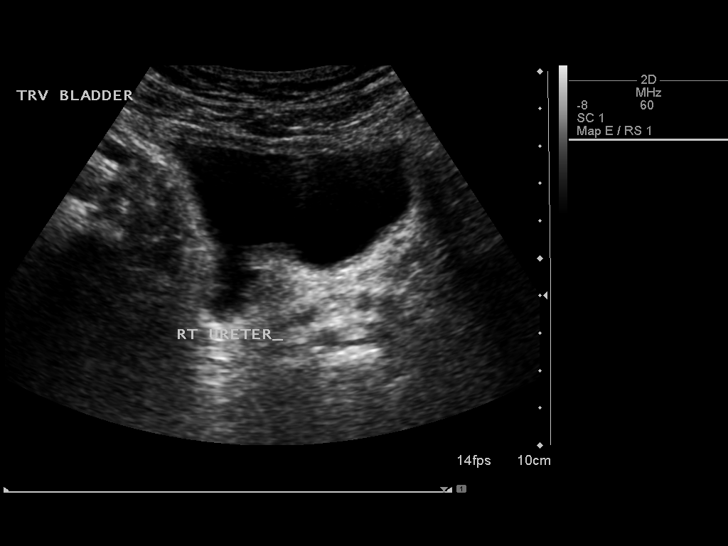
[im 21/33]
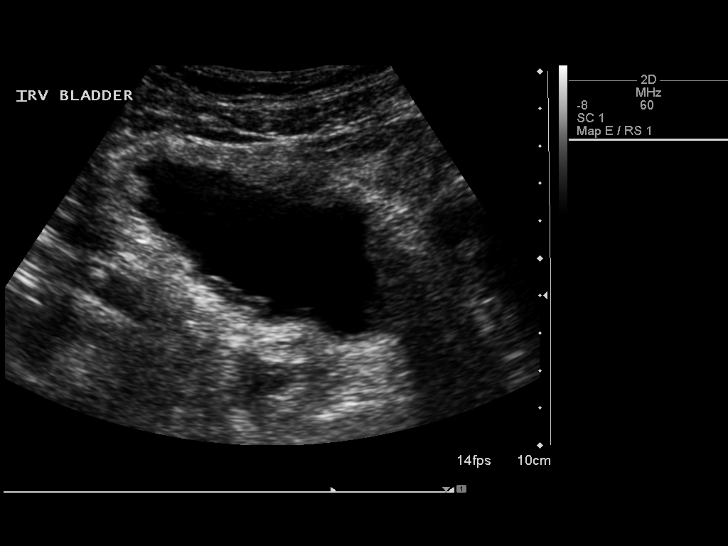
[im 22/33]
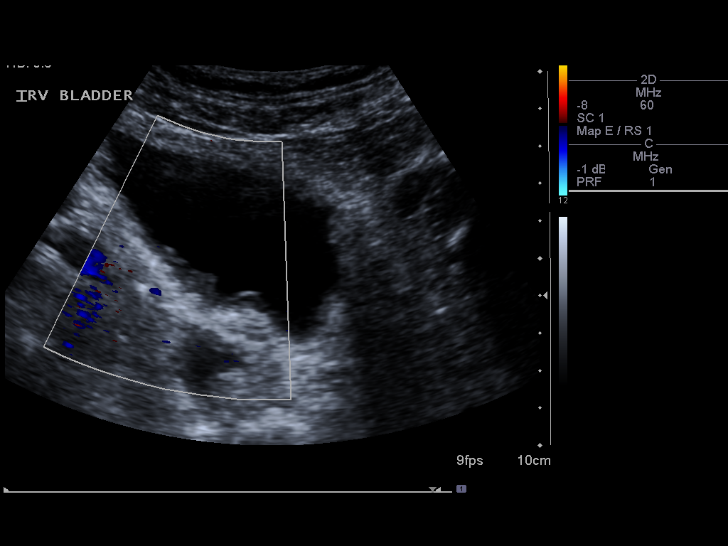
[im 25/33]
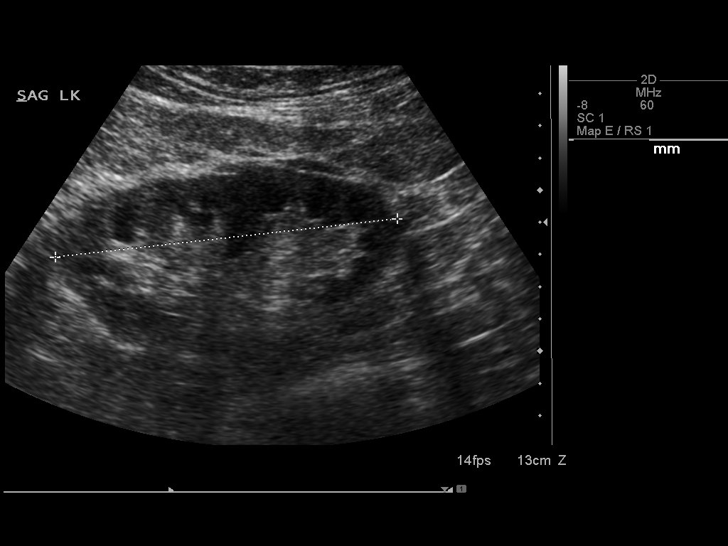
[im 27/33]
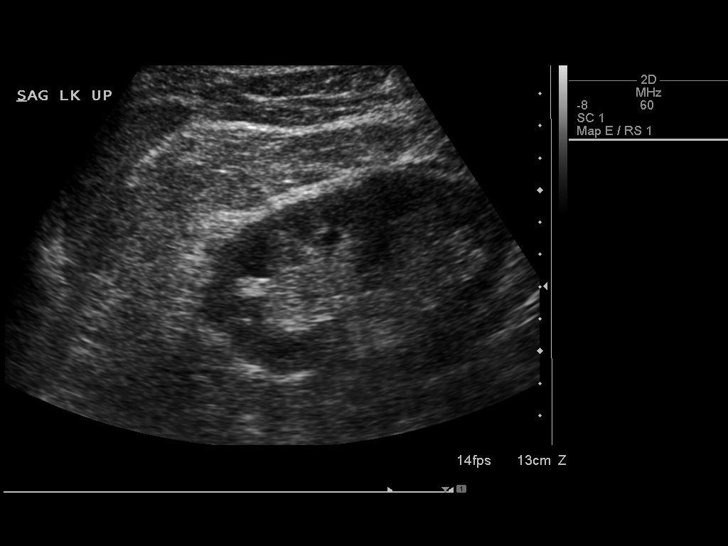
[im 30/33]
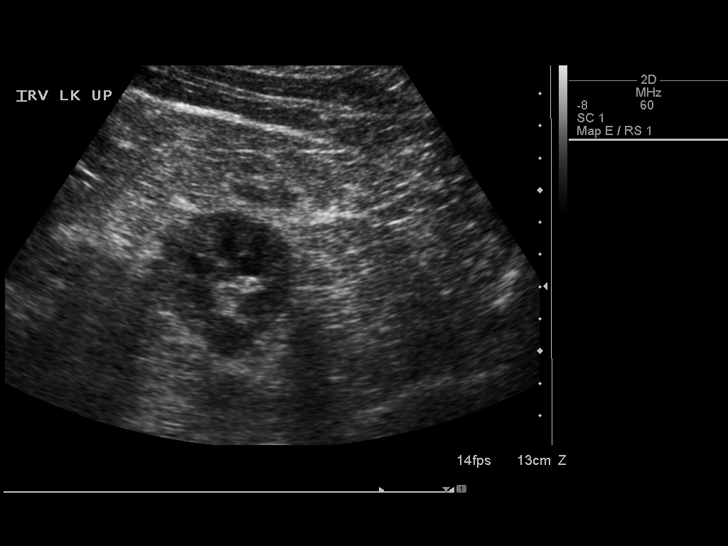
[im 33/33]
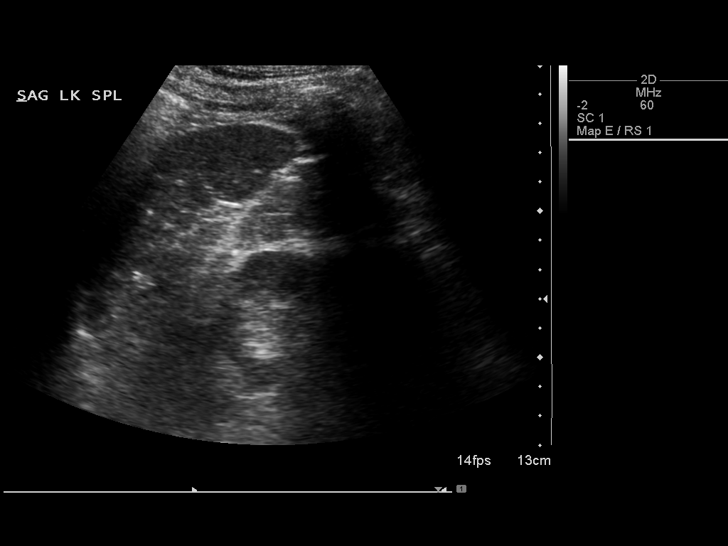

[14 of 25 positions shown; findings below may reference images not displayed]

FINDINGS: Right Kidney:

Length: 11.0 cm. Echogenicity within normal limits. No mass or
hydronephrosis visualized.

Left Kidney:

Length: 11.0 Cm. Echogenicity within normal limits. No mass or
hydronephrosis visualized.

Bladder:

Posterior bladder wall is thickened and irregular. Bladder wall
hypertrophy and/or pathology includes cystitis or bladder wall
malignancy cannot be excluded. No bladder distention. A bladder wall
diverticulum adjacent to the right ureter cannot be excluded.
IMPRESSION: 1. Posterior bladder wall is thickened and irregular bladder wall
hypertrophy and/or pathology including cystitis or bladder wall
malignancy cannot be excluded. A bladder diverticulum just to the
right ureter cannot be excluded.
2. Kidneys are unremarkable.  No hydronephrosis.

## 2015-07-25 ENCOUNTER — Other Ambulatory Visit: Payer: Self-pay | Admitting: Interventional Cardiology

## 2015-07-28 DIAGNOSIS — C670 Malignant neoplasm of trigone of bladder: Secondary | ICD-10-CM | POA: Diagnosis not present

## 2015-08-04 DIAGNOSIS — C670 Malignant neoplasm of trigone of bladder: Secondary | ICD-10-CM | POA: Diagnosis not present

## 2015-08-04 DIAGNOSIS — Z5111 Encounter for antineoplastic chemotherapy: Secondary | ICD-10-CM | POA: Diagnosis not present

## 2015-08-14 DIAGNOSIS — Z961 Presence of intraocular lens: Secondary | ICD-10-CM | POA: Diagnosis not present

## 2015-08-14 DIAGNOSIS — H40053 Ocular hypertension, bilateral: Secondary | ICD-10-CM | POA: Diagnosis not present

## 2015-08-14 DIAGNOSIS — E083213 Diabetes mellitus due to underlying condition with mild nonproliferative diabetic retinopathy with macular edema, bilateral: Secondary | ICD-10-CM | POA: Diagnosis not present

## 2015-08-25 ENCOUNTER — Encounter: Payer: Self-pay | Admitting: Vascular Surgery

## 2015-08-27 ENCOUNTER — Encounter: Payer: Self-pay | Admitting: Vascular Surgery

## 2015-08-27 ENCOUNTER — Ambulatory Visit (INDEPENDENT_AMBULATORY_CARE_PROVIDER_SITE_OTHER): Payer: Medicare Other | Admitting: Vascular Surgery

## 2015-08-27 ENCOUNTER — Ambulatory Visit (HOSPITAL_COMMUNITY)
Admission: RE | Admit: 2015-08-27 | Discharge: 2015-08-27 | Disposition: A | Discharge status: Home / Self Care | Payer: Medicare Other | Source: Ambulatory Visit | Attending: Vascular Surgery | Admitting: Vascular Surgery

## 2015-08-27 ENCOUNTER — Other Ambulatory Visit: Payer: Self-pay | Admitting: Vascular Surgery

## 2015-08-27 VITALS — BP 150/70 | HR 67 | Temp 97.5°F | Resp 16 | Ht 66.0 in | Wt 163.0 lb

## 2015-08-27 DIAGNOSIS — Z48812 Encounter for surgical aftercare following surgery on the circulatory system: Secondary | ICD-10-CM | POA: Diagnosis not present

## 2015-08-27 DIAGNOSIS — I6523 Occlusion and stenosis of bilateral carotid arteries: Secondary | ICD-10-CM

## 2015-08-27 DIAGNOSIS — I6529 Occlusion and stenosis of unspecified carotid artery: Secondary | ICD-10-CM | POA: Insufficient documentation

## 2015-08-27 NOTE — Progress Notes (Signed)
Filed Vitals:   08/27/15 1337 08/27/15 1340 08/27/15 1345 08/27/15 1346  BP: 145/73 156/75 149/75 150/70  Pulse: 67 67 66 67  Temp:  97.5 F (36.4 C)    TempSrc:  Oral    Resp:  16    Height:  5\' 6"  (1.676 m)    Weight:  163 lb (73.936 kg)    SpO2:  96%

## 2015-08-27 NOTE — Addendum Note (Signed)
Addended by: Dorthula Rue L on: 08/27/2015 03:19 PM   Modules accepted: Orders

## 2015-08-27 NOTE — Progress Notes (Signed)
[facility]ist [facility] PATIENT  Patient name: [name] MRN: [id number] DOB: [birth year] Sex: male  REASON FOR VISIT: follow up of carotid disease  HPI: [name] is a 74 y.o. male who underwent extensive left carotid endarterectomy with primary closure in April 2016. He comes in for a 6 month follow up visit. Since I saw him last he denies any history of stroke, TIAs, expressive or receptive aphasia, or amaurosis fugax.   He is on aspirin and is on a statin.  He is a smoker but is in the process of trying to quit.  Past Medical History  Diagnosis Date  . Hyperlipidemia   . Hypertension   . GERD (gastroesophageal reflux disease)   . Esophageal stricture     a. History of dilitation - Long-standing history of esophageal reflux   . 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 1999: LIMA to LAD, sequential SVG to diagonal 2 and diagonal 3, sequential SVG to PDA, acute marginal branch, and PL branch. b. Repeat CABG 2015 with SVG to D1, SVG to PDA, and SVG to OM.   Marland Kitchen Chronic combined systolic and diastolic CHF (congestive heart failure) ([place])     a. Prior EF 45-50% in 08/2014. b. Improved EF >55% in 09/2014 at [facility]  . Type II diabetes mellitus ([place])   . Anemia     a. + FOBT 08/2014.  Marland Kitchen History of hiatal hernia   . Daily headache   . Arthritis   . Chronic lower back pain   . CKD (chronic kidney disease) stage 3, GFR 30-59 ml/min   . Skin cancer   . Polymyalgia rheumatica ([place])   . SVT (supraventricular tachycardia) ([place])   . Atrial fibrillation ([place])   . Carotid artery occlusion     a. Duplex 12/2013: mild plaque bilat, 40-59% BICA. F/u due 12/2014.  Marland Kitchen Atherosclerosis of native arteries of the extremities with intermittent claudication     a. L external iliac stent 2001, bilateral SFA occlusions documented 2001.  Marland Kitchen OSA (obstructive sleep apnea)     "don't wear mask"   . Hypotonic bladder     Requiring in and out catheterization performed by the patient at  home - occasional blood tinged urine (self-cath's daily for the last 2 years) and hematuria is chronic for him, followed by urology.  . MI (myocardial infarction) ([place]) 1999-02/2014    total of 7 MI per patient  . COPD (chronic obstructive pulmonary disease) ([place])     pt states he doesn't have  . CAD (coronary artery disease), native coronary artery 09/24/2013    a. CABG x 6 1999. b. Interim PCIs: Angioplasty SVG-PDA 2006.Taxus DES to native LCx in 2007.DES to SVG-PDA 2007, restented 2009.NSTEMI with PCI to SVG- PDA complicated by no-reflow/inferior infarction in 2012. c. Redo CABG 2015 with SVG-PD, SVG-D1, SVG-OM1.  . History of gout   . Bladder cancer ([place])   . Self-catheterizes urinary bladder     Family History  Problem Relation Age of Onset  . CAD Mother   . Heart disease Mother   . Hypertension Mother   . Cirrhosis Brother   . Heart disease Father   . Hyperlipidemia Father   . Hypertension Father   . Heart attack Father     SOCIAL HISTORY: Social History  Substance Use Topics  . Smoking status: Former Smoker -- 1.00 packs/day for 50 years    Types: Cigarettes    Quit date: 02/25/2014  .  Smokeless tobacco: Never Used  . Alcohol Use: No    No Known Allergies  Current Outpatient Prescriptions  Medication Sig Dispense Refill  . acetaminophen (TYLENOL) 500 MG tablet Take 1,000 mg by mouth every 6 (six) hours as needed for moderate pain.    Marland Kitchen amLODipine (NORVASC) 10 MG tablet Take 1 tablet (10 mg total) by mouth daily.    Marland Kitchen aspirin EC 81 MG EC tablet Take 1 tablet (81 mg total) by mouth daily. 1 tablet 0  . atorvastatin (LIPITOR) 20 MG tablet Take 20 mg by mouth daily at 6 PM.     . clopidogrel (PLAVIX) 75 MG tablet Take 1 tablet (75 mg total) by mouth daily. 90 tablet 3  . Cranberry 500 MG CAPS Take 1 capsule by mouth at bedtime.    . ferrous sulfate 325 (65 FE) MG tablet Take 325 mg by mouth every evening.     Marland Kitchen glipiZIDE (GLUCOTROL) 10 MG tablet Take 1 tablet (10 mg  total) by mouth 2 (two) times daily before a meal. 60 tablet 0  . hydrALAZINE (APRESOLINE) 50 MG tablet Take 50 mg by mouth 3 (three) times daily as needed (Will only take when blood pressure is running high. Will at least take once daily).     . Insulin Glargine (LANTUS) 100 UNIT/ML Solostar Pen Inject 10 Units into the skin every morning. (Patient taking differently: Inject 10 Units into the skin daily with breakfast. ) 15 mL 1  . isosorbide mononitrate (IMDUR) 120 MG 24 hr tablet TAKE 1 TABLET BY MOUTH EVERY DAY 30 tablet 6  . metoprolol (LOPRESSOR) 50 MG tablet Take 1 tablet (50 mg total) by mouth 2 (two) times daily. 60 tablet 11  . Multiple Vitamin (MULTIVITAMIN WITH MINERALS) TABS tablet Take 1 tablet by mouth daily.    . nitroGLYCERIN (NITROSTAT) 0.4 MG SL tablet Place 0.4 mg under the tongue every 5 (five) minutes as needed for chest pain.    . nitroGLYCERIN (NITROSTAT) 0.4 MG SL tablet PLACE 1 TABLET UNDER THE TONGUE EVERY 5 MINUTES AS NEEDED FOR CHEST PAIN 25 tablet 1  . pantoprazole (PROTONIX) 40 MG tablet Take 1 tablet (40 mg total) by mouth daily. 90 tablet 3  . torsemide (DEMADEX) 20 MG tablet Take 2 tablets by mouth once a day    . HYDROcodone-acetaminophen (NORCO) 10-325 MG per tablet Take 1-2 tablets by mouth every 4 (four) hours as needed for moderate pain. Maximum dose per 24 hours -8 pills. (Patient not taking: Reported on 08/27/2015) 30 tablet 0  . phenazopyridine (PYRIDIUM) 200 MG tablet Take 1 tablet (200 mg total) by mouth 3 (three) times daily as needed for pain. (Patient not taking: Reported on 08/27/2015) 30 tablet 0   No current facility-administered medications for this visit.    REVIEW OF SYSTEMS:  [X]  denotes positive finding, [ ]  denotes negative finding Cardiac  Comments:  Chest pain or chest pressure:    Shortness of breath upon exertion:    Short of breath when lying flat:    Irregular heart rhythm: X       Vascular    Pain in calf, thigh, or hip brought on  by ambulation: X Bilateral lower extremity claudication  Pain in feet at night that wakes you up from your sleep:     Blood clot in your veins:    Leg swelling:         Pulmonary    Oxygen at home:    Productive cough:  Wheezing:         Neurologic    Sudden weakness in arms or legs:     Sudden numbness in arms or legs:     Sudden onset of difficulty speaking or slurred speech:    Temporary loss of vision in one eye:     Problems with dizziness:         Gastrointestinal    Blood in stool:     Vomited blood:         Genitourinary    Burning when urinating:     Blood in urine:        Psychiatric    Major depression:         Hematologic    Bleeding problems:    Problems with blood clotting too easily:        Skin    Rashes or ulcers:        Constitutional    Fever or chills:      PHYSICAL EXAM: Filed Vitals:   08/27/15 1337 08/27/15 1340 08/27/15 1345 08/27/15 1346  BP: 145/73 156/75 149/75 150/70  Pulse: 67 67 66 67  Temp:  97.5 F (36.4 C)    TempSrc:  Oral    Resp:  16    Height:  5\' 6"  (1.676 m)    Weight:  163 lb (73.936 kg)    SpO2:  96%      GENERAL: The patient is a well-nourished male, in no acute distress. The vital signs are documented above. CARDIAC: There is a regular rate and rhythm.  VASCULAR: I do not detect carotid bruits. I cannot palpate pedal pulses. PULMONARY: There is good air exchange bilaterally without wheezing or rales. ABDOMEN: Soft and non-tender with normal pitched bowel sounds.  MUSCULOSKELETAL: There are no major deformities or cyanosis. NEUROLOGIC: No focal weakness or paresthesias are detected. SKIN: There are no ulcers or rashes noted. PSYCHIATRIC: The patient has a normal affect.  DATA:  I have independently interpreted his carotid duplex scan today which shows no evidence of recurrent carotid stenosis in his left carotid endarterectomy site. On the right side he has no significant stenosis. Both vertebral arteries  are patent with antegrade flow.  MEDICAL ISSUES:  STATUS POST LEFT CAROTID ENDARTERECTOMY: The patient is now 6 months status post left carotid endarterectomy. He has no evidence of recurrent stenosis. He remains asymptomatic. We have again discussed the importance of tobacco cessation. He is on aspirin and is on a statin. I recommended a follow up carotid duplex scan in 6 months and I'll see him back at that time. If this study looks good then and we will simply do a yearly follow up study.  PERIPHERAL VASCULAR DISEASE: His peripheral vascular disease is followed by Dr.Arida.     Deitra Mayo [facility] [place]: [phone]

## 2015-09-01 DIAGNOSIS — E1165 Type 2 diabetes mellitus with hyperglycemia: Secondary | ICD-10-CM | POA: Diagnosis not present

## 2015-09-01 DIAGNOSIS — E7800 Pure hypercholesterolemia, unspecified: Secondary | ICD-10-CM | POA: Diagnosis not present

## 2015-09-01 DIAGNOSIS — I739 Peripheral vascular disease, unspecified: Secondary | ICD-10-CM | POA: Diagnosis not present

## 2015-09-01 DIAGNOSIS — E781 Pure hyperglyceridemia: Secondary | ICD-10-CM | POA: Diagnosis not present

## 2015-09-01 DIAGNOSIS — R300 Dysuria: Secondary | ICD-10-CM | POA: Diagnosis not present

## 2015-09-01 DIAGNOSIS — I10 Essential (primary) hypertension: Secondary | ICD-10-CM | POA: Diagnosis not present

## 2015-09-01 DIAGNOSIS — N183 Chronic kidney disease, stage 3 (moderate): Secondary | ICD-10-CM | POA: Diagnosis not present

## 2015-09-02 DIAGNOSIS — N2581 Secondary hyperparathyroidism of renal origin: Secondary | ICD-10-CM | POA: Diagnosis not present

## 2015-09-02 DIAGNOSIS — N189 Chronic kidney disease, unspecified: Secondary | ICD-10-CM | POA: Diagnosis not present

## 2015-09-02 DIAGNOSIS — N183 Chronic kidney disease, stage 3 (moderate): Secondary | ICD-10-CM | POA: Diagnosis not present

## 2015-09-08 ENCOUNTER — Other Ambulatory Visit: Payer: Self-pay | Admitting: Interventional Cardiology

## 2015-09-08 DIAGNOSIS — N183 Chronic kidney disease, stage 3 (moderate): Secondary | ICD-10-CM | POA: Diagnosis not present

## 2015-09-08 DIAGNOSIS — N2581 Secondary hyperparathyroidism of renal origin: Secondary | ICD-10-CM | POA: Diagnosis not present

## 2015-09-08 DIAGNOSIS — I10 Essential (primary) hypertension: Secondary | ICD-10-CM | POA: Diagnosis not present

## 2015-09-08 DIAGNOSIS — D631 Anemia in chronic kidney disease: Secondary | ICD-10-CM | POA: Diagnosis not present

## 2015-10-09 DIAGNOSIS — H4323 Crystalline deposits in vitreous body, bilateral: Secondary | ICD-10-CM | POA: Diagnosis not present

## 2015-10-09 DIAGNOSIS — H40053 Ocular hypertension, bilateral: Secondary | ICD-10-CM | POA: Diagnosis not present

## 2015-10-09 DIAGNOSIS — Z961 Presence of intraocular lens: Secondary | ICD-10-CM | POA: Diagnosis not present

## 2015-10-09 DIAGNOSIS — E113313 Type 2 diabetes mellitus with moderate nonproliferative diabetic retinopathy with macular edema, bilateral: Secondary | ICD-10-CM | POA: Diagnosis not present

## 2015-10-09 DIAGNOSIS — H18332 Rupture in Descemet's membrane, left eye: Secondary | ICD-10-CM | POA: Diagnosis not present

## 2015-10-09 DIAGNOSIS — H35013 Changes in retinal vascular appearance, bilateral: Secondary | ICD-10-CM | POA: Diagnosis not present

## 2015-10-10 DIAGNOSIS — Z961 Presence of intraocular lens: Secondary | ICD-10-CM | POA: Diagnosis not present

## 2015-10-10 DIAGNOSIS — H35073 Retinal telangiectasis, bilateral: Secondary | ICD-10-CM | POA: Diagnosis not present

## 2015-10-28 ENCOUNTER — Telehealth: Payer: Self-pay | Admitting: *Deleted

## 2015-10-28 ENCOUNTER — Telehealth: Payer: Self-pay | Admitting: Interventional Cardiology

## 2015-10-28 DIAGNOSIS — N183 Chronic kidney disease, stage 3 unspecified: Secondary | ICD-10-CM

## 2015-10-28 NOTE — Telephone Encounter (Signed)
Request fwd to Dr.Smith for apporval

## 2015-10-28 NOTE — Telephone Encounter (Signed)
Follow up        *STAT* If patient is at the pharmacy, call can be transferred to refill team.   1. Which medications need to be refilled? (please list name of each medication and dose if known)  Torsemide 20mg   2. Which pharmacy/location (including street and city if local pharmacy) is medication to be sent to?  [facility] in [place] 3. Do they need a 30 day or 90 day supply? 30 day

## 2015-10-28 NOTE — Telephone Encounter (Signed)
New Message  Pt called States that he was placed on Torsemide 20 mg by Tawni Levy. This replaced a medication Dr. Tamala Julian had her on which was lasix water pill. He says that he is out and he cannot get a refill. He says that he knows that Dr. Tamala Julian has no record of this medication because he was placed on it at [facility]. But he would like Dr. Tamala Julian to refill it. Pt request a call back from the nurse.

## 2015-10-28 NOTE — Telephone Encounter (Signed)
Patient left a voicemail on the refill line stating that he was put on torsemide 20mg , take two daily by a physician at [facility] in December 2015. He would like for Dr Tamala Julian to refill. Ok to reorder under Dr Tamala Julian?

## 2015-10-29 MED ORDER — TORSEMIDE 20 MG PO TABS: ORAL_TABLET | ORAL | Status: DC

## 2015-10-29 NOTE — Telephone Encounter (Signed)
Pt's Rx sent to pt's pharmacy as requested. Confirmation received.  °

## 2015-10-29 NOTE — Telephone Encounter (Signed)
Approve

## 2015-10-29 NOTE — Telephone Encounter (Signed)
rqst previously fwd to [place]

## 2015-10-29 NOTE — Telephone Encounter (Signed)
Already addressed

## 2015-11-04 ENCOUNTER — Telehealth: Payer: Self-pay | Admitting: Interventional Cardiology

## 2015-11-04 DIAGNOSIS — Z0000 Encounter for general adult medical examination without abnormal findings: Secondary | ICD-10-CM | POA: Diagnosis not present

## 2015-11-04 DIAGNOSIS — N323 Diverticulum of bladder: Secondary | ICD-10-CM | POA: Diagnosis not present

## 2015-11-04 DIAGNOSIS — Z87440 Personal history of urinary (tract) infections: Secondary | ICD-10-CM | POA: Diagnosis not present

## 2015-11-04 DIAGNOSIS — C672 Malignant neoplasm of lateral wall of bladder: Secondary | ICD-10-CM | POA: Diagnosis not present

## 2015-11-04 NOTE — Telephone Encounter (Signed)
New message      Request for surgical clearance:  1. What type of surgery is being performed? TUR bladder turmor  When is this surgery scheduled? Pending clearance Are there any medications that need to be held prior to surgery and how long? Hold plavix 7 days prior Name of physician performing surgery?  Dr Karsten Ro 2. What is your office phone and fax number?  Fax [phone]

## 2015-11-04 NOTE — Telephone Encounter (Signed)
Clearance rqst fwd to Dr.Smith to advise

## 2015-11-07 NOTE — Telephone Encounter (Signed)
Okay to hold plavix 5-7 days before surgery and continue aspirin if possible.  Cleared for upcoming surgery and will ne moderate to high risk.

## 2015-11-10 ENCOUNTER — Other Ambulatory Visit: Payer: Self-pay | Admitting: Urology

## 2015-11-10 NOTE — Telephone Encounter (Signed)
Clearance placed in Mr nurse fax box to be faxed to [facility] attn: Jeannene Patella fax [phone]

## 2015-11-11 DIAGNOSIS — H35073 Retinal telangiectasis, bilateral: Secondary | ICD-10-CM | POA: Diagnosis not present

## 2015-11-11 DIAGNOSIS — R300 Dysuria: Secondary | ICD-10-CM | POA: Diagnosis not present

## 2015-11-11 DIAGNOSIS — N390 Urinary tract infection, site not specified: Secondary | ICD-10-CM | POA: Diagnosis not present

## 2015-11-19 ENCOUNTER — Encounter (HOSPITAL_COMMUNITY): Payer: Self-pay

## 2015-11-19 ENCOUNTER — Encounter (HOSPITAL_COMMUNITY)
Admission: RE | Admit: 2015-11-19 | Discharge: 2015-11-19 | Disposition: A | Discharge status: Home / Self Care | Payer: Medicare Other | Source: Ambulatory Visit | Attending: Urology | Admitting: Urology

## 2015-11-19 DIAGNOSIS — Z7982 Long term (current) use of aspirin: Secondary | ICD-10-CM | POA: Insufficient documentation

## 2015-11-19 DIAGNOSIS — C679 Malignant neoplasm of bladder, unspecified: Secondary | ICD-10-CM | POA: Insufficient documentation

## 2015-11-19 DIAGNOSIS — Z951 Presence of aortocoronary bypass graft: Secondary | ICD-10-CM | POA: Insufficient documentation

## 2015-11-19 DIAGNOSIS — E7800 Pure hypercholesterolemia, unspecified: Secondary | ICD-10-CM | POA: Diagnosis not present

## 2015-11-19 DIAGNOSIS — I252 Old myocardial infarction: Secondary | ICD-10-CM | POA: Diagnosis not present

## 2015-11-19 DIAGNOSIS — Z87891 Personal history of nicotine dependence: Secondary | ICD-10-CM | POA: Insufficient documentation

## 2015-11-19 DIAGNOSIS — Z7984 Long term (current) use of oral hypoglycemic drugs: Secondary | ICD-10-CM | POA: Diagnosis not present

## 2015-11-19 DIAGNOSIS — Z79899 Other long term (current) drug therapy: Secondary | ICD-10-CM | POA: Insufficient documentation

## 2015-11-19 DIAGNOSIS — I10 Essential (primary) hypertension: Secondary | ICD-10-CM | POA: Insufficient documentation

## 2015-11-19 DIAGNOSIS — Z01812 Encounter for preprocedural laboratory examination: Secondary | ICD-10-CM | POA: Diagnosis not present

## 2015-11-19 DIAGNOSIS — E119 Type 2 diabetes mellitus without complications: Secondary | ICD-10-CM | POA: Insufficient documentation

## 2015-11-19 HISTORY — DX: Personal history of other diseases of the respiratory system: Z87.09

## 2015-11-19 HISTORY — DX: Reserved for inherently not codable concepts without codable children: IMO0001

## 2015-11-19 HISTORY — DX: Cardiac arrhythmia, unspecified: I49.9

## 2015-11-19 HISTORY — DX: Unsteadiness on feet: R26.81

## 2015-11-19 LAB — BASIC METABOLIC PANEL
ANION GAP: 9 (ref 5–15)
BUN: 36 mg/dL — ABNORMAL HIGH (ref 6–20)
CALCIUM: 9.2 mg/dL (ref 8.9–10.3)
CHLORIDE: 98 mmol/L — AB (ref 101–111)
CO2: 27 mmol/L (ref 22–32)
CREATININE: 1.86 mg/dL — AB (ref 0.61–1.24)
GFR calc non Af Amer: 34 mL/min — ABNORMAL LOW (ref >60)
GFR, EST AFRICAN AMERICAN: 39 mL/min — AB (ref >60)
Glucose, Bld: 447 mg/dL — ABNORMAL HIGH (ref 65–99)
Potassium: 5.3 mmol/L — ABNORMAL HIGH (ref 3.5–5.1)
SODIUM: 134 mmol/L — AB (ref 135–145)

## 2015-11-19 LAB — CBC
HCT: 40.4 % (ref 39.0–52.0)
HEMOGLOBIN: 13.7 g/dL (ref 13.0–17.0)
MCH: 28.8 pg (ref 26.0–34.0)
MCHC: 33.9 g/dL (ref 30.0–36.0)
MCV: 84.9 fL (ref 78.0–100.0)
PLATELETS: 230 10*3/uL (ref 150–400)
RBC: 4.76 MIL/uL (ref 4.22–5.81)
RDW: 13.1 % (ref 11.5–15.5)
WBC: 8.9 10*3/uL (ref 4.0–10.5)

## 2015-11-19 LAB — PROTIME-INR
INR: 0.99 (ref 0.00–1.49)
PROTHROMBIN TIME: 13.3 s (ref 11.6–15.2)

## 2015-11-19 NOTE — Progress Notes (Signed)
Discussed pts H&P with Dr Althia Forts. No orders given. Anesthesia to see pt day of surgery. LOV note per vascular MD / epic 08/27/2015 LOV note per cardiology/epic 04/15/2015 Clearance note per chart per Dr Mallie Mussel Smith/cardiology 11/07/2015 CXR/epic 01/23/2015 EKG/epic 04/24/2015 ECHO/epic 08/26/2014

## 2015-11-19 NOTE — Progress Notes (Signed)
Pt is aware to also take metoprolol morning of surgery

## 2015-11-19 NOTE — Progress Notes (Signed)
BMP results in epic per PAT visit 11/19/2015 routed to Dr Karsten Ro. Also spoke with Pam scheduler for Dr Karsten Ro in regards to lab work.

## 2015-11-19 NOTE — Patient Instructions (Signed)
[name]  11/19/2015   Your procedure is scheduled on: Monday November 24, 2015  Report to [facility] Main  Entrance take [place]  elevators to 3rd floor to  [place] at 5:30 AM.  Call this number if you have problems the morning of surgery [phone]   Remember: ONLY 1 PERSON MAY GO WITH YOU TO SHORT STAY TO GET  READY MORNING OF [place].  Do not eat food or drink liquids :After Midnight.     Take these medicines the morning of surgery with A SIP OF WATER: Amlodipine if needed; Hydralazine if needed; FOLLOW CARDIOLOGY INSTRUCTION IN REGARDS TO BP PARAMETERS); ISOSORBIDE; PANTOPRAZOLE (Protonix);ASPIRIN              HOLD PLAVIX 5 DAYS PRIOR TO SURGERY AND CONTINUE ASPIRIN PER DR HENRY SMITH/CARDIOLOGY INSTRUCTION  DO NOT TAKE ANY DIABETIC MEDICATIONS DAY OF YOUR SURGERY                               You may not have any metal on your body including hair pins and              piercings  Do not wear jewelry, lotions, powders or colognes, deodorant                           Men may shave face and neck.   Do not bring valuables to the hospital. [place].  Contacts, dentures or bridgework may not be worn into surgery.     Patients discharged the day of surgery will not be allowed to drive home.  Name and phone number of your driver:[name] (wife)  _____________________________________________________________________             [facility] - Preparing for Surgery Before surgery, you can play an important role.  Because skin is not sterile, your skin needs to be as free of germs as possible.  You can reduce the number of germs on your skin by washing with CHG (chlorahexidine gluconate) soap before surgery.  CHG is an antiseptic cleaner which kills germs and bonds with the skin to continue killing germs even after washing. Please DO NOT use if you have an allergy to CHG or antibacterial  soaps.  If your skin becomes reddened/irritated stop using the CHG and inform your nurse when you arrive at Short Stay. Do not shave (including legs and underarms) for at least 48 hours prior to the first CHG shower.  You may shave your face/neck. Please follow these instructions carefully:  1.  Shower with CHG Soap the night before surgery and the  morning of Surgery.  2.  If you choose to wash your hair, wash your hair first as usual with your  normal  shampoo.  3.  After you shampoo, rinse your hair and body thoroughly to remove the  shampoo.                           4.  Use CHG as you would any other liquid soap.  You can apply chg directly  to the skin and wash  Gently with a scrungie or clean washcloth.  5.  Apply the CHG Soap to your body ONLY FROM THE NECK DOWN.   Do not use on face/ open                           Wound or open sores. Avoid contact with eyes, ears mouth and genitals (private parts).                       Wash face,  Genitals (private parts) with your normal soap.             6.  Wash thoroughly, paying special attention to the area where your surgery  will be performed.  7.  Thoroughly rinse your body with warm water from the neck down.  8.  DO NOT shower/wash with your normal soap after using and rinsing off  the CHG Soap.                9.  Pat yourself dry with a clean towel.            10.  Wear clean pajamas.            11.  Place clean sheets on your bed the night of your first shower and do not  sleep with pets. Day of Surgery : Do not apply any lotions/deodorants the morning of surgery.  Please wear clean clothes to the hospital/surgery center.  FAILURE TO FOLLOW THESE INSTRUCTIONS MAY RESULT IN THE CANCELLATION OF YOUR SURGERY PATIENT SIGNATURE_________________________________  NURSE SIGNATURE__________________________________  ________________________________________________________________________

## 2015-11-20 ENCOUNTER — Other Ambulatory Visit: Payer: Self-pay | Admitting: Physician Assistant

## 2015-11-20 LAB — HEMOGLOBIN A1C
HEMOGLOBIN A1C: 8.5 % — AB (ref 4.8–5.6)
MEAN PLASMA GLUCOSE: 197 mg/dL

## 2015-11-20 NOTE — Progress Notes (Signed)
A1C results in epic per PAT visit 11/19/2015 sent to Dr Karsten Ro

## 2015-11-21 ENCOUNTER — Other Ambulatory Visit: Payer: Self-pay | Admitting: Interventional Cardiology

## 2015-11-21 MED ORDER — HYDRALAZINE HCL 50 MG PO TABS: ORAL_TABLET | ORAL | Status: DC

## 2015-11-21 NOTE — Telephone Encounter (Signed)
Pt's Rx was sent to pt's pharmacy as requested. Confirmation received.  °

## 2015-11-24 ENCOUNTER — Encounter (HOSPITAL_COMMUNITY)
Admission: RE | Disposition: A | Discharge status: Home / Self Care | Payer: Self-pay | Source: Ambulatory Visit | Attending: Urology

## 2015-11-24 ENCOUNTER — Ambulatory Visit (HOSPITAL_COMMUNITY)
Admission: RE | Admit: 2015-11-24 | Discharge: 2015-11-24 | Disposition: A | Discharge status: Home / Self Care | Payer: Medicare Other | Source: Ambulatory Visit | Attending: Urology | Admitting: Urology

## 2015-11-24 ENCOUNTER — Encounter (HOSPITAL_COMMUNITY): Payer: Self-pay | Admitting: *Deleted

## 2015-11-24 ENCOUNTER — Ambulatory Visit (HOSPITAL_COMMUNITY): Payer: Medicare Other | Admitting: Anesthesiology

## 2015-11-24 DIAGNOSIS — I2510 Atherosclerotic heart disease of native coronary artery without angina pectoris: Secondary | ICD-10-CM | POA: Insufficient documentation

## 2015-11-24 DIAGNOSIS — G709 Myoneural disorder, unspecified: Secondary | ICD-10-CM | POA: Insufficient documentation

## 2015-11-24 DIAGNOSIS — B9689 Other specified bacterial agents as the cause of diseases classified elsewhere: Secondary | ICD-10-CM | POA: Insufficient documentation

## 2015-11-24 DIAGNOSIS — Z08 Encounter for follow-up examination after completed treatment for malignant neoplasm: Secondary | ICD-10-CM | POA: Diagnosis present

## 2015-11-24 DIAGNOSIS — G4730 Sleep apnea, unspecified: Secondary | ICD-10-CM | POA: Diagnosis not present

## 2015-11-24 DIAGNOSIS — Z794 Long term (current) use of insulin: Secondary | ICD-10-CM | POA: Insufficient documentation

## 2015-11-24 DIAGNOSIS — I509 Heart failure, unspecified: Secondary | ICD-10-CM | POA: Insufficient documentation

## 2015-11-24 DIAGNOSIS — N402 Nodular prostate without lower urinary tract symptoms: Secondary | ICD-10-CM | POA: Diagnosis not present

## 2015-11-24 DIAGNOSIS — Z7982 Long term (current) use of aspirin: Secondary | ICD-10-CM | POA: Insufficient documentation

## 2015-11-24 DIAGNOSIS — K219 Gastro-esophageal reflux disease without esophagitis: Secondary | ICD-10-CM | POA: Diagnosis not present

## 2015-11-24 DIAGNOSIS — J449 Chronic obstructive pulmonary disease, unspecified: Secondary | ICD-10-CM | POA: Diagnosis not present

## 2015-11-24 DIAGNOSIS — C679 Malignant neoplasm of bladder, unspecified: Secondary | ICD-10-CM | POA: Diagnosis not present

## 2015-11-24 DIAGNOSIS — Z951 Presence of aortocoronary bypass graft: Secondary | ICD-10-CM | POA: Insufficient documentation

## 2015-11-24 DIAGNOSIS — I252 Old myocardial infarction: Secondary | ICD-10-CM | POA: Insufficient documentation

## 2015-11-24 DIAGNOSIS — K449 Diaphragmatic hernia without obstruction or gangrene: Secondary | ICD-10-CM | POA: Insufficient documentation

## 2015-11-24 DIAGNOSIS — N390 Urinary tract infection, site not specified: Secondary | ICD-10-CM | POA: Diagnosis not present

## 2015-11-24 DIAGNOSIS — I110 Hypertensive heart disease with heart failure: Secondary | ICD-10-CM | POA: Insufficient documentation

## 2015-11-24 DIAGNOSIS — N183 Chronic kidney disease, stage 3 (moderate): Secondary | ICD-10-CM | POA: Diagnosis not present

## 2015-11-24 DIAGNOSIS — Z87891 Personal history of nicotine dependence: Secondary | ICD-10-CM | POA: Insufficient documentation

## 2015-11-24 DIAGNOSIS — C678 Malignant neoplasm of overlapping sites of bladder: Secondary | ICD-10-CM | POA: Insufficient documentation

## 2015-11-24 DIAGNOSIS — E7800 Pure hypercholesterolemia, unspecified: Secondary | ICD-10-CM | POA: Diagnosis not present

## 2015-11-24 DIAGNOSIS — M1990 Unspecified osteoarthritis, unspecified site: Secondary | ICD-10-CM | POA: Diagnosis not present

## 2015-11-24 DIAGNOSIS — Z79899 Other long term (current) drug therapy: Secondary | ICD-10-CM | POA: Diagnosis not present

## 2015-11-24 DIAGNOSIS — Z7902 Long term (current) use of antithrombotics/antiplatelets: Secondary | ICD-10-CM | POA: Diagnosis not present

## 2015-11-24 DIAGNOSIS — Z8551 Personal history of malignant neoplasm of bladder: Secondary | ICD-10-CM | POA: Insufficient documentation

## 2015-11-24 DIAGNOSIS — E1151 Type 2 diabetes mellitus with diabetic peripheral angiopathy without gangrene: Secondary | ICD-10-CM | POA: Insufficient documentation

## 2015-11-24 DIAGNOSIS — I129 Hypertensive chronic kidney disease with stage 1 through stage 4 chronic kidney disease, or unspecified chronic kidney disease: Secondary | ICD-10-CM | POA: Diagnosis not present

## 2015-11-24 HISTORY — PX: TRANSURETHRAL RESECTION OF BLADDER TUMOR WITH GYRUS (TURBT-GYRUS): SHX6458

## 2015-11-24 LAB — GLUCOSE, CAPILLARY
Glucose-Capillary: 208 mg/dL — ABNORMAL HIGH (ref 65–99)
Glucose-Capillary: 221 mg/dL — ABNORMAL HIGH (ref 65–99)
Glucose-Capillary: 224 mg/dL — ABNORMAL HIGH (ref 65–99)

## 2015-11-24 SURGERY — TRANSURETHRAL RESECTION OF BLADDER TUMOR WITH GYRUS (TURBT-GYRUS)
Anesthesia: General

## 2015-11-24 MED ORDER — FENTANYL CITRATE (PF) 100 MCG/2ML IJ SOLN: 25.0000 ug | INTRAMUSCULAR | Status: DC | PRN

## 2015-11-24 MED ORDER — CIPROFLOXACIN IN D5W 400 MG/200ML IV SOLN
INTRAVENOUS | Status: DC | PRN
  Administered 2015-11-24: 200 mg via INTRAVENOUS

## 2015-11-24 MED ORDER — LIDOCAINE HCL (CARDIAC) 20 MG/ML IV SOLN
INTRAVENOUS | Status: AC
  Filled 2015-11-24: qty 5

## 2015-11-24 MED ORDER — FENTANYL CITRATE (PF) 100 MCG/2ML IJ SOLN
INTRAMUSCULAR | Status: DC | PRN
  Administered 2015-11-24: 25 ug via INTRAVENOUS
  Administered 2015-11-24 (×3): 50 ug via INTRAVENOUS
  Administered 2015-11-24 (×3): 25 ug via INTRAVENOUS

## 2015-11-24 MED ORDER — PHENYLEPHRINE 40 MCG/ML (10ML) SYRINGE FOR IV PUSH (FOR BLOOD PRESSURE SUPPORT)
PREFILLED_SYRINGE | INTRAVENOUS | Status: AC
  Filled 2015-11-24: qty 10

## 2015-11-24 MED ORDER — CIPROFLOXACIN IN D5W 200 MG/100ML IV SOLN
INTRAVENOUS | Status: AC
  Filled 2015-11-24: qty 100

## 2015-11-24 MED ORDER — PHENYLEPHRINE HCL 10 MG/ML IJ SOLN
INTRAMUSCULAR | Status: DC | PRN
  Administered 2015-11-24 (×4): 80 ug via INTRAVENOUS

## 2015-11-24 MED ORDER — LACTATED RINGERS IV SOLN: INTRAVENOUS | Status: DC

## 2015-11-24 MED ORDER — ONDANSETRON HCL 4 MG/2ML IJ SOLN
INTRAMUSCULAR | Status: AC
  Filled 2015-11-24: qty 2

## 2015-11-24 MED ORDER — FENTANYL CITRATE (PF) 250 MCG/5ML IJ SOLN
INTRAMUSCULAR | Status: AC
  Filled 2015-11-24: qty 5

## 2015-11-24 MED ORDER — PROMETHAZINE HCL 25 MG/ML IJ SOLN: 6.2500 mg | INTRAMUSCULAR | Status: DC | PRN

## 2015-11-24 MED ORDER — MIDAZOLAM HCL 2 MG/2ML IJ SOLN
INTRAMUSCULAR | Status: AC
  Filled 2015-11-24: qty 2

## 2015-11-24 MED ORDER — HYDROCODONE-ACETAMINOPHEN 10-325 MG PO TABS: 1.0000 | ORAL_TABLET | ORAL | Status: DC | PRN

## 2015-11-24 MED ORDER — ONDANSETRON HCL 4 MG/2ML IJ SOLN
INTRAMUSCULAR | Status: DC | PRN
  Administered 2015-11-24: 4 mg via INTRAVENOUS

## 2015-11-24 MED ORDER — PROPOFOL 10 MG/ML IV BOLUS
INTRAVENOUS | Status: AC
  Filled 2015-11-24: qty 20

## 2015-11-24 MED ORDER — SODIUM CHLORIDE 0.9 % IR SOLN
Status: DC | PRN
  Administered 2015-11-24: 12000 mL via INTRAVESICAL

## 2015-11-24 MED ORDER — CIPROFLOXACIN HCL 500 MG PO TABS: 500.0000 mg | ORAL_TABLET | Freq: Every day | ORAL | Status: DC

## 2015-11-24 MED ORDER — MIDAZOLAM HCL 5 MG/5ML IJ SOLN: INTRAMUSCULAR | Status: DC | PRN

## 2015-11-24 MED ORDER — LACTATED RINGERS IV SOLN
INTRAVENOUS | Status: DC | PRN
  Administered 2015-11-24: 07:00:00 via INTRAVENOUS

## 2015-11-24 MED ORDER — LIDOCAINE HCL (CARDIAC) 10 MG/ML IV SOLN
INTRAVENOUS | Status: DC | PRN
  Administered 2015-11-24: 50 mg via INTRAVENOUS

## 2015-11-24 MED ORDER — PROPOFOL 10 MG/ML IV BOLUS
INTRAVENOUS | Status: DC | PRN
  Administered 2015-11-24: 100 mg via INTRAVENOUS
  Administered 2015-11-24: 20 mg via INTRAVENOUS

## 2015-11-24 SURGICAL SUPPLY — 12 items
BAG URINE DRAINAGE (UROLOGICAL SUPPLIES) IMPLANT
BAG URO CATCHER STRL LF (MISCELLANEOUS) ×2 IMPLANT
EVACUATOR MICROVAS BLADDER (UROLOGICAL SUPPLIES) IMPLANT
GLOVE BIOGEL M 8.0 STRL (GLOVE) ×2 IMPLANT
GOWN STRL REUS W/TWL XL LVL3 (GOWN DISPOSABLE) ×2 IMPLANT
HOLDER FOLEY CATH W/STRAP (MISCELLANEOUS) IMPLANT
KIT ASPIRATION TUBING (SET/KITS/TRAYS/PACK) ×1 IMPLANT
LOOP CUT BIPOLAR 24F LRG (ELECTROSURGICAL) ×2 IMPLANT
MANIFOLD NEPTUNE II (INSTRUMENTS) ×2 IMPLANT
NS IRRIG 1000ML POUR BTL (IV SOLUTION) ×1 IMPLANT
PACK CYSTO (CUSTOM PROCEDURE TRAY) ×2 IMPLANT
TUBING CONNECTING 10 (TUBING) ×2 IMPLANT

## 2015-11-24 NOTE — H&P (Signed)
[name] is a 75 year old male with a history of bladder cancer.   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 He was not a surgical candidate at that time so I placed him on medical management after I first saw him and noted his creatinine improved slightly and since then he says his urination has remained stable. He did see some improvement after starting the medication. Cystoscopy in 10/13 revealed no significant prostatic obstruction.      Prostate nodule: He has a nodule in the apex of the prostate on the left side that has remained unchanged over the years. In addition his PSA has been found to be completely normal and stable as well.    Gross hematuria: He was experiencing mild dysuria and was placed on empiric antibiotics with resolution of his hematuria. In 4/13 he underwent a full hematuria workup with CT scan and cystoscopy which were found to be negative. At that time my feeling was that his hematuria was secondary to a UTI.     Incomplete bladder emptying/urinary retention: He was experiencing dysuria, frequency and lower abdominal pain and was found to have a PVR of 468 cc. A Foley catheter was inserted.  Urodynamics 10/13: He was found to have a hypotonic, large capacity bladder with no significant evidence of outlet obstruction.  Treatment: CIC 4 times a day and prior to bed and tamsulosin. (Catheter requirement #200/month)     Transitional cell carcinoma of the bladder: Renal ultrasound done on 12/25/14 revealed no evidence of hydronephrosis or renal mass however an abnormality of the posterior wall was noted and found cystoscopically to be a bladder tumor.  TURBT 03/03/15: Extensive tumor was resected from the floor and right wall of the bladder including from a bladder diverticulum.  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  Pathology: Similar findings with muscularis present and uninvolved (T1,G3)  Induction course of BCG completed 10/16.    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  Treatment: Nitrofurantoin low-dose nightly prophylaxis    Interval history: He has been doing well. He continues to perform self-catheterization and on rare occasion will see a small amount of blood with catheterization. He denies any new voiding symptoms. Specifically he is not having any suprapubic discomfort or fever.    Past Medical History Problems  1. History of Acute Myocardial Infarction 2. History of Arthritis 3. History of Coagulation Defects 4. History of diabetes mellitus (Z86.39) 5. History of heartburn (Z87.898) 6. History of hypercholesterolemia (Z86.39) 7. History of hypertension (Z86.79) 8. Primary transitional cell carcinoma of bladder (C67.9)  Surgical History Problems  1. History of Ankle Repair 2. History of Coronary Artery Triple Arterial Bypass Graft 3. History of Cystoscopy With Fulguration Large Lesion (Over 5cm) 4. History of Cystoscopy With Fulguration Large Lesion (Over 5cm) 5. History of Laminectomy Cervical  Current Meds 1. Acetaminophen 325 MG Oral Tablet;  Therapy: (Recorded:30Mar2015) to Recorded 2. AmLODIPine Besylate 10 MG Oral Tablet;  Therapy: (Recorded:25Apr2013) to Recorded 3. Aspirin EC Low Dose 81 MG Oral Tablet Delayed Release;  Therapy: (Recorded:29Mar2016) to Recorded 4. Atorvastatin Calcium 20 MG Oral Tablet;  Therapy: (Recorded:04Nov2015) to Recorded 5. Colchicine 0.6 MG Oral Capsule;  Therapy: (Recorded:26May2016) to Recorded 6. Furosemide 20 MG Oral Tablet;  Therapy: (Recorded:04Nov2015)  to Recorded 7. GlipiZIDE XL 10 MG Oral Tablet Extended Release 24 Hour;  Therapy: (Recorded:04Nov2015) to Recorded 8. HydrALAZINE  HCl - 50 MG Oral Tablet;  Therapy: (Recorded:29Mar2016) to Recorded 9. Irbesartan 300 MG Oral Tablet;  Therapy: (Recorded:04Nov2015) to Recorded 10. Isosorbide Mononitrate 120 MG TBCR;   Therapy: (Recorded:29Mar2016) to Recorded 11. Lantus SoloStar 100 UNIT/ML SOLN;   Therapy: (Recorded:29Mar2016) to Recorded 12. Metoprolol Succinate ER 50 MG Oral Tablet Extended Release 24 Hour;   Therapy: (Recorded:25Apr2013) to Recorded 13. Multi Vitamin/Minerals TABS;   Therapy: (Recorded:29Mar2016) to Recorded 14. Nitrofurantoin Monohyd Macro 100 MG Oral Capsule; Take 1 capsule by mouth at   bedtime;   Therapy: 26Sep2016 to (Complete:25Mar2017)  Requested for: 26Sep2016; Last   Rx:26Sep2016 Ordered 15. Nitrostat 0.4 MG Sublingual Tablet Sublingual;   Therapy: (Recorded:25Apr2013) to Recorded 16. Pantoprazole Sodium 40 MG Oral Tablet Delayed Release;   Therapy: (Recorded:29Mar2016) to Recorded 17. Plavix 75 MG Oral Tablet;   Therapy: (Recorded:29Mar2016) to Recorded 18. Torsemide 20 MG Oral Tablet;   Therapy: (Recorded:29Mar2016) to Recorded 19. Vitamin D 1000 UNIT Oral Tablet;   Therapy: (Recorded:25Apr2013) to Recorded  Allergies Medication  1. No Known Drug Allergies  Family History Problems  1. No pertinent family history : Mother  Social History Problems  1. Caffeine Use   2 2. Former smoker [phone]) 3. History of Tobacco Use   quit; smoked two packs per day for 50 years  Review of Systems Genitourinary, constitutional, skin, eye, otolaryngeal, hematologic/lymphatic, cardiovascular, pulmonary, endocrine, musculoskeletal, gastrointestinal, neurological and psychiatric system(s) were reviewed and pertinent findings if present are noted.  Genitourinary:hematuria.    Vitals Vital Signs  Height: 5 ft 6 in Weight: 156 lb  BMI Calculated: 25.18 BSA Calculated: 1.8 Blood Pressure: 138 / 63 Heart Rate: 55  Physical Exam Constitutional: Well nourished and well developed . No  acute distress.  ENT:. The ears and nose are normal in appearance.  Neck: The appearance of the neck is normal and no neck mass is present.  Pulmonary: No respiratory distress and normal respiratory rhythm and effort.  Cardiovascular: Heart rate and rhythm are normal . No peripheral edema.  Abdomen: The abdomen is soft and nontender. No masses are palpated. No CVA tenderness. No hernias are palpable. No hepatosplenomegaly noted.  Rectal: Rectal exam demonstrates normal sphincter tone, no tenderness and no masses. The prostate has no nodularity and is not tender. The left seminal vesicle is nonpalpable. The right seminal vesicle is nonpalpable. The perineum is normal on inspection.  Genitourinary: Examination of the penis demonstrates no discharge, no masses, no lesions and a normal meatus. The scrotum is without lesions. Examination of the right scrotum demonstrates a hydrocele. The right epididymis is palpably normal and non-tender. The left epididymis is palpably normal and non-tender. The right testis is non-tender and without masses. The left testis is non-tender and without masses.  Lymphatics: The femoral and inguinal nodes are not enlarged or tender.  Skin: Normal skin turgor, no visible rash and no visible skin lesions.  Neuro/Psych:. Mood and affect are appropriate.   Procedure: Cystoscopy performed on 11/04/15  Indication: History of Urothelial Carcinoma. Frequent UTI.  Informed Consent: Risks, benefits, and potential adverse events were discussed and informed consent was obtained from the patient.  Prep: The patient was prepped with betadine.  Anesthesia:. Local anesthesia was administered intraurethrally with 2% lidocaine jelly.  Procedure Note:  Urethral meatus:. No abnormalities.  Anterior urethra: No abnormalities.  Prostatic urethra: No abnormalities.  Bladder: Visulization was clear. The ureteral  orifices were in the normal anatomic position bilaterally and had clear efflux of  urine. A solitary tumor was visualized in the bladder. A papillary tumor was seen in the bladder. This tumor was located on the right side, at the base of the bladder. The patient tolerated the procedure well.  Complications: None.    Assessment He had a obvious papillary recurrence on the base of the lateral right hand side. I therefore have recommended transurethral resection of this area. We discussed the procedure and its risks and complications.  He also had pyuria with bacteria so his urine will be cultured in preparation for his upcoming surgery.   Plan 1. Culture urine 11/12/15 - negative.  2. He will be off of his Plavix for 7 days prior to his surgery.  3. TURBT

## 2015-11-24 NOTE — Op Note (Signed)
PATIENT:  [name]  PRE-OPERATIVE DIAGNOSIS: Bladder tumor  POST-OPERATIVE DIAGNOSIS: Same  PROCEDURE:  Procedure(s): TRANSURETHRAL RESECTION OF BLADDER TUMOR (TURBT) (2.5cm.)   SURGEON:  Surgeon(s): Claybon Jabs  ANESTHESIA:   General  EBL:  Minimal  DRAINS: none  SPECIMEN:  Bladder tumor  DISPOSITION OF SPECIMEN:  PATHOLOGY  Indication:  [name] is a 75 year old male with a history of transitional cell carcinoma of the bladder. He was found to have cancer involving the floor of his bladder and a bladder diverticulum on the right-hand side. He underwent to resections in order to completely clear him of all of the tumor from his bladder and from the diverticulum. At the time of his second procedure the entire inner surface of the diverticulum was fulgurated. He then underwent an induction course of BCG and on follow-up cystoscopy was found to have recurrence in the area of his previous tumor. He therefore is brought to the operating room today for repeat resection.  Description of operation: The patient was taken to the operating room and administered general anesthesia. They were then placed on the table and moved to the dorsal lithotomy position after which the genitalia was sterilely prepped and draped. An official timeout was then performed.  The 25 French resectoscope with the 30 lens and visual obturator were then passed into the bladder under direct visualization. Urethra appeared normal. The visual obturator was then removed and the Gyrus resectoscope element with 30  lens was then inserted and the bladder was fully and systematically inspected. Ureteral orifices were noted to be in the normal anatomic positions. Of note his right ureteral orifice was easily identified and photographed. It was not associated with any evidence of recurrent tumor. He did however have tumor involving the right lateral wall and extending out toward the level of the bladder neck and  medially between the trigone and bladder neck on the right-hand side. I was unable to identify his previous bladder diverticulum.  I first began by resecting visible tumor beginning on the right-hand side and extending out to the bladder neck. I resected all of the visible tumor and also resected into the prostatic urethra a short distance. The right ureteral orifice was spared and in addition the diverticulum was no longer present.   Reinspection of the bladder revealed all obvious tumor had been fully resected and there was no evidence of perforation. The Microvasive evacuator was then used to irrigate the bladder and remove all of the portions of bladder tumor which were sent to pathology. I then removed the resectoscope.  The patient performs intermittent self-catheterization. He requested that a catheter not be left if possible and because there was no bleeding noted at the end of the procedure I elected not to leave a Foley catheter.     PLAN OF CARE: Discharge to home after PACU  PATIENT DISPOSITION:  PACU - hemodynamically stable.

## 2015-11-24 NOTE — Anesthesia Postprocedure Evaluation (Addendum)
Anesthesia Post Note  Patient: [name]  Procedure(s) Performed: Procedure(s) (LRB): TRANSURETHRAL RESECTION OF BLADDER TUMOR WITH GYRUS (TURBT-GYRUS) (N/A)  Patient location during evaluation: PACU Anesthesia Type: General Level of consciousness: awake and alert Pain management: pain level controlled Vital Signs Assessment: post-procedure vital signs reviewed and stable Respiratory status: spontaneous breathing, nonlabored ventilation, respiratory function stable and patient connected to nasal cannula oxygen Cardiovascular status: blood pressure returned to baseline and stable Postop Assessment: no signs of nausea or vomiting Anesthetic complications: no Comments: No apnea and no sedation in PACU    Last Vitals:  Filed Vitals:   11/24/15 0930 11/24/15 0945  BP: 142/49 169/61  Pulse: 57 62  Temp: 36.6 C 36.6 C  Resp: 12 12    Last Pain:  Filed Vitals:   11/24/15 0949  PainSc: 0-No pain                 Chrstopher Malenfant J

## 2015-11-24 NOTE — Transfer of Care (Signed)
Immediate Anesthesia Transfer of Care Note  Patient: [name]  Procedure(s) Performed: Procedure(s): TRANSURETHRAL RESECTION OF BLADDER TUMOR WITH GYRUS (TURBT-GYRUS) (N/A)  Patient Location: PACU  Anesthesia Type:General  Level of Consciousness: awake, alert , oriented and patient cooperative  Airway & Oxygen Therapy: Patient Spontanous Breathing and Patient connected to face mask oxygen  Post-op Assessment: Report given to RN, Post -op Vital signs reviewed and stable and Patient moving all extremities X 4  Post vital signs: stable  Last Vitals:  Filed Vitals:   11/24/15 0536 11/24/15 0831  BP: 161/68 168/80  Pulse: 62 66  Temp: 36.2 C 36.6 C  Resp: 18 12    Complications: No apparent anesthesia complications

## 2015-11-24 NOTE — Progress Notes (Signed)
Feels the need to urinate. Usual practice to self cath, unable to use urinal. Will return to Short Stay and do self cath.

## 2015-11-24 NOTE — Discharge Instructions (Signed)
Transurethral Resection of Bladder Tumor (TURBT)   Definition:  Transurethral Resection of the Bladder Tumor is a surgical procedure used to diagnose and remove tumors within the bladder. TURBT is the most common treatment for early stage bladder cancer.  General instructions:     Your recent bladder surgery requires very little post hospital care but some definite precautions.  Despite the fact that no skin incisions were used, the area around the bladder incisions are raw and covered with scabs to promote healing and prevent bleeding. Certain precautions are needed to insure that the scabs are not disturbed over the next 2-4 weeks while the healing proceeds.  Because the raw surface inside your bladder and the irritating effects of urine you may expect frequency of urination and/or urgency (a stronger desire to urinate) and perhaps even getting up at night more often. This will usually resolve or improve slowly over the healing period. You may see some blood in your urine over the first 6 weeks. Do not be alarmed, even if the urine was clear for a while. Get off your feet and drink lots of fluids until clearing occurs. If you start to pass clots or don't improve call us.  Catheter: (If you are discharged with a catheter.)  1. Keep your catheter secured to your leg at all times with tape or the supplied strap. 2. You may experience leakage of urine around your catheter- as long as the  catheter continues to drain, this is normal.  If your catheter stops draining  go to the ER. 3. You may also have blood in your urine, even after it has been clear for  several days; you may even pass some small blood clots or other material.  This  is normal as well.  If this happens, sit down and drink plenty of water to help  make urine to flush out your bladder.  If the blood in your urine becomes worse  after doing this, contact our office or return to the ER. 4. You may use the leg bag (small bag)  during the day, but use the large bag at  night.  Diet:  You may return to your normal diet immediately. Because of the raw surface of your bladder, alcohol, spicy foods, foods high in acid and drinks with caffeine may cause irritation or frequency and should be used in moderation. To keep your urine flowing freely and avoid constipation, drink plenty of fluids during the day (8-10 glasses). Tip: Avoid cranberry juice because it is very acidic.  Activity:  Your physical activity doesn't need to be restricted. However, if you are very active, you may see some blood in the urine. We suggest that you reduce your activity under the circumstances until the bleeding has stopped.  Bowels:  It is important to keep your bowels regular during the postoperative period. Straining with bowel movements can cause bleeding. A bowel movement every other day is reasonable. Use a mild laxative if needed, such as milk of magnesia 2-3 tablespoons, or 2 Dulcolax tablets. Call if you continue to have problems. If you had been taking narcotics for pain, before, during or after your surgery, you may be constipated. Take a laxative if necessary.    Medication:  You should resume your pre-surgery medications unless told not to. In addition you may be given an antibiotic to prevent or treat infection. Antibiotics are not always necessary. All medication should be taken as prescribed until the bottles are finished unless you are having  an unusual reaction to one of the drugs.   General Anesthesia, Adult, Care After Refer to this sheet in the next few weeks. These instructions provide you with information on caring for yourself after your procedure. Your health care provider may also give you more specific instructions. Your treatment has been planned according to current medical practices, but problems sometimes occur. Call your health care provider if you have any problems or questions after your procedure. WHAT TO  EXPECT AFTER THE PROCEDURE After the procedure, it is typical to experience:  Sleepiness.  Nausea and vomiting. HOME CARE INSTRUCTIONS  For the first 24 hours after general anesthesia:  Have a responsible person with you.  Do not drive a car. If you are alone, do not take public transportation.  Do not drink alcohol.  Do not take medicine that has not been prescribed by your health care provider.  Do not sign important papers or make important decisions.  You may resume a normal diet and activities as directed by your health care provider.  Change bandages (dressings) as directed.  If you have questions or problems that seem related to general anesthesia, call the hospital and ask for the anesthetist or anesthesiologist on call. SEEK MEDICAL CARE IF:  You have nausea and vomiting that continue the day after anesthesia.  You develop a rash. SEEK IMMEDIATE MEDICAL CARE IF:   You have difficulty breathing.  You have chest pain.  You have any allergic problems.   This information is not intended to replace advice given to you by your health care provider. Make sure you discuss any questions you have with your health care provider.   Document Released: 01/10/2001 Document Revised: 10/25/2014 Document Reviewed: 02/02/2012 Elsevier Interactive Patient Education [employer].

## 2015-11-24 NOTE — Anesthesia Procedure Notes (Signed)
Procedure Name: LMA Insertion Date/Time: 11/24/2015 7:36 AM Performed by: Lissa Morales Pre-anesthesia Checklist: Patient identified, Emergency Drugs available, Suction available and Patient being monitored Patient Re-evaluated:Patient Re-evaluated prior to inductionOxygen Delivery Method: Circle System Utilized Preoxygenation: Pre-oxygenation with 100% oxygen Intubation Type: IV induction Ventilation: Mask ventilation without difficulty LMA: LMA with gastric port inserted LMA Size: 4.0 Tube type: Oral (oral sump inserted through gastric port) Number of attempts: 1 Airway Equipment and Method: Stylet and Oral airway Placement Confirmation: ETT inserted through vocal cords under direct vision,  positive ETCO2 and breath sounds checked- equal and bilateral Tube secured with: Tape Dental Injury: Teeth and Oropharynx as per pre-operative assessment

## 2015-11-24 NOTE — Anesthesia Preprocedure Evaluation (Addendum)
Anesthesia Evaluation  Patient identified by MRN, date of birth, ID band Patient awake    Reviewed: Allergy & Precautions, NPO status , Patient's Chart, lab work & pertinent test results  Airway Mallampati: II  TM Distance: >3 FB Neck ROM: Full    Dental  (+) Edentulous Lower, Edentulous Upper   Pulmonary shortness of breath, sleep apnea , COPD, former smoker,    Pulmonary exam normal breath sounds clear to auscultation       Cardiovascular hypertension, Pt. on medications and Pt. on home beta blockers + angina + CAD, + Past MI, + Peripheral Vascular Disease and +CHF  Normal cardiovascular exam+ dysrhythmias  Rhythm:Regular Rate:Normal     Neuro/Psych  Headaches, PSYCHIATRIC DISORDERS  Neuromuscular disease    GI/Hepatic Neg liver ROS, hiatal hernia, GERD  Medicated,  Endo/Other  diabetes, Type 2, Oral Hypoglycemic Agents, Insulin Dependent  Renal/GU Renal diseaseCr 1.86 K 5.3  negative genitourinary   Musculoskeletal  (+) Arthritis ,   Abdominal   Peds negative pediatric ROS (+)  Hematology  (+) anemia ,   Anesthesia Other Findings   Reproductive/Obstetrics negative OB ROS                           Anesthesia Physical Anesthesia Plan  ASA: III  Anesthesia Plan: General   Post-op Pain Management:    Induction: Intravenous  Airway Management Planned: LMA  Additional Equipment:   Intra-op Plan:   Post-operative Plan: Extubation in OR  Informed Consent: I have reviewed the patients History and Physical, chart, labs and discussed the procedure including the risks, benefits and alternatives for the proposed anesthesia with the patient or authorized representative who has indicated his/her understanding and acceptance.   Dental advisory given  Plan Discussed with: CRNA  Anesthesia Plan Comments:         Anesthesia Quick Evaluation

## 2015-11-24 NOTE — Progress Notes (Signed)
Spoke to dr Karsten Ro re: when pt should restart plavix, restart on Wednesday (feb 8) if urine clear, if unclear, or any questions call dr Karsten Ro.  Pt and family informed

## 2015-12-01 DIAGNOSIS — I10 Essential (primary) hypertension: Secondary | ICD-10-CM | POA: Diagnosis not present

## 2015-12-01 DIAGNOSIS — Z794 Long term (current) use of insulin: Secondary | ICD-10-CM | POA: Diagnosis not present

## 2015-12-01 DIAGNOSIS — N183 Chronic kidney disease, stage 3 (moderate): Secondary | ICD-10-CM | POA: Diagnosis not present

## 2015-12-01 DIAGNOSIS — E782 Mixed hyperlipidemia: Secondary | ICD-10-CM | POA: Diagnosis not present

## 2015-12-01 DIAGNOSIS — Z7984 Long term (current) use of oral hypoglycemic drugs: Secondary | ICD-10-CM | POA: Diagnosis not present

## 2015-12-01 DIAGNOSIS — E1165 Type 2 diabetes mellitus with hyperglycemia: Secondary | ICD-10-CM | POA: Diagnosis not present

## 2015-12-04 ENCOUNTER — Other Ambulatory Visit: Payer: Self-pay | Admitting: Cardiovascular Disease

## 2015-12-04 DIAGNOSIS — Z79899 Other long term (current) drug therapy: Secondary | ICD-10-CM | POA: Diagnosis not present

## 2015-12-04 DIAGNOSIS — C678 Malignant neoplasm of overlapping sites of bladder: Secondary | ICD-10-CM | POA: Diagnosis not present

## 2015-12-08 DIAGNOSIS — N323 Diverticulum of bladder: Secondary | ICD-10-CM | POA: Diagnosis not present

## 2015-12-08 DIAGNOSIS — C678 Malignant neoplasm of overlapping sites of bladder: Secondary | ICD-10-CM | POA: Diagnosis not present

## 2015-12-08 DIAGNOSIS — C679 Malignant neoplasm of bladder, unspecified: Secondary | ICD-10-CM | POA: Diagnosis not present

## 2015-12-09 ENCOUNTER — Encounter: Payer: Self-pay | Admitting: Interventional Cardiology

## 2015-12-09 ENCOUNTER — Ambulatory Visit (INDEPENDENT_AMBULATORY_CARE_PROVIDER_SITE_OTHER): Payer: Medicare Other | Admitting: Interventional Cardiology

## 2015-12-09 ENCOUNTER — Other Ambulatory Visit: Payer: Self-pay | Admitting: Cardiovascular Disease

## 2015-12-09 VITALS — BP 164/72 | HR 60 | Ht 66.0 in | Wt 163.2 lb

## 2015-12-09 DIAGNOSIS — I5032 Chronic diastolic (congestive) heart failure: Secondary | ICD-10-CM | POA: Diagnosis not present

## 2015-12-09 DIAGNOSIS — I6523 Occlusion and stenosis of bilateral carotid arteries: Secondary | ICD-10-CM | POA: Diagnosis not present

## 2015-12-09 DIAGNOSIS — I10 Essential (primary) hypertension: Secondary | ICD-10-CM | POA: Diagnosis not present

## 2015-12-09 DIAGNOSIS — N183 Chronic kidney disease, stage 3 unspecified: Secondary | ICD-10-CM

## 2015-12-09 DIAGNOSIS — F17200 Nicotine dependence, unspecified, uncomplicated: Secondary | ICD-10-CM

## 2015-12-09 DIAGNOSIS — I25708 Atherosclerosis of coronary artery bypass graft(s), unspecified, with other forms of angina pectoris: Secondary | ICD-10-CM | POA: Diagnosis not present

## 2015-12-09 DIAGNOSIS — M10071 Idiopathic gout, right ankle and foot: Secondary | ICD-10-CM

## 2015-12-09 DIAGNOSIS — I110 Hypertensive heart disease with heart failure: Secondary | ICD-10-CM | POA: Diagnosis not present

## 2015-12-09 DIAGNOSIS — I739 Peripheral vascular disease, unspecified: Secondary | ICD-10-CM

## 2015-12-09 MED ORDER — COLCHICINE 0.6 MG PO TABS: 0.6000 mg | ORAL_TABLET | Freq: Two times a day (BID) | ORAL | Status: DC

## 2015-12-09 NOTE — Patient Instructions (Addendum)
Medication Instructions:   Your physician has recommended you make the following change in your medication:  START Colchicine 0.6mg  twice daily. An Rx has been sent to your pharmacy   Labwork: None ordered    Testing/Procedures: None ordered    Follow-Up: Your physician wants you to follow-up in: 6 months You will receive a reminder letter in the mail two months in advance. If you don't receive a letter, please call our office to schedule the follow-up appointment.     Any Other Special Instructions Will Be Listed Below (If Applicable).     If you need a refill on your cardiac medications before your next appointment, please call your pharmacy.

## 2015-12-09 NOTE — Progress Notes (Signed)
Cardiology Office Note   Date:  12/09/2015   ID:  [name], DOB [birth year], MRN [id number]  PCP:  Shirline Frees, MD  Cardiologist:  Sinclair Grooms, MD   Chief Complaint  Patient presents with  . Coronary Artery Disease      History of Present Illness: [name] is a 75 y.o. male who presents for  Follow-up of ischemic cardiomyopathy, chronic combined systolic and diastolic heart failure, hyperlipidemia, essential hypertension, chronic kidney disease, and peripheral arterial disease with claudication.   On this occasion ,[name] is accompanied by his wife and has no cardiopulmonary complaints. He has occasional use of nitroglycerin. He denies orthopnea and PND. He has not had syncope and does have claudication but this does not limit his activities of daily living. No recent hospital stay for cardiac issues but did have admission for hematuria.    Past Medical History  Diagnosis Date  . Hyperlipidemia   . Hypertension   . GERD (gastroesophageal reflux disease)   . Esophageal stricture     a. History of dilitation - Long-standing history of esophageal reflux   . 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 1999: LIMA to LAD, sequential SVG to diagonal 2 and diagonal 3, sequential SVG to PDA, acute marginal branch, and PL branch. b. Repeat CABG 2015 with SVG to D1, SVG to PDA, and SVG to OM.   Marland Kitchen Chronic combined systolic and diastolic CHF (congestive heart failure) ([place])     a. Prior EF 45-50% in 08/2014. b. Improved EF >55% in 09/2014 at [facility].  . Type II diabetes mellitus ([place])   . Anemia     a. + FOBT 08/2014.  Marland Kitchen History of hiatal hernia   . Arthritis   . Chronic lower back pain   . CKD (chronic kidney disease) stage 3, GFR 30-59 ml/min   . Skin cancer   . Polymyalgia rheumatica ([place])   . SVT (supraventricular tachycardia) ([place])   . Atrial fibrillation ([place])   . Carotid artery occlusion     a. Duplex 12/2013: mild plaque bilat, 40-59% BICA. F/u due 12/2014.  Marland Kitchen  Atherosclerosis of native arteries of the extremities with intermittent claudication     a. L external iliac stent 2001, bilateral SFA occlusions documented 2001.  Marland Kitchen OSA (obstructive sleep apnea)     "don't wear mask"   . Hypotonic bladder     Requiring in and out catheterization performed by the patient at home - occasional blood tinged urine (self-cath's daily for the last 2 years) and hematuria is chronic for him, followed by urology.  . MI (myocardial infarction) ([place]) 1999-02/2014    total of 7 MI per patient  . COPD (chronic obstructive pulmonary disease) ([place])     pt states he doesn't have  . CAD (coronary artery disease), native coronary artery 09/24/2013    a. CABG x 6 1999. b. Interim PCIs: Angioplasty SVG-PDA 2006.Taxus DES to native LCx in 2007.DES to SVG-PDA 2007, restented 2009.NSTEMI with PCI to SVG- PDA complicated by no-reflow/inferior infarction in 2012. c. Redo CABG 2015 with SVG-PD, SVG-D1, SVG-OM1.  . History of gout   . Bladder cancer ([place])   . Self-catheterizes urinary bladder   . Anginal pain ([place])     comes and goes; uses nitro to relieve   . Dysrhythmia   . Shortness of breath dyspnea   . History of bronchitis   . Daily headache     comes and goes pt denies daily currently   .  Gait instability     Past Surgical History  Procedure Laterality Date  . Coronary stent placement      "I've had 8 put in; I presently have none" (08/26/2014)  . Cardiac catheterization    . Intraoperative transesophageal echocardiogram N/A 03/01/2014    Procedure: INTRAOPERATIVE TRANSESOPHAGEAL ECHOCARDIOGRAM;  Surgeon: Gaye Pollack, MD;  Location: [facility] OR;  Service: Open Heart Surgery;  Laterality: N/A;  . Anterior cervical decomp/discectomy fusion    . Ankle fracture surgery Right ~ 1977    "10 plates and 7 screws placed in it"  . Tonsillectomy    . Cataract extraction w/ intraocular lens  implant, bilateral Bilateral 2000's  . Mohs surgery  X 2    "nose; nose"  . Skin cancer  excision      "all over my head; ears; back  . Esophagogastroduodenoscopy (egd) with esophageal dilation  X 1  . Coronary artery bypass graft  1999  . Coronary artery bypass graft N/A 03/01/2014    Procedure: REDO CORONARY ARTERY BYPASS GRAFTING TIMES THREE, USING RIGHT GREATER SAPHENOUS VEIN VIA ENDOVEIN HARVEST.;  Surgeon: Gaye Pollack, MD;  Location: [facility] OR;  Service: Open Heart Surgery;  Laterality: N/A;  . Left heart catheterization with coronary/graft angiogram  02/26/2014    Procedure: LEFT HEART CATHETERIZATION WITH Beatrix Fetters;  Surgeon: Sinclair Grooms, MD;  Location: [facility] CATH LAB;  Service: Cardiovascular;;  . Left heart catheterization with coronary/graft angiogram N/A 07/23/2014    Procedure: LEFT HEART CATHETERIZATION WITH Beatrix Fetters;  Surgeon: Sinclair Grooms, MD;  Location: [facility] CATH LAB;  Service: Cardiovascular;  Laterality: N/A;  . Left heart catheterization with coronary/graft angiogram N/A 08/30/2014    Procedure: LEFT HEART CATHETERIZATION WITH Beatrix Fetters;  Surgeon: Leonie Man, MD;  Location: [facility] CATH LAB;  Service: Cardiovascular;  Laterality: N/A;  . Abdominal aortagram N/A 11/27/2014    Procedure: ABDOMINAL Maxcine Ham;  Surgeon: Wellington Hampshire, MD;  Location: [facility] CATH LAB;  Service: Cardiovascular;  Laterality: N/A;  . Multiple tooth extractions    . Colonoscopy    . Endarterectomy Left 01/30/2015    Procedure: ENDARTERECTOMY CAROTID WITH PRIMARY CLOSURE OF ARTERY;  Surgeon: Angelia Mould, MD;  Location: [facility];  Service: Vascular;  Laterality: Left;  . Back surgery      neck surgery x 2  . Fracture surgery Right     ankle  . Cystoscopy w/ retrogrades Bilateral 03/03/2015    Procedure: CYSTOSCOPY WITH LEFT RETROGRADE PYELOGRAM;  Surgeon: Kathie Rhodes, MD;  Location: [facility] ORS;  Service: Urology;  Laterality: Bilateral;  . Transurethral resection of bladder tumor with gyrus (turbt-gyrus) N/A 03/03/2015    Procedure:  TRANSURETHRAL RESECTION OF BLADDER TUMOR;  Surgeon: Kathie Rhodes, MD;  Location: [facility] ORS;  Service: Urology;  Laterality: N/A;  . Transurethral resection of bladder tumor with gyrus (turbt-gyrus) N/A 04/28/2015    Procedure: TRANSURETHRAL RESECTION OF BLADDER TUMOR ;  Surgeon: Kathie Rhodes, MD;  Location: [facility] ORS;  Service: Urology;  Laterality: N/A;  . Eye surgery      cataract surgery bilat   . Transurethral resection of bladder tumor with gyrus (turbt-gyrus) N/A 11/24/2015    Procedure: TRANSURETHRAL RESECTION OF BLADDER TUMOR WITH GYRUS (TURBT-GYRUS);  Surgeon: Kathie Rhodes, MD;  Location: [facility] ORS;  Service: Urology;  Laterality: N/A;     Current Outpatient Prescriptions  Medication Sig Dispense Refill  . acetaminophen (TYLENOL) 500 MG tablet Take 1,000 mg by mouth every 6 (six) hours as needed  for moderate pain.    Marland Kitchen amLODipine (NORVASC) 10 MG tablet Take 10 mg by mouth daily as needed (High BP).    Marland Kitchen aspirin EC 81 MG EC tablet Take 1 tablet (81 mg total) by mouth daily. 1 tablet 0  . atorvastatin (LIPITOR) 20 MG tablet Take 20 mg by mouth daily at 6 PM.     . clopidogrel (PLAVIX) 75 MG tablet Take 1 tablet (75 mg total) by mouth daily. 90 tablet 3  . Cranberry 500 MG CAPS Take 1 capsule by mouth at bedtime.    . ferrous sulfate 325 (65 FE) MG tablet Take 325 mg by mouth every evening.     Marland Kitchen glipiZIDE (GLUCOTROL) 10 MG tablet Take 1 tablet (10 mg total) by mouth 2 (two) times daily before a meal. 60 tablet 0  . hydrALAZINE (APRESOLINE) 50 MG tablet Take 50 mg by mouth 3 (three) times daily as needed (Will only take when blood pressure is running high. Will at least take once daily). 90 tablet 6  . Insulin Glargine (LANTUS) 100 UNIT/ML Solostar Pen Inject 10 Units into the skin every morning. (Patient taking differently: Inject 10 Units into the skin daily with breakfast. ) 15 mL 1  . isosorbide mononitrate (IMDUR) 120 MG 24 hr tablet TAKE 1 TABLET BY MOUTH EVERY DAY 30 tablet 5  . metoprolol  (LOPRESSOR) 50 MG tablet Take 1 tablet (50 mg total) by mouth 2 (two) times daily. 60 tablet 11  . Multiple Vitamin (MULTIVITAMIN WITH MINERALS) TABS tablet Take 1 tablet by mouth daily.    . nitroGLYCERIN (NITROSTAT) 0.4 MG SL tablet PLACE 1 TABLET UNDER THE TONGUE EVERY 5 MINUTES AS NEEDED FOR CHEST PAIN 25 tablet 1  . pantoprazole (PROTONIX) 40 MG tablet TAKE 1 TABLET BY MOUTH DAILY 90 tablet 3  . torsemide (DEMADEX) 20 MG tablet Take 2 tablets by mouth once a day 60 tablet 6   No current facility-administered medications for this visit.    Allergies:   Review of patient's allergies indicates no known allergies.    Social History:  The patient  reports that he quit smoking about 2 months ago. His smoking use included Cigarettes. He has a 100 pack-year smoking history. He has never used smokeless tobacco. He reports that he does not drink alcohol or use illicit drugs.   Family History:  The patient's family history includes CAD in his mother; Cirrhosis in his brother; Heart attack in his father; Heart disease in his father and mother; Hyperlipidemia in his father; Hypertension in his father and mother.    ROS:  Please see the history of present illness.   Otherwise, review of systems are positive for  Having a flare of gout involving the right great toe and ankle. Recent hospitalization for  hematuria. Appetite is been stable. No nausea or vomiting..   All other systems are reviewed and negative.    PHYSICAL EXAM: VS:  BP 164/72 mmHg  Pulse 60  Ht 5\' 6"  (1.676 m)  Wt 163 lb 3.2 oz (74.027 kg)  BMI 26.35 kg/m2 , BMI Body mass index is 26.35 kg/(m^2). GEN: Well nourished, well developed, in no acute distress HEENT: normal Neck: no JVD, carotid bruits, or masses Cardiac: RRR.  There is no murmur, rub, or gallop. There is no edema. Respiratory:  clear to auscultation bilaterally, normal work of breathing. GI: soft, nontender, nondistended, + BS MS: no deformity or atrophy Skin: warm  and dry, no rash Neuro:  Strength and sensation  are intact Psych: euthymic mood, full affect   EKG:  EKG is not ordered today.   Recent Labs: 01/22/2015: ALT 37 11/19/2015: BUN 36*; Creatinine, Ser 1.86*; Hemoglobin 13.7; Platelets 230; Potassium 5.3*; Sodium 134*    Lipid Panel    Component Value Date/Time   CHOL 133 09/03/2014 0403   TRIG 138 09/03/2014 0403   HDL 41 09/03/2014 0403   CHOLHDL 3.2 09/03/2014 0403   VLDL 28 09/03/2014 0403   LDLCALC 64 09/03/2014 0403      Wt Readings from Last 3 Encounters:  12/09/15 163 lb 3.2 oz (74.027 kg)  11/24/15 165 lb (74.844 kg)  11/19/15 165 lb (74.844 kg)      Other studies Reviewed: Additional studies/ records that were reviewed today include:  Reviewed recent hospital/ urology notes. The findings include  Has a bladder tumor with hematuria..    ASSESSMENT AND PLAN:  1. Coronary artery disease involving coronary bypass graft of native heart with other forms of angina pectoris ([place])  stable without angina pattern change  2. Hypertensive heart disease with heart failure (HCC)  blood pressure has been variable and patient now follows a when necessary medication regimen that has not been recommended a when he takes his medications as directed daily, he has frequent episodes  Dizziness and near fainting  3. Essential hypertension  see above dialogue  4. Chronic diastolic heart failure (HCC)  no volume overload  5. Tobacco use disorder  discontinued  6. Carotid stenosis, bilateral  asymptomatic  7. Claudication (HCC)  nonlimiting  8. CKD (chronic kidney disease) stage 3, GFR 30-59 ml/min   Stable.   9. Gout  flare  Current medicines are reviewed at length with the patient today.  The patient has the following concerns regarding medicines: .  The following changes/actions have been instituted:    Colchicine 0.6 mg by mouth twice a day   other medications will be continued as listed  Labs/ tests ordered today  include:  No orders of the defined types were placed in this encounter.     Disposition:   FU with HS in 6 months  Signed, Sinclair Grooms, MD  12/09/2015 8:50 AM    [facility] [address], [place], [place]  [address] Phone: [phone]; Fax: [phone]

## 2015-12-15 ENCOUNTER — Encounter (HOSPITAL_COMMUNITY): Payer: Self-pay | Admitting: Emergency Medicine

## 2015-12-15 ENCOUNTER — Emergency Department (HOSPITAL_COMMUNITY)
Admission: EM | Admit: 2015-12-15 | Discharge: 2015-12-15 | Disposition: A | Discharge status: Home / Self Care | Payer: Medicare Other | Attending: Emergency Medicine | Admitting: Emergency Medicine

## 2015-12-15 DIAGNOSIS — R319 Hematuria, unspecified: Secondary | ICD-10-CM | POA: Diagnosis not present

## 2015-12-15 DIAGNOSIS — Z8709 Personal history of other diseases of the respiratory system: Secondary | ICD-10-CM | POA: Insufficient documentation

## 2015-12-15 DIAGNOSIS — Z7982 Long term (current) use of aspirin: Secondary | ICD-10-CM | POA: Insufficient documentation

## 2015-12-15 DIAGNOSIS — K219 Gastro-esophageal reflux disease without esophagitis: Secondary | ICD-10-CM | POA: Insufficient documentation

## 2015-12-15 DIAGNOSIS — G8929 Other chronic pain: Secondary | ICD-10-CM | POA: Insufficient documentation

## 2015-12-15 DIAGNOSIS — Z794 Long term (current) use of insulin: Secondary | ICD-10-CM | POA: Insufficient documentation

## 2015-12-15 DIAGNOSIS — Z85828 Personal history of other malignant neoplasm of skin: Secondary | ICD-10-CM | POA: Insufficient documentation

## 2015-12-15 DIAGNOSIS — C679 Malignant neoplasm of bladder, unspecified: Secondary | ICD-10-CM | POA: Insufficient documentation

## 2015-12-15 DIAGNOSIS — I129 Hypertensive chronic kidney disease with stage 1 through stage 4 chronic kidney disease, or unspecified chronic kidney disease: Secondary | ICD-10-CM | POA: Insufficient documentation

## 2015-12-15 DIAGNOSIS — Z87891 Personal history of nicotine dependence: Secondary | ICD-10-CM | POA: Insufficient documentation

## 2015-12-15 DIAGNOSIS — N183 Chronic kidney disease, stage 3 (moderate): Secondary | ICD-10-CM | POA: Insufficient documentation

## 2015-12-15 DIAGNOSIS — M109 Gout, unspecified: Secondary | ICD-10-CM | POA: Insufficient documentation

## 2015-12-15 DIAGNOSIS — Z9861 Coronary angioplasty status: Secondary | ICD-10-CM | POA: Insufficient documentation

## 2015-12-15 DIAGNOSIS — I4891 Unspecified atrial fibrillation: Secondary | ICD-10-CM | POA: Insufficient documentation

## 2015-12-15 DIAGNOSIS — I5042 Chronic combined systolic (congestive) and diastolic (congestive) heart failure: Secondary | ICD-10-CM | POA: Insufficient documentation

## 2015-12-15 DIAGNOSIS — T8189XA Other complications of procedures, not elsewhere classified, initial encounter: Secondary | ICD-10-CM | POA: Diagnosis not present

## 2015-12-15 DIAGNOSIS — Z7902 Long term (current) use of antithrombotics/antiplatelets: Secondary | ICD-10-CM | POA: Insufficient documentation

## 2015-12-15 DIAGNOSIS — Z7984 Long term (current) use of oral hypoglycemic drugs: Secondary | ICD-10-CM | POA: Insufficient documentation

## 2015-12-15 DIAGNOSIS — I25119 Atherosclerotic heart disease of native coronary artery with unspecified angina pectoris: Secondary | ICD-10-CM | POA: Insufficient documentation

## 2015-12-15 DIAGNOSIS — M1990 Unspecified osteoarthritis, unspecified site: Secondary | ICD-10-CM | POA: Insufficient documentation

## 2015-12-15 DIAGNOSIS — D649 Anemia, unspecified: Secondary | ICD-10-CM | POA: Insufficient documentation

## 2015-12-15 DIAGNOSIS — E785 Hyperlipidemia, unspecified: Secondary | ICD-10-CM | POA: Insufficient documentation

## 2015-12-15 DIAGNOSIS — E119 Type 2 diabetes mellitus without complications: Secondary | ICD-10-CM | POA: Insufficient documentation

## 2015-12-15 DIAGNOSIS — I252 Old myocardial infarction: Secondary | ICD-10-CM | POA: Insufficient documentation

## 2015-12-15 DIAGNOSIS — T8131XA Disruption of external operation (surgical) wound, not elsewhere classified, initial encounter: Secondary | ICD-10-CM | POA: Diagnosis not present

## 2015-12-15 DIAGNOSIS — Z9889 Other specified postprocedural states: Secondary | ICD-10-CM | POA: Insufficient documentation

## 2015-12-15 DIAGNOSIS — Z79899 Other long term (current) drug therapy: Secondary | ICD-10-CM | POA: Insufficient documentation

## 2015-12-15 DIAGNOSIS — Z951 Presence of aortocoronary bypass graft: Secondary | ICD-10-CM | POA: Insufficient documentation

## 2015-12-15 LAB — CBC WITH DIFFERENTIAL/PLATELET
BASOS ABS: 0.1 10*3/uL (ref 0.0–0.1)
Basophils Relative: 0 %
EOS PCT: 1 %
Eosinophils Absolute: 0.2 10*3/uL (ref 0.0–0.7)
HCT: 35.8 % — ABNORMAL LOW (ref 39.0–52.0)
Hemoglobin: 11.9 g/dL — ABNORMAL LOW (ref 13.0–17.0)
LYMPHS ABS: 1.4 10*3/uL (ref 0.7–4.0)
Lymphocytes Relative: 9 %
MCH: 28.3 pg (ref 26.0–34.0)
MCHC: 33.2 g/dL (ref 30.0–36.0)
MCV: 85 fL (ref 78.0–100.0)
Monocytes Absolute: 1.1 10*3/uL — ABNORMAL HIGH (ref 0.1–1.0)
Monocytes Relative: 6 %
Neutro Abs: 14.3 10*3/uL — ABNORMAL HIGH (ref 1.7–7.7)
Neutrophils Relative %: 84 %
Platelets: 272 10*3/uL (ref 150–400)
RBC: 4.21 MIL/uL — AB (ref 4.22–5.81)
RDW: 13.3 % (ref 11.5–15.5)
WBC: 17 10*3/uL — AB (ref 4.0–10.5)

## 2015-12-15 LAB — URINALYSIS, ROUTINE W REFLEX MICROSCOPIC
Glucose, UA: 100 mg/dL — AB
Ketones, ur: NEGATIVE mg/dL
Nitrite: POSITIVE — AB
Specific Gravity, Urine: 1.03 (ref 1.005–1.030)
pH: 7 (ref 5.0–8.0)

## 2015-12-15 LAB — BASIC METABOLIC PANEL
Anion gap: 12 (ref 5–15)
BUN: 53 mg/dL — AB (ref 6–20)
CO2: 23 mmol/L (ref 22–32)
CREATININE: 2.18 mg/dL — AB (ref 0.61–1.24)
Calcium: 8.9 mg/dL (ref 8.9–10.3)
Chloride: 105 mmol/L (ref 101–111)
GFR calc Af Amer: 33 mL/min — ABNORMAL LOW (ref >60)
GFR, EST NON AFRICAN AMERICAN: 28 mL/min — AB (ref >60)
Glucose, Bld: 148 mg/dL — ABNORMAL HIGH (ref 65–99)
Potassium: 4.7 mmol/L (ref 3.5–5.1)
SODIUM: 140 mmol/L (ref 135–145)

## 2015-12-15 LAB — URINE MICROSCOPIC-ADD ON: Squamous Epithelial / LPF: NONE SEEN

## 2015-12-15 LAB — ABO/RH: ABO/RH(D): O POS

## 2015-12-15 MED ORDER — SODIUM CHLORIDE 0.9 % IV BOLUS (SEPSIS)
1000.0000 mL | Freq: Once | INTRAVENOUS | Status: AC
  Administered 2015-12-15: 1000 mL via INTRAVENOUS

## 2015-12-15 NOTE — ED Notes (Signed)
Per EMS, patient is from home.  Patient complaining of hematuria, nausea, and abdominal pain.  He had biopsy 11-24-15 on bladder for cancer cells.  He has had no issues up to this point.  Patient is unable to urinate.  Patient states his catheter may be clogged.   BP:120/62 P:64 O2: 99% on room air  CBG: 157

## 2015-12-15 NOTE — ED Notes (Signed)
Bladder scan amount 800 ml

## 2015-12-15 NOTE — ED Notes (Signed)
Bed: [id number] Expected date:  Expected time:  Means of arrival:  Comments: EMS- 47s, post op problem/hematuria

## 2015-12-15 NOTE — Discharge Instructions (Signed)
Keep catheter in place. Follow-up with [facility] tomorrow. Call in the morning for an appointment.

## 2015-12-15 NOTE — ED Provider Notes (Signed)
CSN: [id number]     Arrival date & time 12/15/15  1246 History   First MD Initiated Contact with Patient 12/15/15 1313     Chief Complaint  Patient presents with  . Hematuria     (Consider location/radiation/quality/duration/timing/severity/associated sxs/prior Treatment) HPI....Marland KitchenMarland KitchenStatus post transurethral resection of a bladder tumor on 11/24/2015 by Dr. Karsten Ro.  Patient has been stable and self catheterizing with no problems. He is now unable to pass his urine. He feels full in his lower abdomen. No fever, sweats, chills. He is ambulatory and lives independently at home with his wife.  Past Medical History  Diagnosis Date  . Hyperlipidemia   . Hypertension   . GERD (gastroesophageal reflux disease)   . Esophageal stricture     a. History of dilitation - Long-standing history of esophageal reflux   . 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 1999: LIMA to LAD, sequential SVG to diagonal 2 and diagonal 3, sequential SVG to PDA, acute marginal branch, and PL branch. b. Repeat CABG 2015 with SVG to D1, SVG to PDA, and SVG to OM.   Marland Kitchen Chronic combined systolic and diastolic CHF (congestive heart failure) ([place])     a. Prior EF 45-50% in 08/2014. b. Improved EF >55% in 09/2014 at [facility].  . Type II diabetes mellitus ([place])   . Anemia     a. + FOBT 08/2014.  Marland Kitchen History of hiatal hernia   . Arthritis   . Chronic lower back pain   . CKD (chronic kidney disease) stage 3, GFR 30-59 ml/min   . Skin cancer   . Polymyalgia rheumatica ([place])   . SVT (supraventricular tachycardia) ([place])   . Atrial fibrillation ([place])   . Carotid artery occlusion     a. Duplex 12/2013: mild plaque bilat, 40-59% BICA. F/u due 12/2014.  Marland Kitchen Atherosclerosis of native arteries of the extremities with intermittent claudication     a. L external iliac stent 2001, bilateral SFA occlusions documented 2001.  Marland Kitchen OSA (obstructive sleep apnea)     "don't wear mask"   . Hypotonic bladder     Requiring in and out catheterization performed by the  patient at home - occasional blood tinged urine (self-cath's daily for the last 2 years) and hematuria is chronic for him, followed by urology.  . MI (myocardial infarction) ([place]) 1999-02/2014    total of 7 MI per patient  . COPD (chronic obstructive pulmonary disease) ([place])     pt states he doesn't have  . CAD (coronary artery disease), native coronary artery 09/24/2013    a. CABG x 6 1999. b. Interim PCIs: Angioplasty SVG-PDA 2006.Taxus DES to native LCx in 2007.DES to SVG-PDA 2007, restented 2009.NSTEMI with PCI to SVG- PDA complicated by no-reflow/inferior infarction in 2012. c. Redo CABG 2015 with SVG-PD, SVG-D1, SVG-OM1.  . History of gout   . Bladder cancer ([place])   . Self-catheterizes urinary bladder   . Anginal pain ([place])     comes and goes; uses nitro to relieve   . Dysrhythmia   . Shortness of breath dyspnea   . History of bronchitis   . Daily headache     comes and goes pt denies daily currently   . Gait instability    Past Surgical History  Procedure Laterality Date  . Coronary stent placement      "I've had 8 put in; I presently have none" (08/26/2014)  . Cardiac catheterization    . Intraoperative transesophageal echocardiogram N/A 03/01/2014    Procedure: INTRAOPERATIVE  TRANSESOPHAGEAL ECHOCARDIOGRAM;  Surgeon: Gaye Pollack, MD;  Location: [facility] OR;  Service: Open Heart Surgery;  Laterality: N/A;  . Anterior cervical decomp/discectomy fusion    . Ankle fracture surgery Right ~ 1977    "10 plates and 7 screws placed in it"  . Tonsillectomy    . Cataract extraction w/ intraocular lens  implant, bilateral Bilateral 2000's  . Mohs surgery  X 2    "nose; nose"  . Skin cancer excision      "all over my head; ears; back  . Esophagogastroduodenoscopy (egd) with esophageal dilation  X 1  . Coronary artery bypass graft  1999  . Coronary artery bypass graft N/A 03/01/2014    Procedure: REDO CORONARY ARTERY BYPASS GRAFTING TIMES THREE, USING RIGHT GREATER SAPHENOUS VEIN VIA  ENDOVEIN HARVEST.;  Surgeon: Gaye Pollack, MD;  Location: [facility] OR;  Service: Open Heart Surgery;  Laterality: N/A;  . Left heart catheterization with coronary/graft angiogram  02/26/2014    Procedure: LEFT HEART CATHETERIZATION WITH Beatrix Fetters;  Surgeon: Sinclair Grooms, MD;  Location: [facility] CATH LAB;  Service: Cardiovascular;;  . Left heart catheterization with coronary/graft angiogram N/A 07/23/2014    Procedure: LEFT HEART CATHETERIZATION WITH Beatrix Fetters;  Surgeon: Sinclair Grooms, MD;  Location: [facility] CATH LAB;  Service: Cardiovascular;  Laterality: N/A;  . Left heart catheterization with coronary/graft angiogram N/A 08/30/2014    Procedure: LEFT HEART CATHETERIZATION WITH Beatrix Fetters;  Surgeon: Leonie Man, MD;  Location: [facility] CATH LAB;  Service: Cardiovascular;  Laterality: N/A;  . Abdominal aortagram N/A 11/27/2014    Procedure: ABDOMINAL Maxcine Ham;  Surgeon: Wellington Hampshire, MD;  Location: [facility] CATH LAB;  Service: Cardiovascular;  Laterality: N/A;  . Multiple tooth extractions    . Colonoscopy    . Endarterectomy Left 01/30/2015    Procedure: ENDARTERECTOMY CAROTID WITH PRIMARY CLOSURE OF ARTERY;  Surgeon: Angelia Mould, MD;  Location: [facility];  Service: Vascular;  Laterality: Left;  . Back surgery      neck surgery x 2  . Fracture surgery Right     ankle  . Cystoscopy w/ retrogrades Bilateral 03/03/2015    Procedure: CYSTOSCOPY WITH LEFT RETROGRADE PYELOGRAM;  Surgeon: Kathie Rhodes, MD;  Location: [facility] ORS;  Service: Urology;  Laterality: Bilateral;  . Transurethral resection of bladder tumor with gyrus (turbt-gyrus) N/A 03/03/2015    Procedure: TRANSURETHRAL RESECTION OF BLADDER TUMOR;  Surgeon: Kathie Rhodes, MD;  Location: [facility] ORS;  Service: Urology;  Laterality: N/A;  . Transurethral resection of bladder tumor with gyrus (turbt-gyrus) N/A 04/28/2015    Procedure: TRANSURETHRAL RESECTION OF BLADDER TUMOR ;  Surgeon: Kathie Rhodes, MD;  Location: [facility]  ORS;  Service: Urology;  Laterality: N/A;  . Eye surgery      cataract surgery bilat   . Transurethral resection of bladder tumor with gyrus (turbt-gyrus) N/A 11/24/2015    Procedure: TRANSURETHRAL RESECTION OF BLADDER TUMOR WITH GYRUS (TURBT-GYRUS);  Surgeon: Kathie Rhodes, MD;  Location: [facility] ORS;  Service: Urology;  Laterality: N/A;   Family History  Problem Relation Age of Onset  . CAD Mother   . Heart disease Mother   . Hypertension Mother   . Cirrhosis Brother   . Heart disease Father   . Hyperlipidemia Father   . Hypertension Father   . Heart attack Father    Social History  Substance Use Topics  . Smoking status: Former Smoker -- 2.00 packs/day for 50 years    Types: Cigarettes  Quit date: 09/18/2015  . Smokeless tobacco: Never Used  . 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     Allergies  Other  Home Medications   Prior to Admission medications   Medication Sig Start Date End Date Taking? Authorizing Provider  acetaminophen (TYLENOL) 500 MG tablet Take 1,000 mg by mouth every 6 (six) hours as needed for moderate pain.   Yes Historical Provider, MD  amLODipine (NORVASC) 10 MG tablet Take 10 mg by mouth daily as needed (High BP).   Yes Historical Provider, MD  aspirin EC 81 MG EC tablet Take 1 tablet (81 mg total) by mouth daily. 11/19/14  Yes Wellington Hampshire, MD  atorvastatin (LIPITOR) 20 MG tablet Take 20 mg by mouth daily at 6 PM.    Yes Historical Provider, MD  clopidogrel (PLAVIX) 75 MG tablet Take 1 tablet (75 mg total) by mouth daily. 11/19/14  Yes Wellington Hampshire, MD  colchicine 0.6 MG tablet Take 1 tablet (0.6 mg total) by mouth 2 (two) times daily. 12/09/15  Yes Belva Crome, MD  Cranberry 500 MG CAPS Take 1 capsule by mouth at bedtime.   Yes Historical Provider, MD  ferrous sulfate 325 (65 FE) MG tablet Take 325 mg by mouth every evening.    Yes  Historical Provider, MD  GLIPIZIDE XL 10 MG 24 hr tablet Take 10 mg by mouth 2 (two) times daily. 12/09/15  Yes Historical Provider, MD  hydrALAZINE (APRESOLINE) 50 MG tablet Take 50 mg by mouth 3 (three) times daily as needed (Will only take when blood pressure is running high. Will at least take once daily). 11/21/15  Yes Belva Crome, MD  Insulin Glargine (LANTUS) 100 UNIT/ML Solostar Pen Inject 10 Units into the skin every morning. Patient taking differently: Inject 14 Units into the skin daily with breakfast.  09/03/14  Yes Delfina Redwood, MD  isosorbide mononitrate (IMDUR) 120 MG 24 hr tablet TAKE 1 TABLET BY MOUTH EVERY DAY 09/08/15  Yes Belva Crome, MD  metoprolol (LOPRESSOR) 50 MG tablet TAKE 1 TABLET BY MOUTH TWICE DAILY 12/09/15  Yes Wellington Hampshire, MD  Multiple Vitamin (MULTIVITAMIN WITH MINERALS) TABS tablet Take 1 tablet by mouth daily.   Yes Historical Provider, MD  nitroGLYCERIN (NITROSTAT) 0.4 MG SL tablet PLACE 1 TABLET UNDER THE TONGUE EVERY 5 MINUTES AS NEEDED FOR CHEST PAIN 07/25/15  Yes Belva Crome, MD  pantoprazole (PROTONIX) 40 MG tablet TAKE 1 TABLET BY MOUTH DAILY 12/04/15  Yes Wellington Hampshire, MD  torsemide (DEMADEX) 20 MG tablet Take 2 tablets by mouth once a day 10/29/15  Yes Belva Crome, MD   BP 146/65 mmHg  Pulse 107  Temp(Src) 97.7 F (36.5 C) (Oral)  Resp 18  SpO2 100% Physical Exam  Constitutional: He is oriented to person, place, and time. He appears well-developed and well-nourished.  HENT:  Head: Normocephalic and atraumatic.  Eyes: Conjunctivae and EOM are normal. Pupils are equal, round, and reactive to light.  Neck: Normal range of motion. Neck supple.  Cardiovascular: Normal rate and regular rhythm.   Pulmonary/Chest: Effort normal and breath sounds normal.  Abdominal: Soft. Bowel sounds are normal.  Slight suprapubic tenderness.  Genitourinary:  No flank tenderness  Musculoskeletal: Normal range of motion.  Neurological: He is alert and  oriented to person, place, and  time.  Skin: Skin is warm and dry.  Psychiatric: He has a normal mood and affect. His behavior is normal.  Nursing note and vitals reviewed.   ED Course  Procedures (including critical care time) Labs Review Labs Reviewed  BASIC METABOLIC PANEL - Abnormal; Notable for the following:    Glucose, Bld 148 (*)    BUN 53 (*)    Creatinine, Ser 2.18 (*)    GFR calc non Af Amer 28 (*)    GFR calc Af Amer 33 (*)    All other components within normal limits  CBC WITH DIFFERENTIAL/PLATELET - Abnormal; Notable for the following:    WBC 17.0 (*)    RBC 4.21 (*)    Hemoglobin 11.9 (*)    HCT 35.8 (*)    Neutro Abs 14.3 (*)    Monocytes Absolute 1.1 (*)    All other components within normal limits  URINALYSIS, ROUTINE W REFLEX MICROSCOPIC (NOT AT [facility])  TYPE AND SCREEN  ABO/RH    Imaging Review No results found. I have personally reviewed and evaluated these images and lab results as part of my medical decision-making.   EKG Interpretation None      MDM   Final diagnoses:  Hematuria  Malignant neoplasm of urinary bladder, unspecified site ([place])    Foley catheter inserted and 400 mL of moderately red colored urine drained immediately. Patient felt much better. Discussed clinical scenario with urologist on-call. We will leave the catheter in overnight and he will follow-up with [facility] in the morning.   Nat Christen, MD 12/15/15 [phone]

## 2015-12-16 DIAGNOSIS — R310 Gross hematuria: Secondary | ICD-10-CM | POA: Diagnosis not present

## 2015-12-17 ENCOUNTER — Inpatient Hospital Stay (HOSPITAL_BASED_OUTPATIENT_CLINIC_OR_DEPARTMENT_OTHER)
Admission: EM | Admit: 2015-12-17 | Discharge: 2015-12-20 | DRG: 281 | Disposition: A | Discharge status: Home / Self Care | Payer: Medicare Other | Attending: Internal Medicine | Admitting: Internal Medicine

## 2015-12-17 ENCOUNTER — Encounter (HOSPITAL_BASED_OUTPATIENT_CLINIC_OR_DEPARTMENT_OTHER): Payer: Self-pay

## 2015-12-17 DIAGNOSIS — T83091A Other mechanical complication of indwelling urethral catheter, initial encounter: Secondary | ICD-10-CM

## 2015-12-17 DIAGNOSIS — Z79899 Other long term (current) drug therapy: Secondary | ICD-10-CM | POA: Diagnosis not present

## 2015-12-17 DIAGNOSIS — I248 Other forms of acute ischemic heart disease: Secondary | ICD-10-CM | POA: Diagnosis not present

## 2015-12-17 DIAGNOSIS — I2510 Atherosclerotic heart disease of native coronary artery without angina pectoris: Secondary | ICD-10-CM | POA: Diagnosis present

## 2015-12-17 DIAGNOSIS — Z7982 Long term (current) use of aspirin: Secondary | ICD-10-CM

## 2015-12-17 DIAGNOSIS — Z91041 Radiographic dye allergy status: Secondary | ICD-10-CM | POA: Diagnosis not present

## 2015-12-17 DIAGNOSIS — D62 Acute posthemorrhagic anemia: Secondary | ICD-10-CM | POA: Diagnosis present

## 2015-12-17 DIAGNOSIS — I5042 Chronic combined systolic (congestive) and diastolic (congestive) heart failure: Secondary | ICD-10-CM | POA: Diagnosis present

## 2015-12-17 DIAGNOSIS — M109 Gout, unspecified: Secondary | ICD-10-CM | POA: Diagnosis present

## 2015-12-17 DIAGNOSIS — E785 Hyperlipidemia, unspecified: Secondary | ICD-10-CM | POA: Diagnosis present

## 2015-12-17 DIAGNOSIS — Z951 Presence of aortocoronary bypass graft: Secondary | ICD-10-CM | POA: Diagnosis not present

## 2015-12-17 DIAGNOSIS — M545 Low back pain: Secondary | ICD-10-CM | POA: Diagnosis present

## 2015-12-17 DIAGNOSIS — R7989 Other specified abnormal findings of blood chemistry: Secondary | ICD-10-CM

## 2015-12-17 DIAGNOSIS — J449 Chronic obstructive pulmonary disease, unspecified: Secondary | ICD-10-CM | POA: Diagnosis present

## 2015-12-17 DIAGNOSIS — G4733 Obstructive sleep apnea (adult) (pediatric): Secondary | ICD-10-CM | POA: Diagnosis present

## 2015-12-17 DIAGNOSIS — Z955 Presence of coronary angioplasty implant and graft: Secondary | ICD-10-CM | POA: Diagnosis not present

## 2015-12-17 DIAGNOSIS — R319 Hematuria, unspecified: Secondary | ICD-10-CM | POA: Diagnosis not present

## 2015-12-17 DIAGNOSIS — N312 Flaccid neuropathic bladder, not elsewhere classified: Secondary | ICD-10-CM | POA: Diagnosis present

## 2015-12-17 DIAGNOSIS — N183 Chronic kidney disease, stage 3 unspecified: Secondary | ICD-10-CM | POA: Diagnosis present

## 2015-12-17 DIAGNOSIS — C679 Malignant neoplasm of bladder, unspecified: Secondary | ICD-10-CM | POA: Diagnosis present

## 2015-12-17 DIAGNOSIS — M1990 Unspecified osteoarthritis, unspecified site: Secondary | ICD-10-CM | POA: Diagnosis present

## 2015-12-17 DIAGNOSIS — I209 Angina pectoris, unspecified: Secondary | ICD-10-CM | POA: Diagnosis not present

## 2015-12-17 DIAGNOSIS — I10 Essential (primary) hypertension: Secondary | ICD-10-CM | POA: Diagnosis not present

## 2015-12-17 DIAGNOSIS — N139 Obstructive and reflux uropathy, unspecified: Secondary | ICD-10-CM | POA: Diagnosis present

## 2015-12-17 DIAGNOSIS — I4891 Unspecified atrial fibrillation: Secondary | ICD-10-CM | POA: Diagnosis present

## 2015-12-17 DIAGNOSIS — Z7902 Long term (current) use of antithrombotics/antiplatelets: Secondary | ICD-10-CM

## 2015-12-17 DIAGNOSIS — Z87891 Personal history of nicotine dependence: Secondary | ICD-10-CM | POA: Diagnosis not present

## 2015-12-17 DIAGNOSIS — R0602 Shortness of breath: Secondary | ICD-10-CM | POA: Diagnosis not present

## 2015-12-17 DIAGNOSIS — I252 Old myocardial infarction: Secondary | ICD-10-CM | POA: Diagnosis not present

## 2015-12-17 DIAGNOSIS — E1122 Type 2 diabetes mellitus with diabetic chronic kidney disease: Secondary | ICD-10-CM | POA: Diagnosis present

## 2015-12-17 DIAGNOSIS — Z961 Presence of intraocular lens: Secondary | ICD-10-CM | POA: Diagnosis present

## 2015-12-17 DIAGNOSIS — Z9841 Cataract extraction status, right eye: Secondary | ICD-10-CM

## 2015-12-17 DIAGNOSIS — Z85828 Personal history of other malignant neoplasm of skin: Secondary | ICD-10-CM

## 2015-12-17 DIAGNOSIS — Z794 Long term (current) use of insulin: Secondary | ICD-10-CM | POA: Diagnosis not present

## 2015-12-17 DIAGNOSIS — C678 Malignant neoplasm of overlapping sites of bladder: Secondary | ICD-10-CM | POA: Diagnosis not present

## 2015-12-17 DIAGNOSIS — N3289 Other specified disorders of bladder: Secondary | ICD-10-CM

## 2015-12-17 DIAGNOSIS — N2581 Secondary hyperparathyroidism of renal origin: Secondary | ICD-10-CM | POA: Diagnosis not present

## 2015-12-17 DIAGNOSIS — N179 Acute kidney failure, unspecified: Secondary | ICD-10-CM | POA: Diagnosis present

## 2015-12-17 DIAGNOSIS — I739 Peripheral vascular disease, unspecified: Secondary | ICD-10-CM | POA: Diagnosis present

## 2015-12-17 DIAGNOSIS — E1159 Type 2 diabetes mellitus with other circulatory complications: Secondary | ICD-10-CM | POA: Diagnosis present

## 2015-12-17 DIAGNOSIS — I214 Non-ST elevation (NSTEMI) myocardial infarction: Secondary | ICD-10-CM | POA: Diagnosis not present

## 2015-12-17 DIAGNOSIS — T83098A Other mechanical complication of other indwelling urethral catheter, initial encounter: Secondary | ICD-10-CM | POA: Diagnosis not present

## 2015-12-17 DIAGNOSIS — R310 Gross hematuria: Secondary | ICD-10-CM | POA: Diagnosis present

## 2015-12-17 DIAGNOSIS — I4510 Unspecified right bundle-branch block: Secondary | ICD-10-CM | POA: Diagnosis present

## 2015-12-17 DIAGNOSIS — N189 Chronic kidney disease, unspecified: Secondary | ICD-10-CM | POA: Diagnosis not present

## 2015-12-17 DIAGNOSIS — M353 Polymyalgia rheumatica: Secondary | ICD-10-CM | POA: Diagnosis present

## 2015-12-17 DIAGNOSIS — Z9842 Cataract extraction status, left eye: Secondary | ICD-10-CM

## 2015-12-17 DIAGNOSIS — I255 Ischemic cardiomyopathy: Secondary | ICD-10-CM | POA: Diagnosis present

## 2015-12-17 DIAGNOSIS — Z8249 Family history of ischemic heart disease and other diseases of the circulatory system: Secondary | ICD-10-CM | POA: Diagnosis not present

## 2015-12-17 DIAGNOSIS — I5032 Chronic diastolic (congestive) heart failure: Secondary | ICD-10-CM | POA: Diagnosis not present

## 2015-12-17 DIAGNOSIS — R778 Other specified abnormalities of plasma proteins: Secondary | ICD-10-CM

## 2015-12-17 DIAGNOSIS — K219 Gastro-esophageal reflux disease without esophagitis: Secondary | ICD-10-CM | POA: Diagnosis present

## 2015-12-17 DIAGNOSIS — I21A1 Myocardial infarction type 2: Secondary | ICD-10-CM | POA: Diagnosis present

## 2015-12-17 DIAGNOSIS — G8929 Other chronic pain: Secondary | ICD-10-CM | POA: Diagnosis present

## 2015-12-17 DIAGNOSIS — I340 Nonrheumatic mitral (valve) insufficiency: Secondary | ICD-10-CM | POA: Diagnosis present

## 2015-12-17 DIAGNOSIS — I130 Hypertensive heart and chronic kidney disease with heart failure and stage 1 through stage 4 chronic kidney disease, or unspecified chronic kidney disease: Secondary | ICD-10-CM | POA: Diagnosis present

## 2015-12-17 DIAGNOSIS — R079 Chest pain, unspecified: Secondary | ICD-10-CM

## 2015-12-17 LAB — COMPREHENSIVE METABOLIC PANEL
ALBUMIN: 3.3 g/dL — AB (ref 3.5–5.0)
ALT: 25 U/L (ref 17–63)
AST: 27 U/L (ref 15–41)
Alkaline Phosphatase: 72 U/L (ref 38–126)
Anion gap: 11 (ref 5–15)
BILIRUBIN TOTAL: 0.4 mg/dL (ref 0.3–1.2)
BUN: 65 mg/dL — ABNORMAL HIGH (ref 6–20)
CO2: 23 mmol/L (ref 22–32)
Calcium: 8.4 mg/dL — ABNORMAL LOW (ref 8.9–10.3)
Chloride: 101 mmol/L (ref 101–111)
Creatinine, Ser: 2.68 mg/dL — ABNORMAL HIGH (ref 0.61–1.24)
GFR calc Af Amer: 25 mL/min — ABNORMAL LOW (ref >60)
GFR, EST NON AFRICAN AMERICAN: 22 mL/min — AB (ref >60)
GLUCOSE: 236 mg/dL — AB (ref 65–99)
Potassium: 4.2 mmol/L (ref 3.5–5.1)
Sodium: 135 mmol/L (ref 135–145)
TOTAL PROTEIN: 6.5 g/dL (ref 6.5–8.1)

## 2015-12-17 LAB — CBC WITH DIFFERENTIAL/PLATELET
BASOS ABS: 0.1 10*3/uL (ref 0.0–0.1)
Basophils Relative: 1 %
Eosinophils Absolute: 0.3 10*3/uL (ref 0.0–0.7)
Eosinophils Relative: 2 %
HEMATOCRIT: 23.1 % — AB (ref 39.0–52.0)
HEMOGLOBIN: 7.8 g/dL — AB (ref 13.0–17.0)
LYMPHS PCT: 21 %
Lymphs Abs: 2.3 10*3/uL (ref 0.7–4.0)
MCH: 28.6 pg (ref 26.0–34.0)
MCHC: 33.8 g/dL (ref 30.0–36.0)
MCV: 84.6 fL (ref 78.0–100.0)
Monocytes Absolute: 1.1 10*3/uL — ABNORMAL HIGH (ref 0.1–1.0)
Monocytes Relative: 10 %
NEUTROS ABS: 7.3 10*3/uL (ref 1.7–7.7)
NEUTROS PCT: 66 %
Platelets: 251 10*3/uL (ref 150–400)
RBC: 2.73 MIL/uL — AB (ref 4.22–5.81)
RDW: 13.3 % (ref 11.5–15.5)
WBC: 11 10*3/uL — AB (ref 4.0–10.5)

## 2015-12-17 LAB — TROPONIN I: Troponin I: 0.95 ng/mL (ref <0.031)

## 2015-12-17 LAB — CBG MONITORING, ED: Glucose-Capillary: 215 mg/dL — ABNORMAL HIGH (ref 65–99)

## 2015-12-17 MED ORDER — ASPIRIN 81 MG PO CHEW
324.0000 mg | CHEWABLE_TABLET | Freq: Once | ORAL | Status: AC
  Administered 2015-12-17: 324 mg via ORAL
  Filled 2015-12-17: qty 4

## 2015-12-17 NOTE — ED Notes (Signed)
Pt transported via [facility] to [facility], wife took belongings home and is aware of his transfer.

## 2015-12-17 NOTE — ED Notes (Signed)
Pt with bladder cancer and had surgery on Feb 6th, foley was placed.  Had blockage and went to urology and had irrigation performed with multiple clots.  Again today with blockage of foley.

## 2015-12-17 NOTE — ED Notes (Signed)
Pt's wife is going to go home, will call when pt leaves ED.  Home number, [phone], cell [phone]

## 2015-12-17 NOTE — ED Notes (Signed)
Pt originally came in for foley irrigation, but wife told MD that he was c/o chest pain last night.  Pt has a hx of 7 heart attacks and is currently not having chest pain.  Last night it was in the center of his chest and he felt like he was having indigestion.  Pt denies n/v, denies diaphoresis or deferred pain, states it was somewhat relieved by nitro tablets last night and then today he had so much bladder pain and distention that he did not notice any chest pain.

## 2015-12-17 NOTE — ED Provider Notes (Signed)
CSN: [id number]     Arrival date & time 12/17/15  2044 History  By signing my name below, I, Irene Pap, attest that this documentation has been prepared under the direction and in the presence of Gareth Morgan, MD. Electronically Signed: Irene Pap, ED Scribe. 12/17/2015. 9:50 PM.  Chief Complaint  Patient presents with  . Urinary Retention   The history is provided by the patient. No language interpreter was used.  HPI Comments: [name] is a 75 y.o. male with a hx of HTN, CABG, CHF, Type II DM, anemia, CKD, skin cancer, SVT, A-Fib, MI x7, hypotonic bladder, CAD, COPD, self-catheterizes, and anginal pain who presents to the Emergency Department complaining of urinary retention onset earlier today. Pt has bladder cancer and had surgery performed 11/24/15. Pt has been using a catheter by himself since the surgery. He reports bleeding that began on Monday after he started his Plavix again. He was seen at [facility] for similar symptoms. He was seen at urology for blockages of the foley cath and had an irrigation performed for 3 hours which removed blood clots. He states that he has not filled his foley bag all day and there is only scant blood. He reports associated suprapubic pain that began 2 hours ago, back pain, and hematuria. Severe, sharp, pressure. Pt reports chest pain that began at 1 AM (after his wife brought it up) and relieved after taking a few nitroglycerins, NO current chest pain or dyspnea. He states that it feels similar to his anginal pain. He is due to see his kidney specialist last week. He quit taking his Plavix on Monday when the symptoms began. He denies fever, chills, nausea, or vomiting.   Past Medical History  Diagnosis Date  . Hyperlipidemia   . Hypertension   . GERD (gastroesophageal reflux disease)   . Esophageal stricture     a. History of dilitation - Long-standing history of esophageal reflux   . 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 1999: LIMA to LAD,  sequential SVG to diagonal 2 and diagonal 3, sequential SVG to PDA, acute marginal branch, and PL branch. b. Repeat CABG 2015 with SVG to D1, SVG to PDA, and SVG to OM.   Marland Kitchen Chronic combined systolic and diastolic CHF (congestive heart failure) ([place])     a. Prior EF 45-50% in 08/2014. b. Improved EF >55% in 09/2014 at [facility].  . Type II diabetes mellitus ([place])   . Anemia     a. + FOBT 08/2014.  Marland Kitchen History of hiatal hernia   . Arthritis   . Chronic lower back pain   . CKD (chronic kidney disease) stage 3, GFR 30-59 ml/min   . Skin cancer   . Polymyalgia rheumatica ([place])   . SVT (supraventricular tachycardia) ([place])   . Atrial fibrillation ([place])   . Carotid artery occlusion     a. Duplex 12/2013: mild plaque bilat, 40-59% BICA. F/u due 12/2014.  Marland Kitchen Atherosclerosis of native arteries of the extremities with intermittent claudication     a. L external iliac stent 2001, bilateral SFA occlusions documented 2001.  Marland Kitchen OSA (obstructive sleep apnea)     "don't wear mask"   . Hypotonic bladder     Requiring in and out catheterization performed by the patient at home - occasional blood tinged urine (self-cath's daily for the last 2 years) and hematuria is chronic for him, followed by urology.  . MI (myocardial infarction) ([place]) 1999-02/2014    total of 7 MI  per patient  . COPD (chronic obstructive pulmonary disease) ([place])     pt states he doesn't have  . CAD (coronary artery disease), native coronary artery 09/24/2013    a. CABG x 6 1999. b. Interim PCIs: Angioplasty SVG-PDA 2006.Taxus DES to native LCx in 2007.DES to SVG-PDA 2007, restented 2009.NSTEMI with PCI to SVG- PDA complicated by no-reflow/inferior infarction in 2012. c. Redo CABG 2015 with SVG-PD, SVG-D1, SVG-OM1.  . History of gout   . Bladder cancer ([place])   . Self-catheterizes urinary bladder   . Anginal pain ([place])     comes and goes; uses nitro to relieve   . Dysrhythmia   . Shortness of breath dyspnea   . History of bronchitis   . Daily  headache     comes and goes pt denies daily currently   . Gait instability    Past Surgical History  Procedure Laterality Date  . Coronary stent placement      "I've had 8 put in; I presently have none" (08/26/2014)  . Cardiac catheterization    . Intraoperative transesophageal echocardiogram N/A 03/01/2014    Procedure: INTRAOPERATIVE TRANSESOPHAGEAL ECHOCARDIOGRAM;  Surgeon: Gaye Pollack, MD;  Location: [facility] OR;  Service: Open Heart Surgery;  Laterality: N/A;  . Anterior cervical decomp/discectomy fusion    . Ankle fracture surgery Right ~ 1977    "10 plates and 7 screws placed in it"  . Tonsillectomy    . Cataract extraction w/ intraocular lens  implant, bilateral Bilateral 2000's  . Mohs surgery  X 2    "nose; nose"  . Skin cancer excision      "all over my head; ears; back  . Esophagogastroduodenoscopy (egd) with esophageal dilation  X 1  . Coronary artery bypass graft  1999  . Coronary artery bypass graft N/A 03/01/2014    Procedure: REDO CORONARY ARTERY BYPASS GRAFTING TIMES THREE, USING RIGHT GREATER SAPHENOUS VEIN VIA ENDOVEIN HARVEST.;  Surgeon: Gaye Pollack, MD;  Location: [facility] OR;  Service: Open Heart Surgery;  Laterality: N/A;  . Left heart catheterization with coronary/graft angiogram  02/26/2014    Procedure: LEFT HEART CATHETERIZATION WITH Beatrix Fetters;  Surgeon: Sinclair Grooms, MD;  Location: [facility] CATH LAB;  Service: Cardiovascular;;  . Left heart catheterization with coronary/graft angiogram N/A 07/23/2014    Procedure: LEFT HEART CATHETERIZATION WITH Beatrix Fetters;  Surgeon: Sinclair Grooms, MD;  Location: [facility] CATH LAB;  Service: Cardiovascular;  Laterality: N/A;  . Left heart catheterization with coronary/graft angiogram N/A 08/30/2014    Procedure: LEFT HEART CATHETERIZATION WITH Beatrix Fetters;  Surgeon: Leonie Man, MD;  Location: [facility] CATH LAB;  Service: Cardiovascular;  Laterality: N/A;  . Abdominal aortagram N/A 11/27/2014     Procedure: ABDOMINAL Maxcine Ham;  Surgeon: Wellington Hampshire, MD;  Location: [place] CATH LAB;  Service: Cardiovascular;  Laterality: N/A;  . Multiple tooth extractions    . Colonoscopy    . Endarterectomy Left 01/30/2015    Procedure: ENDARTERECTOMY CAROTID WITH PRIMARY CLOSURE OF ARTERY;  Surgeon: Angelia Mould, MD;  Location: [place];  Service: Vascular;  Laterality: Left;  . Back surgery      neck surgery x 2  . Fracture surgery Right     ankle  . Cystoscopy w/ retrogrades Bilateral 03/03/2015    Procedure: CYSTOSCOPY WITH LEFT RETROGRADE PYELOGRAM;  Surgeon: Kathie Rhodes, MD;  Location: [facility] ORS;  Service: Urology;  Laterality: Bilateral;  . Transurethral resection of bladder tumor with gyrus (turbt-gyrus) N/A 03/03/2015  Procedure: TRANSURETHRAL RESECTION OF BLADDER TUMOR;  Surgeon: Kathie Rhodes, MD;  Location: [facility] ORS;  Service: Urology;  Laterality: N/A;  . Transurethral resection of bladder tumor with gyrus (turbt-gyrus) N/A 04/28/2015    Procedure: TRANSURETHRAL RESECTION OF BLADDER TUMOR ;  Surgeon: Kathie Rhodes, MD;  Location: [facility] ORS;  Service: Urology;  Laterality: N/A;  . Eye surgery      cataract surgery bilat   . Transurethral resection of bladder tumor with gyrus (turbt-gyrus) N/A 11/24/2015    Procedure: TRANSURETHRAL RESECTION OF BLADDER TUMOR WITH GYRUS (TURBT-GYRUS);  Surgeon: Kathie Rhodes, MD;  Location: [facility] ORS;  Service: Urology;  Laterality: N/A;   Family History  Problem Relation Age of Onset  . CAD Mother   . Heart disease Mother   . Hypertension Mother   . Cirrhosis Brother   . Heart disease Father   . Hyperlipidemia Father   . Hypertension Father   . Heart attack Father    Social History  Substance Use Topics  . Smoking status: Former Smoker -- 2.00 packs/day for 50 years    Types: Cigarettes    Quit date: 09/18/2015  . Smokeless tobacco: Never Used  . 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  HENT: Negative for sore throat.   Eyes: Negative for visual disturbance.  Respiratory: Negative for shortness of breath.   Cardiovascular: Positive for chest pain.  Gastrointestinal: Positive for abdominal pain. Negative for nausea and vomiting.  Genitourinary: Positive for hematuria, decreased urine volume and difficulty urinating.  Musculoskeletal: Negative for back pain and neck stiffness.  Skin: Negative for rash.  Neurological: Negative for syncope and headaches.   Allergies  Other  Home Medications   Prior to Admission medications   Medication Sig Start Date End Date Taking? Authorizing Provider  acetaminophen (TYLENOL) 500 MG tablet Take 1,000 mg by mouth every 6 (six) hours as needed for moderate pain.    Historical Provider, MD  amLODipine (NORVASC) 10 MG tablet Take 10 mg by mouth daily as needed (High BP).    Historical Provider, MD  aspirin EC 81 MG EC tablet Take 1 tablet (81 mg total) by mouth daily. 11/19/14   Wellington Hampshire, MD  atorvastatin (LIPITOR) 20 MG tablet Take 20 mg by mouth daily at 6 PM.     Historical Provider, MD  clopidogrel (PLAVIX) 75 MG tablet Take 1 tablet (75 mg total) by mouth daily. 11/19/14   Wellington Hampshire, MD  colchicine 0.6 MG tablet Take 1 tablet (0.6 mg total) by mouth 2 (two) times daily. 12/09/15   Belva Crome, MD  Cranberry 500 MG CAPS Take 1 capsule by mouth at bedtime.    Historical Provider, MD  ferrous sulfate 325 (65 FE) MG tablet Take 325 mg by mouth every evening.     Historical Provider, MD  GLIPIZIDE XL 10 MG 24 hr tablet Take 10 mg by mouth 2 (two) times daily. 12/09/15   Historical Provider, MD  hydrALAZINE (APRESOLINE) 50 MG tablet Take 50 mg by mouth 3 (three) times daily as needed (Will only take when blood pressure is running high. Will at least take once daily). 11/21/15   Belva Crome, MD  Insulin Glargine (LANTUS) 100 UNIT/ML Solostar Pen Inject 10 Units into the skin  every morning. Patient taking differently: Inject 14 Units into the skin daily with  breakfast.  09/03/14   Delfina Redwood, MD  isosorbide mononitrate (IMDUR) 120 MG 24 hr tablet TAKE 1 TABLET BY MOUTH EVERY DAY 09/08/15   Belva Crome, MD  metoprolol (LOPRESSOR) 50 MG tablet TAKE 1 TABLET BY MOUTH TWICE DAILY 12/09/15   Wellington Hampshire, MD  Multiple Vitamin (MULTIVITAMIN WITH MINERALS) TABS tablet Take 1 tablet by mouth daily.    Historical Provider, MD  nitroGLYCERIN (NITROSTAT) 0.4 MG SL tablet PLACE 1 TABLET UNDER THE TONGUE EVERY 5 MINUTES AS NEEDED FOR CHEST PAIN 07/25/15   Belva Crome, MD  pantoprazole (PROTONIX) 40 MG tablet TAKE 1 TABLET BY MOUTH DAILY 12/04/15   Wellington Hampshire, MD  torsemide (DEMADEX) 20 MG tablet Take 2 tablets by mouth once a day 10/29/15   Belva Crome, MD   BP 134/48 mmHg  Pulse 59  Temp(Src) 98.2 F (36.8 C) (Oral)  Resp 19  Ht 5\' 6"  (1.676 m)  Wt 157 lb 13.6 oz (71.6 kg)  BMI 25.49 kg/m2  SpO2 97% Physical Exam  Constitutional: He is oriented to person, place, and time. He appears well-developed and well-nourished.  HENT:  Head: Normocephalic and atraumatic.  Eyes: EOM are normal. Pupils are equal, round, and reactive to light.  Neck: Normal range of motion. Neck supple.  Cardiovascular: Normal rate, regular rhythm and normal heart sounds.  Exam reveals no gallop and no friction rub.   No murmur heard. Pulmonary/Chest: Effort normal and breath sounds normal. He has no wheezes.  Abdominal: Soft. There is tenderness in the suprapubic area. There is guarding.  Genitourinary:  Leg bag without urine and with blood  Musculoskeletal: Normal range of motion.  Neurological: He is alert and oriented to person, place, and time.  Skin: Skin is warm and dry.  Psychiatric: He has a normal mood and affect. His behavior is normal.  Nursing note and vitals reviewed.   ED Course  Procedures (including critical care time) DIAGNOSTIC STUDIES: Oxygen  Saturation is 98% on RA, normal by my interpretation.    COORDINATION OF CARE: 9:03 PM-Discussed treatment plan which includes labs and EKG with pt at bedside and pt agreed to plan.   Labs Review Labs Reviewed  CBC WITH DIFFERENTIAL/PLATELET - Abnormal; Notable for the following:    WBC 11.0 (*)    RBC 2.73 (*)    Hemoglobin 7.8 (*)    HCT 23.1 (*)    Monocytes Absolute 1.1 (*)    All other components within normal limits  COMPREHENSIVE METABOLIC PANEL - Abnormal; Notable for the following:    Glucose, Bld 236 (*)    BUN 65 (*)    Creatinine, Ser 2.68 (*)    Calcium 8.4 (*)    Albumin 3.3 (*)    GFR calc non Af Amer 22 (*)    GFR calc Af Amer 25 (*)    All other components within normal limits  TROPONIN I - Abnormal; Notable for the following:    Troponin I 0.95 (*)    All other components within normal limits  CBG MONITORING, ED - Abnormal; Notable for the following:    Glucose-Capillary 215 (*)    All other components within normal limits  MRSA PCR SCREENING    Imaging Review No results found. I have personally reviewed and evaluated these images and lab results as part of my medical decision-making.   EKG Interpretation   Date/Time:  Wednesday December 17 2015 21:07:39 EST Ventricular Rate:  77 PR Interval:  179  QRS Duration: 138 QT Interval:  425 QTC Calculation: 481 R Axis:   -65 Text Interpretation:  Sinus rhythm Atrial premature complexes Right bundle  branch block More pronounced ST elevation in aVR and depressions in  inferior and lateral leads than prior Confirmed by [facility] MD, [place]  ([id number]) on 12/18/2015 1:25:10 AM      CRITICAL CARE: acute hemorrhagic anemia with elevation in troponin Performed by: Alvino Chapel   Total critical care time:30 minutes  Critical care time was exclusive of separately billable procedures and treating other patients.  Critical care was necessary to treat or prevent imminent or life-threatening  deterioration.  Critical care was time spent personally by me on the following activities: development of treatment plan with patient and/or surrogate as well as nursing, discussions with consultants, evaluation of patient's response to treatment, examination of patient, obtaining history from patient or surrogate, ordering and performing treatments and interventions, ordering and review of laboratory studies, ordering and review of radiographic studies, pulse oximetry and re-evaluation of patient's condition.  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 Patient also reports he had an episode of chest pain last night which was relieved by nitroglycerin.  Labs are significant for hemoglobin of 7.8, down from 11 on February 27. Foley catheter was irrigated with return of blood, with urine not clearing on irrigation.  Given patient's episode of chest pain last night, EKG and troponin were ordered which showed ischemic changes increased from prior, with elevation in aVR and diffuse depression. Discussed with cardiology on call who agreed without continuing chest pain, and  in setting of anemia, symptoms are unlikely to represent acute STEMI.  Troponin elevated to .9.  Given significant drop in hgb, with likely continuing bleeding in pt with history of significant CAD,  elevation in troponin, and EKG changes are likely secondary to demand ischemia and need for cardiac cath unlikely. Patient was given aspirin, however will not order other anticoagulation given ongoing bleeding.   we are unable to provide type/matched blood transfusion here at [facility] [place], and given hemodynGiven ongoing bleeding from patient's Foley catheter, discussed with urology on-call, Dr. Alinda Money, who will evaluate the patient when he arrives today [facility].  Patient to be admitted to the stepdown unit for concern of hemorrhagic anemia from bleeding from bladder, with elevation in troponin likely secondary to demand ischemia in setting of coronary artery disease.  I personally performed the services described in this documentation, which was scribed in my presence. The recorded information has been reviewed and is accurate.     Gareth Morgan, MD 12/18/15 CD:[id number]

## 2015-12-18 ENCOUNTER — Inpatient Hospital Stay (HOSPITAL_COMMUNITY): Payer: Medicare Other

## 2015-12-18 ENCOUNTER — Encounter (HOSPITAL_COMMUNITY): Payer: Self-pay | Admitting: Internal Medicine

## 2015-12-18 DIAGNOSIS — R079 Chest pain, unspecified: Secondary | ICD-10-CM

## 2015-12-18 DIAGNOSIS — D62 Acute posthemorrhagic anemia: Secondary | ICD-10-CM

## 2015-12-18 DIAGNOSIS — I248 Other forms of acute ischemic heart disease: Secondary | ICD-10-CM

## 2015-12-18 DIAGNOSIS — I5032 Chronic diastolic (congestive) heart failure: Secondary | ICD-10-CM

## 2015-12-18 DIAGNOSIS — I214 Non-ST elevation (NSTEMI) myocardial infarction: Principal | ICD-10-CM

## 2015-12-18 DIAGNOSIS — N179 Acute kidney failure, unspecified: Secondary | ICD-10-CM | POA: Diagnosis present

## 2015-12-18 DIAGNOSIS — R319 Hematuria, unspecified: Secondary | ICD-10-CM | POA: Diagnosis present

## 2015-12-18 LAB — TROPONIN I
TROPONIN I: 0.75 ng/mL — AB (ref <0.031)
TROPONIN I: 0.48 ng/mL — AB (ref <0.031)
Troponin I: 0.56 ng/mL (ref <0.031)

## 2015-12-18 LAB — BASIC METABOLIC PANEL
ANION GAP: 9 (ref 5–15)
BUN: 69 mg/dL — ABNORMAL HIGH (ref 6–20)
CALCIUM: 8.4 mg/dL — AB (ref 8.9–10.3)
CO2: 24 mmol/L (ref 22–32)
CREATININE: 2.62 mg/dL — AB (ref 0.61–1.24)
Chloride: 104 mmol/L (ref 101–111)
GFR, EST AFRICAN AMERICAN: 26 mL/min — AB (ref >60)
GFR, EST NON AFRICAN AMERICAN: 22 mL/min — AB (ref >60)
Glucose, Bld: 227 mg/dL — ABNORMAL HIGH (ref 65–99)
Potassium: 4.4 mmol/L (ref 3.5–5.1)
Sodium: 137 mmol/L (ref 135–145)

## 2015-12-18 LAB — TYPE AND SCREEN
ABO/RH(D): O POS
Antibody Screen: NEGATIVE

## 2015-12-18 LAB — GLUCOSE, CAPILLARY
GLUCOSE-CAPILLARY: 145 mg/dL — AB (ref 65–99)
GLUCOSE-CAPILLARY: 167 mg/dL — AB (ref 65–99)
Glucose-Capillary: 161 mg/dL — ABNORMAL HIGH (ref 65–99)

## 2015-12-18 LAB — CBC
HCT: 21.4 % — ABNORMAL LOW (ref 39.0–52.0)
HEMOGLOBIN: 7.1 g/dL — AB (ref 13.0–17.0)
MCH: 28.5 pg (ref 26.0–34.0)
MCHC: 33.2 g/dL (ref 30.0–36.0)
MCV: 85.9 fL (ref 78.0–100.0)
PLATELETS: 210 10*3/uL (ref 150–400)
RBC: 2.49 MIL/uL — AB (ref 4.22–5.81)
RDW: 13.7 % (ref 11.5–15.5)
WBC: 11.4 10*3/uL — ABNORMAL HIGH (ref 4.0–10.5)

## 2015-12-18 LAB — PREPARE RBC (CROSSMATCH)

## 2015-12-18 LAB — MRSA PCR SCREENING: MRSA by PCR: NEGATIVE

## 2015-12-18 MED ORDER — COLCHICINE 0.6 MG PO TABS
0.6000 mg | ORAL_TABLET | Freq: Every day | ORAL | Status: DC
  Administered 2015-12-18 – 2015-12-20 (×3): 0.6 mg via ORAL
  Filled 2015-12-18 (×3): qty 1

## 2015-12-18 MED ORDER — INSULIN ASPART 100 UNIT/ML ~~LOC~~ SOLN: 0.0000 [IU] | Freq: Three times a day (TID) | SUBCUTANEOUS | Status: DC

## 2015-12-18 MED ORDER — ISOSORBIDE MONONITRATE ER 30 MG PO TB24
120.0000 mg | ORAL_TABLET | Freq: Every day | ORAL | Status: DC
  Administered 2015-12-18 – 2015-12-20 (×3): 120 mg via ORAL
  Filled 2015-12-18: qty 2
  Filled 2015-12-18: qty 4
  Filled 2015-12-18: qty 2

## 2015-12-18 MED ORDER — PANTOPRAZOLE SODIUM 40 MG PO TBEC
40.0000 mg | DELAYED_RELEASE_TABLET | Freq: Every day | ORAL | Status: DC
  Administered 2015-12-18 – 2015-12-20 (×3): 40 mg via ORAL
  Filled 2015-12-18 (×3): qty 1

## 2015-12-18 MED ORDER — AMLODIPINE BESYLATE 10 MG PO TABS: 10.0000 mg | ORAL_TABLET | Freq: Every day | ORAL | Status: DC | PRN

## 2015-12-18 MED ORDER — ONDANSETRON HCL 4 MG/2ML IJ SOLN: 4.0000 mg | Freq: Four times a day (QID) | INTRAMUSCULAR | Status: DC | PRN

## 2015-12-18 MED ORDER — METOPROLOL TARTRATE 50 MG PO TABS
50.0000 mg | ORAL_TABLET | Freq: Two times a day (BID) | ORAL | Status: DC
  Administered 2015-12-18 – 2015-12-20 (×5): 50 mg via ORAL
  Filled 2015-12-18: qty 1
  Filled 2015-12-18: qty 2
  Filled 2015-12-18: qty 1
  Filled 2015-12-18 (×2): qty 2

## 2015-12-18 MED ORDER — ACETAMINOPHEN 650 MG RE SUPP: 650.0000 mg | Freq: Four times a day (QID) | RECTAL | Status: DC | PRN

## 2015-12-18 MED ORDER — FERROUS SULFATE 325 (65 FE) MG PO TABS
325.0000 mg | ORAL_TABLET | Freq: Every evening | ORAL | Status: DC
  Administered 2015-12-18 – 2015-12-19 (×2): 325 mg via ORAL
  Filled 2015-12-18 (×2): qty 1

## 2015-12-18 MED ORDER — DEXTROSE 5 % IV SOLN
1.0000 g | Freq: Every day | INTRAVENOUS | Status: DC
  Administered 2015-12-18 – 2015-12-19 (×3): 1 g via INTRAVENOUS
  Filled 2015-12-18 (×3): qty 10

## 2015-12-18 MED ORDER — ADULT MULTIVITAMIN W/MINERALS CH
1.0000 | ORAL_TABLET | Freq: Every day | ORAL | Status: DC
  Administered 2015-12-18 – 2015-12-20 (×3): 1 via ORAL
  Filled 2015-12-18 (×3): qty 1

## 2015-12-18 MED ORDER — TORSEMIDE 20 MG PO TABS
40.0000 mg | ORAL_TABLET | Freq: Every day | ORAL | Status: DC
  Administered 2015-12-18 – 2015-12-20 (×3): 40 mg via ORAL
  Filled 2015-12-18 (×3): qty 2

## 2015-12-18 MED ORDER — GLIPIZIDE ER 10 MG PO TB24
10.0000 mg | ORAL_TABLET | Freq: Two times a day (BID) | ORAL | Status: DC
  Administered 2015-12-18 – 2015-12-20 (×5): 10 mg via ORAL
  Filled 2015-12-18 (×12): qty 1

## 2015-12-18 MED ORDER — FUROSEMIDE 10 MG/ML IJ SOLN
20.0000 mg | Freq: Once | INTRAMUSCULAR | Status: AC
  Administered 2015-12-18 (×2): 20 mg via INTRAVENOUS

## 2015-12-18 MED ORDER — INSULIN ASPART 100 UNIT/ML ~~LOC~~ SOLN: 0.0000 [IU] | Freq: Every day | SUBCUTANEOUS | Status: DC

## 2015-12-18 MED ORDER — SODIUM CHLORIDE 0.9 % IV SOLN: Freq: Once | INTRAVENOUS | Status: DC

## 2015-12-18 MED ORDER — FUROSEMIDE 10 MG/ML IJ SOLN
INTRAMUSCULAR | Status: AC
  Administered 2015-12-18: 20 mg via INTRAVENOUS
  Filled 2015-12-18: qty 2

## 2015-12-18 MED ORDER — INSULIN GLARGINE 100 UNIT/ML ~~LOC~~ SOLN
14.0000 [IU] | Freq: Every day | SUBCUTANEOUS | Status: DC
  Administered 2015-12-18 – 2015-12-20 (×3): 14 [IU] via SUBCUTANEOUS
  Filled 2015-12-18 (×4): qty 0.14

## 2015-12-18 MED ORDER — SODIUM CHLORIDE 0.9% FLUSH
3.0000 mL | Freq: Two times a day (BID) | INTRAVENOUS | Status: DC
  Administered 2015-12-18 – 2015-12-19 (×2): 3 mL via INTRAVENOUS

## 2015-12-18 MED ORDER — ONDANSETRON HCL 4 MG PO TABS: 4.0000 mg | ORAL_TABLET | Freq: Four times a day (QID) | ORAL | Status: DC | PRN

## 2015-12-18 MED ORDER — ACETAMINOPHEN 325 MG PO TABS: 650.0000 mg | ORAL_TABLET | Freq: Four times a day (QID) | ORAL | Status: DC | PRN

## 2015-12-18 MED ORDER — HYDRALAZINE HCL 20 MG/ML IJ SOLN
10.0000 mg | INTRAMUSCULAR | Status: DC | PRN
  Administered 2015-12-18 – 2015-12-19 (×2): 10 mg via INTRAVENOUS
  Filled 2015-12-18 (×2): qty 1

## 2015-12-18 MED ORDER — NITROGLYCERIN 0.4 MG SL SUBL: 0.4000 mg | SUBLINGUAL_TABLET | SUBLINGUAL | Status: DC | PRN

## 2015-12-18 MED ORDER — INSULIN ASPART 100 UNIT/ML ~~LOC~~ SOLN
0.0000 [IU] | Freq: Three times a day (TID) | SUBCUTANEOUS | Status: DC
  Administered 2015-12-18: 3 [IU] via SUBCUTANEOUS
  Administered 2015-12-18: 2 [IU] via SUBCUTANEOUS
  Administered 2015-12-18 – 2015-12-19 (×3): 3 [IU] via SUBCUTANEOUS

## 2015-12-18 MED ORDER — ATORVASTATIN CALCIUM 10 MG PO TABS
20.0000 mg | ORAL_TABLET | Freq: Every day | ORAL | Status: DC
  Administered 2015-12-18 – 2015-12-19 (×2): 20 mg via ORAL
  Filled 2015-12-18 (×2): qty 2

## 2015-12-18 NOTE — H&P (Addendum)
[facility] History and Physical  Tilton Cata Tango TZ:[id number] DOB: 1941-08-15 DOA: 12/17/2015  Referring physician: Patient was transferred from [facility] [place]. PCP: Shirline Frees, MD  Specialists: Dr. Tamala Julian. Cardiologist.  Chief Complaint: Urinary obstruction.  HPI: NTHONY Potter is a 75 y.o. male with history of bladder cancer who had transurethral resection of the bladder tumor on 11/24/2015 and was off Plavix and was restarted last Friday 6 days ago following which patient started developing hematuria and had gone to ER 2 days ago because of obstruction and patient had Foley catheter placed and was advised to follow with urologist. Patient returns back to the ER yesterday because of worsening hematuria and urinary obstruction. Patient also had transient chest pain night before this which lasted for about half an hour with pressure-like which improved with nitroglycerin. In the ER patient's troponin is found to be mildly elevated with EKG showing diffuse ST depression more pronounced than before. Patient is currently chest pain-free. Hemoglobin has dropped by 4 g from recent. Patient has been admitted for further management of non-ST elevation MI and urinary obstruction with hematuria. Patient has stopped taking Plavix 3 days ago but still is on aspirin.   Review of Systems: As presented in the history of presenting illness, rest negative.  Past Medical History  Diagnosis Date  . Hyperlipidemia   . Hypertension   . GERD (gastroesophageal reflux disease)   . Esophageal stricture     a. History of dilitation - Long-standing history of esophageal reflux   . 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 1999: LIMA to LAD, sequential SVG to diagonal 2 and diagonal 3, sequential SVG to PDA, acute marginal branch, and PL branch. b. Repeat CABG 2015 with SVG to D1, SVG to PDA, and SVG to OM.   Marland Kitchen Chronic combined systolic and diastolic CHF (congestive heart failure) ([place])     a. Prior EF 45-50%  in 08/2014. b. Improved EF >55% in 09/2014 at [facility].  . Type II diabetes mellitus ([place])   . Anemia     a. + FOBT 08/2014.  Marland Kitchen History of hiatal hernia   . Arthritis   . Chronic lower back pain   . CKD (chronic kidney disease) stage 3, GFR 30-59 ml/min   . Skin cancer   . Polymyalgia rheumatica ([place])   . SVT (supraventricular tachycardia) ([place])   . Atrial fibrillation ([place])   . Carotid artery occlusion     a. Duplex 12/2013: mild plaque bilat, 40-59% BICA. F/u due 12/2014.  Marland Kitchen Atherosclerosis of native arteries of the extremities with intermittent claudication     a. L external iliac stent 2001, bilateral SFA occlusions documented 2001.  Marland Kitchen OSA (obstructive sleep apnea)     "don't wear mask"   . Hypotonic bladder     Requiring in and out catheterization performed by the patient at home - occasional blood tinged urine (self-cath's daily for the last 2 years) and hematuria is chronic for him, followed by urology.  . MI (myocardial infarction) ([place]) 1999-02/2014    total of 7 MI per patient  . COPD (chronic obstructive pulmonary disease) ([place])     pt states he doesn't have  . CAD (coronary artery disease), native coronary artery 09/24/2013    a. CABG x 6 1999. b. Interim PCIs: Angioplasty SVG-PDA 2006.Taxus DES to native LCx in 2007.DES to SVG-PDA 2007, restented 2009.NSTEMI with PCI to SVG- PDA complicated by no-reflow/inferior infarction in 2012. c. Redo CABG 2015 with SVG-PD, SVG-D1,  SVG-OM1.  . History of gout   . Bladder cancer ([place])   . Self-catheterizes urinary bladder   . Anginal pain ([place])     comes and goes; uses nitro to relieve   . Dysrhythmia   . Shortness of breath dyspnea   . History of bronchitis   . Daily headache     comes and goes pt denies daily currently   . Gait instability    Past Surgical History  Procedure Laterality Date  . Coronary stent placement      "I've had 8 put in; I presently have none" (08/26/2014)  . Cardiac catheterization    . Intraoperative  transesophageal echocardiogram N/A 03/01/2014    Procedure: INTRAOPERATIVE TRANSESOPHAGEAL ECHOCARDIOGRAM;  Surgeon: Gaye Pollack, MD;  Location[facility];  Service: Open Heart Surgery;  Laterality: N/A;  . Anterior cervical decomp/discectomy fusion    . Ankle fracture surgery Right ~ 1977    "10 plates and 7 screws placed in it"  . Tonsillectomy    . Cataract extraction w/ intraocular lens  implant, bilateral Bilateral 2000's  . Mohs surgery  X 2    "nose; nose"  . Skin cancer excision      "all over my head; ears; back  . Esophagogastroduodenoscopy (egd) with esophageal dilation  X 1  . Coronary artery bypass graft  1999  . Coronary artery bypass graft N/A 03/01/2014    Procedure: REDO CORONARY ARTERY BYPASS GRAFTING TIMES THREE, USING RIGHT GREATER SAPHENOUS VEIN VIA ENDOVEIN HARVEST.;  Surgeon: Gaye Pollack, MD;  Location: [facility] OR;  Service: Open Heart Surgery;  Laterality: N/A;  . Left heart catheterization with coronary/graft angiogram  02/26/2014    Procedure: LEFT HEART CATHETERIZATION WITH Beatrix Fetters;  Surgeon: Sinclair Grooms, MD;  Location: [facility] CATH LAB;  Service: Cardiovascular;;  . Left heart catheterization with coronary/graft angiogram N/A 07/23/2014    Procedure: LEFT HEART CATHETERIZATION WITH Beatrix Fetters;  Surgeon: Sinclair Grooms, MD;  Location: [facility] CATH LAB;  Service: Cardiovascular;  Laterality: N/A;  . Left heart catheterization with coronary/graft angiogram N/A 08/30/2014    Procedure: LEFT HEART CATHETERIZATION WITH Beatrix Fetters;  Surgeon: Leonie Man, MD;  Location: [facility] CATH LAB;  Service: Cardiovascular;  Laterality: N/A;  . Abdominal aortagram N/A 11/27/2014    Procedure: ABDOMINAL Maxcine Ham;  Surgeon: Wellington Hampshire, MD;  Location: [facility] CATH LAB;  Service: Cardiovascular;  Laterality: N/A;  . Multiple tooth extractions    . Colonoscopy    . Endarterectomy Left 01/30/2015    Procedure: ENDARTERECTOMY CAROTID WITH PRIMARY  CLOSURE OF ARTERY;  Surgeon: Angelia Mould, MD;  Location: [place];  Service: Vascular;  Laterality: Left;  . Back surgery      neck surgery x 2  . Fracture surgery Right     ankle  . Cystoscopy w/ retrogrades Bilateral 03/03/2015    Procedure: CYSTOSCOPY WITH LEFT RETROGRADE PYELOGRAM;  Surgeon: Kathie Rhodes, MD;  Location: [facility] ORS;  Service: Urology;  Laterality: Bilateral;  . Transurethral resection of bladder tumor with gyrus (turbt-gyrus) N/A 03/03/2015    Procedure: TRANSURETHRAL RESECTION OF BLADDER TUMOR;  Surgeon: Kathie Rhodes, MD;  Location: [facility] ORS;  Service: Urology;  Laterality: N/A;  . Transurethral resection of bladder tumor with gyrus (turbt-gyrus) N/A 04/28/2015    Procedure: TRANSURETHRAL RESECTION OF BLADDER TUMOR ;  Surgeon: Kathie Rhodes, MD;  Location: [facility] ORS;  Service: Urology;  Laterality: N/A;  . Eye surgery      cataract surgery bilat   .  Transurethral resection of bladder tumor with gyrus (turbt-gyrus) N/A 11/24/2015    Procedure: TRANSURETHRAL RESECTION OF BLADDER TUMOR WITH GYRUS (TURBT-GYRUS);  Surgeon: Kathie Rhodes, MD;  Location: [facility] ORS;  Service: Urology;  Laterality: N/A;   Social History:  reports that he quit smoking about 2 months ago. His smoking use included Cigarettes. He has a 100 pack-year smoking history. He has never used smokeless tobacco. He reports that he does not drink alcohol or use illicit drugs. Where does patient live home. Can patient participate in ADLs? Yes.  Allergies  Allergen Reactions  . Other Itching and Nausea And Vomiting    The iv dye used in Fundus Flourescein Angiography procedure     Family History:  Family History  Problem Relation Age of Onset  . CAD Mother   . Heart disease Mother   . Hypertension Mother   . Cirrhosis Brother   . Heart disease Father   . Hyperlipidemia Father   . Hypertension Father   . Heart attack Father       Prior to Admission medications   Medication Sig Start Date End Date Taking?  Authorizing Provider  acetaminophen (TYLENOL) 500 MG tablet Take 1,000 mg by mouth every 6 (six) hours as needed for moderate pain.   Yes Historical Provider, MD  amLODipine (NORVASC) 10 MG tablet Take 10 mg by mouth daily as needed (High BP).   Yes Historical Provider, MD  aspirin EC 81 MG EC tablet Take 1 tablet (81 mg total) by mouth daily. 11/19/14  Yes Wellington Hampshire, MD  atorvastatin (LIPITOR) 20 MG tablet Take 20 mg by mouth daily at 6 PM.    Yes Historical Provider, MD  clopidogrel (PLAVIX) 75 MG tablet Take 1 tablet (75 mg total) by mouth daily. 11/19/14  Yes Wellington Hampshire, MD  colchicine 0.6 MG tablet Take 1 tablet (0.6 mg total) by mouth 2 (two) times daily. 12/09/15  Yes Belva Crome, MD  Cranberry 500 MG CAPS Take 1 capsule by mouth at bedtime.   Yes Historical Provider, MD  ferrous sulfate 325 (65 FE) MG tablet Take 325 mg by mouth every evening.    Yes Historical Provider, MD  GLIPIZIDE XL 10 MG 24 hr tablet Take 10 mg by mouth 2 (two) times daily. 12/09/15  Yes Historical Provider, MD  hydrALAZINE (APRESOLINE) 50 MG tablet Take 50 mg by mouth 3 (three) times daily as needed (Will only take when blood pressure is running high. Will at least take once daily). 11/21/15  Yes Belva Crome, MD  Insulin Glargine (LANTUS) 100 UNIT/ML Solostar Pen Inject 10 Units into the skin every morning. Patient taking differently: Inject 14 Units into the skin daily with breakfast.  09/03/14  Yes Delfina Redwood, MD  isosorbide mononitrate (IMDUR) 120 MG 24 hr tablet TAKE 1 TABLET BY MOUTH EVERY DAY 09/08/15  Yes Belva Crome, MD  metoprolol (LOPRESSOR) 50 MG tablet TAKE 1 TABLET BY MOUTH TWICE DAILY 12/09/15  Yes Wellington Hampshire, MD  Multiple Vitamin (MULTIVITAMIN WITH MINERALS) TABS tablet Take 1 tablet by mouth daily.   Yes Historical Provider, MD  nitroGLYCERIN (NITROSTAT) 0.4 MG SL tablet PLACE 1 TABLET UNDER THE TONGUE EVERY 5 MINUTES AS NEEDED FOR CHEST PAIN 07/25/15  Yes Belva Crome, MD   pantoprazole (PROTONIX) 40 MG tablet TAKE 1 TABLET BY MOUTH DAILY 12/04/15  Yes Wellington Hampshire, MD  torsemide (DEMADEX) 20 MG tablet Take 2 tablets by mouth once a day 10/29/15  Yes Belva Crome, MD    Physical Exam: Filed Vitals:   12/17/15 2330 12/17/15 2345 12/18/15 0045 12/18/15 0100  BP: 120/54 134/48  137/40  Pulse: 59 59    Temp: 98.3 F (36.8 C)  98.2 F (36.8 C)   TempSrc: Oral  Oral   Resp: 18 19  15   Height:   5\' 6"  (1.676 m)   Weight:   157 lb 13.6 oz (71.6 kg)   SpO2: 97% 97%  100%     General:  Moderately built and nourished.  Eyes: Anicteric no pallor.  ENT: No discharge from the ears eyes nose or mouth.  Neck: No mass felt.  Cardiovascular: S1-S2 heard.  Respiratory: No rhonchi or crepitations.  Abdomen: Soft nontender bowel sounds present.  Skin: No rash.  Musculoskeletal: No edema.  Psychiatric: Appears normal.  Neurologic: Alert awake oriented to time place and person. Moves all extremities.  Labs on Admission:  Basic Metabolic Panel:  Recent Labs Lab 12/15/15 1346 12/17/15 2110  NA 140 135  K 4.7 4.2  CL 105 101  CO2 23 23  GLUCOSE 148* 236*  BUN 53* 65*  CREATININE 2.18* 2.68*  CALCIUM 8.9 8.4*   Liver Function Tests:  Recent Labs Lab 12/17/15 2110  AST 27  ALT 25  ALKPHOS 72  BILITOT 0.4  PROT 6.5  ALBUMIN 3.3*   No results for input(s): LIPASE, AMYLASE in the last 168 hours. No results for input(s): AMMONIA in the last 168 hours. CBC:  Recent Labs Lab 12/15/15 1346 12/17/15 2110  WBC 17.0* 11.0*  NEUTROABS 14.3* 7.3  HGB 11.9* 7.8*  HCT 35.8* 23.1*  MCV 85.0 84.6  PLT 272 251   Cardiac Enzymes:  Recent Labs Lab 12/17/15 2110  TROPONINI 0.95*    BNP (last 3 results) No results for input(s): BNP in the last 8760 hours.  ProBNP (last 3 results) No results for input(s): PROBNP in the last 8760 hours.  CBG:  Recent Labs Lab 12/17/15 2346  GLUCAP 215*    Radiological Exams on Admission: No  results found.  EKG: Independently reviewed. Normal sinus rhythm with RBBB and diffuse ST depression.  Assessment/Plan Principal Problem:   Non-ST elevation myocardial infarction (NSTEMI), type 2 (HCC) Active Problems:   OSA (obstructive sleep apnea)   COPD (chronic obstructive pulmonary disease) (HCC)   Type 2 diabetes mellitus with vascular disease (HCC)   CKD (chronic kidney disease) stage 3, GFR 30-59 ml/min   Chronic diastolic heart failure (HCC)   HTN (hypertension)   Hematuria   Acute blood loss anemia   Non-ST elevation MI (NSTEMI) ([place])   1. Non-ST elevation MI in the setting of worsening anemia due to ongoing hematuria - patient [place] chest pain-free but did have some chest pain last night. Patient at this time will receive 2 units of packed red blood cell transfusion. We'll cycle cardiac markers. I have requested cardiology consult. For now we will hold off patient's aspirin and Plavix and follow cardiology recommendations. Continue beta blockers and statins. When necessary nitroglycerin. 2. Hematuria with urinary obstruction present on Foley catheter - I have ordered for when necessary irrigation only. Consult patient's urologist Dr. Edwena Blow in a.m. For now I have placed patient on empiric antibiotics. Check urine cultures. 3. Acute on chronic kidney disease - probably from worsening anemia. Transfuse and recheck metabolic panel. 4. Diabetes mellitus type 2 - continue Glucotrol and follow sliding scale coverage. 5. Acute blood loss anemia - follow CBC after transfusion. 6. Hypertension -  patient only takes when necessary hydralazine and Norvasc. For now I have placed patient on when necessary IV hydralazine. 7. CHF with last EF measured at [facility] was more than 55% - I have ordered 1 dose of Lasix in between transfusion and we will continue patient's home dose of torsemide. Closely monitor intake output and respiratory status.  Chest x-ray pending.   DVT Prophylaxis SCDs.   Code Status: Full code.  Family Communication: Patient's wife.  Disposition Plan: Admit to inpatient.    Marliss Buttacavoli N. [facility] Pager [phone].  If 7PM-7AM, please contact night-coverage [email or web address] Password [email or web address] 12/18/2015, 1:42 AM

## 2015-12-18 NOTE — Consult Note (Signed)
Urology Consult   Physician requesting consult: Dr. Hal Hope  Reason for consult: Hematuria and clot retention  History of Present Illness: [name] is a 75 y.o. who underwent a TURBT for bladder cancer on 11/24/15 by Dr. Karsten Ro.  He recovered from this procedure and remained off Plavix initially until last Friday.  He started Plavix and then began having significant hematuria over the weekend and he presented to the ED on Monday for this reason.  He typically performs CIC but required an indwelling catheter at that time.  His catheter stopped draining by Tuesday and he presented to [facility] and was seen by Jiles Crocker, NP, who changed his catheter to an 18Fr catheter and irrigated a large amount of clot until it was draining light pink.  He then developed another episode of clot retention last evening and he presented to the ED at [facility].  He also admitted to chest pain at that time.  His Hgb was noted to have dropped from 11.9 on Monday to 7.1 indicating significant acute blood loss.  His catheter was irrigated at [facility] but could not be irrigated clear.  Furthermore, his troponin levels were elevated indicating concern about myocardial infarction likely related to demand ischemia from his acute anemia.  He last took Plavix on Sunday night.  He was given aspirin at [facility].  He was transferred to the ICU at [facility] early this morning.   Past Medical History  Diagnosis Date  . Hyperlipidemia   . Hypertension   . GERD (gastroesophageal reflux disease)   . Esophageal stricture     a. History of dilitation - Long-standing history of esophageal reflux   . 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 1999: LIMA to LAD, sequential SVG to diagonal 2 and diagonal 3, sequential SVG to PDA, acute marginal branch, and PL branch. b. Repeat CABG 2015 with SVG to D1, SVG to PDA, and SVG to OM.   Marland Kitchen Chronic combined systolic and diastolic CHF (congestive heart failure)  ([place])     a. Prior EF 45-50% in 08/2014. b. Improved EF >55% in 09/2014 at [facility].  . Type II diabetes mellitus ([place])   . Anemia     a. + FOBT 08/2014.  Marland Kitchen History of hiatal hernia   . Arthritis   . Chronic lower back pain   . CKD (chronic kidney disease) stage 3, GFR 30-59 ml/min   . Skin cancer   . Polymyalgia rheumatica ([place])   . SVT (supraventricular tachycardia) ([place])   . Atrial fibrillation ([place])   . Carotid artery occlusion     a. Duplex 12/2013: mild plaque bilat, 40-59% BICA. F/u due 12/2014.  Marland Kitchen Atherosclerosis of native arteries of the extremities with intermittent claudication     a. L external iliac stent 2001, bilateral SFA occlusions documented 2001.  Marland Kitchen OSA (obstructive sleep apnea)     "don't wear mask"   . Hypotonic bladder     Requiring in and out catheterization performed by the patient at home - occasional blood tinged urine (self-cath's daily for the last 2 years) and hematuria is chronic for him, followed by urology.  . MI (myocardial infarction) ([place]) 1999-02/2014    total of 7 MI per patient  . COPD (chronic obstructive pulmonary disease) ([place])     pt states he doesn't have  . CAD (coronary artery disease), native coronary artery 09/24/2013    a. CABG x 6 1999. b. Interim PCIs: Angioplasty SVG-PDA  2006.Taxus DES to native LCx in 2007.DES to SVG-PDA 2007, restented 2009.NSTEMI with PCI to SVG- PDA complicated by no-reflow/inferior infarction in 2012. c. Redo CABG 2015 with SVG-PD, SVG-D1, SVG-OM1.  . History of gout   . Bladder cancer ([place])   . Self-catheterizes urinary bladder   . Anginal pain ([place])     comes and goes; uses nitro to relieve   . Dysrhythmia   . Shortness of breath dyspnea   . History of bronchitis   . Daily headache     comes and goes pt denies daily currently   . Gait instability     Past Surgical History  Procedure Laterality Date  . Coronary stent placement      "I've had 8 put in; I presently have none" (08/26/2014)  . Cardiac  catheterization    . Intraoperative transesophageal echocardiogram N/A 03/01/2014    Procedure: INTRAOPERATIVE TRANSESOPHAGEAL ECHOCARDIOGRAM;  Surgeon: Gaye Pollack, MD;  Location: [facility] OR;  Service: Open Heart Surgery;  Laterality: N/A;  . Anterior cervical decomp/discectomy fusion    . Ankle fracture surgery Right ~ 1977    "10 plates and 7 screws placed in it"  . Tonsillectomy    . Cataract extraction w/ intraocular lens  implant, bilateral Bilateral 2000's  . Mohs surgery  X 2    "nose; nose"  . Skin cancer excision      "all over my head; ears; back  . Esophagogastroduodenoscopy (egd) with esophageal dilation  X 1  . Coronary artery bypass graft  1999  . Coronary artery bypass graft N/A 03/01/2014    Procedure: REDO CORONARY ARTERY BYPASS GRAFTING TIMES THREE, USING RIGHT GREATER SAPHENOUS VEIN VIA ENDOVEIN HARVEST.;  Surgeon: Gaye Pollack, MD;  Location: [place] OR;  Service: Open Heart Surgery;  Laterality: N/A;  . Left heart catheterization with coronary/graft angiogram  02/26/2014    Procedure: LEFT HEART CATHETERIZATION WITH Beatrix Fetters;  Surgeon: Sinclair Grooms, MD;  Location: [facility] CATH LAB;  Service: Cardiovascular;;  . Left heart catheterization with coronary/graft angiogram N/A 07/23/2014    Procedure: LEFT HEART CATHETERIZATION WITH Beatrix Fetters;  Surgeon: Sinclair Grooms, MD;  Location: [facility] CATH LAB;  Service: Cardiovascular;  Laterality: N/A;  . Left heart catheterization with coronary/graft angiogram N/A 08/30/2014    Procedure: LEFT HEART CATHETERIZATION WITH Beatrix Fetters;  Surgeon: Leonie Man, MD;  Location: [facility] CATH LAB;  Service: Cardiovascular;  Laterality: N/A;  . Abdominal aortagram N/A 11/27/2014    Procedure: ABDOMINAL Maxcine Ham;  Surgeon: Wellington Hampshire, MD;  Location: [place] CATH LAB;  Service: Cardiovascular;  Laterality: N/A;  . Multiple tooth extractions    . Colonoscopy    . Endarterectomy Left 01/30/2015    Procedure:  ENDARTERECTOMY CAROTID WITH PRIMARY CLOSURE OF ARTERY;  Surgeon: Angelia Mould, MD;  Location: [facility];  Service: Vascular;  Laterality: Left;  . Back surgery      neck surgery x 2  . Fracture surgery Right     ankle  . Cystoscopy w/ retrogrades Bilateral 03/03/2015    Procedure: CYSTOSCOPY WITH LEFT RETROGRADE PYELOGRAM;  Surgeon: Kathie Rhodes, MD;  Location: [facility] ORS;  Service: Urology;  Laterality: Bilateral;  . Transurethral resection of bladder tumor with gyrus (turbt-gyrus) N/A 03/03/2015    Procedure: TRANSURETHRAL RESECTION OF BLADDER TUMOR;  Surgeon: Kathie Rhodes, MD;  Location: [facility] ORS;  Service: Urology;  Laterality: N/A;  . Transurethral resection of bladder tumor with gyrus (turbt-gyrus) N/A 04/28/2015    Procedure: TRANSURETHRAL RESECTION  OF BLADDER TUMOR ;  Surgeon: Kathie Rhodes, MD;  Location: [facility] ORS;  Service: Urology;  Laterality: N/A;  . Eye surgery      cataract surgery bilat   . Transurethral resection of bladder tumor with gyrus (turbt-gyrus) N/A 11/24/2015    Procedure: TRANSURETHRAL RESECTION OF BLADDER TUMOR WITH GYRUS (TURBT-GYRUS);  Surgeon: Kathie Rhodes, MD;  Location: [facility] ORS;  Service: Urology;  Laterality: N/A;    Medications:  Home meds:    Medication List    ASK your doctor about these medications        acetaminophen 500 MG tablet  Commonly known as:  TYLENOL  Take 1,000 mg by mouth every 6 (six) hours as needed for moderate pain.     amLODipine 10 MG tablet  Commonly known as:  NORVASC  Take 10 mg by mouth daily as needed (High BP).     aspirin 81 MG EC tablet  Take 1 tablet (81 mg total) by mouth daily.     atorvastatin 20 MG tablet  Commonly known as:  LIPITOR  Take 20 mg by mouth daily at 6 PM.     clopidogrel 75 MG tablet  Commonly known as:  PLAVIX  Take 1 tablet (75 mg total) by mouth daily.     colchicine 0.6 MG tablet  Take 1 tablet (0.6 mg total) by mouth 2 (two) times daily.     Cranberry 500 MG Caps  Take 1 capsule by mouth at  bedtime.     ferrous sulfate 325 (65 FE) MG tablet  Take 325 mg by mouth every evening.     GLIPIZIDE XL 10 MG 24 hr tablet  Generic drug:  glipiZIDE  Take 10 mg by mouth 2 (two) times daily.     hydrALAZINE 50 MG tablet  Commonly known as:  APRESOLINE  Take 50 mg by mouth 3 (three) times daily as needed (Will only take when blood pressure is running high. Will at least take once daily).     Insulin Glargine 100 UNIT/ML Solostar Pen  Commonly known as:  LANTUS  Inject 10 Units into the skin every morning.     isosorbide mononitrate 120 MG 24 hr tablet  Commonly known as:  IMDUR  TAKE 1 TABLET BY MOUTH EVERY DAY     metoprolol 50 MG tablet  Commonly known as:  LOPRESSOR  TAKE 1 TABLET BY MOUTH TWICE DAILY     multivitamin with minerals Tabs tablet  Take 1 tablet by mouth daily.     nitroGLYCERIN 0.4 MG SL tablet  Commonly known as:  NITROSTAT  PLACE 1 TABLET UNDER THE TONGUE EVERY 5 MINUTES AS NEEDED FOR CHEST PAIN     pantoprazole 40 MG tablet  Commonly known as:  PROTONIX  TAKE 1 TABLET BY MOUTH DAILY     torsemide 20 MG tablet  Commonly known as:  DEMADEX  Take 2 tablets by mouth once a day        Scheduled Meds: . sodium chloride   Intravenous Once  . atorvastatin  20 mg Oral q1800  . cefTRIAXone (ROCEPHIN)  IV  1 g Intravenous QHS  . colchicine  0.6 mg Oral Daily  . ferrous sulfate  325 mg Oral QPM  . glipiZIDE  10 mg Oral BID WC  . insulin glargine  14 Units Subcutaneous Q breakfast  . isosorbide mononitrate  120 mg Oral Daily  . metoprolol  50 mg Oral BID  . multivitamin with minerals  1 tablet Oral Daily  .  pantoprazole  40 mg Oral Daily  . sodium chloride flush  3 mL Intravenous Q12H  . torsemide  40 mg Oral Daily   Continuous Infusions:  PRN Meds:.acetaminophen **OR** acetaminophen, nitroGLYCERIN, ondansetron **OR** ondansetron (ZOFRAN) IV  Allergies:  Allergies  Allergen Reactions  . Other Itching and Nausea And Vomiting    The iv dye used in  Fundus Flourescein Angiography procedure     Family History  Problem Relation Age of Onset  . CAD Mother   . Heart disease Mother   . Hypertension Mother   . Cirrhosis Brother   . Heart disease Father   . Hyperlipidemia Father   . Hypertension Father   . Heart attack Father     Social History:  reports that he quit smoking about 2 months ago. His smoking use included Cigarettes. He has a 100 pack-year smoking history. He has never used smokeless tobacco. He reports that he does not drink alcohol or use illicit drugs.  ROS: A complete review of systems was performed.  All systems are negative except for pertinent findings as noted.  Physical Exam:  Vital signs in last 24 hours: Temp:  [97.7 F (36.5 C)-98.9 F (37.2 C)] 98.2 F (36.8 C) (03/02 0516) Pulse Rate:  [59-84] 59 (03/01 2345) Resp:  [15-22] 16 (03/02 0449) BP: (120-197)/(28-83) 165/47 mmHg (03/02 0449) SpO2:  [96 %-100 %] 100 % (03/02 0449) Weight:  [71.6 kg (157 lb 13.6 oz)-75.751 kg (167 lb)] 71.6 kg (157 lb 13.6 oz) (03/02 0045) Constitutional:  Alert and oriented, No acute distress Cardiovascular: Regular rate and rhythm, No JVD Respiratory: Normal respiratory effort GI: Abdomen is soft, nontender, nondistended, no abdominal masses Genitourinary: No CVAT. Normal male phallus, testes are descended bilaterally and non-tender and without masses, scrotum is normal in appearance without lesions or masses, perineum is normal on inspection. Catheter indwelling with grossly red urine draining. Lymphatic: No lymphadenopathy Neurologic: Grossly intact, no focal deficits Psychiatric: Normal mood and affect  Laboratory Data:   Recent Labs  12/15/15 1346 12/17/15 2110 12/18/15 0140  WBC 17.0* 11.0* 11.4*  HGB 11.9* 7.8* 7.1*  HCT 35.8* 23.1* 21.4*  PLT 272 251 210     Recent Labs  12/15/15 1346 12/17/15 2110  NA 140 135  K 4.7 4.2  CL 105 101  GLUCOSE 148* 236*  BUN 53* 65*  CALCIUM 8.9 8.4*  CREATININE  2.18* 2.68*     Results for orders placed or performed during the hospital encounter of 12/17/15 (from the past 24 hour(s))  CBC with Differential     Status: Abnormal   Collection Time: 12/17/15  9:10 PM  Result Value Ref Range   WBC 11.0 (H) 4.0 - 10.5 K/uL   RBC 2.73 (L) 4.22 - 5.81 MIL/uL   Hemoglobin 7.8 (L) 13.0 - 17.0 g/dL   HCT 23.1 (L) 39.0 - 52.0 %   MCV 84.6 78.0 - 100.0 fL   MCH 28.6 26.0 - 34.0 pg   MCHC 33.8 30.0 - 36.0 g/dL   RDW 13.3 11.5 - 15.5 %   Platelets 251 150 - 400 K/uL   Neutrophils Relative % 66 %   Neutro Abs 7.3 1.7 - 7.7 K/uL   Lymphocytes Relative 21 %   Lymphs Abs 2.3 0.7 - 4.0 K/uL   Monocytes Relative 10 %   Monocytes Absolute 1.1 (H) 0.1 - 1.0 K/uL   Eosinophils Relative 2 %   Eosinophils Absolute 0.3 0.0 - 0.7 K/uL   Basophils Relative 1 %  Basophils Absolute 0.1 0.0 - 0.1 K/uL  Comprehensive metabolic panel     Status: Abnormal   Collection Time: 12/17/15  9:10 PM  Result Value Ref Range   Sodium 135 135 - 145 mmol/L   Potassium 4.2 3.5 - 5.1 mmol/L   Chloride 101 101 - 111 mmol/L   CO2 23 22 - 32 mmol/L   Glucose, Bld 236 (H) 65 - 99 mg/dL   BUN 65 (H) 6 - 20 mg/dL   Creatinine, Ser 2.68 (H) 0.61 - 1.24 mg/dL   Calcium 8.4 (L) 8.9 - 10.3 mg/dL   Total Protein 6.5 6.5 - 8.1 g/dL   Albumin 3.3 (L) 3.5 - 5.0 g/dL   AST 27 15 - 41 U/L   ALT 25 17 - 63 U/L   Alkaline Phosphatase 72 38 - 126 U/L   Total Bilirubin 0.4 0.3 - 1.2 mg/dL   GFR calc non Af Amer 22 (L) >60 mL/min   GFR calc Af Amer 25 (L) >60 mL/min   Anion gap 11 5 - 15  Troponin I     Status: Abnormal   Collection Time: 12/17/15  9:10 PM  Result Value Ref Range   Troponin I 0.95 (HH) <0.031 ng/mL  CBG monitoring, ED     Status: Abnormal   Collection Time: 12/17/15 11:46 PM  Result Value Ref Range   Glucose-Capillary 215 (H) 65 - 99 mg/dL  MRSA PCR Screening     Status: None   Collection Time: 12/18/15 12:37 AM  Result Value Ref Range   MRSA by PCR NEGATIVE NEGATIVE   CBC     Status: Abnormal   Collection Time: 12/18/15  1:40 AM  Result Value Ref Range   WBC 11.4 (H) 4.0 - 10.5 K/uL   RBC 2.49 (L) 4.22 - 5.81 MIL/uL   Hemoglobin 7.1 (L) 13.0 - 17.0 g/dL   HCT 21.4 (L) 39.0 - 52.0 %   MCV 85.9 78.0 - 100.0 fL   MCH 28.5 26.0 - 34.0 pg   MCHC 33.2 30.0 - 36.0 g/dL   RDW 13.7 11.5 - 15.5 %   Platelets 210 150 - 400 K/uL  Troponin I (q 6hr x 3)     Status: Abnormal   Collection Time: 12/18/15  2:03 AM  Result Value Ref Range   Troponin I 0.75 (HH) <0.031 ng/mL  Type and screen [place]     Status: None (Preliminary result)   Collection Time: 12/18/15  2:03 AM  Result Value Ref Range   ABO/RH(D) O POS    Antibody Screen NEG    Sample Expiration 12/21/2015    Unit Number [id number]    Blood Component Type RED CELLS,LR    Unit division 00    Status of Unit ISSUED    Transfusion Status OK TO TRANSFUSE    Crossmatch Result Compatible    Unit Number [id number]    Blood Component Type RED CELLS,LR    Unit division 00    Status of Unit ALLOCATED    Transfusion Status OK TO TRANSFUSE    Crossmatch Result Compatible   Prepare RBC     Status: None   Collection Time: 12/18/15  2:09 AM  Result Value Ref Range   Order Confirmation ORDER PROCESSED BY BLOOD BANK    Recent Results (from the past 240 hour(s))  MRSA PCR Screening     Status: None   Collection Time: 12/18/15 12:37 AM  Result Value Ref Range Status   MRSA by  PCR NEGATIVE NEGATIVE Final    Comment:        The GeneXpert MRSA Assay (FDA approved for NASAL specimens only), is one component of a comprehensive MRSA colonization surveillance program. It is not intended to diagnose MRSA infection nor to guide or monitor treatment for MRSA infections.     Renal Function:  Recent Labs  12/15/15 1346 12/17/15 2110  CREATININE 2.18* 2.68*   Estimated Creatinine Clearance: 21.8 mL/min (by C-G formula based on Cr of 2.68).  Procedure:  I removed his 18  Fr catheter and then placed a new 22 Fr 3 way hematuria catheter under sterile conditions.  I hand irrigated his catheter with removal of a large amount of dark red clot until his urine was basically clear.  I started him on CBI with NS as a precaution although do not have high suspicion of active bleeding from his GU tract at this point.  Impression/Recommendation 1. Bladder cancer: Will require outpatient follow up with Dr. Karsten Ro for ongoing surveillance and treatment after his acute hospitalization and medical issues are resolved.  2. Hematuria: He appears to have had significant acute blood loss although the active bleeding appears to have now likely stopped.  I agree with blood transfusion (which has been started) and would follow serial Hgb levels.  He is on CBI and this will be titrated off.  Although his active bleeding has stopped, he remains at high risk for recurrent bleeding considering the events of the last few days.  If he requires additional antiplatelet/anticoagulation therapy, I would just recommend a discussion with Dr. Karsten Ro regarding the risks/benefits as this may be reasonable in a controlled environment during his hospitalization.  Terresa Marlett,LES 12/18/2015, 5:52 AM    Pryor Curia MD  CC: Dr. Hal Hope

## 2015-12-18 NOTE — Progress Notes (Signed)
Pharmacy Antibiotic Note  Ostin DEVARIS GILLOTT is a 75 y.o. male admitted on 12/17/2015 with UTI.  Pharmacy has been consulted for Ceftriaxone dosing.  Plan: Ceftriaxone 1gm iv q24hr  Height: 5\' 6"  (167.6 cm) Weight: 157 lb 13.6 oz (71.6 kg) IBW/kg (Calculated) : 63.8  Temp (24hrs), Avg:98.3 F (36.8 C), Min:97.7 F (36.5 C), Max:98.9 F (37.2 C)   Recent Labs Lab 12/15/15 1346 12/17/15 2110 12/18/15 0140  WBC 17.0* 11.0* 11.4*  CREATININE 2.18* 2.68*  --     Estimated Creatinine Clearance: 21.8 mL/min (by C-G formula based on Cr of 2.68).    Allergies  Allergen Reactions  . Other Itching and Nausea And Vomiting    The iv dye used in Fundus Flourescein Angiography procedure     Antimicrobials this admission: Ceftriaxone 3/1 >>  Thank you for allowing pharmacy to be a part of this patient's care.  Nani Skillern Crowford 12/18/2015 5:07 AM

## 2015-12-18 NOTE — Progress Notes (Signed)
[facility] PROGRESS NOTE  Seve Schlangen Welcome [id number] DOB: 1941/04/27 DOA: 12/17/2015 PCP: Shirline Frees, MD  Assessment/Plan: 1. Severe hematuria -Mr Herron having a history of transitional cell cancer of bladder undergoing transurethral resection of bladder tumor on 11/24/2015 -Plavix have been held for procedure and restarted last Friday. Over the weekend developing significant hematuria -He was evaluated by urology overnight, placed on continuous bladder irrigation. -Antiplatelet therapy has been discontinued. -Plan to transfuse a total of 3 units of packed red blood cells today. Will follow repeat H&H. -This morning urine clearing up as continuous bladder irrigation likely to be titrated off.  2.  Probable demand ischemia -He reported having chest pain yesterday evening as EKG revealed ST segment depression. Troponins elevated at 0.95 and 0.75. -Likely resulting from 4 point drop in his hemoglobin (from 11.9 on 12/15/2015 at 27.1 on 12/18/2015) secondary to severe hematuria. -It appears this was precipitated by the restarting of Plavix therapy last Friday, developing hematuria over the weekend. -Will plan to transfuse with 3 units of packed red blood cells today. -Continue to hold antiplatelet therapy -He is currently chest pain-free.  -Cardiology consulted.  3.  History of bladder cancer. -Patient having a history of transitional cell cancer of bladder status post transurethral resection of bladder tumor on 11/24/2015. -He is being followed by urology -Currently addressing hematuria/acute blood loss anemia  4.  Acute blood loss anemia -Secondary to severe hematuria and context of bladder cancer status post TURBT. Recently went back on Plavix therapy. -He reports chest pain associated with generalized weakness, poor tolerance to physical exertion, and shortness of breath. -As outlined above plan to transfuse with a total of 3 units of packed red blood cells today.   -Urine clearing up this morning  5.  History of combined systolic and diastolic congestive heart failure -His last transthoracic echocardiogram was performed on 08/26/2014 that showed EF of 4550% with grade 2 diastolic dysfunction. -He is receiving blood transfusions today, will monitor volume status closely. -Plan to administer IV Lasix in between transfusions. -Continue torsemide 40 mg by mouth daily  6.  Hypertension. -Continue isosorbide mononitrate 120 mg by mouth daily, metoprolol 50 mg by mouth twice a day  7.  Insulin-dependent diabetes mellitus. -Perform Accu-Cheks before meals and at bedtime, continue with his eye 10 mg by mouth twice a day and Lantus 14 units subcutaneous daily  Code Status: Full code Family Communication: Spoke with his wife present at bedside Disposition Plan: Plan for blood transfusion today   Consultants:  Urology  Cardiology  Antibiotics:  Ceftriaxone  HPI/Subjective: Mr Lengle is a pleasant 75 year old gentleman with a history of transitional cell carcinoma of the bladder undergoing transurethral resection of bladder tumor on 11/24/2015, procedure performed by Dr. Karsten Ro of urology, history of ischemic cardiomyopathy, congestive heart failure, coronary artery disease status post coronary artery bypass grafting. His Plavix was held for recent urologic procedure. Plavix was restarted last Friday then developed significant hematuria over the weekend. He is was seen by urology last Tuesday where his catheter was changed to an 18 Fr and irrigated due to clot formation. Overnight he presented to [facility] with complaints of generalized weakness, chest pain, suprapubic pain. He was found to have clot retention. Lab work revealed a hemoglobin of 7.8, down from 11.9 ( 12/17/2015) he was transferred to [facility] for further management and treatment.  Objective: Filed Vitals:   12/18/15 0630 12/18/15 0700  BP: 145/39 177/45  Pulse:    Temp:    Resp: 18  17     Intake/Output Summary (Last 24 hours) at 12/18/15 0748 Last data filed at 12/18/15 0738  Gross per 24 hour  Intake    764 ml  Output   1810 ml  Net  -1046 ml   Filed Weights   12/17/15 2047 12/18/15 0045  Weight: 75.751 kg (167 lb) 71.6 kg (157 lb 13.6 oz)    Exam:   General:  Awake and alert, nontoxic-appearing states feeling better  Cardiovascular: 2/6 systolic ejection murmur  Respiratory: Normal respiratory effort, lungs are clear to auscultation bilaterally  Abdomen: Soft, has mild tenderness to palpation over suprapubic region, no rebound guarding or peritoneal signs.  Musculoskeletal: No edema   Data Reviewed: Basic Metabolic Panel:  Recent Labs Lab 12/15/15 1346 12/17/15 2110 12/18/15 0140  NA 140 135 137  K 4.7 4.2 4.4  CL 105 101 104  CO2 23 23 24   GLUCOSE 148* 236* 227*  BUN 53* 65* 69*  CREATININE 2.18* 2.68* 2.62*  CALCIUM 8.9 8.4* 8.4*   Liver Function Tests:  Recent Labs Lab 12/17/15 2110  AST 27  ALT 25  ALKPHOS 72  BILITOT 0.4  PROT 6.5  ALBUMIN 3.3*   No results for input(s): LIPASE, AMYLASE in the last 168 hours. No results for input(s): AMMONIA in the last 168 hours. CBC:  Recent Labs Lab 12/15/15 1346 12/17/15 2110 12/18/15 0140  WBC 17.0* 11.0* 11.4*  NEUTROABS 14.3* 7.3  --   HGB 11.9* 7.8* 7.1*  HCT 35.8* 23.1* 21.4*  MCV 85.0 84.6 85.9  PLT 272 251 210   Cardiac Enzymes:  Recent Labs Lab 12/17/15 2110 12/18/15 0203  TROPONINI 0.95* 0.75*   BNP (last 3 results) No results for input(s): BNP in the last 8760 hours.  ProBNP (last 3 results) No results for input(s): PROBNP in the last 8760 hours.  CBG:  Recent Labs Lab 12/17/15 2346  GLUCAP 215*    Recent Results (from the past 240 hour(s))  MRSA PCR Screening     Status: None   Collection Time: 12/18/15 12:37 AM  Result Value Ref Range Status   MRSA by PCR NEGATIVE NEGATIVE Final    Comment:        The GeneXpert MRSA Assay (FDA approved for  NASAL specimens only), is one component of a comprehensive MRSA colonization surveillance program. It is not intended to diagnose MRSA infection nor to guide or monitor treatment for MRSA infections.      Studies: No results found.  Scheduled Meds: . sodium chloride   Intravenous Once  . atorvastatin  20 mg Oral q1800  . cefTRIAXone (ROCEPHIN)  IV  1 g Intravenous QHS  . colchicine  0.6 mg Oral Daily  . ferrous sulfate  325 mg Oral QPM  . glipiZIDE  10 mg Oral BID WC  . insulin glargine  14 Units Subcutaneous Q breakfast  . isosorbide mononitrate  120 mg Oral Daily  . metoprolol  50 mg Oral BID  . multivitamin with minerals  1 tablet Oral Daily  . pantoprazole  40 mg Oral Daily  . sodium chloride flush  3 mL Intravenous Q12H  . torsemide  40 mg Oral Daily   Continuous Infusions:   Principal Problem:   Non-ST elevation myocardial infarction (NSTEMI), type 2 (HCC) Active Problems:   OSA (obstructive sleep apnea)   COPD (chronic obstructive pulmonary disease) (HCC)   Type 2 diabetes mellitus with vascular disease (HCC)   CKD (chronic kidney disease) stage 3, GFR 30-59 ml/min  Chronic diastolic heart failure (HCC)   HTN (hypertension)   Hematuria   Acute blood loss anemia   Non-ST elevation MI (NSTEMI) ([place])   ARF (acute renal failure) ([place])    Time spent:     Kelvin Cellar  [facility] Pager [phone]. If 7PM-7AM, please contact night-coverage at [email or web address], password [facility] 12/18/2015, 7:48 AM  LOS: 1 day

## 2015-12-18 NOTE — Progress Notes (Signed)
  Echocardiogram 2D Echocardiogram has been performed.  Diamond Nickel 12/18/2015, 3:54 PM

## 2015-12-18 NOTE — Progress Notes (Signed)
PHARMACY NOTE -  Rocephin  Pharmacy has been assisting with dosing of Rocephin for UTI. Dosage remains stable at 1gm IV q24h and need for further dosage adjustment appears unlikely at present.  This medication is not renally dose adjusted.   Will sign off at this time.  Please reconsult if a change in clinical status warrants re-evaluation of dosage.  Netta Cedars, PharmD, BCPS Pager: [phone] 12/18/2015@10 :52 AM

## 2015-12-18 NOTE — Consult Note (Signed)
Reason for Consult: elevated troponin   Referring Physician: Dr. Coralyn Pear   PCP:  Shirline Frees, MD  Primary Cardiologist:Dr. Jan Fireman is an 75 y.o. male.    Chief Complaint:  Pt admitted early AM 12/18/15 with urinary obstruction and acute anemia.  HPI: Asked to see 75 year old male with history of CAD with CABG ischemic cardiomyopathy, chronic combined systolic and diastolic heart failure, hyperlipidemia, essential hypertension, chronic kidney disease, and peripheral arterial disease with claudication and bil carotid stenosis with LCEA 2016).   Re-do CABG in 2015 with SVG->PDA; SVG->D1;SVG->OM1..Original CABG in 1999 with LIMA->LAD;  Seq SVG->D2 &D3; seq. SVG_>AM<PDA<& PL.    Last cath 08/2014 post re-do CABG with likely final occlusion of SVG->PDA from original surgery.  Mild progression of disease in the seq. Limb from SVG-> D2-D3 but not amenable to PCI. Severe native disease with near occlusion of LM with only a small circumflex residual and an LAD occlusion after septal perforators. The RCA is also known to be occluded. LVEDP at time was 30 mmHg.  Potential etiology for positive troponin prior to that cath could simply be elevated LVEDP in the setting of microvascular and some otherwise small vessel disease  Medical therapy.  Pt had been doing well from cardiac perspective.   Pt admitted early hours with urinary obstruction-hx of bladder cancer post transurethral resection of the bladder tumor on 11/24/2015 and has been off Plavix.  After restarting had hematuria. And one episode of chest pain on Monday and then early hours yesterday AM he did develop chest pain and improved with NTG- 2 and it took a while to relieve. (No associated symptoms of SOB, nausea or diaphoresis)  But none since arrival at the hospital. His hematuria increased he tells me it was just blood and he came to ER.  His H/H dropped from 11.8/35.8 to 7.8/23.1- he is receiving 2nd unit of blood  now. He has had no further chest pain.     EKG with RBBB (old) and more pronounced T wave inversion inf. Lateral leads.  Troponin 0.95 and now 0.75.  Cr elevated tough 10 mo ago was 2.19 and 2015 was 1.76 to 2.3.   Currently after irrigation and cysto urine is clear.    ECHO 08/26/2014: - Left ventricle: The cavity size was normal. Systolic function was mildly reduced. The estimated ejection fraction was in the range of 45% to 50%. There is akinesis of the inferior myocardium. Features are consistent with a pseudonormal left ventricular filling pattern, with concomitant abnormal relaxation and increased filling pressure (grade 2 diastolic dysfunction). - Mitral valve: There was mild regurgitation.  Impressions:  - Compared to the prior study, there has been no significant interval change.   Past Medical History  Diagnosis Date  . Hyperlipidemia   . Hypertension   . GERD (gastroesophageal reflux disease)   . Esophageal stricture     a. History of dilitation - Long-standing history of esophageal reflux   . 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 1999: LIMA to LAD, sequential SVG to diagonal 2 and diagonal 3, sequential SVG to PDA, acute marginal branch, and PL branch. b. Repeat CABG 2015 with SVG to D1, SVG to PDA, and SVG to OM.   Marland Kitchen Chronic combined systolic and diastolic CHF (congestive heart failure) ([place])     a. Prior EF 45-50% in 08/2014. b. Improved EF >55% in 09/2014 at [facility].  . Type II diabetes mellitus (  [place])   . Anemia     a. + FOBT 08/2014.  Marland Kitchen History of hiatal hernia   . Arthritis   . Chronic lower back pain   . CKD (chronic kidney disease) stage 3, GFR 30-59 ml/min   . Skin cancer   . Polymyalgia rheumatica ([place])   . SVT (supraventricular tachycardia) ([place])   . Atrial fibrillation ([place])   . Carotid artery occlusion     a. Duplex 12/2013: mild plaque bilat, 40-59% BICA. F/u due 12/2014.  Marland Kitchen Atherosclerosis of native arteries of the extremities with intermittent  claudication     a. L external iliac stent 2001, bilateral SFA occlusions documented 2001.  Marland Kitchen OSA (obstructive sleep apnea)     "don't wear mask"   . Hypotonic bladder     Requiring in and out catheterization performed by the patient at home - occasional blood tinged urine (self-cath's daily for the last 2 years) and hematuria is chronic for him, followed by urology.  . MI (myocardial infarction) ([place]) 1999-02/2014    total of 7 MI per patient  . COPD (chronic obstructive pulmonary disease) ([place])     pt states he doesn't have  . CAD (coronary artery disease), native coronary artery 09/24/2013    a. CABG x 6 1999. b. Interim PCIs: Angioplasty SVG-PDA 2006.Taxus DES to native LCx in 2007.DES to SVG-PDA 2007, restented 2009.NSTEMI with PCI to SVG- PDA complicated by no-reflow/inferior infarction in 2012. c. Redo CABG 2015 with SVG-PD, SVG-D1, SVG-OM1.  . History of gout   . Bladder cancer ([place])   . Self-catheterizes urinary bladder   . Anginal pain ([place])     comes and goes; uses nitro to relieve   . Dysrhythmia   . Shortness of breath dyspnea   . History of bronchitis   . Daily headache     comes and goes pt denies daily currently   . Gait instability     Past Surgical History  Procedure Laterality Date  . Coronary stent placement      "I've had 8 put in; I presently have none" (08/26/2014)  . Cardiac catheterization    . Intraoperative transesophageal echocardiogram N/A 03/01/2014    Procedure: INTRAOPERATIVE TRANSESOPHAGEAL ECHOCARDIOGRAM;  Surgeon: Gaye Pollack, MD;  Location: [facility] OR;  Service: Open Heart Surgery;  Laterality: N/A;  . Anterior cervical decomp/discectomy fusion    . Ankle fracture surgery Right ~ 1977    "10 plates and 7 screws placed in it"  . Tonsillectomy    . Cataract extraction w/ intraocular lens  implant, bilateral Bilateral 2000's  . Mohs surgery  X 2    "nose; nose"  . Skin cancer excision      "all over my head; ears; back  .  Esophagogastroduodenoscopy (egd) with esophageal dilation  X 1  . Coronary artery bypass graft  1999  . Coronary artery bypass graft N/A 03/01/2014    Procedure: REDO CORONARY ARTERY BYPASS GRAFTING TIMES THREE, USING RIGHT GREATER SAPHENOUS VEIN VIA ENDOVEIN HARVEST.;  Surgeon: Gaye Pollack, MD;  Location: [facility] OR;  Service: Open Heart Surgery;  Laterality: N/A;  . Left heart catheterization with coronary/graft angiogram  02/26/2014    Procedure: LEFT HEART CATHETERIZATION WITH Beatrix Fetters;  Surgeon: Sinclair Grooms, MD;  Location: [facility] CATH LAB;  Service: Cardiovascular;;  . Left heart catheterization with coronary/graft angiogram N/A 07/23/2014    Procedure: LEFT HEART CATHETERIZATION WITH Beatrix Fetters;  Surgeon: Sinclair Grooms, MD;  Location: [facility] CATH LAB;  Service: Cardiovascular;  Laterality: N/A;  . Left heart catheterization with coronary/graft angiogram N/A 08/30/2014    Procedure: LEFT HEART CATHETERIZATION WITH Beatrix Fetters;  Surgeon: Leonie Man, MD;  Location: [facility] CATH LAB;  Service: Cardiovascular;  Laterality: N/A;  . Abdominal aortagram N/A 11/27/2014    Procedure: ABDOMINAL Maxcine Ham;  Surgeon: Wellington Hampshire, MD;  Location: [facility] CATH LAB;  Service: Cardiovascular;  Laterality: N/A;  . Multiple tooth extractions    . Colonoscopy    . Endarterectomy Left 01/30/2015    Procedure: ENDARTERECTOMY CAROTID WITH PRIMARY CLOSURE OF ARTERY;  Surgeon: Angelia Mould, MD;  Location: [facility];  Service: Vascular;  Laterality: Left;  . Back surgery      neck surgery x 2  . Fracture surgery Right     ankle  . Cystoscopy w/ retrogrades Bilateral 03/03/2015    Procedure: CYSTOSCOPY WITH LEFT RETROGRADE PYELOGRAM;  Surgeon: Kathie Rhodes, MD;  Location: [facility] ORS;  Service: Urology;  Laterality: Bilateral;  . Transurethral resection of bladder tumor with gyrus (turbt-gyrus) N/A 03/03/2015    Procedure: TRANSURETHRAL RESECTION OF BLADDER TUMOR;  Surgeon: Kathie Rhodes, MD;  Location: [facility] ORS;  Service: Urology;  Laterality: N/A;  . Transurethral resection of bladder tumor with gyrus (turbt-gyrus) N/A 04/28/2015    Procedure: TRANSURETHRAL RESECTION OF BLADDER TUMOR ;  Surgeon: Kathie Rhodes, MD;  Location: [facility] ORS;  Service: Urology;  Laterality: N/A;  . Eye surgery      cataract surgery bilat   . Transurethral resection of bladder tumor with gyrus (turbt-gyrus) N/A 11/24/2015    Procedure: TRANSURETHRAL RESECTION OF BLADDER TUMOR WITH GYRUS (TURBT-GYRUS);  Surgeon: Kathie Rhodes, MD;  Location: [facility] ORS;  Service: Urology;  Laterality: N/A;    Family History  Problem Relation Age of Onset  . CAD Mother   . Heart disease Mother   . Hypertension Mother   . Cirrhosis Brother   . Heart disease Father   . Hyperlipidemia Father   . Hypertension Father   . Heart attack Father    Social History:  reports that he quit smoking about 2 months ago. His smoking use included Cigarettes. He has a 100 pack-year smoking history. He has never used smokeless tobacco. He reports that he does not drink alcohol or use illicit drugs.  Allergies:  Allergies  Allergen Reactions  . Other Itching and Nausea And Vomiting    The iv dye used in Fundus Flourescein Angiography procedure     OUTPATIENT MEDICATIONS: No current facility-administered medications on file prior to encounter.   Current Outpatient Prescriptions on File Prior to Encounter  Medication Sig Dispense Refill  . acetaminophen (TYLENOL) 500 MG tablet Take 1,000 mg by mouth every 6 (six) hours as needed for moderate pain.    Marland Kitchen amLODipine (NORVASC) 10 MG tablet Take 10 mg by mouth daily as needed (High BP).    Marland Kitchen aspirin EC 81 MG EC tablet Take 1 tablet (81 mg total) by mouth daily. 1 tablet 0  . atorvastatin (LIPITOR) 20 MG tablet Take 20 mg by mouth daily at 6 PM.     . clopidogrel (PLAVIX) 75 MG tablet Take 1 tablet (75 mg total) by mouth daily. 90 tablet 3  . colchicine 0.6 MG tablet Take 1 tablet (0.6  mg total) by mouth 2 (two) times daily. 60 tablet 1  . Cranberry 500 MG CAPS Take 1 capsule by mouth at bedtime.    . ferrous sulfate 325 (65 FE) MG tablet Take 325 mg by  mouth every evening.     Marland Kitchen GLIPIZIDE XL 10 MG 24 hr tablet Take 10 mg by mouth 2 (two) times daily.  3  . hydrALAZINE (APRESOLINE) 50 MG tablet Take 50 mg by mouth 3 (three) times daily as needed (Will only take when blood pressure is running high. Will at least take once daily). 90 tablet 6  . Insulin Glargine (LANTUS) 100 UNIT/ML Solostar Pen Inject 10 Units into the skin every morning. (Patient taking differently: Inject 14 Units into the skin daily with breakfast. ) 15 mL 1  . isosorbide mononitrate (IMDUR) 120 MG 24 hr tablet TAKE 1 TABLET BY MOUTH EVERY DAY 30 tablet 5  . metoprolol (LOPRESSOR) 50 MG tablet TAKE 1 TABLET BY MOUTH TWICE DAILY 60 tablet 6  . Multiple Vitamin (MULTIVITAMIN WITH MINERALS) TABS tablet Take 1 tablet by mouth daily.    . nitroGLYCERIN (NITROSTAT) 0.4 MG SL tablet PLACE 1 TABLET UNDER THE TONGUE EVERY 5 MINUTES AS NEEDED FOR CHEST PAIN 25 tablet 1  . pantoprazole (PROTONIX) 40 MG tablet TAKE 1 TABLET BY MOUTH DAILY 90 tablet 3  . torsemide (DEMADEX) 20 MG tablet Take 2 tablets by mouth once a day 60 tablet 6   CURRENT MEDICATIONS: Scheduled Meds: . sodium chloride   Intravenous Once  . atorvastatin  20 mg Oral q1800  . cefTRIAXone (ROCEPHIN)  IV  1 g Intravenous QHS  . colchicine  0.6 mg Oral Daily  . ferrous sulfate  325 mg Oral QPM  . glipiZIDE  10 mg Oral BID WC  . insulin aspart  0-15 Units Subcutaneous TID WC  . insulin aspart  0-5 Units Subcutaneous QHS  . insulin glargine  14 Units Subcutaneous Q breakfast  . isosorbide mononitrate  120 mg Oral Daily  . metoprolol  50 mg Oral BID  . multivitamin with minerals  1 tablet Oral Daily  . pantoprazole  40 mg Oral Daily  . sodium chloride flush  3 mL Intravenous Q12H  . torsemide  40 mg Oral Daily   Continuous Infusions:  PRN  Meds:.acetaminophen **OR** acetaminophen, hydrALAZINE, nitroGLYCERIN, ondansetron **OR** ondansetron (ZOFRAN) IV   Results for orders placed or performed during the hospital encounter of 12/17/15 (from the past 48 hour(s))  CBC with Differential     Status: Abnormal   Collection Time: 12/17/15  9:10 PM  Result Value Ref Range   WBC 11.0 (H) 4.0 - 10.5 K/uL   RBC 2.73 (L) 4.22 - 5.81 MIL/uL   Hemoglobin 7.8 (L) 13.0 - 17.0 g/dL   HCT 23.1 (L) 39.0 - 52.0 %   MCV 84.6 78.0 - 100.0 fL   MCH 28.6 26.0 - 34.0 pg   MCHC 33.8 30.0 - 36.0 g/dL   RDW 13.3 11.5 - 15.5 %   Platelets 251 150 - 400 K/uL   Neutrophils Relative % 66 %   Neutro Abs 7.3 1.7 - 7.7 K/uL   Lymphocytes Relative 21 %   Lymphs Abs 2.3 0.7 - 4.0 K/uL   Monocytes Relative 10 %   Monocytes Absolute 1.1 (H) 0.1 - 1.0 K/uL   Eosinophils Relative 2 %   Eosinophils Absolute 0.3 0.0 - 0.7 K/uL   Basophils Relative 1 %   Basophils Absolute 0.1 0.0 - 0.1 K/uL  Comprehensive metabolic panel     Status: Abnormal   Collection Time: 12/17/15  9:10 PM  Result Value Ref Range   Sodium 135 135 - 145 mmol/L   Potassium 4.2 3.5 - 5.1 mmol/L  Chloride 101 101 - 111 mmol/L   CO2 23 22 - 32 mmol/L   Glucose, Bld 236 (H) 65 - 99 mg/dL   BUN 65 (H) 6 - 20 mg/dL   Creatinine, Ser 2.68 (H) 0.61 - 1.24 mg/dL   Calcium 8.4 (L) 8.9 - 10.3 mg/dL   Total Protein 6.5 6.5 - 8.1 g/dL   Albumin 3.3 (L) 3.5 - 5.0 g/dL   AST 27 15 - 41 U/L   ALT 25 17 - 63 U/L   Alkaline Phosphatase 72 38 - 126 U/L   Total Bilirubin 0.4 0.3 - 1.2 mg/dL   GFR calc non Af Amer 22 (L) >60 mL/min   GFR calc Af Amer 25 (L) >60 mL/min    Comment: (NOTE) The eGFR has been calculated using the CKD EPI equation. This calculation has not been validated in all clinical situations. eGFR's persistently <60 mL/min signify possible Chronic Kidney Disease.    Anion gap 11 5 - 15  Troponin I     Status: Abnormal   Collection Time: 12/17/15  9:10 PM  Result Value Ref  Range   Troponin I 0.95 (HH) <0.031 ng/mL    Comment:        POSSIBLE MYOCARDIAL ISCHEMIA. SERIAL TESTING RECOMMENDED. CRITICAL RESULT CALLED TO, READ BACK BY AND VERIFIED WITH: CRANSTON,K AT 2147 ON [id number] BY CHERESNOWSKY,T   CBG monitoring, ED     Status: Abnormal   Collection Time: 12/17/15 11:46 PM  Result Value Ref Range   Glucose-Capillary 215 (H) 65 - 99 mg/dL  MRSA PCR Screening     Status: None   Collection Time: 12/18/15 12:37 AM  Result Value Ref Range   MRSA by PCR NEGATIVE NEGATIVE    Comment:        The GeneXpert MRSA Assay (FDA approved for NASAL specimens only), is one component of a comprehensive MRSA colonization surveillance program. It is not intended to diagnose MRSA infection nor to guide or monitor treatment for MRSA infections.   Basic metabolic panel     Status: Abnormal   Collection Time: 12/18/15  1:40 AM  Result Value Ref Range   Sodium 137 135 - 145 mmol/L   Potassium 4.4 3.5 - 5.1 mmol/L   Chloride 104 101 - 111 mmol/L   CO2 24 22 - 32 mmol/L   Glucose, Bld 227 (H) 65 - 99 mg/dL   BUN 69 (H) 6 - 20 mg/dL   Creatinine, Ser 2.62 (H) 0.61 - 1.24 mg/dL   Calcium 8.4 (L) 8.9 - 10.3 mg/dL   GFR calc non Af Amer 22 (L) >60 mL/min   GFR calc Af Amer 26 (L) >60 mL/min    Comment: (NOTE) The eGFR has been calculated using the CKD EPI equation. This calculation has not been validated in all clinical situations. eGFR's persistently <60 mL/min signify possible Chronic Kidney Disease.    Anion gap 9 5 - 15  CBC     Status: Abnormal   Collection Time: 12/18/15  1:40 AM  Result Value Ref Range   WBC 11.4 (H) 4.0 - 10.5 K/uL   RBC 2.49 (L) 4.22 - 5.81 MIL/uL   Hemoglobin 7.1 (L) 13.0 - 17.0 g/dL   HCT 21.4 (L) 39.0 - 52.0 %   MCV 85.9 78.0 - 100.0 fL   MCH 28.5 26.0 - 34.0 pg   MCHC 33.2 30.0 - 36.0 g/dL   RDW 13.7 11.5 - 15.5 %   Platelets 210 150 - 400 K/uL  Troponin I (  q 6hr x 3)     Status: Abnormal   Collection Time: 12/18/15  2:03 AM    Result Value Ref Range   Troponin I 0.75 (HH) <0.031 ng/mL    Comment:        POSSIBLE MYOCARDIAL ISCHEMIA. SERIAL TESTING RECOMMENDED. CRITICAL RESULT CALLED TO, READ BACK BY AND VERIFIED WITH: Bethann Humble RN [id number] 12/18/15 A NAVARRO   Type and screen [place]     Status: None (Preliminary result)   Collection Time: 12/18/15  2:03 AM  Result Value Ref Range   ABO/RH(D) O POS    Antibody Screen NEG    Sample Expiration 12/21/2015    Unit Number [id number]    Blood Component Type RED CELLS,LR    Unit division 00    Status of Unit ISSUED    Transfusion Status OK TO TRANSFUSE    Crossmatch Result Compatible    Unit Number [id number]    Blood Component Type RED CELLS,LR    Unit division 00    Status of Unit ISSUED    Transfusion Status OK TO TRANSFUSE    Crossmatch Result Compatible    Unit Number [id number]    Blood Component Type RED CELLS,LR    Unit division 00    Status of Unit ALLOCATED    Transfusion Status OK TO TRANSFUSE    Crossmatch Result Compatible   Prepare RBC     Status: None   Collection Time: 12/18/15  2:09 AM  Result Value Ref Range   Order Confirmation ORDER PROCESSED BY BLOOD BANK   Glucose, capillary     Status: Abnormal   Collection Time: 12/18/15  8:14 AM  Result Value Ref Range   Glucose-Capillary 145 (H) 65 - 99 mg/dL   No results found.  ROS: General:no colds or fevers, no weight changes Skin:no rashes or ulcers HEENT:no blurred vision, no congestion CV:see HPI PUL:see HPI GI:no diarrhea constipation or melena- bright blood on TP once this week, no indigestion GU:no hematuria, no dysuria MS:no joint pain, no claudication Neuro:no syncope, no lightheadedness Endo:+ diabetes-stable, no thyroid disease   Blood pressure 154/44, pulse 58, temperature 98.5 F (36.9 C), temperature source Oral, resp. rate 17, height '5\' 6"'$  (1.676 m), weight 157 lb 13.6 oz (71.6 kg), SpO2 100 %.  Wt Readings from Last 3 Encounters:   12/18/15 157 lb 13.6 oz (71.6 kg)  12/09/15 163 lb 3.2 oz (74.027 kg)  11/24/15 165 lb (74.844 kg)    PE: General:Pleasant affect, NAD Skin:Warm and dry, brisk capillary refill HEENT:normocephalic, sclera clear, mucus membranes moist Neck:supple, no JVD, no bruits  Heart:S1S2 RRR without murmur, gallup, rub or click Lungs:clear without rales, rhonchi, or wheezes WHQ:PRFF, non tender, + BS, do not palpate liver spleen or masses Ext:no lower ext edema, 1+ pedal pulses, 2+ radial pulses Neuro:alert and oriented X 3, MAE, follows commands, + facial symmetry GU: urine clear    Assessment/Plan Principal Problem:   Non-ST elevation myocardial infarction (NSTEMI), type 2 (HCC) Active Problems:   OSA (obstructive sleep apnea)   COPD (chronic obstructive pulmonary disease) (HCC)   Type 2 diabetes mellitus with vascular disease (HCC)   CKD (chronic kidney disease) stage 3, GFR 30-59 ml/min   Chronic diastolic heart failure (HCC)   HTN (hypertension)   Hematuria   Acute blood loss anemia   Non-ST elevation MI (NSTEMI) ([place])   ARF (acute renal failure) ([place])  1. Elevated troponin- pt with significant CAD and his last cath 08/2014 with patent  grafts except old VG-PDA from first CABG was occluded.  Dr. Ellyn Hack also thought pt with small vessel disease as well. With ongoing hematuria pt could be having demand ischemia vs NSTEMI more out of hospital.     No chest pain since 10 hours prior to arrival in ER.  Unable to add heparin at this time or even plavix/ASA.   --check Echo --Will need urology to help [place] decide when to add.  --Continue outpt meds otherwise.  2. CAD with CABG and Re-do CABG, last cath as above.  3. Acute blood loss anemia with hematuria, now receiving 2 nd unit PRBCs.  4. CKD-3  His elevated Cr. Is old.he is followed by Dr Posey Pronto with nephrology. Continue to monitor  5.  HTN- elevated at times. Continue outpt imdur, metoprolol hydralazine, amlodipine, no ace or arb with  renal insuff.   Dr. Marlou Porch to see.       [place]  Nurse Practitioner Certified [place] Pager [phone] or after 5pm or weekends call [phone] 12/18/2015, 8:29 AM     Personally seen and examined. Agree with above.  Currently getting PRBC which should help especially with ischemia.  ECHO pending.  History of redo CABG No current CP. ECG with global ischemia (diffuse ST depression) indicative of 3v CAD. Last cath 11/15. No crackles on exam. Foley in place.  Continue Bb, Imdur, Torsemide

## 2015-12-19 DIAGNOSIS — E1159 Type 2 diabetes mellitus with other circulatory complications: Secondary | ICD-10-CM

## 2015-12-19 DIAGNOSIS — R778 Other specified abnormalities of plasma proteins: Secondary | ICD-10-CM | POA: Insufficient documentation

## 2015-12-19 DIAGNOSIS — R7989 Other specified abnormal findings of blood chemistry: Secondary | ICD-10-CM

## 2015-12-19 LAB — BASIC METABOLIC PANEL
Anion gap: 8 (ref 5–15)
BUN: 55 mg/dL — AB (ref 6–20)
CHLORIDE: 107 mmol/L (ref 101–111)
CO2: 28 mmol/L (ref 22–32)
CREATININE: 2.18 mg/dL — AB (ref 0.61–1.24)
Calcium: 8.9 mg/dL (ref 8.9–10.3)
GFR calc Af Amer: 33 mL/min — ABNORMAL LOW (ref >60)
GFR calc non Af Amer: 28 mL/min — ABNORMAL LOW (ref >60)
GLUCOSE: 142 mg/dL — AB (ref 65–99)
Potassium: 4.7 mmol/L (ref 3.5–5.1)
SODIUM: 143 mmol/L (ref 135–145)

## 2015-12-19 LAB — TYPE AND SCREEN
ABO/RH(D): O POS
ANTIBODY SCREEN: NEGATIVE
UNIT DIVISION: 0
Unit division: 0
Unit division: 0

## 2015-12-19 LAB — GLUCOSE, CAPILLARY
GLUCOSE-CAPILLARY: 98 mg/dL (ref 65–99)
GLUCOSE-CAPILLARY: 136 mg/dL — AB (ref 65–99)
Glucose-Capillary: 156 mg/dL — ABNORMAL HIGH (ref 65–99)
Glucose-Capillary: 200 mg/dL — ABNORMAL HIGH (ref 65–99)

## 2015-12-19 LAB — CBC
HEMATOCRIT: 34.4 % — AB (ref 39.0–52.0)
Hemoglobin: 11.1 g/dL — ABNORMAL LOW (ref 13.0–17.0)
MCH: 28.2 pg (ref 26.0–34.0)
MCHC: 32.3 g/dL (ref 30.0–36.0)
MCV: 87.3 fL (ref 78.0–100.0)
PLATELETS: 195 10*3/uL (ref 150–400)
RBC: 3.94 MIL/uL — ABNORMAL LOW (ref 4.22–5.81)
RDW: 14 % (ref 11.5–15.5)
WBC: 11.4 10*3/uL — ABNORMAL HIGH (ref 4.0–10.5)

## 2015-12-19 MED ORDER — AMLODIPINE BESYLATE 10 MG PO TABS
10.0000 mg | ORAL_TABLET | Freq: Every day | ORAL | Status: DC
Start: 2015-12-19 — End: 2015-12-20
  Administered 2015-12-19 – 2015-12-20 (×2): 10 mg via ORAL
  Filled 2015-12-19 (×2): qty 1

## 2015-12-19 MED ORDER — ASPIRIN EC 81 MG PO TBEC
81.0000 mg | DELAYED_RELEASE_TABLET | Freq: Every day | ORAL | Status: DC
Start: 2015-12-19 — End: 2015-12-20
  Administered 2015-12-19 – 2015-12-20 (×2): 81 mg via ORAL
  Filled 2015-12-19 (×2): qty 1

## 2015-12-19 MED ORDER — CLOPIDOGREL BISULFATE 75 MG PO TABS
75.0000 mg | ORAL_TABLET | Freq: Every day | ORAL | Status: DC
  Administered 2015-12-19 – 2015-12-20 (×2): 75 mg via ORAL
  Filled 2015-12-19 (×2): qty 1

## 2015-12-19 NOTE — Progress Notes (Signed)
Patient Name: [name] Date of Encounter: 12/19/2015     Principal Problem:   Non-ST elevation myocardial infarction (NSTEMI), type 2 ([place]) Active Problems:   OSA (obstructive sleep apnea)   COPD (chronic obstructive pulmonary disease) (HCC)   Type 2 diabetes mellitus with vascular disease (HCC)   CKD (chronic kidney disease) stage 3, GFR 30-59 ml/min   Chronic diastolic heart failure (HCC)   HTN (hypertension)   Hematuria   Acute blood loss anemia   Non-ST elevation MI (NSTEMI) ([place])   ARF (acute renal failure) (HCC)    SUBJECTIVE  No complaints. No more chest pain. No SOB, orthopnea or PND.   CURRENT MEDS . sodium chloride   Intravenous Once  . atorvastatin  20 mg Oral q1800  . cefTRIAXone (ROCEPHIN)  IV  1 g Intravenous QHS  . colchicine  0.6 mg Oral Daily  . ferrous sulfate  325 mg Oral QPM  . glipiZIDE  10 mg Oral BID WC  . insulin aspart  0-15 Units Subcutaneous TID WC  . insulin aspart  0-5 Units Subcutaneous QHS  . insulin glargine  14 Units Subcutaneous Q breakfast  . isosorbide mononitrate  120 mg Oral Daily  . metoprolol  50 mg Oral BID  . multivitamin with minerals  1 tablet Oral Daily  . pantoprazole  40 mg Oral Daily  . sodium chloride flush  3 mL Intravenous Q12H  . torsemide  40 mg Oral Daily    OBJECTIVE  Filed Vitals:   12/19/15 0300 12/19/15 0400 12/19/15 0500 12/19/15 0600  BP: 154/28 143/37 145/26 170/43  Pulse:      Temp:  98.2 F (36.8 C)    TempSrc:  Oral    Resp: 13 18 18 15   Height:      Weight:   157 lb 3 oz (71.3 kg)   SpO2: 98% 97% 97% 97%    Intake/Output Summary (Last 24 hours) at 12/19/15 0814 Last data filed at 12/19/15 0400  Gross per 24 hour  Intake   1190 ml  Output   2725 ml  Net  -1535 ml   Filed Weights   12/17/15 2047 12/18/15 0045 12/19/15 0500  Weight: 167 lb (75.751 kg) 157 lb 13.6 oz (71.6 kg) 157 lb 3 oz (71.3 kg)    PHYSICAL EXAM  General: Pleasant, NAD. Neuro: Alert and oriented X 3. Moves  all extremities spontaneously. Psych: Normal affect. HEENT:  Normal  Neck: Supple without bruits or JVD. Lungs:  Resp regular and unlabored, CTA. Heart: RRR no s3, s4, or murmurs. Abdomen: Soft, non-tender, non-distended, BS + x 4.  Extremities: No clubbing, cyanosis or edema. DP/PT/Radials 2+ and equal bilaterally.  Accessory Clinical Findings  CBC  Recent Labs  12/17/15 2110 12/18/15 0140 12/19/15 0325  WBC 11.0* 11.4* 11.4*  NEUTROABS 7.3  --   --   HGB 7.8* 7.1* 11.1*  HCT 23.1* 21.4* 34.4*  MCV 84.6 85.9 87.3  PLT 251 210 0000000   Basic Metabolic Panel  Recent Labs  12/18/15 0140 12/19/15 0325  NA 137 143  K 4.4 4.7  CL 104 107  CO2 24 28  GLUCOSE 227* 142*  BUN 69* 55*  CREATININE 2.62* 2.18*  CALCIUM 8.4* 8.9   Liver Function Tests  Recent Labs  12/17/15 2110  AST 27  ALT 25  ALKPHOS 72  BILITOT 0.4  PROT 6.5  ALBUMIN 3.3*   No results for input(s): LIPASE, AMYLASE in the last 72 hours. Cardiac Enzymes  Recent Labs  12/18/15 0203 12/18/15 1339 12/18/15 1942  TROPONINI 0.75* 0.56* 0.48*    TELE  NSR with some PVCs  Radiology/Studies  Dg Chest Port 1 View  12/18/2015  CLINICAL DATA:  Chest pain, sob. EXAM: PORTABLE CHEST 1 VIEW COMPARISON:  01/17/2015 FINDINGS: Status post median sternotomy and CABG. Heart size is normal. Lungs are clear. IMPRESSION: No evidence for acute cardiopulmonary abnormality. Electronically Signed   By: Nolon Nations M.D.   On: 12/18/2015 08:31   2D ECHO: 12/18/2015 LV EF: 50% -  55% Study Conclusions - Left ventricle: The cavity size was normal. Wall thickness was normal. Systolic function was normal. The estimated ejection fraction was in the range of 50% to 55%. Probable hypokinesis of the basal-midinferior myocardium. Doppler parameters are consistent with abnormal left ventricular relaxation (grade 1 diastolic dysfunction). Impressions: - Recommend repeat study with Definity for more  accurate wall motion analysis.   ASSESSMENT AND PLAN [name] is a 75 y.o. male with a history of CAD s/p CABG (1999 with redo in 2015), ischemic cardiomyopathy, chronic combined S/D CHF, HLD, HTN, CK-MB, PAD with claudication and bil carotid stenosis with LCEA 2016, bladder cancer s/p transurethral resection of the bladder tumor on 11/24/2015 who presented to [facility] on 12/17/15 for hematuria and urinary obstruction after recently restarting his home Plavix. Additionally, he was found to be anemic requiring transfusion and had an episode of chest pain with mildly elevated troponin and cardiology was consulted.  Severe hematuria: [name] having a history of transitional cell cancer of bladder undergoing transurethral resection of bladder tumor on 11/24/2015. Plavix have been held for procedure and restarted last Friday. Over the weekend developing significant hematuria -He was evaluated by urology overnight, placed on continuous bladder irrigation and antiplatelet therapy was discontinued.  -He was transfuse a total of 3 units of packed red blood cells on 12/18/2015, follow-up hemoglobin 11.1.  -Urine now clear after continuous bladder irrigation. Seen by urology this AM and cleared to resume DAPT  Chest pain/mildly elevated troponin: Troponin low and flat ( 0.95--> 0.75--> 0.56--> 0.48) consistent with demand ischemia. Chest pain now completely resolved. This was all likely secondary to anemia.   CAD: pt with significant CAD and his last cath 08/2014 with patent grafts except old VG-PDA from first CABG was occluded. Dr. Ellyn Hack also thought pt with small vessel disease as well. Continue statin and BB. I was in room with urologist this AM. [name] has had no further bleeding. Apparently, bleeding is common and expected at this point post procedure; however, since he was on ASA and plavix he bled more than usual. Hopefully, this will not reoccur and we feel like we can start  back on ASA and plavix today. Will plan to keep him overnight to monitor for any more bleeding.   HTN: BP not well controlled. Continue outpt imdur, metoprolol, hydralazine, amlodipine. He is not currently getting his amlodipine 10mg . I will add this back today with elevated blood pressures.   Anemia: H/H dropped from 11.8/35.8 to 7.8/23.1. He received 3 units of PRBCs. H/H 11.1/34.4 today.   Ischemic CM/chronic combined S/D CHF: 2D echo this admission with low normal EF (50-55%) and G1DD. There is question of an inferior wall motion WMA requiring Definity contrast for better analysis. However, previous echo did show an inferior wall motion abnormality, so this would not be new. -- He appears euvolemic. Continue current regimen of Lopressor 50 mg BID, hydralazine + nitrates, and Torsemide 40mg   daily. No ACE/ARB due to CKD.   CKD: creat 2.18 today  Signed, Eileen Stanford PA-C  Pager [phone]  Personally seen and examined. Agree with above. ASA, Plavix restarted as well as amlodipine Watch for bleeding.  Post PRBC's No CP Reassuring ECHO (old inf WMA)  Marlou Porch, MARK, MD

## 2015-12-19 NOTE — Progress Notes (Signed)
Patient ID: [name], male   DOB: [birth year], 75 y.o.   MRN: [id number]  Subjective: Patient reports no urologic complaints. Specifically he denies any suprapubic discomfort or flank pain.  Objective: Vital signs in last 24 hours: Temp:  [98 F (36.7 C)-98.9 F (37.2 C)] 98.2 F (36.8 C) (03/03 0800) Resp:  [8-21] 15 (03/03 0600) BP: (130-176)/(14-52) 170/43 mmHg (03/03 0600) SpO2:  [95 %-100 %] 97 % (03/03 0600) Weight:  [71.3 kg (157 lb 3 oz)] 71.3 kg (157 lb 3 oz) (03/03 0500)A  Intake/Output from previous day: 03/02 0701 - 03/03 0700 In: 1474 [P.O.:480; Blood:944; IV Piggyback:50] Out: 2725 [Urine:2725] Intake/Output this shift:    Past Medical History  Diagnosis Date  . Hyperlipidemia   . Hypertension   . GERD (gastroesophageal reflux disease)   . Esophageal stricture     a. History of dilitation - Long-standing history of esophageal reflux   . 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 1999: LIMA to LAD, sequential SVG to diagonal 2 and diagonal 3, sequential SVG to PDA, acute marginal branch, and PL branch. b. Repeat CABG 2015 with SVG to D1, SVG to PDA, and SVG to OM.   Marland Kitchen Chronic combined systolic and diastolic CHF (congestive heart failure) ([place])     a. Prior EF 45-50% in 08/2014. b. Improved EF >55% in 09/2014 at [facility].  . Type II diabetes mellitus ([place])   . Anemia     a. + FOBT 08/2014.  Marland Kitchen History of hiatal hernia   . Arthritis   . Chronic lower back pain   . CKD (chronic kidney disease) stage 3, GFR 30-59 ml/min   . Skin cancer   . Polymyalgia rheumatica ([place])   . SVT (supraventricular tachycardia) ([place])   . Atrial fibrillation ([place])   . Carotid artery occlusion     a. Duplex 12/2013: mild plaque bilat, 40-59% BICA. F/u due 12/2014.  Marland Kitchen Atherosclerosis of native arteries of the extremities with intermittent claudication     a. L external iliac stent 2001, bilateral SFA occlusions documented 2001.  Marland Kitchen OSA (obstructive sleep apnea)     "don't wear mask"   . Hypotonic  bladder     Requiring in and out catheterization performed by the patient at home - occasional blood tinged urine (self-cath's daily for the last 2 years) and hematuria is chronic for him, followed by urology.  . MI (myocardial infarction) ([place]) 1999-02/2014    total of 7 MI per patient  . COPD (chronic obstructive pulmonary disease) ([place])     pt states he doesn't have  . CAD (coronary artery disease), native coronary artery 09/24/2013    a. CABG x 6 1999. b. Interim PCIs: Angioplasty SVG-PDA 2006.Taxus DES to native LCx in 2007.DES to SVG-PDA 2007, restented 2009.NSTEMI with PCI to SVG- PDA complicated by no-reflow/inferior infarction in 2012. c. Redo CABG 2015 with SVG-PD, SVG-D1, SVG-OM1.  . History of gout   . Bladder cancer ([place])   . Self-catheterizes urinary bladder   . Anginal pain ([place])     comes and goes; uses nitro to relieve   . Dysrhythmia   . Shortness of breath dyspnea   . History of bronchitis   . Daily headache     comes and goes pt denies daily currently   . Gait instability     Physical Exam:  Lungs - Normal respiratory effort, chest expands symmetrically.  Abdomen - Soft, non-tender & non-distended. Urine in his Foley catheter tubing is completely clear  with no clots. His bladder is draining. He is not on CBI.  Lab Results:  Recent Labs  12/17/15 2110 12/18/15 0140 12/19/15 0325  WBC 11.0* 11.4* 11.4*  HGB 7.8* 7.1* 11.1*  HCT 23.1* 21.4* 34.4*   BMET  Recent Labs  12/18/15 0140 12/19/15 0325  NA 137 143  K 4.4 4.7  CL 104 107  CO2 24 28  GLUCOSE 227* 142*  BUN 69* 55*  CREATININE 2.62* 2.18*  CALCIUM 8.4* 8.9   No results for input(s): LABURIN in the last 72 hours. Results for orders placed or performed during the hospital encounter of 12/17/15  MRSA PCR Screening     Status: None   Collection Time: 12/18/15 12:37 AM  Result Value Ref Range Status   MRSA by PCR NEGATIVE NEGATIVE Final    Comment:        The GeneXpert MRSA Assay  (FDA approved for NASAL specimens only), is one component of a comprehensive MRSA colonization surveillance program. It is not intended to diagnose MRSA infection nor to guide or monitor treatment for MRSA infections.     Studies/Results: No results found.  Assessment: Gross hematuria secondary to recent transurethral resection of the bladder tumor and Plavix. I discussed with the patient and his wife today the fact that it is not unusual to see some small bleeding from the previous bladder tumor resection site at about 2-3 weeks after surgery and this typically last for only a short period of time inpatients were not anticoagulated. Because of the Plavix he had more bleeding than usual however his Plavix was stopped, his bladder was irrigated free of clots yesterday and he has done well since that time. His urine currently remains clear with no clots and he has been off well of CBI for 24 hours. I have discussed with cardiology the fact that I feel it would be safe to  continue his aspirin and restart his Plavix today. His Foley catheter will be left indwelling to monitor his urine and if it remains clear overnight and I will remove his catheter and he should be ready for discharge.  Plan:  1. Continue aspirin and restart Plavix today. 2. Continue Foley catheter and monitor urine overnight. 3. Will remove Foley catheter in the morning if his urine has remained clear. 4. He should be ready for discharge in the morning with follow-up in my office as previously scheduled.  [name] C 12/19/2015, 8:52 AM

## 2015-12-19 NOTE — Progress Notes (Signed)
[facility] PROGRESS NOTE  [name] [id number] DOB: 1940/11/12 DOA: 12/17/2015 PCP: Shirline Frees, MD  Assessment/Plan: 1. Severe hematuria -[name] having a history of transitional cell cancer of bladder undergoing transurethral resection of bladder tumor on 11/24/2015 -Plavix have been held for procedure and restarted last Friday. Over the weekend developing significant hematuria -He was evaluated by urology overnight, placed on continuous bladder irrigation. -Antiplatelet therapy has been discontinued. -He was transfuse a total of 3 units of packed red blood cells on 12/18/2015, follow-up hemoglobin 11.1.  -Urine clearing up, continuous bladder irrigation has been discontinued.  2.  Probable demand ischemia -He reported having chest pain yesterday evening as EKG revealed ST segment depression. Troponins elevated at 0.95 and 0.75. -Likely resulting from 4 point drop in his hemoglobin (from 11.9 on 12/15/2015 at 27.1 on 12/18/2015) secondary to severe hematuria. -It appears this was precipitated by the restarting of Plavix therapy last Friday, developing hematuria over the weekend. -Will plan to transfuse with 3 units of packed red blood cells today. -Continue to hold antiplatelet therapy -Cardiology was consulted. -Transthoracic echocardiogram showed EF of 50-55% with grade 1 diastolic dysfunction. -Serial troponins trending down. Remains chest pain-free.  3.  History of bladder cancer. -Patient having a history of transitional cell cancer of bladder status post transurethral resection of bladder tumor on 11/24/2015. -He is being followed by urology  4.  Acute blood loss anemia -Secondary to severe hematuria and context of bladder cancer status post TURBT. Recently went back on Plavix therapy. -He reports chest pain associated with generalized weakness, poor tolerance to physical exertion, and shortness of breath.  -Urine clearing up  -States feeling much  better, repeat hemoglobin on 12/19/2015 stable at 11.1.  5.  History of combined systolic and diastolic congestive heart failure -Repeat transthoracic echocardiogram showing ejection fraction of 50-55% with grade 1 diastolic dysfunction -Continue torsemide 40 mg by mouth daily  6.  Hypertension. -Continue isosorbide mononitrate 120 mg by mouth daily, metoprolol 50 mg by mouth twice a day  7.  Insulin-dependent diabetes mellitus. -Perform Accu-Cheks before meals and at bedtime, continue with his eye 10 mg by mouth twice a day and Lantus 14 units subcutaneous daily  Code Status: Full code Family Communication: Spoke with his wife present at bedside Disposition Plan: Patient stabilizes, anticipate discharge in the next 24 hours. Will transfer to [place]   Consultants:  Urology  Cardiology  Antibiotics:  Ceftriaxone  HPI/Subjective: [name] is a pleasant 75 year old gentleman with a history of transitional cell carcinoma of the bladder undergoing transurethral resection of bladder tumor on 11/24/2015, procedure performed by Dr. Karsten Ro of urology, history of ischemic cardiomyopathy, congestive heart failure, coronary artery disease status post coronary artery bypass grafting. His Plavix was held for recent urologic procedure. Plavix was restarted last Friday then developed significant hematuria over the weekend. He is was seen by urology last Tuesday where his catheter was changed to an 18 Fr and irrigated due to clot formation. Overnight he presented to [facility] with complaints of generalized weakness, chest pain, suprapubic pain. He was found to have clot retention. Lab work revealed a hemoglobin of 7.8, down from 11.9 ( 12/17/2015) he was transferred to [facility] for further management and treatment.  Objective: Filed Vitals:   12/19/15 0500 12/19/15 0600  BP: 145/26 170/43  Pulse:    Temp:    Resp: 18 15    Intake/Output Summary (Last 24 hours) at 12/19/15 0704 Last data filed  at 12/19/15 0400  Gross  per 24 hour  Intake   1474 ml  Output   2725 ml  Net  -1251 ml   Filed Weights   12/17/15 2047 12/18/15 0045 12/19/15 0500  Weight: 75.751 kg (167 lb) 71.6 kg (157 lb 13.6 oz) 71.3 kg (157 lb 3 oz)    Exam:   General:  Nontoxic-appearing looks well, asking to go home.  Cardiovascular: 2/6 systolic ejection murmur  Respiratory: Normal respiratory effort, lungs are clear to auscultation bilaterally  Abdomen: Soft nontender nondistended positive bowel sounds  Musculoskeletal: No edema   Data Reviewed: Basic Metabolic Panel:  Recent Labs Lab 12/15/15 1346 12/17/15 2110 12/18/15 0140 12/19/15 0325  NA 140 135 137 143  K 4.7 4.2 4.4 4.7  CL 105 101 104 107  CO2 23 23 24 28   GLUCOSE 148* 236* 227* 142*  BUN 53* 65* 69* 55*  CREATININE 2.18* 2.68* 2.62* 2.18*  CALCIUM 8.9 8.4* 8.4* 8.9   Liver Function Tests:  Recent Labs Lab 12/17/15 2110  AST 27  ALT 25  ALKPHOS 72  BILITOT 0.4  PROT 6.5  ALBUMIN 3.3*   No results for input(s): LIPASE, AMYLASE in the last 168 hours. No results for input(s): AMMONIA in the last 168 hours. CBC:  Recent Labs Lab 12/15/15 1346 12/17/15 2110 12/18/15 0140 12/19/15 0325  WBC 17.0* 11.0* 11.4* 11.4*  NEUTROABS 14.3* 7.3  --   --   HGB 11.9* 7.8* 7.1* 11.1*  HCT 35.8* 23.1* 21.4* 34.4*  MCV 85.0 84.6 85.9 87.3  PLT 272 251 210 195   Cardiac Enzymes:  Recent Labs Lab 12/17/15 2110 12/18/15 0203 12/18/15 1339 12/18/15 1942  TROPONINI 0.95* 0.75* 0.56* 0.48*   BNP (last 3 results) No results for input(s): BNP in the last 8760 hours.  ProBNP (last 3 results) No results for input(s): PROBNP in the last 8760 hours.  CBG:  Recent Labs Lab 12/17/15 2346 12/18/15 0814 12/18/15 1136 12/18/15 2201  GLUCAP 215* 145* 167* 161*    Recent Results (from the past 240 hour(s))  MRSA PCR Screening     Status: None   Collection Time: 12/18/15 12:37 AM  Result Value Ref Range Status   MRSA by  PCR NEGATIVE NEGATIVE Final    Comment:        The GeneXpert MRSA Assay (FDA approved for NASAL specimens only), is one component of a comprehensive MRSA colonization surveillance program. It is not intended to diagnose MRSA infection nor to guide or monitor treatment for MRSA infections.      Studies: Dg Chest Port 1 View  12/18/2015  CLINICAL DATA:  Chest pain, sob. EXAM: PORTABLE CHEST 1 VIEW COMPARISON:  01/17/2015 FINDINGS: Status post median sternotomy and CABG. Heart size is normal. Lungs are clear. IMPRESSION: No evidence for acute cardiopulmonary abnormality. Electronically Signed   By: Nolon Nations M.D.   On: 12/18/2015 08:31    Scheduled Meds: . sodium chloride   Intravenous Once  . atorvastatin  20 mg Oral q1800  . cefTRIAXone (ROCEPHIN)  IV  1 g Intravenous QHS  . colchicine  0.6 mg Oral Daily  . ferrous sulfate  325 mg Oral QPM  . glipiZIDE  10 mg Oral BID WC  . insulin aspart  0-15 Units Subcutaneous TID WC  . insulin aspart  0-5 Units Subcutaneous QHS  . insulin glargine  14 Units Subcutaneous Q breakfast  . isosorbide mononitrate  120 mg Oral Daily  . metoprolol  50 mg Oral BID  . multivitamin with minerals  1 tablet Oral Daily  . pantoprazole  40 mg Oral Daily  . sodium chloride flush  3 mL Intravenous Q12H  . torsemide  40 mg Oral Daily   Continuous Infusions:   Principal Problem:   Non-ST elevation myocardial infarction (NSTEMI), type 2 (HCC) Active Problems:   OSA (obstructive sleep apnea)   COPD (chronic obstructive pulmonary disease) (HCC)   Type 2 diabetes mellitus with vascular disease (HCC)   CKD (chronic kidney disease) stage 3, GFR 30-59 ml/min   Chronic diastolic heart failure (HCC)   HTN (hypertension)   Hematuria   Acute blood loss anemia   Non-ST elevation MI (NSTEMI) ([place])   ARF (acute renal failure) ([place])    Time spent: 35 min     Kelvin Cellar  [facility] Pager [phone]. If 7PM-7AM, please contact  night-coverage at [email or web address], password [facility] 12/19/2015, 7:04 AM  LOS: 2 days

## 2015-12-19 NOTE — Progress Notes (Signed)
Patient transferred from ICU. Alert and orientedx3. Pleasant. Able to transfer from wheelchair to bed with one person assist. No c/o pain, no bleeding noted. Foley catheter intact draining yellowish urine. Will continue to monitor.

## 2015-12-20 DIAGNOSIS — N3289 Other specified disorders of bladder: Secondary | ICD-10-CM

## 2015-12-20 DIAGNOSIS — I248 Other forms of acute ischemic heart disease: Secondary | ICD-10-CM | POA: Insufficient documentation

## 2015-12-20 DIAGNOSIS — I209 Angina pectoris, unspecified: Secondary | ICD-10-CM

## 2015-12-20 DIAGNOSIS — I10 Essential (primary) hypertension: Secondary | ICD-10-CM

## 2015-12-20 DIAGNOSIS — N183 Chronic kidney disease, stage 3 (moderate): Secondary | ICD-10-CM

## 2015-12-20 DIAGNOSIS — R079 Chest pain, unspecified: Secondary | ICD-10-CM | POA: Insufficient documentation

## 2015-12-20 LAB — GLUCOSE, CAPILLARY: Glucose-Capillary: 89 mg/dL (ref 65–99)

## 2015-12-20 MED ORDER — CEFUROXIME AXETIL 250 MG PO TABS
250.0000 mg | ORAL_TABLET | Freq: Two times a day (BID) | ORAL | Status: DC
Start: 2015-12-20 — End: 2016-01-28

## 2015-12-20 MED ORDER — HYDRALAZINE HCL 50 MG PO TABS
50.0000 mg | ORAL_TABLET | Freq: Three times a day (TID) | ORAL | Status: DC
  Administered 2015-12-20: 50 mg via ORAL
  Filled 2015-12-20: qty 1

## 2015-12-20 NOTE — Progress Notes (Signed)
  Subjective: Patient reports no suprapubic or flank pain. He indicates that he has not seen any hematuria over the past 24 hours.  Objective: Vital signs in last 24 hours: Temp:  [97.8 F (36.6 C)-98.5 F (36.9 C)] 98.5 F (36.9 C) (03/04 0507) Pulse Rate:  [59-71] 59 (03/04 0507) Resp:  [14-23] 18 (03/04 0507) BP: (141-173)/(38-72) 171/59 mmHg (03/04 0507) SpO2:  [96 %-100 %] 100 % (03/04 0507)  Intake/Output from previous day: 03/03 0701 - 03/04 0700 In: 600 [P.O.:600] Out: 1575 [Urine:1575] Intake/Output this shift:    Physical Exam:  General:alert, cooperative and no distress  abdomen is soft and nontender.  Foley catheter is draining and is completely clear with no clots.  Lab Results:  Recent Labs  12/17/15 2110 12/18/15 0140 12/19/15 0325  HGB 7.8* 7.1* 11.1*  HCT 23.1* 21.4* 34.4*   BMET  Recent Labs  12/18/15 0140 12/19/15 0325  NA 137 143  K 4.4 4.7  CL 104 107  CO2 24 28  GLUCOSE 227* 142*  BUN 69* 55*  CREATININE 2.62* 2.18*  CALCIUM 8.4* 8.9   No results for input(s): LABPT, INR in the last 72 hours. No results for input(s): LABURIN in the last 72 hours. Results for orders placed or performed during the hospital encounter of 12/17/15  MRSA PCR Screening     Status: None   Collection Time: 12/18/15 12:37 AM  Result Value Ref Range Status   MRSA by PCR NEGATIVE NEGATIVE Final    Comment:        The GeneXpert MRSA Assay (FDA approved for NASAL specimens only), is one component of a comprehensive MRSA colonization surveillance program. It is not intended to diagnose MRSA infection nor to guide or monitor treatment for MRSA infections.     Studies/Results: Dg Chest Port 1 View  12/18/2015  CLINICAL DATA:  Chest pain, sob. EXAM: PORTABLE CHEST 1 VIEW COMPARISON:  01/17/2015 FINDINGS: Status post median sternotomy and CABG. Heart size is normal. Lungs are clear. IMPRESSION: No evidence for acute cardiopulmonary abnormality.  Electronically Signed   By: Nolon Nations M.D.   On: 12/18/2015 08:31    Assessment/Plan: His gross hematuria has now resolved and he is back on Plavix and aspirin without recurrent bleeding..   1. DC Foley catheter 2. Okay to discharge home from GU standpoint. 3. His follow-up will be with me as previously scheduled.   LOS: 3 days   Beckett Maden C 12/20/2015, 8:07 AM

## 2015-12-20 NOTE — Progress Notes (Signed)
Patient Name: [name] Date of Encounter: 12/20/2015     Principal Problem:   Non-ST elevation myocardial infarction (NSTEMI), type 2 ([place]) Active Problems:   OSA (obstructive sleep apnea)   COPD (chronic obstructive pulmonary disease) (HCC)   Type 2 diabetes mellitus with vascular disease (HCC)   CKD (chronic kidney disease) stage 3, GFR 30-59 ml/min   Chronic diastolic heart failure (HCC)   HTN (hypertension)   Hematuria   Acute blood loss anemia   Non-ST elevation MI (NSTEMI) ([place])   ARF (acute renal failure) (HCC)   Elevated troponin   Bladder hemorrhage   Chest pain    SUBJECTIVE  No complaints. No more chest pain. No SOB, orthopnea or PND.   CURRENT MEDS . sodium chloride   Intravenous Once  . amLODipine  10 mg Oral Daily  . aspirin EC  81 mg Oral Daily  . atorvastatin  20 mg Oral q1800  . cefTRIAXone (ROCEPHIN)  IV  1 g Intravenous QHS  . clopidogrel  75 mg Oral Daily  . colchicine  0.6 mg Oral Daily  . ferrous sulfate  325 mg Oral QPM  . glipiZIDE  10 mg Oral BID WC  . insulin aspart  0-15 Units Subcutaneous TID WC  . insulin aspart  0-5 Units Subcutaneous QHS  . insulin glargine  14 Units Subcutaneous Q breakfast  . isosorbide mononitrate  120 mg Oral Daily  . metoprolol  50 mg Oral BID  . multivitamin with minerals  1 tablet Oral Daily  . pantoprazole  40 mg Oral Daily  . sodium chloride flush  3 mL Intravenous Q12H  . torsemide  40 mg Oral Daily    OBJECTIVE  Filed Vitals:   12/19/15 1400 12/19/15 1424 12/19/15 2121 12/20/15 0507  BP:  152/45 150/46 171/59  Pulse:  71 59 59  Temp:  98.1 F (36.7 C) 97.9 F (36.6 C) 98.5 F (36.9 C)  TempSrc:  Oral Oral Oral  Resp: 17 21 18 18   Height:      Weight:      SpO2: 98% 99% 97% 100%    Intake/Output Summary (Last 24 hours) at 12/20/15 0840 Last data filed at 12/20/15 0508  Gross per 24 hour  Intake    600 ml  Output   1575 ml  Net   -975 ml   Filed Weights   12/17/15 2047 12/18/15  0045 12/19/15 0500  Weight: 167 lb (75.751 kg) 157 lb 13.6 oz (71.6 kg) 157 lb 3 oz (71.3 kg)    PHYSICAL EXAM  General: Pleasant, NAD. Neuro: Alert and oriented X 3. Moves all extremities spontaneously. Psych: Normal affect. HEENT:  Normal  Neck: Supple without bruits or JVD. Lungs:  Resp regular and unlabored, CTA. Heart: RRR no s3, s4, or murmurs. Abdomen: Soft, non-tender, non-distended, BS + x 4.  Extremities: No clubbing, cyanosis or edema. DP/PT/Radials 2+ and equal bilaterally.  Accessory Clinical Findings  CBC  Recent Labs  12/17/15 2110 12/18/15 0140 12/19/15 0325  WBC 11.0* 11.4* 11.4*  NEUTROABS 7.3  --   --   HGB 7.8* 7.1* 11.1*  HCT 23.1* 21.4* 34.4*  MCV 84.6 85.9 87.3  PLT 251 210 0000000   Basic Metabolic Panel  Recent Labs  12/18/15 0140 12/19/15 0325  NA 137 143  K 4.4 4.7  CL 104 107  CO2 24 28  GLUCOSE 227* 142*  BUN 69* 55*  CREATININE 2.62* 2.18*  CALCIUM 8.4* 8.9   Liver Function Tests  Recent Labs  12/17/15 2110  AST 27  ALT 25  ALKPHOS 72  BILITOT 0.4  PROT 6.5  ALBUMIN 3.3*   No results for input(s): LIPASE, AMYLASE in the last 72 hours. Cardiac Enzymes  Recent Labs  12/18/15 0203 12/18/15 1339 12/18/15 1942  TROPONINI 0.75* 0.56* 0.48*    TELE  NSR with some PVCs  Radiology/Studies  Dg Chest Port 1 View  12/18/2015  CLINICAL DATA:  Chest pain, sob. EXAM: PORTABLE CHEST 1 VIEW COMPARISON:  01/17/2015 FINDINGS: Status post median sternotomy and CABG. Heart size is normal. Lungs are clear. IMPRESSION: No evidence for acute cardiopulmonary abnormality. Electronically Signed   By: Nolon Nations M.D.   On: 12/18/2015 08:31   2D ECHO: 12/18/2015 LV EF: 50% -  55% Study Conclusions - Left ventricle: The cavity size was normal. Wall thickness was normal. Systolic function was normal. The estimated ejection fraction was in the range of 50% to 55%. Probable hypokinesis of the basal-midinferior myocardium.  Doppler parameters are consistent with abnormal left ventricular relaxation (grade 1 diastolic dysfunction). Impressions: - Recommend repeat study with Definity for more accurate wall motion analysis.   ASSESSMENT AND PLAN Chistian C Buist is a 75 y.o. male with a history of CAD s/p CABG (1999 with redo in 2015), ischemic cardiomyopathy, chronic combined S/D CHF, HLD, HTN, CK-MB, PAD with claudication and bil carotid stenosis with LCEA 2016, bladder cancer s/p transurethral resection of the bladder tumor on 11/24/2015 who presented to [facility] on 12/17/15 for hematuria and urinary obstruction after recently restarting his home Plavix. Additionally, he was found to be anemic requiring transfusion and had an episode of chest pain with mildly elevated troponin and cardiology was consulted.  Severe hematuria: Mr Kromer having a history of transitional cell cancer of bladder undergoing transurethral resection of bladder tumor on 11/24/2015. Plavix have been held for procedure and restarted last Friday. Over the weekend developing significant hematuria  -He was transfuse a total of 3 units of packed red blood cells on 12/18/2015, follow-up hemoglobin 11.1.   -Urine now clear after continuous bladder irrigation.   Chest pain/mildly elevated troponin: Troponin low and flat ( 0.95--> 0.75--> 0.56--> 0.48) consistent with demand ischemia. Chest pain now completely resolved. This was all likely secondary to anemia.   CAD: pt with significant CAD and his last cath 08/2014 with patent grafts except old VG-PDA from first CABG was occluded. Dr. Ellyn Hack also thought pt with small vessel disease as well. Continue statin and BB Plavix and ASA.   HTN: BP challenging to controlled. Continue outpt imdur, metoprolol, hydralazine, amlodipine (restarted 3/3). Restarting hydralazine (takes PRN at home)    Anemia: H/H dropped from 11.8/35.8 to 7.8/23.1. He received 3 units of PRBCs. H/H 11.1/34.4      Ischemic CM/chronic combined S/D CHF: 2D echo this admission with low normal EF (50-55%) and G1DD. There is question of an inferior wall motion WMA requiring Definity contrast for better analysis. However, previous echo did show an inferior wall motion abnormality, so this would not be new. -- He appears euvolemic. Continue current regimen of Lopressor 50 mg BID, hydralazine + nitrates, and Torsemide 40mg  daily. No ACE/ARB due to CKD.   CKD: creat 2.18    OK to DC from our perspective  Signed, Candee Furbish MD

## 2015-12-20 NOTE — Care Management Note (Signed)
Case Management Note  Patient Details  Name: [name] MRN: [id number] Date of Birth: 1941-07-05  Subjective/Objective:     NSTEMI               Action/Plan: NCM spoke to pt and wife at bedside. Pt states he has RW and wheelchair. No NCM needs identified.   Expected Discharge Date:  12/20/2015               Expected Discharge Plan:  Home/Self Care  In-House Referral:  NA  Discharge planning Services  CM Consult  Post Acute Care Choice:  NA Choice offered to:  NA  DME Arranged:  N/A DME Agency:  NA  HH Arranged:  NA HH Agency:  NA  Status of Service:  Completed, signed off  Medicare Important Message Given:  Yes Date Medicare IM Given:    Medicare IM give by:    Date Additional Medicare IM Given:    Additional Medicare Important Message give by:     If discussed at [place] of Stay Meetings, dates discussed:    Additional Comments:  Erenest Rasher, RN 12/20/2015, 6:12 PM

## 2015-12-20 NOTE — Discharge Summary (Signed)
Physician Discharge Summary  [name] [id number] DOB: 1941/07/28 DOA: 12/17/2015  PCP: Shirline Frees, MD  Admit date: 12/17/2015 Discharge date: 12/20/2015  Time spent: 35 minutes  Recommendations for Outpatient Follow-up:  1. [facility] admitted for bladder hemorrhage/severe hematuria, undergoing transurethral resection of bladder tumor on 11/24/2015.  2. Has history of coronary artery disease, presented with demand ischemia. He was placed back on aspirin and Plavix. Please follow. Has upcoming upon with cardiology and urology. 3. During this hospitalization he was transfused with 3 units of packed red blood cells having hemoglobin of 11.1 on 12/19/2015   Discharge Diagnoses:  Principal Problem:   Non-ST elevation myocardial infarction (NSTEMI), type 2 (HCC) Active Problems:   OSA (obstructive sleep apnea)   COPD (chronic obstructive pulmonary disease) (HCC)   Type 2 diabetes mellitus with vascular disease (HCC)   CKD (chronic kidney disease) stage 3, GFR 30-59 ml/min   Chronic diastolic heart failure (HCC)   HTN (hypertension)   Hematuria   Acute blood loss anemia   Non-ST elevation MI (NSTEMI) (HCC)   ARF (acute renal failure) (HCC)   Elevated troponin   Bladder hemorrhage   Chest pain   Discharge Condition: Improved  Diet recommendation: Heart healthy  Filed Weights   12/17/15 2047 12/18/15 0045 12/19/15 0500  Weight: 75.751 kg (167 lb) 71.6 kg (157 lb 13.6 oz) 71.3 kg (157 lb 3 oz)    History of present illness:  [name] is a 75 y.o. male with history of bladder cancer who had transurethral resection of the bladder tumor on 11/24/2015 and was off Plavix and was restarted last Friday 6 days ago following which patient started developing hematuria and had gone to ER 2 days ago because of obstruction and patient had Foley catheter placed and was advised to follow with urologist. Patient returns back to the ER yesterday because of worsening hematuria and  urinary obstruction. Patient also had transient chest pain night before this which lasted for about half an hour with pressure-like which improved with nitroglycerin. In the ER patient's troponin is found to be mildly elevated with EKG showing diffuse ST depression more pronounced than before. Patient is currently chest pain-free. Hemoglobin has dropped by 4 g from recent. Patient has been admitted for further management of non-ST elevation MI and urinary obstruction with hematuria. Patient has stopped taking Plavix 3 days ago but still is on aspirin.   Hospital Course:  [name] is a pleasant 75 year old gentleman with a history of transitional cell carcinoma of the bladder undergoing transurethral resection of bladder tumor on 11/24/2015, procedure performed by Dr. Karsten Ro of urology, history of ischemic cardiomyopathy, congestive heart failure, coronary artery disease status post coronary artery bypass grafting. His Plavix was held for recent urologic procedure. Plavix was restarted last Friday then developed significant hematuria over the weekend. He is was seen by urology last Tuesday where his catheter was changed to an 18 Fr and irrigated due to clot formation. Overnight he presented to [facility] with complaints of generalized weakness, chest pain, suprapubic pain. He was found to have clot retention. Lab work revealed a hemoglobin of 7.8, down from 11.9 ( 12/17/2015) he was transferred to [facility] for further management and treatment.   Severe hematuria -[name] having a history of transitional cell cancer of bladder undergoing transurethral resection of bladder tumor on 11/24/2015 -Plavix have been held for procedure and restarted last Friday. Over the weekend developing significant hematuria -He was evaluated by urology, placed on continuous  bladder irrigation. -Antiplatelet therapy has been discontinued. -He was transfuse a total of 3 units of packed red blood cells on 12/18/2015, follow-up  hemoglobin 11.1.  -Urine clearing up, continuous bladder irrigation has been discontinued. -Urology felt it would be safe to restart aspirin and Plavix. He was monitored overnight with aspirin and Plavix therapy, did not have further episodes of hematuria.  2. Probable demand ischemia -He reported having chest pain yesterday evening as EKG revealed ST segment depression. Troponins elevated at 0.95 and 0.75. -Likely resulting from 4 point drop in his hemoglobin (from 11.9 on 12/15/2015 at 27.1 on 12/18/2015) secondary to severe hematuria. -It appears this was precipitated by the restarting of Plavix therapy last Friday, developing hematuria over the weekend. -He was transfused with 3 units of packed red blood cells during this hospitalization -Cardiology was consulted. -Transthoracic echocardiogram showed EF of 50-55% with grade 1 diastolic dysfunction. -Serial troponins trending down. Remains chest pain-free. -Aspirin and Plavix were restarted on 12/19/2015, having no further hematuria, will follow up with his primary cardiologist in the office  3. History of bladder cancer. -Patient having a history of transitional cell cancer of bladder status post transurethral resection of bladder tumor on 11/24/2015. -He is being followed by urology -We'll follow-up with his urologist in the office  4. Acute blood loss anemia -Secondary to severe hematuria and context of bladder cancer status post TURBT. Recently went back on Plavix therapy. -He reports chest pain associated with generalized weakness, poor tolerance to physical exertion, and shortness of breath.  -Urine clearing up  -States feeling much better, repeat hemoglobin on 12/19/2015 stable at 11.1.   Consultations:  Urology  Cardiology  Discharge Exam: Filed Vitals:   12/19/15 2121 12/20/15 0507  BP: 150/46 171/59  Pulse: 59 59  Temp: 97.9 F (36.6 C) 98.5 F (36.9 C)  Resp: 18 18     General: Nontoxic-appearing looks  well, asking to go home.  Cardiovascular: 2/6 systolic ejection murmur  Respiratory: Normal respiratory effort, lungs are clear to auscultation bilaterally  Abdomen: Soft nontender nondistended positive bowel sounds  Musculoskeletal: No edema  Discharge Instructions   Discharge Instructions    Call MD for:  difficulty breathing, headache or visual disturbances    Complete by:  As directed      Call MD for:  extreme fatigue    Complete by:  As directed      Call MD for:  hives    Complete by:  As directed      Call MD for:  persistant dizziness or light-headedness    Complete by:  As directed      Call MD for:  persistant nausea and vomiting    Complete by:  As directed      Call MD for:  redness, tenderness, or signs of infection (pain, swelling, redness, odor or green/yellow discharge around incision site)    Complete by:  As directed      Call MD for:  severe uncontrolled pain    Complete by:  As directed      Call MD for:  temperature >100.4    Complete by:  As directed      Call MD for:    Complete by:  As directed      Diet - low sodium heart healthy    Complete by:  As directed      Increase activity slowly    Complete by:  As directed  Current Discharge Medication List    START taking these medications   Details  cefUROXime (CEFTIN) 250 MG tablet Take 1 tablet (250 mg total) by mouth 2 (two) times daily with a meal. Qty: 10 tablet, Refills: 0      CONTINUE these medications which have NOT CHANGED   Details  acetaminophen (TYLENOL) 500 MG tablet Take 1,000 mg by mouth every 6 (six) hours as needed for moderate pain.    amLODipine (NORVASC) 10 MG tablet Take 10 mg by mouth daily as needed (High BP).    aspirin EC 81 MG EC tablet Take 1 tablet (81 mg total) by mouth daily. Qty: 1 tablet, Refills: 0   Associated Diagnoses: PAD (peripheral artery disease) (HCC)    atorvastatin (LIPITOR) 20 MG tablet Take 20 mg by mouth daily at 6 PM.      clopidogrel (PLAVIX) 75 MG tablet Take 1 tablet (75 mg total) by mouth daily. Qty: 90 tablet, Refills: 3   Associated Diagnoses: PAD (peripheral artery disease) (HCC)    colchicine 0.6 MG tablet Take 1 tablet (0.6 mg total) by mouth 2 (two) times daily. Qty: 60 tablet, Refills: 1    Cranberry 500 MG CAPS Take 1 capsule by mouth at bedtime.    ferrous sulfate 325 (65 FE) MG tablet Take 325 mg by mouth every evening.     GLIPIZIDE XL 10 MG 24 hr tablet Take 10 mg by mouth 2 (two) times daily. Refills: 3    hydrALAZINE (APRESOLINE) 50 MG tablet Take 50 mg by mouth 3 (three) times daily as needed (Will only take when blood pressure is running high. Will at least take once daily). Qty: 90 tablet, Refills: 6    Insulin Glargine (LANTUS) 100 UNIT/ML Solostar Pen Inject 10 Units into the skin every morning. Qty: 15 mL, Refills: 1    isosorbide mononitrate (IMDUR) 120 MG 24 hr tablet TAKE 1 TABLET BY MOUTH EVERY DAY Qty: 30 tablet, Refills: 5    metoprolol (LOPRESSOR) 50 MG tablet TAKE 1 TABLET BY MOUTH TWICE DAILY Qty: 60 tablet, Refills: 6    Multiple Vitamin (MULTIVITAMIN WITH MINERALS) TABS tablet Take 1 tablet by mouth daily.    nitroGLYCERIN (NITROSTAT) 0.4 MG SL tablet PLACE 1 TABLET UNDER THE TONGUE EVERY 5 MINUTES AS NEEDED FOR CHEST PAIN Qty: 25 tablet, Refills: 1    pantoprazole (PROTONIX) 40 MG tablet TAKE 1 TABLET BY MOUTH DAILY Qty: 90 tablet, Refills: 3    torsemide (DEMADEX) 20 MG tablet Take 2 tablets by mouth once a day Qty: 60 tablet, Refills: 6   Associated Diagnoses: CKD (chronic kidney disease) stage 3, GFR 30-59 ml/min       Allergies  Allergen Reactions  . Other Itching and Nausea And Vomiting    The iv dye used in Fundus Flourescein Angiography procedure    Follow-up Information    Follow up with Shirline Frees, MD In 2 weeks.   Specialty:  Family Medicine   Contact information:   [place] [address] [phone]        Follow up with Claybon Jabs, MD In 1 week.   Specialty:  Urology   Contact information:   [place] [address] [phone]       Follow up with Sinclair Grooms, MD In 1 week.   Specialty:  Cardiology   Contact information:   [id number] [place] [address] [phone]  The results of significant diagnostics from this hospitalization (including imaging, microbiology, ancillary and laboratory) are listed below for reference.    Significant Diagnostic Studies: Dg Chest Port 1 View  12/18/2015  CLINICAL DATA:  Chest pain, sob. EXAM: PORTABLE CHEST 1 VIEW COMPARISON:  01/17/2015 FINDINGS: Status post median sternotomy and CABG. Heart size is normal. Lungs are clear. IMPRESSION: No evidence for acute cardiopulmonary abnormality. Electronically Signed   By: Nolon Nations M.D.   On: 12/18/2015 08:31    Microbiology: Recent Results (from the past 240 hour(s))  MRSA PCR Screening     Status: None   Collection Time: 12/18/15 12:37 AM  Result Value Ref Range Status   MRSA by PCR NEGATIVE NEGATIVE Final    Comment:        The GeneXpert MRSA Assay (FDA approved for NASAL specimens only), is one component of a comprehensive MRSA colonization surveillance program. It is not intended to diagnose MRSA infection nor to guide or monitor treatment for MRSA infections.      Labs: Basic Metabolic Panel:  Recent Labs Lab 12/15/15 1346 12/17/15 2110 12/18/15 0140 12/19/15 0325  NA 140 135 137 143  K 4.7 4.2 4.4 4.7  CL 105 101 104 107  CO2 23 23 24 28   GLUCOSE 148* 236* 227* 142*  BUN 53* 65* 69* 55*  CREATININE 2.18* 2.68* 2.62* 2.18*  CALCIUM 8.9 8.4* 8.4* 8.9   Liver Function Tests:  Recent Labs Lab 12/17/15 2110  AST 27  ALT 25  ALKPHOS 72  BILITOT 0.4  PROT 6.5  ALBUMIN 3.3*   No results for input(s): LIPASE, AMYLASE in the last 168 hours. No results for input(s): AMMONIA in the last 168  hours. CBC:  Recent Labs Lab 12/15/15 1346 12/17/15 2110 12/18/15 0140 12/19/15 0325  WBC 17.0* 11.0* 11.4* 11.4*  NEUTROABS 14.3* 7.3  --   --   HGB 11.9* 7.8* 7.1* 11.1*  HCT 35.8* 23.1* 21.4* 34.4*  MCV 85.0 84.6 85.9 87.3  PLT 272 251 210 195   Cardiac Enzymes:  Recent Labs Lab 12/17/15 2110 12/18/15 0203 12/18/15 1339 12/18/15 1942  TROPONINI 0.95* 0.75* 0.56* 0.48*   BNP: BNP (last 3 results) No results for input(s): BNP in the last 8760 hours.  ProBNP (last 3 results) No results for input(s): PROBNP in the last 8760 hours.  CBG:  Recent Labs Lab 12/18/15 2201 12/19/15 0751 12/19/15 1139 12/19/15 1654 12/19/15 2150  GLUCAP 161* 156* 200* 98 136*       Signed:  Kelvin Cellar MD.  [facility] 12/20/2015, 7:57 AM

## 2015-12-20 NOTE — Care Management Important Message (Signed)
Important Message  Patient Details  Name: [name] MRN: [id number] Date of Birth: 11/27/1940   Medicare Important Message Given:  Yes    Erenest Rasher, RN 12/20/2015, 10:39 AM

## 2015-12-21 LAB — URINE CULTURE: Culture: 3000

## 2015-12-22 ENCOUNTER — Telehealth: Payer: Self-pay | Admitting: Interventional Cardiology

## 2015-12-22 NOTE — Telephone Encounter (Signed)
New Message  Pt called in states that he was recently in the hospital because her Gwynne Edinger out 3 pints of blood, had a heart attack, DOC came to see him// D/c'd on saturday tosee Dr. Tamala Julian this week. Spoke with Lattie Haw Dr. Darliss Ridgel nurse she states that she will call him back.

## 2015-12-22 NOTE — Telephone Encounter (Signed)
Forwarded to Dr.Smith to review and advise on f/u

## 2015-12-22 NOTE — Telephone Encounter (Signed)
I have reviewed the hospital records. There is no rush for the patient to come back to the cardiology clinic. He is back on the aspirin and Plavix. He didn't really have a heart attack. Needs to be seen sometime within the next 2-4 weeks either by myself or an APP.

## 2015-12-23 NOTE — Telephone Encounter (Signed)
Called to give pt Dr.Smith's response.[email or web address] 

## 2015-12-23 NOTE — Telephone Encounter (Signed)
Follow up   Pt calling returning rn call

## 2015-12-23 NOTE — Telephone Encounter (Signed)
Spoke with pt. Pt aware of Dr.Smith's response. Pt aware of 4 wk appt with Dr.Smith scheduled for 4/12 @ 9:am. Pt voiced appreciation for the call back and verbalized understanding

## 2015-12-24 ENCOUNTER — Other Ambulatory Visit: Payer: Self-pay | Admitting: Cardiovascular Disease

## 2015-12-24 DIAGNOSIS — I10 Essential (primary) hypertension: Secondary | ICD-10-CM | POA: Diagnosis not present

## 2015-12-24 DIAGNOSIS — N183 Chronic kidney disease, stage 3 (moderate): Secondary | ICD-10-CM | POA: Diagnosis not present

## 2015-12-24 DIAGNOSIS — N2581 Secondary hyperparathyroidism of renal origin: Secondary | ICD-10-CM | POA: Diagnosis not present

## 2015-12-24 DIAGNOSIS — D631 Anemia in chronic kidney disease: Secondary | ICD-10-CM | POA: Diagnosis not present

## 2015-12-24 DIAGNOSIS — N189 Chronic kidney disease, unspecified: Secondary | ICD-10-CM | POA: Diagnosis not present

## 2015-12-24 NOTE — Telephone Encounter (Signed)
Please review for refill. Thanks!  

## 2016-01-08 ENCOUNTER — Telehealth: Payer: Self-pay | Admitting: Cardiovascular Disease

## 2016-01-08 ENCOUNTER — Other Ambulatory Visit: Payer: Self-pay | Admitting: *Deleted

## 2016-01-08 MED ORDER — METOPROLOL TARTRATE 100 MG PO TABS: 50.0000 mg | ORAL_TABLET | Freq: Two times a day (BID) | ORAL | Status: DC

## 2016-01-08 NOTE — Telephone Encounter (Signed)
[facility] [address] calling stating  Pt is on metoprolol  50 mg but this medication is on back order They can do 100 mg and he can cut in half  Would like to know if this is okay If so please send in new prescription  Please advise.

## 2016-01-08 NOTE — Telephone Encounter (Signed)
Please review for refill, Thank you. 

## 2016-01-09 ENCOUNTER — Encounter: Payer: Self-pay | Admitting: Cardiovascular Disease

## 2016-01-09 NOTE — Telephone Encounter (Signed)
This encounter was created in error - please disregard.

## 2016-01-15 DIAGNOSIS — C678 Malignant neoplasm of overlapping sites of bladder: Secondary | ICD-10-CM | POA: Diagnosis not present

## 2016-01-15 DIAGNOSIS — Z5111 Encounter for antineoplastic chemotherapy: Secondary | ICD-10-CM | POA: Diagnosis not present

## 2016-01-15 DIAGNOSIS — Z0000 Encounter for general adult medical examination without abnormal findings: Secondary | ICD-10-CM | POA: Diagnosis not present

## 2016-01-16 DIAGNOSIS — N2581 Secondary hyperparathyroidism of renal origin: Secondary | ICD-10-CM | POA: Diagnosis not present

## 2016-01-16 DIAGNOSIS — N189 Chronic kidney disease, unspecified: Secondary | ICD-10-CM | POA: Diagnosis not present

## 2016-01-16 DIAGNOSIS — N183 Chronic kidney disease, stage 3 (moderate): Secondary | ICD-10-CM | POA: Diagnosis not present

## 2016-01-20 DIAGNOSIS — J111 Influenza due to unidentified influenza virus with other respiratory manifestations: Secondary | ICD-10-CM | POA: Diagnosis not present

## 2016-01-20 DIAGNOSIS — J40 Bronchitis, not specified as acute or chronic: Secondary | ICD-10-CM | POA: Diagnosis not present

## 2016-01-28 ENCOUNTER — Ambulatory Visit (INDEPENDENT_AMBULATORY_CARE_PROVIDER_SITE_OTHER): Payer: Medicare Other | Admitting: Interventional Cardiology

## 2016-01-28 ENCOUNTER — Encounter: Payer: Self-pay | Admitting: Interventional Cardiology

## 2016-01-28 VITALS — BP 136/50 | HR 84 | Ht 66.0 in | Wt 158.4 lb

## 2016-01-28 DIAGNOSIS — N183 Chronic kidney disease, stage 3 unspecified: Secondary | ICD-10-CM

## 2016-01-28 DIAGNOSIS — I110 Hypertensive heart disease with heart failure: Secondary | ICD-10-CM

## 2016-01-28 DIAGNOSIS — E785 Hyperlipidemia, unspecified: Secondary | ICD-10-CM

## 2016-01-28 DIAGNOSIS — I25708 Atherosclerosis of coronary artery bypass graft(s), unspecified, with other forms of angina pectoris: Secondary | ICD-10-CM | POA: Diagnosis not present

## 2016-01-28 DIAGNOSIS — I739 Peripheral vascular disease, unspecified: Secondary | ICD-10-CM | POA: Diagnosis not present

## 2016-01-28 DIAGNOSIS — I6523 Occlusion and stenosis of bilateral carotid arteries: Secondary | ICD-10-CM | POA: Diagnosis not present

## 2016-01-28 DIAGNOSIS — I10 Essential (primary) hypertension: Secondary | ICD-10-CM

## 2016-01-28 NOTE — Progress Notes (Signed)
Cardiology Office Note   Date:  01/28/2016   ID:  [name], DOB Mar 01, 1941, MRN [id number]  PCP:  Shirline Frees, MD  Cardiologist:  Sinclair Grooms, MD   Chief Complaint  Patient presents with  . Follow-up    cad      History of Present Illness: [name] is a 75 y.o. male who presents for Complicated CAD history, CABG on 2 prior occasions, chronic combined systolic and diastolic heart failure, stage III-for chronic kidney disease, hypertension, hyperlipidemia, diabetes mellitus, atrial fibrillation, PAD, and chronic anticoagulation therapy.  He had a non-ST elevation myocardial infarction based upon enzyme rise labeled as demand ischemia injury. He required 3 units of blood because of bleeding. Plavix therapy was discontinued. Bleeding was related to bladder cancer. He is now back on Plavix without recurrent bleeding.  He has some cough and dyspnea. This is related to a recent upper respiratory infection. He seems to be getting better.  Past Medical History  Diagnosis Date  . Hyperlipidemia   . Hypertension   . GERD (gastroesophageal reflux disease)   . Esophageal stricture     a. History of dilitation - Long-standing history of esophageal reflux   . 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 1999: LIMA to LAD, sequential SVG to diagonal 2 and diagonal 3, sequential SVG to PDA, acute marginal branch, and PL branch. b. Repeat CABG 2015 with SVG to D1, SVG to PDA, and SVG to OM.   Marland Kitchen Chronic combined systolic and diastolic CHF (congestive heart failure) ([place])     a. Prior EF 45-50% in 08/2014. b. Improved EF >55% in 09/2014 at [facility].  . Type II diabetes mellitus ([place])   . Anemia     a. + FOBT 08/2014.  Marland Kitchen History of hiatal hernia   . Arthritis   . Chronic lower back pain   . CKD (chronic kidney disease) stage 3, GFR 30-59 ml/min   . Skin cancer   . Polymyalgia rheumatica ([place])   . SVT (supraventricular tachycardia) ([place])   . Atrial fibrillation ([place])   . Carotid artery  occlusion     a. Duplex 12/2013: mild plaque bilat, 40-59% BICA. F/u due 12/2014.  Marland Kitchen Atherosclerosis of native arteries of the extremities with intermittent claudication     a. L external iliac stent 2001, bilateral SFA occlusions documented 2001.  Marland Kitchen OSA (obstructive sleep apnea)     "don't wear mask"   . Hypotonic bladder     Requiring in and out catheterization performed by the patient at home - occasional blood tinged urine (self-cath's daily for the last 2 years) and hematuria is chronic for him, followed by urology.  . MI (myocardial infarction) ([place]) 1999-02/2014    total of 7 MI per patient  . COPD (chronic obstructive pulmonary disease) ([place])     pt states he doesn't have  . CAD (coronary artery disease), native coronary artery 09/24/2013    a. CABG x 6 1999. b. Interim PCIs: Angioplasty SVG-PDA 2006.Taxus DES to native LCx in 2007.DES to SVG-PDA 2007, restented 2009.NSTEMI with PCI to SVG- PDA complicated by no-reflow/inferior infarction in 2012. c. Redo CABG 2015 with SVG-PD, SVG-D1, SVG-OM1.  . History of gout   . Bladder cancer ([place])   . Self-catheterizes urinary bladder   . Anginal pain ([place])     comes and goes; uses nitro to relieve   . Dysrhythmia   . Shortness of breath dyspnea   . History of bronchitis   .  Daily headache     comes and goes pt denies daily currently   . Gait instability     Past Surgical History  Procedure Laterality Date  . Coronary stent placement      "I've had 8 put in; I presently have none" (08/26/2014)  . Cardiac catheterization    . Intraoperative transesophageal echocardiogram N/A 03/01/2014    Procedure: INTRAOPERATIVE TRANSESOPHAGEAL ECHOCARDIOGRAM;  Surgeon: Gaye Pollack, MD;  Location: [facility] OR;  Service: Open Heart Surgery;  Laterality: N/A;  . Anterior cervical decomp/discectomy fusion    . Ankle fracture surgery Right ~ 1977    "10 plates and 7 screws placed in it"  . Tonsillectomy    . Cataract extraction w/ intraocular lens   implant, bilateral Bilateral 2000's  . Mohs surgery  X 2    "nose; nose"  . Skin cancer excision      "all over my head; ears; back  . Esophagogastroduodenoscopy (egd) with esophageal dilation  X 1  . Coronary artery bypass graft  1999  . Coronary artery bypass graft N/A 03/01/2014    Procedure: REDO CORONARY ARTERY BYPASS GRAFTING TIMES THREE, USING RIGHT GREATER SAPHENOUS VEIN VIA ENDOVEIN HARVEST.;  Surgeon: Gaye Pollack, MD;  Location: [facility] OR;  Service: Open Heart Surgery;  Laterality: N/A;  . Left heart catheterization with coronary/graft angiogram  02/26/2014    Procedure: LEFT HEART CATHETERIZATION WITH Beatrix Fetters;  Surgeon: Sinclair Grooms, MD;  Location: [facility] CATH LAB;  Service: Cardiovascular;;  . Left heart catheterization with coronary/graft angiogram N/A 07/23/2014    Procedure: LEFT HEART CATHETERIZATION WITH Beatrix Fetters;  Surgeon: Sinclair Grooms, MD;  Location: [facility] CATH LAB;  Service: Cardiovascular;  Laterality: N/A;  . Left heart catheterization with coronary/graft angiogram N/A 08/30/2014    Procedure: LEFT HEART CATHETERIZATION WITH Beatrix Fetters;  Surgeon: Leonie Man, MD;  Location: [facility] CATH LAB;  Service: Cardiovascular;  Laterality: N/A;  . Abdominal aortagram N/A 11/27/2014    Procedure: ABDOMINAL Maxcine Ham;  Surgeon: Wellington Hampshire, MD;  Location: [facility] CATH LAB;  Service: Cardiovascular;  Laterality: N/A;  . Multiple tooth extractions    . Colonoscopy    . Endarterectomy Left 01/30/2015    Procedure: ENDARTERECTOMY CAROTID WITH PRIMARY CLOSURE OF ARTERY;  Surgeon: Angelia Mould, MD;  Location: [facility];  Service: Vascular;  Laterality: Left;  . Back surgery      neck surgery x 2  . Fracture surgery Right     ankle  . Cystoscopy w/ retrogrades Bilateral 03/03/2015    Procedure: CYSTOSCOPY WITH LEFT RETROGRADE PYELOGRAM;  Surgeon: Kathie Rhodes, MD;  Location: [facility] ORS;  Service: Urology;  Laterality: Bilateral;  .  Transurethral resection of bladder tumor with gyrus (turbt-gyrus) N/A 03/03/2015    Procedure: TRANSURETHRAL RESECTION OF BLADDER TUMOR;  Surgeon: Kathie Rhodes, MD;  Location: [facility] ORS;  Service: Urology;  Laterality: N/A;  . Transurethral resection of bladder tumor with gyrus (turbt-gyrus) N/A 04/28/2015    Procedure: TRANSURETHRAL RESECTION OF BLADDER TUMOR ;  Surgeon: Kathie Rhodes, MD;  Location: [facility] ORS;  Service: Urology;  Laterality: N/A;  . Eye surgery      cataract surgery bilat   . Transurethral resection of bladder tumor with gyrus (turbt-gyrus) N/A 11/24/2015    Procedure: TRANSURETHRAL RESECTION OF BLADDER TUMOR WITH GYRUS (TURBT-GYRUS);  Surgeon: Kathie Rhodes, MD;  Location: [facility] ORS;  Service: Urology;  Laterality: N/A;     Current Outpatient Prescriptions  Medication Sig Dispense Refill  .  acetaminophen (TYLENOL) 500 MG tablet Take 1,000 mg by mouth every 6 (six) hours as needed for moderate pain.    Marland Kitchen amLODipine (NORVASC) 10 MG tablet Take 10 mg by mouth daily as needed (High BP).    Marland Kitchen aspirin EC 81 MG EC tablet Take 1 tablet (81 mg total) by mouth daily. 1 tablet 0  . atorvastatin (LIPITOR) 20 MG tablet Take 20 mg by mouth daily at 6 PM.     . clopidogrel (PLAVIX) 75 MG tablet TAKE 1 TABLET BY MOUTH DAILY 90 tablet 3  . colchicine 0.6 MG tablet Take 1 tablet (0.6 mg total) by mouth 2 (two) times daily. 60 tablet 1  . Cranberry 500 MG CAPS Take 1 capsule by mouth at bedtime.    . ferrous sulfate 325 (65 FE) MG tablet Take 325 mg by mouth every evening.     Marland Kitchen GLIPIZIDE XL 10 MG 24 hr tablet Take 10 mg by mouth 2 (two) times daily.  3  . hydrALAZINE (APRESOLINE) 50 MG tablet Take 50 mg by mouth 3 (three) times daily as needed (Will only take when blood pressure is running high. Will at least take once daily). 90 tablet 6  . insulin glargine (LANTUS) 100 UNIT/ML injection Inject 14 Units into the skin at bedtime.    . isosorbide mononitrate (IMDUR) 120 MG 24 hr tablet TAKE 1 TABLET BY  MOUTH EVERY DAY 30 tablet 5  . metoprolol (LOPRESSOR) 100 MG tablet Take 0.5 tablets (50 mg total) by mouth 2 (two) times daily. 90 tablet 0  . metoprolol (LOPRESSOR) 50 MG tablet TAKE 1 TABLET BY MOUTH TWICE DAILY 60 tablet 6  . Multiple Vitamin (MULTIVITAMIN WITH MINERALS) TABS tablet Take 1 tablet by mouth daily.    . nitroGLYCERIN (NITROSTAT) 0.4 MG SL tablet PLACE 1 TABLET UNDER THE TONGUE EVERY 5 MINUTES AS NEEDED FOR CHEST PAIN 25 tablet 1  . pantoprazole (PROTONIX) 40 MG tablet TAKE 1 TABLET BY MOUTH DAILY 90 tablet 3  . torsemide (DEMADEX) 20 MG tablet Take 2 tablets by mouth once a day 60 tablet 6   No current facility-administered medications for this visit.    Allergies:   Other    Social History:  The patient  reports that he quit smoking about 4 months ago. His smoking use included Cigarettes. He has a 100 pack-year smoking history. He has never used smokeless tobacco. He reports that he does not drink alcohol or use illicit drugs.   Family History:  The patient's family history includes CAD in his mother; Cirrhosis in his brother; Heart attack in his father; Heart disease in his father and mother; Hyperlipidemia in his father; Hypertension in his father and mother.    ROS:  Please see the history of present illness.   Otherwise, review of systems are positive for cough, blood in urine, difficulty urinating, chest soreness from coughing..   All other systems are reviewed and negative.    PHYSICAL EXAM: VS:  BP 136/50 mmHg  Pulse 84  Ht 5\' 6"  (1.676 m)  Wt 158 lb 6.4 oz (71.85 kg)  BMI 25.58 kg/m2  SpO2 97% , BMI Body mass index is 25.58 kg/(m^2). GEN: Well nourished, well developed, in no acute distress HEENT: normal Neck: no JVD, carotid bruits, or masses Cardiac: IRR.  There is no murmur, rub, or gallop. There is no edema. Respiratory:  clear to auscultation bilaterally, normal work of breathing. GI: soft, nontender, nondistended, + BS MS: no deformity or  atrophy  Skin: warm and dry, no rash Neuro:  Strength and sensation are intact Psych: euthymic mood, full affect   EKG:  EKG is not ordered today.    Recent Labs: 12/17/2015: ALT 25 12/19/2015: BUN 55*; Creatinine, Ser 2.18*; Hemoglobin 11.1*; Platelets 195; Potassium 4.7; Sodium 143    Lipid Panel    Component Value Date/Time   CHOL 133 09/03/2014 0403   TRIG 138 09/03/2014 0403   HDL 41 09/03/2014 0403   CHOLHDL 3.2 09/03/2014 0403   VLDL 28 09/03/2014 0403   LDLCALC 64 09/03/2014 0403      Wt Readings from Last 3 Encounters:  01/28/16 158 lb 6.4 oz (71.85 kg)  12/19/15 157 lb 3 oz (71.3 kg)  12/09/15 163 lb 3.2 oz (74.027 kg)      Other studies Reviewed: Additional studies/ records that were reviewed today include: Reviewed a recent nephrology notes from Dr Posey Pronto.. The findings include creatinine 1.53..    ASSESSMENT AND PLAN:  1. Coronary artery disease involving coronary bypass graft of native heart with other forms of angina pectoris ([place]) No significant episodes of angina.  2. Hypertensive heart disease with heart failure ([place]) Well controlled.  3. PAD (peripheral artery disease) ([facility]) Unchanged.  4. CKD (chronic kidney disease), stage III Based upon laboratory data from March, kidney function is slightly improved with creatinine of 1.53  5. Bilateral carotid artery occlusion No neurological complaints.  6. Essential hypertension Excellent BP control.  7. Hyperlipidemia Followed by primary care    Current medicines are reviewed at length with the patient today.  The patient has the following concerns regarding medicines: no change.  The following changes/actions have been instituted:    Six-month follow-up. Asked to call if orthopnea or lower extremity swelling.  No medication change  Labs/ tests ordered today include:  No orders of the defined types were placed in this encounter.     Disposition:   FU with HS in 6  months  Signed, Sinclair Grooms, MD  01/28/2016 9:41 AM    [facility] [address], [place], [place]  [address] Phone: [phone]; Fax: [phone]

## 2016-01-28 NOTE — Patient Instructions (Signed)
Medication Instructions:  Your physician recommends that you continue on your current medications as directed. Please refer to the Current Medication list given to you today.   Labwork: None ordered  Testing/Procedures: None ordered  Follow-Up: Your physician wants you to follow-up in: 6 months with Dr. Smith. You will receive a reminder letter in the mail two months in advance. If you don't receive a letter, please call our office to schedule the follow-up appointment.   Any Other Special Instructions Will Be Listed Below (If Applicable).     If you need a refill on your cardiac medications before your next appointment, please call your pharmacy.   

## 2016-01-29 DIAGNOSIS — C678 Malignant neoplasm of overlapping sites of bladder: Secondary | ICD-10-CM | POA: Diagnosis not present

## 2016-01-29 DIAGNOSIS — Z5111 Encounter for antineoplastic chemotherapy: Secondary | ICD-10-CM | POA: Diagnosis not present

## 2016-01-29 DIAGNOSIS — Z0000 Encounter for general adult medical examination without abnormal findings: Secondary | ICD-10-CM | POA: Diagnosis not present

## 2016-02-05 DIAGNOSIS — Z5111 Encounter for antineoplastic chemotherapy: Secondary | ICD-10-CM | POA: Diagnosis not present

## 2016-02-05 DIAGNOSIS — Z0000 Encounter for general adult medical examination without abnormal findings: Secondary | ICD-10-CM | POA: Diagnosis not present

## 2016-02-05 DIAGNOSIS — C678 Malignant neoplasm of overlapping sites of bladder: Secondary | ICD-10-CM | POA: Diagnosis not present

## 2016-02-09 DIAGNOSIS — H35073 Retinal telangiectasis, bilateral: Secondary | ICD-10-CM | POA: Diagnosis not present

## 2016-02-09 DIAGNOSIS — E119 Type 2 diabetes mellitus without complications: Secondary | ICD-10-CM | POA: Diagnosis not present

## 2016-02-09 DIAGNOSIS — H18332 Rupture in Descemet's membrane, left eye: Secondary | ICD-10-CM | POA: Diagnosis not present

## 2016-02-09 DIAGNOSIS — H4323 Crystalline deposits in vitreous body, bilateral: Secondary | ICD-10-CM | POA: Diagnosis not present

## 2016-02-12 DIAGNOSIS — Z5111 Encounter for antineoplastic chemotherapy: Secondary | ICD-10-CM | POA: Diagnosis not present

## 2016-02-12 DIAGNOSIS — Z0000 Encounter for general adult medical examination without abnormal findings: Secondary | ICD-10-CM | POA: Diagnosis not present

## 2016-02-12 DIAGNOSIS — C678 Malignant neoplasm of overlapping sites of bladder: Secondary | ICD-10-CM | POA: Diagnosis not present

## 2016-02-19 DIAGNOSIS — Z5111 Encounter for antineoplastic chemotherapy: Secondary | ICD-10-CM | POA: Diagnosis not present

## 2016-02-19 DIAGNOSIS — C678 Malignant neoplasm of overlapping sites of bladder: Secondary | ICD-10-CM | POA: Diagnosis not present

## 2016-02-19 DIAGNOSIS — Z0000 Encounter for general adult medical examination without abnormal findings: Secondary | ICD-10-CM | POA: Diagnosis not present

## 2016-02-20 ENCOUNTER — Encounter: Payer: Self-pay | Admitting: Vascular Surgery

## 2016-02-25 ENCOUNTER — Ambulatory Visit (HOSPITAL_COMMUNITY)
Admission: RE | Admit: 2016-02-25 | Discharge: 2016-02-25 | Disposition: A | Discharge status: Home / Self Care | Payer: Medicare Other | Source: Ambulatory Visit | Attending: Vascular Surgery | Admitting: Vascular Surgery

## 2016-02-25 ENCOUNTER — Encounter: Payer: Self-pay | Admitting: Vascular Surgery

## 2016-02-25 ENCOUNTER — Ambulatory Visit (INDEPENDENT_AMBULATORY_CARE_PROVIDER_SITE_OTHER): Payer: Medicare Other | Admitting: Vascular Surgery

## 2016-02-25 VITALS — BP 168/66 | HR 63 | Ht 66.0 in | Wt 161.9 lb

## 2016-02-25 DIAGNOSIS — Z48812 Encounter for surgical aftercare following surgery on the circulatory system: Secondary | ICD-10-CM | POA: Insufficient documentation

## 2016-02-25 DIAGNOSIS — N183 Chronic kidney disease, stage 3 (moderate): Secondary | ICD-10-CM | POA: Insufficient documentation

## 2016-02-25 DIAGNOSIS — I2510 Atherosclerotic heart disease of native coronary artery without angina pectoris: Secondary | ICD-10-CM | POA: Diagnosis not present

## 2016-02-25 DIAGNOSIS — E785 Hyperlipidemia, unspecified: Secondary | ICD-10-CM | POA: Diagnosis not present

## 2016-02-25 DIAGNOSIS — G4733 Obstructive sleep apnea (adult) (pediatric): Secondary | ICD-10-CM | POA: Diagnosis not present

## 2016-02-25 DIAGNOSIS — I130 Hypertensive heart and chronic kidney disease with heart failure and stage 1 through stage 4 chronic kidney disease, or unspecified chronic kidney disease: Secondary | ICD-10-CM | POA: Insufficient documentation

## 2016-02-25 DIAGNOSIS — K219 Gastro-esophageal reflux disease without esophagitis: Secondary | ICD-10-CM | POA: Diagnosis not present

## 2016-02-25 DIAGNOSIS — I708 Atherosclerosis of other arteries: Secondary | ICD-10-CM | POA: Diagnosis not present

## 2016-02-25 DIAGNOSIS — I5042 Chronic combined systolic (congestive) and diastolic (congestive) heart failure: Secondary | ICD-10-CM | POA: Diagnosis not present

## 2016-02-25 DIAGNOSIS — E1122 Type 2 diabetes mellitus with diabetic chronic kidney disease: Secondary | ICD-10-CM | POA: Diagnosis not present

## 2016-02-25 DIAGNOSIS — I6523 Occlusion and stenosis of bilateral carotid arteries: Secondary | ICD-10-CM

## 2016-02-25 DIAGNOSIS — I6521 Occlusion and stenosis of right carotid artery: Secondary | ICD-10-CM | POA: Diagnosis not present

## 2016-02-25 NOTE — Progress Notes (Signed)
HISTORY AND PHYSICAL     CC:  F/u carotid disease Referring Provider:  Shirline Frees, MD  HPI: This is a 75 y.o. male who had left carotid endarterectomy 01/30/15 by Dr. Scot Dock.  He has continued to do well and denies any stroke symptoms such as weakness or numbness of any extremity, difficulty with speech or temporary blindness.  He states he is going through his 2nd bout of treatments with bladder cancer.  About 1.5 months ago, he had surgery and had resumed his plavix.  He had urinary retention and presented to the ER.  At that time, he was also found to be having an MI due to blood loss from surgery and Plavix.  He is now back on his Plavix and doing well.  He does have some shortness of breath with activity and thinks he has a touch of COPD due to many years of smoking but has now quit.  He does have PAD and is followed by Dr. Fletcher Anon.  He denies any non healing wounds.   He is on insulin and glipizide for his diabetes.  He is on a beta blocker and CCB for hypertension.  He takes a statin for cholesterol management.  He is on a daily aspirin.   Past Medical History  Diagnosis Date  . Hyperlipidemia   . Hypertension   . GERD (gastroesophageal reflux disease)   . Esophageal stricture     a. History of dilitation - Long-standing history of esophageal reflux   . 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 1999: LIMA to LAD, sequential SVG to diagonal 2 and diagonal 3, sequential SVG to PDA, acute marginal branch, and PL branch. b. Repeat CABG 2015 with SVG to D1, SVG to PDA, and SVG to OM.   Marland Kitchen Chronic combined systolic and diastolic CHF (congestive heart failure) ([place])     a. Prior EF 45-50% in 08/2014. b. Improved EF >55% in 09/2014 at [facility].  . Type II diabetes mellitus ([place])   . Anemia     a. + FOBT 08/2014.  Marland Kitchen History of hiatal hernia   . Arthritis   . Chronic lower back pain   . CKD (chronic kidney disease) stage 3, GFR 30-59 ml/min   . Skin cancer   . Polymyalgia rheumatica ([place])   . SVT  (supraventricular tachycardia) ([place])   . Atrial fibrillation ([place])   . Carotid artery occlusion     a. Duplex 12/2013: mild plaque bilat, 40-59% BICA. F/u due 12/2014.  Marland Kitchen Atherosclerosis of native arteries of the extremities with intermittent claudication     a. L external iliac stent 2001, bilateral SFA occlusions documented 2001.  Marland Kitchen OSA (obstructive sleep apnea)     "don't wear mask"   . Hypotonic bladder     Requiring in and out catheterization performed by the patient at home - occasional blood tinged urine (self-cath's daily for the last 2 years) and hematuria is chronic for him, followed by urology.  . MI (myocardial infarction) ([place]) 1999-02/2014    total of 7 MI per patient  . COPD (chronic obstructive pulmonary disease) ([place])     pt states he doesn't have  . CAD (coronary artery disease), native coronary artery 09/24/2013    a. CABG x 6 1999. b. Interim PCIs: Angioplasty SVG-PDA 2006.Taxus DES to native LCx in 2007.DES to SVG-PDA 2007, restented 2009.NSTEMI with PCI to SVG- PDA complicated by no-reflow/inferior infarction in 2012. c. Redo CABG 2015 with SVG-PD, SVG-D1, SVG-OM1.  . History of  gout   . Bladder cancer ([place])   . Self-catheterizes urinary bladder   . Anginal pain ([place])     comes and goes; uses nitro to relieve   . Dysrhythmia   . Shortness of breath dyspnea   . History of bronchitis   . Daily headache     comes and goes pt denies daily currently   . Gait instability     Past Surgical History  Procedure Laterality Date  . Coronary stent placement      "I've had 8 put in; I presently have none" (08/26/2014)  . Cardiac catheterization    . Intraoperative transesophageal echocardiogram N/A 03/01/2014    Procedure: INTRAOPERATIVE TRANSESOPHAGEAL ECHOCARDIOGRAM;  Surgeon: Gaye Pollack, MD;  Location: [facility] OR;  Service: Open Heart Surgery;  Laterality: N/A;  . Anterior cervical decomp/discectomy fusion    . Ankle fracture surgery Right ~ 1977    "10 plates and 7  screws placed in it"  . Tonsillectomy    . Cataract extraction w/ intraocular lens  implant, bilateral Bilateral 2000's  . Mohs surgery  X 2    "nose; nose"  . Skin cancer excision      "all over my head; ears; back  . Esophagogastroduodenoscopy (egd) with esophageal dilation  X 1  . Coronary artery bypass graft  1999  . Coronary artery bypass graft N/A 03/01/2014    Procedure: REDO CORONARY ARTERY BYPASS GRAFTING TIMES THREE, USING RIGHT GREATER SAPHENOUS VEIN VIA ENDOVEIN HARVEST.;  Surgeon: Gaye Pollack, MD;  Location: [facility] OR;  Service: Open Heart Surgery;  Laterality: N/A;  . Left heart catheterization with coronary/graft angiogram  02/26/2014    Procedure: LEFT HEART CATHETERIZATION WITH Beatrix Fetters;  Surgeon: Sinclair Grooms, MD;  Location: [facility] CATH LAB;  Service: Cardiovascular;;  . Left heart catheterization with coronary/graft angiogram N/A 07/23/2014    Procedure: LEFT HEART CATHETERIZATION WITH Beatrix Fetters;  Surgeon: Sinclair Grooms, MD;  Location: [facility] CATH LAB;  Service: Cardiovascular;  Laterality: N/A;  . Left heart catheterization with coronary/graft angiogram N/A 08/30/2014    Procedure: LEFT HEART CATHETERIZATION WITH Beatrix Fetters;  Surgeon: Leonie Man, MD;  Location: [facility] CATH LAB;  Service: Cardiovascular;  Laterality: N/A;  . Abdominal aortagram N/A 11/27/2014    Procedure: ABDOMINAL Maxcine Ham;  Surgeon: Wellington Hampshire, MD;  Location: [facility] CATH LAB;  Service: Cardiovascular;  Laterality: N/A;  . Multiple tooth extractions    . Colonoscopy    . Endarterectomy Left 01/30/2015    Procedure: ENDARTERECTOMY CAROTID WITH PRIMARY CLOSURE OF ARTERY;  Surgeon: Angelia Mould, MD;  Location: [facility];  Service: Vascular;  Laterality: Left;  . Back surgery      neck surgery x 2  . Fracture surgery Right     ankle  . Cystoscopy w/ retrogrades Bilateral 03/03/2015    Procedure: CYSTOSCOPY WITH LEFT RETROGRADE PYELOGRAM;  Surgeon: Kathie Rhodes, MD;  Location: [facility] ORS;  Service: Urology;  Laterality: Bilateral;  . Transurethral resection of bladder tumor with gyrus (turbt-gyrus) N/A 03/03/2015    Procedure: TRANSURETHRAL RESECTION OF BLADDER TUMOR;  Surgeon: Kathie Rhodes, MD;  Location: [facility] ORS;  Service: Urology;  Laterality: N/A;  . Transurethral resection of bladder tumor with gyrus (turbt-gyrus) N/A 04/28/2015    Procedure: TRANSURETHRAL RESECTION OF BLADDER TUMOR ;  Surgeon: Kathie Rhodes, MD;  Location: [facility] ORS;  Service: Urology;  Laterality: N/A;  . Eye surgery      cataract surgery bilat   . Transurethral  resection of bladder tumor with gyrus (turbt-gyrus) N/A 11/24/2015    Procedure: TRANSURETHRAL RESECTION OF BLADDER TUMOR WITH GYRUS (TURBT-GYRUS);  Surgeon: Kathie Rhodes, MD;  Location: [facility] ORS;  Service: Urology;  Laterality: N/A;    Allergies  Allergen Reactions  . Other Itching and Nausea And Vomiting    The iv dye used in Fundus Flourescein Angiography procedure     Current Outpatient Prescriptions  Medication Sig Dispense Refill  . acetaminophen (TYLENOL) 500 MG tablet Take 1,000 mg by mouth every 6 (six) hours as needed for moderate pain.    Marland Kitchen amLODipine (NORVASC) 10 MG tablet Take 10 mg by mouth daily as needed (High BP).    Marland Kitchen aspirin EC 81 MG EC tablet Take 1 tablet (81 mg total) by mouth daily. 1 tablet 0  . atorvastatin (LIPITOR) 20 MG tablet Take 20 mg by mouth daily at 6 PM.     . clopidogrel (PLAVIX) 75 MG tablet TAKE 1 TABLET BY MOUTH DAILY 90 tablet 3  . Cranberry 500 MG CAPS Take 1 capsule by mouth at bedtime.    . ferrous sulfate 325 (65 FE) MG tablet Take 325 mg by mouth every evening.     Marland Kitchen GLIPIZIDE XL 10 MG 24 hr tablet Take 10 mg by mouth 2 (two) times daily.  3  . hydrALAZINE (APRESOLINE) 50 MG tablet Take 50 mg by mouth 3 (three) times daily as needed (Will only take when blood pressure is running high. Will at least take once daily). 90 tablet 6  . insulin glargine (LANTUS) 100 UNIT/ML  injection Inject 14 Units into the skin at bedtime.    . isosorbide mononitrate (IMDUR) 120 MG 24 hr tablet TAKE 1 TABLET BY MOUTH EVERY DAY 30 tablet 5  . metoprolol (LOPRESSOR) 100 MG tablet Take 0.5 tablets (50 mg total) by mouth 2 (two) times daily. 90 tablet 0  . metoprolol (LOPRESSOR) 50 MG tablet TAKE 1 TABLET BY MOUTH TWICE DAILY 60 tablet 6  . Multiple Vitamin (MULTIVITAMIN WITH MINERALS) TABS tablet Take 1 tablet by mouth daily.    . nitroGLYCERIN (NITROSTAT) 0.4 MG SL tablet PLACE 1 TABLET UNDER THE TONGUE EVERY 5 MINUTES AS NEEDED FOR CHEST PAIN 25 tablet 1  . pantoprazole (PROTONIX) 40 MG tablet TAKE 1 TABLET BY MOUTH DAILY 90 tablet 3  . torsemide (DEMADEX) 20 MG tablet Take 2 tablets by mouth once a day 60 tablet 6  . colchicine 0.6 MG tablet Take 1 tablet (0.6 mg total) by mouth 2 (two) times daily. (Patient not taking: Reported on 02/25/2016) 60 tablet 1   No current facility-administered medications for this visit.    Family History  Problem Relation Age of Onset  . CAD Mother   . Heart disease Mother   . Hypertension Mother   . Cirrhosis Brother   . Heart disease Father   . Hyperlipidemia Father   . Hypertension Father   . Heart attack Father     Social History   Social History  . Marital Status: Married    Spouse Name: N/A  . Number of Children: N/A  . Years of Education: N/A   Occupational History  . Not on file.   Social History Main Topics  . Smoking status: Former Smoker -- 2.00 packs/day for 50 years    Types: Cigarettes    Quit date: 09/18/2015  . Smokeless tobacco: Never Used  . 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 Drug Use: No  . Sexual Activity: Not on file   Other Topics Concern  . Not on file   Social History Narrative   Tour manager            REVIEW OF SYSTEMS:   [X]  denotes positive finding, [ ]  denotes negative finding Cardiac  Comments:  Chest pain or chest  pressure: x See HPI  Shortness of breath upon exertion: x See HPI  Short of breath when lying flat:    Irregular heart rhythm:        Vascular    Pain in calf, thigh, or hip brought on by ambulation: x   Pain in feet at night that wakes you up from your sleep:     Blood clot in your veins:    Leg swelling:         Pulmonary    Oxygen at home:    Productive cough:     Wheezing:         Neurologic    Sudden weakness in arms or legs:     Sudden numbness in arms or legs:     Sudden onset of difficulty speaking or slurred speech:    Temporary loss of vision in one eye:     Problems with dizziness:         Gastrointestinal    Blood in stool:     Vomited blood:         Genitourinary    Burning when urinating:     Recent bleeding after urologic surgery x   Blood in urine:        Psychiatric    Major depression:         Hematologic    Bleeding problems:    Problems with blood clotting too easily:        Skin    Rashes or ulcers:        Constitutional    Fever or chills:      PHYSICAL EXAMINATION:  Filed Vitals:   02/25/16 1553 02/25/16 1555  BP: 166/69 168/66  Pulse: 63    Body mass index is 26.14 kg/(m^2).  General:  WDWN in NAD; vital signs documented above Gait: Not observed HENT: WNL, normocephalic Pulmonary: normal non-labored breathing , without Rales, rhonchi,  wheezing Cardiac: regular HR, without  Murmurs, rubs or gallops; without carotid bruits Abdomen: soft, NT, no masses Skin: without rashes Vascular Exam/Pulses:  Right Left  Radial 2+ (normal) 2+ (normal)  DP Unable to palpate  Unable to palpate   PT Unable to palpate  Unable to palpate    Extremities: without ischemic changes, without Gangrene , without cellulitis; without open wounds;  Musculoskeletal: no muscle wasting or atrophy  Neurologic: A&O X 3;  No focal weakness or paresthesias are detected Psychiatric:  The pt has Normal affect.   Non-Invasive Vascular Imaging:   Carotid  duplex 02/25/16: -right with 60-79% focal stenosis ~ 4cm distal to the bifurcation -widely patent left CEA without evidnece of restenosis or hyperplasia -bilateral vertebral arteries are antegrade -left subclavian artery stenosis present  Pt meds includes: Statin:  Yes.   Beta Blocker:  Yes.   Aspirin:  Yes.   ACEI:  No. ARB:  No. Other Antiplatelet/Anticoagulant:  Yes.   Plavix   ASSESSMENT/PLAN:: 75 y.o. male with carotid artery stenosis s/p left CEA 01/30/15   -pt continues to be asymptomatic  -his left carotid endarterectomy site remains widely patent -he has had  some progression of the right carotid artery stenosis, which is focal.  We will see him back in 6 months for repeat duplex.  If he becomes symptomatic, he will call us or present to the ER -claudication-his PAD is followed by Dr. Fletcher Anon (of note, he has had vein harvest bilaterally for CABG).   Leontine Locket, PA-C [facility] [phone]  Clinic MD:  Pt seen and examined in conjunction with Dr. Scot Dock

## 2016-02-26 DIAGNOSIS — C678 Malignant neoplasm of overlapping sites of bladder: Secondary | ICD-10-CM | POA: Diagnosis not present

## 2016-02-26 DIAGNOSIS — Z0000 Encounter for general adult medical examination without abnormal findings: Secondary | ICD-10-CM | POA: Diagnosis not present

## 2016-02-26 DIAGNOSIS — Z5111 Encounter for antineoplastic chemotherapy: Secondary | ICD-10-CM | POA: Diagnosis not present

## 2016-03-02 DIAGNOSIS — N189 Chronic kidney disease, unspecified: Secondary | ICD-10-CM | POA: Diagnosis not present

## 2016-03-02 DIAGNOSIS — N2581 Secondary hyperparathyroidism of renal origin: Secondary | ICD-10-CM | POA: Diagnosis not present

## 2016-03-02 DIAGNOSIS — I10 Essential (primary) hypertension: Secondary | ICD-10-CM | POA: Diagnosis not present

## 2016-03-02 DIAGNOSIS — D631 Anemia in chronic kidney disease: Secondary | ICD-10-CM | POA: Diagnosis not present

## 2016-03-02 DIAGNOSIS — N183 Chronic kidney disease, stage 3 (moderate): Secondary | ICD-10-CM | POA: Diagnosis not present

## 2016-03-04 DIAGNOSIS — E875 Hyperkalemia: Secondary | ICD-10-CM | POA: Diagnosis not present

## 2016-03-12 DIAGNOSIS — E782 Mixed hyperlipidemia: Secondary | ICD-10-CM | POA: Diagnosis not present

## 2016-03-12 DIAGNOSIS — C679 Malignant neoplasm of bladder, unspecified: Secondary | ICD-10-CM | POA: Diagnosis not present

## 2016-03-12 DIAGNOSIS — N183 Chronic kidney disease, stage 3 (moderate): Secondary | ICD-10-CM | POA: Diagnosis not present

## 2016-03-12 DIAGNOSIS — I10 Essential (primary) hypertension: Secondary | ICD-10-CM | POA: Diagnosis not present

## 2016-03-12 DIAGNOSIS — E1165 Type 2 diabetes mellitus with hyperglycemia: Secondary | ICD-10-CM | POA: Diagnosis not present

## 2016-03-12 DIAGNOSIS — I2510 Atherosclerotic heart disease of native coronary artery without angina pectoris: Secondary | ICD-10-CM | POA: Diagnosis not present

## 2016-03-29 ENCOUNTER — Other Ambulatory Visit: Payer: Self-pay | Admitting: Interventional Cardiology

## 2016-03-31 DIAGNOSIS — Z5111 Encounter for antineoplastic chemotherapy: Secondary | ICD-10-CM | POA: Diagnosis not present

## 2016-03-31 DIAGNOSIS — N323 Diverticulum of bladder: Secondary | ICD-10-CM | POA: Diagnosis not present

## 2016-03-31 DIAGNOSIS — C678 Malignant neoplasm of overlapping sites of bladder: Secondary | ICD-10-CM | POA: Diagnosis not present

## 2016-03-31 DIAGNOSIS — R8271 Bacteriuria: Secondary | ICD-10-CM | POA: Diagnosis not present

## 2016-04-01 ENCOUNTER — Other Ambulatory Visit: Payer: Self-pay | Admitting: *Deleted

## 2016-04-01 MED ORDER — METOPROLOL TARTRATE 100 MG PO TABS: 50.0000 mg | ORAL_TABLET | Freq: Two times a day (BID) | ORAL | Status: DC

## 2016-04-07 DIAGNOSIS — C678 Malignant neoplasm of overlapping sites of bladder: Secondary | ICD-10-CM | POA: Diagnosis not present

## 2016-04-07 DIAGNOSIS — R8271 Bacteriuria: Secondary | ICD-10-CM | POA: Diagnosis not present

## 2016-04-07 DIAGNOSIS — Z5111 Encounter for antineoplastic chemotherapy: Secondary | ICD-10-CM | POA: Diagnosis not present

## 2016-04-21 DIAGNOSIS — Z5111 Encounter for antineoplastic chemotherapy: Secondary | ICD-10-CM | POA: Diagnosis not present

## 2016-04-21 DIAGNOSIS — C678 Malignant neoplasm of overlapping sites of bladder: Secondary | ICD-10-CM | POA: Diagnosis not present

## 2016-04-27 ENCOUNTER — Ambulatory Visit (INDEPENDENT_AMBULATORY_CARE_PROVIDER_SITE_OTHER): Payer: Medicare Other | Admitting: Cardiovascular Disease

## 2016-04-27 ENCOUNTER — Encounter: Payer: Self-pay | Admitting: Cardiovascular Disease

## 2016-04-27 VITALS — BP 154/65 | HR 57 | Ht 66.0 in | Wt 166.4 lb

## 2016-04-27 DIAGNOSIS — I739 Peripheral vascular disease, unspecified: Secondary | ICD-10-CM

## 2016-04-27 DIAGNOSIS — I6523 Occlusion and stenosis of bilateral carotid arteries: Secondary | ICD-10-CM | POA: Diagnosis not present

## 2016-04-27 NOTE — Patient Instructions (Addendum)
Medication Instructions:  Continue current medications  Labwork: NONE  Testing/Procedures: Your physician has requested that you have a lower extremity arterial duplex. This test is an ultrasound of the arteries in the legs or arms. It looks at arterial blood flow in the legs and arms. Allow one hour for Lower and Upper Arterial scans. There are no restrictions or special instructions  Your physician had requested that you have an Aorta Iliac Duplex.   Follow-Up: Your physician wants you to follow-up in: 1 Year. You will receive a reminder letter in the mail two months in advance. If you don't receive a letter, please call our office to schedule the follow-up appointment.   Any Other Special Instructions Will Be Listed Below (If Applicable).   If you need a refill on your cardiac medications before your next appointment, please call your pharmacy.

## 2016-04-27 NOTE — Progress Notes (Signed)
Cardiology Office Note   Date:  04/27/2016   ID:  [name], DOB 1941-10-13, MRN [id number]  PCP:  Shirline Frees, MD  Cardiologist:  Dr. Tamala Julian  Chief Complaint  Patient presents with  . Follow-up    LIGHTHEADED;occasionally. PAIN; in legs      History of Present Illness: [name] is a 75 y.o. male who presents for a follow up visit regarding  peripheral arterial disease. He has extensive medical problems that include CAD (CABG in 1999 with multiple PCIs since, repeat CABG 02/2014, HLD, OSA, CHF (EF 45-50% in 08/2014 with grade II DD, >55% by echo 09/2014 at [facility]), T2DM, COPD, OSA (doesn't wear mask), stage III-IV CKD, polymyalgia rheumatica, PVD including carotid stenosis  and anemia.  He has prolonged history of peripheral arterial disease with previous iliac PTA in 2001 done by Dr. Amedeo Plenty. He is known to have bilateral SFA occlusions.  He does have chronic kidney disease with Creatinine around 2.  Noninvasive evaluation n 2016 showed an ABI in 0.4 range bilaterally with chronic SFA occlusion and severe bilateral external iliac artery stenosis.  I proceeded with CO2 angiography in February, 2016 which showed: 1. Diffuse bilateral iliac disease. The only potential area of treatment is the left common iliac artery. However, there is diffuse disease throughout the whole iliac system with very small diameter. Thus, there is no good treatment options for endovascular intervention. 2. Chronically occluded SFAs.  He was treated medically for peripheral arterial disease. He had left carotid endarterectomy in 2016. He was subsequently diagnosed with bladder cancer and underwent 3 surgeries. He had complications related to hematuria and symptomatic anemia. Plavix was held at that time that he is back on dual antiplatelet therapy. He denies any chest pain or worsening dyspnea. His claudication continues to be stable and he is usually able to walk 2 blocks but usually with some mild  discomfort. He walks at a slower pace. There is no lower extremity ulceration.    Past Medical History  Diagnosis Date  . Hyperlipidemia   . Hypertension   . GERD (gastroesophageal reflux disease)   . Esophageal stricture     a. History of dilitation - Long-standing history of esophageal reflux   . 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 1999: LIMA to LAD, sequential SVG to diagonal 2 and diagonal 3, sequential SVG to PDA, acute marginal branch, and PL branch. b. Repeat CABG 2015 with SVG to D1, SVG to PDA, and SVG to OM.   Marland Kitchen Chronic combined systolic and diastolic CHF (congestive heart failure) ([place])     a. Prior EF 45-50% in 08/2014. b. Improved EF >55% in 09/2014 at [facility].  . Type II diabetes mellitus ([place])   . Anemia     a. + FOBT 08/2014.  Marland Kitchen History of hiatal hernia   . Arthritis   . Chronic lower back pain   . CKD (chronic kidney disease) stage 3, GFR 30-59 ml/min   . Skin cancer   . Polymyalgia rheumatica ([place])   . SVT (supraventricular tachycardia) ([place])   . Atrial fibrillation ([place])   . Carotid artery occlusion     a. Duplex 12/2013: mild plaque bilat, 40-59% BICA. F/u due 12/2014.  Marland Kitchen Atherosclerosis of native arteries of the extremities with intermittent claudication     a. L external iliac stent 2001, bilateral SFA occlusions documented 2001.  Marland Kitchen OSA (obstructive sleep apnea)     "don't wear mask"   . Hypotonic bladder  Requiring in and out catheterization performed by the patient at home - occasional blood tinged urine (self-cath's daily for the last 2 years) and hematuria is chronic for him, followed by urology.  . MI (myocardial infarction) ([place]) 1999-02/2014    total of 7 MI per patient  . COPD (chronic obstructive pulmonary disease) ([place])     pt states he doesn't have  . CAD (coronary artery disease), native coronary artery 09/24/2013    a. CABG x 6 1999. b. Interim PCIs: Angioplasty SVG-PDA 2006.Taxus DES to native LCx in 2007.DES to SVG-PDA 2007, restented 2009.NSTEMI with  PCI to SVG- PDA complicated by no-reflow/inferior infarction in 2012. c. Redo CABG 2015 with SVG-PD, SVG-D1, SVG-OM1.  . History of gout   . Bladder cancer ([place])   . Self-catheterizes urinary bladder   . Anginal pain ([place])     comes and goes; uses nitro to relieve   . Dysrhythmia   . Shortness of breath dyspnea   . History of bronchitis   . Daily headache     comes and goes pt denies daily currently   . Gait instability     Past Surgical History  Procedure Laterality Date  . Coronary stent placement      "I've had 8 put in; I presently have none" (08/26/2014)  . Cardiac catheterization    . Intraoperative transesophageal echocardiogram N/A 03/01/2014    Procedure: INTRAOPERATIVE TRANSESOPHAGEAL ECHOCARDIOGRAM;  Surgeon: Gaye Pollack, MD;  Location: [facility] OR;  Service: Open Heart Surgery;  Laterality: N/A;  . Anterior cervical decomp/discectomy fusion    . Ankle fracture surgery Right ~ 1977    "10 plates and 7 screws placed in it"  . Tonsillectomy    . Cataract extraction w/ intraocular lens  implant, bilateral Bilateral 2000's  . Mohs surgery  X 2    "nose; nose"  . Skin cancer excision      "all over my head; ears; back  . Esophagogastroduodenoscopy (egd) with esophageal dilation  X 1  . Coronary artery bypass graft  1999  . Coronary artery bypass graft N/A 03/01/2014    Procedure: REDO CORONARY ARTERY BYPASS GRAFTING TIMES THREE, USING RIGHT GREATER SAPHENOUS VEIN VIA ENDOVEIN HARVEST.;  Surgeon: Gaye Pollack, MD;  Location: [facility] OR;  Service: Open Heart Surgery;  Laterality: N/A;  . Left heart catheterization with coronary/graft angiogram  02/26/2014    Procedure: LEFT HEART CATHETERIZATION WITH Beatrix Fetters;  Surgeon: Sinclair Grooms, MD;  Location: [facility] CATH LAB;  Service: Cardiovascular;;  . Left heart catheterization with coronary/graft angiogram N/A 07/23/2014    Procedure: LEFT HEART CATHETERIZATION WITH Beatrix Fetters;  Surgeon: Sinclair Grooms, MD;   Location: [facility] CATH LAB;  Service: Cardiovascular;  Laterality: N/A;  . Left heart catheterization with coronary/graft angiogram N/A 08/30/2014    Procedure: LEFT HEART CATHETERIZATION WITH Beatrix Fetters;  Surgeon: Leonie Man, MD;  Location: [facility] CATH LAB;  Service: Cardiovascular;  Laterality: N/A;  . Abdominal aortagram N/A 11/27/2014    Procedure: ABDOMINAL Maxcine Ham;  Surgeon: Wellington Hampshire, MD;  Location: [place] CATH LAB;  Service: Cardiovascular;  Laterality: N/A;  . Multiple tooth extractions    . Colonoscopy    . Endarterectomy Left 01/30/2015    Procedure: ENDARTERECTOMY CAROTID WITH PRIMARY CLOSURE OF ARTERY;  Surgeon: Angelia Mould, MD;  Location: [place];  Service: Vascular;  Laterality: Left;  . Back surgery      neck surgery x 2  . Fracture surgery Right  ankle  . Cystoscopy w/ retrogrades Bilateral 03/03/2015    Procedure: CYSTOSCOPY WITH LEFT RETROGRADE PYELOGRAM;  Surgeon: Kathie Rhodes, MD;  Location: [facility] ORS;  Service: Urology;  Laterality: Bilateral;  . Transurethral resection of bladder tumor with gyrus (turbt-gyrus) N/A 03/03/2015    Procedure: TRANSURETHRAL RESECTION OF BLADDER TUMOR;  Surgeon: Kathie Rhodes, MD;  Location: [facility] ORS;  Service: Urology;  Laterality: N/A;  . Transurethral resection of bladder tumor with gyrus (turbt-gyrus) N/A 04/28/2015    Procedure: TRANSURETHRAL RESECTION OF BLADDER TUMOR ;  Surgeon: Kathie Rhodes, MD;  Location: [facility] ORS;  Service: Urology;  Laterality: N/A;  . Eye surgery      cataract surgery bilat   . Transurethral resection of bladder tumor with gyrus (turbt-gyrus) N/A 11/24/2015    Procedure: TRANSURETHRAL RESECTION OF BLADDER TUMOR WITH GYRUS (TURBT-GYRUS);  Surgeon: Kathie Rhodes, MD;  Location: [facility] ORS;  Service: Urology;  Laterality: N/A;     Current Outpatient Prescriptions  Medication Sig Dispense Refill  . acetaminophen (TYLENOL) 500 MG tablet Take 1,000 mg by mouth every 6 (six) hours as needed for moderate pain.     Marland Kitchen amLODipine (NORVASC) 10 MG tablet Take 10 mg by mouth daily as needed (High BP).    Marland Kitchen aspirin EC 81 MG EC tablet Take 1 tablet (81 mg total) by mouth daily. 1 tablet 0  . atorvastatin (LIPITOR) 20 MG tablet Take 20 mg by mouth daily at 6 PM.     . clopidogrel (PLAVIX) 75 MG tablet TAKE 1 TABLET BY MOUTH DAILY 90 tablet 3  . Cranberry 500 MG CAPS Take 1 capsule by mouth at bedtime.    . ferrous sulfate 325 (65 FE) MG tablet Take 325 mg by mouth every evening.     Marland Kitchen GLIPIZIDE XL 10 MG 24 hr tablet Take 10 mg by mouth 2 (two) times daily.  3  . hydrALAZINE (APRESOLINE) 50 MG tablet Take 50 mg by mouth 3 (three) times daily as needed (Will only take when blood pressure is running high. Will at least take once daily). 90 tablet 6  . insulin glargine (LANTUS) 100 UNIT/ML injection Inject 14 Units into the skin at bedtime.    . isosorbide mononitrate (IMDUR) 120 MG 24 hr tablet TAKE 1 TABLET BY MOUTH EVERY DAY 30 tablet 9  . metoprolol (LOPRESSOR) 100 MG tablet Take 0.5 tablets (50 mg total) by mouth 2 (two) times daily. 90 tablet 3  . Multiple Vitamin (MULTIVITAMIN WITH MINERALS) TABS tablet Take 1 tablet by mouth daily.    . nitroGLYCERIN (NITROSTAT) 0.4 MG SL tablet PLACE 1 TABLET UNDER THE TONGUE EVERY 5 MINUTES AS NEEDED FOR CHEST PAIN 25 tablet 1  . pantoprazole (PROTONIX) 40 MG tablet TAKE 1 TABLET BY MOUTH DAILY 90 tablet 3  . torsemide (DEMADEX) 20 MG tablet Take 2 tablets by mouth once a day 60 tablet 6   No current facility-administered medications for this visit.    Allergies:   Other    Social History:  The patient  reports that he quit smoking about 7 months ago. His smoking use included Cigarettes. He has a 100 pack-year smoking history. He has never used smokeless tobacco. He reports that he does not drink alcohol or use illicit drugs.   Family History:  The patient's family history includes CAD in his mother; Cirrhosis in his brother; Heart attack in his father; Heart disease in  his father and mother; Hyperlipidemia in his father; Hypertension in his father and mother.  ROS:  Please see the history of present illness.   Otherwise, review of systems are positive for none.   All other systems are reviewed and negative.    PHYSICAL EXAM: VS:  BP 154/65 mmHg  Pulse 57  Ht 5\' 6"  (1.676 m)  Wt 166 lb 6.4 oz (75.479 kg)  BMI 26.87 kg/m2 , BMI Body mass index is 26.87 kg/(m^2). GEN: Well nourished, well developed, in no acute distress HEENT: normal Neck: no JVD, carotid bruits, or masses Cardiac: RRR; no murmurs, rubs, or gallops,no edema  Respiratory:  clear to auscultation bilaterally, normal work of breathing GI: soft, nontender, nondistended, + BS MS: no deformity or atrophy Skin: warm and dry, no rash Neuro:  Strength and sensation are intact Psych: euthymic mood, full affect Vascular: Femoral pulses are very diminished bilaterally. Distal pulses are not palpable.   EKG:  EKG is not ordered today.    Recent Labs: 12/17/2015: ALT 25 12/19/2015: BUN 55*; Creatinine, Ser 2.18*; Hemoglobin 11.1*; Platelets 195; Potassium 4.7; Sodium 143    Lipid Panel    Component Value Date/Time   CHOL 133 09/03/2014 0403   TRIG 138 09/03/2014 0403   HDL 41 09/03/2014 0403   CHOLHDL 3.2 09/03/2014 0403   VLDL 28 09/03/2014 0403   LDLCALC 64 09/03/2014 0403      Wt Readings from Last 3 Encounters:  04/27/16 166 lb 6.4 oz (75.479 kg)  02/25/16 161 lb 14.4 oz (73.437 kg)  01/28/16 158 lb 6.4 oz (71.85 kg)        ASSESSMENT AND PLAN:  1.  Peripheral arterial disease with severe bilateral leg claudication. His symptoms have been stable. He is known to have diffuse bilateral iliac disease and occluded SFA bilaterally. Endovascular intervention is limited by chronic kidney disease. The patient does not want to take any chances with worsening renal function. I did review the angiogram with him today and explained to him the diffuse nature of his iliac disease which  would require multiple stents if endovascular intervention is to be attempted. I'm going to start with a lower extension arterial Doppler an aortoiliac duplex to compare with 2016 before making any decisions but most likely would continue medical therapy for now and reserving revascularization to worsening ischemia.  2. Coronary artery disease involving native coronary arteries without angina: Continue medical therapy. He is back on dual antiplatelet therapy.  3. Essential hypertension: Blood pressure is reasonably controlled.  4. Hyperlipidemia: Most recent LDL was 64. Continue treatment with atorvastatin.    Disposition:   FU with me in 1 year  Signed,  Kathlyn Sacramento, MD  04/27/2016 8:43 AM    [place]

## 2016-04-29 ENCOUNTER — Other Ambulatory Visit: Payer: Self-pay | Admitting: Cardiovascular Disease

## 2016-04-29 DIAGNOSIS — I739 Peripheral vascular disease, unspecified: Secondary | ICD-10-CM

## 2016-05-10 ENCOUNTER — Ambulatory Visit (HOSPITAL_COMMUNITY)
Admission: RE | Admit: 2016-05-10 | Discharge: 2016-05-10 | Disposition: A | Discharge status: Home / Self Care | Payer: Medicare Other | Source: Ambulatory Visit | Attending: Cardiovascular Disease | Admitting: Cardiovascular Disease

## 2016-05-10 DIAGNOSIS — I70203 Unspecified atherosclerosis of native arteries of extremities, bilateral legs: Secondary | ICD-10-CM | POA: Diagnosis not present

## 2016-05-10 DIAGNOSIS — Z951 Presence of aortocoronary bypass graft: Secondary | ICD-10-CM | POA: Insufficient documentation

## 2016-05-10 DIAGNOSIS — I5042 Chronic combined systolic (congestive) and diastolic (congestive) heart failure: Secondary | ICD-10-CM | POA: Diagnosis not present

## 2016-05-10 DIAGNOSIS — J449 Chronic obstructive pulmonary disease, unspecified: Secondary | ICD-10-CM | POA: Insufficient documentation

## 2016-05-10 DIAGNOSIS — I252 Old myocardial infarction: Secondary | ICD-10-CM | POA: Diagnosis not present

## 2016-05-10 DIAGNOSIS — Z87891 Personal history of nicotine dependence: Secondary | ICD-10-CM | POA: Insufficient documentation

## 2016-05-10 DIAGNOSIS — N289 Disorder of kidney and ureter, unspecified: Secondary | ICD-10-CM | POA: Diagnosis not present

## 2016-05-10 DIAGNOSIS — I2510 Atherosclerotic heart disease of native coronary artery without angina pectoris: Secondary | ICD-10-CM | POA: Insufficient documentation

## 2016-05-10 DIAGNOSIS — Z7982 Long term (current) use of aspirin: Secondary | ICD-10-CM | POA: Insufficient documentation

## 2016-05-10 DIAGNOSIS — M436 Torticollis: Secondary | ICD-10-CM | POA: Diagnosis not present

## 2016-05-10 DIAGNOSIS — I130 Hypertensive heart and chronic kidney disease with heart failure and stage 1 through stage 4 chronic kidney disease, or unspecified chronic kidney disease: Secondary | ICD-10-CM | POA: Diagnosis not present

## 2016-05-10 DIAGNOSIS — E1122 Type 2 diabetes mellitus with diabetic chronic kidney disease: Secondary | ICD-10-CM | POA: Diagnosis not present

## 2016-05-10 DIAGNOSIS — Z8551 Personal history of malignant neoplasm of bladder: Secondary | ICD-10-CM | POA: Diagnosis not present

## 2016-05-10 DIAGNOSIS — E785 Hyperlipidemia, unspecified: Secondary | ICD-10-CM | POA: Insufficient documentation

## 2016-05-10 DIAGNOSIS — I739 Peripheral vascular disease, unspecified: Secondary | ICD-10-CM

## 2016-05-10 DIAGNOSIS — I10 Essential (primary) hypertension: Secondary | ICD-10-CM | POA: Insufficient documentation

## 2016-05-10 DIAGNOSIS — Z85828 Personal history of other malignant neoplasm of skin: Secondary | ICD-10-CM | POA: Insufficient documentation

## 2016-05-10 DIAGNOSIS — E1165 Type 2 diabetes mellitus with hyperglycemia: Secondary | ICD-10-CM | POA: Insufficient documentation

## 2016-05-10 DIAGNOSIS — E1151 Type 2 diabetes mellitus with diabetic peripheral angiopathy without gangrene: Secondary | ICD-10-CM | POA: Insufficient documentation

## 2016-05-10 DIAGNOSIS — Z79899 Other long term (current) drug therapy: Secondary | ICD-10-CM | POA: Insufficient documentation

## 2016-05-10 DIAGNOSIS — N183 Chronic kidney disease, stage 3 (moderate): Secondary | ICD-10-CM | POA: Insufficient documentation

## 2016-05-10 DIAGNOSIS — Z794 Long term (current) use of insulin: Secondary | ICD-10-CM | POA: Diagnosis not present

## 2016-05-10 DIAGNOSIS — M25511 Pain in right shoulder: Secondary | ICD-10-CM | POA: Diagnosis present

## 2016-05-10 NOTE — ED Notes (Signed)
Pt called x2 for VS reassessment, no answer. Will try again

## 2016-05-10 NOTE — ED Triage Notes (Addendum)
Pain in the rt side of his head and neck for 1 week  achey pain,  Is a heart pt, states no numbness or tingling ,  No slurred speech  Has no weakness in limbs but when he lifts his legs it pulles his neck , no injury he states

## 2016-05-11 ENCOUNTER — Encounter (HOSPITAL_COMMUNITY): Payer: Self-pay | Admitting: Emergency Medicine

## 2016-05-11 ENCOUNTER — Emergency Department (HOSPITAL_COMMUNITY)
Admission: EM | Admit: 2016-05-11 | Discharge: 2016-05-11 | Disposition: A | Discharge status: Home / Self Care | Payer: Medicare Other | Attending: Emergency Medicine | Admitting: Emergency Medicine

## 2016-05-11 DIAGNOSIS — N289 Disorder of kidney and ureter, unspecified: Secondary | ICD-10-CM

## 2016-05-11 DIAGNOSIS — M436 Torticollis: Secondary | ICD-10-CM | POA: Diagnosis not present

## 2016-05-11 LAB — CBC WITH DIFFERENTIAL/PLATELET
Basophils Absolute: 0.1 10*3/uL (ref 0.0–0.1)
Basophils Relative: 1 %
EOS ABS: 0.2 10*3/uL (ref 0.0–0.7)
Eosinophils Relative: 2 %
HEMATOCRIT: 39.6 % (ref 39.0–52.0)
HEMOGLOBIN: 13.4 g/dL (ref 13.0–17.0)
LYMPHS ABS: 2.3 10*3/uL (ref 0.7–4.0)
Lymphocytes Relative: 21 %
MCH: 27.7 pg (ref 26.0–34.0)
MCHC: 33.8 g/dL (ref 30.0–36.0)
MCV: 82 fL (ref 78.0–100.0)
MONOS PCT: 9 %
Monocytes Absolute: 1 10*3/uL (ref 0.1–1.0)
NEUTROS PCT: 67 %
Neutro Abs: 7.3 10*3/uL (ref 1.7–7.7)
Platelets: 228 10*3/uL (ref 150–400)
RBC: 4.83 MIL/uL (ref 4.22–5.81)
RDW: 13.8 % (ref 11.5–15.5)
WBC: 10.7 10*3/uL — ABNORMAL HIGH (ref 4.0–10.5)

## 2016-05-11 LAB — BASIC METABOLIC PANEL
Anion gap: 10 (ref 5–15)
BUN: 52 mg/dL — AB (ref 6–20)
CHLORIDE: 102 mmol/L (ref 101–111)
CO2: 23 mmol/L (ref 22–32)
CREATININE: 2.07 mg/dL — AB (ref 0.61–1.24)
Calcium: 8.9 mg/dL (ref 8.9–10.3)
GFR calc Af Amer: 35 mL/min — ABNORMAL LOW (ref >60)
GFR calc non Af Amer: 30 mL/min — ABNORMAL LOW (ref >60)
Glucose, Bld: 246 mg/dL — ABNORMAL HIGH (ref 65–99)
Potassium: 4 mmol/L (ref 3.5–5.1)
SODIUM: 135 mmol/L (ref 135–145)

## 2016-05-11 LAB — TROPONIN I: Troponin I: <0.03 ng/mL (ref <0.03)

## 2016-05-11 MED ORDER — METHOCARBAMOL 1000 MG/10ML IJ SOLN
1000.0000 mg | Freq: Once | INTRAVENOUS | Status: AC
  Administered 2016-05-11: 1000 mg via INTRAVENOUS
  Filled 2016-05-11 (×2): qty 10

## 2016-05-11 MED ORDER — METHOCARBAMOL 1000 MG/10ML IJ SOLN: 1000.0000 mg | Freq: Once | INTRAMUSCULAR | Status: DC

## 2016-05-11 MED ORDER — MORPHINE SULFATE (PF) 4 MG/ML IV SOLN
4.0000 mg | Freq: Once | INTRAVENOUS | Status: AC
  Administered 2016-05-11: 4 mg via INTRAVENOUS
  Filled 2016-05-11: qty 1

## 2016-05-11 MED ORDER — OXYCODONE-ACETAMINOPHEN 5-325 MG PO TABS: 1.0000 | ORAL_TABLET | ORAL | 0 refills | Status: DC | PRN

## 2016-05-11 MED ORDER — TIZANIDINE HCL 4 MG PO TABS: 4.0000 mg | ORAL_TABLET | Freq: Four times a day (QID) | ORAL | 0 refills | Status: DC | PRN

## 2016-05-11 NOTE — ED Provider Notes (Signed)
[name] Provider Note   CSN: [id number] Arrival date & time: 05/10/16  [id number]  First Provider Contact:  First MD Initiated Contact with Patient 05/11/16 0102     By signing my name below, I, Altamease Oiler, attest that this documentation has been prepared under the direction and in the presence of Delora Fuel, MD. Electronically Signed: Altamease Oiler, ED Scribe. 05/11/16. 1:16 AM   History   Chief Complaint Chief Complaint  Patient presents with  . Shoulder Pain    HPI  The history is provided by the patient. No language interpreter was used.   [name] is a 75 y.o. male with PMHx of carotid artery disease, CAD s/p CABG, HTN, DM, and arthritis who presents to the Emergency Department complaining of constant, 4-5/10 in severity, aching/sore, right occipital pain with onset 6 days ago. The pain radiates to the right side of the neck and shoulder.  He had a doppler performed on his legs yesterday and the head pain worsened with positioning for the study. Turning the neck also exacerbates the pain. Tylenol provided insufficient relief in pain at home. He also took some of the medication that he uses for gout in his right ankle with no relief. Associated symptoms include dizziness. Pt denies numbness, tingling, and new weakness.   Past Medical History:  Diagnosis Date  . Anemia    a. + FOBT 08/2014.  Marland Kitchen Anginal pain ([place])    comes and goes; uses nitro to relieve   . Arthritis   . Atherosclerosis of native arteries of the extremities with intermittent claudication    a. L external iliac stent 2001, bilateral SFA occlusions documented 2001.  . Atrial fibrillation ([place])   . Bladder cancer ([place])   . CAD (coronary artery disease), native coronary artery 09/24/2013   a. CABG x 6 1999. b. Interim PCIs: Angioplasty SVG-PDA 2006.Taxus DES to native LCx in 2007.DES to SVG-PDA 2007, restented 2009.NSTEMI with PCI to SVG- PDA complicated by no-reflow/inferior infarction in  2012. c. Redo CABG 2015 with SVG-PD, SVG-D1, SVG-OM1.  . Carotid artery occlusion    a. Duplex 12/2013: mild plaque bilat, 40-59% BICA. F/u due 12/2014.  Marland Kitchen Chronic combined systolic and diastolic CHF (congestive heart failure) ([place])    a. Prior EF 45-50% in 08/2014. b. Improved EF >55% in 09/2014 at [facility]  . Chronic lower back pain   . CKD (chronic kidney disease) stage 3, GFR 30-59 ml/min   . COPD (chronic obstructive pulmonary disease) ([place])    pt states he doesn't have  . Daily headache    comes and goes pt denies daily currently   . Dysrhythmia   . Esophageal stricture    a. History of dilitation - Long-standing history of esophageal reflux   . Gait instability   . GERD (gastroesophageal reflux disease)   . History of bronchitis   . History of gout   . History of hiatal hernia   . Hx of CABG 09/24/2013   a. 1999: LIMA to LAD, sequential SVG to diagonal 2 and diagonal 3, sequential SVG to PDA, acute marginal branch, and PL branch. b. Repeat CABG 2015 with SVG to D1, SVG to PDA, and SVG to OM.   Marland Kitchen Hyperlipidemia   . Hypertension   . Hypotonic bladder    Requiring in and out catheterization performed by the patient at home - occasional blood tinged urine (self-cath's daily for the last 2 years) and hematuria is chronic for him, followed by urology.  . MI (  myocardial infarction) ([place]) 1999-02/2014   total of 7 MI per patient  . OSA (obstructive sleep apnea)    "don't wear mask"   . Polymyalgia rheumatica ([place])   . Self-catheterizes urinary bladder   . Shortness of breath dyspnea   . Skin cancer   . SVT (supraventricular tachycardia) ([place])   . Type II diabetes mellitus [facility])     Patient Active Problem List   Diagnosis Date Noted  . Bladder hemorrhage   . Chest pain   . Demand ischemia ([place])   . Elevated troponin   . Hematuria 12/18/2015  . Acute blood loss anemia 12/18/2015  . Non-ST elevation MI (NSTEMI) ([place]) 12/18/2015  . ARF (acute renal failure) ([place]) 12/18/2015  .  Non-ST elevation myocardial infarction (NSTEMI), type 2 ([place]) 12/17/2015  . SVT (supraventricular tachycardia) ([place])   . Polymyalgia rheumatica ([place]) 08/30/2014  . Anemia 08/30/2014  . Hyperglycemia   . HTN (hypertension) 08/27/2014  . Chronic diastolic heart failure ([place]) 04/22/2014  . CKD (chronic kidney disease) stage 3, GFR 30-59 ml/min 02/26/2014  . CAD (coronary artery disease) of artery bypass graft 09/24/2013  . COPD (chronic obstructive pulmonary disease) ([place]) 09/24/2013  . Type 2 diabetes mellitus with vascular disease ([place]) 09/24/2013  . 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 Esophageal stricture 09/24/2013  . OSA (obstructive sleep apnea)   . Hypertensive heart disease   . Tobacco use disorder 07/30/2013  . Carotid artery disease ([place])   . Hyperlipidemia   . Atherosclerosis of native arteries of extremity with intermittent claudication [facility] [place])     Past Surgical History:  Procedure Laterality Date  . ABDOMINAL AORTAGRAM N/A 11/27/2014   Procedure: ABDOMINAL Maxcine Ham;  Surgeon: Wellington Hampshire, MD;  Location: [facility] CATH LAB;  Service: Cardiovascular;  Laterality: N/A;  . ANKLE FRACTURE SURGERY Right ~ 1977   "10 plates and 7 screws placed in it"  . ANTERIOR CERVICAL DECOMP/DISCECTOMY FUSION    . BACK SURGERY     neck surgery x 2  . CARDIAC CATHETERIZATION    . CATARACT EXTRACTION W/ INTRAOCULAR LENS  IMPLANT, BILATERAL Bilateral 2000's  . COLONOSCOPY    . CORONARY ARTERY BYPASS GRAFT  1999  . CORONARY ARTERY BYPASS GRAFT N/A 03/01/2014   Procedure: REDO CORONARY ARTERY BYPASS GRAFTING TIMES THREE, USING RIGHT GREATER SAPHENOUS VEIN VIA ENDOVEIN HARVEST.;  Surgeon: Gaye Pollack, MD;  Location: [facility] OR;  Service: Open Heart Surgery;  Laterality: N/A;  . CORONARY STENT PLACEMENT     "I've had 8 put in; I presently have none" (08/26/2014)  . CYSTOSCOPY W/ RETROGRADES Bilateral 03/03/2015   Procedure: CYSTOSCOPY WITH LEFT RETROGRADE PYELOGRAM;  Surgeon: Kathie Rhodes, MD;  Location: [facility]  ORS;  Service: Urology;  Laterality: Bilateral;  . ENDARTERECTOMY Left 01/30/2015   Procedure: ENDARTERECTOMY CAROTID WITH PRIMARY CLOSURE OF ARTERY;  Surgeon: Angelia Mould, MD;  Location: [facility] OR;  Service: Vascular;  Laterality: Left;  . ESOPHAGOGASTRODUODENOSCOPY (EGD) WITH ESOPHAGEAL DILATION  X 1  . EYE SURGERY     cataract surgery bilat   . FRACTURE SURGERY Right    ankle  . INTRAOPERATIVE TRANSESOPHAGEAL ECHOCARDIOGRAM N/A 03/01/2014   Procedure: INTRAOPERATIVE TRANSESOPHAGEAL ECHOCARDIOGRAM;  Surgeon: Gaye Pollack, MD;  Location: [facility] OR;  Service: Open Heart Surgery;  Laterality: N/A;  . LEFT HEART CATHETERIZATION WITH CORONARY/GRAFT ANGIOGRAM  02/26/2014   Procedure: LEFT HEART CATHETERIZATION WITH Beatrix Fetters;  Surgeon: Sinclair Grooms, MD;  Location: [facility] CATH LAB;  Service: Cardiovascular;;  . LEFT HEART CATHETERIZATION WITH  CORONARY/GRAFT ANGIOGRAM N/A 07/23/2014   Procedure: LEFT HEART CATHETERIZATION WITH Beatrix Fetters;  Surgeon: Sinclair Grooms, MD;  Location[facility] CATH LAB;  Service: Cardiovascular;  Laterality: N/A;  . LEFT HEART CATHETERIZATION WITH CORONARY/GRAFT ANGIOGRAM N/A 08/30/2014   Procedure: LEFT HEART CATHETERIZATION WITH Beatrix Fetters;  Surgeon: Leonie Man, MD;  Location: [facility] CATH LAB;  Service: Cardiovascular;  Laterality: N/A;  . MOHS SURGERY  X 2   "nose; nose"  . MULTIPLE TOOTH EXTRACTIONS    . SKIN CANCER EXCISION     "all over my head; ears; back  . TONSILLECTOMY    . TRANSURETHRAL RESECTION OF BLADDER TUMOR WITH GYRUS (TURBT-GYRUS) N/A 03/03/2015   Procedure: TRANSURETHRAL RESECTION OF BLADDER TUMOR;  Surgeon: Kathie Rhodes, MD;  Location: [facility] ORS;  Service: Urology;  Laterality: N/A;  . TRANSURETHRAL RESECTION OF BLADDER TUMOR WITH GYRUS (TURBT-GYRUS) N/A 04/28/2015   Procedure: TRANSURETHRAL RESECTION OF BLADDER TUMOR ;  Surgeon: Kathie Rhodes, MD;  Location: [facility] ORS;  Service: Urology;  Laterality: N/A;  .  TRANSURETHRAL RESECTION OF BLADDER TUMOR WITH GYRUS (TURBT-GYRUS) N/A 11/24/2015   Procedure: TRANSURETHRAL RESECTION OF BLADDER TUMOR WITH GYRUS (TURBT-GYRUS);  Surgeon: Kathie Rhodes, MD;  Location: [facility] ORS;  Service: Urology;  Laterality: N/A;       Home Medications    Prior to Admission medications   Medication Sig Start Date End Date Taking? Authorizing Provider  acetaminophen (TYLENOL) 500 MG tablet Take 1,000 mg by mouth every 6 (six) hours as needed for moderate pain.   Yes Historical Provider, MD  amLODipine (NORVASC) 10 MG tablet Take 10 mg by mouth every morning.    Yes Historical Provider, MD  aspirin EC 81 MG EC tablet Take 1 tablet (81 mg total) by mouth daily. 11/19/14  Yes Wellington Hampshire, MD  atorvastatin (LIPITOR) 20 MG tablet Take 20 mg by mouth daily at 6 PM.    Yes Historical Provider, MD  clopidogrel (PLAVIX) 75 MG tablet TAKE 1 TABLET BY MOUTH DAILY 12/24/15  Yes Belva Crome, MD  Cranberry 500 MG CAPS Take 1 capsule by mouth at bedtime.   Yes Historical Provider, MD  ferrous sulfate 325 (65 FE) MG tablet Take 325 mg by mouth every evening.    Yes Historical Provider, MD  GLIPIZIDE XL 10 MG 24 hr tablet Take 10 mg by mouth 2 (two) times daily. 12/09/15  Yes Historical Provider, MD  hydrALAZINE (APRESOLINE) 50 MG tablet Take 50 mg by mouth 3 (three) times daily as needed (Will only take when blood pressure is running high. Will at least take once daily). 11/21/15  Yes Belva Crome, MD  insulin glargine (LANTUS) 100 UNIT/ML injection Inject 14 Units into the skin every morning.    Yes Historical Provider, MD  isosorbide mononitrate (IMDUR) 120 MG 24 hr tablet TAKE 1 TABLET BY MOUTH EVERY DAY 03/30/16  Yes Belva Crome, MD  magnesium oxide (MAG-OX) 400 (241.3 Mg) MG tablet Take 400 mg by mouth daily.   Yes Historical Provider, MD  metoprolol (LOPRESSOR) 100 MG tablet Take 0.5 tablets (50 mg total) by mouth 2 (two) times daily. 04/01/16  Yes Wellington Hampshire, MD  nitroGLYCERIN  (NITROSTAT) 0.4 MG SL tablet PLACE 1 TABLET UNDER THE TONGUE EVERY 5 MINUTES AS NEEDED FOR CHEST PAIN 07/25/15  Yes Belva Crome, MD  pantoprazole (PROTONIX) 40 MG tablet TAKE 1 TABLET BY MOUTH DAILY 12/04/15  Yes Wellington Hampshire, MD  torsemide (DEMADEX) 20 MG tablet Take 2  tablets by mouth once a day 10/29/15  Yes Belva Crome, MD    Family History Family History  Problem Relation Age of Onset  . CAD Mother   . Heart disease Mother   . Hypertension Mother   . Cirrhosis Brother   . Heart disease Father   . Hyperlipidemia Father   . Hypertension Father   . Heart attack Father     Social History Social History  Substance Use Topics  . Smoking status: Former Smoker    Packs/day: 2.00    Years: 50.00    Types: Cigarettes    Quit date: 09/18/2015  . Smokeless tobacco: Never Used  . 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  Neurological: Positive for dizziness and headaches. Negative for weakness and numbness.  All other systems reviewed and are negative.    Physical Exam Updated Vital Signs BP (!) 177/51 (BP Location: Left Arm)   Pulse 63   Temp 97.9 F (36.6 C) (Oral)   Resp 14   SpO2 96%   Physical Exam  Constitutional: He is oriented to person, place, and time. He appears well-developed and well-nourished.  HENT:  Head: Normocephalic and atraumatic.  Eyes: EOM are normal. Pupils are equal, round, and reactive to light.  Neck: Normal range of motion. Neck supple. No JVD present.  Spasm and tenderness of the right paracervical muscles Pain on passive ROM of the neck   Cardiovascular: Normal rate, regular rhythm, normal heart sounds and intact distal pulses.   Pulmonary/Chest: Effort normal and breath sounds normal. He has no wheezes. He has no rales. He exhibits no tenderness.  Abdominal: Soft. He  exhibits no distension and no mass. There is no tenderness.  Musculoskeletal: Normal range of motion. He exhibits no edema.  Lymphadenopathy:    He has no cervical adenopathy.  Neurological: He is alert and oriented to person, place, and time. He displays normal reflexes. No cranial nerve deficit. Coordination normal.  Skin: Skin is warm and dry. No rash noted.  Psychiatric: He has a normal mood and affect. His behavior is normal. Judgment and thought content normal.  Nursing note and vitals reviewed.    ED Treatments / Results  Labs (all labs ordered are listed, but only abnormal results are displayed) Labs Reviewed  CBC WITH DIFFERENTIAL/PLATELET - Abnormal; Notable for the following:       Result Value   WBC 10.7 (*)    All other components within normal limits  BASIC METABOLIC PANEL - Abnormal; Notable for the following:    Glucose, Bld 246 (*)    BUN 52 (*)    Creatinine, Ser 2.07 (*)    GFR calc non Af Amer 30 (*)    GFR calc Af Amer 35 (*)    All other components within normal limits  TROPONIN I    EKG  EKG Interpretation  Date/Time:  Tuesday May 11 2016 01:06:56 EDT Ventricular Rate:  69 PR Interval:    QRS Duration: 148 QT Interval:  471 QTC Calculation: 505 R Axis:   -85 Text Interpretation:  Sinus arrhythmia Right bundle branch block Inferior infarct, old Abnormal lateral Q waves When compared with ECG of 12/17/2015, ST-t abnormality has improved Confirmed by [facility]  MD, Braxston Quinter ([id number]) on 05/11/2016 1:34:48 AM  Procedures Procedures (including critical care time)  Medications Ordered in ED Medications  morphine 4 MG/ML injection 4 mg (4 mg Intravenous Given 05/11/16 0157)  methocarbamol (ROBAXIN) 1,000 mg in dextrose 5 % 50 mL IVPB (0 mg Intravenous Stopped 05/11/16 0227)     Initial Impression / Assessment and Plan / ED Course  I have reviewed the triage vital signs and the nursing notes.  COORDINATION OF CARE: 1:12 AM Discussed treatment plan which  includes lab work, EKG, Robaxin, and morphine with pt at bedside and pt agreed to plan.  Pertinent labs results that were available during my care of the patient were reviewed by me and considered in my medical decision making (see chart for details).  Clinical Course    Neck pain with spasm of the paracervical muscles. This is clearly musculoskeletal. No evidence of neurologic injury. Old records are reviewed and he is being followed for carotid artery stenosis and peripheral artery disease as well as known history of coronary artery disease. This does not seem to be related to any of his vascular issues. He is given a dose of morphine and methocarbamol with significant improvement. He is discharged with prescriptions for tizanidine and oxycodone-acetaminophen, told to apply ice to the sore area. Follow-up with PCP in 3 days.  Final Clinical Impressions(s) / ED Diagnoses   Final diagnoses:  Torticollis, acute  Renal insufficiency    New Prescriptions New Prescriptions   No medications on file  I personally performed the services described in this documentation, which was scribed in my presence. The recorded information has been reviewed and is accurate.       Delora Fuel, MD [phone]

## 2016-05-11 NOTE — Discharge Instructions (Signed)
Apply ice to the sore area in your neck several times a day. Return if symptoms are getting worse.

## 2016-05-17 ENCOUNTER — Other Ambulatory Visit: Payer: Self-pay | Admitting: *Deleted

## 2016-05-17 DIAGNOSIS — I6523 Occlusion and stenosis of bilateral carotid arteries: Secondary | ICD-10-CM

## 2016-06-03 DIAGNOSIS — Z961 Presence of intraocular lens: Secondary | ICD-10-CM | POA: Diagnosis not present

## 2016-06-03 DIAGNOSIS — E119 Type 2 diabetes mellitus without complications: Secondary | ICD-10-CM | POA: Diagnosis not present

## 2016-06-03 DIAGNOSIS — H18332 Rupture in Descemet's membrane, left eye: Secondary | ICD-10-CM | POA: Diagnosis not present

## 2016-06-09 DIAGNOSIS — N183 Chronic kidney disease, stage 3 (moderate): Secondary | ICD-10-CM | POA: Diagnosis not present

## 2016-06-09 DIAGNOSIS — Z23 Encounter for immunization: Secondary | ICD-10-CM | POA: Diagnosis not present

## 2016-06-09 DIAGNOSIS — E1165 Type 2 diabetes mellitus with hyperglycemia: Secondary | ICD-10-CM | POA: Diagnosis not present

## 2016-06-09 DIAGNOSIS — C679 Malignant neoplasm of bladder, unspecified: Secondary | ICD-10-CM | POA: Diagnosis not present

## 2016-06-09 DIAGNOSIS — I5022 Chronic systolic (congestive) heart failure: Secondary | ICD-10-CM | POA: Diagnosis not present

## 2016-06-09 DIAGNOSIS — E782 Mixed hyperlipidemia: Secondary | ICD-10-CM | POA: Diagnosis not present

## 2016-06-09 DIAGNOSIS — I739 Peripheral vascular disease, unspecified: Secondary | ICD-10-CM | POA: Diagnosis not present

## 2016-06-09 DIAGNOSIS — I10 Essential (primary) hypertension: Secondary | ICD-10-CM | POA: Diagnosis not present

## 2016-06-15 ENCOUNTER — Other Ambulatory Visit: Payer: Self-pay | Admitting: Interventional Cardiology

## 2016-06-15 DIAGNOSIS — N183 Chronic kidney disease, stage 3 unspecified: Secondary | ICD-10-CM

## 2016-07-06 DIAGNOSIS — Z8551 Personal history of malignant neoplasm of bladder: Secondary | ICD-10-CM | POA: Diagnosis not present

## 2016-07-13 DIAGNOSIS — N189 Chronic kidney disease, unspecified: Secondary | ICD-10-CM | POA: Diagnosis not present

## 2016-07-13 DIAGNOSIS — N183 Chronic kidney disease, stage 3 (moderate): Secondary | ICD-10-CM | POA: Diagnosis not present

## 2016-07-13 DIAGNOSIS — N2581 Secondary hyperparathyroidism of renal origin: Secondary | ICD-10-CM | POA: Diagnosis not present

## 2016-07-21 DIAGNOSIS — I10 Essential (primary) hypertension: Secondary | ICD-10-CM | POA: Diagnosis not present

## 2016-07-21 DIAGNOSIS — N2581 Secondary hyperparathyroidism of renal origin: Secondary | ICD-10-CM | POA: Diagnosis not present

## 2016-07-21 DIAGNOSIS — D631 Anemia in chronic kidney disease: Secondary | ICD-10-CM | POA: Diagnosis not present

## 2016-07-21 DIAGNOSIS — N183 Chronic kidney disease, stage 3 (moderate): Secondary | ICD-10-CM | POA: Diagnosis not present

## 2016-08-01 DIAGNOSIS — I252 Old myocardial infarction: Secondary | ICD-10-CM | POA: Insufficient documentation

## 2016-08-02 ENCOUNTER — Ambulatory Visit (INDEPENDENT_AMBULATORY_CARE_PROVIDER_SITE_OTHER): Payer: Medicare Other | Admitting: Interventional Cardiology

## 2016-08-02 ENCOUNTER — Encounter: Payer: Self-pay | Admitting: Interventional Cardiology

## 2016-08-02 VITALS — BP 148/64 | HR 54 | Ht 66.0 in | Wt 163.0 lb

## 2016-08-02 DIAGNOSIS — I739 Peripheral vascular disease, unspecified: Secondary | ICD-10-CM

## 2016-08-02 DIAGNOSIS — E784 Other hyperlipidemia: Secondary | ICD-10-CM

## 2016-08-02 DIAGNOSIS — I5032 Chronic diastolic (congestive) heart failure: Secondary | ICD-10-CM | POA: Diagnosis not present

## 2016-08-02 DIAGNOSIS — F17200 Nicotine dependence, unspecified, uncomplicated: Secondary | ICD-10-CM

## 2016-08-02 DIAGNOSIS — E7849 Other hyperlipidemia: Secondary | ICD-10-CM

## 2016-08-02 DIAGNOSIS — I252 Old myocardial infarction: Secondary | ICD-10-CM

## 2016-08-02 DIAGNOSIS — I25708 Atherosclerosis of coronary artery bypass graft(s), unspecified, with other forms of angina pectoris: Secondary | ICD-10-CM | POA: Diagnosis not present

## 2016-08-02 DIAGNOSIS — N183 Chronic kidney disease, stage 3 unspecified: Secondary | ICD-10-CM

## 2016-08-02 DIAGNOSIS — I70213 Atherosclerosis of native arteries of extremities with intermittent claudication, bilateral legs: Secondary | ICD-10-CM

## 2016-08-02 DIAGNOSIS — I779 Disorder of arteries and arterioles, unspecified: Secondary | ICD-10-CM

## 2016-08-02 DIAGNOSIS — I10 Essential (primary) hypertension: Secondary | ICD-10-CM

## 2016-08-02 DIAGNOSIS — I6523 Occlusion and stenosis of bilateral carotid arteries: Secondary | ICD-10-CM

## 2016-08-02 DIAGNOSIS — J431 Panlobular emphysema: Secondary | ICD-10-CM

## 2016-08-02 NOTE — Patient Instructions (Signed)

## 2016-08-02 NOTE — Progress Notes (Signed)
Cardiology Office Note    Date:  08/02/2016   ID:  JEAN-PAUL KOSH, DOB 12/31/1940, MRN [id number]  PCP:  Shirline Frees, MD  Cardiologist: Sinclair Grooms, MD   Chief Complaint  Patient presents with  . Coronary Artery Disease    History of Present Illness:  [name] is a 75 y.o. male who presents for Complicated CAD history, CABG on 2 prior occasions, chronic combined systolic and diastolic heart failure, stage III-for chronic kidney disease, hypertension, hyperlipidemia, diabetes mellitus, atrial fibrillation, PAD, and chronic anticoagulation therapy.  He has chronic shortness of breath. Bilateral lower chimney claudication is improving with walking. He denies angina. He has not had palpitations or syncope. Is no peripheral edema. Overall he feels that he is doing relatively well.  Intermittent left lateral chest discomfort that comes in brief episodes, not cardiac.   Past Medical History:  Diagnosis Date  . Anemia    a. + FOBT 08/2014.  Marland Kitchen Anginal pain ([place])    comes and goes; uses nitro to relieve   . Arthritis   . Atherosclerosis of native arteries of the extremities with intermittent claudication    a. L external iliac stent 2001, bilateral SFA occlusions documented 2001.  . Atrial fibrillation ([place])   . Bladder cancer ([place])   . CAD (coronary artery disease), native coronary artery 09/24/2013   a. CABG x 6 1999. b. Interim PCIs: Angioplasty SVG-PDA 2006.Taxus DES to native LCx in 2007.DES to SVG-PDA 2007, restented 2009.NSTEMI with PCI to SVG- PDA complicated by no-reflow/inferior infarction in 2012. c. Redo CABG 2015 with SVG-PD, SVG-D1, SVG-OM1.  . Carotid artery occlusion    a. Duplex 12/2013: mild plaque bilat, 40-59% BICA. F/u due 12/2014.  Marland Kitchen Chronic combined systolic and diastolic CHF (congestive heart failure) ([place])    a. Prior EF 45-50% in 08/2014. b. Improved EF >55% in 09/2014 at [facility].  . Chronic lower back pain   . CKD (chronic kidney disease)  stage 3, GFR 30-59 ml/min   . COPD (chronic obstructive pulmonary disease) ([place])    pt states he doesn't have  . Daily headache    comes and goes pt denies daily currently   . Dysrhythmia   . Esophageal stricture    a. History of dilitation - Long-standing history of esophageal reflux   . Gait instability   . GERD (gastroesophageal reflux disease)   . History of bronchitis   . History of gout   . History of hiatal hernia   . Hx of CABG 09/24/2013   a. 1999: LIMA to LAD, sequential SVG to diagonal 2 and diagonal 3, sequential SVG to PDA, acute marginal branch, and PL branch. b. Repeat CABG 2015 with SVG to D1, SVG to PDA, and SVG to OM.   Marland Kitchen Hyperlipidemia   . Hypertension   . Hypotonic bladder    Requiring in and out catheterization performed by the patient at home - occasional blood tinged urine (self-cath's daily for the last 2 years) and hematuria is chronic for him, followed by urology.  . MI (myocardial infarction) 1999-02/2014   total of 7 MI per patient  . OSA (obstructive sleep apnea)    "don't wear mask"   . Polymyalgia rheumatica ([place])   . Self-catheterizes urinary bladder   . Shortness of breath dyspnea   . Skin cancer   . SVT (supraventricular tachycardia) ([place])   . Type II diabetes mellitus ([place])     Past Surgical History:  Procedure Laterality Date  .  ABDOMINAL AORTAGRAM N/A 11/27/2014   Procedure: ABDOMINAL Maxcine Ham;  Surgeon: Wellington Hampshire, MD;  Location: [place] CATH LAB;  Service: Cardiovascular;  Laterality: N/A;  . ANKLE FRACTURE SURGERY Right ~ 1977   "10 plates and 7 screws placed in it"  . ANTERIOR CERVICAL DECOMP/DISCECTOMY FUSION    . BACK SURGERY     neck surgery x 2  . CARDIAC CATHETERIZATION    . CATARACT EXTRACTION W/ INTRAOCULAR LENS  IMPLANT, BILATERAL Bilateral 2000's  . COLONOSCOPY    . CORONARY ARTERY BYPASS GRAFT  1999  . CORONARY ARTERY BYPASS GRAFT N/A 03/01/2014   Procedure: REDO CORONARY ARTERY BYPASS GRAFTING TIMES THREE, USING RIGHT  GREATER SAPHENOUS VEIN VIA ENDOVEIN HARVEST.;  Surgeon: Gaye Pollack, MD;  Location: [place] OR;  Service: Open Heart Surgery;  Laterality: N/A;  . CORONARY STENT PLACEMENT     "I've had 8 put in; I presently have none" (08/26/2014)  . CYSTOSCOPY W/ RETROGRADES Bilateral 03/03/2015   Procedure: CYSTOSCOPY WITH LEFT RETROGRADE PYELOGRAM;  Surgeon: Kathie Rhodes, MD;  [facility];  Service: Urology;  Laterality: Bilateral;  . ENDARTERECTOMY Left 01/30/2015   Procedure: ENDARTERECTOMY CAROTID WITH PRIMARY CLOSURE OF ARTERY;  Surgeon: Angelia Mould, MD;  Location[facility] OR;  Service: Vascular;  Laterality: Left;  . ESOPHAGOGASTRODUODENOSCOPY (EGD) WITH ESOPHAGEAL DILATION  X 1  . EYE SURGERY     cataract surgery bilat   . FRACTURE SURGERY Right    ankle  . INTRAOPERATIVE TRANSESOPHAGEAL ECHOCARDIOGRAM N/A 03/01/2014   Procedure: INTRAOPERATIVE TRANSESOPHAGEAL ECHOCARDIOGRAM;  Surgeon: Gaye Pollack, MD;  Location[facility] OR;  Service: Open Heart Surgery;  Laterality: N/A;  . LEFT HEART CATHETERIZATION WITH CORONARY/GRAFT ANGIOGRAM  02/26/2014   Procedure: LEFT HEART CATHETERIZATION WITH Beatrix Fetters;  Surgeon: Sinclair Grooms, MD;  Location: [facility] CATH LAB;  Service: Cardiovascular;;  . LEFT HEART CATHETERIZATION WITH CORONARY/GRAFT ANGIOGRAM N/A 07/23/2014   Procedure: LEFT HEART CATHETERIZATION WITH Beatrix Fetters;  Surgeon: Sinclair Grooms, MD;  Location: [facility] CATH LAB;  Service: Cardiovascular;  Laterality: N/A;  . LEFT HEART CATHETERIZATION WITH CORONARY/GRAFT ANGIOGRAM N/A 08/30/2014   Procedure: LEFT HEART CATHETERIZATION WITH Beatrix Fetters;  Surgeon: Leonie Man, MD;  Location: [facility] C[facility] LAB;  Service: Cardiovascular;  Laterality: N/A;  . MOHS SURGERY  X 2   "nose; nose"  . MULTIPLE TOOTH EXTRACTIONS    . SKIN CANCER EXCISION     "all over my head; ears; back  . TONSILLECTOMY    . TRANSURETHRAL RESECTION OF BLADDER TUMOR WITH GYRUS (TURBT-GYRUS) N/A  03/03/2015   Procedure: TRANSURETHRAL RESECTION OF BLADDER TUMOR;  Surgeon: Kathie Rhodes, MD;  [facility];  Service: Urology;  Laterality: N/A;  . TRANSURETHRAL RESECTION OF BLADDER TUMOR WITH GYRUS (TURBT-GYRUS) N/A 04/28/2015   Procedure: TRANSURETHRAL RESECTION OF BLADDER TUMOR ;  Surgeon: Kathie Rhodes, MD;  [facility];  Service: Urology;  Laterality: N/A;  . TRANSURETHRAL RESECTION OF BLADDER TUMOR WITH GYRUS (TURBT-GYRUS) N/A 11/24/2015   Procedure: TRANSURETHRAL RESECTION OF BLADDER TUMOR WITH GYRUS (TURBT-GYRUS);  Surgeon: Kathie Rhodes, MD;  [facility];  Service: Urology;  Laterality: N/A;    Current Medications: Outpatient Medications Prior to Visit  Medication Sig Dispense Refill  . acetaminophen (TYLENOL) 500 MG tablet Take 1,000 mg by mouth every 6 (six) hours as needed for moderate pain.    Marland Kitchen amLODipine (NORVASC) 10 MG tablet Take 10 mg by mouth every morning.     Marland Kitchen aspirin EC 81 MG EC tablet  Take 1 tablet (81 mg total) by mouth daily. 1 tablet 0  . atorvastatin (LIPITOR) 20 MG tablet Take 20 mg by mouth daily at 6 PM.     . clopidogrel (PLAVIX) 75 MG tablet TAKE 1 TABLET BY MOUTH DAILY 90 tablet 3  . Cranberry 500 MG CAPS Take 1 capsule by mouth at bedtime.    . ferrous sulfate 325 (65 FE) MG tablet Take 325 mg by mouth every evening.     Marland Kitchen GLIPIZIDE XL 10 MG 24 hr tablet Take 10 mg by mouth 2 (two) times daily.  3  . hydrALAZINE (APRESOLINE) 50 MG tablet Take 50 mg by mouth 3 (three) times daily as needed (Will only take when blood pressure is running high. Will at least take once daily). 90 tablet 6  . insulin glargine (LANTUS) 100 UNIT/ML injection Inject 12 Units into the skin every morning.     . isosorbide mononitrate (IMDUR) 120 MG 24 hr tablet TAKE 1 TABLET BY MOUTH EVERY DAY 30 tablet 9  . magnesium oxide (MAG-OX) 400 (241.3 Mg) MG tablet Take 400 mg by mouth daily.    . metoprolol (LOPRESSOR) 100 MG tablet Take 0.5 tablets (50 mg total) by mouth 2 (two)  times daily. 90 tablet 3  . nitroGLYCERIN (NITROSTAT) 0.4 MG SL tablet PLACE 1 TABLET UNDER THE TONGUE EVERY 5 MINUTES AS NEEDED FOR CHEST PAIN 25 tablet 1  . pantoprazole (PROTONIX) 40 MG tablet TAKE 1 TABLET BY MOUTH DAILY 90 tablet 3  . torsemide (DEMADEX) 20 MG tablet TAKE 2 TABLETS BY MOUTH EVERY DAY 180 tablet 2  . oxyCODONE-acetaminophen (PERCOCET) 5-325 MG tablet Take 1 tablet by mouth every 4 (four) hours as needed for moderate pain. (Patient not taking: Reported on 08/02/2016) 10 tablet 0  . tiZANidine (ZANAFLEX) 4 MG tablet Take 1 tablet (4 mg total) by mouth every 6 (six) hours as needed for muscle spasms. (Patient not taking: Reported on 08/02/2016) 40 tablet 0   No facility-administered medications prior to visit.      Allergies:   Other   Social History   Social History  . Marital status: Married    Spouse name: N/A  . Number of children: N/A  . Years of education: N/A   Social History Main Topics  . Smoking status: Former Smoker    Packs/day: 2.00    Years: 50.00    Types: Cigarettes    Quit date: 09/18/2015  . Smokeless tobacco: Never Used  . 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 Drug use: No  . Sexual activity: Not Asked   Other Topics Concern  . None   Social History Narrative   Tour manager            Family History:  The patient's family history includes CAD in his mother; Cirrhosis in his brother; Heart attack in his father; Heart disease in his father and mother; Hyperlipidemia in his father; Hypertension in his father and mother.   ROS:   Please see the history of present illness.    He is bruising, irregular heartbeat occasionally. Excessive fatigue. Chronic kidney disease followed by Dr. Posey Pronto remains stable.  All other systems reviewed and are negative.   PHYSICAL EXAM:   VS:  BP (!) 148/64   Pulse (!) 54   Ht 5\' 6"  (1.676 m)   Wt 163 lb (73.9 kg)   BMI 26.31 kg/m  GEN:  Well nourished, well developed, in no acute distress  HEENT: normal  Neck: no JVD, carotid bruits, or masses Cardiac: RRR; no murmurs, rubs, or gallops,no edema  Respiratory:  clear to auscultation bilaterally, normal work of breathing GI: soft, nontender, nondistended, + BS MS: no deformity or atrophy  Skin: warm and dry, no rash Neuro:  Alert and Oriented x 3, Strength and sensation are intact Psych: euthymic mood, full affect  Wt Readings from Last 3 Encounters:  08/02/16 163 lb (73.9 kg)  05/11/16 164 lb (74.4 kg)  04/27/16 166 lb 6.4 oz (75.5 kg)      Studies/Labs Reviewed:   EKG:  EKG  Not repeated  Recent Labs: 12/17/2015: ALT 25 05/11/2016: BUN 52; Creatinine, Ser 2.07; Hemoglobin 13.4; Platelets 228; Potassium 4.0; Sodium 135   Lipid Panel    Component Value Date/Time   CHOL 133 09/03/2014 0403   TRIG 138 09/03/2014 0403   HDL 41 09/03/2014 0403   CHOLHDL 3.2 09/03/2014 0403   VLDL 28 09/03/2014 0403   LDLCALC 64 09/03/2014 0403    Additional studies/ records that were reviewed today include:  None     ASSESSMENT:    1. Coronary artery disease involving coronary bypass graft of native heart with other forms of angina pectoris ([place])   2. Chronic diastolic heart failure ([place])   3. Essential hypertension   4. Old MI (myocardial infarction)   5. Panlobular emphysema ([place])   6. Other hyperlipidemia   7. Tobacco use disorder   8. CKD (chronic kidney disease) stage 3, GFR 30-59 ml/min   9. Bilateral carotid artery disease ([place])   10. Atherosclerosis of native artery of both lower extremities with intermittent claudication ([facility])      PLAN:  In order of problems listed above:  1. Severe chronic coronary disease without symptomatic angina at this time. Six-month follow-up. 2. There is no evidence of volume overload. Diuretic regimen will remain the same. 3. The blood pressures well controlled for him. Target 140/90 or less 4. Not addressed 5. Chronic and  severe. Not smoking. 6. On statin therapy. No change in regimen. Will reevaluate in 6 months. 7. Not addressed as patient along or smokes. 8. Stable and followed by nephrology. Last creatinine 1.97. 9. No neurological complaints. Followed by vascular surgery. 10. Stable claudication with limitations in walking duration.    Medication Adjustments/Labs and Tests Ordered: Current medicines are reviewed at length with the patient today.  Concerns regarding medicines are outlined above.  Medication changes, Labs and Tests ordered today are listed in the Patient Instructions below. There are no Patient Instructions on file for this visit.   Signed, Sinclair Grooms, MD  08/02/2016 11:17 AM    [facility] [address], [place], [place]  [address] Phone: [phone]; Fax: [phone]

## 2016-08-04 DIAGNOSIS — J0100 Acute maxillary sinusitis, unspecified: Secondary | ICD-10-CM | POA: Diagnosis not present

## 2016-08-12 DIAGNOSIS — M109 Gout, unspecified: Secondary | ICD-10-CM | POA: Diagnosis not present

## 2016-08-23 DIAGNOSIS — J0100 Acute maxillary sinusitis, unspecified: Secondary | ICD-10-CM | POA: Diagnosis not present

## 2016-08-27 ENCOUNTER — Other Ambulatory Visit: Payer: Self-pay | Admitting: Interventional Cardiology

## 2016-08-27 ENCOUNTER — Encounter: Payer: Self-pay | Admitting: Vascular Surgery

## 2016-09-01 ENCOUNTER — Ambulatory Visit (INDEPENDENT_AMBULATORY_CARE_PROVIDER_SITE_OTHER): Payer: Medicare Other | Admitting: Vascular Surgery

## 2016-09-01 ENCOUNTER — Encounter: Payer: Self-pay | Admitting: Vascular Surgery

## 2016-09-01 ENCOUNTER — Ambulatory Visit (HOSPITAL_COMMUNITY)
Admission: RE | Admit: 2016-09-01 | Discharge: 2016-09-01 | Disposition: A | Discharge status: Home / Self Care | Payer: Medicare Other | Source: Ambulatory Visit | Attending: Vascular Surgery | Admitting: Vascular Surgery

## 2016-09-01 VITALS — BP 144/71 | HR 63 | Temp 97.2°F | Resp 20 | Ht 66.0 in | Wt 161.0 lb

## 2016-09-01 DIAGNOSIS — I6523 Occlusion and stenosis of bilateral carotid arteries: Secondary | ICD-10-CM

## 2016-09-01 DIAGNOSIS — I6522 Occlusion and stenosis of left carotid artery: Secondary | ICD-10-CM

## 2016-09-01 DIAGNOSIS — I6521 Occlusion and stenosis of right carotid artery: Secondary | ICD-10-CM | POA: Diagnosis not present

## 2016-09-01 LAB — VAS US CAROTID
LEFT ECA DIAS: -3 cm/s
LEFT VERTEBRAL DIAS: 6 cm/s
LICADSYS: -64 cm/s
Left CCA dist dias: -8 cm/s
Left CCA dist sys: -62 cm/s
Left CCA prox dias: 13 cm/s
Left CCA prox sys: 74 cm/s
Left ICA dist dias: -15 cm/s
Left ICA prox dias: -11 cm/s
Left ICA prox sys: -55 cm/s
RCCADSYS: -118 cm/s
RCCAPDIAS: 4 cm/s
RIGHT CCA MID DIAS: 11 cm/s
RIGHT ECA DIAS: -5 cm/s
RIGHT VERTEBRAL DIAS: 8 cm/s
Right CCA prox sys: 58 cm/s

## 2016-09-01 NOTE — Progress Notes (Signed)
History of Present Illness:  Patient is a 75 y.o. year old male who had left carotid endarterectomy 01/30/15 by Dr. Scot Dock. He presents today for follow up carotid duplex.   The patient denies symptoms of TIA, amaurosis, or stroke.  The patient is currently on aspirin and Plavix antiplatelet therapy.    Other medical problems include DM managed with PO medications and insulin, HTN managed with Metoprolol and hyperlipidemia managed with Lipitor.  He reports no change in his health and no medication changes since his last visit 6 month ago.    Past Medical History:  Diagnosis Date  . Anemia    a. + FOBT 08/2014.  Marland Kitchen Anginal pain ([place])    comes and goes; uses nitro to relieve   . Arthritis   . Atherosclerosis of native arteries of the extremities with intermittent claudication    a. L external iliac stent 2001, bilateral SFA occlusions documented 2001.  . Atrial fibrillation ([place])   . Bladder cancer ([place])   . CAD (coronary artery disease), native coronary artery 09/24/2013   a. CABG x 6 1999. b. Interim PCIs: Angioplasty SVG-PDA 2006.Taxus DES to native LCx in 2007.DES to SVG-PDA 2007, restented 2009.NSTEMI with PCI to SVG- PDA complicated by no-reflow/inferior infarction in 2012. c. Redo CABG 2015 with SVG-PD, SVG-D1, SVG-OM1.  . Carotid artery occlusion    a. Duplex 12/2013: mild plaque bilat, 40-59% BICA. F/u due 12/2014.  Marland Kitchen Chronic combined systolic and diastolic CHF (congestive heart failure) ([place])    a. Prior EF 45-50% in 08/2014. b. Improved EF >55% in 09/2014 at [facility].  . Chronic lower back pain   . CKD (chronic kidney disease) stage 3, GFR 30-59 ml/min   . COPD (chronic obstructive pulmonary disease) ([place])    pt states he doesn't have  . Daily headache    comes and goes pt denies daily currently   . Dysrhythmia   . Esophageal stricture    a. History of dilitation - Long-standing history of esophageal reflux   . Gait instability   . GERD (gastroesophageal reflux disease)     . History of bronchitis   . History of gout   . History of hiatal hernia   . Hx of CABG 09/24/2013   a. 1999: LIMA to LAD, sequential SVG to diagonal 2 and diagonal 3, sequential SVG to PDA, acute marginal branch, and PL branch. b. Repeat CABG 2015 with SVG to D1, SVG to PDA, and SVG to OM.   Marland Kitchen Hyperlipidemia   . Hypertension   . Hypotonic bladder    Requiring in and out catheterization performed by the patient at home - occasional blood tinged urine (self-cath's daily for the last 2 years) and hematuria is chronic for him, followed by urology.  . MI (myocardial infarction) 1999-02/2014   total of 7 MI per patient  . OSA (obstructive sleep apnea)    "don't wear mask"   . Polymyalgia rheumatica ([place])   . Self-catheterizes urinary bladder   . Shortness of breath dyspnea   . Skin cancer   . SVT (supraventricular tachycardia) ([place])   . Type II diabetes mellitus ([place])     Past Surgical History:  Procedure Laterality Date  . ABDOMINAL AORTAGRAM N/A 11/27/2014   Procedure: ABDOMINAL Maxcine Ham;  Surgeon: Wellington Hampshire, MD;  Location: [place] CATH LAB;  Service: Cardiovascular;  Laterality: N/A;  . ANKLE FRACTURE SURGERY Right ~ 1977   "10 plates and 7 screws placed in it"  . ANTERIOR  CERVICAL DECOMP/DISCECTOMY FUSION    . BACK SURGERY     neck surgery x 2  . CARDIAC CATHETERIZATION    . CATARACT EXTRACTION W/ INTRAOCULAR LENS  IMPLANT, BILATERAL Bilateral 2000's  . COLONOSCOPY    . CORONARY ARTERY BYPASS GRAFT  1999  . CORONARY ARTERY BYPASS GRAFT N/A 03/01/2014   Procedure: REDO CORONARY ARTERY BYPASS GRAFTING TIMES THREE, USING RIGHT GREATER SAPHENOUS VEIN VIA ENDOVEIN HARVEST.;  Surgeon: Gaye Pollack, MD;  Location: [facility] OR;  Service: Open Heart Surgery;  Laterality: N/A;  . CORONARY STENT PLACEMENT     "I've had 8 put in; I presently have none" (08/26/2014)  . CYSTOSCOPY W/ RETROGRADES Bilateral 03/03/2015   Procedure: CYSTOSCOPY WITH LEFT RETROGRADE PYELOGRAM;  Surgeon: Kathie Rhodes,  MD;  [facility];  Service: Urology;  Laterality: Bilateral;  . ENDARTERECTOMY Left 01/30/2015   Procedure: ENDARTERECTOMY CAROTID WITH PRIMARY CLOSURE OF ARTERY;  Surgeon: Angelia Mould, MD;  Location: [facility] OR;  Service: Vascular;  Laterality: Left;  . ESOPHAGOGASTRODUODENOSCOPY (EGD) WITH ESOPHAGEAL DILATION  X 1  . EYE SURGERY     cataract surgery bilat   . FRACTURE SURGERY Right    ankle  . INTRAOPERATIVE TRANSESOPHAGEAL ECHOCARDIOGRAM N/A 03/01/2014   Procedure: INTRAOPERATIVE TRANSESOPHAGEAL ECHOCARDIOGRAM;  Surgeon: Gaye Pollack, MD;  Location: [facility] OR;  Service: Open Heart Surgery;  Laterality: N/A;  . LEFT HEART CATHETERIZATION WITH CORONARY/GRAFT ANGIOGRAM  02/26/2014   Procedure: LEFT HEART CATHETERIZATION WITH Beatrix Fetters;  Surgeon: Sinclair Grooms, MD;  Location: [facility] CATH LAB;  Service: Cardiovascular;;  . LEFT HEART CATHETERIZATION WITH CORONARY/GRAFT ANGIOGRAM N/A 07/23/2014   Procedure: LEFT HEART CATHETERIZATION WITH Beatrix Fetters;  Surgeon: Sinclair Grooms, MD;  Location: [facility] CATH LAB;  Service: Cardiovascular;  Laterality: N/A;  . LEFT HEART CATHETERIZATION WITH CORONARY/GRAFT ANGIOGRAM N/A 08/30/2014   Procedure: LEFT HEART CATHETERIZATION WITH Beatrix Fetters;  Surgeon: Leonie Man, MD;  Location: [facility] CATH LAB;  Service: Cardiovascular;  Laterality: N/A;  . MOHS SURGERY  X 2   "nose; nose"  . MULTIPLE TOOTH EXTRACTIONS    . SKIN CANCER EXCISION     "all over my head; ears; back  . TONSILLECTOMY    . TRANSURETHRAL RESECTION OF BLADDER TUMOR WITH GYRUS (TURBT-GYRUS) N/A 03/03/2015   Procedure: TRANSURETHRAL RESECTION OF BLADDER TUMOR;  Surgeon: Kathie Rhodes, MD;  [facility];  Service: Urology;  Laterality: N/A;  . TRANSURETHRAL RESECTION OF BLADDER TUMOR WITH GYRUS (TURBT-GYRUS) N/A 04/28/2015   Procedure: TRANSURETHRAL RESECTION OF BLADDER TUMOR ;  Surgeon: Kathie Rhodes, MD;  [facility];  Service: Urology;   Laterality: N/A;  . TRANSURETHRAL RESECTION OF BLADDER TUMOR WITH GYRUS (TURBT-GYRUS) N/A 11/24/2015   Procedure: TRANSURETHRAL RESECTION OF BLADDER TUMOR WITH GYRUS (TURBT-GYRUS);  Surgeon: Kathie Rhodes, MD;  [facility];  Service: Urology;  Laterality: N/A;     Social History Social History  Substance Use Topics  . Smoking status: Former Smoker    Packs/day: 2.00    Years: 50.00    Types: Cigarettes    Quit date: 09/18/2015  . Smokeless tobacco: Never Used  . 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 CAD Mother   . Heart disease Mother   . Hypertension Mother   . Cirrhosis Brother   . Heart disease Father   . Hyperlipidemia  Father   . Hypertension Father   . Heart attack Father     Allergies  Allergies  Allergen Reactions  . Other Itching and Nausea And Vomiting    The iv dye used in Fundus Flourescein Angiography procedure      Current Outpatient Prescriptions  Medication Sig Dispense Refill  . acetaminophen (TYLENOL) 500 MG tablet Take 1,000 mg by mouth every 6 (six) hours as needed for moderate pain.    Marland Kitchen allopurinol (ZYLOPRIM) 100 MG tablet     . amLODipine (NORVASC) 10 MG tablet Take 10 mg by mouth every morning.     Marland Kitchen aspirin EC 81 MG EC tablet Take 1 tablet (81 mg total) by mouth daily. 1 tablet 0  . atorvastatin (LIPITOR) 20 MG tablet Take 20 mg by mouth daily at 6 PM.     . clopidogrel (PLAVIX) 75 MG tablet TAKE 1 TABLET BY MOUTH DAILY 90 tablet 3  . Cranberry 500 MG CAPS Take 1 capsule by mouth at bedtime.    . ferrous sulfate 325 (65 FE) MG tablet Take 325 mg by mouth every evening.     Marland Kitchen GLIPIZIDE XL 10 MG 24 hr tablet Take 10 mg by mouth 2 (two) times daily.  3  . hydrALAZINE (APRESOLINE) 50 MG tablet Take 50 mg by mouth 3 (three) times daily as needed (Will only take when blood pressure is running high. Will at least take once daily). 90  tablet 6  . insulin glargine (LANTUS) 100 UNIT/ML injection Inject 12 Units into the skin every morning.     . isosorbide mononitrate (IMDUR) 120 MG 24 hr tablet TAKE 1 TABLET BY MOUTH EVERY DAY 30 tablet 9  . magnesium oxide (MAG-OX) 400 (241.3 Mg) MG tablet Take 400 mg by mouth daily.    . metoprolol (LOPRESSOR) 100 MG tablet Take 0.5 tablets (50 mg total) by mouth 2 (two) times daily. 90 tablet 3  . nitroGLYCERIN (NITROSTAT) 0.4 MG SL tablet PLACE 1 TABLET UNDER THE TONGUE EVERY 5 MINUTES AS NEEDED FOR CHEST PAIN 25 tablet 5  . pantoprazole (PROTONIX) 40 MG tablet TAKE 1 TABLET BY MOUTH DAILY 90 tablet 3  . torsemide (DEMADEX) 20 MG tablet TAKE 2 TABLETS BY MOUTH EVERY DAY 180 tablet 2   No current facility-administered medications for this visit.     ROS:   General:  No weight loss, Fever, chills  HEENT: No recent headaches, no nasal bleeding, no visual changes, no sore throat  Neurologic: No dizziness, blackouts, seizures. No recent symptoms of stroke or mini- stroke. No recent episodes of slurred speech, or temporary blindness.  Cardiac: No recent episodes of chest pain/pressure, no shortness of breath at rest.  No shortness of breath with exertion.  Denies history of atrial fibrillation or irregular heartbeat  Vascular: No history of rest pain in feet.  positve history of claudication.  No history of non-healing ulcer, No history of DVT   Pulmonary: No home oxygen, no productive cough, no hemoptysis,  No asthma or wheezing  Musculoskeletal:  [x ] Arthritis, [ ]  Low back pain,  [ x] Joint pain  Hematologic:No history of hypercoagulable state.  No history of easy bleeding.  No history of anemia  Gastrointestinal: No hematochezia or melena,  No gastroesophageal reflux, no trouble swallowing  Urinary: [ ]  chronic Kidney disease, [ ]  on HD - [ ]  MWF or [ ]  TTHS, [ ]  Burning with urination, [ ]  Frequent urination, [ ]  Difficulty urinating;   Skin: No  rashes  Psychological: No  history of anxiety,  No history of depression   Physical Examination  Vitals:   09/01/16 1420 09/01/16 1428  BP: (!) 153/78 (!) 144/71  Pulse: 63   Resp: 20   Temp: 97.2 F (36.2 C)   TempSrc: Oral   SpO2: 96%   Weight: 161 lb (73 kg)   Height: 5\' 6"  (1.676 m)     Body mass index is 25.99 kg/m.  General:  Alert and oriented, no acute distress HEENT: Normal Neck: No bruit or JVD Pulmonary: Clear to auscultation bilaterally Cardiac: Regular Rate and Rhythm without murmur Gastrointestinal: Soft, non-tender, non-distended, no mass, no scars Skin: No rash Extremity Pulses:  2+ radial, brachial, femoral,  Non palpable dorsalis pedis, posterior tibial pulses bilaterally Musculoskeletal: No deformity or edema  Neurologic: Upper and lower extremity motor 5/5 and symmetric  DATA:  Carotid duplex  Right ICA 1-39% stenosis Left patent CEA without evidence of restenosis  ASSESSMENT:  Carotid stenosis   PLAN: F/U in 1 year with Vinnie Level Nickel following repeat carotid duplex.  We reviewed signs and symptoms of stroke today.     Theda Sers, EMMA MAUREEN PA-C [facility]  The patient was seen in conjunction with Dr. Scot Dock today.

## 2016-09-06 NOTE — Addendum Note (Signed)
Addended by: Mena Goes on: 09/06/2016 02:23 PM   Modules accepted: Orders

## 2016-09-08 DIAGNOSIS — M109 Gout, unspecified: Secondary | ICD-10-CM | POA: Diagnosis not present

## 2016-09-08 DIAGNOSIS — E1165 Type 2 diabetes mellitus with hyperglycemia: Secondary | ICD-10-CM | POA: Diagnosis not present

## 2016-09-08 DIAGNOSIS — E782 Mixed hyperlipidemia: Secondary | ICD-10-CM | POA: Diagnosis not present

## 2016-09-08 DIAGNOSIS — Z794 Long term (current) use of insulin: Secondary | ICD-10-CM | POA: Diagnosis not present

## 2016-09-08 DIAGNOSIS — I10 Essential (primary) hypertension: Secondary | ICD-10-CM | POA: Diagnosis not present

## 2016-09-08 DIAGNOSIS — N183 Chronic kidney disease, stage 3 (moderate): Secondary | ICD-10-CM | POA: Diagnosis not present

## 2016-09-08 DIAGNOSIS — Z7984 Long term (current) use of oral hypoglycemic drugs: Secondary | ICD-10-CM | POA: Diagnosis not present

## 2016-10-06 DIAGNOSIS — Z8551 Personal history of malignant neoplasm of bladder: Secondary | ICD-10-CM | POA: Diagnosis not present

## 2016-10-06 DIAGNOSIS — R300 Dysuria: Secondary | ICD-10-CM | POA: Diagnosis not present

## 2016-10-06 DIAGNOSIS — R8271 Bacteriuria: Secondary | ICD-10-CM | POA: Diagnosis not present

## 2016-10-13 DIAGNOSIS — Z8551 Personal history of malignant neoplasm of bladder: Secondary | ICD-10-CM | POA: Diagnosis not present

## 2016-10-13 DIAGNOSIS — C679 Malignant neoplasm of bladder, unspecified: Secondary | ICD-10-CM | POA: Diagnosis not present

## 2016-11-06 ENCOUNTER — Encounter (HOSPITAL_BASED_OUTPATIENT_CLINIC_OR_DEPARTMENT_OTHER): Payer: Self-pay | Admitting: Emergency Medicine

## 2016-11-06 ENCOUNTER — Emergency Department (HOSPITAL_BASED_OUTPATIENT_CLINIC_OR_DEPARTMENT_OTHER)
Admission: EM | Admit: 2016-11-06 | Discharge: 2016-11-06 | Disposition: A | Discharge status: Home / Self Care | Payer: Medicare Other | Attending: Emergency Medicine | Admitting: Emergency Medicine

## 2016-11-06 ENCOUNTER — Emergency Department (HOSPITAL_BASED_OUTPATIENT_CLINIC_OR_DEPARTMENT_OTHER): Payer: Medicare Other

## 2016-11-06 DIAGNOSIS — I130 Hypertensive heart and chronic kidney disease with heart failure and stage 1 through stage 4 chronic kidney disease, or unspecified chronic kidney disease: Secondary | ICD-10-CM | POA: Diagnosis not present

## 2016-11-06 DIAGNOSIS — J029 Acute pharyngitis, unspecified: Secondary | ICD-10-CM | POA: Insufficient documentation

## 2016-11-06 DIAGNOSIS — T7840XA Allergy, unspecified, initial encounter: Secondary | ICD-10-CM | POA: Diagnosis not present

## 2016-11-06 DIAGNOSIS — Z79899 Other long term (current) drug therapy: Secondary | ICD-10-CM | POA: Diagnosis not present

## 2016-11-06 DIAGNOSIS — J111 Influenza due to unidentified influenza virus with other respiratory manifestations: Secondary | ICD-10-CM | POA: Diagnosis not present

## 2016-11-06 DIAGNOSIS — L299 Pruritus, unspecified: Secondary | ICD-10-CM | POA: Diagnosis not present

## 2016-11-06 DIAGNOSIS — R69 Illness, unspecified: Secondary | ICD-10-CM

## 2016-11-06 DIAGNOSIS — I5042 Chronic combined systolic (congestive) and diastolic (congestive) heart failure: Secondary | ICD-10-CM | POA: Diagnosis not present

## 2016-11-06 DIAGNOSIS — Z7982 Long term (current) use of aspirin: Secondary | ICD-10-CM | POA: Insufficient documentation

## 2016-11-06 DIAGNOSIS — N183 Chronic kidney disease, stage 3 (moderate): Secondary | ICD-10-CM | POA: Diagnosis not present

## 2016-11-06 DIAGNOSIS — J989 Respiratory disorder, unspecified: Secondary | ICD-10-CM | POA: Diagnosis not present

## 2016-11-06 DIAGNOSIS — Z87891 Personal history of nicotine dependence: Secondary | ICD-10-CM | POA: Diagnosis not present

## 2016-11-06 DIAGNOSIS — J019 Acute sinusitis, unspecified: Secondary | ICD-10-CM | POA: Diagnosis not present

## 2016-11-06 DIAGNOSIS — Z794 Long term (current) use of insulin: Secondary | ICD-10-CM | POA: Insufficient documentation

## 2016-11-06 HISTORY — DX: Gout, unspecified: M10.9

## 2016-11-06 MED ORDER — OSELTAMIVIR PHOSPHATE 75 MG PO CAPS
75.0000 mg | ORAL_CAPSULE | Freq: Once | ORAL | Status: AC
  Administered 2016-11-06: 75 mg via ORAL
  Filled 2016-11-06: qty 1

## 2016-11-06 MED ORDER — EPINEPHRINE 0.3 MG/0.3ML IJ SOAJ: 0.3000 mg | Freq: Once | INTRAMUSCULAR | 0 refills | Status: AC

## 2016-11-06 MED ORDER — PREDNISONE 20 MG PO TABS
40.0000 mg | ORAL_TABLET | Freq: Once | ORAL | Status: AC
  Administered 2016-11-06: 40 mg via ORAL
  Filled 2016-11-06: qty 2

## 2016-11-06 MED ORDER — OSELTAMIVIR PHOSPHATE 75 MG PO CAPS: 75.0000 mg | ORAL_CAPSULE | Freq: Two times a day (BID) | ORAL | 0 refills | Status: DC

## 2016-11-06 NOTE — ED Provider Notes (Signed)
[place] DEPT [facility] Provider Note   CSN: [id number] Arrival date & time: 11/06/16  1711  By signing my name below, I, Judithe Modest, attest that this documentation has been prepared under the direction and in the presence of Malvin Johns, MD. Electronically Signed: Judithe Modest, ER Scribe. 05/29/2016. 5:44 PM.  History   Chief Complaint Chief Complaint  Patient presents with  . Allergic Reaction   The history is provided by the patient. No language interpreter was used.  Allergic Reaction  Presenting symptoms: rash     HPI Comments: [name] is a 76 y.o. male who presents to the Emergency Department complaining of an allergic reaction to Augmentin that started one hour ago. He was seen at urgent care earlier today due to persistent sinus congestion, rhinorrhea, fatigue, HA and sore throat that started 2 days ago, and was prescribed augmentin and prednisone. Shortly after taking the Augmentin he became itchy and developed a red rash all over his body with associated mild SOB. He has had severe SOB associted with CHF in the past, and this SOB is far less severe by comparison. He denies swelling in lips or tongue or trouble swallowing. He has had a reaction to an antibiotic in the past that consisted of severe diarrhea and vomiting but doesn't know the abx. He took a mucinex and tylenol at 7am this morning. He denies having a significant cough.   Past Medical History:  Diagnosis Date  . Anemia    a. + FOBT 08/2014.  Marland Kitchen Anginal pain ([place])    comes and goes; uses nitro to relieve   . Arthritis   . Atherosclerosis of native arteries of the extremities with intermittent claudication    a. L external iliac stent 2001, bilateral SFA occlusions documented 2001.  . Atrial fibrillation ([place])   . Bladder cancer ([place])   . CAD (coronary artery disease), native coronary artery 09/24/2013   a. CABG x 6 1999. b. Interim PCIs: Angioplasty SVG-PDA 2006.Taxus DES to native LCx in  2007.DES to SVG-PDA 2007, restented 2009.NSTEMI with PCI to SVG- PDA complicated by no-reflow/inferior infarction in 2012. c. Redo CABG 2015 with SVG-PD, SVG-D1, SVG-OM1.  . Carotid artery occlusion    a. Duplex 12/2013: mild plaque bilat, 40-59% BICA. F/u due 12/2014.  Marland Kitchen Chronic combined systolic and diastolic CHF (congestive heart failure) ([place])    a. Prior EF 45-50% in 08/2014. b. Improved EF >55% in 09/2014 at [facility].  . Chronic lower back pain   . CKD (chronic kidney disease) stage 3, GFR 30-59 ml/min   . COPD (chronic obstructive pulmonary disease) ([place])    pt states he doesn't have  . Daily headache    comes and goes pt denies daily currently   . Dysrhythmia   . Esophageal stricture    a. History of dilitation - Long-standing history of esophageal reflux   . Gait instability   . GERD (gastroesophageal reflux disease)   . Gout   . History of bronchitis   . History of gout   . History of hiatal hernia   . Hx of CABG 09/24/2013   a. 1999: LIMA to LAD, sequential SVG to diagonal 2 and diagonal 3, sequential SVG to PDA, acute marginal branch, and PL branch. b. Repeat CABG 2015 with SVG to D1, SVG to PDA, and SVG to OM.   Marland Kitchen Hyperlipidemia   . Hypertension   . Hypotonic bladder    Requiring in and out catheterization performed by the patient at  home - occasional blood tinged urine (self-cath's daily for the last 2 years) and hematuria is chronic for him, followed by urology.  . MI (myocardial infarction) 1999-02/2014   total of 7 MI per patient  . OSA (obstructive sleep apnea)    "don't wear mask"   . Polymyalgia rheumatica ([place])   . Self-catheterizes urinary bladder   . Shortness of breath dyspnea   . Skin cancer   . SVT (supraventricular tachycardia) ([place])   . Type II diabetes mellitus [facility])     Patient Active Problem List   Diagnosis Date Noted  . Old MI (myocardial infarction) 08/01/2016  . Demand ischemia ([place])   . Hematuria 12/18/2015  . SVT (supraventricular tachycardia)  ([place])   . Polymyalgia rheumatica ([place]) 08/30/2014  . Anemia 08/30/2014  . HTN (hypertension) 08/27/2014  . Chronic diastolic heart failure ([place]) 04/22/2014  . CKD (chronic kidney disease) stage 3, GFR 30-59 ml/min 02/26/2014  . CAD (coronary artery disease) of artery bypass graft 09/24/2013  . COPD (chronic obstructive pulmonary disease) ([place]) 09/24/2013  . Type 2 diabetes mellitus with vascular disease ([place]) 09/24/2013  . 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 Esophageal stricture 09/24/2013  . OSA (obstructive sleep apnea)   . Tobacco use disorder 07/30/2013  . Carotid artery disease ([place])   . Hyperlipidemia   . Atherosclerosis of native arteries of extremity with intermittent claudication [facility])     Past Surgical History:  Procedure Laterality Date  . ABDOMINAL AORTAGRAM N/A 11/27/2014   Procedure: ABDOMINAL Maxcine Ham;  Surgeon: Wellington Hampshire, MD;  Location: [facility] CATH LAB;  Service: Cardiovascular;  Laterality: N/A;  . ANKLE FRACTURE SURGERY Right ~ 1977   "10 plates and 7 screws placed in it"  . ANTERIOR CERVICAL DECOMP/DISCECTOMY FUSION    . BACK SURGERY     neck surgery x 2  . CARDIAC CATHETERIZATION    . CATARACT EXTRACTION W/ INTRAOCULAR LENS  IMPLANT, BILATERAL Bilateral 2000's  . COLONOSCOPY    . CORONARY ARTERY BYPASS GRAFT  1999  . CORONARY ARTERY BYPASS GRAFT N/A 03/01/2014   Procedure: REDO CORONARY ARTERY BYPASS GRAFTING TIMES THREE, USING RIGHT GREATER SAPHENOUS VEIN VIA ENDOVEIN HARVEST.;  Surgeon: Gaye Pollack, MD;  Location: [facility] OR;  Service: Open Heart Surgery;  Laterality: N/A;  . CORONARY STENT PLACEMENT     "I've had 8 put in; I presently have none" (08/26/2014)  . CYSTOSCOPY W/ RETROGRADES Bilateral 03/03/2015   Procedure: CYSTOSCOPY WITH LEFT RETROGRADE PYELOGRAM;  Surgeon: Kathie Rhodes, MD;  Location: [facility] ORS;  Service: Urology;  Laterality: Bilateral;  . ENDARTERECTOMY Left 01/30/2015   Procedure: ENDARTERECTOMY CAROTID WITH PRIMARY CLOSURE OF ARTERY;  Surgeon:  Angelia Mould, MD;  Location: [facility] OR;  Service: Vascular;  Laterality: Left;  . ESOPHAGOGASTRODUODENOSCOPY (EGD) WITH ESOPHAGEAL DILATION  X 1  . EYE SURGERY     cataract surgery bilat   . FRACTURE SURGERY Right    ankle  . INTRAOPERATIVE TRANSESOPHAGEAL ECHOCARDIOGRAM N/A 03/01/2014   Procedure: INTRAOPERATIVE TRANSESOPHAGEAL ECHOCARDIOGRAM;  Surgeon: Gaye Pollack, MD;  Location: [facility] OR;  Service: Open Heart Surgery;  Laterality: N/A;  . LEFT HEART CATHETERIZATION WITH CORONARY/GRAFT ANGIOGRAM  02/26/2014   Procedure: LEFT HEART CATHETERIZATION WITH Beatrix Fetters;  Surgeon: Sinclair Grooms, MD;  Location: [facility] CATH LAB;  Service: Cardiovascular;;  . LEFT HEART CATHETERIZATION WITH CORONARY/GRAFT ANGIOGRAM N/A 07/23/2014   Procedure: LEFT HEART CATHETERIZATION WITH Beatrix Fetters;  Surgeon: Sinclair Grooms, MD;  Location: [facility] CATH LAB;  Service: Cardiovascular;  Laterality: N/A;  . LEFT HEART CATHETERIZATION WITH CORONARY/GRAFT ANGIOGRAM N/A 08/30/2014   Procedure: LEFT HEART CATHETERIZATION WITH Beatrix Fetters;  Surgeon: Leonie Man, MD;  Location: [facility] CATH LAB;  Service: Cardiovascular;  Laterality: N/A;  . MOHS SURGERY  X 2   "nose; nose"  . MULTIPLE TOOTH EXTRACTIONS    . SKIN CANCER EXCISION     "all over my head; ears; back  . TONSILLECTOMY    . TRANSURETHRAL RESECTION OF BLADDER TUMOR WITH GYRUS (TURBT-GYRUS) N/A 03/03/2015   Procedure: TRANSURETHRAL RESECTION OF BLADDER TUMOR;  Surgeon: Kathie Rhodes, MD;  Location: [facility];  Service: Urology;  Laterality: N/A;  . TRANSURETHRAL RESECTION OF BLADDER TUMOR WITH GYRUS (TURBT-GYRUS) N/A 04/28/2015   Procedure: TRANSURETHRAL RESECTION OF BLADDER TUMOR ;  Surgeon: Kathie Rhodes, MD;  Location: [facility];  Service: Urology;  Laterality: N/A;  . TRANSURETHRAL RESECTION OF BLADDER TUMOR WITH GYRUS (TURBT-GYRUS) N/A 11/24/2015   Procedure: TRANSURETHRAL RESECTION OF BLADDER TUMOR WITH GYRUS (TURBT-GYRUS);   Surgeon: Kathie Rhodes, MD;  [facility];  Service: Urology;  Laterality: N/A;       Home Medications    Prior to Admission medications   Medication Sig Start Date End Date Taking? Authorizing Provider  allopurinol (ZYLOPRIM) 100 MG tablet  08/18/16  Yes Historical Provider, MD  amLODipine (NORVASC) 10 MG tablet Take 10 mg by mouth every morning.    Yes Historical Provider, MD  aspirin EC 81 MG EC tablet Take 1 tablet (81 mg total) by mouth daily. 11/19/14  Yes Wellington Hampshire, MD  atorvastatin (LIPITOR) 20 MG tablet Take 20 mg by mouth daily at 6 PM.    Yes Historical Provider, MD  clopidogrel (PLAVIX) 75 MG tablet TAKE 1 TABLET BY MOUTH DAILY 12/24/15  Yes Belva Crome, MD  ferrous sulfate 325 (65 FE) MG tablet Take 325 mg by mouth every evening.    Yes Historical Provider, MD  GLIPIZIDE XL 10 MG 24 hr tablet Take 10 mg by mouth 2 (two) times daily. 12/09/15  Yes Historical Provider, MD  hydrALAZINE (APRESOLINE) 50 MG tablet Take 50 mg by mouth 3 (three) times daily as needed (Will only take when blood pressure is running high. Will at least take once daily). Patient taking differently: 50 mg 2 (two) times daily. Take 50 mg by mouth 3 (three) times daily as needed (Will only take when blood pressure is running high. Will at least take once daily). 11/21/15  Yes Belva Crome, MD  insulin glargine (LANTUS) 100 UNIT/ML injection Inject 14 Units into the skin every morning.    Yes Historical Provider, MD  magnesium oxide (MAG-OX) 400 (241.3 Mg) MG tablet Take 400 mg by mouth daily.   Yes Historical Provider, MD  acetaminophen (TYLENOL) 500 MG tablet Take 1,000 mg by mouth every 6 (six) hours as needed for moderate pain.    Historical Provider, MD  Cranberry 500 MG CAPS Take 1 capsule by mouth at bedtime.    Historical Provider, MD  EPINEPHrine 0.3 mg/0.3 mL IJ SOAJ injection Inject 0.3 mLs (0.3 mg total) into the muscle once. 11/06/16 11/06/16  Malvin Johns, MD  isosorbide mononitrate (IMDUR) 120  MG 24 hr tablet TAKE 1 TABLET BY MOUTH EVERY DAY 03/30/16   Belva Crome, MD  metoprolol (LOPRESSOR) 100 MG tablet Take 0.5 tablets (50 mg total) by mouth 2 (two) times daily. 04/01/16   Wellington Hampshire, MD  nitroGLYCERIN (NITROSTAT) 0.4 MG SL tablet PLACE 1 TABLET UNDER THE  TONGUE EVERY 5 MINUTES AS NEEDED FOR CHEST PAIN 08/27/16   Belva Crome, MD  oseltamivir (TAMIFLU) 75 MG capsule Take 1 capsule (75 mg total) by mouth every 12 (twelve) hours. 11/06/16   Malvin Johns, MD  pantoprazole (PROTONIX) 40 MG tablet TAKE 1 TABLET BY MOUTH DAILY 12/04/15   Wellington Hampshire, MD  torsemide (DEMADEX) 20 MG tablet TAKE 2 TABLETS BY MOUTH EVERY DAY 06/15/16   Belva Crome, MD    Family History Family History  Problem Relation Age of Onset  . CAD Mother   . Heart disease Mother   . Hypertension Mother   . Cirrhosis Brother   . Heart disease Father   . Hyperlipidemia Father   . Hypertension Father   . Heart attack Father     Social History Social History  Substance Use Topics  . Smoking status: Former Smoker    Packs/day: 2.00    Years: 50.00    Types: Cigarettes    Quit date: 09/18/2015  . Smokeless tobacco: Never Used  . 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  HENT: Positive for congestion, rhinorrhea, sinus pain and sore throat. Negative for sneezing.   Eyes: Negative.   Respiratory: Positive for shortness of breath. Negative for cough and chest tightness.   Cardiovascular: Negative for chest pain and leg swelling.  Gastrointestinal: Negative for abdominal pain, blood in stool, diarrhea, nausea and vomiting.  Genitourinary: Negative for difficulty urinating, flank pain, frequency and hematuria.  Musculoskeletal: Negative for arthralgias and back pain.  Skin: Positive for rash.    Neurological: Positive for headaches. Negative for dizziness, speech difficulty, weakness and numbness.     Physical Exam Updated Vital Signs BP 150/56   Pulse 112   Temp 98.1 F (36.7 C) (Oral)   Resp 24   Ht 5\' 6"  (1.676 m)   Wt 160 lb (72.6 kg)   SpO2 98%   BMI 25.82 kg/m   Physical Exam  Constitutional: He is oriented to person, place, and time. He appears well-developed and well-nourished.  HENT:  Head: Normocephalic and atraumatic.  No angioedema  Eyes: Pupils are equal, round, and reactive to light.  Neck: Normal range of motion. Neck supple.  Cardiovascular: Normal rate, regular rhythm and normal heart sounds.   Pulmonary/Chest: Effort normal and breath sounds normal. No respiratory distress. He has no wheezes. He has no rales. He exhibits no tenderness.  Abdominal: Soft. Bowel sounds are normal. There is no tenderness. There is no rebound and no guarding.  Musculoskeletal: Normal range of motion. He exhibits no edema.  Lymphadenopathy:    He has no cervical adenopathy.  Neurological: He is alert and oriented to person, place, and time.  Skin: Skin is warm and dry. Rash (Mild erythema to the extremities and torso) noted.  Psychiatric: He has a normal mood and affect.     ED Treatments / Results  DIAGNOSTIC STUDIES: Oxygen Saturation is 98% on RA, normal by my interpretation.    COORDINATION OF CARE: 5:39 PM Discussed treatment plan with pt at bedside and pt agreed to plan.  Labs (all labs ordered are listed, but only abnormal results are displayed) Labs Reviewed - No data to display  EKG  EKG Interpretation None  Radiology Dg Chest 2 View  Result Date: 11/06/2016 CLINICAL DATA:  Allergic reaction, redness from head to toe. Respiratory difficulties. EXAM: CHEST  2 VIEW COMPARISON:  Chest radiograph December 18, 2015 FINDINGS: Cardiomediastinal silhouette is normal, status post median sternotomy for CABG. Coronary artery stent. Mild hyperinflation. No  pleural effusion or focal consolidation. No pneumothorax. ACDF. Surgical clips LEFT neck. Osseous structures are nonsuspicious. IMPRESSION: No active cardiopulmonary process, stable examination. Electronically Signed   By: Elon Alas M.D.   On: 11/06/2016 18:58    Procedures Procedures (including critical care time)  Medications Ordered in ED Medications  oseltamivir (TAMIFLU) capsule 75 mg (not administered)  predniSONE (DELTASONE) tablet 40 mg (40 mg Oral Given 11/06/16 1811)     Initial Impression / Assessment and Plan / ED Course  I have reviewed the triage vital signs and the nursing notes.  Pertinent labs & imaging results that were available during my care of the patient were reviewed by me and considered in my medical decision making (see chart for details).     Patient presents with allergic reaction that presumably is to Augmentin. The rash and itching started shortly after taking the Augmentin. He's been given Benadryl by EMS. He was also given a dose of prednisone in the ED. Heat RE taking 20 mg of prednisone prior to arrival and was given an additional 40 in the ED. He was monitored for a couple hours and had continuing improvement. His lungs are clear. He denies any shortness of breath. His heart rate has improved to the 90s.  He never had any angioedema. He is discharged home in good condition. He will continue his prednisone dose pack that was prescribed by urgent care. He will stop his Augmentin. His symptoms of only been going on for 2 days and I don't feel that he needs antibiotics. He does have flulike symptoms and I will go ahead and start Tamiflu however. He was discharged home in good condition. He was encouraged to have close follow-up with his PCP. Return precautions were given.  Final Clinical Impressions(s) / ED Diagnoses   Final diagnoses:  Allergic reaction, initial encounter  Influenza-like illness    New Prescriptions New Prescriptions   EPINEPHRINE  0.3 MG/0.3 ML IJ SOAJ INJECTION    Inject 0.3 mLs (0.3 mg total) into the muscle once.   OSELTAMIVIR (TAMIFLU) 75 MG CAPSULE    Take 1 capsule (75 mg total) by mouth every 12 (twelve) hours.     I personally performed the services described in this documentation, which was scribed in my presence.  The recorded information has been reviewed and considered.       Malvin Johns, MD 11/06/16 [phone]

## 2016-11-06 NOTE — ED Notes (Signed)
Pt able to stand and pivot from lowered stretcher to lowered hospital bed x1 min assist

## 2016-11-06 NOTE — ED Notes (Signed)
Patient transported to X-ray 

## 2016-11-06 NOTE — ED Triage Notes (Signed)
Was seen by Urgent Care today and started on Augmentin and Prednisone . Has had prednisone in the past without reaction. 1st time taking Augmentin.

## 2016-11-06 NOTE — ED Notes (Signed)
Pt on cardiac monitor and automatic VS 

## 2016-11-16 DIAGNOSIS — R350 Frequency of micturition: Secondary | ICD-10-CM | POA: Diagnosis not present

## 2016-11-16 DIAGNOSIS — M109 Gout, unspecified: Secondary | ICD-10-CM | POA: Diagnosis not present

## 2016-11-16 DIAGNOSIS — J209 Acute bronchitis, unspecified: Secondary | ICD-10-CM | POA: Diagnosis not present

## 2016-11-19 ENCOUNTER — Encounter: Payer: Self-pay | Admitting: Cardiology

## 2016-12-09 DIAGNOSIS — E1165 Type 2 diabetes mellitus with hyperglycemia: Secondary | ICD-10-CM | POA: Diagnosis not present

## 2016-12-09 DIAGNOSIS — I10 Essential (primary) hypertension: Secondary | ICD-10-CM | POA: Diagnosis not present

## 2016-12-09 DIAGNOSIS — I739 Peripheral vascular disease, unspecified: Secondary | ICD-10-CM | POA: Diagnosis not present

## 2016-12-09 DIAGNOSIS — N183 Chronic kidney disease, stage 3 (moderate): Secondary | ICD-10-CM | POA: Diagnosis not present

## 2016-12-09 DIAGNOSIS — E782 Mixed hyperlipidemia: Secondary | ICD-10-CM | POA: Diagnosis not present

## 2016-12-09 DIAGNOSIS — C679 Malignant neoplasm of bladder, unspecified: Secondary | ICD-10-CM | POA: Diagnosis not present

## 2016-12-09 DIAGNOSIS — M109 Gout, unspecified: Secondary | ICD-10-CM | POA: Diagnosis not present

## 2016-12-14 DIAGNOSIS — N189 Chronic kidney disease, unspecified: Secondary | ICD-10-CM | POA: Diagnosis not present

## 2016-12-14 DIAGNOSIS — N183 Chronic kidney disease, stage 3 (moderate): Secondary | ICD-10-CM | POA: Diagnosis not present

## 2016-12-14 DIAGNOSIS — N2581 Secondary hyperparathyroidism of renal origin: Secondary | ICD-10-CM | POA: Diagnosis not present

## 2016-12-20 DIAGNOSIS — I10 Essential (primary) hypertension: Secondary | ICD-10-CM | POA: Diagnosis not present

## 2016-12-20 DIAGNOSIS — N183 Chronic kidney disease, stage 3 (moderate): Secondary | ICD-10-CM | POA: Diagnosis not present

## 2016-12-20 DIAGNOSIS — D631 Anemia in chronic kidney disease: Secondary | ICD-10-CM | POA: Diagnosis not present

## 2016-12-20 DIAGNOSIS — N2581 Secondary hyperparathyroidism of renal origin: Secondary | ICD-10-CM | POA: Diagnosis not present

## 2016-12-22 ENCOUNTER — Other Ambulatory Visit: Payer: Self-pay | Admitting: Interventional Cardiology

## 2017-01-05 DIAGNOSIS — Z8551 Personal history of malignant neoplasm of bladder: Secondary | ICD-10-CM | POA: Diagnosis not present

## 2017-01-05 DIAGNOSIS — Z8546 Personal history of malignant neoplasm of prostate: Secondary | ICD-10-CM | POA: Diagnosis not present

## 2017-01-14 ENCOUNTER — Encounter: Payer: Self-pay | Admitting: *Deleted

## 2017-01-18 ENCOUNTER — Other Ambulatory Visit: Payer: Self-pay | Admitting: Cardiovascular Disease

## 2017-01-18 ENCOUNTER — Other Ambulatory Visit: Payer: Self-pay | Admitting: Interventional Cardiology

## 2017-01-30 NOTE — Progress Notes (Signed)
Cardiology Office Note    Date:  01/31/2017   ID:  [name], DOB Jul 19, 1941, MRN [id number]  PCP:  Shirline Frees, MD  Cardiologist: Sinclair Grooms, MD   Chief Complaint  Patient presents with  . Coronary Artery Disease  . Congestive Heart Failure    History of Present Illness:  [name] is a 76 y.o. male who presents for Complicated CAD history, CABG on 2 prior occasions, chronic combined systolic and diastolic heart failure, stage III-for chronic kidney disease, hypertension, hyperlipidemia, diabetes mellitus, atrial fibrillation, PAD, and chronic anticoagulation therapy  Doing well. No dyspnea or orthopnea. He has not had lower extremity swelling. He denies chest pain. No nitroglycerin use. He denies palpitations. No transient neurological complaints.  Past Medical History:  Diagnosis Date  . Anemia    a. + FOBT 08/2014.  Marland Kitchen Anginal pain ([place])    comes and goes; uses nitro to relieve   . Arthritis   . Atherosclerosis of native arteries of the extremities with intermittent claudication    a. L external iliac stent 2001, bilateral SFA occlusions documented 2001.  . Atrial fibrillation ([place])   . Bladder cancer ([place])   . CAD (coronary artery disease), native coronary artery 09/24/2013   a. CABG x 6 1999. b. Interim PCIs: Angioplasty SVG-PDA 2006.Taxus DES to native LCx in 2007.DES to SVG-PDA 2007, restented 2009.NSTEMI with PCI to SVG- PDA complicated by no-reflow/inferior infarction in 2012. c. Redo CABG 2015 with SVG-PD, SVG-D1, SVG-OM1.  . Carotid artery occlusion    a. Duplex 12/2013: mild plaque bilat, 40-59% BICA. F/u due 12/2014.  Marland Kitchen Chronic combined systolic and diastolic CHF (congestive heart failure) ([place])    a. Prior EF 45-50% in 08/2014. b. Improved EF >55% in 09/2014 at [facility].  . Chronic lower back pain   . CKD (chronic kidney disease) stage 3, GFR 30-59 ml/min   . COPD (chronic obstructive pulmonary disease) ([place])    pt states he doesn't have    . Daily headache    comes and goes pt denies daily currently   . Dysrhythmia   . Esophageal stricture    a. History of dilitation - Long-standing history of esophageal reflux   . Gait instability   . GERD (gastroesophageal reflux disease)   . Gout   . History of bronchitis   . History of gout   . History of hiatal hernia   . Hx of CABG 09/24/2013   a. 1999: LIMA to LAD, sequential SVG to diagonal 2 and diagonal 3, sequential SVG to PDA, acute marginal branch, and PL branch. b. Repeat CABG 2015 with SVG to D1, SVG to PDA, and SVG to OM.   Marland Kitchen Hyperlipidemia   . Hypertension   . Hypotonic bladder    Requiring in and out catheterization performed by the patient at home - occasional blood tinged urine (self-cath's daily for the last 2 years) and hematuria is chronic for him, followed by urology.  . MI (myocardial infarction) ([place]) 1999-02/2014   total of 7 MI per patient  . OSA (obstructive sleep apnea)    "don't wear mask"   . Polymyalgia rheumatica ([place])   . Self-catheterizes urinary bladder   . Shortness of breath dyspnea   . Skin cancer   . SVT (supraventricular tachycardia) ([place])   . Type II diabetes mellitus ([place])     Past Surgical History:  Procedure Laterality Date  . ABDOMINAL AORTAGRAM N/A 11/27/2014   Procedure: ABDOMINAL Maxcine Ham;  Surgeon: Rogue Jury  Ferne Reus, MD;  Location: [facility] CATH LAB;  Service: Cardiovascular;  Laterality: N/A;  . ANKLE FRACTURE SURGERY Right ~ 1977   "10 plates and 7 screws placed in it"  . ANTERIOR CERVICAL DECOMP/DISCECTOMY FUSION    . BACK SURGERY     neck surgery x 2  . CARDIAC CATHETERIZATION    . CATARACT EXTRACTION W/ INTRAOCULAR LENS  IMPLANT, BILATERAL Bilateral 2000's  . COLONOSCOPY    . CORONARY ARTERY BYPASS GRAFT  1999  . CORONARY ARTERY BYPASS GRAFT N/A 03/01/2014   Procedure: REDO CORONARY ARTERY BYPASS GRAFTING TIMES THREE, USING RIGHT GREATER SAPHENOUS VEIN VIA ENDOVEIN HARVEST.;  Surgeon: Gaye Pollack, MD;  Location: [facility] OR;   Service: Open Heart Surgery;  Laterality: N/A;  . CORONARY STENT PLACEMENT     "I've had 8 put in; I presently have none" (08/26/2014)  . CYSTOSCOPY W/ RETROGRADES Bilateral 03/03/2015   Procedure: CYSTOSCOPY WITH LEFT RETROGRADE PYELOGRAM;  Surgeon: Kathie Rhodes, MD;  Location: [facility];  Service: Urology;  Laterality: Bilateral;  . ENDARTERECTOMY Left 01/30/2015   Procedure: ENDARTERECTOMY CAROTID WITH PRIMARY CLOSURE OF ARTERY;  Surgeon: Angelia Mould, MD;  [facility];  Service: Vascular;  Laterality: Left;  . ESOPHAGOGASTRODUODENOSCOPY (EGD) WITH ESOPHAGEAL DILATION  X 1  . EYE SURGERY     cataract surgery bilat   . FRACTURE SURGERY Right    ankle  . INTRAOPERATIVE TRANSESOPHAGEAL ECHOCARDIOGRAM N/A 03/01/2014   Procedure: INTRAOPERATIVE TRANSESOPHAGEAL ECHOCARDIOGRAM;  Surgeon: Gaye Pollack, MD;  Location: [facility] OR;  Service: Open Heart Surgery;  Laterality: N/A;  . LEFT HEART CATHETERIZATION WITH CORONARY/GRAFT ANGIOGRAM  02/26/2014   Procedure: LEFT HEART CATHETERIZATION WITH Beatrix Fetters;  Surgeon: Sinclair Grooms, MD;  Location: [facility] CATH LAB;  Service: Cardiovascular;;  . LEFT HEART CATHETERIZATION WITH CORONARY/GRAFT ANGIOGRAM N/A 07/23/2014   Procedure: LEFT HEART CATHETERIZATION WITH Beatrix Fetters;  Surgeon: Sinclair Grooms, MD;  [facility] LAB;  Service: Cardiovascular;  Laterality: N/A;  . LEFT HEART CATHETERIZATION WITH CORONARY/GRAFT ANGIOGRAM N/A 08/30/2014   Procedure: LEFT HEART CATHETERIZATION WITH Beatrix Fetters;  Surgeon: Leonie Man, MD;  Location: [facility] CATH LAB;  Service: Cardiovascular;  Laterality: N/A;  . MOHS SURGERY  X 2   "nose; nose"  . MULTIPLE TOOTH EXTRACTIONS    . SKIN CANCER EXCISION     "all over my head; ears; back  . TONSILLECTOMY    . TRANSURETHRAL RESECTION OF BLADDER TUMOR WITH GYRUS (TURBT-GYRUS) N/A 03/03/2015   Procedure: TRANSURETHRAL RESECTION OF BLADDER TUMOR;  Surgeon: Kathie Rhodes, MD;   Location: [facility];  Service: Urology;  Laterality: N/A;  . TRANSURETHRAL RESECTION OF BLADDER TUMOR WITH GYRUS (TURBT-GYRUS) N/A 04/28/2015   Procedure: TRANSURETHRAL RESECTION OF BLADDER TUMOR ;  Surgeon: Kathie Rhodes, MD;  [facility]: [facility];  Service: Urology;  Laterality: N/A;  . TRANSURETHRAL RESECTION OF BLADDER TUMOR WITH GYRUS (TURBT-GYRUS) N/A 11/24/2015   Procedure: TRANSURETHRAL RESECTION OF BLADDER TUMOR WITH GYRUS (TURBT-GYRUS);  Surgeon: Kathie Rhodes, MD;  [facility];  Service: Urology;  Laterality: N/A;    Current Medications: Outpatient Medications Prior to Visit  Medication Sig Dispense Refill  . acetaminophen (TYLENOL) 500 MG tablet Take 1,000 mg by mouth every 6 (six) hours as needed for moderate pain.    Marland Kitchen allopurinol (ZYLOPRIM) 100 MG tablet Take 100 mg by mouth 2 (two) times daily.     Marland Kitchen amLODipine (NORVASC) 10 MG tablet Take 10 mg by mouth every morning.     Marland Kitchen  aspirin EC 81 MG EC tablet Take 1 tablet (81 mg total) by mouth daily. 1 tablet 0  . atorvastatin (LIPITOR) 20 MG tablet Take 20 mg by mouth daily at 6 PM.     . clopidogrel (PLAVIX) 75 MG tablet Take 1 tablet (75 mg total) by mouth daily. Please keep 01/31/17 appointment for further refills 45 tablet 0  . ferrous sulfate 325 (65 FE) MG tablet Take 325 mg by mouth every evening.     Marland Kitchen GLIPIZIDE XL 10 MG 24 hr tablet Take 10 mg by mouth 2 (two) times daily.  3  . hydrALAZINE (APRESOLINE) 50 MG tablet TAKE 1 TABLET BY MOUTH THREE TIMES DAILY AS NEEDED(WILL ONLY TAKE WHEN BLOOD PRESSURE IS RUNNING HIGH. WILL AT LEAST TAKE ONCE DAILY). 90 tablet 0  . insulin glargine (LANTUS) 100 UNIT/ML injection Inject 10 Units into the skin every morning.     . isosorbide mononitrate (IMDUR) 120 MG 24 hr tablet TAKE 1 TABLET BY MOUTH EVERY DAY 30 tablet 9  . magnesium oxide (MAG-OX) 400 (241.3 Mg) MG tablet Take 400 mg by mouth daily.    . metoprolol (LOPRESSOR) 100 MG tablet Take 0.5 tablets (50 mg total) by mouth 2 (two) times  daily. 90 tablet 3  . nitroGLYCERIN (NITROSTAT) 0.4 MG SL tablet PLACE 1 TABLET UNDER THE TONGUE EVERY 5 MINUTES AS NEEDED FOR CHEST PAIN 25 tablet 5  . pantoprazole (PROTONIX) 40 MG tablet TAKE 1 TABLET BY MOUTH DAILY 30 tablet 0  . torsemide (DEMADEX) 20 MG tablet TAKE 2 TABLETS BY MOUTH EVERY DAY 180 tablet 2  . Cranberry 500 MG CAPS Take 1 capsule by mouth at bedtime.    Marland Kitchen oseltamivir (TAMIFLU) 75 MG capsule Take 1 capsule (75 mg total) by mouth every 12 (twelve) hours. (Patient not taking: Reported on 01/31/2017) 10 capsule 0   No facility-administered medications prior to visit.      Allergies:   Other and Penicillins   Social History   Social History  . Marital status: Married    Spouse name: N/A  . Number of children: N/A  . Years of education: N/A   Social History Main Topics  . Smoking status: Former Smoker    Packs/day: 2.00    Years: 50.00    Types: Cigarettes    Quit date: 09/18/2015  . Smokeless tobacco: Never Used  . 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 Drug use: No  . Sexual activity: Not Asked   Other Topics Concern  . None   Social History Narrative   Tour manager            Family History:  The patient's family history includes CAD in his mother; Cirrhosis in his brother; Heart attack in his father; Heart disease in his father and mother; Hyperlipidemia in his father; Hypertension in his father and mother.   ROS:   Please see the history of present illness.    Bilateral claudication, left greater than right . Predominantly in the calves bilaterally All other systems reviewed and are negative.   PHYSICAL EXAM:   VS:  BP 136/62 (BP Location: Left Arm)   Pulse 68   Ht 5\' 6"  (1.676 m)   Wt 162 lb 12.8 oz (73.8 kg)   BMI 26.28 kg/m    GEN: Well nourished, well developed, in no acute distress  HEENT: normal  Neck: no JVD, carotid bruits, or  masses Cardiac: RRR; no murmurs, rubs, or  gallops,no edema  Respiratory:  clear to auscultation bilaterally, normal work of breathing GI: soft, nontender, nondistended, + BS MS: no deformity or atrophy  Skin: warm and dry, no rash Neuro:  Alert and Oriented x 3, Strength and sensation are intact Psych: euthymic mood, full affect  Wt Readings from Last 3 Encounters:  01/31/17 162 lb 12.8 oz (73.8 kg)  11/06/16 160 lb (72.6 kg)  09/01/16 161 lb (73 kg)      Studies/Labs Reviewed:   EKG:  EKG  Not repeated  Recent Labs: 05/11/2016: BUN 52; Creatinine, Ser 2.07; Hemoglobin 13.4; Platelets 228; Potassium 4.0; Sodium 135   Lipid Panel    Component Value Date/Time   CHOL 133 09/03/2014 0403   TRIG 138 09/03/2014 0403   HDL 41 09/03/2014 0403   CHOLHDL 3.2 09/03/2014 0403   VLDL 28 09/03/2014 0403   LDLCALC 64 09/03/2014 0403    Additional studies/ records that were reviewed today include:  New data    ASSESSMENT:    1. Coronary artery disease involving coronary bypass graft of native heart with angina pectoris ([place])   2. Atherosclerosis of native artery of both lower extremities with intermittent claudication ([place])   3. Essential hypertension   4. Other hyperlipidemia   5. Bilateral carotid artery disease ([place])   6. Panlobular emphysema ([place])   7. Type 2 diabetes mellitus with vascular disease ([place])      PLAN:  In order of problems listed above:  1. No recent angina. Use nitroglycerin for recurrent episodes of pain. 2. Encouraged walking. Has significant exertional claudication, both calves about equally although the left may be slightly worse. 3. Blood pressures under excellent control. Continue current medications as listed. Target 140/90 mmHg or less. 4. Above primary care. LDL target 70 or less. 5. His recent carotid Doppler indicated less than 40% stenosis bilateral.  Overall, doing well with planned follow-up in 6-9 months. No additions or changes in therapy.  Medication Adjustments/Labs and Tests  Ordered: Current medicines are reviewed at length with the patient today.  Concerns regarding medicines are outlined above.  Medication changes, Labs and Tests ordered today are listed in the Patient Instructions below. Patient Instructions  Medication Instructions:  Your physician recommends that you continue on your current medications as directed. Please refer to the Current Medication list given to you today.   Labwork: None ordered  Testing/Procedures: None ordered  Follow-Up: Your physician wants you to follow-up in 6 to 9 months with Dr. Tamala Julian. You will receive a reminder letter in the mail two months in advance. If you don't receive a letter, please call our office to schedule the follow-up appointment.  Any Other Special Instructions Will Be Listed Below (If Applicable).     If you need a refill on your cardiac medications before your next appointment, please call your pharmacy.      Signed, Sinclair Grooms, MD  01/31/2017 11:47 AM    [facility] [place], [place], [place]  [address] Phone: [phone]; Fax: [phone]

## 2017-01-31 ENCOUNTER — Encounter: Payer: Self-pay | Admitting: Interventional Cardiology

## 2017-01-31 ENCOUNTER — Other Ambulatory Visit: Payer: Self-pay | Admitting: Interventional Cardiology

## 2017-01-31 ENCOUNTER — Ambulatory Visit (INDEPENDENT_AMBULATORY_CARE_PROVIDER_SITE_OTHER): Payer: Medicare Other | Admitting: Interventional Cardiology

## 2017-01-31 VITALS — BP 136/62 | HR 68 | Ht 66.0 in | Wt 162.8 lb

## 2017-01-31 DIAGNOSIS — I739 Peripheral vascular disease, unspecified: Secondary | ICD-10-CM

## 2017-01-31 DIAGNOSIS — I209 Angina pectoris, unspecified: Secondary | ICD-10-CM | POA: Diagnosis not present

## 2017-01-31 DIAGNOSIS — I779 Disorder of arteries and arterioles, unspecified: Secondary | ICD-10-CM | POA: Diagnosis not present

## 2017-01-31 DIAGNOSIS — I25709 Atherosclerosis of coronary artery bypass graft(s), unspecified, with unspecified angina pectoris: Secondary | ICD-10-CM | POA: Diagnosis not present

## 2017-01-31 DIAGNOSIS — J431 Panlobular emphysema: Secondary | ICD-10-CM | POA: Diagnosis not present

## 2017-01-31 DIAGNOSIS — E1159 Type 2 diabetes mellitus with other circulatory complications: Secondary | ICD-10-CM | POA: Diagnosis not present

## 2017-01-31 DIAGNOSIS — I10 Essential (primary) hypertension: Secondary | ICD-10-CM

## 2017-01-31 DIAGNOSIS — E784 Other hyperlipidemia: Secondary | ICD-10-CM

## 2017-01-31 DIAGNOSIS — I70213 Atherosclerosis of native arteries of extremities with intermittent claudication, bilateral legs: Secondary | ICD-10-CM | POA: Diagnosis not present

## 2017-01-31 DIAGNOSIS — E7849 Other hyperlipidemia: Secondary | ICD-10-CM

## 2017-01-31 MED ORDER — ISOSORBIDE MONONITRATE ER 120 MG PO TB24: 120.0000 mg | ORAL_TABLET | Freq: Every day | ORAL | 3 refills | Status: AC

## 2017-01-31 NOTE — Patient Instructions (Signed)
Medication Instructions:  Your physician recommends that you continue on your current medications as directed. Please refer to the Current Medication list given to you today.   Labwork: None ordered  Testing/Procedures: None ordered  Follow-Up: Your physician wants you to follow-up in 6 to 9 months with Dr. Tamala Julian. You will receive a reminder letter in the mail two months in advance. If you don't receive a letter, please call our office to schedule the follow-up appointment.  Any Other Special Instructions Will Be Listed Below (If Applicable).     If you need a refill on your cardiac medications before your next appointment, please call your pharmacy.

## 2017-02-01 DIAGNOSIS — R21 Rash and other nonspecific skin eruption: Secondary | ICD-10-CM | POA: Diagnosis not present

## 2017-02-01 NOTE — Telephone Encounter (Signed)
Medication Detail    Disp Refills Start End   isosorbide mononitrate (IMDUR) 120 MG 24 hr tablet 90 tablet 3 01/31/2017    Sig - Route: Take 1 tablet (120 mg total) by mouth daily. - Oral   E-Prescribing Status: Receipt confirmed by pharmacy (01/31/2017 11:55 AM EDT)   Pharmacy   [id number] - [place], [place] - [address] AT [place]

## 2017-02-17 DIAGNOSIS — L308 Other specified dermatitis: Secondary | ICD-10-CM | POA: Diagnosis not present

## 2017-02-17 DIAGNOSIS — L309 Dermatitis, unspecified: Secondary | ICD-10-CM | POA: Diagnosis not present

## 2017-02-23 ENCOUNTER — Other Ambulatory Visit: Payer: Self-pay | Admitting: Interventional Cardiology

## 2017-03-04 ENCOUNTER — Other Ambulatory Visit: Payer: Self-pay | Admitting: Dermatopathology

## 2017-03-07 NOTE — Telephone Encounter (Signed)
Refill Request.  

## 2017-03-08 DIAGNOSIS — I739 Peripheral vascular disease, unspecified: Secondary | ICD-10-CM | POA: Diagnosis not present

## 2017-03-08 DIAGNOSIS — I5022 Chronic systolic (congestive) heart failure: Secondary | ICD-10-CM | POA: Diagnosis not present

## 2017-03-08 DIAGNOSIS — E782 Mixed hyperlipidemia: Secondary | ICD-10-CM | POA: Diagnosis not present

## 2017-03-08 DIAGNOSIS — N390 Urinary tract infection, site not specified: Secondary | ICD-10-CM | POA: Diagnosis not present

## 2017-03-08 DIAGNOSIS — I10 Essential (primary) hypertension: Secondary | ICD-10-CM | POA: Diagnosis not present

## 2017-03-08 DIAGNOSIS — K219 Gastro-esophageal reflux disease without esophagitis: Secondary | ICD-10-CM | POA: Diagnosis not present

## 2017-03-08 DIAGNOSIS — E1165 Type 2 diabetes mellitus with hyperglycemia: Secondary | ICD-10-CM | POA: Diagnosis not present

## 2017-03-08 DIAGNOSIS — C679 Malignant neoplasm of bladder, unspecified: Secondary | ICD-10-CM | POA: Diagnosis not present

## 2017-03-08 DIAGNOSIS — Z7984 Long term (current) use of oral hypoglycemic drugs: Secondary | ICD-10-CM | POA: Diagnosis not present

## 2017-03-08 DIAGNOSIS — N183 Chronic kidney disease, stage 3 (moderate): Secondary | ICD-10-CM | POA: Diagnosis not present

## 2017-03-08 DIAGNOSIS — I25119 Atherosclerotic heart disease of native coronary artery with unspecified angina pectoris: Secondary | ICD-10-CM | POA: Diagnosis not present

## 2017-03-08 DIAGNOSIS — M109 Gout, unspecified: Secondary | ICD-10-CM | POA: Diagnosis not present

## 2017-03-21 ENCOUNTER — Other Ambulatory Visit: Payer: Self-pay | Admitting: Interventional Cardiology

## 2017-03-21 DIAGNOSIS — N183 Chronic kidney disease, stage 3 unspecified: Secondary | ICD-10-CM

## 2017-03-22 ENCOUNTER — Other Ambulatory Visit: Payer: Self-pay | Admitting: *Deleted

## 2017-03-22 MED ORDER — METOPROLOL TARTRATE 100 MG PO TABS: 50.0000 mg | ORAL_TABLET | Freq: Two times a day (BID) | ORAL | 3 refills | Status: AC

## 2017-04-06 ENCOUNTER — Other Ambulatory Visit: Payer: Self-pay | Admitting: Urology

## 2017-04-06 DIAGNOSIS — C679 Malignant neoplasm of bladder, unspecified: Secondary | ICD-10-CM | POA: Diagnosis not present

## 2017-04-06 DIAGNOSIS — N400 Enlarged prostate without lower urinary tract symptoms: Secondary | ICD-10-CM | POA: Diagnosis not present

## 2017-04-06 DIAGNOSIS — Z8551 Personal history of malignant neoplasm of bladder: Secondary | ICD-10-CM | POA: Diagnosis not present

## 2017-04-06 DIAGNOSIS — R8271 Bacteriuria: Secondary | ICD-10-CM | POA: Diagnosis not present

## 2017-04-19 NOTE — Progress Notes (Signed)
LOV cardio Dr Tamala Julian 01-31-17 epic   [facility] vascular surgery Dr  Scot Dock 09-01-16  Cerebrovascular Duplex Exam (carotid artery study) 09-01-16 , Scot Dock MD impression reads "Doppler velocity suggest 1-39% right internal carotid artery stenosis. Velocities are lower than before in the internal carotid artery "  ECHO 12-18-15 epic; MD note reads " Aortic valve: Structurally normal valve. Trileaflet. Cusp separation was normal.  Doppler:  Transvalvular velocity was within the normal range. There was no stenosis. There was no regurgitation."  EKG 05-11-16 epic  CXR 11-06-16 epic

## 2017-04-19 NOTE — Patient Instructions (Signed)
[name]  04/19/2017   Your procedure is scheduled on: 04-25-17  Report to [facility]  elevators to 3rd floor to  [place] at 530AM.   Call this number if you have problems the morning of surgery [phone]    Remember: ONLY 1 PERSON MAY GO WITH YOU TO SHORT STAY TO GET  READY MORNING OF [place].  Do not eat food or drink liquids :After Midnight.     Take these medicines the morning of surgery with A SIP OF WATER: tylenol as needed, amlodipine(norvasc), colchicine, hydralazine as needed, isosorbide(IMDUR), metoprolol, pantoprazole(protonix),                                You may not have any metal on your body including hair pins and              piercings  Do not wear jewelry, make-up, lotions, powders or perfumes, deodorant                     Men may shave face and neck.   Do not bring valuables to the hospital. [place].  Contacts, dentures or bridgework may not be worn into surgery.      Patients discharged the day of surgery will not be allowed to drive home.  Name and phone number of your driver:  Special Instructions: N/A              Please read over the following fact sheets you were given: _____________________________________________________________________             How to Manage Your Diabetes Before and After Surgery  Why is it important to control my blood sugar before and after surgery? . Improving blood sugar levels before and after surgery helps healing and can limit problems. . A way of improving blood sugar control is eating a healthy diet by: o  Eating less sugar and carbohydrates o  Increasing activity/exercise o  Talking with your doctor about reaching your blood sugar goals . High blood sugars (greater than 180 mg/dL) can raise your risk of infections and slow your recovery, so you will need to focus on controlling your  diabetes during the weeks before surgery. . Make sure that the doctor who takes care of your diabetes knows about your planned surgery including the date and location.  How do I manage my blood sugar before surgery? . Check your blood sugar at least 4 times a day, starting 2 days before surgery, to make sure that the level is not too high or low. o Check your blood sugar the morning of your surgery when you wake up and every 2 hours until you get to the Short Stay unit. . If your blood sugar is less than 70 mg/dL, you will need to treat for low blood sugar: o Do not take insulin. o Treat a low blood sugar (less than 70 mg/dL) with  cup of clear juice (cranberry or apple), 4 glucose tablets, OR glucose gel. o Recheck blood sugar in 15 minutes after treatment (to make sure it is greater than 70 mg/dL). If your blood sugar is not greater than 70 mg/dL on recheck, call  [phone] for further instructions. . Report your blood sugar to the short stay nurse when you get to Short Stay.  . If you are admitted to the hospital after surgery: o Your blood sugar will be checked by the staff and you will probably be given insulin after surgery (instead of oral diabetes medicines) to make sure you have good blood sugar levels. o The goal for blood sugar control after surgery is 80-180 mg/dL.   WHAT DO I DO ABOUT MY DIABETES MEDICATION?  Marland Kitchen Do not take oral diabetes medicines (pills) the morning of surgery.  . THE DAY BEFORE SURGERY,   take usual GLIPIZIDE morning and lunch doses only. Do not take any evening dose.    Take 10 units of LANTUS insulin         . THE MORNING OF SURGERY,   DO NOT take GLIPIZIDE!  Take only 5 units of LANTUS insulin   Patient Signature:  Date:   Nurse Signature:  Date:   Reviewed and Endorsed by [facility] Patient Education Committee, August 2015  [facility] - Preparing for Surgery Before surgery, you can play an important role.  Because skin is not sterile,  your skin needs to be as free of germs as possible.  You can reduce the number of germs on your skin by washing with CHG (chlorahexidine gluconate) soap before surgery.  CHG is an antiseptic cleaner which kills germs and bonds with the skin to continue killing germs even after washing. Please DO NOT use if you have an allergy to CHG or antibacterial soaps.  If your skin becomes reddened/irritated stop using the CHG and inform your nurse when you arrive at Short Stay. Do not shave (including legs and underarms) for at least 48 hours prior to the first CHG shower.  You may shave your face/neck. Please follow these instructions carefully:  1.  Shower with CHG Soap the night before surgery and the  morning of Surgery.  2.  If you choose to wash your hair, wash your hair first as usual with your  normal  shampoo.  3.  After you shampoo, rinse your hair and body thoroughly to remove the  shampoo.                           4.  Use CHG as you would any other liquid soap.  You can apply chg directly  to the skin and wash                       Gently with a scrungie or clean washcloth.  5.  Apply the CHG Soap to your body ONLY FROM THE NECK DOWN.   Do not use on face/ open                           Wound or open sores. Avoid contact with eyes, ears mouth and genitals (private parts).                       Wash face,  Genitals (private parts) with your normal soap.             6.  Wash thoroughly, paying special attention to the area where your surgery  will be performed.  7.  Thoroughly rinse your body with warm water from the neck down.  8.  DO NOT shower/wash with your normal  soap after using and rinsing off  the CHG Soap.                9.  Pat yourself dry with a clean towel.            10.  Wear clean pajamas.            11.  Place clean sheets on your bed the night of your first shower and do not  sleep with pets. Day of Surgery : Do not apply any lotions/deodorants the morning of surgery.  Please wear  clean clothes to the hospital/surgery center.  FAILURE TO FOLLOW THESE INSTRUCTIONS MAY RESULT IN THE CANCELLATION OF YOUR SURGERY PATIENT SIGNATURE_________________________________  NURSE SIGNATURE__________________________________  ________________________________________________________________________

## 2017-04-21 ENCOUNTER — Encounter (HOSPITAL_COMMUNITY)
Admission: RE | Admit: 2017-04-21 | Discharge: 2017-04-21 | Disposition: A | Discharge status: Home / Self Care | Payer: Medicare Other | Source: Ambulatory Visit | Attending: Urology | Admitting: Urology

## 2017-04-21 DIAGNOSIS — I452 Bifascicular block: Secondary | ICD-10-CM | POA: Diagnosis not present

## 2017-04-21 DIAGNOSIS — Z01818 Encounter for other preprocedural examination: Secondary | ICD-10-CM | POA: Insufficient documentation

## 2017-04-21 DIAGNOSIS — Z01812 Encounter for preprocedural laboratory examination: Secondary | ICD-10-CM | POA: Insufficient documentation

## 2017-04-21 DIAGNOSIS — I10 Essential (primary) hypertension: Secondary | ICD-10-CM | POA: Insufficient documentation

## 2017-04-21 DIAGNOSIS — N400 Enlarged prostate without lower urinary tract symptoms: Secondary | ICD-10-CM | POA: Insufficient documentation

## 2017-04-21 DIAGNOSIS — Z8551 Personal history of malignant neoplasm of bladder: Secondary | ICD-10-CM | POA: Insufficient documentation

## 2017-04-21 LAB — BASIC METABOLIC PANEL
ANION GAP: 5 (ref 5–15)
BUN: 45 mg/dL — ABNORMAL HIGH (ref 6–20)
CALCIUM: 9.6 mg/dL (ref 8.9–10.3)
CO2: 29 mmol/L (ref 22–32)
CREATININE: 1.99 mg/dL — AB (ref 0.61–1.24)
Chloride: 103 mmol/L (ref 101–111)
GFR, EST AFRICAN AMERICAN: 36 mL/min — AB (ref >60)
GFR, EST NON AFRICAN AMERICAN: 31 mL/min — AB (ref >60)
GLUCOSE: 264 mg/dL — AB (ref 65–99)
Potassium: 5.2 mmol/L — ABNORMAL HIGH (ref 3.5–5.1)
Sodium: 137 mmol/L (ref 135–145)

## 2017-04-21 LAB — CBC
HCT: 37.2 % — ABNORMAL LOW (ref 39.0–52.0)
HEMOGLOBIN: 12.7 g/dL — AB (ref 13.0–17.0)
MCH: 27.8 pg (ref 26.0–34.0)
MCHC: 34.1 g/dL (ref 30.0–36.0)
MCV: 81.4 fL (ref 78.0–100.0)
PLATELETS: 216 10*3/uL (ref 150–400)
RBC: 4.57 MIL/uL (ref 4.22–5.81)
RDW: 14 % (ref 11.5–15.5)
WBC: 8.9 10*3/uL (ref 4.0–10.5)

## 2017-04-21 LAB — GLUCOSE, CAPILLARY: Glucose-Capillary: 269 mg/dL — ABNORMAL HIGH (ref 65–99)

## 2017-04-21 NOTE — Progress Notes (Addendum)
Bmp result routed via epic to Dr Karsten Ro

## 2017-04-21 NOTE — Progress Notes (Signed)
Pt with significant cardiac hx including MI, CABG, cardiac catherization, CAD. No cardiac clearance in epic or on chart. LOV note with cardiologist Daneen Schick in April suggests patient doing well with managing BP and without recent cardiac event needing nitroglycerin. Pt last EKG within 1 year range to use for pre-op however with pt complex cardiac history, RN opted to obtain new EKG. New EKG showed multiple changes as compared to previous. RN called and spoke to Oddono MD of anesthesia who recommended RN bring over new and old EKG, last ECHO result, and last cardiac catherization results to anesthesia for review. RN took over and met with Dr Jillyn Hidden of Anesthesia as he was available for consult. RN reviewed with him patient cardiac hx, medication management, cardiologist visit note and prior suggested documents from speaking with Dr Ambrose Pancoast. Dr Jillyn Hidden unable to clearly see old EKG axes numbers when comparing new and old EKG. Requested RN to provide readings before making his final decision. RN agreeable and obtained clearer listing of axes on old EKG and phoned them to [place] who then, after comparing axes numbers, okayed patient to proceed with surgery. Patient present when RN speaking to [place] over the phone . RN informed patient of [place] recommendation. Patient verbalized understanding and agreeable to anesthesia decision.

## 2017-04-22 LAB — HEMOGLOBIN A1C
Hgb A1c MFr Bld: 8.8 % — ABNORMAL HIGH (ref 4.8–5.6)
Mean Plasma Glucose: 206 mg/dL

## 2017-04-22 NOTE — Progress Notes (Signed)
Patient with known diabetes . Elevated blood glucose and hga1c, and glucose capillary . Prior blood work shows history of same results elevated. RN consulted with fellow colleague RN and they agreed proper F/U is to route results to surgeon via epic .

## 2017-04-22 NOTE — Progress Notes (Addendum)
HGa1c, glucose capillary, and bmp RESULT ROUTED VIA EPIC TO DR Karsten Ro

## 2017-04-24 NOTE — H&P (Signed)
HPI: [name] is a 76 year-old male established patient who is here for follow-up of bladder cancer.  His bladder cancer was superficial and limitied to the bladder lining. His bladder cancer was not muscle invasive.   He did have a TURBT. His last bladder tumor was resected 11/24/2015. He has had the following number of bladder resections: 3. He did not have radiation to treat his bladder cancer. He had treatment with the following intravesical agents: BCG. Patient denies Mitomycin, Interferon, and Adriamycin.   He has not had blood in his urine recently. He is not having new bone pain. He has not recently had unwanted weight loss.   His last cysto was 01/06/2016. His last radiologic test to evaluate the kidneys was 12/08/2015.   He does perform CIC.   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 My concern was that it may have scarred down to the point where it became obliterated and my concern was that of transitional cell carcinoma growing within a bladder diverticulum so I obtained a CT scan of the pelvis. This revealed no evidence of extravesical spread and it did identify a right-sided bladder diverticulum.  He's been doing well. He continues to perform self-catheterization up to 8 times a day. He has not been having any symptoms to suggest an active infection.  It has been sometime since his last PSA and he had a history in the past of a prostate nodule in the left apical region that had remained unchanged for many years. We discussed checking his prostate and PSA today.   Interval history 04/06/17: He reports she's been having 2 problems recently. The first is that when he catheterizes he feels he has running into some form of obstruction but is able to get a  catheter in his bladder the problem is because he has to take Plavix he has been having gross hematuria at times will cause obstruction of the catheter. The other issue is that when he does self catheterize he notes a whitish material that also tends to call the catheter. He otherwise is doing well.     ALLERGIES: Augmentin Penicillin    MEDICATIONS: Allopurinol 100 mg tablet  Plavix 75 mg tablet  Acetaminophen 325 mg tablet Oral  AmLODIPine Besylate 10 MG Oral Tablet Oral  Aspirin Ec 81 mg tablet, delayed release Oral  Atorvastatin Calcium 20 mg tablet Oral  Colchicine 0.6 mg capsule Oral  Ferrous Sulfate 325 mg (65 mg iron) tablet  Glipizide Xl 10 mg tablet, extended release 24 hr Oral  Hydralazine Hcl 50 mg tablet Oral  Isosorbide Mononitrate Er 120 mg tablet, extended release 24 hr  Lantus Solostar 100 unit/ml (3 ml) insulin pen Subcutaneous  Magnesium Oxide 400 mg tablet  Metoprolol Succinate ER 50 MG Oral Tablet Extended Release 24 Hour Oral  Nitrostat 0.4 MG Sublingual Tablet Sublingual Sublingual  Pantoprazole Sodium 40 mg tablet, delayed release Oral  Torsemide 20 mg tablet Oral     GU PSH: Bladder Instill AntiCA Agent - 04/21/2016, 04/07/2016, 03/31/2016 Cystoscopy - 01/05/2017, 10/06/2016, 07/06/2016, 03/31/2016 Cystoscopy TURBT >5 cm - 05/09/2015, 2016 Cystoscopy TURBT 2-5 cm - 12/02/2015      PSH Notes: Cystoscopy With Fulguration Medium Lesion (2-5cm), Cystoscopy With Fulguration Large Lesion (Over 5cm), Cystoscopy With Fulguration Large  Lesion (Over 5cm), Coronary Artery Triple Arterial Bypass Graft, Laminectomy Cervical, Ankle Repair   NON-GU PSH: Cabg; Arterial; Three - 2015 Remove Spinal Lamina - 2013    GU PMH: Encounter for Prostate Cancer screening (Stable), His prostate is noted to be smooth and benign. The nodularity in the apex of the prostate on the left side is not as prominent on examination today. I will obtain a PSA. - 01/05/2017, Prostate cancer screening, -  2015 History of bladder cancer (Stable), He had no evidence of recurrent transitional cell carcinoma of the bladder today. Urine will be sent for cytology and I will plan to see him back in 3 months for repeat surveillance cystoscopy. I will also obtain a repeat CT scan of the pelvis at that time. - 01/05/2017, (Stable), He did not have any definite evidence of recurrent transitional cell carcinoma of the bladder however I will also evaluate him with a repeat CT scan of the pelvis. As long as this looks good I will plan to tentatively see him back in 3 months for repeat surveillance cystoscopy., - 10/06/2016 (Stable), He had no definite evidence of recurrent transitional cell carcinoma of the bladder today. I will send urine for cytology. He will then return In 3 months for surveillance cystoscopy. 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 He was not a surgical candidate at that time so I placed him on medical management after I first saw him and noted his creatinine improved slightly and since then he says his urination has remained stable. He did see some improvement after starting the medication. Cystoscopy in 10/13 revealed no significant prostatic obstruction.    Prostate nodule: He has a nodule in the apex of the prostate on the left side that has remained unchanged over the years. In addition his PSA has been found to be completely normal and stable as well.   Gross hematuria: He was experiencing mild dysuria and was placed on empiric antibiotics with resolution of his hematuria. In 4/13 he underwent a full hematuria workup with CT scan and cystoscopy which were found to be negative. At that time my feeling was that his hematuria was secondary to a UTI.   Incomplete bladder emptying/urinary retention: He was experiencing dysuria, frequency and lower abdominal pain and was found to have a PVR of 468 cc. A Foley catheter was inserted.  Urodynamics 10/13: He was found to have a hypotonic, large capacity bladder with no significant evidence of outlet obstruction.  Treatment: CIC 4 times a day and prior to bed and tamsulosin. (Catheter requirement #200/month)   Transitional cell carcinoma of the bladder: Renal ultrasound done on 12/25/14 revealed no evidence of hydronephrosis or renal mass however an abnormality of the posterior wall was noted and found cystoscopically to be a bladder tumor.  TURBT 03/03/15: Extensive tumor was resected from the floor  and right wall of the bladder including from a bladder diverticulum.  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  Pathology: Similar findings with muscularis present and uninvolved (T1,G3)  Induction course of BCG completed 10/16.  Recurrence 1/17---> TURBT 11/24/15 interestingly his diverticulum had been completely obliterated and was no longer present.  Pathology: High-grade TCCa with no evidence of invasion (Ta,G3)   Recurrent UTIs: He has had UTIs in the past caused by different organisms.  Treatment: Nitrofurantoin low-dose nightly  prophylaxis.   NON-GU PMH: Bacteriuria, His urine will be cultured to rule out infection. - 03/31/2016 Encounter for other specified prophylactic measures, he will receive the BCG treatment today and weekly for a total of 3 weeks - 03/31/2016 Other long term (current) drug therapy, High risk medication use - 12/04/2015 Long term (current) use of antibiotics, Need for prophylactic antibiotic - 07/14/2015 Encounter for general adult medical examination without abnormal findings, Encounter for preventive health examination - 2015 Personal history of other diseases of the circulatory system, History of hypertension - 2014 Personal history of other endocrine, nutritional and metabolic disease, History of hypercholesterolemia - 2014, History of diabetes mellitus, - 2014 Personal history of other specified conditions, History of heartburn - 2014    FAMILY HISTORY: No pertinent family history - Other   SOCIAL HISTORY: Marital Status: Married Current Smoking Status: Patient does not smoke anymore.  Has never drank.  Drinks 1 caffeinated drink per day.     Notes: Former smoker, Tobacco Use, Caffeine Use   REVIEW OF SYSTEMS:    GU Review Male:   Patient reports frequent urination, get up at night to urinate, and erection problems. Patient denies hard to postpone urination, burning/ pain with urination, leakage of urine, stream starts and stops, trouble starting your stream, have to strain to urinate , and penile pain.  Gastrointestinal (Upper):   Patient reports indigestion/ heartburn. Patient denies nausea and vomiting.  Gastrointestinal (Lower):   Patient denies diarrhea and constipation.  Constitutional:   Patient reports fatigue. Patient denies fever, night sweats, and weight loss.  Skin:   Patient denies skin rash/ lesion and itching.  Eyes:   Patient reports blurred vision. Patient denies double vision.  Ears/ Nose/ Throat:   Patient denies sore throat and sinus problems.  Hematologic/Lymphatic:    Patient reports easy bruising. Patient denies swollen glands.  Cardiovascular:   Patient denies leg swelling and chest pains.  Respiratory:   Patient denies cough and shortness of breath.  Endocrine:   Patient denies excessive thirst.  Musculoskeletal:   Patient denies back pain and joint pain.  Neurological:   Patient denies headaches and dizziness.  Psychologic:   Patient denies depression and anxiety.   VITAL SIGNS:    Weight 158 lb / 71.67 kg  Height 66 in / 167.64 cm  BP 137/54 mmHg  Pulse 54 /min  BMI 25.5 kg/m   Physical Exam  Constitutional: Well nourished and well developed . No acute distress.   ENT:. The ears and nose are normal in appearance.   Neck: The appearance of the neck is normal and no neck mass is present.   Pulmonary: No respiratory distress and normal respiratory rhythm and effort.   Cardiovascular: Heart rate and rhythm are normal . No peripheral edema.   Abdomen: The abdomen is soft and nontender. No masses are palpated. No CVA  tenderness. No hernias are palpable. No hepatosplenomegaly noted.   Rectal: Rectal exam demonstrates normal sphincter tone, no tenderness and no masses. The prostate has no nodularity and is not tender. The left seminal vesicle is nonpalpable. The right seminal vesicle is nonpalpable. The perineum is normal on inspection.   Genitourinary: Examination of the penis demonstrates no discharge, no masses, no lesions and a normal meatus. The scrotum is without lesions. Examination of the right scrotum demonstrates a hydrocele. The right epididymis is palpably normal and non-tender. The left epididymis is palpably normal and non-tender. The right testis is non-tender and without masses. The left testis is non-tender and without masses.   Lymphatics: The femoral and inguinal nodes are not enlarged or tender.   Skin: Normal skin turgor, no visible rash and no visible skin lesions.   Neuro/Psych:. Mood and affect are appropriate.      PAST DATA REVIEWED:  Source Of History:  Patient  Lab Test Review:   BUN/Creatinine  Records Review:   Previous Patient Records, POC Tool   01/05/17 03/12/15 01/14/14 01/07/11 12/23/09 12/23/08 11/28/07  PSA  Total PSA 0.66 ng/dl 1.50  3.33  0.72  1.24  1.95  2.32    Notes:                     His creatinine on 03/08/17 was 1.94.   PROCEDURES:         C.T. Pelvis w/o Contrast    CT PELVIS WITHOUT CONTRAST     TECHNIQUE:  Multidetector CT imaging of the pelvis was performed following the  standard protocol without intravenous contrast.     COMPARISON:  CT 10/13/2016     FINDINGS:  Urinary Tract: New thickening in the posterior RIGHT aspect of the  bladder at the level of the vesicoureteral junction. Thickening  expands on 4.5 cm surface of the bladder (image 29, series 2).  Thickening extends along a previous described bladder diverticulum  adjacent to the distal RIGHT ureter or involving the distal RIGHT  ureter. Thickening measures up to 3.3 cm (image 29, series 2).     Small amount gas in the bladder consistent with recent  instrumentation.     Bowel: Limited view of the colon demonstrates several diverticula of  the descending colon. Appendix normal.     Vascular/Lymphatic: Atherosclerotic calcification of the aorta and  iliac arteries. No lymphadenopathy in the pelvis.     Reproductive:  Prostate normal     Other:  No free-fluid.     Musculoskeletal: No aggressive osseous lesion.     IMPRESSION:  1. Local recurrence of bladder carcinoma along the posterior RIGHT  aspect of the bladder at the level of the vesicoureteral junction.  2. Bladder diverticulum versus dilatation of the distal RIGHT ureter  also appears involved with carcinoma.  3. No pelvic lymphadenopathy.  4.  Aortic Atherosclerosis          Flexible Cystoscopy - on 04/06/17 Risks, benefits, and some of the potential complications of the procedure were discussed at length with the patient  including infection, bleeding, voiding discomfort, urinary retention, fever, chills, sepsis, and others. All questions were answered. Informed consent was obtained. Sterile technique and 2% Lidocaine intraurethral analgesia were used.  Meatus:  Normal size. Normal location. Normal condition.  Urethra:  No strictures.  External Sphincter:  Normal.  Verumontanum:  Normal.  Prostate:  Moderate prostatic regrowth. Non-obstructing. No hyperplasia.  Bladder Neck:  Non-obstructing.  Ureteral Orifices:  Normal location. Normal  size. Normal shape. Effluxed clear urine.  Bladder:  No trabeculation. No tumors. Normal mucosa. No stones. The area where his previous diverticulum had been located reveals some changes but I did not see any definite recurrent tumor in that location.       The lower urinary tract was carefully examined. The procedure was well-tolerated and without complications. Instructions were given to call the office immediately for bloody urine, difficulty urinating, urinary retention, painful or frequent urination, fever or other illness. The patient stated that he understood these instructions and would comply with them.         Urinalysis w/Scope Dipstick Dipstick Cont'd Micro  Color: Yellow Bilirubin: Neg WBC/hpf: 20 - 40/hpf  Appearance: Cloudy Ketones: Neg RBC/hpf: 10 - 20/hpf  Specific Gravity: 1.015 Blood: 2+ Bacteria: Few (10-25/hpf)  pH: <=5.0 Protein: Trace Cystals: NS (Not Seen)  Glucose: Neg Urobilinogen: 0.2 Casts: NS (Not Seen)    Nitrites: Neg Trichomonas: Not Present    Leukocyte Esterase: Trace Mucous: Not Present      Epithelial Cells: 0 - 5/hpf      Yeast: NS (Not Seen)      Sperm: Not Present    ASSESSMENT/PLAN:      ICD-10 Details  1 GU:   History of bladder cancer - Z85.51 Worsening - I again found no evidence of recurrence in his bladder but on the floor of the bladder where his previous diverticulum was noted there is some whitish, fibrinous material. This  is what is causing intermittent obstruction of his catheter when he performs self-catheterization and so what I have proposed is that this be addressed under anesthesia to allow me to potentially remove this material and also biopsy this area where he had previously had his bladder cancer.  2   BPH w/o LUTS - N40.0 Stable - Because he is having intermittent gross hematuria with self-catheterization on Plavix what I have recommended, based on my cystoscopic findings today, is that I perform a resection of the nodule of prostate tissue that is present on the floor of the prostate near the bladder neck similar to a median lobe. This will allow him to perform self-catheterization more easily and hopefully reduce the difficulty that he is experiencing with intermittent hematuria with catheterization.

## 2017-04-25 ENCOUNTER — Ambulatory Visit (HOSPITAL_COMMUNITY)
Admission: RE | Admit: 2017-04-25 | Discharge: 2017-04-25 | Disposition: A | Discharge status: Home / Self Care | Payer: Medicare Other | Source: Ambulatory Visit | Attending: Urology | Admitting: Urology

## 2017-04-25 ENCOUNTER — Encounter (HOSPITAL_COMMUNITY): Payer: Self-pay | Admitting: *Deleted

## 2017-04-25 ENCOUNTER — Encounter (HOSPITAL_COMMUNITY)
Admission: RE | Disposition: A | Discharge status: Home / Self Care | Payer: Self-pay | Source: Ambulatory Visit | Attending: Urology

## 2017-04-25 ENCOUNTER — Ambulatory Visit (HOSPITAL_COMMUNITY): Payer: Medicare Other | Admitting: Anesthesiology

## 2017-04-25 DIAGNOSIS — Z87891 Personal history of nicotine dependence: Secondary | ICD-10-CM | POA: Diagnosis not present

## 2017-04-25 DIAGNOSIS — E7800 Pure hypercholesterolemia, unspecified: Secondary | ICD-10-CM | POA: Diagnosis not present

## 2017-04-25 DIAGNOSIS — R310 Gross hematuria: Secondary | ICD-10-CM | POA: Insufficient documentation

## 2017-04-25 DIAGNOSIS — I5032 Chronic diastolic (congestive) heart failure: Secondary | ICD-10-CM | POA: Diagnosis not present

## 2017-04-25 DIAGNOSIS — N400 Enlarged prostate without lower urinary tract symptoms: Secondary | ICD-10-CM | POA: Insufficient documentation

## 2017-04-25 DIAGNOSIS — Z7982 Long term (current) use of aspirin: Secondary | ICD-10-CM | POA: Diagnosis not present

## 2017-04-25 DIAGNOSIS — N1330 Unspecified hydronephrosis: Secondary | ICD-10-CM | POA: Insufficient documentation

## 2017-04-25 DIAGNOSIS — I10 Essential (primary) hypertension: Secondary | ICD-10-CM | POA: Insufficient documentation

## 2017-04-25 DIAGNOSIS — Z794 Long term (current) use of insulin: Secondary | ICD-10-CM | POA: Insufficient documentation

## 2017-04-25 DIAGNOSIS — Z7902 Long term (current) use of antithrombotics/antiplatelets: Secondary | ICD-10-CM | POA: Insufficient documentation

## 2017-04-25 DIAGNOSIS — D494 Neoplasm of unspecified behavior of bladder: Secondary | ICD-10-CM

## 2017-04-25 DIAGNOSIS — I700 Atherosclerosis of aorta: Secondary | ICD-10-CM | POA: Insufficient documentation

## 2017-04-25 DIAGNOSIS — Z880 Allergy status to penicillin: Secondary | ICD-10-CM | POA: Diagnosis not present

## 2017-04-25 DIAGNOSIS — C679 Malignant neoplasm of bladder, unspecified: Secondary | ICD-10-CM | POA: Diagnosis not present

## 2017-04-25 DIAGNOSIS — N401 Enlarged prostate with lower urinary tract symptoms: Secondary | ICD-10-CM | POA: Diagnosis not present

## 2017-04-25 DIAGNOSIS — C61 Malignant neoplasm of prostate: Secondary | ICD-10-CM | POA: Diagnosis not present

## 2017-04-25 DIAGNOSIS — Z79899 Other long term (current) drug therapy: Secondary | ICD-10-CM | POA: Insufficient documentation

## 2017-04-25 DIAGNOSIS — Z8551 Personal history of malignant neoplasm of bladder: Secondary | ICD-10-CM | POA: Diagnosis not present

## 2017-04-25 HISTORY — PX: TRANSURETHRAL RESECTION OF PROSTATE: SHX73

## 2017-04-25 HISTORY — PX: CYSTOSCOPY WITH BIOPSY: SHX5122

## 2017-04-25 LAB — GLUCOSE, CAPILLARY
Glucose-Capillary: 120 mg/dL — ABNORMAL HIGH (ref 65–99)
Glucose-Capillary: 157 mg/dL — ABNORMAL HIGH (ref 65–99)

## 2017-04-25 SURGERY — CYSTOSCOPY, WITH BIOPSY
Anesthesia: General

## 2017-04-25 MED ORDER — SODIUM CHLORIDE 0.9 % IR SOLN
Status: DC | PRN
  Administered 2017-04-25: 1000 mL

## 2017-04-25 MED ORDER — LIDOCAINE HCL (CARDIAC) 20 MG/ML IV SOLN
INTRAVENOUS | Status: DC | PRN
  Administered 2017-04-25: 80 mg via INTRATRACHEAL

## 2017-04-25 MED ORDER — INDIGOTINDISULFONATE SODIUM 8 MG/ML IJ SOLN
INTRAMUSCULAR | Status: DC | PRN
  Administered 2017-04-25: 5 mL via INTRAVENOUS

## 2017-04-25 MED ORDER — PHENYLEPHRINE 40 MCG/ML (10ML) SYRINGE FOR IV PUSH (FOR BLOOD PRESSURE SUPPORT)
PREFILLED_SYRINGE | INTRAVENOUS | Status: AC
  Filled 2017-04-25: qty 10

## 2017-04-25 MED ORDER — BELLADONNA-OPIUM 16.2-30 MG RE SUPP
RECTAL | Status: AC
  Filled 2017-04-25: qty 1

## 2017-04-25 MED ORDER — BELLADONNA ALKALOIDS-OPIUM 16.2-60 MG RE SUPP
1.0000 | Freq: Once | RECTAL | Status: DC
  Filled 2017-04-25: qty 1

## 2017-04-25 MED ORDER — PHENYLEPHRINE HCL 10 MG/ML IJ SOLN
INTRAMUSCULAR | Status: DC | PRN
  Administered 2017-04-25: 80 ug via INTRAVENOUS

## 2017-04-25 MED ORDER — FENTANYL CITRATE (PF) 100 MCG/2ML IJ SOLN
INTRAMUSCULAR | Status: DC | PRN
  Administered 2017-04-25 (×2): 50 ug via INTRAVENOUS
  Administered 2017-04-25: 25 ug via INTRAVENOUS
  Administered 2017-04-25: 50 ug via INTRAVENOUS
  Administered 2017-04-25: 25 ug via INTRAVENOUS

## 2017-04-25 MED ORDER — SODIUM CHLORIDE 0.9 % IR SOLN
Status: DC | PRN
  Administered 2017-04-25: 12000 mL

## 2017-04-25 MED ORDER — BELLADONNA-OPIUM 16.2-30 MG RE SUPP
1.0000 | Freq: Once | RECTAL | Status: AC
  Administered 2017-04-25: 1 via RECTAL

## 2017-04-25 MED ORDER — INDIGOTINDISULFONATE SODIUM 8 MG/ML IJ SOLN
INTRAMUSCULAR | Status: AC
  Filled 2017-04-25: qty 5

## 2017-04-25 MED ORDER — DEXAMETHASONE SODIUM PHOSPHATE 10 MG/ML IJ SOLN
INTRAMUSCULAR | Status: AC
  Filled 2017-04-25: qty 1

## 2017-04-25 MED ORDER — STERILE WATER FOR IRRIGATION IR SOLN
Status: DC | PRN
  Administered 2017-04-25: 500 mL

## 2017-04-25 MED ORDER — METOCLOPRAMIDE HCL 5 MG/ML IJ SOLN: 10.0000 mg | Freq: Once | INTRAMUSCULAR | Status: DC | PRN

## 2017-04-25 MED ORDER — LACTATED RINGERS IV SOLN
INTRAVENOUS | Status: DC | PRN
  Administered 2017-04-25: 07:00:00 via INTRAVENOUS

## 2017-04-25 MED ORDER — PROPOFOL 10 MG/ML IV BOLUS
INTRAVENOUS | Status: AC
  Filled 2017-04-25: qty 20

## 2017-04-25 MED ORDER — ONDANSETRON HCL 4 MG/2ML IJ SOLN
INTRAMUSCULAR | Status: AC
  Filled 2017-04-25: qty 2

## 2017-04-25 MED ORDER — HYDROCODONE-ACETAMINOPHEN 10-325 MG PO TABS: 1.0000 | ORAL_TABLET | ORAL | 0 refills | Status: DC | PRN

## 2017-04-25 MED ORDER — MEPERIDINE HCL 50 MG/ML IJ SOLN: 6.2500 mg | INTRAMUSCULAR | Status: DC | PRN

## 2017-04-25 MED ORDER — CIPROFLOXACIN IN D5W 400 MG/200ML IV SOLN
INTRAVENOUS | Status: AC
  Filled 2017-04-25: qty 200

## 2017-04-25 MED ORDER — DEXAMETHASONE SODIUM PHOSPHATE 10 MG/ML IJ SOLN
INTRAMUSCULAR | Status: DC | PRN
  Administered 2017-04-25: 10 mg via INTRAVENOUS

## 2017-04-25 MED ORDER — FENTANYL CITRATE (PF) 100 MCG/2ML IJ SOLN
INTRAMUSCULAR | Status: AC
  Filled 2017-04-25: qty 2

## 2017-04-25 MED ORDER — FENTANYL CITRATE (PF) 100 MCG/2ML IJ SOLN
25.0000 ug | INTRAMUSCULAR | Status: DC | PRN
  Administered 2017-04-25 (×2): 50 ug via INTRAVENOUS

## 2017-04-25 MED ORDER — CIPROFLOXACIN IN D5W 400 MG/200ML IV SOLN
400.0000 mg | INTRAVENOUS | Status: AC
  Administered 2017-04-25: 400 mg via INTRAVENOUS

## 2017-04-25 MED ORDER — LIDOCAINE 2% (20 MG/ML) 5 ML SYRINGE
INTRAMUSCULAR | Status: AC
  Filled 2017-04-25: qty 5

## 2017-04-25 MED ORDER — PHENAZOPYRIDINE HCL 200 MG PO TABS: 200.0000 mg | ORAL_TABLET | Freq: Three times a day (TID) | ORAL | 0 refills | Status: DC | PRN

## 2017-04-25 MED ORDER — ONDANSETRON HCL 4 MG/2ML IJ SOLN
INTRAMUSCULAR | Status: DC | PRN
  Administered 2017-04-25: 4 mg via INTRAVENOUS

## 2017-04-25 MED ORDER — PROPOFOL 10 MG/ML IV BOLUS
INTRAVENOUS | Status: DC | PRN
  Administered 2017-04-25: 150 mg via INTRAVENOUS

## 2017-04-25 SURGICAL SUPPLY — 25 items
BAG URINE DRAINAGE (UROLOGICAL SUPPLIES) ×1 IMPLANT
BAG URO CATCHER STRL LF (MISCELLANEOUS) ×2 IMPLANT
BLADE SURG 15 STRL LF DISP TIS (BLADE) IMPLANT
BLADE SURG 15 STRL SS (BLADE)
CATH FOLEY 2WAY SLVR  5CC 22FR (CATHETERS) ×1
CATH FOLEY 2WAY SLVR 5CC 22FR (CATHETERS) IMPLANT
CATH FOLEY 3WAY 30CC 24FR (CATHETERS)
CATH URTH STD 24FR FL 3W 2 (CATHETERS) ×1 IMPLANT
COVER SURGICAL LIGHT HANDLE (MISCELLANEOUS) ×1 IMPLANT
ELECT REM PT RETURN 15FT ADLT (MISCELLANEOUS) ×1 IMPLANT
EVACUATOR MICROVAS BLADDER (UROLOGICAL SUPPLIES) ×2 IMPLANT
GLOVE BIOGEL M 8.0 STRL (GLOVE) ×2 IMPLANT
GOWN STRL REUS W/ TWL XL LVL3 (GOWN DISPOSABLE) ×1 IMPLANT
GOWN STRL REUS W/TWL XL LVL3 (GOWN DISPOSABLE) ×4 IMPLANT
HOLDER FOLEY CATH W/STRAP (MISCELLANEOUS) ×1 IMPLANT
LOOP CUT BIPOLAR 24F LRG (ELECTROSURGICAL) ×1 IMPLANT
MANIFOLD NEPTUNE II (INSTRUMENTS) ×2 IMPLANT
NS IRRIG 1000ML POUR BTL (IV SOLUTION) ×2 IMPLANT
PACK CYSTO (CUSTOM PROCEDURE TRAY) ×2 IMPLANT
SET ASPIRATION TUBING (TUBING) ×1 IMPLANT
SUT ETHILON 3 0 PS 1 (SUTURE) IMPLANT
SYR 30ML LL (SYRINGE) ×1 IMPLANT
SYRINGE IRR TOOMEY STRL 70CC (SYRINGE) IMPLANT
TUBING CONNECTING 10 (TUBING) ×2 IMPLANT
WIRE COONS/BENSON .038X145CM (WIRE) IMPLANT

## 2017-04-25 NOTE — Progress Notes (Signed)
Foley catheter irrigated with 30 cc Normal Saline all returned with no clots noted- Foley catheter draining.

## 2017-04-25 NOTE — Anesthesia Preprocedure Evaluation (Addendum)
Anesthesia Evaluation  Patient identified by MRN, date of birth, ID band Patient awake    Reviewed: Allergy & Precautions, NPO status , Patient's Chart, lab work & pertinent test results  Airway Mallampati: II  TM Distance: >3 FB Neck ROM: Full    Dental no notable dental hx. (+) Edentulous Upper, Edentulous Lower   Pulmonary sleep apnea , COPD, former smoker,    Pulmonary exam normal breath sounds clear to auscultation       Cardiovascular hypertension, Pt. on medications + angina + CAD, + Past MI, + CABG (1999 and 2015), + Peripheral Vascular Disease and +CHF  Normal cardiovascular exam+ dysrhythmias Atrial Fibrillation and Supra Ventricular Tachycardia  Rhythm:Regular Rate:Normal     Neuro/Psych negative neurological ROS  negative psych ROS   GI/Hepatic Neg liver ROS, GERD  Medicated and Controlled,  Endo/Other  diabetes, Type 2  Renal/GU negative Renal ROS  negative genitourinary   Musculoskeletal negative musculoskeletal ROS (+)   Abdominal   Peds negative pediatric ROS (+)  Hematology negative hematology ROS (+)   Anesthesia Other Findings   Reproductive/Obstetrics negative OB ROS                            Anesthesia Physical Anesthesia Plan  ASA: III  Anesthesia Plan: General   Post-op Pain Management:    Induction:   PONV Risk Score and Plan: 2 and Ondansetron and Dexamethasone  Airway Management Planned: LMA  Additional Equipment:   Intra-op Plan:   Post-operative Plan:   Informed Consent: I have reviewed the patients History and Physical, chart, labs and discussed the procedure including the risks, benefits and alternatives for the proposed anesthesia with the patient or authorized representative who has indicated his/her understanding and acceptance.   Dental advisory given  Plan Discussed with:   Anesthesia Plan Comments:         Anesthesia Quick  Evaluation

## 2017-04-25 NOTE — Progress Notes (Signed)
Dr. Marcell Barlow notified of patient's condition- O.K. To go to Short Stay for discharge

## 2017-04-25 NOTE — Progress Notes (Signed)
Patient complains of pressure to void "bladder feels full"- -"penis sore"- abdomen  Distended-soft-Foley catheter irrigated with 30 cc Normal Saline-all returned with long stingy clot obtained.- Foley catheter irrigated again with 30 cc Normal Saline- returns medium red- colored liquid

## 2017-04-25 NOTE — Progress Notes (Signed)
Foley catheter irrigated with 30 cc Normal Saline-goes in and comes out easily- returns pinkish -red in color

## 2017-04-25 NOTE — Anesthesia Postprocedure Evaluation (Signed)
Anesthesia Post Note  Patient: [name]  Procedure(s) Performed: Procedure(s) (LRB): CYSTOSCOPY WITH BLADDER BIOPSY (N/A) TRANSURETHRAL RESECTION OF THE PROSTATE (TURP) (N/A)     Patient location during evaluation: PACU Anesthesia Type: General Level of consciousness: awake and alert Pain management: pain level controlled Vital Signs Assessment: post-procedure vital signs reviewed and stable Respiratory status: spontaneous breathing, nonlabored ventilation, respiratory function stable and patient connected to nasal cannula oxygen Cardiovascular status: blood pressure returned to baseline and stable Postop Assessment: no signs of nausea or vomiting Anesthetic complications: no    Last Vitals:  Vitals:   04/25/17 1205 04/25/17 1235  BP: (!) 136/40 117/78  Pulse: (!) 56 69  Resp: 20 20  Temp: 36.9 C 36.4 C    Last Pain:  Vitals:   04/25/17 1235  TempSrc: Oral  PainSc: 1                  Montez Hageman

## 2017-04-25 NOTE — Discharge Instructions (Signed)
Indwelling Urinary Catheter Insertion, Care After This sheet gives you information about how to care for yourself after your procedure. Your health care provider may also give you more specific instructions. If you have problems or questions, contact your health care provider. What can I expect after the procedure? After the procedure, it is common to have:  Slight discomfort around your urethra where the catheter enters your body.  Follow these instructions at home:  Keep the drainage bag at or below the level of your bladder. Doing this ensures that urine can only drain out, not back into your body.  Secure the catheter tubing and drainage bag to your leg or thigh to keep it from moving.  Check the catheter tubing regularly to make sure there are no kinks or blockages.  Take showers daily to keep the catheter clean. Do not take a bath.  Do not pull on your catheter or try to remove it.  Disconnect the tubing and drainage bag as little as possible.  Empty the drainage bag every 2-4 hours, or more often if needed. Do not let the bag get completely full.  Wash your hands with soap and water before and after touching the catheter, tubing, or drainage bag.  Do not let the drainage bag or catheter tubing touch the floor.  Drink enough fluids to keep your urine clear or pale yellow, or as told by your health care provider. Contact a health care provider if:  Urine stops flowing into the drainage bag.  You feel pain or pressure in the bladder area.  You have back pain.  Your catheter gets clogged.  Your catheter starts to leak.  Your urine looks cloudy.  Your drainage bag or tubing looks dirty.  You notice a bad smell when emptying your drainage bag. Get help right away if:  You have a fever or chills.  You have severe pain in your back or your lower abdomen.  You have warmth, redness, swelling, or pain in the urethra area.  You notice blood in your urine.  Your  catheter gets pulled out. Summary  Do not pull on your catheter or try to remove it.  Keep the drainage bag at or below the level of your bladder, but do not let the drainage bag or catheter tubing touch the floor.  Wash your hands with soap and water before and after touching the catheter, tubing, or drainage bag.  Contact your health care provider if you have a fever, chills, or any other signs of infection. This information is not intended to replace advice given to you by your health care provider. Make sure you discuss any questions you have with your health care provider. Document Released: 11/13/2016 Document Revised: 11/13/2016 Document Reviewed: 11/13/2016 Elsevier Interactive Patient Education  2018 [place] Anesthesia, Adult, Care After These instructions provide you with information about caring for yourself after your procedure. Your health care provider may also give you more specific instructions. Your treatment has been planned according to current medical practices, but problems sometimes occur. Call your health care provider if you have any problems or questions after your procedure. What can I expect after the procedure? After the procedure, it is common to have:  Vomiting.  A sore throat.  Mental slowness.  It is common to feel:  Nauseous.  Cold or shivery.  Sleepy.  Tired.  Sore or achy, even in parts of your body where you did not have surgery.  Follow these instructions  at home: For at least 24 hours after the procedure:  Do not: ? Participate in activities where you could fall or become injured. ? Drive. ? Use heavy machinery. ? Drink alcohol. ? Take sleeping pills or medicines that cause drowsiness. ? Make important decisions or sign legal documents. ? Take care of children on your own.  Rest. Eating and drinking  If you vomit, drink water, juice, or soup when you can drink without vomiting.  Drink enough fluid to keep  your urine clear or pale yellow.  Make sure you have little or no nausea before eating solid foods.  Follow the diet recommended by your health care provider. General instructions  Have a responsible adult stay with you until you are awake and alert.  Return to your normal activities as told by your health care provider. Ask your health care provider what activities are safe for you.  Take over-the-counter and prescription medicines only as told by your health care provider.  If you smoke, do not smoke without supervision.  Keep all follow-up visits as told by your health care provider. This is important. Contact a health care provider if:  You continue to have nausea or vomiting at home, and medicines are not helpful.  You cannot drink fluids or start eating again.  You cannot urinate after 8-12 hours.  You develop a skin rash.  You have fever.  You have increasing redness at the site of your procedure. Get help right away if:  You have difficulty breathing.  You have chest pain.  You have unexpected bleeding.  You feel that you are having a life-threatening or urgent problem. This information is not intended to replace advice given to you by your health care provider. Make sure you discuss any questions you have with your health care provider. Document Released: 01/10/2001 Document Revised: 03/08/2016 Document Reviewed: 09/18/2015 Elsevier Interactive Patient Education  2018 [employer].     Transurethral Resection of Bladder Tumor (TURBT)   Definition:  Transurethral Resection of the Bladder Tumor is a surgical procedure used to diagnose and remove tumors within the bladder. TURBT is the most common treatment for early stage bladder cancer.  General instructions:     Your recent bladder surgery requires very little post hospital care but some definite precautions.  Despite the fact that no skin incisions were used, the area around the bladder incisions are  raw and covered with scabs to promote healing and prevent bleeding. Certain precautions are needed to insure that the scabs are not disturbed over the next 2-4 weeks while the healing proceeds.  Because the raw surface inside your bladder and the irritating effects of urine you may expect frequency of urination and/or urgency (a stronger desire to urinate) and perhaps even getting up at night more often. This will usually resolve or improve slowly over the healing period. You may see some blood in your urine over the first 6 weeks. Do not be alarmed, even if the urine was clear for a while. Get off your feet and drink lots of fluids until clearing occurs. If you start to pass clots or don't improve call us.  Catheter: (If you are discharged with a catheter.)  1. Keep your catheter secured to your leg at all times with tape or the supplied strap. 2. You may experience leakage of urine around your catheter- as long as the  catheter continues to drain, this is normal.  If your catheter stops draining  go to the ER. 3.  You may also have blood in your urine, even after it has been clear for  several days; you may even pass some small blood clots or other material.  This  is normal as well.  If this happens, sit down and drink plenty of water to help  make urine to flush out your bladder.  If the blood in your urine becomes worse  after doing this, contact our office or return to the ER. 4. You may use the leg bag (small bag) during the day, but use the large bag at  night.  Diet:  You may return to your normal diet immediately. Because of the raw surface of your bladder, alcohol, spicy foods, foods high in acid and drinks with caffeine may cause irritation or frequency and should be used in moderation. To keep your urine flowing freely and avoid constipation, drink plenty of fluids during the day (8-10 glasses). Tip: Avoid cranberry juice because it is very acidic.  Activity:  Your physical  activity doesn't need to be restricted. However, if you are very active, you may see some blood in the urine. We suggest that you reduce your activity under the circumstances until the bleeding has stopped.  Bowels:  It is important to keep your bowels regular during the postoperative period. Straining with bowel movements can cause bleeding. A bowel movement every other day is reasonable. Use a mild laxative if needed, such as milk of magnesia 2-3 tablespoons, or 2 Dulcolax tablets. Call if you continue to have problems. If you had been taking narcotics for pain, before, during or after your surgery, you may be constipated. Take a laxative if necessary.    Medication:  You should resume your pre-surgery medications unless told not to. In addition you may be given an antibiotic to prevent or treat infection. Antibiotics are not always necessary. All medication should be taken as prescribed until the bottles are finished unless you are having an unusual reaction to one of the drugs.

## 2017-04-25 NOTE — Anesthesia Procedure Notes (Signed)
Procedure Name: LMA Insertion Date/Time: 04/25/2017 7:33 AM Performed by: [place] Twylah Bennetts C Pre-anesthesia Checklist: Patient identified, Emergency Drugs available, Suction available and Patient being monitored Patient Re-evaluated:Patient Re-evaluated prior to inductionOxygen Delivery Method: Circle system utilized Preoxygenation: Pre-oxygenation with 100% oxygen Intubation Type: IV induction Ventilation: Mask ventilation without difficulty LMA: LMA inserted LMA Size: 4.0 Number of attempts: 1 Airway Equipment and Method: Bite block Placement Confirmation: positive ETCO2 Tube secured with: Tape Dental Injury: Teeth and Oropharynx as per pre-operative assessment

## 2017-04-25 NOTE — Transfer of Care (Signed)
Immediate Anesthesia Transfer of Care Note  Patient: [name]  Procedure(s) Performed: Procedure(s): CYSTOSCOPY WITH BLADDER BIOPSY (N/A) TRANSURETHRAL RESECTION OF THE PROSTATE (TURP) (N/A)  Patient Location: PACU  Anesthesia Type:General  Level of Consciousness: awake, alert  and oriented  Airway & Oxygen Therapy: Patient Spontanous Breathing and Patient connected to face mask oxygen  Post-op Assessment: Report given to RN and Post -op Vital signs reviewed and stable  Post vital signs: Reviewed and stable  Last Vitals:  Vitals:   04/25/17 0520  BP: (!) 141/55  Pulse: 61  Resp: 16  Temp: 36.8 C    Last Pain:  Vitals:   04/25/17 0520  TempSrc: Oral      Patients Stated Pain Goal: 4 (93/57/01 [id number])  Complications: No apparent anesthesia complications

## 2017-04-25 NOTE — Progress Notes (Signed)
Dr. Karsten Ro notified that patient is feeling much better- discomfort on pain scale a 3-O.K. To go to Short Stay for discharge.

## 2017-04-25 NOTE — Progress Notes (Signed)
Dr. Karsten Ro in - checked patient- Orders given

## 2017-04-25 NOTE — Op Note (Signed)
PATIENT:  [name]  PRE-OPERATIVE DIAGNOSIS: 1. Bladder tumor 2. Right hydronephrosis 3. Intermittent gross hematuria with self-catheterization  POST-OPERATIVE DIAGNOSIS: Same  PROCEDURE:  Procedure(s): 1. TRANSURETHRAL RESECTION OF BLADDER TUMOR (TURBT) (4cm.) 2. Transurethral incision of the prostate   SURGEON:  Surgeon(s): Claybon Jabs  ANESTHESIA:   General  EBL:  less than 50 mL  DRAINS: Urethral catheter (22 Fr. Foley)   SPECIMEN: 1. Bladder tumor 2. 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 Repeat bladder biopsy after BCG revealed only Ta,G3 and he was followed with surveillance cystoscopy and imaging of the pelvis with CT scan. In 12/17 a CT scan revealed no evidence of recurrence extravesically with an apparent diverticulum present with no lesion. Cystoscopically I was not able to visualize the diverticulum but noted no evidence of recurrence in the bladder. The same was true 3 months ago at the time of cystoscopy but recently he was noted to have what appeared to be a fibrinous material on the right side of the bladder and I repeated his CT scan which revealed recurrence in the area of the diverticulum right wall of the bladder with development of right hydronephrosis. He's brought to the operating room today for resection of his bladder tumor and also a transurethral incision of his prostate since he had a high bladder neck and was experiencing some difficulty with self-catheterization resulting in intermittent gross hematuria. Because his CT scan also showed some right hydronephrosis that  has developed I also discussed possible ureteroscopy and stent placement if the right ureteral orifice could be identified.  Description of operation: The patient was taken to the operating room and administered general anesthesia. They were then placed on the table and moved to the dorsal lithotomy position after which the genitalia was sterilely prepped and draped. An official timeout was then performed. He received one ampule of indigo carmine in order to aid in identification of his right ureteral orifice.  The 72 French resectoscope with the 30 lens and visual obturator were then passed into the bladder under direct visualization. Urethra appeared normal. The visual obturator was then removed and the Gyrus resectoscope element with 30  lens was then inserted and the bladder was fully and systematically inspected. I noted a large tumor involving the right wall of the bladder and over to about the midline and possibly just slightly to the left of midline extending about halfway back to the posterior wall of the bladder and up to the bladder neck. No right ureteral orifice could be identified.  I first began by resecting the tumor starting posteriorly and resected the tumor from posterior to anterior and also from the right wall as much as possible. I then resected the floor of the bladder while attempting to visualize any indigo carmine as I did this. I was able to resect a large portion of the bladder tumor and eventually identified his bladder diverticulum. I entered the diverticulum and also resected tumor from this location. As I was resecting did note some glistening fat that was associated with the tumor indicating very likely involvement of the perivesical tissue with cancer. Because  of this finding I resected as much bladder tumor as I could however I did not attempt to resect all visible tumor as it appeared to be almost certainly invasive especially with the presence of new hydronephrosis as  well. Throughout the procedure I attempted to identify his right ureteral orifice with hopes of performing a retrograde pyelogram or possibly ureteroscopy but was unable to identify the right UO.  Reinspection of the bladder revealed all significant bleeding points had been controlled with electrocautery and there was no evidence of perforation. The Microvasive evacuator was then used to irrigate the bladder and remove all of the portions of bladder tumor which were sent to pathology.  His bladder neck had been photographed and I therefore proceeded to perform a transurethral incision of the prostate from the bladder neck back to near the level of the veru opening up the prostate and developing a more direct route into the bladder for self-catheterization. These prostate chips were removed and sent to pathology. I then removed the resectoscope.  A 22 French Foley catheter was then inserted in the bladder and irrigated. The irrigant returned slightly pink with no clots. The patient was awakened and taken to the recovery room.   PLAN OF CARE: Discharge to home after PACU  PATIENT DISPOSITION:  PACU - hemodynamically stable.

## 2017-05-05 ENCOUNTER — Other Ambulatory Visit (HOSPITAL_COMMUNITY): Payer: Self-pay | Admitting: Urology

## 2017-05-05 DIAGNOSIS — C678 Malignant neoplasm of overlapping sites of bladder: Secondary | ICD-10-CM | POA: Diagnosis not present

## 2017-05-05 DIAGNOSIS — N1330 Unspecified hydronephrosis: Secondary | ICD-10-CM

## 2017-05-05 DIAGNOSIS — N130 Hydronephrosis with ureteropelvic junction obstruction: Secondary | ICD-10-CM | POA: Diagnosis not present

## 2017-05-10 ENCOUNTER — Other Ambulatory Visit: Payer: Self-pay | Admitting: Radiology

## 2017-05-10 ENCOUNTER — Other Ambulatory Visit: Payer: Self-pay | Admitting: Student

## 2017-05-11 ENCOUNTER — Encounter (HOSPITAL_COMMUNITY): Payer: Self-pay

## 2017-05-11 ENCOUNTER — Ambulatory Visit (HOSPITAL_COMMUNITY)
Admission: RE | Admit: 2017-05-11 | Discharge: 2017-05-11 | Disposition: A | Discharge status: Home / Self Care | Payer: Medicare Other | Source: Ambulatory Visit | Attending: Urology | Admitting: Urology

## 2017-05-11 ENCOUNTER — Other Ambulatory Visit (HOSPITAL_COMMUNITY): Payer: Self-pay | Admitting: Urology

## 2017-05-11 DIAGNOSIS — Z955 Presence of coronary angioplasty implant and graft: Secondary | ICD-10-CM | POA: Insufficient documentation

## 2017-05-11 DIAGNOSIS — Z794 Long term (current) use of insulin: Secondary | ICD-10-CM | POA: Insufficient documentation

## 2017-05-11 DIAGNOSIS — I252 Old myocardial infarction: Secondary | ICD-10-CM | POA: Diagnosis not present

## 2017-05-11 DIAGNOSIS — Z951 Presence of aortocoronary bypass graft: Secondary | ICD-10-CM | POA: Diagnosis not present

## 2017-05-11 DIAGNOSIS — I4891 Unspecified atrial fibrillation: Secondary | ICD-10-CM | POA: Diagnosis not present

## 2017-05-11 DIAGNOSIS — G4733 Obstructive sleep apnea (adult) (pediatric): Secondary | ICD-10-CM | POA: Diagnosis not present

## 2017-05-11 DIAGNOSIS — K219 Gastro-esophageal reflux disease without esophagitis: Secondary | ICD-10-CM | POA: Diagnosis not present

## 2017-05-11 DIAGNOSIS — M1990 Unspecified osteoarthritis, unspecified site: Secondary | ICD-10-CM | POA: Diagnosis not present

## 2017-05-11 DIAGNOSIS — Z7982 Long term (current) use of aspirin: Secondary | ICD-10-CM | POA: Insufficient documentation

## 2017-05-11 DIAGNOSIS — I471 Supraventricular tachycardia: Secondary | ICD-10-CM | POA: Insufficient documentation

## 2017-05-11 DIAGNOSIS — G8929 Other chronic pain: Secondary | ICD-10-CM | POA: Diagnosis not present

## 2017-05-11 DIAGNOSIS — M353 Polymyalgia rheumatica: Secondary | ICD-10-CM | POA: Insufficient documentation

## 2017-05-11 DIAGNOSIS — I2510 Atherosclerotic heart disease of native coronary artery without angina pectoris: Secondary | ICD-10-CM | POA: Diagnosis not present

## 2017-05-11 DIAGNOSIS — E785 Hyperlipidemia, unspecified: Secondary | ICD-10-CM | POA: Insufficient documentation

## 2017-05-11 DIAGNOSIS — N1330 Unspecified hydronephrosis: Secondary | ICD-10-CM | POA: Diagnosis not present

## 2017-05-11 DIAGNOSIS — C678 Malignant neoplasm of overlapping sites of bladder: Secondary | ICD-10-CM

## 2017-05-11 DIAGNOSIS — I5042 Chronic combined systolic (congestive) and diastolic (congestive) heart failure: Secondary | ICD-10-CM | POA: Insufficient documentation

## 2017-05-11 DIAGNOSIS — E1122 Type 2 diabetes mellitus with diabetic chronic kidney disease: Secondary | ICD-10-CM | POA: Insufficient documentation

## 2017-05-11 DIAGNOSIS — Z8249 Family history of ischemic heart disease and other diseases of the circulatory system: Secondary | ICD-10-CM | POA: Insufficient documentation

## 2017-05-11 DIAGNOSIS — J449 Chronic obstructive pulmonary disease, unspecified: Secondary | ICD-10-CM | POA: Insufficient documentation

## 2017-05-11 DIAGNOSIS — M109 Gout, unspecified: Secondary | ICD-10-CM | POA: Diagnosis not present

## 2017-05-11 DIAGNOSIS — I130 Hypertensive heart and chronic kidney disease with heart failure and stage 1 through stage 4 chronic kidney disease, or unspecified chronic kidney disease: Secondary | ICD-10-CM | POA: Insufficient documentation

## 2017-05-11 DIAGNOSIS — M545 Low back pain: Secondary | ICD-10-CM | POA: Diagnosis not present

## 2017-05-11 DIAGNOSIS — I6523 Occlusion and stenosis of bilateral carotid arteries: Secondary | ICD-10-CM | POA: Insufficient documentation

## 2017-05-11 DIAGNOSIS — Z87891 Personal history of nicotine dependence: Secondary | ICD-10-CM | POA: Insufficient documentation

## 2017-05-11 DIAGNOSIS — N183 Chronic kidney disease, stage 3 (moderate): Secondary | ICD-10-CM | POA: Insufficient documentation

## 2017-05-11 DIAGNOSIS — N135 Crossing vessel and stricture of ureter without hydronephrosis: Secondary | ICD-10-CM | POA: Diagnosis not present

## 2017-05-11 DIAGNOSIS — Z7902 Long term (current) use of antithrombotics/antiplatelets: Secondary | ICD-10-CM | POA: Insufficient documentation

## 2017-05-11 DIAGNOSIS — Z880 Allergy status to penicillin: Secondary | ICD-10-CM | POA: Diagnosis not present

## 2017-05-11 DIAGNOSIS — C679 Malignant neoplasm of bladder, unspecified: Secondary | ICD-10-CM | POA: Diagnosis not present

## 2017-05-11 DIAGNOSIS — R918 Other nonspecific abnormal finding of lung field: Secondary | ICD-10-CM | POA: Diagnosis not present

## 2017-05-11 HISTORY — PX: IR NEPHROSTOMY PLACEMENT RIGHT: IMG6064

## 2017-05-11 LAB — CBC WITH DIFFERENTIAL/PLATELET
BASOS ABS: 0.1 10*3/uL (ref 0.0–0.1)
BASOS PCT: 1 %
EOS ABS: 0.3 10*3/uL (ref 0.0–0.7)
Eosinophils Relative: 3 %
HCT: 36.8 % — ABNORMAL LOW (ref 39.0–52.0)
HEMOGLOBIN: 12.5 g/dL — AB (ref 13.0–17.0)
Lymphocytes Relative: 20 %
Lymphs Abs: 2 10*3/uL (ref 0.7–4.0)
MCH: 27.6 pg (ref 26.0–34.0)
MCHC: 34 g/dL (ref 30.0–36.0)
MCV: 81.2 fL (ref 78.0–100.0)
MONO ABS: 1 10*3/uL (ref 0.1–1.0)
MONOS PCT: 10 %
NEUTROS PCT: 66 %
Neutro Abs: 6.7 10*3/uL (ref 1.7–7.7)
Platelets: 234 10*3/uL (ref 150–400)
RBC: 4.53 MIL/uL (ref 4.22–5.81)
RDW: 14.4 % (ref 11.5–15.5)
WBC: 10 10*3/uL (ref 4.0–10.5)

## 2017-05-11 LAB — PROTIME-INR
INR: 0.91
PROTHROMBIN TIME: 12.3 s (ref 11.4–15.2)

## 2017-05-11 LAB — BASIC METABOLIC PANEL
Anion gap: 9 (ref 5–15)
BUN: 44 mg/dL — ABNORMAL HIGH (ref 6–20)
CALCIUM: 9.5 mg/dL (ref 8.9–10.3)
CO2: 29 mmol/L (ref 22–32)
CREATININE: 1.93 mg/dL — AB (ref 0.61–1.24)
Chloride: 100 mmol/L — ABNORMAL LOW (ref 101–111)
GFR, EST AFRICAN AMERICAN: 37 mL/min — AB (ref >60)
GFR, EST NON AFRICAN AMERICAN: 32 mL/min — AB (ref >60)
Glucose, Bld: 283 mg/dL — ABNORMAL HIGH (ref 65–99)
Potassium: 4 mmol/L (ref 3.5–5.1)
Sodium: 138 mmol/L (ref 135–145)

## 2017-05-11 LAB — GLUCOSE, CAPILLARY: Glucose-Capillary: 305 mg/dL — ABNORMAL HIGH (ref 65–99)

## 2017-05-11 MED ORDER — IOPAMIDOL (ISOVUE-300) INJECTION 61%
INTRAVENOUS | Status: AC
  Administered 2017-05-11: 10 mL via INTRAVENOUS
  Filled 2017-05-11: qty 50

## 2017-05-11 MED ORDER — IOPAMIDOL (ISOVUE-300) INJECTION 61%
50.0000 mL | Freq: Once | INTRAVENOUS | Status: AC | PRN
  Administered 2017-05-11: 10 mL via INTRAVENOUS

## 2017-05-11 MED ORDER — MIDAZOLAM HCL 2 MG/2ML IJ SOLN
INTRAMUSCULAR | Status: AC | PRN
  Administered 2017-05-11: 1 mg via INTRAVENOUS

## 2017-05-11 MED ORDER — FENTANYL CITRATE (PF) 100 MCG/2ML IJ SOLN
INTRAMUSCULAR | Status: AC | PRN
  Administered 2017-05-11: 50 ug via INTRAVENOUS

## 2017-05-11 MED ORDER — MIDAZOLAM HCL 2 MG/2ML IJ SOLN
INTRAMUSCULAR | Status: AC
  Filled 2017-05-11: qty 4

## 2017-05-11 MED ORDER — SODIUM CHLORIDE 0.9 % IV SOLN
INTRAVENOUS | Status: DC
  Administered 2017-05-11: 12:00:00 via INTRAVENOUS

## 2017-05-11 MED ORDER — LIDOCAINE HCL 1 % IJ SOLN
INTRAMUSCULAR | Status: AC | PRN
  Administered 2017-05-11: 15 mL

## 2017-05-11 MED ORDER — LIDOCAINE HCL 1 % IJ SOLN
INTRAMUSCULAR | Status: AC
  Filled 2017-05-11: qty 20

## 2017-05-11 MED ORDER — FENTANYL CITRATE (PF) 100 MCG/2ML IJ SOLN
INTRAMUSCULAR | Status: AC
  Filled 2017-05-11: qty 4

## 2017-05-11 MED ORDER — HYDROCODONE-ACETAMINOPHEN 5-325 MG PO TABS: 1.0000 | ORAL_TABLET | ORAL | Status: DC | PRN

## 2017-05-11 MED ORDER — CIPROFLOXACIN IN D5W 400 MG/200ML IV SOLN
400.0000 mg | INTRAVENOUS | Status: AC
  Administered 2017-05-11: 400 mg via INTRAVENOUS

## 2017-05-11 MED ORDER — DIPHENHYDRAMINE HCL 50 MG/ML IJ SOLN
INTRAMUSCULAR | Status: AC | PRN
  Administered 2017-05-11: 25 mg via INTRAVENOUS

## 2017-05-11 MED ORDER — CIPROFLOXACIN IN D5W 400 MG/200ML IV SOLN
INTRAVENOUS | Status: AC
  Filled 2017-05-11: qty 200

## 2017-05-11 MED ORDER — DIPHENHYDRAMINE HCL 50 MG/ML IJ SOLN
INTRAMUSCULAR | Status: AC
  Filled 2017-05-11: qty 1

## 2017-05-11 NOTE — Consult Note (Signed)
Chief Complaint: Patient was seen in consultation today for right percutaneous nephrostomy  Referring Physician(s): Kathie Rhodes  Supervising Physician: Aletta Edouard  Patient Status: [facility] - Out-pt  History of Present Illness: [name] is a 76 y.o. male with history of recurrent bladder cancer, intermittent hematuria, CKD,  prior TURBT's in 2017 and most recently on 04/25/17. Recent imaging has also revealed right hydronephrosis. The right ureteral orifice was not able to be visualized with last operative procedure. He presents today for right percutaneous nephrostomy. He currently self catheterizes himself at each voiding session.  Past Medical History:  Diagnosis Date  . Anemia    a. + FOBT 08/2014.  Marland Kitchen Anginal pain ([place])    comes and goes; uses nitro to relieve   . Arthritis   . Atherosclerosis of native arteries of the extremities with intermittent claudication    a. L external iliac stent 2001, bilateral SFA occlusions documented 2001.  . Atrial fibrillation ([place])   . Bladder cancer ([place])   . CAD (coronary artery disease), native coronary artery 09/24/2013   a. CABG x 6 1999. b. Interim PCIs: Angioplasty SVG-PDA 2006.Taxus DES to native LCx in 2007.DES to SVG-PDA 2007, restented 2009.NSTEMI with PCI to SVG- PDA complicated by no-reflow/inferior infarction in 2012. c. Redo CABG 2015 with SVG-PD, SVG-D1, SVG-OM1.  . Carotid artery occlusion    a. Duplex 12/2013: mild plaque bilat, 40-59% BICA. F/u due 12/2014.  Marland Kitchen Chronic combined systolic and diastolic CHF (congestive heart failure) ([place])    a. Prior EF 45-50% in 08/2014. b. Improved EF >55% in 09/2014 at [facility].  . Chronic lower back pain   . CKD (chronic kidney disease) stage 3, GFR 30-59 ml/min   . COPD (chronic obstructive pulmonary disease) ([place])    pt states he doesn't have  . Daily headache    comes and goes pt denies daily currently   . Dysrhythmia   . Esophageal stricture    a. History of dilitation -  Long-standing history of esophageal reflux   . Gait instability   . GERD (gastroesophageal reflux disease)   . Gout   . History of bronchitis   . History of gout   . History of hiatal hernia   . Hx of CABG 09/24/2013   a. 1999: LIMA to LAD, sequential SVG to diagonal 2 and diagonal 3, sequential SVG to PDA, acute marginal branch, and PL branch. b. Repeat CABG 2015 with SVG to D1, SVG to PDA, and SVG to OM.   Marland Kitchen Hyperlipidemia   . Hypertension   . Hypotonic bladder    Requiring in and out catheterization performed by the patient at home - occasional blood tinged urine (self-cath's daily for the last 2 years) and hematuria is chronic for him, followed by urology.  . MI (myocardial infarction) ([place]) 1999-02/2014   total of 7 MI per patient  . OSA (obstructive sleep apnea)    "don't wear mask"   . Polymyalgia rheumatica ([place])   . Self-catheterizes urinary bladder   . Shortness of breath dyspnea   . Skin cancer   . SVT (supraventricular tachycardia) ([place])   . Type II diabetes mellitus ([place])     Past Surgical History:  Procedure Laterality Date  . ABDOMINAL AORTAGRAM N/A 11/27/2014   Procedure: ABDOMINAL Maxcine Ham;  Surgeon: Wellington Hampshire, MD;  Location: [place] CATH LAB;  Service: Cardiovascular;  Laterality: N/A;  . ANKLE FRACTURE SURGERY Right ~ 1977   "10 plates and 7 screws placed in it"  .  ANTERIOR CERVICAL DECOMP/DISCECTOMY FUSION    . BACK SURGERY     neck surgery x 2  . CARDIAC CATHETERIZATION    . CATARACT EXTRACTION W/ INTRAOCULAR LENS  IMPLANT, BILATERAL Bilateral 2000's  . COLONOSCOPY    . CORONARY ARTERY BYPASS GRAFT  1999  . CORONARY ARTERY BYPASS GRAFT N/A 03/01/2014   Procedure: REDO CORONARY ARTERY BYPASS GRAFTING TIMES THREE, USING RIGHT GREATER SAPHENOUS VEIN VIA ENDOVEIN HARVEST.;  Surgeon: Gaye Pollack, MD;  Location: [facility] OR;  Service: Open Heart Surgery;  Laterality: N/A;  . CORONARY STENT PLACEMENT     "I've had 8 put in; I presently have none" (08/26/2014)  .  CYSTOSCOPY W/ RETROGRADES Bilateral 03/03/2015   Procedure: CYSTOSCOPY WITH LEFT RETROGRADE PYELOGRAM;  Surgeon: Kathie Rhodes, MD;  Location: [facility] ORS;  Service: Urology;  Laterality: Bilateral;  . CYSTOSCOPY WITH BIOPSY N/A 04/25/2017   Procedure: CYSTOSCOPY WITH BLADDER BIOPSY;  Surgeon: Kathie Rhodes, MD;  Location: [facility] ORS;  Service: Urology;  Laterality: N/A;  . ENDARTERECTOMY Left 01/30/2015   Procedure: ENDARTERECTOMY CAROTID WITH PRIMARY CLOSURE OF ARTERY;  Surgeon: Angelia Mould, MD;  Location: [facility] OR;  Service: Vascular;  Laterality: Left;  . ESOPHAGOGASTRODUODENOSCOPY (EGD) WITH ESOPHAGEAL DILATION  X 1  . EYE SURGERY     cataract surgery bilat   . FRACTURE SURGERY Right    ankle  . INTRAOPERATIVE TRANSESOPHAGEAL ECHOCARDIOGRAM N/A 03/01/2014   Procedure: INTRAOPERATIVE TRANSESOPHAGEAL ECHOCARDIOGRAM;  Surgeon: Gaye Pollack, MD;  Location: [facility] OR;  Service: Open Heart Surgery;  Laterality: N/A;  . LEFT HEART CATHETERIZATION WITH CORONARY/GRAFT ANGIOGRAM  02/26/2014   Procedure: LEFT HEART CATHETERIZATION WITH Beatrix Fetters;  Surgeon: Sinclair Grooms, MD;  Location: [facility] CATH LAB;  Service: Cardiovascular;;  . LEFT HEART CATHETERIZATION WITH CORONARY/GRAFT ANGIOGRAM N/A 07/23/2014   Procedure: LEFT HEART CATHETERIZATION WITH Beatrix Fetters;  Surgeon: Sinclair Grooms, MD;  Location: [facility] CATH LAB;  Service: Cardiovascular;  Laterality: N/A;  . LEFT HEART CATHETERIZATION WITH CORONARY/GRAFT ANGIOGRAM N/A 08/30/2014   Procedure: LEFT HEART CATHETERIZATION WITH Beatrix Fetters;  Surgeon: Leonie Man, MD;  Location: [facility] CATH LAB;  Service: Cardiovascular;  Laterality: N/A;  . MOHS SURGERY  X 2   "nose; nose"  . MULTIPLE TOOTH EXTRACTIONS    . SKIN CANCER EXCISION     "all over my head; ears; back  . TONSILLECTOMY    . TRANSURETHRAL RESECTION OF BLADDER TUMOR WITH GYRUS (TURBT-GYRUS) N/A 03/03/2015   Procedure: TRANSURETHRAL RESECTION OF BLADDER TUMOR;   Surgeon: Kathie Rhodes, MD;  Location: [facility];  Service: Urology;  Laterality: N/A;  . TRANSURETHRAL RESECTION OF BLADDER TUMOR WITH GYRUS (TURBT-GYRUS) N/A 04/28/2015   Procedure: TRANSURETHRAL RESECTION OF BLADDER TUMOR ;  Surgeon: Kathie Rhodes, MD;  Location: [facility];  Service: Urology;  Laterality: N/A;  . TRANSURETHRAL RESECTION OF BLADDER TUMOR WITH GYRUS (TURBT-GYRUS) N/A 11/24/2015   Procedure: TRANSURETHRAL RESECTION OF BLADDER TUMOR WITH GYRUS (TURBT-GYRUS);  Surgeon: Kathie Rhodes, MD;  Location: [facility];  Service: Urology;  Laterality: N/A;  . TRANSURETHRAL RESECTION OF PROSTATE N/A 04/25/2017   Procedure: TRANSURETHRAL RESECTION OF THE PROSTATE (TURP);  Surgeon: Kathie Rhodes, MD;  Location: [facility];  Service: Urology;  Laterality: N/A;    Allergies: Other and Penicillins  Medications: Prior to Admission medications   Medication Sig Start Date End Date Taking? Authorizing Provider  acetaminophen (TYLENOL) 500 MG tablet Take 1,000 mg by mouth every 8 (eight) hours as needed for moderate pain.  [provider]  allopurinol (ZYLOPRIM) 100 MG tablet Take 100 mg by mouth 2 (two) times daily.  08/18/16   [provider]  amLODipine (NORVASC) 10 MG tablet Take 10 mg by mouth every morning.     [provider]  aspirin EC 81 MG EC tablet Take 1 tablet (81 mg total) by mouth daily. 11/19/14   Wellington Hampshire, MD  atorvastatin (LIPITOR) 20 MG tablet Take 20 mg by mouth daily at 6 PM.     [provider]  cholecalciferol (VITAMIN D) 400 units TABS tablet Take 400 Units by mouth daily.    [provider]  ciprofloxacin (CIPRO) 250 MG tablet Take 250 mg by mouth See admin instructions. Take 1 tablet twice daily until 04/17/17 then take 1 tablet once daily by mouth until procedure 03/08/17   [provider]  clopidogrel (PLAVIX) 75 MG tablet TAKE 1 TABLET BY MOUTH DAILY 02/23/17   Belva Crome, MD  colchicine 0.6 MG tablet Take 0.6 mg by mouth 2  (two) times daily.  01/27/17   [provider]  Ferrous Gluconate (IRON 27 PO) Take 1 tablet by mouth every evening.    [provider]  GLIPIZIDE XL 10 MG 24 hr tablet Take 10 mg by mouth 2 (two) times daily. 12/09/15   [provider]  hydrALAZINE (APRESOLINE) 50 MG tablet TAKE 1 TABLET BY MOUTH THREE TIMES DAILY AS NEEDED(WILL ONLY TAKE WHEN BLOOD PRESSURE IS RUNNING HIGH. WILL AT LEAST TAKE ONCE DAILY). 01/19/17   Belva Crome, MD  HYDROcodone-acetaminophen [facility]) 10-325 MG tablet Take 1-2 tablets by mouth every 4 (four) hours as needed for moderate pain. Maximum dose per 24 hours - 8 pills 04/25/17   Kathie Rhodes, MD  isosorbide mononitrate (IMDUR) 120 MG 24 hr tablet Take 1 tablet (120 mg total) by mouth daily. 01/31/17   Belva Crome, MD  LANTUS SOLOSTAR 100 UNIT/ML Solostar Pen Inject 10 Units into the skin daily.  04/01/17   [provider]  magnesium oxide (MAG-OX) 400 (241.3 Mg) MG tablet Take 400 mg by mouth every evening.     [provider]  metoprolol tartrate (LOPRESSOR) 100 MG tablet Take 0.5 tablets (50 mg total) by mouth 2 (two) times daily. 03/22/17   Wellington Hampshire, MD  nitroGLYCERIN (NITROSTAT) 0.4 MG SL tablet PLACE 1 TABLET UNDER THE TONGUE EVERY 5 MINUTES AS NEEDED FOR CHEST PAIN 08/27/16   Belva Crome, MD  pantoprazole (PROTONIX) 40 MG tablet Take 1 tablet (40 mg total) by mouth daily. Patient taking differently: Take 40 mg by mouth every other day.  03/07/17   Belva Crome, MD  phenazopyridine (PYRIDIUM) 200 MG tablet Take 1 tablet (200 mg total) by mouth 3 (three) times daily as needed for pain. 04/25/17   Kathie Rhodes, MD  torsemide (DEMADEX) 20 MG tablet TAKE 2 TABLETS BY MOUTH EVERY DAY 03/21/17   Belva Crome, MD     Family History  Problem Relation Age of Onset  . CAD Mother   . Heart disease Mother   . Hypertension Mother   . Cirrhosis Brother   . Heart disease Father   . Hyperlipidemia Father   . Hypertension  Father   . Heart attack Father     Social History   Social History  . Marital status: Married    Spouse name: N/A  . Number of children: N/A  . Years of education: N/A   Social History Main Topics  .  Smoking status: Former Smoker    Packs/day: 2.00    Years: 50.00    Types: Cigarettes    Quit date: 09/18/2015  . Smokeless tobacco: Never Used  . 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 Drug use: No  . Sexual activity: Not on file   Other Topics Concern  . Not on file   Social History Narrative   Tour manager             Review of Systems he currently denies fever , headache, chest pain, dyspnea, cough, nausea, vomiting or bleeding at the moment. He does have intermittent abdominal cramping as well as back pain.  Vital Signs: BP (!) 180/55 (BP Location: Left Arm)   Pulse (!) 57   Temp (!) 97.5 F (36.4 C) (Oral)   Resp 18   SpO2 99%   Physical Exam awake, alert. Chest with distant breath sounds bilaterally. Heart with slightly bradycardic but regular rhythm. Abdomen protuberant, soft, positive bowel sounds, mild periumbilical tenderness. No significant lower extremity edema.  Mallampati Score:     Imaging: No results found.  Labs:  CBC:  Recent Labs  04/21/17 0938  WBC 8.9  HGB 12.7*  HCT 37.2*  PLT 216    COAGS: No results for input(s): INR, APTT in the last 8760 hours.  BMP:  Recent Labs  04/21/17 0938  NA 137  K 5.2*  CL 103  CO2 29  GLUCOSE 264*  BUN 45*  CALCIUM 9.6  CREATININE 1.99*  GFRNONAA 31*  GFRAA 36*    LIVER FUNCTION TESTS: No results for input(s): BILITOT, AST, ALT, ALKPHOS, PROT, ALBUMIN in the last 8760 hours.  TUMOR MARKERS: No results for input(s): AFPTM, CEA, CA199, CHROMGRNA in the last 8760 hours.  Assessment and Plan: 76 y.o. male with history of recurrent bladder cancer, intermittent hematuria, CKD,  prior TURBT's in 2017 and most recently on  04/25/17. Recent imaging has also revealed right hydronephrosis. The right ureteral orifice was not able to be visualized with last operative procedure. He presents today for right percutaneous nephrostomy. He currently self catheterizes himself with each void. Risks and benefits discussed with the patient/family including, but not limited to infection, bleeding, significant bleeding causing loss or decrease in renal function or damage to adjacent structures. All of the patient's questions were answered, patient is agreeable to proceed.Consent signed and in chart.     Thank you for this interesting consult.  I greatly enjoyed meeting [name] and look forward to participating in their care.  A copy of this report was sent to the requesting provider on this date.  Electronically Signed: D. Rowe Robert, PA-C 05/11/2017, 12:04 PM   I spent a total of 30 minutes face to face in consultation with patient, greater than 50% of which was counseling/coordinating care for right percutaneous nephrostomy

## 2017-05-11 NOTE — Discharge Instructions (Signed)
Percutaneous Nephrostomy, Care After This sheet gives you information about how to care for yourself after your procedure. Your health care provider may also give you more specific instructions. If you have problems or questions, contact your health care provider. What can I expect after the procedure? After the procedure, it is common to have:  Some soreness where the nephrostomy tube was inserted (tube insertion site).  Blood-tinged drainage from the nephrostomy tube for the first 24 hours.  Follow these instructions at home: Activity  Return to your normal activities as told by your health care provider. Ask your health care provider what activities are safe for you.  Avoid activities that may cause the nephrostomy tubing to bend.  Do not take baths, swim, or use a hot tub until your health care provider approves. Ask your health care provider if you can take showers. Cover the nephrostomy tube dressing with a watertight covering when you take a shower.  Donot drive for 24 hours if you were given a medicine to help you relax (sedative). Care of the tube insertion site  Follow instructions from your health care provider about how to take care of your tube insertion site. Make sure you: ? Wash your hands with soap and water before you change your bandage (dressing). If soap and water are not available, use hand sanitizer. ? Change your dressing as told by your health care provider. Be careful not to pull on the tube while removing the dressing. ? When you change the dressing, wash the skin around the tube, rinse well, and pat the skin dry.  Check the tube insertion area every day for signs of infection. Check for: ? More redness, swelling, or pain. ? More fluid or blood. ? Warmth. ? Pus or a bad smell. Care of the nephrostomy tube and drainage bag  Always keep the tubing, the leg bag, or the bedside drainage bags below the level of the kidney so that your urine drains  freely.  When connecting your nephrostomy tube to a drainage bag, make sure that there are no kinks in the tubing and that your urine is draining freely. You may want to use an elastic bandage to wrap any exposed tubing that goes from the nephrostomy tube to any of the connecting tubes.  At night, you may want to connect your nephrostomy tube or the leg bag to a larger bedside drainage bag.  Follow instructions from your health care provider about how to empty or change the drainage bag.  Empty the drainage bag when it becomes ? full.  Replace the drainage bag and any extension tubing that is connected to your nephrostomy tube every 3 weeks or as often as told by your health care provider. Your health care provider will explain how to change the drainage bag and extension tubing. General instructions  Take over-the-counter and prescription medicines only as told by your health care provider.  Keep all follow-up visits as told by your health care provider. This is important. Contact a health care provider if:    IR after hours [phone] or Dr Karsten Ro   You have problems with any of the valves or tubing.  You have persistent pain or soreness in your back.  You have more redness, swelling, or pain around your tube insertion site.  You have more fluid or blood coming from your tube insertion site.  Your tube insertion site feels warm to the touch.  You have pus or a bad smell coming from your tube  insertion site.  You have increased urine output or you feel burning when urinating. Get help right away if:  You have pain in your abdomen during the first week.  You have chest pain or have trouble breathing.  You have a new appearance of blood in your urine.  You have a fever or chills.  You have back pain that is not relieved by your medicine.  You have decreased urine output.  Your nephrostomy tube comes out. This information is not intended to replace advice given to you  by your health care provider. Make sure you discuss any questions you have with your health care provider. Document Released: 05/27/2004 Document Revised: 07/16/2016 Document Reviewed: 07/16/2016 Elsevier Interactive Patient Education  [employer].  Per Dr. Kathlene Cote, continue to straight catheter for urine as directed.  Follow up with Dr. Karsten Ro.    Moderate Conscious Sedation, Adult, Care After These instructions provide you with information about caring for yourself after your procedure. Your health care provider may also give you more specific instructions. Your treatment has been planned according to current medical practices, but problems sometimes occur. Call your health care provider if you have any problems or questions after your procedure. What can I expect after the procedure? After your procedure, it is common:  To feel sleepy for several hours.  To feel clumsy and have poor balance for several hours.  To have poor judgment for several hours.  To vomit if you eat too soon.  Follow these instructions at home: For at least 24 hours after the procedure:   Do not: ? Participate in activities where you could fall or become injured. ? Drive. ? Use heavy machinery. ? Drink alcohol. ? Take sleeping pills or medicines that cause drowsiness. ? Make important decisions or sign legal documents. ? Take care of children on your own.  Rest. Eating and drinking  Follow the diet recommended by your health care provider.  If you vomit: ? Drink water, juice, or soup when you can drink without vomiting. ? Make sure you have little or no nausea before eating solid foods. General instructions  Have a responsible adult stay with you until you are awake and alert.  Take over-the-counter and prescription medicines only as told by your health care provider.  If you smoke, do not smoke without supervision.  Keep all follow-up visits as told by your health care provider.  This is important. Contact a health care provider if:  You keep feeling nauseous or you keep vomiting.  You feel light-headed.  You develop a rash.  You have a fever. Get help right away if:  You have trouble breathing. This information is not intended to replace advice given to you by your health care provider. Make sure you discuss any questions you have with your health care provider. Document Released: 07/25/2013 Document Revised: 03/08/2016 Document Reviewed: 01/24/2016 Elsevier Interactive Patient Education  [employer].

## 2017-05-11 NOTE — Procedures (Signed)
Interventional Radiology Procedure Note  Procedure:  Right percutaneous nephrostomy tube placement  Complications: None  Estimated Blood Loss: < 10 mL  10 Fr right PCN placed and formed in renal pelvis.  Connected to gravity drainage bag.  Venetia Night. Kathlene Cote, M.D Pager:  [phone]

## 2017-05-11 NOTE — Sedation Documentation (Signed)
Patient stated that his right arm (R Hand IV) started itching after IV cipro infusion began. Infusion stopped, Rowe Robert, PA paged. Per Lennette Bihari patient is on oral Cipro at home. Order received to give 25 mg IV benadryl and infuse IV cipro at a slower rate. Patient in agreement with this plan. Benadryl given and IV cipro resumed. Patient resting comfortably. VSS. Will continue to monitor. Roselyn Reef Amauria Younts,RN

## 2017-05-12 ENCOUNTER — Encounter: Payer: Self-pay | Admitting: Oncology

## 2017-05-12 ENCOUNTER — Encounter (HOSPITAL_COMMUNITY): Payer: Self-pay | Admitting: Interventional Radiology

## 2017-05-12 ENCOUNTER — Telehealth: Payer: Self-pay | Admitting: Oncology

## 2017-05-12 NOTE — Telephone Encounter (Signed)
Appt has been scheduled for the pt to see Dr. Alen Blew on 8/7 at 2pm. Pt aware to arrive 30 minutes early. Address and insurance verified. Letter mailed to the pt and faxed to the referring.

## 2017-05-16 ENCOUNTER — Other Ambulatory Visit: Payer: Self-pay | Admitting: Interventional Cardiology

## 2017-05-17 ENCOUNTER — Other Ambulatory Visit (HOSPITAL_COMMUNITY): Payer: Self-pay | Admitting: Urology

## 2017-05-17 DIAGNOSIS — N1330 Unspecified hydronephrosis: Secondary | ICD-10-CM

## 2017-05-24 ENCOUNTER — Ambulatory Visit (HOSPITAL_BASED_OUTPATIENT_CLINIC_OR_DEPARTMENT_OTHER): Payer: Medicare Other | Admitting: Oncology

## 2017-05-24 ENCOUNTER — Other Ambulatory Visit: Payer: Self-pay | Admitting: Radiology

## 2017-05-24 ENCOUNTER — Telehealth: Payer: Self-pay | Admitting: Oncology

## 2017-05-24 VITALS — BP 166/50 | HR 62 | Temp 97.4°F | Resp 18 | Ht 66.0 in | Wt 164.6 lb

## 2017-05-24 DIAGNOSIS — N189 Chronic kidney disease, unspecified: Secondary | ICD-10-CM

## 2017-05-24 DIAGNOSIS — E119 Type 2 diabetes mellitus without complications: Secondary | ICD-10-CM | POA: Diagnosis not present

## 2017-05-24 DIAGNOSIS — N1330 Unspecified hydronephrosis: Secondary | ICD-10-CM

## 2017-05-24 DIAGNOSIS — C679 Malignant neoplasm of bladder, unspecified: Secondary | ICD-10-CM

## 2017-05-24 NOTE — Telephone Encounter (Signed)
Scheduled appt per 8/7 los - Gave patient AVS and calender per los.  

## 2017-05-24 NOTE — Progress Notes (Signed)
Reason for Referral: Bladder cancer.   HPI: 76 year old gentleman currently resides in [place] but lived around this area the majority of his life. He is a gentleman with history of diabetes, coronary disease as well as chronic renal insufficiency. He was diagnosed with superficial bladder tumor under the care of Dr. Karsten Ro that required transurethral resection of bladder tumors in the past on multiple occasions. His initial diagnosis was in May 2016 with a T1 lesion. He was treated with BCG at that time. CT scan in December 2017 showed bladder diverticulum at that time. A repeat CT scan showed a recurrence in the area of the diverticulum along the right bladder wall and development of right hydronephrosis. His most recent cystoscopy in 05/30/2017 showed large tumor involving the right wall of the bladder extending halfway back to the posterior wall of the bladder up to the bladder neck. 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 There was invasion with urothelial carcinoma into the prostate. His most recent imaging studies include a chest x-ray in 2017/05/30 which showed no abnormalities. His CT scan of the pelvis in December 2017 showed a local recurrence of his bladder cancer in the posterior right aspect of the bladder around the vesicoureteral junction. No pelvic adenopathy was noted. This was CT scan done without contrast. 05/11/2017 he underwent a percutaneous nephrostomy tube placement on the right side which she has tolerated it well.  Clinically, he reports some fatigue and tiredness associated with all these procedures. He also reported symptoms of lower back pain and slight fatigue. He does drive short distances and able to attend to most activities of daily living. His performance status remains adequate. He continues to self catheterize with some occasional hematuria. No hematuria  noted in his nephrostomy bag. He denied any chest pain or dyspnea on exertion. He denied any weight loss or major appetite changes.  He does not report any headaches, blurry vision, syncope or seizures. He does not report any fevers, chills or sweats or weight loss. He does not report any chest pain, palpitation, orthopnea or leg edema. He does not report any cough, wheezing or hemoptysis. He does not report any nausea, vomiting or abdominal pain. He does not report any constipation, diarrhea or medication easy. He does not report any skeletal complaints. Remaining review of systems unremarkable.   Past Medical History:  Diagnosis Date  . Anemia    a. + FOBT 08/2014.  Marland Kitchen Anginal pain ([place])    comes and goes; uses nitro to relieve   . Arthritis   . Atherosclerosis of native arteries of the extremities with intermittent claudication    a. L external iliac stent 2001, bilateral SFA occlusions documented 2001.  . Atrial fibrillation ([place])   . Bladder cancer ([place])   . CAD (coronary artery disease), native coronary artery 09/24/2013   a. CABG x 6 1999. b. Interim PCIs: Angioplasty SVG-PDA 2006.Taxus DES to native LCx in 2007.DES to SVG-PDA 2007, restented 2009.NSTEMI with PCI to SVG- PDA complicated by no-reflow/inferior infarction in 2012. c. Redo CABG 2015 with SVG-PD, SVG-D1, SVG-OM1.  . Carotid artery occlusion    a. Duplex 12/2013: mild plaque bilat, 40-59% BICA. F/u due 12/2014.  Marland Kitchen Chronic combined systolic and diastolic CHF (congestive heart failure) ([place])    a. Prior EF 45-50% in 08/2014. b. Improved EF >55% in 09/2014 at [facility].  . Chronic lower back pain   . CKD (  chronic kidney disease) stage 3, GFR 30-59 ml/min   . COPD (chronic obstructive pulmonary disease) ([place])    pt states he doesn't have  . Daily headache    comes and goes pt denies daily currently   . Dysrhythmia   . Esophageal stricture    a. History of dilitation - Long-standing history of esophageal reflux   . Gait  instability   . GERD (gastroesophageal reflux disease)   . Gout   . History of bronchitis   . History of gout   . History of hiatal hernia   . Hx of CABG 09/24/2013   a. 1999: LIMA to LAD, sequential SVG to diagonal 2 and diagonal 3, sequential SVG to PDA, acute marginal branch, and PL branch. b. Repeat CABG 2015 with SVG to D1, SVG to PDA, and SVG to OM.   Marland Kitchen Hyperlipidemia   . Hypertension   . Hypotonic bladder    Requiring in and out catheterization performed by the patient at home - occasional blood tinged urine (self-cath's daily for the last 2 years) and hematuria is chronic for him, followed by urology.  . MI (myocardial infarction) ([place]) 1999-02/2014   total of 7 MI per patient  . OSA (obstructive sleep apnea)    "don't wear mask"   . Polymyalgia rheumatica ([place])   . Self-catheterizes urinary bladder   . Shortness of breath dyspnea   . Skin cancer   . SVT (supraventricular tachycardia) ([place])   . Type II diabetes mellitus ([place])   :  Past Surgical History:  Procedure Laterality Date  . ABDOMINAL AORTAGRAM N/A 11/27/2014   Procedure: ABDOMINAL Maxcine Ham;  Surgeon: Wellington Hampshire, MD;  Location: [facility] C[facility] LAB;  Service: Cardiovascular;  Laterality: N/A;  . ANKLE FRACTURE SURGERY Right ~ 1977   "10 plates and 7 screws placed in it"  . ANTERIOR CERVICAL DECOMP/DISCECTOMY FUSION    . BACK SURGERY     neck surgery x 2  . CARDIAC CATHETERIZATION    . CATARACT EXTRACTION W/ INTRAOCULAR LENS  IMPLANT, BILATERAL Bilateral 2000's  . COLONOSCOPY    . CORONARY ARTERY BYPASS GRAFT  1999  . CORONARY ARTERY BYPASS GRAFT N/A 03/01/2014   Procedure: REDO CORONARY ARTERY BYPASS GRAFTING TIMES THREE, USING RIGHT GREATER SAPHENOUS VEIN VIA ENDOVEIN HARVEST.;  Surgeon: Gaye Pollack, MD;  Location: [facility] OR;  Service: Open Heart Surgery;  Laterality: N/A;  . CORONARY STENT PLACEMENT     "I've had 8 put in; I presently have none" (08/26/2014)  . CYSTOSCOPY W/ RETROGRADES Bilateral 03/03/2015    Procedure: CYSTOSCOPY WITH LEFT RETROGRADE PYELOGRAM;  Surgeon: Kathie Rhodes, MD;  Location: [facility];  Service: Urology;  Laterality: Bilateral;  . CYSTOSCOPY WITH BIOPSY N/A 04/25/2017   Procedure: CYSTOSCOPY WITH BLADDER BIOPSY;  Surgeon: Kathie Rhodes, MD;  Location: [facility];  Service: Urology;  Laterality: N/A;  . ENDARTERECTOMY Left 01/30/2015   Procedure: ENDARTERECTOMY CAROTID WITH PRIMARY CLOSURE OF ARTERY;  Surgeon: Angelia Mould, MD;  Location[facility] OR;  Service: Vascular;  Laterality: Left;  . ESOPHAGOGASTRODUODENOSCOPY (EGD) WITH ESOPHAGEAL DILATION  X 1  . EYE SURGERY     cataract surgery bilat   . FRACTURE SURGERY Right    ankle  . INTRAOPERATIVE TRANSESOPHAGEAL ECHOCARDIOGRAM N/A 03/01/2014   Procedure: INTRAOPERATIVE TRANSESOPHAGEAL ECHOCARDIOGRAM;  Surgeon: Gaye Pollack, MD;  Location: [facility] OR;  Service: Open Heart Surgery;  Laterality: N/A;  . IR NEPHROSTOMY PLACEMENT RIGHT  05/11/2017  . LEFT HEART CATHETERIZATION WITH CORONARY/GRAFT ANGIOGRAM  02/26/2014  Procedure: LEFT HEART CATHETERIZATION WITH Beatrix Fetters;  Surgeon: Sinclair Grooms, MD;  Location: [facility] CATH LAB;  Service: Cardiovascular;;  . LEFT HEART CATHETERIZATION WITH CORONARY/GRAFT ANGIOGRAM N/A 07/23/2014   Procedure: LEFT HEART CATHETERIZATION WITH Beatrix Fetters;  Surgeon: Sinclair Grooms, MD;  Location: [facility] CATH LAB;  Service: Cardiovascular;  Laterality: N/A;  . LEFT HEART CATHETERIZATION WITH CORONARY/GRAFT ANGIOGRAM N/A 08/30/2014   Procedure: LEFT HEART CATHETERIZATION WITH Beatrix Fetters;  Surgeon: Leonie Man, MD;  Location: [facility] CATH LAB;  Service: Cardiovascular;  Laterality: N/A;  . MOHS SURGERY  X 2   "nose; nose"  . MULTIPLE TOOTH EXTRACTIONS    . SKIN CANCER EXCISION     "all over my head; ears; back  . TONSILLECTOMY    . TRANSURETHRAL RESECTION OF BLADDER TUMOR WITH GYRUS (TURBT-GYRUS) N/A 03/03/2015   Procedure: TRANSURETHRAL RESECTION OF BLADDER TUMOR;   Surgeon: Kathie Rhodes, MD;  [facility]: [facility];  Service: Urology;  Laterality: N/A;  . TRANSURETHRAL RESECTION OF BLADDER TUMOR WITH GYRUS (TURBT-GYRUS) N/A 04/28/2015   Procedure: TRANSURETHRAL RESECTION OF BLADDER TUMOR ;  Surgeon: Kathie Rhodes, MD;  Location: [facility];  Service: Urology;  Laterality: N/A;  . TRANSURETHRAL RESECTION OF BLADDER TUMOR WITH GYRUS (TURBT-GYRUS) N/A 11/24/2015   Procedure: TRANSURETHRAL RESECTION OF BLADDER TUMOR WITH GYRUS (TURBT-GYRUS);  Surgeon: Kathie Rhodes, MD;  [facility];  Service: Urology;  Laterality: N/A;  . TRANSURETHRAL RESECTION OF PROSTATE N/A 04/25/2017   Procedure: TRANSURETHRAL RESECTION OF THE PROSTATE (TURP);  Surgeon: Kathie Rhodes, MD;  Location: [facility];  Service: Urology;  Laterality: N/A;  :   Current Outpatient Prescriptions:  .  acetaminophen (TYLENOL) 500 MG tablet, Take 1,000 mg by mouth every 8 (eight) hours as needed for moderate pain. , Disp: , Rfl:  .  allopurinol (ZYLOPRIM) 100 MG tablet, Take 100 mg by mouth 2 (two) times daily. , Disp: , Rfl:  .  amLODipine (NORVASC) 10 MG tablet, Take 10 mg by mouth every morning. , Disp: , Rfl:  .  aspirin EC 81 MG EC tablet, Take 1 tablet (81 mg total) by mouth daily., Disp: 1 tablet, Rfl: 0 .  atorvastatin (LIPITOR) 20 MG tablet, Take 20 mg by mouth daily at 6 PM. , Disp: , Rfl:  .  cholecalciferol (VITAMIN D) 400 units TABS tablet, Take 400 Units by mouth daily., Disp: , Rfl:  .  ciprofloxacin (CIPRO) 250 MG tablet, Take 250 mg by mouth See admin instructions. Take 1 tablet twice daily until 04/17/17 then take 1 tablet once daily by mouth until procedure, Disp: , Rfl: 0 .  clopidogrel (PLAVIX) 75 MG tablet, TAKE 1 TABLET BY MOUTH DAILY, Disp: 90 tablet, Rfl: 3 .  colchicine 0.6 MG tablet, Take 0.6 mg by mouth 2 (two) times daily. , Disp: , Rfl:  .  Ferrous Gluconate (IRON 27 PO), Take 1 tablet by mouth every evening., Disp: , Rfl:  .  GLIPIZIDE XL 10 MG 24 hr tablet, Take 10 mg by mouth 2 (two)  times daily., Disp: , Rfl: 3 .  hydrALAZINE (APRESOLINE) 50 MG tablet, TAKE 1 TABLET BY MOUTH THREE TIMES DAILY AS NEEDED(WILL ONLY TAKE WHEN BLOOD PRESSURE IS RUNNING HIGH. WILL AT LEAST TAKE ONCE DAILY)., Disp: 90 tablet, Rfl: 9 .  HYDROcodone-acetaminophen (NORCO) 10-325 MG tablet, Take 1-2 tablets by mouth every 4 (four) hours as needed for moderate pain. Maximum dose per 24 hours - 8 pills, Disp: 20 tablet, Rfl: 0 .  isosorbide mononitrate (  IMDUR) 120 MG 24 hr tablet, Take 1 tablet (120 mg total) by mouth daily., Disp: 90 tablet, Rfl: 3 .  LANTUS SOLOSTAR 100 UNIT/ML Solostar Pen, Inject 10 Units into the skin daily. , Disp: , Rfl: 0 .  magnesium oxide (MAG-OX) 400 (241.3 Mg) MG tablet, Take 400 mg by mouth every evening. , Disp: , Rfl:  .  metoprolol tartrate (LOPRESSOR) 100 MG tablet, Take 0.5 tablets (50 mg total) by mouth 2 (two) times daily., Disp: 90 tablet, Rfl: 3 .  nitroGLYCERIN (NITROSTAT) 0.4 MG SL tablet, PLACE 1 TABLET UNDER THE TONGUE EVERY 5 MINUTES AS NEEDED FOR CHEST PAIN, Disp: 25 tablet, Rfl: 5 .  pantoprazole (PROTONIX) 40 MG tablet, Take 1 tablet (40 mg total) by mouth daily. (Patient taking differently: Take 40 mg by mouth every other day. ), Disp: 90 tablet, Rfl: 3 .  phenazopyridine (PYRIDIUM) 200 MG tablet, Take 1 tablet (200 mg total) by mouth 3 (three) times daily as needed for pain., Disp: 20 tablet, Rfl: 0 .  torsemide (DEMADEX) 20 MG tablet, TAKE 2 TABLETS BY MOUTH EVERY DAY, Disp: 180 tablet, Rfl: 3:  Allergies  Allergen Reactions  . Other Itching and Other (See Comments)    The iv dye used in Fundus Flourescein Angiography procedure  Kidney issues  . Penicillins Hives    Augmentin Has patient had a PCN reaction causing immediate rash, facial/tongue/throat swelling, SOB or lightheadedness with hypotension: No Has patient had a PCN reaction causing severe rash involving mucus membranes or skin necrosis: No Has patient had a PCN reaction that required  hospitalization: Yes Has patient had a PCN reaction occurring within the last 10 years: Yes If all of the above answers are "NO", then may proceed with Cephalosporin use.   :  Family History  Problem Relation Age of Onset  . CAD Mother   . Heart disease Mother   . Hypertension Mother   . Cirrhosis Brother   . Heart disease Father   . Hyperlipidemia Father   . Hypertension Father   . Heart attack Father   :  Social History   Social History  . Marital status: Married    Spouse name: N/A  . Number of children: N/A  . Years of education: N/A   Occupational History  . Not on file.   Social History Main Topics  . Smoking status: Former Smoker    Packs/day: 2.00    Years: 50.00    Types: Cigarettes    Quit date: 09/18/2015  . Smokeless tobacco: Never Used  . 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 Drug use: No  . Sexual activity: Not on file   Other Topics Concern  . Not on file   Social History Narrative   Tour manager         :  Pertinent items are noted in HPI.  Exam: Blood pressure (!) 166/50, pulse 62, temperature (!) 97.4 F (36.3 C), temperature source Oral, resp. rate 18, height 5\' 6"  (1.676 m), weight 164 lb 9.6 oz (74.7 kg), SpO2 100 %.  ECOG 1 General appearance: alert and cooperative appeared without distress. Throat: No oral thrush or ulcers. Neck: no adenopathy Back: negative Resp: clear to auscultation bilaterally Chest wall: no tenderness Cardio: regular rate and rhythm, S1, S2 normal, no murmur, click, rub or gallop GI: soft, non-tender; bowel sounds normal; no masses,  no organomegaly Extremities: extremities normal, atraumatic,  no cyanosis or edema Pulses: 2+ and symmetric Skin: Skin color, texture, turgor normal. No rashes or lesions Lymph nodes: Cervical, supraclavicular, and axillary nodes normal.  CBC    Component Value Date/Time   WBC 10.0 05/11/2017 1215   RBC 4.53  05/11/2017 1215   HGB 12.5 (L) 05/11/2017 1215   HCT 36.8 (L) 05/11/2017 1215   PLT 234 05/11/2017 1215   MCV 81.2 05/11/2017 1215   MCH 27.6 05/11/2017 1215   MCHC 34.0 05/11/2017 1215   RDW 14.4 05/11/2017 1215   LYMPHSABS 2.0 05/11/2017 1215   MONOABS 1.0 05/11/2017 1215   EOSABS 0.3 05/11/2017 1215   BASOSABS 0.1 05/11/2017 1215     Chemistry      Component Value Date/Time   NA 138 05/11/2017 1215   NA 139 10/14/2014 0951   K 4.0 05/11/2017 1215   CL 100 (L) 05/11/2017 1215   CO2 29 05/11/2017 1215   BUN 44 (H) 05/11/2017 1215   BUN 45 (H) 10/14/2014 0951   CREATININE 1.93 (H) 05/11/2017 1215      Component Value Date/Time   CALCIUM 9.5 05/11/2017 1215   ALKPHOS 72 12/17/2015 2110   AST 27 12/17/2015 2110   ALT 25 12/17/2015 2110   BILITOT 0.4 12/17/2015 2110       Assessment and Plan:   76 year old gentleman with the following issues:  1. 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 This was diagnosed in July 2018 in the setting of a bladder diverticulum and history of superficial bladder tumor. Imaging studies in December 2017 did not show any evidence of lymphadenopathy. His chest x-ray in July 2018 did not show any metastatic disease. He does have hydronephrosis and status post percutaneous nephrostomy tube.  The natural course of this disease was discussed today with the patient and his wife. He will require complete staging at this time to determine if he has advanced disease beyond the bladder. If he has metastatic disease than curative options do not exist at this time and we are facing only palliative options with palliative chemotherapy as his treatment of choice. If his imaging studies shows local disease only, curative surgery can be an option.  The rationale for using neoadjuvant platinum based chemotherapy was discussed today with the patient. He is not  a candidate for cisplatin given his renal insufficiency. His baseline creatinine clearance is poor to start with given his diabetes and most recently his kidney function have worsened. I do anticipate improvement in his kidney function after his nephrostomy tube placement but I do not think it is enough to make him a candidate for neoadjuvant cis-platinum chemotherapy.  For this reason, I have recommended to proceed with upfront radical cystoprostatectomy in attempt to control his disease sooner rather than later. The squamous differentiation aspect of his cancer is worrisome and predicts a poor response to chemotherapy which will delay his potential curative option.  He is scheduled to see Dr. Tresa Moore in the near future and I recommend proceeding with surgery if he is a candidate for it. If he is not a candidate for it, radiation with chemotherapy would be a secondary inferior option.  I will arrange for him to have a PET CT scan in the immediate future and I will communicate these results to him prior to his meeting with Dr. Tresa Moore.  2. Renal insufficiency: His baseline creatinine clearance is close to 40 mL/m or less.  3.  Follow-up: Will be next few weeks pending the results of the PET scan and consultation with Dr. Tresa Moore.

## 2017-05-25 ENCOUNTER — Ambulatory Visit (HOSPITAL_COMMUNITY)
Admission: RE | Admit: 2017-05-25 | Discharge: 2017-05-25 | Disposition: A | Discharge status: Home / Self Care | Payer: Medicare Other | Source: Ambulatory Visit | Attending: Urology | Admitting: Urology

## 2017-05-25 ENCOUNTER — Encounter (HOSPITAL_COMMUNITY): Payer: Self-pay

## 2017-05-25 DIAGNOSIS — N135 Crossing vessel and stricture of ureter without hydronephrosis: Secondary | ICD-10-CM | POA: Diagnosis not present

## 2017-05-25 DIAGNOSIS — C679 Malignant neoplasm of bladder, unspecified: Secondary | ICD-10-CM | POA: Diagnosis not present

## 2017-05-25 DIAGNOSIS — Z951 Presence of aortocoronary bypass graft: Secondary | ICD-10-CM | POA: Insufficient documentation

## 2017-05-25 DIAGNOSIS — I471 Supraventricular tachycardia: Secondary | ICD-10-CM | POA: Diagnosis not present

## 2017-05-25 DIAGNOSIS — Z880 Allergy status to penicillin: Secondary | ICD-10-CM | POA: Insufficient documentation

## 2017-05-25 DIAGNOSIS — Z794 Long term (current) use of insulin: Secondary | ICD-10-CM | POA: Diagnosis not present

## 2017-05-25 DIAGNOSIS — Z9889 Other specified postprocedural states: Secondary | ICD-10-CM | POA: Diagnosis not present

## 2017-05-25 DIAGNOSIS — K219 Gastro-esophageal reflux disease without esophagitis: Secondary | ICD-10-CM | POA: Diagnosis not present

## 2017-05-25 DIAGNOSIS — M353 Polymyalgia rheumatica: Secondary | ICD-10-CM | POA: Diagnosis not present

## 2017-05-25 DIAGNOSIS — Z8249 Family history of ischemic heart disease and other diseases of the circulatory system: Secondary | ICD-10-CM | POA: Insufficient documentation

## 2017-05-25 DIAGNOSIS — I6529 Occlusion and stenosis of unspecified carotid artery: Secondary | ICD-10-CM | POA: Insufficient documentation

## 2017-05-25 DIAGNOSIS — E1122 Type 2 diabetes mellitus with diabetic chronic kidney disease: Secondary | ICD-10-CM | POA: Diagnosis not present

## 2017-05-25 DIAGNOSIS — N183 Chronic kidney disease, stage 3 (moderate): Secondary | ICD-10-CM | POA: Diagnosis not present

## 2017-05-25 DIAGNOSIS — I5042 Chronic combined systolic (congestive) and diastolic (congestive) heart failure: Secondary | ICD-10-CM | POA: Diagnosis not present

## 2017-05-25 DIAGNOSIS — G8929 Other chronic pain: Secondary | ICD-10-CM | POA: Diagnosis not present

## 2017-05-25 DIAGNOSIS — I4891 Unspecified atrial fibrillation: Secondary | ICD-10-CM | POA: Insufficient documentation

## 2017-05-25 DIAGNOSIS — I132 Hypertensive heart and chronic kidney disease with heart failure and with stage 5 chronic kidney disease, or end stage renal disease: Secondary | ICD-10-CM | POA: Diagnosis not present

## 2017-05-25 DIAGNOSIS — J449 Chronic obstructive pulmonary disease, unspecified: Secondary | ICD-10-CM | POA: Insufficient documentation

## 2017-05-25 DIAGNOSIS — M1990 Unspecified osteoarthritis, unspecified site: Secondary | ICD-10-CM | POA: Insufficient documentation

## 2017-05-25 DIAGNOSIS — Z87891 Personal history of nicotine dependence: Secondary | ICD-10-CM | POA: Insufficient documentation

## 2017-05-25 DIAGNOSIS — Z955 Presence of coronary angioplasty implant and graft: Secondary | ICD-10-CM | POA: Diagnosis not present

## 2017-05-25 DIAGNOSIS — I252 Old myocardial infarction: Secondary | ICD-10-CM | POA: Insufficient documentation

## 2017-05-25 DIAGNOSIS — M545 Low back pain: Secondary | ICD-10-CM | POA: Diagnosis not present

## 2017-05-25 DIAGNOSIS — G4733 Obstructive sleep apnea (adult) (pediatric): Secondary | ICD-10-CM | POA: Diagnosis not present

## 2017-05-25 DIAGNOSIS — Z7982 Long term (current) use of aspirin: Secondary | ICD-10-CM | POA: Insufficient documentation

## 2017-05-25 DIAGNOSIS — M109 Gout, unspecified: Secondary | ICD-10-CM | POA: Diagnosis not present

## 2017-05-25 DIAGNOSIS — E785 Hyperlipidemia, unspecified: Secondary | ICD-10-CM | POA: Diagnosis not present

## 2017-05-25 DIAGNOSIS — I2510 Atherosclerotic heart disease of native coronary artery without angina pectoris: Secondary | ICD-10-CM | POA: Diagnosis not present

## 2017-05-25 DIAGNOSIS — N1330 Unspecified hydronephrosis: Secondary | ICD-10-CM

## 2017-05-25 HISTORY — PX: IR URETERAL STENT PLACEMENT EXISTING ACCESS RIGHT: IMG6074

## 2017-05-25 LAB — BASIC METABOLIC PANEL
ANION GAP: 10 (ref 5–15)
BUN: 47 mg/dL — AB (ref 6–20)
CALCIUM: 9.6 mg/dL (ref 8.9–10.3)
CO2: 27 mmol/L (ref 22–32)
Chloride: 104 mmol/L (ref 101–111)
Creatinine, Ser: 1.8 mg/dL — ABNORMAL HIGH (ref 0.61–1.24)
GFR calc Af Amer: 41 mL/min — ABNORMAL LOW (ref >60)
GFR, EST NON AFRICAN AMERICAN: 35 mL/min — AB (ref >60)
GLUCOSE: 126 mg/dL — AB (ref 65–99)
POTASSIUM: 4.2 mmol/L (ref 3.5–5.1)
SODIUM: 141 mmol/L (ref 135–145)

## 2017-05-25 LAB — CBC
HEMATOCRIT: 36.9 % — AB (ref 39.0–52.0)
HEMOGLOBIN: 12.7 g/dL — AB (ref 13.0–17.0)
MCH: 27.8 pg (ref 26.0–34.0)
MCHC: 34.4 g/dL (ref 30.0–36.0)
MCV: 80.7 fL (ref 78.0–100.0)
Platelets: 210 10*3/uL (ref 150–400)
RBC: 4.57 MIL/uL (ref 4.22–5.81)
RDW: 14.5 % (ref 11.5–15.5)
WBC: 9.2 10*3/uL (ref 4.0–10.5)

## 2017-05-25 LAB — GLUCOSE, CAPILLARY: Glucose-Capillary: 138 mg/dL — ABNORMAL HIGH (ref 65–99)

## 2017-05-25 LAB — PROTIME-INR
INR: 0.96
PROTHROMBIN TIME: 12.8 s (ref 11.4–15.2)

## 2017-05-25 LAB — APTT: APTT: 27 s (ref 24–36)

## 2017-05-25 MED ORDER — NALOXONE HCL 0.4 MG/ML IJ SOLN
INTRAMUSCULAR | Status: AC
  Filled 2017-05-25: qty 1

## 2017-05-25 MED ORDER — MIDAZOLAM HCL 2 MG/2ML IJ SOLN
INTRAMUSCULAR | Status: AC
  Filled 2017-05-25: qty 4

## 2017-05-25 MED ORDER — IOPAMIDOL (ISOVUE-300) INJECTION 61%
INTRAVENOUS | Status: AC
  Administered 2017-05-25: 20 mL via INTRAVENOUS
  Filled 2017-05-25: qty 50

## 2017-05-25 MED ORDER — DIPHENHYDRAMINE HCL 50 MG/ML IJ SOLN
INTRAMUSCULAR | Status: AC
  Filled 2017-05-25: qty 1

## 2017-05-25 MED ORDER — CIPROFLOXACIN IN D5W 400 MG/200ML IV SOLN: 400.0000 mg | INTRAVENOUS | Status: DC

## 2017-05-25 MED ORDER — FLUMAZENIL 0.5 MG/5ML IV SOLN
INTRAVENOUS | Status: AC
  Filled 2017-05-25: qty 5

## 2017-05-25 MED ORDER — LIDOCAINE HCL 1 % IJ SOLN
INTRAMUSCULAR | Status: AC
  Filled 2017-05-25: qty 20

## 2017-05-25 MED ORDER — FENTANYL CITRATE (PF) 100 MCG/2ML IJ SOLN
INTRAMUSCULAR | Status: AC | PRN
  Administered 2017-05-25: 50 ug via INTRAVENOUS

## 2017-05-25 MED ORDER — IOPAMIDOL (ISOVUE-300) INJECTION 61%
50.0000 mL | Freq: Once | INTRAVENOUS | Status: AC | PRN
  Administered 2017-05-25: 20 mL via INTRAVENOUS

## 2017-05-25 MED ORDER — CIPROFLOXACIN IN D5W 400 MG/200ML IV SOLN
INTRAVENOUS | Status: AC
  Administered 2017-05-25: 400 mg
  Filled 2017-05-25: qty 200

## 2017-05-25 MED ORDER — SODIUM CHLORIDE 0.9 % IV SOLN
INTRAVENOUS | Status: DC
  Administered 2017-05-25: 12:00:00 via INTRAVENOUS

## 2017-05-25 MED ORDER — POTASSIUM CHLORIDE IN NACL 20-0.9 MEQ/L-% IV SOLN
INTRAVENOUS | Status: DC
  Filled 2017-05-25: qty 1000

## 2017-05-25 MED ORDER — MIDAZOLAM HCL 2 MG/2ML IJ SOLN
INTRAMUSCULAR | Status: AC | PRN
  Administered 2017-05-25: 1 mg via INTRAVENOUS
  Administered 2017-05-25: 0.5 mg via INTRAVENOUS

## 2017-05-25 MED ORDER — DIPHENHYDRAMINE HCL 50 MG/ML IJ SOLN
INTRAMUSCULAR | Status: AC | PRN
  Administered 2017-05-25: 25 mg via INTRAVENOUS

## 2017-05-25 MED ORDER — FENTANYL CITRATE (PF) 100 MCG/2ML IJ SOLN
INTRAMUSCULAR | Status: AC
  Filled 2017-05-25: qty 4

## 2017-05-25 MED ORDER — LIDOCAINE HCL 1 % IJ SOLN
INTRAMUSCULAR | Status: AC | PRN
  Administered 2017-05-25: 10 mL

## 2017-05-25 NOTE — H&P (Signed)
Chief Complaint: Patient was seen in consultation today for placement of right ureteral stent at the request of [place]  Referring Physician(s): Kathie Rhodes  Supervising Physician: Corrie Mckusick  Patient Status: [facility] - Out-pt  History of Present Illness: [name] is a 76 y.o. male with bladder cancer and obstructed right distal ureteral. He underwent successful (R)PCN placement about 2 weeks ago. He reports the drain is working well. Had about 2 days of blood tinged UOP, now clear. He is now scheduled for attempt at internalization to a ureteral stent. PMHx reviewed and updated. Meds, labs, allergies reviewed. Plavix has remained on HOLD since 7/9 Has been NPO this am. Wife at bedside  Past Medical History:  Diagnosis Date  . Anemia    a. + FOBT 08/2014.  Marland Kitchen Anginal pain ([place])    comes and goes; uses nitro to relieve   . Arthritis   . Atherosclerosis of native arteries of the extremities with intermittent claudication    a. L external iliac stent 2001, bilateral SFA occlusions documented 2001.  . Atrial fibrillation ([place])   . Bladder cancer ([place])   . CAD (coronary artery disease), native coronary artery 09/24/2013   a. CABG x 6 1999. b. Interim PCIs: Angioplasty SVG-PDA 2006.Taxus DES to native LCx in 2007.DES to SVG-PDA 2007, restented 2009.NSTEMI with PCI to SVG- PDA complicated by no-reflow/inferior infarction in 2012. c. Redo CABG 2015 with SVG-PD, SVG-D1, SVG-OM1.  . Carotid artery occlusion    a. Duplex 12/2013: mild plaque bilat, 40-59% BICA. F/u due 12/2014.  Marland Kitchen Chronic combined systolic and diastolic CHF (congestive heart failure) ([place])    a. Prior EF 45-50% in 08/2014. b. Improved EF >55% in 09/2014 at [facility].  . Chronic lower back pain   . CKD (chronic kidney disease) stage 3, GFR 30-59 ml/min   . COPD (chronic obstructive pulmonary disease) ([place])    pt states he doesn't have  . Daily headache    comes and goes pt denies daily currently   .  Dysrhythmia   . Esophageal stricture    a. History of dilitation - Long-standing history of esophageal reflux   . Gait instability   . GERD (gastroesophageal reflux disease)   . Gout   . History of bronchitis   . History of gout   . History of hiatal hernia   . Hx of CABG 09/24/2013   a. 1999: LIMA to LAD, sequential SVG to diagonal 2 and diagonal 3, sequential SVG to PDA, acute marginal branch, and PL branch. b. Repeat CABG 2015 with SVG to D1, SVG to PDA, and SVG to OM.   Marland Kitchen Hyperlipidemia   . Hypertension   . Hypotonic bladder    Requiring in and out catheterization performed by the patient at home - occasional blood tinged urine (self-cath's daily for the last 2 years) and hematuria is chronic for him, followed by urology.  . MI (myocardial infarction) ([place]) 1999-02/2014   total of 7 MI per patient  . OSA (obstructive sleep apnea)    "don't wear mask"   . Polymyalgia rheumatica ([place])   . Self-catheterizes urinary bladder   . Shortness of breath dyspnea   . Skin cancer   . SVT (supraventricular tachycardia) ([place])   . Type II diabetes mellitus ([place])     Past Surgical History:  Procedure Laterality Date  . ABDOMINAL AORTAGRAM N/A 11/27/2014   Procedure: ABDOMINAL Maxcine Ham;  Surgeon: Wellington Hampshire, MD;  Location: [facility] CATH LAB;  Service: Cardiovascular;  Laterality:  N/A;  . ANKLE FRACTURE SURGERY Right ~ 1977   "10 plates and 7 screws placed in it"  . ANTERIOR CERVICAL DECOMP/DISCECTOMY FUSION    . BACK SURGERY     neck surgery x 2  . CARDIAC CATHETERIZATION    . CATARACT EXTRACTION W/ INTRAOCULAR LENS  IMPLANT, BILATERAL Bilateral 2000's  . COLONOSCOPY    . CORONARY ARTERY BYPASS GRAFT  1999  . CORONARY ARTERY BYPASS GRAFT N/A 03/01/2014   Procedure: REDO CORONARY ARTERY BYPASS GRAFTING TIMES THREE, USING RIGHT GREATER SAPHENOUS VEIN VIA ENDOVEIN HARVEST.;  Surgeon: Gaye Pollack, MD;  Location: [facility] OR;  Service: Open Heart Surgery;  Laterality: N/A;  . CORONARY STENT  PLACEMENT     "I've had 8 put in; I presently have none" (08/26/2014)  . CYSTOSCOPY W/ RETROGRADES Bilateral 03/03/2015   Procedure: CYSTOSCOPY WITH LEFT RETROGRADE PYELOGRAM;  Surgeon: Kathie Rhodes, MD;  Location: [facility] ORS;  Service: Urology;  Laterality: Bilateral;  . CYSTOSCOPY WITH BIOPSY N/A 04/25/2017   Procedure: CYSTOSCOPY WITH BLADDER BIOPSY;  Surgeon: Kathie Rhodes, MD;  Location: [facility] ORS;  Service: Urology;  Laterality: N/A;  . ENDARTERECTOMY Left 01/30/2015   Procedure: ENDARTERECTOMY CAROTID WITH PRIMARY CLOSURE OF ARTERY;  Surgeon: Angelia Mould, MD;  Location: [facility] OR;  Service: Vascular;  Laterality: Left;  . ESOPHAGOGASTRODUODENOSCOPY (EGD) WITH ESOPHAGEAL DILATION  X 1  . EYE SURGERY     cataract surgery bilat   . FRACTURE SURGERY Right    ankle  . INTRAOPERATIVE TRANSESOPHAGEAL ECHOCARDIOGRAM N/A 03/01/2014   Procedure: INTRAOPERATIVE TRANSESOPHAGEAL ECHOCARDIOGRAM;  Surgeon: Gaye Pollack, MD;  Location: [facility] OR;  Service: Open Heart Surgery;  Laterality: N/A;  . IR NEPHROSTOMY PLACEMENT RIGHT  05/11/2017  . LEFT HEART CATHETERIZATION WITH CORONARY/GRAFT ANGIOGRAM  02/26/2014   Procedure: LEFT HEART CATHETERIZATION WITH Beatrix Fetters;  Surgeon: Sinclair Grooms, MD;  Location: [facility] CATH LAB;  Service: Cardiovascular;;  . LEFT HEART CATHETERIZATION WITH CORONARY/GRAFT ANGIOGRAM N/A 07/23/2014   Procedure: LEFT HEART CATHETERIZATION WITH Beatrix Fetters;  Surgeon: Sinclair Grooms, MD;  Location: [facility] CATH LAB;  Service: Cardiovascular;  Laterality: N/A;  . LEFT HEART CATHETERIZATION WITH CORONARY/GRAFT ANGIOGRAM N/A 08/30/2014   Procedure: LEFT HEART CATHETERIZATION WITH Beatrix Fetters;  Surgeon: Leonie Man, MD;  Location: [facility] CATH LAB;  Service: Cardiovascular;  Laterality: N/A;  . MOHS SURGERY  X 2   "nose; nose"  . MULTIPLE TOOTH EXTRACTIONS    . SKIN CANCER EXCISION     "all over my head; ears; back  . TONSILLECTOMY    . TRANSURETHRAL  RESECTION OF BLADDER TUMOR WITH GYRUS (TURBT-GYRUS) N/A 03/03/2015   Procedure: TRANSURETHRAL RESECTION OF BLADDER TUMOR;  Surgeon: Kathie Rhodes, MD;  [facility]: [facility];  Service: Urology;  Laterality: N/A;  . TRANSURETHRAL RESECTION OF BLADDER TUMOR WITH GYRUS (TURBT-GYRUS) N/A 04/28/2015   Procedure: TRANSURETHRAL RESECTION OF BLADDER TUMOR ;  Surgeon: Kathie Rhodes, MD;  Location: [facility] ORS;  Service: Urology;  Laterality: N/A;  . TRANSURETHRAL RESECTION OF BLADDER TUMOR WITH GYRUS (TURBT-GYRUS) N/A 11/24/2015   Procedure: TRANSURETHRAL RESECTION OF BLADDER TUMOR WITH GYRUS (TURBT-GYRUS);  Surgeon: Kathie Rhodes, MD;  Location: [facility];  Service: Urology;  Laterality: N/A;  . TRANSURETHRAL RESECTION OF PROSTATE N/A 04/25/2017   Procedure: TRANSURETHRAL RESECTION OF THE PROSTATE (TURP);  Surgeon: Kathie Rhodes, MD;  Location: [facility] ORS;  Service: Urology;  Laterality: N/A;    Allergies: Other and Penicillins  Medications: Prior to Admission medications   Medication Sig Start Date  End Date Taking? Authorizing Provider  acetaminophen (TYLENOL) 500 MG tablet Take 1,000 mg by mouth every 8 (eight) hours as needed for moderate pain.    Yes [provider]  allopurinol (ZYLOPRIM) 100 MG tablet Take 100 mg by mouth 2 (two) times daily.  08/18/16  Yes [provider]  amLODipine (NORVASC) 10 MG tablet Take 10 mg by mouth every morning.    Yes [provider]  atorvastatin (LIPITOR) 20 MG tablet Take 20 mg by mouth daily at 6 PM.    Yes [provider]  cholecalciferol (VITAMIN D) 400 units TABS tablet Take 400 Units by mouth daily.   Yes [provider]  colchicine 0.6 MG tablet Take 0.6 mg by mouth 2 (two) times daily.  01/27/17  Yes [provider]  Ferrous Gluconate (IRON 27 PO) Take 1 tablet by mouth every evening.   Yes [provider]  GLIPIZIDE XL 10 MG 24 hr tablet Take 10 mg by mouth 2 (two) times daily. 12/09/15  Yes [provider]    hydrALAZINE (APRESOLINE) 50 MG tablet TAKE 1 TABLET BY MOUTH THREE TIMES DAILY AS NEEDED(WILL ONLY TAKE WHEN BLOOD PRESSURE IS RUNNING HIGH. WILL AT LEAST TAKE ONCE DAILY). 05/16/17  Yes Belva Crome, MD  isosorbide mononitrate (IMDUR) 120 MG 24 hr tablet Take 1 tablet (120 mg total) by mouth daily. 01/31/17  Yes Belva Crome, MD  LANTUS SOLOSTAR 100 UNIT/ML Solostar Pen Inject 10 Units into the skin daily.  04/01/17  Yes [provider]  magnesium oxide (MAG-OX) 400 (241.3 Mg) MG tablet Take 400 mg by mouth every evening.    Yes [provider]  metoprolol tartrate (LOPRESSOR) 100 MG tablet Take 0.5 tablets (50 mg total) by mouth 2 (two) times daily. 03/22/17  Yes Wellington Hampshire, MD  nitroGLYCERIN (NITROSTAT) 0.4 MG SL tablet PLACE 1 TABLET UNDER THE TONGUE EVERY 5 MINUTES AS NEEDED FOR CHEST PAIN 08/27/16  Yes Belva Crome, MD  pantoprazole (PROTONIX) 40 MG tablet Take 1 tablet (40 mg total) by mouth daily. Patient taking differently: Take 40 mg by mouth every other day.  03/07/17  Yes Belva Crome, MD  torsemide (DEMADEX) 20 MG tablet TAKE 2 TABLETS BY MOUTH EVERY DAY 03/21/17  Yes Belva Crome, MD  aspirin EC 81 MG EC tablet Take 1 tablet (81 mg total) by mouth daily. 11/19/14   Wellington Hampshire, MD  ciprofloxacin (CIPRO) 250 MG tablet Take 250 mg by mouth See admin instructions. Take 1 tablet twice daily until 04/17/17 then take 1 tablet once daily by mouth until procedure 03/08/17   [provider]  clopidogrel (PLAVIX) 75 MG tablet TAKE 1 TABLET BY MOUTH DAILY 02/23/17   Belva Crome, MD  HYDROcodone-acetaminophen [facility]) 10-325 MG tablet Take 1-2 tablets by mouth every 4 (four) hours as needed for moderate pain. Maximum dose per 24 hours - 8 pills 04/25/17   Kathie Rhodes, MD  phenazopyridine (PYRIDIUM) 200 MG tablet Take 1 tablet (200 mg total) by mouth 3 (three) times daily as needed for pain. 04/25/17   Kathie Rhodes, MD     Family History  Problem Relation Age  of Onset  . CAD Mother   . Heart disease Mother   . Hypertension Mother   . Cirrhosis Brother   . Heart disease Father   . Hyperlipidemia Father   . Hypertension Father   . Heart attack Father     Social History   Social  History  . Marital status: Married    Spouse name: N/A  . Number of children: N/A  . Years of education: N/A   Social History Main Topics  . Smoking status: Former Smoker    Packs/day: 2.00    Years: 50.00    Types: Cigarettes    Quit date: 09/18/2015  . Smokeless tobacco: Never Used  . 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 Drug use: No  . Sexual activity: Not Asked   Other Topics Concern  . None   Social History Narrative   Tour manager           Review of Systems: A 12 point ROS discussed and pertinent positives are indicated in the HPI above.  All other systems are negative.  Review of Systems  Vital Signs: BP (!) 152/52 (BP Location: Left Arm)   Pulse (!) 56   Temp (!) 97.5 F (36.4 C) (Oral)   Resp 18   SpO2 98%   Physical Exam  Constitutional: He is oriented to person, place, and time. He appears well-developed and well-nourished. No distress.  HENT:  Head: Normocephalic.  Mouth/Throat: Oropharynx is clear and moist.  Neck: Normal range of motion. No JVD present. No tracheal deviation present.  Cardiovascular: Normal rate, regular rhythm and normal heart sounds.   Pulmonary/Chest: Effort normal and breath sounds normal. No respiratory distress.  Abdominal: Soft. He exhibits no distension. There is no tenderness.  (R)PCN intact, site clean. Clear yellow UOP  Neurological: He is alert and oriented to person, place, and time.  Psychiatric: He has a normal mood and affect. Judgment normal.    Mallampati Score:  MD Evaluation Airway: WNL Heart: WNL Abdomen: WNL Chest/ Lungs: WNL ASA  Classification: 3 Mallampati/Airway Score: Two  Imaging: Dg Chest 2  View  Result Date: 05/11/2017 CLINICAL DATA:  Known history of bladder tumor EXAM: CHEST  2 VIEW COMPARISON:  11/06/2016 FINDINGS: Cardiac shadow is within normal limits. Postsurgical changes are noted. No acute infiltrate or sizable effusion is seen. Degenerative changes of thoracic spine are noted. IMPRESSION: No active cardiopulmonary disease. Electronically Signed   By: Inez Catalina M.D.   On: 05/11/2017 14:27   Ir Nephrostomy Placement Right  Result Date: 05/12/2017 CLINICAL DATA:  Recurrent bladder carcinoma with obstructed right ureteral orifice. Need for right percutaneous nephrostomy to decompress the renal collecting system. EXAM: 1. ULTRASOUND GUIDANCE FOR PUNCTURE OF THE RIGHT RENAL COLLECTING SYSTEM. 2. RIGHT PERCUTANEOUS NEPHROSTOMY TUBE PLACEMENT. COMPARISON:  CT of the pelvis on 04/06/2017 ANESTHESIA/SEDATION: 1.0 mg IV Versed; 50 mcg IV Fentanyl. Total Moderate Sedation Time 17 minutes. The patient's level of consciousness and physiologic status were continuously monitored during the procedure by Radiology nursing. CONTRAST:  10 mL Isovue 300 MEDICATIONS: 400 mg IV Cipro. Antibiotic was administered in an appropriate time frame prior to skin puncture. FLUOROSCOPY TIME:  30 seconds.  7.3 mGy. PROCEDURE: The procedure, risks, benefits, and alternatives were explained to the patient. Questions regarding the procedure were encouraged and answered. The patient understands and consents to the procedure. A time-out was performed prior to initiating the procedure. The right flank region was prepped with chlorhexidine in a sterile fashion, and a sterile drape was applied covering the operative field. A sterile gown and sterile gloves were used for the procedure. Local anesthesia was provided with 1% Lidocaine. Ultrasound was used to localize the right kidney. Under direct ultrasound guidance, a 21  gauge needle was advanced into the renal collecting system. Ultrasound image documentation was performed.  Aspiration of urine sample was performed followed by contrast injection. A transitional dilator was advanced over a guidewire. Percutaneous tract dilatation was then performed over the guidewire. A 10 French percutaneous nephrostomy tube was then advanced and formed in the collecting system. Catheter position was confirmed by fluoroscopy after contrast injection. The catheter was secured at the skin with a Prolene retention suture and Stat-Lock device. A gravity bag was placed. COMPLICATIONS: None. FINDINGS: Ultrasound demonstrates moderate right-sided hydronephrosis. The nephrostomy tube was formed in the renal pelvis after interpolar access of the collecting system. There was prompt return of blood tinged urine. IMPRESSION: Right-sided percutaneous nephrostomy tube placement. A 10 French catheter was placed and formed in the renal pelvis. This was attached to a gravity drainage bag. Electronically Signed   By: Aletta Edouard M.D.   On: 05/12/2017 09:51    Labs:  CBC:  Recent Labs  04/21/17 0938 05/11/17 1215 05/25/17 1146  WBC 8.9 10.0 9.2  HGB 12.7* 12.5* 12.7*  HCT 37.2* 36.8* 36.9*  PLT 216 234 210    COAGS:  Recent Labs  05/11/17 1215  INR 0.91    BMP:  Recent Labs  04/21/17 0938 05/11/17 1215  NA 137 138  K 5.2* 4.0  CL 103 100*  CO2 29 29  GLUCOSE 264* 283*  BUN 45* 44*  CALCIUM 9.6 9.5  CREATININE 1.99* 1.93*  GFRNONAA 31* 32*  GFRAA 36* 37*    LIVER FUNCTION TESTS: No results for input(s): BILITOT, AST, ALT, ALKPHOS, PROT, ALBUMIN in the last 8760 hours.  TUMOR MARKERS: No results for input(s): AFPTM, CEA, CA199, CHROMGRNA in the last 8760 hours.  Assessment and Plan: Bladder cancer with right ureteral obstruction S/p (R)PCN 7/26 Plan for attempt at internalization to ureteral stent Labs ok Risks and benefits discussed with the patient including, but not limited to infection, bleeding, significant bleeding causing loss or decrease in renal function or  damage to adjacent structures.  All of the patient's questions were answered, patient is agreeable to proceed. Consent signed and in chart.    Thank you for this interesting consult.  I greatly enjoyed meeting [name] and look forward to participating in their care.  A copy of this report was sent to the requesting provider on this date.  Electronically Signed: Ascencion Dike, PA-C 05/25/2017, 12:12 PM   I spent a total of 20 minutes in face to face in clinical consultation, greater than 50% of which was counseling/coordinating care for right ureteral stent placement

## 2017-05-25 NOTE — Sedation Documentation (Signed)
Patient denies pain and is resting comfortably.  

## 2017-05-25 NOTE — Discharge Instructions (Signed)
Ureteral Stent Implantation, Care After °Refer to this sheet in the next few weeks. These instructions provide you with information about caring for yourself after your procedure. Your health care provider may also give you more specific instructions. Your treatment has been planned according to current medical practices, but problems sometimes occur. Call your health care provider if you have any problems or questions after your procedure. °What can I expect after the procedure? °After the procedure, it is common to have: °· Nausea. °· Mild pain when you urinate. You may feel this pain in your lower back or lower abdomen. Pain should stop within a few minutes after you urinate. This may last for up to 1 week. °· A small amount of blood in your urine for several days. ° °Follow these instructions at home: ° °Medicines °· Take over-the-counter and prescription medicines only as told by your health care provider. °· If you were prescribed an antibiotic medicine, take it as told by your health care provider. Do not stop taking the antibiotic even if you start to feel better. °· Do not drive for 24 hours if you received a sedative. °· Do not drive or operate heavy machinery while taking prescription pain medicines. °Activity °· Return to your normal activities as told by your health care provider. Ask your health care provider what activities are safe for you. °· Do not lift anything that is heavier than 10 lb (4.5 kg). Follow this limit for 1 week after your procedure, or for as long as told by your health care provider. °General instructions °· Watch for any blood in your urine. Call your health care provider if the amount of blood in your urine increases. °· If you have a catheter: °? Follow instructions from your health care provider about taking care of your catheter and collection bag. °? Do not take baths, swim, or use a hot tub until your health care provider approves. °· Drink enough fluid to keep your urine  clear or pale yellow. °· Keep all follow-up visits as told by your health care provider. This is important. °Contact a health care provider if: °· You have pain that gets worse or does not get better with medicine, especially pain when you urinate. °· You have difficulty urinating. °· You feel nauseous or you vomit repeatedly during a period of more than 2 days after the procedure. °Get help right away if: °· Your urine is dark red or has blood clots in it. °· You are leaking urine (have incontinence). °· The end of the stent comes out of your urethra. °· You cannot urinate. °· You have sudden, sharp, or severe pain in your abdomen or lower back. °· You have a fever. °This information is not intended to replace advice given to you by your health care provider. Make sure you discuss any questions you have with your health care provider. °Document Released: 06/06/2013 Document Revised: 03/11/2016 Document Reviewed: 04/18/2015 °Elsevier Interactive Patient Education © 2018 [employer] ° °Moderate Conscious Sedation, Adult, Care After °These instructions provide you with information about caring for yourself after your procedure. Your health care provider may also give you more specific instructions. Your treatment has been planned according to current medical practices, but problems sometimes occur. Call your health care provider if you have any problems or questions after your procedure. °What can I expect after the procedure? °After your procedure, it is common: °· To feel sleepy for several hours. °· To feel clumsy and have poor balance   for several hours. °· To have poor judgment for several hours. °· To vomit if you eat too soon. ° °Follow these instructions at home: °For at least 24 hours after the procedure: ° °· Do not: °? Participate in activities where you could fall or become injured. °? Drive. °? Use heavy machinery. °? Drink alcohol. °? Take sleeping pills or medicines that cause drowsiness. °? Make  important decisions or sign legal documents. °? Take care of children on your own. °· Rest. °Eating and drinking °· Follow the diet recommended by your health care provider. °· If you vomit: °? Drink water, juice, or soup when you can drink without vomiting. °? Make sure you have little or no nausea before eating solid foods. °General instructions °· Have a responsible adult stay with you until you are awake and alert. °· Take over-the-counter and prescription medicines only as told by your health care provider. °· If you smoke, do not smoke without supervision. °· Keep all follow-up visits as told by your health care provider. This is important. °Contact a health care provider if: °· You keep feeling nauseous or you keep vomiting. °· You feel light-headed. °· You develop a rash. °· You have a fever. °Get help right away if: °· You have trouble breathing. °This information is not intended to replace advice given to you by your health care provider. Make sure you discuss any questions you have with your health care provider. °Document Released: 07/25/2013 Document Revised: 03/08/2016 Document Reviewed: 01/24/2016 °Elsevier Interactive Patient Education © 2018 [employer] Inc[employer] ° °

## 2017-05-25 NOTE — Procedures (Signed)
Interventional Radiology Procedure Note  Procedure:  Antegrade placement of right ureteral double J stent.  26cm placed.    Findings:  No significant bleeding was encountered, with excellent drainage of the collecting system through the stent observed.  Thus, the safety PCN was removed.     Complications: None  Recommendations:  - Routine wound care at the skin - 1 hour DC home - Do not submerge for 7 days - Follow up as scheduled   Signed,  Dulcy Fanny. Earleen Newport, DO

## 2017-06-07 ENCOUNTER — Ambulatory Visit (HOSPITAL_COMMUNITY)
Admission: RE | Admit: 2017-06-07 | Discharge: 2017-06-07 | Disposition: A | Discharge status: Home / Self Care | Payer: Medicare Other | Source: Ambulatory Visit | Attending: Oncology | Admitting: Oncology

## 2017-06-07 DIAGNOSIS — C679 Malignant neoplasm of bladder, unspecified: Secondary | ICD-10-CM

## 2017-06-07 DIAGNOSIS — N1330 Unspecified hydronephrosis: Secondary | ICD-10-CM | POA: Diagnosis not present

## 2017-06-07 DIAGNOSIS — C775 Secondary and unspecified malignant neoplasm of intrapelvic lymph nodes: Secondary | ICD-10-CM | POA: Diagnosis not present

## 2017-06-07 DIAGNOSIS — Z79899 Other long term (current) drug therapy: Secondary | ICD-10-CM | POA: Insufficient documentation

## 2017-06-07 LAB — GLUCOSE, CAPILLARY: Glucose-Capillary: 127 mg/dL — ABNORMAL HIGH (ref 65–99)

## 2017-06-07 MED ORDER — FLUDEOXYGLUCOSE F - 18 (FDG) INJECTION
8.1400 | Freq: Once | INTRAVENOUS | Status: AC | PRN
  Administered 2017-06-07: 8.14 via INTRAVENOUS

## 2017-06-08 DIAGNOSIS — I10 Essential (primary) hypertension: Secondary | ICD-10-CM | POA: Diagnosis not present

## 2017-06-08 DIAGNOSIS — I739 Peripheral vascular disease, unspecified: Secondary | ICD-10-CM | POA: Diagnosis not present

## 2017-06-08 DIAGNOSIS — M109 Gout, unspecified: Secondary | ICD-10-CM | POA: Diagnosis not present

## 2017-06-08 DIAGNOSIS — I25119 Atherosclerotic heart disease of native coronary artery with unspecified angina pectoris: Secondary | ICD-10-CM | POA: Diagnosis not present

## 2017-06-08 DIAGNOSIS — C679 Malignant neoplasm of bladder, unspecified: Secondary | ICD-10-CM | POA: Diagnosis not present

## 2017-06-08 DIAGNOSIS — K219 Gastro-esophageal reflux disease without esophagitis: Secondary | ICD-10-CM | POA: Diagnosis not present

## 2017-06-08 DIAGNOSIS — Z23 Encounter for immunization: Secondary | ICD-10-CM | POA: Diagnosis not present

## 2017-06-08 DIAGNOSIS — I5022 Chronic systolic (congestive) heart failure: Secondary | ICD-10-CM | POA: Diagnosis not present

## 2017-06-08 DIAGNOSIS — E1165 Type 2 diabetes mellitus with hyperglycemia: Secondary | ICD-10-CM | POA: Diagnosis not present

## 2017-06-08 DIAGNOSIS — N183 Chronic kidney disease, stage 3 (moderate): Secondary | ICD-10-CM | POA: Diagnosis not present

## 2017-06-08 DIAGNOSIS — E782 Mixed hyperlipidemia: Secondary | ICD-10-CM | POA: Diagnosis not present

## 2017-06-08 DIAGNOSIS — Z794 Long term (current) use of insulin: Secondary | ICD-10-CM | POA: Diagnosis not present

## 2017-06-09 DIAGNOSIS — N183 Chronic kidney disease, stage 3 (moderate): Secondary | ICD-10-CM | POA: Diagnosis not present

## 2017-06-10 DIAGNOSIS — N183 Chronic kidney disease, stage 3 (moderate): Secondary | ICD-10-CM | POA: Diagnosis not present

## 2017-06-10 DIAGNOSIS — I10 Essential (primary) hypertension: Secondary | ICD-10-CM | POA: Diagnosis not present

## 2017-06-10 DIAGNOSIS — D631 Anemia in chronic kidney disease: Secondary | ICD-10-CM | POA: Diagnosis not present

## 2017-06-10 DIAGNOSIS — N2581 Secondary hyperparathyroidism of renal origin: Secondary | ICD-10-CM | POA: Diagnosis not present

## 2017-06-21 ENCOUNTER — Encounter: Payer: Self-pay | Admitting: *Deleted

## 2017-06-22 DIAGNOSIS — M62838 Other muscle spasm: Secondary | ICD-10-CM | POA: Diagnosis not present

## 2017-06-27 DIAGNOSIS — C678 Malignant neoplasm of overlapping sites of bladder: Secondary | ICD-10-CM | POA: Diagnosis not present

## 2017-06-27 DIAGNOSIS — N312 Flaccid neuropathic bladder, not elsewhere classified: Secondary | ICD-10-CM | POA: Diagnosis not present

## 2017-06-27 DIAGNOSIS — N130 Hydronephrosis with ureteropelvic junction obstruction: Secondary | ICD-10-CM | POA: Diagnosis not present

## 2017-06-28 ENCOUNTER — Other Ambulatory Visit: Payer: Self-pay | Admitting: Urology

## 2017-06-29 ENCOUNTER — Telehealth: Payer: Self-pay

## 2017-06-29 ENCOUNTER — Ambulatory Visit (HOSPITAL_BASED_OUTPATIENT_CLINIC_OR_DEPARTMENT_OTHER): Payer: Medicare Other | Admitting: Oncology

## 2017-06-29 ENCOUNTER — Encounter: Payer: Self-pay | Admitting: *Deleted

## 2017-06-29 DIAGNOSIS — N289 Disorder of kidney and ureter, unspecified: Secondary | ICD-10-CM | POA: Diagnosis not present

## 2017-06-29 DIAGNOSIS — C675 Malignant neoplasm of bladder neck: Secondary | ICD-10-CM

## 2017-06-29 DIAGNOSIS — C674 Malignant neoplasm of posterior wall of bladder: Secondary | ICD-10-CM

## 2017-06-29 DIAGNOSIS — C775 Secondary and unspecified malignant neoplasm of intrapelvic lymph nodes: Secondary | ICD-10-CM | POA: Diagnosis not present

## 2017-06-29 DIAGNOSIS — C679 Malignant neoplasm of bladder, unspecified: Secondary | ICD-10-CM | POA: Insufficient documentation

## 2017-06-29 MED ORDER — PROCHLORPERAZINE MALEATE 10 MG PO TABS: 10.0000 mg | ORAL_TABLET | Freq: Four times a day (QID) | ORAL | 0 refills | Status: DC | PRN

## 2017-06-29 NOTE — Progress Notes (Signed)
Hematology and Oncology Follow Up Visit  NICHOLAUS STEINKE [id number] 1940-11-23 76 y.o. 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 This was diagnosed in July 2018 in the setting of a bladder diverticulum and history of superficial bladder tumor. PET/CT scan on 06/07/2017 showed pelvic adenopathy indicating stage IV disease.   Prior Therapy: Cystoscopy in July 2018 showed large tumor involving the right wall of the bladder extending halfway back to the posterior wall of the bladder up to the bladder neck. 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   Current therapy: Under evaluation to start definitive therapy.  Interim History: Mr. Cindy Hazy presents today for a follow-up visit. Since the last visit, he was seen by [facility] and his creatinine clearance at the time was measured around 30 mL/m. He was evaluated by Dr. Tresa Moore and he is under consideration for cystectomy but needs cardiac clearance. Clinically, he reports no recent complaints. He does report some pelvic discomfort and occasional hematuria. He has not reported any back pain or flank pain. He does not report any major changes in his performance status.  He does not report any headaches, blurry vision, syncope or seizures. He does not report any fevers, chills or sweats or weight loss. He does not report any chest pain, palpitation, orthopnea or leg edema. He does not report any cough, wheezing or hemoptysis. He does not report any nausea, vomiting or abdominal pain. He does not report any constipation, diarrhea or medication easy. He does not report any skeletal complaints. Remaining review of systems  unremarkable.   Medications: I have reviewed the patient's current medications.  Current Outpatient Prescriptions  Medication Sig Dispense Refill  . acetaminophen (TYLENOL) 500 MG tablet Take 1,000 mg by mouth every 8 (eight) hours as needed for moderate pain.     Marland Kitchen allopurinol (ZYLOPRIM) 100 MG tablet Take 100 mg by mouth 2 (two) times daily.     Marland Kitchen amLODipine (NORVASC) 10 MG tablet Take 10 mg by mouth every morning.     Marland Kitchen aspirin EC 81 MG EC tablet Take 1 tablet (81 mg total) by mouth daily. 1 tablet 0  . atorvastatin (LIPITOR) 20 MG tablet Take 20 mg by mouth daily at 6 PM.     . cholecalciferol (VITAMIN D) 400 units TABS tablet Take 400 Units by mouth daily.    . ciprofloxacin (CIPRO) 250 MG tablet Take 250 mg by mouth See admin instructions. Take 1 tablet twice daily until 04/17/17 then take 1 tablet once daily by mouth until procedure  0  . clopidogrel (PLAVIX) 75 MG tablet TAKE 1 TABLET BY MOUTH DAILY 90 tablet 3  . colchicine 0.6 MG tablet Take 0.6 mg by mouth 2 (two) times daily.     . Ferrous Gluconate (IRON 27 PO) Take 1 tablet by mouth every evening.    Marland Kitchen GLIPIZIDE XL 10 MG 24 hr tablet Take 10 mg by mouth 2 (two) times daily.  3  . hydrALAZINE (APRESOLINE) 50 MG tablet TAKE 1 TABLET BY MOUTH THREE TIMES DAILY AS NEEDED(WILL ONLY TAKE WHEN BLOOD PRESSURE IS RUNNING HIGH. WILL AT LEAST TAKE ONCE DAILY). 90 tablet 9  . HYDROcodone-acetaminophen (NORCO) 10-325 MG tablet Take 1-2 tablets by mouth every 4 (four) hours as needed for moderate  pain. Maximum dose per 24 hours - 8 pills 20 tablet 0  . isosorbide mononitrate (IMDUR) 120 MG 24 hr tablet Take 1 tablet (120 mg total) by mouth daily. 90 tablet 3  . LANTUS SOLOSTAR 100 UNIT/ML Solostar Pen Inject 10 Units into the skin daily.   0  . magnesium oxide (MAG-OX) 400 (241.3 Mg) MG tablet Take 400 mg by mouth every evening.     . metoprolol tartrate (LOPRESSOR) 100 MG tablet Take 0.5 tablets (50 mg total) by mouth 2 (two) times daily. 90  tablet 3  . nitroGLYCERIN (NITROSTAT) 0.4 MG SL tablet PLACE 1 TABLET UNDER THE TONGUE EVERY 5 MINUTES AS NEEDED FOR CHEST PAIN 25 tablet 5  . pantoprazole (PROTONIX) 40 MG tablet Take 1 tablet (40 mg total) by mouth daily. (Patient taking differently: Take 40 mg by mouth every other day. ) 90 tablet 3  . phenazopyridine (PYRIDIUM) 200 MG tablet Take 1 tablet (200 mg total) by mouth 3 (three) times daily as needed for pain. 20 tablet 0  . prochlorperazine (COMPAZINE) 10 MG tablet Take 1 tablet (10 mg total) by mouth every 6 (six) hours as needed for nausea or vomiting. 30 tablet 0  . torsemide (DEMADEX) 20 MG tablet TAKE 2 TABLETS BY MOUTH EVERY DAY 180 tablet 3   No current facility-administered medications for this visit.      Allergies:  Allergies  Allergen Reactions  . Other Itching and Other (See Comments)    The iv dye used in Fundus Flourescein Angiography procedure  Kidney issues  . Penicillins Hives    Augmentin Has patient had a PCN reaction causing immediate rash, facial/tongue/throat swelling, SOB or lightheadedness with hypotension: No Has patient had a PCN reaction causing severe rash involving mucus membranes or skin necrosis: No Has patient had a PCN reaction that required hospitalization: Yes Has patient had a PCN reaction occurring within the last 10 years: Yes If all of the above answers are "NO", then may proceed with Cephalosporin use.     Past Medical History, Surgical history, Social history, and Family History were reviewed and updated.  Physical Exam: Blood pressure (!) 171/47, pulse 61, temperature 98.6 F (37 C), temperature source Oral, resp. rate 17, height 5\' 6"  (1.676 m), weight 162 lb 1.6 oz (73.5 kg), SpO2 97 %. ECOG:  1 General appearance: alert and cooperative appeared without distress. Head: Normocephalic, without obvious abnormality without oral thrush or ulcers. Neck: no adenopathy Lymph nodes: Cervical, supraclavicular, and axillary nodes  normal. Heart:regular rate and rhythm, S1, S2 normal, no murmur, click, rub or gallop Lung:chest clear, no wheezing, rales, normal symmetric air entry. Abdomin: soft, non-tender, without masses or organomegaly no shifting dullness or ascites. EXT:no erythema, induration, or nodules   Lab Results: Lab Results  Component Value Date   WBC 9.2 05/25/2017   HGB 12.7 (L) 05/25/2017   HCT 36.9 (L) 05/25/2017   MCV 80.7 05/25/2017   PLT 210 05/25/2017     Chemistry      Component Value Date/Time   NA 141 05/25/2017 1146   NA 139 10/14/2014 0951   K 4.2 05/25/2017 1146   CL 104 05/25/2017 1146   CO2 27 05/25/2017 1146   BUN 47 (H) 05/25/2017 1146   BUN 45 (H) 10/14/2014 0951   CREATININE 1.80 (H) 05/25/2017 1146      Component Value Date/Time   CALCIUM 9.6 05/25/2017 1146   ALKPHOS 72 12/17/2015 2110   AST 27 12/17/2015 2110   ALT 25  12/17/2015 2110   BILITOT 0.4 12/17/2015 2110     EXAM: NUCLEAR MEDICINE PET SKULL BASE TO THIGH  TECHNIQUE: 8.14 mCi F-18 FDG was injected intravenously. Full-ring PET imaging was performed from the skull base to thigh after the radiotracer. CT data was obtained and used for attenuation correction and anatomic localization.  FASTING BLOOD GLUCOSE:  Value: 127 mg/dl  COMPARISON:  CT pelvis dated 04/06/2017  FINDINGS: NECK: No hypermetabolic lymph nodes in the neck.  CHEST: No suspicious pulmonary nodules. No hypermetabolic thoracic lymphadenopathy.  The heart is normal in size. Atherosclerotic calcifications of the aortic arch. Postsurgical changes related to prior CABG. Median sternotomy.  ABDOMEN/PELVIS: Mass-like thickening along the posterior bladder base (series 4/ image 186), likely reflecting residual tumor, although difficult to discretely measure.  Associated mild left hydroureteronephrosis. Indwelling right double pigtail ureteral stent.  Associated hypermetabolic retroperitoneal/pelvic  lymphadenopathy, including:  --7 mm short axis aortocaval node (series 4/ image 147), max SUV 7.9  --9 mm left presacral node (series 4/image 166), max SUV 5.0  --12 mm short axis right pelvic sidewall node (series 4/image 173), max SUV 10.6  SKELETON: No focal hypermetabolic activity to suggest skeletal metastasis.  IMPRESSION: Mass-like thickening along the posterior bladder base, likely reflecting residual tumor, although difficult to discretely measure.  Associated mild left hydroureteronephrosis. Indwelling right double pigtail stent.  Associated small retroperitoneal and right pelvic nodal metastases, as above.  Impression and Plan:  76 year old gentleman with the following issues:  1. 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 This was diagnosed in July 2018 in the setting of a bladder diverticulum and history of superficial bladder tumor.  PET CT scan obtained on 06/07/2017 showed residual tumor in the bladder but also retroperitoneal and pelvic adenopathy that is associated with SUV uptake highly suspicious for metastatic disease.  Given these findings I am in favor of treating him with systemic therapy immediately. I fear that his disease is systemic and will likely require systemic therapy. He is not a cis-platinum candidate but carboplatin and gemcitabine would be a reasonable option. Complications associated with this regimen was discussed today. This will include nausea, vomiting, myelosuppression, neutropenia, neutropenic sepsis among others. Neuropathy and renal dysfunction considered less likely compared to cisplatin.  After receiving 3 cycles of therapy which will conclude in November 2018, we will repeat imaging studies at that time. He has a complete response with no disease outside of the bladder, consideration for a cystectomy at that time would be  reasonable. If he is not a cystectomy candidate for his disease continues to progress systemically, different salvage regimen would be needed at that time.  He is in favor proceeding with this plan after chemotherapy education class.  2. IV access: Risks and benefits of a Port-A-Cath insertion was discussed. Bleeding, thrombosis, infection among others are possibilities and he is willing to proceed. He will hold his Plavix 5 days prior to he is procedure.  3. Antiemetics: Prescription for Compazine was made available to the patient.  4. Renal insufficiency: Creatinine clearance is around 33 mL/m. We'll continue to monitor his kidney function moving forward.  5. Follow-up: The immediate future to start chemotherapy.   Zola Button, MD 9/12/20181:59 PM

## 2017-06-29 NOTE — Progress Notes (Signed)
START ON PATHWAY REGIMEN - Bladder     A cycle is every 21 days:     Carboplatin      Gemcitabine   **Always confirm dose/schedule in your pharmacy ordering system**    Patient Characteristics: Pre Cystectomy, Clinical T2-T4a, N2-3, M0 AJCC M Category: M0 AJCC N Category: NX AJCC T Category: TX Current evidence of distant metastases<= No AJCC 8 Stage Grouping: IVA Intent of Therapy: Curative Intent, Not Discussed with Patient

## 2017-06-29 NOTE — Telephone Encounter (Signed)
Scheduled appointments per 9/12 los. Mailed patient a calender.

## 2017-07-01 ENCOUNTER — Inpatient Hospital Stay (HOSPITAL_BASED_OUTPATIENT_CLINIC_OR_DEPARTMENT_OTHER)
Admission: EM | Admit: 2017-07-01 | Discharge: 2017-07-08 | DRG: 683 | Disposition: A | Discharge status: Hospice | Payer: Medicare Other | Attending: Internal Medicine | Admitting: Internal Medicine

## 2017-07-01 ENCOUNTER — Inpatient Hospital Stay (HOSPITAL_BASED_OUTPATIENT_CLINIC_OR_DEPARTMENT_OTHER): Payer: Medicare Other

## 2017-07-01 ENCOUNTER — Encounter (HOSPITAL_BASED_OUTPATIENT_CLINIC_OR_DEPARTMENT_OTHER): Payer: Self-pay | Admitting: Emergency Medicine

## 2017-07-01 DIAGNOSIS — N390 Urinary tract infection, site not specified: Secondary | ICD-10-CM | POA: Diagnosis not present

## 2017-07-01 DIAGNOSIS — R310 Gross hematuria: Secondary | ICD-10-CM

## 2017-07-01 DIAGNOSIS — D631 Anemia in chronic kidney disease: Secondary | ICD-10-CM | POA: Diagnosis not present

## 2017-07-01 DIAGNOSIS — E1122 Type 2 diabetes mellitus with diabetic chronic kidney disease: Secondary | ICD-10-CM | POA: Diagnosis present

## 2017-07-01 DIAGNOSIS — E785 Hyperlipidemia, unspecified: Secondary | ICD-10-CM | POA: Diagnosis present

## 2017-07-01 DIAGNOSIS — M353 Polymyalgia rheumatica: Secondary | ICD-10-CM | POA: Diagnosis present

## 2017-07-01 DIAGNOSIS — N1330 Unspecified hydronephrosis: Secondary | ICD-10-CM | POA: Diagnosis not present

## 2017-07-01 DIAGNOSIS — I480 Paroxysmal atrial fibrillation: Secondary | ICD-10-CM | POA: Diagnosis present

## 2017-07-01 DIAGNOSIS — R319 Hematuria, unspecified: Secondary | ICD-10-CM | POA: Diagnosis not present

## 2017-07-01 DIAGNOSIS — R0789 Other chest pain: Secondary | ICD-10-CM | POA: Diagnosis not present

## 2017-07-01 DIAGNOSIS — R0602 Shortness of breath: Secondary | ICD-10-CM | POA: Diagnosis not present

## 2017-07-01 DIAGNOSIS — N136 Pyonephrosis: Secondary | ICD-10-CM | POA: Diagnosis present

## 2017-07-01 DIAGNOSIS — N19 Unspecified kidney failure: Secondary | ICD-10-CM | POA: Diagnosis present

## 2017-07-01 DIAGNOSIS — Z951 Presence of aortocoronary bypass graft: Secondary | ICD-10-CM | POA: Diagnosis not present

## 2017-07-01 DIAGNOSIS — I10 Essential (primary) hypertension: Secondary | ICD-10-CM | POA: Diagnosis present

## 2017-07-01 DIAGNOSIS — I25111 Atherosclerotic heart disease of native coronary artery with angina pectoris with documented spasm: Secondary | ICD-10-CM | POA: Diagnosis not present

## 2017-07-01 DIAGNOSIS — Z466 Encounter for fitting and adjustment of urinary device: Secondary | ICD-10-CM | POA: Diagnosis not present

## 2017-07-01 DIAGNOSIS — N179 Acute kidney failure, unspecified: Principal | ICD-10-CM | POA: Diagnosis present

## 2017-07-01 DIAGNOSIS — Z794 Long term (current) use of insulin: Secondary | ICD-10-CM

## 2017-07-01 DIAGNOSIS — I130 Hypertensive heart and chronic kidney disease with heart failure and stage 1 through stage 4 chronic kidney disease, or unspecified chronic kidney disease: Secondary | ICD-10-CM | POA: Diagnosis not present

## 2017-07-01 DIAGNOSIS — I252 Old myocardial infarction: Secondary | ICD-10-CM

## 2017-07-01 DIAGNOSIS — Z515 Encounter for palliative care: Secondary | ICD-10-CM

## 2017-07-01 DIAGNOSIS — J449 Chronic obstructive pulmonary disease, unspecified: Secondary | ICD-10-CM | POA: Diagnosis present

## 2017-07-01 DIAGNOSIS — R9431 Abnormal electrocardiogram [ECG] [EKG]: Secondary | ICD-10-CM

## 2017-07-01 DIAGNOSIS — D649 Anemia, unspecified: Secondary | ICD-10-CM | POA: Diagnosis present

## 2017-07-01 DIAGNOSIS — K219 Gastro-esophageal reflux disease without esophagitis: Secondary | ICD-10-CM | POA: Diagnosis present

## 2017-07-01 DIAGNOSIS — I25110 Atherosclerotic heart disease of native coronary artery with unstable angina pectoris: Secondary | ICD-10-CM | POA: Diagnosis present

## 2017-07-01 DIAGNOSIS — J431 Panlobular emphysema: Secondary | ICD-10-CM | POA: Diagnosis not present

## 2017-07-01 DIAGNOSIS — Z880 Allergy status to penicillin: Secondary | ICD-10-CM

## 2017-07-01 DIAGNOSIS — E1165 Type 2 diabetes mellitus with hyperglycemia: Secondary | ICD-10-CM | POA: Diagnosis not present

## 2017-07-01 DIAGNOSIS — G4733 Obstructive sleep apnea (adult) (pediatric): Secondary | ICD-10-CM | POA: Diagnosis present

## 2017-07-01 DIAGNOSIS — N139 Obstructive and reflux uropathy, unspecified: Secondary | ICD-10-CM | POA: Diagnosis not present

## 2017-07-01 DIAGNOSIS — E1121 Type 2 diabetes mellitus with diabetic nephropathy: Secondary | ICD-10-CM | POA: Diagnosis not present

## 2017-07-01 DIAGNOSIS — Z85828 Personal history of other malignant neoplasm of skin: Secondary | ICD-10-CM

## 2017-07-01 DIAGNOSIS — R079 Chest pain, unspecified: Secondary | ICD-10-CM | POA: Diagnosis not present

## 2017-07-01 DIAGNOSIS — R778 Other specified abnormalities of plasma proteins: Secondary | ICD-10-CM

## 2017-07-01 DIAGNOSIS — Z981 Arthrodesis status: Secondary | ICD-10-CM

## 2017-07-01 DIAGNOSIS — C7919 Secondary malignant neoplasm of other urinary organs: Secondary | ICD-10-CM | POA: Diagnosis not present

## 2017-07-01 DIAGNOSIS — N189 Chronic kidney disease, unspecified: Secondary | ICD-10-CM

## 2017-07-01 DIAGNOSIS — J9601 Acute respiratory failure with hypoxia: Secondary | ICD-10-CM | POA: Diagnosis not present

## 2017-07-01 DIAGNOSIS — I5030 Unspecified diastolic (congestive) heart failure: Secondary | ICD-10-CM | POA: Diagnosis not present

## 2017-07-01 DIAGNOSIS — R7989 Other specified abnormal findings of blood chemistry: Secondary | ICD-10-CM

## 2017-07-01 DIAGNOSIS — N3289 Other specified disorders of bladder: Secondary | ICD-10-CM | POA: Diagnosis not present

## 2017-07-01 DIAGNOSIS — C772 Secondary and unspecified malignant neoplasm of intra-abdominal lymph nodes: Secondary | ICD-10-CM | POA: Diagnosis not present

## 2017-07-01 DIAGNOSIS — Z7189 Other specified counseling: Secondary | ICD-10-CM | POA: Diagnosis not present

## 2017-07-01 DIAGNOSIS — R748 Abnormal levels of other serum enzymes: Secondary | ICD-10-CM

## 2017-07-01 DIAGNOSIS — E875 Hyperkalemia: Secondary | ICD-10-CM | POA: Diagnosis not present

## 2017-07-01 DIAGNOSIS — C670 Malignant neoplasm of trigone of bladder: Secondary | ICD-10-CM | POA: Diagnosis not present

## 2017-07-01 DIAGNOSIS — J9600 Acute respiratory failure, unspecified whether with hypoxia or hypercapnia: Secondary | ICD-10-CM | POA: Diagnosis not present

## 2017-07-01 DIAGNOSIS — Z7902 Long term (current) use of antithrombotics/antiplatelets: Secondary | ICD-10-CM

## 2017-07-01 DIAGNOSIS — I4510 Unspecified right bundle-branch block: Secondary | ICD-10-CM | POA: Diagnosis present

## 2017-07-01 DIAGNOSIS — C7911 Secondary malignant neoplasm of bladder: Secondary | ICD-10-CM | POA: Diagnosis not present

## 2017-07-01 DIAGNOSIS — N183 Chronic kidney disease, stage 3 unspecified: Secondary | ICD-10-CM

## 2017-07-01 DIAGNOSIS — I509 Heart failure, unspecified: Secondary | ICD-10-CM | POA: Diagnosis not present

## 2017-07-01 DIAGNOSIS — I5032 Chronic diastolic (congestive) heart failure: Secondary | ICD-10-CM | POA: Diagnosis present

## 2017-07-01 DIAGNOSIS — I5042 Chronic combined systolic (congestive) and diastolic (congestive) heart failure: Secondary | ICD-10-CM | POA: Diagnosis present

## 2017-07-01 DIAGNOSIS — I214 Non-ST elevation (NSTEMI) myocardial infarction: Secondary | ICD-10-CM | POA: Diagnosis not present

## 2017-07-01 DIAGNOSIS — Z8349 Family history of other endocrine, nutritional and metabolic diseases: Secondary | ICD-10-CM

## 2017-07-01 DIAGNOSIS — Z7982 Long term (current) use of aspirin: Secondary | ICD-10-CM

## 2017-07-01 DIAGNOSIS — Z436 Encounter for attention to other artificial openings of urinary tract: Secondary | ICD-10-CM | POA: Diagnosis not present

## 2017-07-01 DIAGNOSIS — D638 Anemia in other chronic diseases classified elsewhere: Secondary | ICD-10-CM | POA: Diagnosis not present

## 2017-07-01 DIAGNOSIS — Z8551 Personal history of malignant neoplasm of bladder: Secondary | ICD-10-CM | POA: Diagnosis not present

## 2017-07-01 DIAGNOSIS — M109 Gout, unspecified: Secondary | ICD-10-CM | POA: Diagnosis present

## 2017-07-01 DIAGNOSIS — I2510 Atherosclerotic heart disease of native coronary artery without angina pectoris: Secondary | ICD-10-CM | POA: Diagnosis not present

## 2017-07-01 DIAGNOSIS — R590 Localized enlarged lymph nodes: Secondary | ICD-10-CM | POA: Diagnosis present

## 2017-07-01 DIAGNOSIS — N3001 Acute cystitis with hematuria: Secondary | ICD-10-CM | POA: Diagnosis not present

## 2017-07-01 DIAGNOSIS — R0600 Dyspnea, unspecified: Secondary | ICD-10-CM

## 2017-07-01 DIAGNOSIS — Z79899 Other long term (current) drug therapy: Secondary | ICD-10-CM

## 2017-07-01 DIAGNOSIS — C679 Malignant neoplasm of bladder, unspecified: Secondary | ICD-10-CM | POA: Diagnosis not present

## 2017-07-01 DIAGNOSIS — Z8249 Family history of ischemic heart disease and other diseases of the circulatory system: Secondary | ICD-10-CM

## 2017-07-01 DIAGNOSIS — E860 Dehydration: Secondary | ICD-10-CM | POA: Diagnosis not present

## 2017-07-01 DIAGNOSIS — Z87891 Personal history of nicotine dependence: Secondary | ICD-10-CM

## 2017-07-01 LAB — CBC
HCT: 31.5 % — ABNORMAL LOW (ref 39.0–52.0)
Hemoglobin: 10.7 g/dL — ABNORMAL LOW (ref 13.0–17.0)
MCH: 27.7 pg (ref 26.0–34.0)
MCHC: 34 g/dL (ref 30.0–36.0)
MCV: 81.6 fL (ref 78.0–100.0)
PLATELETS: 268 10*3/uL (ref 150–400)
RBC: 3.86 MIL/uL — ABNORMAL LOW (ref 4.22–5.81)
RDW: 14.5 % (ref 11.5–15.5)
WBC: 15.1 10*3/uL — AB (ref 4.0–10.5)

## 2017-07-01 LAB — TROPONIN I
Troponin I: 0.07 ng/mL (ref <0.03)
Troponin I: 0.13 ng/mL (ref <0.03)

## 2017-07-01 LAB — URINALYSIS, ROUTINE W REFLEX MICROSCOPIC

## 2017-07-01 LAB — BASIC METABOLIC PANEL
ANION GAP: 8 (ref 5–15)
ANION GAP: 12 (ref 5–15)
BUN: 42 mg/dL — ABNORMAL HIGH (ref 6–20)
BUN: 38 mg/dL — ABNORMAL HIGH (ref 6–20)
CALCIUM: 8.9 mg/dL (ref 8.9–10.3)
CHLORIDE: 99 mmol/L — AB (ref 101–111)
CO2: 25 mmol/L (ref 22–32)
CO2: 25 mmol/L (ref 22–32)
Calcium: 8.7 mg/dL — ABNORMAL LOW (ref 8.9–10.3)
Chloride: 98 mmol/L — ABNORMAL LOW (ref 101–111)
Creatinine, Ser: 2.42 mg/dL — ABNORMAL HIGH (ref 0.61–1.24)
Creatinine, Ser: 2.07 mg/dL — ABNORMAL HIGH (ref 0.61–1.24)
GFR calc Af Amer: 28 mL/min — ABNORMAL LOW (ref >60)
GFR calc non Af Amer: 30 mL/min — ABNORMAL LOW (ref >60)
GFR, EST AFRICAN AMERICAN: 34 mL/min — AB (ref >60)
GFR, EST NON AFRICAN AMERICAN: 25 mL/min — AB (ref >60)
GLUCOSE: 244 mg/dL — AB (ref 65–99)
Glucose, Bld: 205 mg/dL — ABNORMAL HIGH (ref 65–99)
POTASSIUM: 4.6 mmol/L (ref 3.5–5.1)
Potassium: 6 mmol/L — ABNORMAL HIGH (ref 3.5–5.1)
Sodium: 131 mmol/L — ABNORMAL LOW (ref 135–145)
Sodium: 136 mmol/L (ref 135–145)

## 2017-07-01 LAB — GLUCOSE, CAPILLARY
GLUCOSE-CAPILLARY: 196 mg/dL — AB (ref 65–99)
GLUCOSE-CAPILLARY: 82 mg/dL (ref 65–99)

## 2017-07-01 LAB — URINALYSIS, MICROSCOPIC (REFLEX): Squamous Epithelial / LPF: NONE SEEN

## 2017-07-01 MED ORDER — NITROGLYCERIN 0.4 MG SL SUBL
0.4000 mg | SUBLINGUAL_TABLET | SUBLINGUAL | Status: DC | PRN
  Administered 2017-07-01 (×2): 0.4 mg via SUBLINGUAL
  Filled 2017-07-01: qty 1

## 2017-07-01 MED ORDER — ISOSORBIDE MONONITRATE ER 60 MG PO TB24
120.0000 mg | ORAL_TABLET | Freq: Every day | ORAL | Status: DC
  Administered 2017-07-01 – 2017-07-08 (×8): 120 mg via ORAL
  Filled 2017-07-01 (×8): qty 2

## 2017-07-01 MED ORDER — HYDRALAZINE HCL 50 MG PO TABS
50.0000 mg | ORAL_TABLET | Freq: Two times a day (BID) | ORAL | Status: DC
  Administered 2017-07-01 – 2017-07-03 (×4): 50 mg via ORAL
  Filled 2017-07-01 (×4): qty 1

## 2017-07-01 MED ORDER — FERROUS GLUCONATE 324 (38 FE) MG PO TABS
324.0000 mg | ORAL_TABLET | Freq: Every day | ORAL | Status: DC
  Administered 2017-07-02 – 2017-07-08 (×7): 324 mg via ORAL
  Filled 2017-07-01 (×7): qty 1

## 2017-07-01 MED ORDER — SODIUM POLYSTYRENE SULFONATE 15 GM/60ML PO SUSP
30.0000 g | Freq: Once | ORAL | Status: DC
  Filled 2017-07-01: qty 120

## 2017-07-01 MED ORDER — SODIUM POLYSTYRENE SULFONATE 15 GM/60ML PO SUSP
15.0000 g | Freq: Once | ORAL | Status: AC
  Administered 2017-07-01: 15 g via ORAL
  Filled 2017-07-01: qty 60

## 2017-07-01 MED ORDER — INSULIN GLARGINE 100 UNIT/ML SOLOSTAR PEN: 10.0000 [IU] | PEN_INJECTOR | Freq: Every day | SUBCUTANEOUS | Status: DC

## 2017-07-01 MED ORDER — ACETAMINOPHEN 650 MG RE SUPP: 650.0000 mg | Freq: Four times a day (QID) | RECTAL | Status: DC | PRN

## 2017-07-01 MED ORDER — DEXTROSE 5 % IV SOLN
1.0000 g | INTRAVENOUS | Status: AC
  Administered 2017-07-02 – 2017-07-06 (×5): 1 g via INTRAVENOUS
  Filled 2017-07-01 (×6): qty 10

## 2017-07-01 MED ORDER — SODIUM CHLORIDE 0.9 % IV BOLUS (SEPSIS)
500.0000 mL | Freq: Once | INTRAVENOUS | Status: AC
  Administered 2017-07-01: 500 mL via INTRAVENOUS

## 2017-07-01 MED ORDER — CHOLECALCIFEROL 10 MCG (400 UNIT) PO TABS
400.0000 [IU] | ORAL_TABLET | Freq: Every day | ORAL | Status: DC
  Administered 2017-07-01 – 2017-07-07 (×7): 400 [IU] via ORAL
  Filled 2017-07-01 (×9): qty 1

## 2017-07-01 MED ORDER — SODIUM CHLORIDE 0.9 % IV SOLN
1.0000 g | Freq: Once | INTRAVENOUS | Status: DC
  Filled 2017-07-01: qty 10

## 2017-07-01 MED ORDER — SODIUM CHLORIDE 0.9 % IV SOLN
Freq: Once | INTRAVENOUS | Status: AC
  Administered 2017-07-01: 13:00:00 via INTRAVENOUS

## 2017-07-01 MED ORDER — ONDANSETRON HCL 4 MG PO TABS: 4.0000 mg | ORAL_TABLET | Freq: Four times a day (QID) | ORAL | Status: DC | PRN

## 2017-07-01 MED ORDER — ISOSORBIDE MONONITRATE ER 60 MG PO TB24: 120.0000 mg | ORAL_TABLET | Freq: Every day | ORAL | Status: DC

## 2017-07-01 MED ORDER — AMLODIPINE BESYLATE 10 MG PO TABS
10.0000 mg | ORAL_TABLET | Freq: Every day | ORAL | Status: DC
  Administered 2017-07-02 – 2017-07-08 (×7): 10 mg via ORAL
  Filled 2017-07-01 (×7): qty 1

## 2017-07-01 MED ORDER — DEXTROSE 50 % IV SOLN
25.0000 g | Freq: Once | INTRAVENOUS | Status: AC
  Administered 2017-07-01: 25 g via INTRAVENOUS
  Filled 2017-07-01: qty 50

## 2017-07-01 MED ORDER — MAGNESIUM OXIDE 400 (241.3 MG) MG PO TABS
400.0000 mg | ORAL_TABLET | Freq: Every evening | ORAL | Status: DC
  Administered 2017-07-01 – 2017-07-07 (×7): 400 mg via ORAL
  Filled 2017-07-01 (×7): qty 1

## 2017-07-01 MED ORDER — ATORVASTATIN CALCIUM 20 MG PO TABS
20.0000 mg | ORAL_TABLET | Freq: Every day | ORAL | Status: DC
  Administered 2017-07-01 – 2017-07-07 (×7): 20 mg via ORAL
  Filled 2017-07-01 (×7): qty 1

## 2017-07-01 MED ORDER — FERROUS GLUCONATE 240 (27 FE) MG PO TABS: 240.0000 mg | ORAL_TABLET | Freq: Every day | ORAL | Status: DC

## 2017-07-01 MED ORDER — SODIUM BICARBONATE 8.4 % IV SOLN
50.0000 meq | Freq: Once | INTRAVENOUS | Status: AC
  Administered 2017-07-01: 50 meq via INTRAVENOUS
  Filled 2017-07-01: qty 50

## 2017-07-01 MED ORDER — METOPROLOL TARTRATE 50 MG PO TABS
50.0000 mg | ORAL_TABLET | Freq: Two times a day (BID) | ORAL | Status: DC
  Administered 2017-07-01 – 2017-07-08 (×15): 50 mg via ORAL
  Filled 2017-07-01 (×15): qty 1

## 2017-07-01 MED ORDER — ONDANSETRON HCL 4 MG/2ML IJ SOLN: 4.0000 mg | Freq: Four times a day (QID) | INTRAMUSCULAR | Status: DC | PRN

## 2017-07-01 MED ORDER — INSULIN ASPART 100 UNIT/ML ~~LOC~~ SOLN
0.0000 [IU] | Freq: Three times a day (TID) | SUBCUTANEOUS | Status: DC
  Administered 2017-07-01 – 2017-07-04 (×7): 2 [IU] via SUBCUTANEOUS
  Administered 2017-07-05: 3 [IU] via SUBCUTANEOUS
  Administered 2017-07-05: 5 [IU] via SUBCUTANEOUS
  Administered 2017-07-05: 3 [IU] via SUBCUTANEOUS
  Administered 2017-07-06 – 2017-07-07 (×4): 2 [IU] via SUBCUTANEOUS
  Administered 2017-07-07: 3 [IU] via SUBCUTANEOUS
  Administered 2017-07-07 – 2017-07-08 (×3): 2 [IU] via SUBCUTANEOUS

## 2017-07-01 MED ORDER — DEXTROSE 5 % IV SOLN
1.0000 g | Freq: Once | INTRAVENOUS | Status: AC
  Administered 2017-07-01: 1 g via INTRAVENOUS
  Filled 2017-07-01: qty 10

## 2017-07-01 MED ORDER — INSULIN GLARGINE 100 UNIT/ML ~~LOC~~ SOLN
10.0000 [IU] | Freq: Every day | SUBCUTANEOUS | Status: DC
  Filled 2017-07-01: qty 0.1

## 2017-07-01 MED ORDER — SODIUM CHLORIDE 0.9 % IV SOLN
INTRAVENOUS | Status: DC
  Administered 2017-07-02: 18:00:00 via INTRAVENOUS

## 2017-07-01 MED ORDER — ASPIRIN 81 MG PO CHEW
324.0000 mg | CHEWABLE_TABLET | Freq: Once | ORAL | Status: AC
  Administered 2017-07-01: 324 mg via ORAL
  Filled 2017-07-01: qty 4

## 2017-07-01 MED ORDER — TRAMADOL HCL 50 MG PO TABS
50.0000 mg | ORAL_TABLET | Freq: Four times a day (QID) | ORAL | Status: DC | PRN
  Administered 2017-07-08: 50 mg via ORAL
  Filled 2017-07-01: qty 1

## 2017-07-01 MED ORDER — ACETAMINOPHEN 325 MG PO TABS
650.0000 mg | ORAL_TABLET | Freq: Four times a day (QID) | ORAL | Status: DC | PRN
  Administered 2017-07-04 – 2017-07-08 (×4): 650 mg via ORAL
  Filled 2017-07-01 (×4): qty 2

## 2017-07-01 MED ORDER — ACETAMINOPHEN 500 MG PO TABS: 500.0000 mg | ORAL_TABLET | Freq: Three times a day (TID) | ORAL | Status: DC | PRN

## 2017-07-01 MED ORDER — DOCUSATE SODIUM 100 MG PO CAPS
100.0000 mg | ORAL_CAPSULE | Freq: Two times a day (BID) | ORAL | Status: DC
  Administered 2017-07-01 – 2017-07-07 (×12): 100 mg via ORAL
  Filled 2017-07-01 (×14): qty 1

## 2017-07-01 MED ORDER — SODIUM CHLORIDE 0.9 % IV SOLN: Freq: Once | INTRAVENOUS | Status: DC

## 2017-07-01 MED ORDER — INSULIN REGULAR HUMAN 100 UNIT/ML IJ SOLN
6.0000 [IU] | Freq: Once | INTRAMUSCULAR | Status: AC
  Administered 2017-07-01: 6 [IU] via INTRAVENOUS
  Filled 2017-07-01: qty 1

## 2017-07-01 NOTE — ED Notes (Signed)
ED Provider at bedside. 

## 2017-07-01 NOTE — ED Notes (Addendum)
Repeat EKG done, c/o of chest pain to mid chest, 4/10. ED MD informed

## 2017-07-01 NOTE — ED Provider Notes (Signed)
[name] DEPT [facility] Provider Note   CSN: [id number] Arrival date & time: 07/01/17  1018     History   Chief Complaint  Chief Complaint  Patient presents with  . Hematuria  . Urinary Retention    HPI [name] is a 76 y.o. male.  HPI   Patient with a past medical history significant for bladder cancer, atrial fibrillation, CKD, and CAD  presenting with hematuria. 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 Patient states that he has been in and out catheterizing himself. However starting last night he has he became obstructed. He has catheterized himself but however the catheter has clotted off due to significant blood clots. Patient has been able to get out a small amount of urine by continually rinsing out the catheter placing her back in. He is in the thickened pelvic pain due to the need to urinate. He recently restarted his Plavix on Monday after being off of it for several weeks and now with this episode of bleeding he has stopped it for about a day and half. Patient denies any fevers or chills. Denies any nausea vomiting.  Past Medical History:  Diagnosis Date  . Anemia    a. + FOBT 08/2014.  Marland Kitchen Anginal pain ([place])    comes and goes; uses nitro to relieve   . Arthritis   . Atherosclerosis of native arteries of the extremities with intermittent claudication    a. L external iliac stent 2001, bilateral SFA occlusions documented 2001.  . Atrial fibrillation ([place])   . Bladder cancer ([place])   . CAD (coronary artery disease), native coronary artery 09/24/2013   a. CABG x 6 1999. b. Interim PCIs: Angioplasty SVG-PDA 2006.Taxus DES to native LCx in 2007.DES to SVG-PDA 2007, restented 2009.NSTEMI with PCI to SVG- PDA complicated by no-reflow/inferior infarction in 2012. c. Redo CABG 2015 with SVG-PD, SVG-D1, SVG-OM1.  . Carotid artery occlusion    a.  Duplex 12/2013: mild plaque bilat, 40-59% BICA. F/u due 12/2014.  Marland Kitchen Chronic combined systolic and diastolic CHF (congestive heart failure) ([place])    a. Prior EF 45-50% in 08/2014. b. Improved EF >55% in 09/2014 at [facility].  . Chronic lower back pain   . CKD (chronic kidney disease) stage 3, GFR 30-59 ml/min   . COPD (chronic obstructive pulmonary disease) ([place])    pt states he doesn't have  . Daily headache    comes and goes pt denies daily currently   . Dysrhythmia   . Esophageal stricture    a. History of dilitation - Long-standing history of esophageal reflux   . Gait instability   . GERD (gastroesophageal reflux disease)   . Gout   . History of bronchitis   . History of gout   . History of hiatal hernia   . Hx of CABG 09/24/2013   a. 1999: LIMA to LAD, sequential SVG to diagonal 2 and diagonal 3, sequential SVG to PDA, acute marginal branch, and PL branch. b. Repeat CABG 2015 with SVG to D1, SVG to PDA, and SVG to OM.   Marland Kitchen Hyperlipidemia   . Hypertension   . Hypotonic bladder    Requiring in and out catheterization performed by the patient at home - occasional blood tinged urine (self-cath's daily for the last 2 years) and hematuria is chronic for him, followed by urology.  . MI (myocardial infarction) ([place]) 1999-02/2014   total of 7  MI per patient  . OSA (obstructive sleep apnea)    "don't wear mask"   . Polymyalgia rheumatica ([place])   . Self-catheterizes urinary bladder   . Shortness of breath dyspnea   . Skin cancer   . SVT (supraventricular tachycardia) ([place])   . Type II diabetes mellitus [facility])     Patient Active Problem List   Diagnosis Date Noted  . Renal failure 07/01/2017  . Bladder cancer ([place]) 06/29/2017  . Old MI (myocardial infarction) 08/01/2016  . Hematuria 12/18/2015  . SVT (supraventricular tachycardia) ([place])   . Polymyalgia rheumatica ([place]) 08/30/2014  . Anemia 08/30/2014  . HTN (hypertension) 08/27/2014  . Chronic diastolic heart failure ([place]) 04/22/2014  .  CKD (chronic kidney disease) stage 3, GFR 30-59 ml/min 02/26/2014  . Coronary artery disease involving coronary bypass graft of native heart with angina pectoris ([place]) 09/24/2013  . COPD (chronic obstructive pulmonary disease) ([place]) 09/24/2013  . Type 2 diabetes mellitus with vascular disease ([place]) 09/24/2013  . 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 Esophageal stricture 09/24/2013  . OSA (obstructive sleep apnea)   . Tobacco use disorder 07/30/2013  . Carotid artery disease ([place])   . Hyperlipidemia   . Atherosclerosis of native arteries of extremity with intermittent claudication [facility])     Past Surgical History:  Procedure Laterality Date  . ABDOMINAL AORTAGRAM N/A 11/27/2014   Procedure: ABDOMINAL Maxcine Ham;  Surgeon: Wellington Hampshire, MD;  Location: [place] CATH LAB;  Service: Cardiovascular;  Laterality: N/A;  . ANKLE FRACTURE SURGERY Right ~ 1977   "10 plates and 7 screws placed in it"  . ANTERIOR CERVICAL DECOMP/DISCECTOMY FUSION    . BACK SURGERY     neck surgery x 2  . CARDIAC CATHETERIZATION    . CATARACT EXTRACTION W/ INTRAOCULAR LENS  IMPLANT, BILATERAL Bilateral 2000's  . COLONOSCOPY    . CORONARY ARTERY BYPASS GRAFT  1999  . CORONARY ARTERY BYPASS GRAFT N/A 03/01/2014   Procedure: REDO CORONARY ARTERY BYPASS GRAFTING TIMES THREE, USING RIGHT GREATER SAPHENOUS VEIN VIA ENDOVEIN HARVEST.;  Surgeon: Gaye Pollack, MD;  Location: [place] OR;  Service: Open Heart Surgery;  Laterality: N/A;  . CORONARY STENT PLACEMENT     "I've had 8 put in; I presently have none" (08/26/2014)  . CYSTOSCOPY W/ RETROGRADES Bilateral 03/03/2015   Procedure: CYSTOSCOPY WITH LEFT RETROGRADE PYELOGRAM;  Surgeon: Kathie Rhodes, MD;  Location: [facility] ORS;  Service: Urology;  Laterality: Bilateral;  . CYSTOSCOPY WITH BIOPSY N/A 04/25/2017   Procedure: CYSTOSCOPY WITH BLADDER BIOPSY;  Surgeon: Kathie Rhodes, MD;  Location: [facility] ORS;  Service: Urology;  Laterality: N/A;  . ENDARTERECTOMY Left 01/30/2015   Procedure: ENDARTERECTOMY  CAROTID WITH PRIMARY CLOSURE OF ARTERY;  Surgeon: Angelia Mould, MD;  Location: [facility] OR;  Service: Vascular;  Laterality: Left;  . ESOPHAGOGASTRODUODENOSCOPY (EGD) WITH ESOPHAGEAL DILATION  X 1  . EYE SURGERY     cataract surgery bilat   . FRACTURE SURGERY Right    ankle  . INTRAOPERATIVE TRANSESOPHAGEAL ECHOCARDIOGRAM N/A 03/01/2014   Procedure: INTRAOPERATIVE TRANSESOPHAGEAL ECHOCARDIOGRAM;  Surgeon: Gaye Pollack, MD;  Location: [facility] OR;  Service: Open Heart Surgery;  Laterality: N/A;  . IR NEPHROSTOMY PLACEMENT RIGHT  05/11/2017  . IR URETERAL STENT PLACEMENT EXISTING ACCESS RIGHT  05/25/2017  . LEFT HEART CATHETERIZATION WITH CORONARY/GRAFT ANGIOGRAM  02/26/2014   Procedure: LEFT HEART CATHETERIZATION WITH Beatrix Fetters;  Surgeon: Sinclair Grooms, MD;  Location: [facility] CATH LAB;  Service: Cardiovascular;;  . LEFT HEART CATHETERIZATION WITH CORONARY/GRAFT ANGIOGRAM  N/A 07/23/2014   Procedure: LEFT HEART CATHETERIZATION WITH Beatrix Fetters;  Surgeon: Sinclair Grooms, MD;  Location: [facility] CATH LAB;  Service: Cardiovascular;  Laterality: N/A;  . LEFT HEART CATHETERIZATION WITH CORONARY/GRAFT ANGIOGRAM N/A 08/30/2014   Procedure: LEFT HEART CATHETERIZATION WITH Beatrix Fetters;  Surgeon: Leonie Man, MD;  Location: [facility] CATH LAB;  Service: Cardiovascular;  Laterality: N/A;  . MOHS SURGERY  X 2   "nose; nose"  . MULTIPLE TOOTH EXTRACTIONS    . SKIN CANCER EXCISION     "all over my head; ears; back  . TONSILLECTOMY    . TRANSURETHRAL RESECTION OF BLADDER TUMOR WITH GYRUS (TURBT-GYRUS) N/A 03/03/2015   Procedure: TRANSURETHRAL RESECTION OF BLADDER TUMOR;  Surgeon: Kathie Rhodes, MD;  [facility]S;  Service: Urology;  Laterality: N/A;  . TRANSURETHRAL RESECTION OF BLADDER TUMOR WITH GYRUS (TURBT-GYRUS) N/A 04/28/2015   Procedure: TRANSURETHRAL RESECTION OF BLADDER TUMOR ;  Surgeon: Kathie Rhodes, MD;  [facility]S;  Service: Urology;  Laterality: N/A;  .  TRANSURETHRAL RESECTION OF BLADDER TUMOR WITH GYRUS (TURBT-GYRUS) N/A 11/24/2015   Procedure: TRANSURETHRAL RESECTION OF BLADDER TUMOR WITH GYRUS (TURBT-GYRUS);  Surgeon: Kathie Rhodes, MD;  [facility];  Service: Urology;  Laterality: N/A;  . TRANSURETHRAL RESECTION OF PROSTATE N/A 04/25/2017   Procedure: TRANSURETHRAL RESECTION OF THE PROSTATE (TURP);  Surgeon: Kathie Rhodes, MD;  [facility] ORS;  Service: Urology;  Laterality: N/A;       Home Medications    Prior to Admission medications   Medication Sig Start Date End Date Taking? Authorizing Provider  acetaminophen (TYLENOL) 500 MG tablet Take 1,000 mg by mouth every 8 (eight) hours as needed for moderate pain.     [provider]  allopurinol (ZYLOPRIM) 100 MG tablet Take 100 mg by mouth 2 (two) times daily.  08/18/16   [provider]  amLODipine (NORVASC) 10 MG tablet Take 10 mg by mouth every morning.     [provider]  aspirin EC 81 MG EC tablet Take 1 tablet (81 mg total) by mouth daily. 11/19/14   Wellington Hampshire, MD  atorvastatin (LIPITOR) 20 MG tablet Take 20 mg by mouth daily at 6 PM.     [provider]  cholecalciferol (VITAMIN D) 400 units TABS tablet Take 400 Units by mouth daily.    [provider]  clopidogrel (PLAVIX) 75 MG tablet TAKE 1 TABLET BY MOUTH DAILY 02/23/17   Belva Crome, MD  colchicine 0.6 MG tablet Take 0.6 mg by mouth 2 (two) times daily.  01/27/17   [provider]  Ferrous Gluconate (IRON 27 PO) Take 1 tablet by mouth every evening.    [provider]  GLIPIZIDE XL 10 MG 24 hr tablet Take 10 mg by mouth 2 (two) times daily. 12/09/15   [provider]  hydrALAZINE (APRESOLINE) 50 MG tablet TAKE 1 TABLET BY MOUTH THREE TIMES DAILY AS NEEDED(WILL ONLY TAKE WHEN BLOOD PRESSURE IS RUNNING HIGH. WILL AT LEAST TAKE ONCE DAILY). 05/16/17   Belva Crome, MD  HYDROcodone-acetaminophen (NORCO) 10-325 MG tablet Take 1-2 tablets by mouth every  4 (four) hours as needed for moderate pain. Maximum dose per 24 hours - 8 pills 04/25/17   Kathie Rhodes, MD  isosorbide mononitrate (IMDUR) 120 MG 24 hr tablet Take 1 tablet (120 mg total) by mouth daily. 01/31/17   Belva Crome, MD  LANTUS SOLOSTAR 100 UNIT/ML Solostar Pen Inject 10 Units into the skin daily.  04/01/17  [provider]  magnesium oxide (MAG-OX) 400 (241.3 Mg) MG tablet Take 400 mg by mouth every evening.     [provider]  metoprolol tartrate (LOPRESSOR) 100 MG tablet Take 0.5 tablets (50 mg total) by mouth 2 (two) times daily. 03/22/17   Wellington Hampshire, MD  nitroGLYCERIN (NITROSTAT) 0.4 MG SL tablet PLACE 1 TABLET UNDER THE TONGUE EVERY 5 MINUTES AS NEEDED FOR CHEST PAIN 08/27/16   Belva Crome, MD  pantoprazole (PROTONIX) 40 MG tablet Take 1 tablet (40 mg total) by mouth daily. Patient taking differently: Take 40 mg by mouth every other day.  03/07/17   Belva Crome, MD  phenazopyridine (PYRIDIUM) 200 MG tablet Take 1 tablet (200 mg total) by mouth 3 (three) times daily as needed for pain. 04/25/17   Kathie Rhodes, MD  prochlorperazine (COMPAZINE) 10 MG tablet Take 1 tablet (10 mg total) by mouth every 6 (six) hours as needed for nausea or vomiting. 06/29/17   Wyatt Portela, MD  torsemide (DEMADEX) 20 MG tablet TAKE 2 TABLETS BY MOUTH EVERY DAY 03/21/17   Belva Crome, MD    Family History Family History  Problem Relation Age of Onset  . CAD Mother   . Heart disease Mother   . Hypertension Mother   . Cirrhosis Brother   . Heart disease Father   . Hyperlipidemia Father   . Hypertension Father   . Heart attack Father     Social History Social History  Substance Use Topics  . Smoking status: Former Smoker    Packs/day: 2.00    Years: 50.00    Types: Cigarettes    Quit date: 09/18/2015  . Smokeless tobacco: Never Used  . 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    Physical Exam Updated Vital Signs BP (!) 161/59   Pulse 81   Temp 98.4 F (36.9 C)   Resp 13   Ht 5\' 6"  (1.676 m)   Wt 73.5 kg (162 lb)   SpO2 99%   BMI 26.15 kg/m   Physical Exam  Constitutional: He is oriented to person, place, and time. He appears well-developed and well-nourished.  HENT:  Head: Normocephalic and atraumatic.  Eyes: Pupils are equal, round, and reactive to light. Conjunctivae are normal.  Neck: Normal range of motion. Neck supple.  Pulmonary/Chest: Effort normal.  Abdominal: Soft. Bowel sounds are normal.  Patient is exquisitely tender in the pelvic area  Musculoskeletal: Normal range of motion.  Neurological: He is alert and oriented to person, place, and time.  Skin: Skin is warm. Capillary refill takes less than 2 seconds.     ED Treatments / Results  Labs (all labs ordered are listed, but only abnormal results are displayed) Labs Reviewed  CBC - Abnormal; Notable for the following:       Result Value   WBC 15.1 (*)    RBC 3.86 (*)    Hemoglobin 10.7 (*)    HCT 31.5 (*)    All other components within normal limits  BASIC METABOLIC PANEL - Abnormal; Notable for the following:    Sodium 131 (*)    Potassium 6.0 (*)    Chloride 98 (*)    Glucose, Bld 244 (*)    BUN 42 (*)  Creatinine, Ser 2.42 (*)    GFR calc non Af Amer 25 (*)    GFR calc Af Amer 28 (*)    All other components within normal limits  URINALYSIS, ROUTINE W REFLEX MICROSCOPIC - Abnormal; Notable for the following:    Color, Urine RED (*)    APPearance TURBID (*)    Glucose, UA   (*)    Value: TEST NOT REPORTED DUE TO COLOR INTERFERENCE OF URINE PIGMENT   Hgb urine dipstick   (*)    Value: TEST NOT REPORTED DUE TO COLOR INTERFERENCE OF URINE PIGMENT   Bilirubin Urine   (*)    Value: TEST NOT REPORTED DUE TO COLOR INTERFERENCE OF URINE PIGMENT   Ketones, ur   (*)     Value: TEST NOT REPORTED DUE TO COLOR INTERFERENCE OF URINE PIGMENT   Protein, ur   (*)    Value: TEST NOT REPORTED DUE TO COLOR INTERFERENCE OF URINE PIGMENT   Nitrite   (*)    Value: TEST NOT REPORTED DUE TO COLOR INTERFERENCE OF URINE PIGMENT   Leukocytes, UA   (*)    Value: TEST NOT REPORTED DUE TO COLOR INTERFERENCE OF URINE PIGMENT   All other components within normal limits  URINALYSIS, MICROSCOPIC (REFLEX) - Abnormal; Notable for the following:    Bacteria, UA MANY (*)    All other components within normal limits  URINE CULTURE  TROPONIN I    EKG  EKG Interpretation  Date/Time:  Friday July 01 2017 13:01:45 EDT Ventricular Rate:  64 PR Interval:    QRS Duration: 132 QT Interval:  417 QTC Calculation: 431 R Axis:   -88 Text Interpretation:  Sinus rhythm Right bundle branch block Non-specific ST-t changes Confirmed by Lajean Saver [phone]) on 07/01/2017 1:13:41 PM       Radiology Dg Chest Port 1 View  Result Date: 07/01/2017 CLINICAL DATA:  History of bladder cancer. EXAM: PORTABLE CHEST 1 VIEW COMPARISON:  PET-CT 06/07/2017. FINDINGS: Prior CABG. Cardiomegaly with pulmonary vascular prominence. Mild interstitial prominence. Mild component congestive heart failure cannot be excluded. No pleural effusion or pneumothorax. IMPRESSION: Prior CABG. A mild component of congestive heart failure cannot be excluded. Electronically Signed   By: Marcello Moores  Register   On: 07/01/2017 15:02    Procedures Procedures (including critical care time)  Medications Ordered in ED Medications  0.9 %  sodium chloride infusion (not administered)  nitroGLYCERIN (NITROSTAT) SL tablet 0.4 mg (0.4 mg Sublingual Given 07/01/17 1447)  cefTRIAXone (ROCEPHIN) 1 g in dextrose 5 % 50 mL IVPB (0 g Intravenous Stopped 07/01/17 1405)  sodium bicarbonate injection 50 mEq (50 mEq Intravenous Given 07/01/17 1421)  dextrose 50 % solution 25 g (25 g Intravenous Given 07/01/17 1421)  insulin regular (NOVOLIN  R,HUMULIN R) 100 units/mL injection 6 Units (6 Units Intravenous Given 07/01/17 1429)  sodium chloride 0.9 % bolus 500 mL (500 mLs Intravenous Transfusing/Transfer 07/01/17 1434)  sodium polystyrene (KAYEXALATE) 15 GM/60ML suspension 15 g (15 g Oral Given 07/01/17 1431)  aspirin chewable tablet 324 mg (324 mg Oral Given 07/01/17 1439)     Initial Impression / Assessment and Plan / ED Course  I have reviewed the triage vital signs and the nursing notes.  Pertinent labs & imaging results that were available during my care of the patient were reviewed by me and considered in my medical decision making (see chart for details).  Patient with past medical history of bladder cancer was presented with significant hematuria and urinary obstruction.  Patient was exquisitely tender on initial examination of abdomen with blood clots throughout the urine catheter. An 48 French Foley catheter was placed and urine was extracted. Bladder was flushed twice successfully. Patients bladder pain was much relieved. CBC returned with a 2 point drop in hemoglobin. Patient was found to have an AKI as well as an elevated potassium. Urology was consulted and agreed that patient should be admitted for observation.  EKG was obtained and was not notable for peaked T waves. Patient received treatment for elevated potassium fluid resuscitation a as well as sodium bicarbonate and insulin.   After administration of sodium bicarbonate D50 patient began to have chest pain. He stated that this chest pain was similar to the chest pain he had with previous MIs. A repeat EKG was obtained and was noted to have ST depressions. Patient received aspirin 325 and nitroglycerin. Troponin and chest x-ray was obtained. Pain was relieved by nitroglycerin and chest pain resolved. Cardiology was consulted, per discussion with them shouldn't is appropriate for [facility] as they would not want to do a cardiac cath and the fact that he has had significant  bladder bleeding and would be medically managed at this point.  Discussed patient's acute change in status with hospitalists again.   Clinical Course as of Jul 01 1522  Fri Jul 01, 2017  1247 Potassium: (!) 6.0 [AM]    Clinical Course User Index [AM] Tonette Bihari, MD    Final Clinical Impressions(s) / ED Diagnoses   Final diagnoses:  Acute respiratory failure ([place])  Hematuria, unspecified type  Abnormal ECG  Chest pain, unspecified type  Acute chest pain    New Prescriptions New Prescriptions   No medications on file     Tonette Bihari, MD 07/01/17 1539    Lajean Saver, MD 07/01/17 1601

## 2017-07-01 NOTE — ED Triage Notes (Signed)
Pt has bladder cancer and has hematuria.   He straight caths but the clots in urine have been obstructing his ability to urinate.

## 2017-07-01 NOTE — Progress Notes (Addendum)
Called by EDP regarding Mr. Lococo, 76 year old with history of bladder cancer, coronary artery disease, chronic combined systolic and diastolic CHF, chronic kidney disease stage III, presenting with hematuria and acute urinary obstruction.  Was also noted to have elevation in creatinine as well as hyperkalemia.  Urology consulted.  Accepted to Telemetry given hyperkalemia.  Update, EDP called again that patient developed chest pain and ST segment changes. Troponin mildly elevated. Chest pain resolved with nitro. Cardiology consulted and will see patient on arrival  RN, on patient arrival, please call [phone] and let patient placement RN know of the patient's arrival and a hospitalist will be assigned to admit the patient.   Costin M. Cruzita Lederer, MD [facility] [phone]  If 7PM-7AM, please contact night-coverage [email or web address] Password [facility]  11/26/2016, 5:37 AM

## 2017-07-01 NOTE — Consult Note (Signed)
Cardiology Consultation:   Patient ID: [name]; [id number]; 07/09/1941   Admit date: 07/01/2017 Date of Consult: 07/01/2017  Primary Care Provider: Shirline Frees, MD Primary Cardiologist: Daneen Schick  Patient Profile:   [name] is a 76 y.o. male with a hx of former tobacco abuse, COPD, sleep apnea, CAD s/p CABG and redo CABG, chronic combined systolic and diastolic CHF, stage 3 CKD, HTN, HLD, DM, carotid artery disease, atrial fibrillation on chronic anti-coagulation therapy, PAD who is being seen today for the evaluation of chest pain at the request of Dr. Tyrell Antonio.  History of Present Illness:   [name] is a 76 y.o. male with a hx of CAD s/p CABG and redo CABG, carotid artery disease s/p CEA, chronic combined systolic and diastolic CHF, stage 3 CKD, HTN, HLD, DM, atrial fibrillation on chronic anti-coagulation therapy, PAD, PMR, sleep apnea, bladder cancer, former tobacco abuse and COPD who is being seen today for the evaluation of chest pain at the request of Dr. Tyrell Antonio. He is followed in our office by Dr. Tamala Julian. He has complex CAD with 6V CABG in 1999 and repeat 3V CABG in 2015. He was last seen in our office in April 2018 by Dr. Tamala Julian and was doing well on optimal medical therapy. Last cardiac cath in November 2015 with severe left main/LAD stenosis but patent LIMA to LAD. The vein graft to the Diagonal branch was patent. The vein graft to the second and third diagonal branches was patent with moderate disease. The native Circumflex has severe proximal disease. The small OM branch filled from the patent vein graft. The native RCA was occluded proximally. The small right PDA filled from the patent vein graft. Last echo March 2017 with LVEF=50-55%. He has carotid artery disease with prior left CEA. He has extensive LE arterial disease with chronically occluded bilateral superficial femoral arteries and diffuse iliac artery disease, not felt to be amenable to intervention  in 2016 when Dr. Fletcher Anon performed a LE angiogram. He had a bladder tumor resection 04/25/17. He has been using in/out caths since then.   He presented to the [facility] today with c/o blood in urine and a foley catheter was placed. Bladder irrigation performed. Potassium 6.0 and creatinine 2.4. He was given Kayexalate, sodium bicarbonate with D50 and insulin and then began to have chest pain. This resolved quickly with NTG x 2. SL. EKG with sinus, RBBB and worsened ST depression in the precordial leads. Troponin 0.07. Troponin 0.13. He currently has no chest pain. He denies dyspnea, dizziness, recent LE edema or weight gain. He feels great.   Past Medical History:  Diagnosis Date  . Anemia    a. + FOBT 08/2014.  Marland Kitchen Anginal pain ([place])    comes and goes; uses nitro to relieve   . Arthritis   . Atherosclerosis of native arteries of the extremities with intermittent claudication    a. L external iliac stent 2001, bilateral SFA occlusions documented 2001.  . Atrial fibrillation ([place])   . Bladder cancer ([place])   . CAD (coronary artery disease), native coronary artery 09/24/2013   a. CABG x 6 1999. b. Interim PCIs: Angioplasty SVG-PDA 2006.Taxus DES to native LCx in 2007.DES to SVG-PDA 2007, restented 2009.NSTEMI with PCI to SVG- PDA complicated by no-reflow/inferior infarction in 2012. c. Redo CABG 2015 with SVG-PD, SVG-D1, SVG-OM1.  . Carotid artery occlusion    a. Duplex 12/2013: mild plaque bilat, 40-59% BICA. F/u due 12/2014.  Marland Kitchen Chronic combined  systolic and diastolic CHF (congestive heart failure) ([place])    a. Prior EF 45-50% in 08/2014. b. Improved EF >55% in 09/2014 at [facility].  . Chronic lower back pain   . CKD (chronic kidney disease) stage 3, GFR 30-59 ml/min   . COPD (chronic obstructive pulmonary disease) ([place])    pt states he doesn't have  . Daily headache    comes and goes pt denies daily currently   . Dysrhythmia   . Esophageal stricture    a. History of dilitation -  Long-standing history of esophageal reflux   . Gait instability   . GERD (gastroesophageal reflux disease)   . Gout   . History of bronchitis   . History of gout   . History of hiatal hernia   . Hx of CABG 09/24/2013   a. 1999: LIMA to LAD, sequential SVG to diagonal 2 and diagonal 3, sequential SVG to PDA, acute marginal branch, and PL branch. b. Repeat CABG 2015 with SVG to D1, SVG to PDA, and SVG to OM.   Marland Kitchen Hyperlipidemia   . Hypertension   . Hypotonic bladder    Requiring in and out catheterization performed by the patient at home - occasional blood tinged urine (self-cath's daily for the last 2 years) and hematuria is chronic for him, followed by urology.  . MI (myocardial infarction) ([place]) 1999-02/2014   total of 7 MI per patient  . OSA (obstructive sleep apnea)    "don't wear mask"   . Polymyalgia rheumatica ([place])   . Self-catheterizes urinary bladder   . Shortness of breath dyspnea   . Skin cancer   . SVT (supraventricular tachycardia) ([place])   . Type II diabetes mellitus ([place])     Past Surgical History:  Procedure Laterality Date  . ABDOMINAL AORTAGRAM N/A 11/27/2014   Procedure: ABDOMINAL Maxcine Ham;  Surgeon: Wellington Hampshire, MD;  Location: [facility] CATH LAB;  Service: Cardiovascular;  Laterality: N/A;  . ANKLE FRACTURE SURGERY Right ~ 1977   "10 plates and 7 screws placed in it"  . ANTERIOR CERVICAL DECOMP/DISCECTOMY FUSION    . BACK SURGERY     neck surgery x 2  . CARDIAC CATHETERIZATION    . CATARACT EXTRACTION W/ INTRAOCULAR LENS  IMPLANT, BILATERAL Bilateral 2000's  . COLONOSCOPY    . CORONARY ARTERY BYPASS GRAFT  1999  . CORONARY ARTERY BYPASS GRAFT N/A 03/01/2014   Procedure: REDO CORONARY ARTERY BYPASS GRAFTING TIMES THREE, USING RIGHT GREATER SAPHENOUS VEIN VIA ENDOVEIN HARVEST.;  Surgeon: Gaye Pollack, MD;  Location: [facility] OR;  Service: Open Heart Surgery;  Laterality: N/A;  . CORONARY STENT PLACEMENT     "I've had 8 put in; I presently have none" (08/26/2014)  .  CYSTOSCOPY W/ RETROGRADES Bilateral 03/03/2015   Procedure: CYSTOSCOPY WITH LEFT RETROGRADE PYELOGRAM;  Surgeon: Kathie Rhodes, MD;  Location: [facility] ORS;  Service: Urology;  Laterality: Bilateral;  . CYSTOSCOPY WITH BIOPSY N/A 04/25/2017   Procedure: CYSTOSCOPY WITH BLADDER BIOPSY;  Surgeon: Kathie Rhodes, MD;  Location: [facility] ORS;  Service: Urology;  Laterality: N/A;  . ENDARTERECTOMY Left 01/30/2015   Procedure: ENDARTERECTOMY CAROTID WITH PRIMARY CLOSURE OF ARTERY;  Surgeon: Angelia Mould, MD;  Location: [facility] OR;  Service: Vascular;  Laterality: Left;  . ESOPHAGOGASTRODUODENOSCOPY (EGD) WITH ESOPHAGEAL DILATION  X 1  . EYE SURGERY     cataract surgery bilat   . FRACTURE SURGERY Right    ankle  . INTRAOPERATIVE TRANSESOPHAGEAL ECHOCARDIOGRAM N/A 03/01/2014   Procedure: INTRAOPERATIVE TRANSESOPHAGEAL ECHOCARDIOGRAM;  Surgeon: Gaye Pollack, MD;  Location: [facility];  Service: Open Heart Surgery;  Laterality: N/A;  . IR NEPHROSTOMY PLACEMENT RIGHT  05/11/2017  . IR URETERAL STENT PLACEMENT EXISTING ACCESS RIGHT  05/25/2017  . LEFT HEART CATHETERIZATION WITH CORONARY/GRAFT ANGIOGRAM  02/26/2014   Procedure: LEFT HEART CATHETERIZATION WITH Beatrix Fetters;  Surgeon: Sinclair Grooms, MD;  Location: [facility] CATH LAB;  Service: Cardiovascular;;  . LEFT HEART CATHETERIZATION WITH CORONARY/GRAFT ANGIOGRAM N/A 07/23/2014   Procedure: LEFT HEART CATHETERIZATION WITH Beatrix Fetters;  Surgeon: Sinclair Grooms, MD;  Location: [facility] CATH LAB;  Service: Cardiovascular;  Laterality: N/A;  . LEFT HEART CATHETERIZATION WITH CORONARY/GRAFT ANGIOGRAM N/A 08/30/2014   Procedure: LEFT HEART CATHETERIZATION WITH Beatrix Fetters;  Surgeon: Leonie Man, MD;  Location: [facility] CATH LAB;  Service: Cardiovascular;  Laterality: N/A;  . MOHS SURGERY  X 2   "nose; nose"  . MULTIPLE TOOTH EXTRACTIONS    . SKIN CANCER EXCISION     "all over my head; ears; back  . TONSILLECTOMY    . TRANSURETHRAL RESECTION OF  BLADDER TUMOR WITH GYRUS (TURBT-GYRUS) N/A 03/03/2015   Procedure: TRANSURETHRAL RESECTION OF BLADDER TUMOR;  Surgeon: Kathie Rhodes, MD;  Location: [facility];  Service: Urology;  Laterality: N/A;  . TRANSURETHRAL RESECTION OF BLADDER TUMOR WITH GYRUS (TURBT-GYRUS) N/A 04/28/2015   Procedure: TRANSURETHRAL RESECTION OF BLADDER TUMOR ;  Surgeon: Kathie Rhodes, MD;  Location: [facility];  Service: Urology;  Laterality: N/A;  . TRANSURETHRAL RESECTION OF BLADDER TUMOR WITH GYRUS (TURBT-GYRUS) N/A 11/24/2015   Procedure: TRANSURETHRAL RESECTION OF BLADDER TUMOR WITH GYRUS (TURBT-GYRUS);  Surgeon: Kathie Rhodes, MD;  Location: [facility];  Service: Urology;  Laterality: N/A;  . TRANSURETHRAL RESECTION OF PROSTATE N/A 04/25/2017   Procedure: TRANSURETHRAL RESECTION OF THE PROSTATE (TURP);  Surgeon: Kathie Rhodes, MD;  Location: [facility];  Service: Urology;  Laterality: N/A;    Inpatient Medications: Scheduled Meds: . [START ON 07/02/2017] amLODipine  10 mg Oral Daily  . atorvastatin  20 mg Oral q1800  . cholecalciferol  400 Units Oral QHS  . docusate sodium  100 mg Oral BID  . [START ON 07/02/2017] ferrous gluconate  324 mg Oral Q breakfast  . hydrALAZINE  50 mg Oral BID  . insulin aspart  0-9 Units Subcutaneous TID WC  . [START ON 07/02/2017] insulin glargine  10 Units Subcutaneous Daily  . isosorbide mononitrate  120 mg Oral Daily  . magnesium oxide  400 mg Oral QPM  . metoprolol tartrate  50 mg Oral BID   Continuous Infusions: . sodium chloride 75 mL/hr at 07/01/17 1645  . [START ON 07/02/2017] cefTRIAXone (ROCEPHIN)  IV    . sodium chloride     PRN Meds: acetaminophen **OR** acetaminophen, nitroGLYCERIN, ondansetron **OR** ondansetron (ZOFRAN) IV, traMADol  Allergies:    Allergies  Allergen Reactions  . Other Itching and Other (See Comments)    The iv dye used in Fundus Flourescein Angiography procedure  Kidney issues  . Penicillins Hives    Augmentin Has patient had a PCN reaction causing immediate rash,  facial/tongue/throat swelling, SOB or lightheadedness with hypotension: No Has patient had a PCN reaction causing severe rash involving mucus membranes or skin necrosis: No Has patient had a PCN reaction that required hospitalization: Yes Has patient had a PCN reaction occurring within the last 10 years: Yes If all of the above answers are "NO", then may proceed with Cephalosporin use.     Social History:   Social History  Social History  . Marital status: Married    Spouse name: N/A  . Number of children: N/A  . Years of education: N/A   Occupational History  . Not on file.   Social History Main Topics  . Smoking status: Former Smoker    Packs/day: 2.00    Years: 50.00    Types: Cigarettes    Quit date: 09/18/2015  . Smokeless tobacco: Never Used  . 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 Drug use: No  . Sexual activity: Not on file   Other Topics Concern  . Not on file   Social History Narrative   Tour manager           Family History:    Family History  Problem Relation Age of Onset  . CAD Mother   . Heart disease Mother   . Hypertension Mother   . Cirrhosis Brother   . Heart disease Father   . Hyperlipidemia Father   . Hypertension Father   . Heart attack Father      ROS:  Please see the history of present illness.  ROS  All other ROS reviewed and negative.     Physical Exam/Data:   Vitals:   07/01/17 1428 07/01/17 1430 07/01/17 1500 07/01/17 1621  BP: (!) 161/59 (!) 165/62 (!) 159/62 (!) 183/61  Pulse: 81 83 81 74  Resp: 13 14 18 18   Temp:    97.9 F (36.6 C)  TempSrc:    Oral  SpO2: 99% 98% 96% 98%  Weight:    170 lb 6.7 oz (77.3 kg)  Height:    5\' 6"  (1.676 m)    Intake/Output Summary (Last 24 hours) at 07/01/17 1834 Last data filed at 07/01/17 1428  Gross per 24 hour  Intake                0 ml  Output              500 ml  Net             -500 ml   Filed Weights    07/01/17 1025 07/01/17 1621  Weight: 162 lb (73.5 kg) 170 lb 6.7 oz (77.3 kg)   Body mass index is 27.51 kg/m.  General:  Well nourished, well developed, in no acute distress HEENT: normal Lymph: no adenopathy Neck: no JVD Endocrine:  No thryomegaly Vascular: pedal pulses are not palpable Cardiac:  normal S1, S2; RRR; no murmur  Lungs:  clear to auscultation bilaterally, no wheezing, rhonchi or rales  Abd: soft, nontender, no hepatomegaly  Ext: no edema Musculoskeletal:  No deformities, BUE and BLE strength normal and equal Skin: warm and dry  Neuro:  CNs 2-12 intact, no focal abnormalities noted Psych:  Normal affect   EKG:  The EKG from 6:07pm 07/01/17  was personally reviewed and demonstrates: NSR, rate 67 pbm. RBBB. Old inferior infarct. 1 mm ST depression precordial leads, improved from EKG earlier today Telemetry:  Telemetry was personally reviewed and demonstrates:  Sinus rhythm. 67 bpm  Relevant CV Studies:   Laboratory Data:  Chemistry  Recent Labs Lab 07/01/17 1130 07/01/17 1703  NA 131* 136  K 6.0* 4.6  CL 98* 99*  CO2 25 25  GLUCOSE 244* 205*  BUN 42* 38*  CREATININE 2.42* 2.07*  CALCIUM 8.9 8.7*  GFRNONAA 25* 30*  GFRAA 28* 34*  ANIONGAP 8 12  No results for input(s): PROT, ALBUMIN, AST, ALT, ALKPHOS, BILITOT in the last 168 hours. Hematology  Recent Labs Lab 07/01/17 1130  WBC 15.1*  RBC 3.86*  HGB 10.7*  HCT 31.5*  MCV 81.6  MCH 27.7  MCHC 34.0  RDW 14.5  PLT 268   Cardiac Enzymes  Recent Labs Lab 07/01/17 1441 07/01/17 1703  TROPONINI 0.07* 0.13*   No results for input(s): TROPIPOC in the last 168 hours.  BNPNo results for input(s): BNP, PROBNP in the last 168 hours.  DDimer No results for input(s): DDIMER in the last 168 hours.  Radiology/Studies:  Dg Chest Port 1 View  Result Date: 07/01/2017 CLINICAL DATA:  History of bladder cancer. EXAM: PORTABLE CHEST 1 VIEW COMPARISON:  PET-CT 06/07/2017. FINDINGS: Prior CABG.  Cardiomegaly with pulmonary vascular prominence. Mild interstitial prominence. Mild component congestive heart failure cannot be excluded. No pleural effusion or pneumothorax. IMPRESSION: Prior CABG. A mild component of congestive heart failure cannot be excluded. Electronically Signed   By: Marcello Moores  Register   On: 07/01/2017 15:02    Assessment and Plan:   1. CAD with unstable angina/Elevated troponin: He had one episode of chest pain today while being evaluated for hematuria. This occurred while receiving infusion of D50. Resolved with 2 SL NTG. A foley catheter has been placed and Urology has seen him here at [facility]. He is having no chest pain currently. Troponin is elevated at 0.13. EKG currently shows sinus rhythm, RBBB, old inferior MI and subtle ST depression precordial leads. He is on ASA and Plavix at home. Urology ok with resuming ASA and Plavix.  I would prefer a conservative approach to his management. The patient agrees with this. Cycle troponin tonight. No IV heparin given hematuria. If he has no recurrent chest pain, would not plan invasive strategy. Monitor on telemetry overnight. We will see him in the am.    For questions or updates, please contact [facility] Please consult [email or web address] for contact info under Cardiology/STEMI.   Signed, Lauree Chandler, MD  07/01/2017 6:34 PM

## 2017-07-01 NOTE — H&P (Signed)
History and Physical    [name] FVC:[id number] DOB: [birth year] DOA: 07/01/2017  PCP: Shirline Frees, MD  Patient coming from: Home   I have personally briefly reviewed patient's old medical records in [place]  Chief Complaint: unable to empty bladder, bloody urine.   HPI: [name] is a 76 y.o. 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 Patient relates that he catheterized himself for last 5 years. The day prior to admission he started to have difficulty with catheter. The catheter got clotted with blood. He report gross hematuria and clots. He denies fever, chills. He has an episode of chest pain after he received a medication in the ED> '' Bicarb amp" . He received nitroglycerin. He is currently chest pain free. He had some ST changes in the EKG> case was reviewed with cardiology on call, who will see patient in consultation at [facility] long.   ED Course: in the ED; patient had 18 french foley catheter placed, the bladder was flushed twice.  K was at 6, cr at 2.4, WBC 15, sodium 131, hb 10, glucose 244, UA; Too numerous to count WBC.   Review of Systems: As per HPI otherwise 10 point review of systems negative.    Past Medical History:  Diagnosis Date  . Anemia    a. + FOBT 08/2014.  Marland Kitchen Anginal pain ([place])    comes and goes; uses nitro to relieve   . Arthritis   . Atherosclerosis of native arteries of the extremities with intermittent claudication    a. L external iliac stent 2001, bilateral SFA occlusions documented 2001.  . Atrial fibrillation ([place])   . Bladder cancer ([place])   . CAD (coronary artery disease), native coronary artery 09/24/2013   a. CABG x 6 1999. b. Interim PCIs: Angioplasty SVG-PDA 2006.Taxus DES to native LCx in 2007.DES to SVG-PDA 2007, restented 2009.NSTEMI with PCI to SVG- PDA  complicated by no-reflow/inferior infarction in 2012. c. Redo CABG 2015 with SVG-PD, SVG-D1, SVG-OM1.  . Carotid artery occlusion    a. Duplex 12/2013: mild plaque bilat, 40-59% BICA. F/u due 12/2014.  Marland Kitchen Chronic combined systolic and diastolic CHF (congestive heart failure) ([place])    a. Prior EF 45-50% in 08/2014. b. Improved EF >55% in 09/2014 at [facility].  . Chronic lower back pain   . CKD (chronic kidney disease) stage 3, GFR 30-59 ml/min   . COPD (chronic obstructive pulmonary disease) ([place])    pt states he doesn't have  . Daily headache    comes and goes pt denies daily currently   . Dysrhythmia   . Esophageal stricture    a. History of dilitation - Long-standing history of esophageal reflux   . Gait instability   . GERD (gastroesophageal reflux disease)   . Gout   . History of bronchitis   . History of gout   . History of hiatal hernia   . Hx of CABG 09/24/2013   a. 1999: LIMA to LAD, sequential SVG to diagonal 2 and diagonal 3, sequential SVG to PDA, acute marginal branch, and PL branch. b. Repeat CABG 2015 with SVG to D1, SVG to PDA, and SVG to OM.   Marland Kitchen Hyperlipidemia   . Hypertension   . Hypotonic bladder    Requiring in and out catheterization performed by the patient at home - occasional  blood tinged urine (self-cath's daily for the last 2 years) and hematuria is chronic for him, followed by urology.  . MI (myocardial infarction) ([place]) 1999-02/2014   total of 7 MI per patient  . OSA (obstructive sleep apnea)    "don't wear mask"   . Polymyalgia rheumatica ([place])   . Self-catheterizes urinary bladder   . Shortness of breath dyspnea   . Skin cancer   . SVT (supraventricular tachycardia) ([place])   . Type II diabetes mellitus ([place])     Past Surgical History:  Procedure Laterality Date  . ABDOMINAL AORTAGRAM N/A 11/27/2014   Procedure: ABDOMINAL Maxcine Ham;  Surgeon: Wellington Hampshire, MD;  Location: [facility] CATH LAB;  Service: Cardiovascular;  Laterality: N/A;  . ANKLE FRACTURE SURGERY  Right ~ 1977   "10 plates and 7 screws placed in it"  . ANTERIOR CERVICAL DECOMP/DISCECTOMY FUSION    . BACK SURGERY     neck surgery x 2  . CARDIAC CATHETERIZATION    . CATARACT EXTRACTION W/ INTRAOCULAR LENS  IMPLANT, BILATERAL Bilateral 2000's  . COLONOSCOPY    . CORONARY ARTERY BYPASS GRAFT  1999  . CORONARY ARTERY BYPASS GRAFT N/A 03/01/2014   Procedure: REDO CORONARY ARTERY BYPASS GRAFTING TIMES THREE, USING RIGHT GREATER SAPHENOUS VEIN VIA ENDOVEIN HARVEST.;  Surgeon: Gaye Pollack, MD;  Location: [facility] OR;  Service: Open Heart Surgery;  Laterality: N/A;  . CORONARY STENT PLACEMENT     "I've had 8 put in; I presently have none" (08/26/2014)  . CYSTOSCOPY W/ RETROGRADES Bilateral 03/03/2015   Procedure: CYSTOSCOPY WITH LEFT RETROGRADE PYELOGRAM;  Surgeon: Kathie Rhodes, MD;  Location: [facility] ORS;  Service: Urology;  Laterality: Bilateral;  . CYSTOSCOPY WITH BIOPSY N/A 04/25/2017   Procedure: CYSTOSCOPY WITH BLADDER BIOPSY;  Surgeon: Kathie Rhodes, MD;  Location: [facility] ORS;  Service: Urology;  Laterality: N/A;  . ENDARTERECTOMY Left 01/30/2015   Procedure: ENDARTERECTOMY CAROTID WITH PRIMARY CLOSURE OF ARTERY;  Surgeon: Angelia Mould, MD;  Location[facility] OR;  Service: Vascular;  Laterality: Left;  . ESOPHAGOGASTRODUODENOSCOPY (EGD) WITH ESOPHAGEAL DILATION  X 1  . EYE SURGERY     cataract surgery bilat   . FRACTURE SURGERY Right    ankle  . INTRAOPERATIVE TRANSESOPHAGEAL ECHOCARDIOGRAM N/A 03/01/2014   Procedure: INTRAOPERATIVE TRANSESOPHAGEAL ECHOCARDIOGRAM;  Surgeon: Gaye Pollack, MD;  Location: [facility] OR;  Service: Open Heart Surgery;  Laterality: N/A;  . IR NEPHROSTOMY PLACEMENT RIGHT  05/11/2017  . IR URETERAL STENT PLACEMENT EXISTING ACCESS RIGHT  05/25/2017  . LEFT HEART CATHETERIZATION WITH CORONARY/GRAFT ANGIOGRAM  02/26/2014   Procedure: LEFT HEART CATHETERIZATION WITH Beatrix Fetters;  Surgeon: Sinclair Grooms, MD;  Location: [facility] CATH LAB;  Service: Cardiovascular;;  . LEFT  HEART CATHETERIZATION WITH CORONARY/GRAFT ANGIOGRAM N/A 07/23/2014   Procedure: LEFT HEART CATHETERIZATION WITH Beatrix Fetters;  Surgeon: Sinclair Grooms, MD;  Location: [facility] CATH LAB;  Service: Cardiovascular;  Laterality: N/A;  . LEFT HEART CATHETERIZATION WITH CORONARY/GRAFT ANGIOGRAM N/A 08/30/2014   Procedure: LEFT HEART CATHETERIZATION WITH Beatrix Fetters;  Surgeon: Leonie Man, MD;  Location: [facility] CATH LAB;  Service: Cardiovascular;  Laterality: N/A;  . MOHS SURGERY  X 2   "nose; nose"  . MULTIPLE TOOTH EXTRACTIONS    . SKIN CANCER EXCISION     "all over my head; ears; back  . TONSILLECTOMY    . TRANSURETHRAL RESECTION OF BLADDER TUMOR WITH GYRUS (TURBT-GYRUS) N/A 03/03/2015   Procedure: TRANSURETHRAL RESECTION OF BLADDER TUMOR;  Surgeon: Kathie Rhodes, MD;  [facility];  Service: Urology;  Laterality: N/A;  . TRANSURETHRAL RESECTION OF BLADDER TUMOR WITH GYRUS (TURBT-GYRUS) N/A 04/28/2015   Procedure: TRANSURETHRAL RESECTION OF BLADDER TUMOR ;  Surgeon: Kathie Rhodes, MD;  [facility];  Service: Urology;  Laterality: N/A;  . TRANSURETHRAL RESECTION OF BLADDER TUMOR WITH GYRUS (TURBT-GYRUS) N/A 11/24/2015   Procedure: TRANSURETHRAL RESECTION OF BLADDER TUMOR WITH GYRUS (TURBT-GYRUS);  Surgeon: Kathie Rhodes, MD;  [facility];  Service: Urology;  Laterality: N/A;  . TRANSURETHRAL RESECTION OF PROSTATE N/A 04/25/2017   Procedure: TRANSURETHRAL RESECTION OF THE PROSTATE (TURP);  Surgeon: Kathie Rhodes, MD;  [facility];  Service: Urology;  Laterality: N/A;     reports that he quit smoking about 21 months ago. His smoking use included Cigarettes. He has a 100.00 pack-year smoking history. He has never used smokeless tobacco. He reports that he does not drink alcohol or use drugs.  Allergies  Allergen Reactions  . Other Itching and Other (See Comments)    The iv dye used in Fundus Flourescein Angiography procedure  Kidney issues  . Penicillins Hives     Augmentin Has patient had a PCN reaction causing immediate rash, facial/tongue/throat swelling, SOB or lightheadedness with hypotension: No Has patient had a PCN reaction causing severe rash involving mucus membranes or skin necrosis: No Has patient had a PCN reaction that required hospitalization: Yes Has patient had a PCN reaction occurring within the last 10 years: Yes If all of the above answers are "NO", then may proceed with Cephalosporin use.     Family History  Problem Relation Age of Onset  . CAD Mother   . Heart disease Mother   . Hypertension Mother   . Cirrhosis Brother   . Heart disease Father   . Hyperlipidemia Father   . Hypertension Father   . Heart attack Father      Prior to Admission medications   Medication Sig Start Date End Date Taking? Authorizing Provider  acetaminophen (TYLENOL) 500 MG tablet Take 1,000 mg by mouth every 8 (eight) hours as needed for moderate pain.     [provider]  allopurinol (ZYLOPRIM) 100 MG tablet Take 100 mg by mouth 2 (two) times daily.  08/18/16   [provider]  amLODipine (NORVASC) 10 MG tablet Take 10 mg by mouth every morning.     [provider]  aspirin EC 81 MG EC tablet Take 1 tablet (81 mg total) by mouth daily. 11/19/14   Wellington Hampshire, MD  atorvastatin (LIPITOR) 20 MG tablet Take 20 mg by mouth daily at 6 PM.     [provider]  cholecalciferol (VITAMIN D) 400 units TABS tablet Take 400 Units by mouth daily.    [provider]  clopidogrel (PLAVIX) 75 MG tablet TAKE 1 TABLET BY MOUTH DAILY 02/23/17   Belva Crome, MD  colchicine 0.6 MG tablet Take 0.6 mg by mouth 2 (two) times daily.  01/27/17   [provider]  Ferrous Gluconate (IRON 27 PO) Take 1 tablet by mouth every evening.    [provider]  GLIPIZIDE XL 10 MG 24 hr tablet Take 10 mg by mouth 2 (two) times daily. 12/09/15   [provider]  hydrALAZINE (APRESOLINE) 50 MG tablet TAKE 1  TABLET BY MOUTH THREE TIMES DAILY AS NEEDED(WILL ONLY TAKE WHEN BLOOD PRESSURE IS RUNNING HIGH. WILL AT LEAST TAKE ONCE DAILY). 05/16/17   Belva Crome, MD  HYDROcodone-acetaminophen [facility]) 10-325 MG tablet Take 1-2  tablets by mouth every 4 (four) hours as needed for moderate pain. Maximum dose per 24 hours - 8 pills 04/25/17   Kathie Rhodes, MD  isosorbide mononitrate (IMDUR) 120 MG 24 hr tablet Take 1 tablet (120 mg total) by mouth daily. 01/31/17   Belva Crome, MD  LANTUS SOLOSTAR 100 UNIT/ML Solostar Pen Inject 10 Units into the skin daily.  04/01/17   [provider]  magnesium oxide (MAG-OX) 400 (241.3 Mg) MG tablet Take 400 mg by mouth every evening.     [provider]  metoprolol tartrate (LOPRESSOR) 100 MG tablet Take 0.5 tablets (50 mg total) by mouth 2 (two) times daily. 03/22/17   Wellington Hampshire, MD  nitroGLYCERIN (NITROSTAT) 0.4 MG SL tablet PLACE 1 TABLET UNDER THE TONGUE EVERY 5 MINUTES AS NEEDED FOR CHEST PAIN 08/27/16   Belva Crome, MD  pantoprazole (PROTONIX) 40 MG tablet Take 1 tablet (40 mg total) by mouth daily. Patient taking differently: Take 40 mg by mouth every other day.  03/07/17   Belva Crome, MD  phenazopyridine (PYRIDIUM) 200 MG tablet Take 1 tablet (200 mg total) by mouth 3 (three) times daily as needed for pain. 04/25/17   Kathie Rhodes, MD  prochlorperazine (COMPAZINE) 10 MG tablet Take 1 tablet (10 mg total) by mouth every 6 (six) hours as needed for nausea or vomiting. 06/29/17   Wyatt Portela, MD  torsemide (DEMADEX) 20 MG tablet TAKE 2 TABLETS BY MOUTH EVERY DAY 03/21/17   Belva Crome, MD  traMADol (ULTRAM) 50 MG tablet TK 1 T PO Q 6 H FOR 5 DAYS PRF PAIN 06/22/17   [provider]    Physical Exam: Vitals:   07/01/17 1428 07/01/17 1430 07/01/17 1500 07/01/17 1621  BP: (!) 161/59 (!) 165/62 (!) 159/62 (!) 183/61  Pulse: 81 83 81 74  Resp: 13 14 18 18   Temp:    97.9 F (36.6 C)  TempSrc:    Oral  SpO2: 99% 98% 96% 98%    Weight:    77.3 kg (170 lb 6.7 oz)  Height:    5\' 6"  (1.676 m)    Constitutional: NAD, calm, comfortable Vitals:   07/01/17 1428 07/01/17 1430 07/01/17 1500 07/01/17 1621  BP: (!) 161/59 (!) 165/62 (!) 159/62 (!) 183/61  Pulse: 81 83 81 74  Resp: 13 14 18 18   Temp:    97.9 F (36.6 C)  TempSrc:    Oral  SpO2: 99% 98% 96% 98%  Weight:    77.3 kg (170 lb 6.7 oz)  Height:    5\' 6"  (1.676 m)   Eyes: PERRL, lids and conjunctivae normal ENMT: Mucous membranes are moist. Posterior pharynx clear of any exudate or lesions.Normal dentition.  Neck: normal, supple, no masses, no thyromegaly Respiratory: clear to auscultation bilaterally, no wheezing, no crackles. Normal respiratory effort. No accessory muscle use.  Cardiovascular: Regular rate and rhythm, no murmurs / rubs / gallops. No extremity edema. 2+ pedal pulses. No carotid bruits.  Abdomen: Distended, mild supra-pubic tenderness. , no masses palpated. No hepatosplenomegaly. Bowel sounds positive.  Urinary; foley catheter in place with bloody  Musculoskeletal: no clubbing / cyanosis. No joint deformity upper and lower extremities. Good ROM, no contractures. Normal muscle tone.  Skin: no rashes, lesions, ulcers. No induration Neurologic: CN 2-12 grossly intact. Sensation intact, DTR normal. Strength 5/5 in all 4.  Psychiatric: Normal judgment and insight. Alert and oriented x 3. Normal mood.     Labs on Admission:  I have personally reviewed following labs and imaging studies  CBC:  Recent Labs Lab 07/01/17 1130  WBC 15.1*  HGB 10.7*  HCT 31.5*  MCV 81.6  PLT 253   Basic Metabolic Panel:  Recent Labs Lab 07/01/17 1130 07/01/17 1703  NA 131* 136  K 6.0* 4.6  CL 98* 99*  CO2 25 25  GLUCOSE 244* 205*  BUN 42* 38*  CREATININE 2.42* 2.07*  CALCIUM 8.9 8.7*   GFR: Estimated Creatinine Clearance: 30.2 mL/min (A) (by C-G formula based on SCr of 2.07 mg/dL (H)). Liver Function Tests: No results for input(s): AST, ALT,  ALKPHOS, BILITOT, PROT, ALBUMIN in the last 168 hours. No results for input(s): LIPASE, AMYLASE in the last 168 hours. No results for input(s): AMMONIA in the last 168 hours. Coagulation Profile: No results for input(s): INR, PROTIME in the last 168 hours. Cardiac Enzymes:  Recent Labs Lab 07/01/17 1441 07/01/17 1703  TROPONINI 0.07* 0.13*   BNP (last 3 results) No results for input(s): PROBNP in the last 8760 hours. HbA1C: No results for input(s): HGBA1C in the last 72 hours. CBG:  Recent Labs Lab 07/01/17 1708  GLUCAP 196*   Lipid Profile: No results for input(s): CHOL, HDL, LDLCALC, TRIG, CHOLHDL, LDLDIRECT in the last 72 hours. Thyroid Function Tests: No results for input(s): TSH, T4TOTAL, FREET4, T3FREE, THYROIDAB in the last 72 hours. Anemia Panel: No results for input(s): VITAMINB12, FOLATE, FERRITIN, TIBC, IRON, RETICCTPCT in the last 72 hours. Urine analysis:    Component Value Date/Time   COLORURINE RED (A) 07/01/2017 1135   APPEARANCEUR TURBID (A) 07/01/2017 1135   LABSPEC  07/01/2017 1135    TEST NOT REPORTED DUE TO COLOR INTERFERENCE OF URINE PIGMENT   PHURINE  07/01/2017 1135    TEST NOT REPORTED DUE TO COLOR INTERFERENCE OF URINE PIGMENT   GLUCOSEU (A) 07/01/2017 1135    TEST NOT REPORTED DUE TO COLOR INTERFERENCE OF URINE PIGMENT   HGBUR (A) 07/01/2017 1135    TEST NOT REPORTED DUE TO COLOR INTERFERENCE OF URINE PIGMENT   BILIRUBINUR (A) 07/01/2017 1135    TEST NOT REPORTED DUE TO COLOR INTERFERENCE OF URINE PIGMENT   KETONESUR (A) 07/01/2017 1135    TEST NOT REPORTED DUE TO COLOR INTERFERENCE OF URINE PIGMENT   PROTEINUR (A) 07/01/2017 1135    TEST NOT REPORTED DUE TO COLOR INTERFERENCE OF URINE PIGMENT   UROBILINOGEN 1.0 03/16/2015 2130   NITRITE (A) 07/01/2017 1135    TEST NOT REPORTED DUE TO COLOR INTERFERENCE OF URINE PIGMENT   LEUKOCYTESUR (A) 07/01/2017 1135    TEST NOT REPORTED DUE TO COLOR INTERFERENCE OF URINE PIGMENT    Radiological  Exams on Admission: Dg Chest Port 1 View  Result Date: 07/01/2017 CLINICAL DATA:  History of bladder cancer. EXAM: PORTABLE CHEST 1 VIEW COMPARISON:  PET-CT 06/07/2017. FINDINGS: Prior CABG. Cardiomegaly with pulmonary vascular prominence. Mild interstitial prominence. Mild component congestive heart failure cannot be excluded. No pleural effusion or pneumothorax. IMPRESSION: Prior CABG. A mild component of congestive heart failure cannot be excluded. Electronically Signed   By: Marcello Moores  Register   On: 07/01/2017 15:02    EKG: Independently reviewed. ST changes in the anterior leads.   Assessment/Plan Active Problems:   CKD (chronic kidney disease) stage 3, GFR 30-59 ml/min   Chronic diastolic heart failure (HCC)   HTN (hypertension)   Anemia   Renal failure   AKI (acute kidney injury) (HCC)   Hyperkalemia   Troponin level elevated  1-Hematuria, obstructive uropathy;  IV fluids.  Patient has foley catheter.  Renal US.  Hold anticoagulation for now.   2-Acute Renal Failure; obstructive uropathy;  IV fluids, renal US. Urology consulted.  Cr baseline 1.8--1.9.   3-Hyperkalemia; received insulin, kayexalate, bicarb.  K has decreased to 4.6.  Discontinue further doses of kayexalate and calcium gluconate.   4-UTI; leukocytosis, hematuria. UA with too numerous to count.  IV ceftriaxone.   5-Chest pain, EKG changes,  At this time he is chest pain free.  Nitroglycerin PRN, statin. Cycle troponin.  Cardiology consulted.  He received a dose of aspirin.   6-DM; continue with lantus and SSI.   7-HTN; Continue with hydralazine Norvasc, Metoprolol.     DVT prophylaxis: SCD.  Code Status: Full Code.  Family Communication; care discussed with patient.   Disposition Plan: admit inpatient  Consults called: Urology, Cardiology  Admission status: inpatient , telemetry   Niel Hummer A MD [facility] Pager [phone]  If 7PM-7AM, please contact  night-coverage [email or web address] Password [facility]  07/01/2017, 6:02 PM

## 2017-07-01 NOTE — Consult Note (Signed)
Urology Consult Note   Requesting Attending Physician:  Elmarie Shiley, MD Service Providing Consult: Urology  Consulting Attending: Dr. Tyrell Antonio   Reason for Consult:  Gross hematuria  HPI: [name] is seen in consultation for reasons noted above at the request of Regalado, Belkys A, MD for evaluation of gross hematuria.  This is a 76 y.o. male with hx CAD and A-fib on ASA/Plavix, as well as HgT1 bladder cancer (to begin NAC prior to cystectomy in the coming weeks) and voiding dysfunction on q3h self-cath regimen who presents today with ~24 hours of progressive gross hematuria. Began yesterday after cathing, unsure if catheter trauma related. [place] progressed and he was unable to cath. Presented to ED, where a 16Fr Foley was placed, and clots flushed from his bladder.   In ED, had chest pain and mild troponinemia, and so was admitted to the hospitalist service for evaluation.   Otherwise doing well recently. No infectious symptoms.    Past Medical History: Past Medical History:  Diagnosis Date  . Anemia    a. + FOBT 08/2014.  Marland Kitchen Anginal pain ([place])    comes and goes; uses nitro to relieve   . Arthritis   . Atherosclerosis of native arteries of the extremities with intermittent claudication    a. L external iliac stent 2001, bilateral SFA occlusions documented 2001.  . Atrial fibrillation ([place])   . Bladder cancer ([place])   . CAD (coronary artery disease), native coronary artery 09/24/2013   a. CABG x 6 1999. b. Interim PCIs: Angioplasty SVG-PDA 2006.Taxus DES to native LCx in 2007.DES to SVG-PDA 2007, restented 2009.NSTEMI with PCI to SVG- PDA complicated by no-reflow/inferior infarction in 2012. c. Redo CABG 2015 with SVG-PD, SVG-D1, SVG-OM1.  . Carotid artery occlusion    a. Duplex 12/2013: mild plaque bilat, 40-59% BICA. F/u due 12/2014.  Marland Kitchen Chronic combined systolic and diastolic CHF (congestive heart failure) ([place])    a. Prior EF 45-50% in 08/2014. b. Improved EF >55% in  09/2014 at [facility].  . Chronic lower back pain   . CKD (chronic kidney disease) stage 3, GFR 30-59 ml/min   . COPD (chronic obstructive pulmonary disease) ([place])    pt states he doesn't have  . Daily headache    comes and goes pt denies daily currently   . Dysrhythmia   . Esophageal stricture    a. History of dilitation - Long-standing history of esophageal reflux   . Gait instability   . GERD (gastroesophageal reflux disease)   . Gout   . History of bronchitis   . History of gout   . History of hiatal hernia   . Hx of CABG 09/24/2013   a. 1999: LIMA to LAD, sequential SVG to diagonal 2 and diagonal 3, sequential SVG to PDA, acute marginal branch, and PL branch. b. Repeat CABG 2015 with SVG to D1, SVG to PDA, and SVG to OM.   Marland Kitchen Hyperlipidemia   . Hypertension   . Hypotonic bladder    Requiring in and out catheterization performed by the patient at home - occasional blood tinged urine (self-cath's daily for the last 2 years) and hematuria is chronic for him, followed by urology.  . MI (myocardial infarction) ([place]) 1999-02/2014   total of 7 MI per patient  . OSA (obstructive sleep apnea)    "don't wear mask"   . Polymyalgia rheumatica ([place])   . Self-catheterizes urinary bladder   . Shortness of breath dyspnea   . Skin cancer   .  SVT (supraventricular tachycardia) ([place])   . Type II diabetes mellitus ([place])     Past Surgical History:  Past Surgical History:  Procedure Laterality Date  . ABDOMINAL AORTAGRAM N/A 11/27/2014   Procedure: ABDOMINAL Maxcine Ham;  Surgeon: Wellington Hampshire, MD;  Location: [facility] CATH LAB;  Service: Cardiovascular;  Laterality: N/A;  . ANKLE FRACTURE SURGERY Right ~ 1977   "10 plates and 7 screws placed in it"  . ANTERIOR CERVICAL DECOMP/DISCECTOMY FUSION    . BACK SURGERY     neck surgery x 2  . CARDIAC CATHETERIZATION    . CATARACT EXTRACTION W/ INTRAOCULAR LENS  IMPLANT, BILATERAL Bilateral 2000's  . COLONOSCOPY    . CORONARY ARTERY BYPASS GRAFT  1999  .  CORONARY ARTERY BYPASS GRAFT N/A 03/01/2014   Procedure: REDO CORONARY ARTERY BYPASS GRAFTING TIMES THREE, USING RIGHT GREATER SAPHENOUS VEIN VIA ENDOVEIN HARVEST.;  Surgeon: Gaye Pollack, MD;  Location: [facility] OR;  Service: Open Heart Surgery;  Laterality: N/A;  . CORONARY STENT PLACEMENT     "I've had 8 put in; I presently have none" (08/26/2014)  . CYSTOSCOPY W/ RETROGRADES Bilateral 03/03/2015   Procedure: CYSTOSCOPY WITH LEFT RETROGRADE PYELOGRAM;  Surgeon: Kathie Rhodes, MD;  Location: [facility] ORS;  Service: Urology;  Laterality: Bilateral;  . CYSTOSCOPY WITH BIOPSY N/A 04/25/2017   Procedure: CYSTOSCOPY WITH BLADDER BIOPSY;  Surgeon: Kathie Rhodes, MD;  Location: [facility] ORS;  Service: Urology;  Laterality: N/A;  . ENDARTERECTOMY Left 01/30/2015   Procedure: ENDARTERECTOMY CAROTID WITH PRIMARY CLOSURE OF ARTERY;  Surgeon: Angelia Mould, MD;  Location: [facility] OR;  Service: Vascular;  Laterality: Left;  . ESOPHAGOGASTRODUODENOSCOPY (EGD) WITH ESOPHAGEAL DILATION  X 1  . EYE SURGERY     cataract surgery bilat   . FRACTURE SURGERY Right    ankle  . INTRAOPERATIVE TRANSESOPHAGEAL ECHOCARDIOGRAM N/A 03/01/2014   Procedure: INTRAOPERATIVE TRANSESOPHAGEAL ECHOCARDIOGRAM;  Surgeon: Gaye Pollack, MD;  Location: [facility] OR;  Service: Open Heart Surgery;  Laterality: N/A;  . IR NEPHROSTOMY PLACEMENT RIGHT  05/11/2017  . IR URETERAL STENT PLACEMENT EXISTING ACCESS RIGHT  05/25/2017  . LEFT HEART CATHETERIZATION WITH CORONARY/GRAFT ANGIOGRAM  02/26/2014   Procedure: LEFT HEART CATHETERIZATION WITH Beatrix Fetters;  Surgeon: Sinclair Grooms, MD;  Location: [facility] CATH LAB;  Service: Cardiovascular;;  . LEFT HEART CATHETERIZATION WITH CORONARY/GRAFT ANGIOGRAM N/A 07/23/2014   Procedure: LEFT HEART CATHETERIZATION WITH Beatrix Fetters;  Surgeon: Sinclair Grooms, MD;  Location: [facility] CATH LAB;  Service: Cardiovascular;  Laterality: N/A;  . LEFT HEART CATHETERIZATION WITH CORONARY/GRAFT ANGIOGRAM N/A 08/30/2014    Procedure: LEFT HEART CATHETERIZATION WITH Beatrix Fetters;  Surgeon: Leonie Man, MD;  Location[facility] CATH LAB;  Service: Cardiovascular;  Laterality: N/A;  . MOHS SURGERY  X 2   "nose; nose"  . MULTIPLE TOOTH EXTRACTIONS    . SKIN CANCER EXCISION     "all over my head; ears; back  . TONSILLECTOMY    . TRANSURETHRAL RESECTION OF BLADDER TUMOR WITH GYRUS (TURBT-GYRUS) N/A 03/03/2015   Procedure: TRANSURETHRAL RESECTION OF BLADDER TUMOR;  Surgeon: Kathie Rhodes, MD;  Location[facility];  Service: Urology;  Laterality: N/A;  . TRANSURETHRAL RESECTION OF BLADDER TUMOR WITH GYRUS (TURBT-GYRUS) N/A 04/28/2015   Procedure: TRANSURETHRAL RESECTION OF BLADDER TUMOR ;  Surgeon: Kathie Rhodes, MD;  Location: [facility];  Service: Urology;  Laterality: N/A;  . TRANSURETHRAL RESECTION OF BLADDER TUMOR WITH GYRUS (TURBT-GYRUS) N/A 11/24/2015   Procedure: TRANSURETHRAL RESECTION OF BLADDER TUMOR WITH GYRUS (TURBT-GYRUS);  Surgeon: Kathie Rhodes, MD;  [facility]: [facility];  Service: Urology;  Laterality: N/A;  . TRANSURETHRAL RESECTION OF PROSTATE N/A 04/25/2017   Procedure: TRANSURETHRAL RESECTION OF THE PROSTATE (TURP);  Surgeon: Kathie Rhodes, MD;  Location: [facility];  Service: Urology;  Laterality: N/A;    Medication: Current Facility-Administered Medications  Medication Dose Route Frequency Provider Last Rate Last Dose  . 0.9 %  sodium chloride infusion   Intravenous Continuous Regalado, Belkys A, MD 75 mL/hr at 07/01/17 1645    . acetaminophen (TYLENOL) tablet 650 mg  650 mg Oral Q6H PRN Regalado, Belkys A, MD       Or  . acetaminophen (TYLENOL) suppository 650 mg  650 mg Rectal Q6H PRN Regalado, Belkys A, MD      . Derrill Memo ON 07/02/2017] amLODipine (NORVASC) tablet 10 mg  10 mg Oral Daily Regalado, Belkys A, MD      . atorvastatin (LIPITOR) tablet 20 mg  20 mg Oral q1800 Regalado, Belkys A, MD      . Derrill Memo ON 07/02/2017] cefTRIAXone (ROCEPHIN) 1 g in dextrose 5 % 50 mL IVPB  1 g Intravenous Q24H Regalado,  Belkys A, MD      . cholecalciferol (VITAMIN D) tablet 400 Units  400 Units Oral QHS Regalado, Belkys A, MD      . docusate sodium (COLACE) capsule 100 mg  100 mg Oral BID Regalado, Belkys A, MD      . Derrill Memo ON 07/02/2017] ferrous gluconate (FERGON) tablet 324 mg  324 mg Oral Q breakfast Regalado, Belkys A, MD      . hydrALAZINE (APRESOLINE) tablet 50 mg  50 mg Oral BID Regalado, Belkys A, MD      . insulin aspart (novoLOG) injection 0-9 Units  0-9 Units Subcutaneous TID WC Regalado, Belkys A, MD      . Derrill Memo ON 07/02/2017] insulin glargine (LANTUS) injection 10 Units  10 Units Subcutaneous Daily Regalado, Belkys A, MD      . isosorbide mononitrate (IMDUR) 24 hr tablet 120 mg  120 mg Oral Daily Regalado, Belkys A, MD      . magnesium oxide (MAG-OX) tablet 400 mg  400 mg Oral QPM Regalado, Belkys A, MD      . metoprolol tartrate (LOPRESSOR) tablet 50 mg  50 mg Oral BID Regalado, Belkys A, MD      . nitroGLYCERIN (NITROSTAT) SL tablet 0.4 mg  0.4 mg Sublingual Q5 min PRN Lajean Saver, MD   0.4 mg at 07/01/17 1447  . ondansetron (ZOFRAN) tablet 4 mg  4 mg Oral Q6H PRN Regalado, Belkys A, MD       Or  . ondansetron (ZOFRAN) injection 4 mg  4 mg Intravenous Q6H PRN Regalado, Belkys A, MD      . sodium chloride 0.9 % bolus 500 mL  500 mL Intravenous Once Regalado, Belkys A, MD      . traMADol (ULTRAM) tablet 50 mg  50 mg Oral Q6H PRN Regalado, Belkys A, MD        Allergies: Allergies  Allergen Reactions  . Other Itching and Other (See Comments)    The iv dye used in Fundus Flourescein Angiography procedure  Kidney issues  . Penicillins Hives    Augmentin Has patient had a PCN reaction causing immediate rash, facial/tongue/throat swelling, SOB or lightheadedness with hypotension: No Has patient had a PCN reaction causing severe rash involving mucus membranes or skin necrosis: No Has patient had a PCN reaction that required hospitalization: Yes Has patient had  a PCN reaction occurring within  the last 10 years: Yes If all of the above answers are "NO", then may proceed with Cephalosporin use.     Social History: Social History  Substance Use Topics  . Smoking status: Former Smoker    Packs/day: 2.00    Years: 50.00    Types: Cigarettes    Quit date: 09/18/2015  . Smokeless tobacco: Never Used  . 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 CAD Mother   . Heart disease Mother   . Hypertension Mother   . Cirrhosis Brother   . Heart disease Father   . Hyperlipidemia Father   . Hypertension Father   . Heart attack Father     Review of Systems 10 systems were reviewed and are negative except as noted specifically in the HPI.  Objective   Vital signs in last 24 hours: BP (!) 183/61 (BP Location: Right Arm)   Pulse 74   Temp 97.9 F (36.6 C) (Oral)   Resp 18   Ht '5\' 6"'$  (1.676 m)   Wt 77.3 kg (170 lb 6.7 oz)   SpO2 98%   BMI 27.51 kg/m   Physical Exam General: NAD, A&O, resting, appropriate HEENT: /AT, EOMI, MMM Pulmonary: Normal work of breathing Cardiovascular: HDS, adequate peripheral perfusion Abdomen: Soft, NTTP, nondistended. GU: 16Fr Foley draining clear pink urine without clot, no CVA tenderness Extremities: warm and well perfused Neuro: Appropriate, no focal neurological deficits  Most Recent Labs: Lab Results  Component Value Date   WBC 15.1 (H) 07/01/2017   HGB 10.7 (L) 07/01/2017   HCT 31.5 (L) 07/01/2017   PLT 268 07/01/2017    Lab Results  Component Value Date   NA 136 07/01/2017   K 4.6 07/01/2017   CL 99 (L) 07/01/2017   CO2 25 07/01/2017   BUN 38 (H) 07/01/2017   CREATININE 2.07 (H) 07/01/2017   CALCIUM 8.7 (L) 07/01/2017   MG 2.0 09/02/2014    Lab Results  Component Value Date   INR 0.96 05/25/2017   APTT 27 05/25/2017     IMAGING: Dg Chest Port 1 View  Result Date: 07/01/2017 CLINICAL DATA:  History  of bladder cancer. EXAM: PORTABLE CHEST 1 VIEW COMPARISON:  PET-CT 06/07/2017. FINDINGS: Prior CABG. Cardiomegaly with pulmonary vascular prominence. Mild interstitial prominence. Mild component congestive heart failure cannot be excluded. No pleural effusion or pneumothorax. IMPRESSION: Prior CABG. A mild component of congestive heart failure cannot be excluded. Electronically Signed   By: Marcello Moores  Register   On: 07/01/2017 15:02    ------  Assessment:  76 y.o. male with HgT1 bladder cancer as well as voiding dysfunction on CIC who is admitted for clot retention and work-up of chest pain.   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 Karsten Ro. Marland Kitchen Lesion centered Rt trigone at area of diverticulum close to Rt UO. Prostate stromal invasion also present. PET-CT 05/2017 clinically localized. Met with medical oncology Dr. Alen Blew who rec's up front surgeyr as his GFR precludes neo-adjuvant chemo.   Malignant Ureteral Obstruction / Stage 3 Chronic Renal Insufficiency - Cr 1.8-2 x years. Known diabetic, HTN, PAD. New mild Rt hydro to UVJ tumor 2018 now managed with antegrade JJ stent.    Atonic Bladder with Large  Diverticulum - manages with CIC at least QID at baseline for hypotonic bladder and large diverticulum.  Currently, catheter in place draining clear pink urine without clot. Flushed at bedside, removing several small, old clots. No active bleeding appreciated. No need for CBI. Likely catheter trauma v bleeding from tumor in the setting of oral anticoagulation, held currently.    Recommendations: - Continue Foley catheter to drain for 7 days. At discharge, please instruct him how to do this at home. Can continue regular cath regimen therafter.  - Flush Foley with 200-300cc NS PRN for clots.  - Urology will sign off. Please page with any questions or concerns.    Thank you for this consult.  Please contact the urology consult pager with any further questions/concerns.  Patient was seen, examined,treatment plan was discussed with the resident.  I have directly reviewed the clinical findings, lab, imaging studies and management of this patient in detail. I have made the necessary changes and/or additions to the above noted documentation, and agree with the documentation, as recorded by the resident.

## 2017-07-02 ENCOUNTER — Inpatient Hospital Stay (HOSPITAL_COMMUNITY): Payer: Medicare Other

## 2017-07-02 LAB — BASIC METABOLIC PANEL
Anion gap: 5 (ref 5–15)
BUN: 36 mg/dL — ABNORMAL HIGH (ref 6–20)
CO2: 28 mmol/L (ref 22–32)
Calcium: 8.4 mg/dL — ABNORMAL LOW (ref 8.9–10.3)
Chloride: 106 mmol/L (ref 101–111)
Creatinine, Ser: 1.79 mg/dL — ABNORMAL HIGH (ref 0.61–1.24)
GFR, EST AFRICAN AMERICAN: 41 mL/min — AB (ref >60)
GFR, EST NON AFRICAN AMERICAN: 35 mL/min — AB (ref >60)
Glucose, Bld: 83 mg/dL (ref 65–99)
POTASSIUM: 4.8 mmol/L (ref 3.5–5.1)
SODIUM: 139 mmol/L (ref 135–145)

## 2017-07-02 LAB — CBC
HEMATOCRIT: 27.7 % — AB (ref 39.0–52.0)
HEMATOCRIT: 27.6 % — AB (ref 39.0–52.0)
HEMOGLOBIN: 9.4 g/dL — AB (ref 13.0–17.0)
Hemoglobin: 9.2 g/dL — ABNORMAL LOW (ref 13.0–17.0)
MCH: 27.3 pg (ref 26.0–34.0)
MCH: 27.6 pg (ref 26.0–34.0)
MCHC: 33.9 g/dL (ref 30.0–36.0)
MCHC: 33.3 g/dL (ref 30.0–36.0)
MCV: 80.5 fL (ref 78.0–100.0)
MCV: 82.9 fL (ref 78.0–100.0)
PLATELETS: 244 10*3/uL (ref 150–400)
Platelets: 219 10*3/uL (ref 150–400)
RBC: 3.44 MIL/uL — ABNORMAL LOW (ref 4.22–5.81)
RBC: 3.33 MIL/uL — ABNORMAL LOW (ref 4.22–5.81)
RDW: 14.5 % (ref 11.5–15.5)
RDW: 14.8 % (ref 11.5–15.5)
WBC: 10.9 10*3/uL — AB (ref 4.0–10.5)
WBC: 13 10*3/uL — AB (ref 4.0–10.5)

## 2017-07-02 LAB — URINE CULTURE

## 2017-07-02 LAB — TROPONIN I
TROPONIN I: 0.2 ng/mL — AB (ref <0.03)
Troponin I: 0.18 ng/mL (ref <0.03)
Troponin I: 0.13 ng/mL (ref <0.03)

## 2017-07-02 LAB — GLUCOSE, CAPILLARY
GLUCOSE-CAPILLARY: 80 mg/dL (ref 65–99)
GLUCOSE-CAPILLARY: 76 mg/dL (ref 65–99)
GLUCOSE-CAPILLARY: 221 mg/dL — AB (ref 65–99)
Glucose-Capillary: 151 mg/dL — ABNORMAL HIGH (ref 65–99)

## 2017-07-02 MED ORDER — NITROGLYCERIN 0.4 MG SL SUBL
0.4000 mg | SUBLINGUAL_TABLET | SUBLINGUAL | Status: DC | PRN
  Administered 2017-07-03 (×3): 0.4 mg via SUBLINGUAL
  Filled 2017-07-02 (×2): qty 1

## 2017-07-02 MED ORDER — NITROGLYCERIN IN D5W 200-5 MCG/ML-% IV SOLN
INTRAVENOUS | Status: AC
  Filled 2017-07-02: qty 250

## 2017-07-02 MED ORDER — ASPIRIN EC 81 MG PO TBEC
81.0000 mg | DELAYED_RELEASE_TABLET | Freq: Every day | ORAL | Status: DC
  Administered 2017-07-02 – 2017-07-08 (×7): 81 mg via ORAL
  Filled 2017-07-02 (×7): qty 1

## 2017-07-02 MED ORDER — MORPHINE SULFATE (PF) 4 MG/ML IV SOLN: 0.5000 mg | INTRAVENOUS | Status: DC | PRN

## 2017-07-02 MED ORDER — INSULIN GLARGINE 100 UNIT/ML ~~LOC~~ SOLN
5.0000 [IU] | Freq: Every day | SUBCUTANEOUS | Status: DC
  Administered 2017-07-02 – 2017-07-03 (×2): 5 [IU] via SUBCUTANEOUS
  Filled 2017-07-02 (×2): qty 0.05

## 2017-07-02 MED ORDER — NITROGLYCERIN 0.4 MG SL SUBL
SUBLINGUAL_TABLET | SUBLINGUAL | Status: AC
  Filled 2017-07-02: qty 1

## 2017-07-02 MED ORDER — NITROGLYCERIN IN D5W 200-5 MCG/ML-% IV SOLN
0.0000 ug/min | INTRAVENOUS | Status: DC
Start: 2017-07-02 — End: 2017-07-03
  Administered 2017-07-03: 35 ug/min via INTRAVENOUS

## 2017-07-02 MED ORDER — CLOPIDOGREL BISULFATE 75 MG PO TABS
75.0000 mg | ORAL_TABLET | Freq: Every day | ORAL | Status: DC
  Administered 2017-07-02 – 2017-07-08 (×7): 75 mg via ORAL
  Filled 2017-07-02 (×7): qty 1

## 2017-07-02 NOTE — Progress Notes (Signed)
Pt has had uneventful day. OOB to chair for short periods of time. Irrigated foley times x2. No clots noted. Flushes well no resistance met. Irrigate return both times. Cont with plan of care

## 2017-07-02 NOTE — Significant Event (Signed)
Rapid Response Event Note  Overview: Time Called: 9379 Arrival Time: 0240 Event Type: Cardiac  Initial Focused Assessment:   Patient diaphoretic lying in bed, short of breath. 2L [place] placed on patient. BP elevated at 183/69 at 1819, patient's RN in process of a 12 lead EKG. HR 80s-90s. Patient states this would be his 10th heart attack. He describes the pain as a 10/10 pressure on his chest radiating down his left arm. Patients primary attending MD paged and cardiology consulted. At 1820 the first dose of Nitro was given pain decreased to 8/10. 2nd dose of nitro given at 1825 easing pain to a 3/10. BP 158/60 post nitro at 1830. BP began to increase at 1735 to 166/65 and patient complained of pain starting back up. 3rd Nitro given at [place]. BP at 1843 165/61 post 3rd nitro with a pain score of 3/10. Patients RN receiving lab and transfer orders from cardiology MD. [phone] patients wife was called by primary RN to update on patients status. Patients pain increasing from a 1/10 to a 7/10 and 4th dose of Nitro given at [place] with a post BP 171/73. Cardiology paged and orders received for a Nitro drip. Cardiology ordered 0.5 morphine IV given at 1900. Post morphine chest pain was 4/10. Nitro drip started at 1819 at 45mcg with a BP at 1925 of 176/67.  Rapid Response RN continuing to monitor awaiting transfer via. [facility].    Plan of Care (if not transferred):   Transferring to [facility] at [facility] for active chest pain. Bed orders received and [facility] called for transport. Rapid Response RN to stay with patient and titrate Nitro drip as prescribed until [facility] arrives.         Kiela Shisler A Corrissa Martello

## 2017-07-02 NOTE — Progress Notes (Signed)
Progress Note  Patient Name: [name] Date of Encounter: 07/02/2017    Subjective   No recurrent chest pain  Inpatient Medications    Scheduled Meds: . amLODipine  10 mg Oral Daily  . aspirin EC  81 mg Oral Daily  . atorvastatin  20 mg Oral q1800  . cholecalciferol  400 Units Oral QHS  . clopidogrel  75 mg Oral Daily  . docusate sodium  100 mg Oral BID  . ferrous gluconate  324 mg Oral Q breakfast  . hydrALAZINE  50 mg Oral BID  . insulin aspart  0-9 Units Subcutaneous TID WC  . insulin glargine  10 Units Subcutaneous Daily  . isosorbide mononitrate  120 mg Oral Daily  . magnesium oxide  400 mg Oral QPM  . metoprolol tartrate  50 mg Oral BID   Continuous Infusions: . sodium chloride 75 mL/hr at 07/01/17 1645  . cefTRIAXone (ROCEPHIN)  IV     PRN Meds: acetaminophen **OR** acetaminophen, nitroGLYCERIN, ondansetron **OR** ondansetron (ZOFRAN) IV, traMADol   Vital Signs    Vitals:   07/01/17 1500 07/01/17 1621 07/01/17 2019 07/02/17 0513  BP: (!) 159/62 (!) 183/61 (!) 146/56 (!) 147/48  Pulse: 81 74 67 64  Resp: 18 18 18 20   Temp:  97.9 F (36.6 C) 98.1 F (36.7 C) 98.4 F (36.9 C)  TempSrc:  Oral Oral Oral  SpO2: 96% 98% 96% 98%  Weight:  170 lb 6.7 oz (77.3 kg)  164 lb 0.4 oz (74.4 kg)  Height:  5\' 6"  (1.676 m)      Intake/Output Summary (Last 24 hours) at 07/02/17 0904 Last data filed at 07/02/17 0515  Gross per 24 hour  Intake            937.5 ml  Output             1550 ml  Net           -612.5 ml   Filed Weights   07/01/17 1025 07/01/17 1621 07/02/17 0513  Weight: 162 lb (73.5 kg) 170 lb 6.7 oz (77.3 kg) 164 lb 0.4 oz (74.4 kg)    Telemetry    SR- Personally Reviewed  ECG     Physical Exam   GEN: No acute distress.   Neck: No JVD Cardiac: RRR, no murmurs, rubs, or gallops.  Respiratory: Clear to auscultation bilaterally. GI: Soft, nontender, non-distended  MS: No edema; No deformity. Neuro:  Nonfocal  Psych: Normal affect     Labs    Chemistry Recent Labs Lab 07/01/17 1130 07/01/17 1703 07/02/17 0450  NA 131* 136 139  K 6.0* 4.6 4.8  CL 98* 99* 106  CO2 25 25 28   GLUCOSE 244* 205* 83  BUN 42* 38* 36*  CREATININE 2.42* 2.07* 1.79*  CALCIUM 8.9 8.7* 8.4*  GFRNONAA 25* 30* 35*  GFRAA 28* 34* 41*  ANIONGAP 8 12 5      Hematology Recent Labs Lab 07/01/17 1130 07/02/17 0450  WBC 15.1* 10.9*  RBC 3.86* 3.44*  HGB 10.7* 9.4*  HCT 31.5* 27.7*  MCV 81.6 80.5  MCH 27.7 27.3  MCHC 34.0 33.9  RDW 14.5 14.5  PLT 268 219    Cardiac Enzymes Recent Labs Lab 07/01/17 1441 07/01/17 1703 07/01/17 2312 07/02/17 0450  TROPONINI 0.07* 0.13* 0.18* 0.20*   No results for input(s): TROPIPOC in the last 168 hours.   BNPNo results for input(s): BNP, PROBNP in the last 168 hours.   DDimer No results for  input(s): DDIMER in the last 168 hours.   Radiology    Dg Chest Port 1 View  Result Date: 07/01/2017 CLINICAL DATA:  History of bladder cancer. EXAM: PORTABLE CHEST 1 VIEW COMPARISON:  PET-CT 06/07/2017. FINDINGS: Prior CABG. Cardiomegaly with pulmonary vascular prominence. Mild interstitial prominence. Mild component congestive heart failure cannot be excluded. No pleural effusion or pneumothorax. IMPRESSION: Prior CABG. A mild component of congestive heart failure cannot be excluded. Electronically Signed   By: Marcello Moores  Register   On: 07/01/2017 15:02    Cardiac Studies    Patient Profile     [name] is a 76 y.o. male with a hx of former tobacco abuse, COPD, sleep apnea, CAD s/p CABG and redo CABG, chronic combined systolic and diastolic CHF, stage 3 CKD, HTN, HLD, DM, carotid artery disease, atrial fibrillation on chronic anti-coagulation therapy, PAD who is being seen today for the evaluation of chest pain at the request of Dr. Tyrell Antonio.  Assessment & Plan  1. CAD/chest pain - with 6V CABG in 1999 and repeat 3V CABG in 2015 - Last cardiac cath in November 2015 with severe left main/LAD  stenosis but patent LIMA to LAD. The vein graft to the Diagonal Treylin Burtch was patent. The vein graft to the second and third diagonal branches was patent with moderate disease. The native Circumflex has severe proximal disease. The small OM Arlina Sabina filled from the patent vein graft. The native RCA was occluded proximally. - episode of chest pain that resolved with NG x 2.  - trop 0.07-->0.13-->0.18-->0.20 - EKG SR, RBBB lateral ST depressions fairly chronic  - in setting of CKD 3-4 and ongoing hematuria, combined with absence of ongoing chest pain and fairly mild troponin elevation continuing conservative medical therapy alone at this time. Hgb down 10.7 to 9.4.  Medical therapy with ASA, atorva, plavix, imdur, lopressor. No ACE/ARB due to poor renal function. If Hgb drops below 9 would consider transfusion, if further drop may also need to consider holding antiplatelet agents.  - repeat troponin tomorrow AM to see if peaked      For questions or updates, please contact [place] Please consult [email or web address] for contact info under Cardiology/STEMI.      Merrily Pew, MD  07/02/2017, 9:04 AM

## 2017-07-02 NOTE — Progress Notes (Addendum)
PROGRESS NOTE    [name]  MPN:[id number] DOB: 06-13-1941 DOA: 07/01/2017 PCP: Shirline Frees, MD    Brief Narrative:  [name] is a 76 y.o. 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 Patient relates that he catheterized himself for last 5 years. The day prior to admission he started to have difficulty with catheter. The catheter got clotted with blood. He report gross hematuria and clots. He denies fever, chills. He has an episode of chest pain after he received a medication in the ED> '' Bicarb amp" . He received nitroglycerin. He is currently chest pain free. He had some ST changes in the EKG> case was reviewed with cardiology on call, who will see patient in consultation at [facility] long.   ED Course: in the ED; patient had 18 french foley catheter placed, the bladder was flushed twice.  K was at 6, cr at 2.4, WBC 15, sodium 131, hb 10, glucose 244, UA; Too numerous to count WBC.   Review of Systems: As per HPI otherwise 10 point review of systems negative.  Assessment & Plan:   Active Problems:   CKD (chronic kidney disease) stage 3, GFR 30-59 ml/min   Chronic diastolic heart failure (HCC)   HTN (hypertension)   Anemia   Renal failure   AKI (acute kidney injury) (HCC)   Hyperkalemia   Troponin level elevated  1-Hematuria, obstructive uropathy;  IV fluids.  Patient has foley catheter.  Renal US. Mild left hydronephrosis, similar to recent PET-CT, allowing for differences in technique. Repeat hb in am.  Flushes PRN   2-Acute Renal Failure; obstructive uropathy;  IV fluids, renal US. Urology consulted.  Cr baseline 1.8--1.9.  Improved with foley catheter placement and IV fluids.    3-Hyperkalemia; received insulin, kayexalate, bicarb.  K has decreased to 4.6.  Resolved.   4-UTI; leukocytosis,  hematuria. UA with too numerous to count.  IV ceftriaxone. Urine culture multiples species present.   5-Angina, Chest pain, EKG changes,  At this time he is chest pain free.  Nitroglycerin PRN, statin. Cycle troponin.  Cardiology consulted. Medical management.  Resume aspirin and plavix.   6-DM; continue with lantus and SSI.   7-HTN; Continue with hydralazine Norvasc, Metoprolol.   a fib paroxysmal. Currently sinus.     DVT prophylaxis: scd Code Status: full code.  Family Communication: wife who was at bedside.  Disposition Plan: home in 24 to 48 hours.    Consultants:   Cardiology  Urology    Procedures: none   Antimicrobials:   Ceftriaxone    Subjective: He is feeling better, still with some blood in the urine. No further chest pain    Objective: Vitals:   07/01/17 1500 07/01/17 1621 07/01/17 2019 07/02/17 0513  BP: (!) 159/62 (!) 183/61 (!) 146/56 (!) 147/48  Pulse: 81 74 67 64  Resp: 18 18 18 20   Temp:  97.9 F (36.6 C) 98.1 F (36.7 C) 98.4 F (36.9 C)  TempSrc:  Oral Oral Oral  SpO2: 96% 98% 96% 98%  Weight:  77.3 kg (170 lb 6.7 oz)  74.4 kg (164 lb 0.4 oz)  Height:  5\' 6"  (1.676 m)      Intake/Output Summary (Last 24 hours) at 07/02/17 0086 Last data filed at 07/02/17 0515  Gross per 24 hour  Intake  937.5 ml  Output             1550 ml  Net           -612.5 ml   Filed Weights   07/01/17 1025 07/01/17 1621 07/02/17 0513  Weight: 73.5 kg (162 lb) 77.3 kg (170 lb 6.7 oz) 74.4 kg (164 lb 0.4 oz)    Examination:  General exam: Appears calm and comfortable  Respiratory system: Clear to auscultation. Respiratory effort normal. Cardiovascular system: S1 & S2 heard, RRR. No JVD, murmurs, rubs, gallops or clicks. No pedal edema. Gastrointestinal system: Abdomen is nondistended, soft and nontender. No organomegaly or masses felt. Normal bowel sounds heard. Central nervous system: Alert and oriented. No focal neurological  deficits. Extremities: Symmetric 5 x 5 power. Skin: No rashes, lesions or ulcers Psychiatry: Judgement and insight appear normal. Mood & affect appropriate.     Data Reviewed: I have personally reviewed following labs and imaging studies  CBC:  Recent Labs Lab 07/01/17 1130 07/02/17 0450  WBC 15.1* 10.9*  HGB 10.7* 9.4*  HCT 31.5* 27.7*  MCV 81.6 80.5  PLT 268 263   Basic Metabolic Panel:  Recent Labs Lab 07/01/17 1130 07/01/17 1703 07/02/17 0450  NA 131* 136 139  K 6.0* 4.6 4.8  CL 98* 99* 106  CO2 25 25 28   GLUCOSE 244* 205* 83  BUN 42* 38* 36*  CREATININE 2.42* 2.07* 1.79*  CALCIUM 8.9 8.7* 8.4*   GFR: Estimated Creatinine Clearance: 32.2 mL/min (A) (by C-G formula based on SCr of 1.79 mg/dL (H)). Liver Function Tests: No results for input(s): AST, ALT, ALKPHOS, BILITOT, PROT, ALBUMIN in the last 168 hours. No results for input(s): LIPASE, AMYLASE in the last 168 hours. No results for input(s): AMMONIA in the last 168 hours. Coagulation Profile: No results for input(s): INR, PROTIME in the last 168 hours. Cardiac Enzymes:  Recent Labs Lab 07/01/17 1441 07/01/17 1703 07/01/17 2312 07/02/17 0450  TROPONINI 0.07* 0.13* 0.18* 0.20*   BNP (last 3 results) No results for input(s): PROBNP in the last 8760 hours. HbA1C: No results for input(s): HGBA1C in the last 72 hours. CBG:  Recent Labs Lab 07/01/17 1708 07/01/17 2104 07/02/17 0720  GLUCAP 196* 82 80   Lipid Profile: No results for input(s): CHOL, HDL, LDLCALC, TRIG, CHOLHDL, LDLDIRECT in the last 72 hours. Thyroid Function Tests: No results for input(s): TSH, T4TOTAL, FREET4, T3FREE, THYROIDAB in the last 72 hours. Anemia Panel: No results for input(s): VITAMINB12, FOLATE, FERRITIN, TIBC, IRON, RETICCTPCT in the last 72 hours. Sepsis Labs: No results for input(s): PROCALCITON, LATICACIDVEN in the last 168 hours.  No results found for this or any previous visit (from the past 240 hour(s)).        Radiology Studies: Dg Chest Port 1 View  Result Date: 07/01/2017 CLINICAL DATA:  History of bladder cancer. EXAM: PORTABLE CHEST 1 VIEW COMPARISON:  PET-CT 06/07/2017. FINDINGS: Prior CABG. Cardiomegaly with pulmonary vascular prominence. Mild interstitial prominence. Mild component congestive heart failure cannot be excluded. No pleural effusion or pneumothorax. IMPRESSION: Prior CABG. A mild component of congestive heart failure cannot be excluded. Electronically Signed   By: [place]   On: 07/01/2017 15:02        Scheduled Meds: . amLODipine  10 mg Oral Daily  . aspirin EC  81 mg Oral Daily  . atorvastatin  20 mg Oral q1800  . cholecalciferol  400 Units Oral QHS  . clopidogrel  75 mg Oral Daily  . docusate  sodium  100 mg Oral BID  . ferrous gluconate  324 mg Oral Q breakfast  . hydrALAZINE  50 mg Oral BID  . insulin aspart  0-9 Units Subcutaneous TID WC  . insulin glargine  5 Units Subcutaneous Daily  . isosorbide mononitrate  120 mg Oral Daily  . magnesium oxide  400 mg Oral QPM  . metoprolol tartrate  50 mg Oral BID   Continuous Infusions: . sodium chloride 75 mL/hr at 07/01/17 1645  . cefTRIAXone (ROCEPHIN)  IV       LOS: 1 day    Time spent: 35 minutes.     Elmarie Shiley, MD [facility] Pager [phone]  If 7PM-7AM, please contact night-coverage [email or web address] Password [email or web address] 07/02/2017, 9:21 AM

## 2017-07-02 NOTE — Progress Notes (Signed)
Pt called out at 1820 c/o of chest pain 10/10. Pressure with pain radiating down left arm.EKG obtained nitro x2 given. Orders obtained from Dr. Tyrell Antonio and initated.  Call placed to cardiology Call received from Cardiology. Placed on O2  At 2l  1820 183/69 Hr. 89 nitro give 1825 167/67, hr 87 1830 second nitro given sub ling  1835 pt reports a 5/10 chest  1840  Third Nitro 1845 150/63 post 3rd nitro 1847 Pt report cp 1. Down to a slightly 1900 Pt given morphine  1915 Nitro gtt started 16mcg/min (RRT) 1920 per pt chest pain is the same 1922 post nitro 173/67 1925 Chest pain 4/10 nitro increased 89mcg/min(RRT) 1932 Chest pain 2/10 nitro increased to 63mcg/min(RRT) 1935 Chest 0/10 after increase of nitro 1940 Pt bp maintaining at 164/64 [place] arrived and placed second IV and increased nitro to 42mcg/min  Wife notified and is on her way to [facility].  Report called to SDU spoke with the [facility] and report given.Marland KitchenMarland Kitchen

## 2017-07-03 ENCOUNTER — Inpatient Hospital Stay (HOSPITAL_COMMUNITY): Payer: Medicare Other

## 2017-07-03 DIAGNOSIS — R0789 Other chest pain: Secondary | ICD-10-CM

## 2017-07-03 DIAGNOSIS — J9601 Acute respiratory failure with hypoxia: Secondary | ICD-10-CM

## 2017-07-03 DIAGNOSIS — N183 Chronic kidney disease, stage 3 (moderate): Secondary | ICD-10-CM

## 2017-07-03 LAB — BASIC METABOLIC PANEL
ANION GAP: 8 (ref 5–15)
BUN: 31 mg/dL — ABNORMAL HIGH (ref 6–20)
CALCIUM: 8.5 mg/dL — AB (ref 8.9–10.3)
CO2: 20 mmol/L — ABNORMAL LOW (ref 22–32)
Chloride: 106 mmol/L (ref 101–111)
Creatinine, Ser: 1.91 mg/dL — ABNORMAL HIGH (ref 0.61–1.24)
GFR, EST AFRICAN AMERICAN: 38 mL/min — AB (ref >60)
GFR, EST NON AFRICAN AMERICAN: 33 mL/min — AB (ref >60)
Glucose, Bld: 195 mg/dL — ABNORMAL HIGH (ref 65–99)
Potassium: 5.4 mmol/L — ABNORMAL HIGH (ref 3.5–5.1)
Sodium: 134 mmol/L — ABNORMAL LOW (ref 135–145)

## 2017-07-03 LAB — GLUCOSE, CAPILLARY: Glucose-Capillary: 164 mg/dL — ABNORMAL HIGH (ref 65–99)

## 2017-07-03 LAB — CBC
HEMATOCRIT: 32.3 % — AB (ref 39.0–52.0)
Hemoglobin: 10.6 g/dL — ABNORMAL LOW (ref 13.0–17.0)
MCH: 27.3 pg (ref 26.0–34.0)
MCHC: 32.8 g/dL (ref 30.0–36.0)
MCV: 83.2 fL (ref 78.0–100.0)
PLATELETS: 261 10*3/uL (ref 150–400)
RBC: 3.88 MIL/uL — AB (ref 4.22–5.81)
RDW: 14.8 % (ref 11.5–15.5)
WBC: 16.2 10*3/uL — AB (ref 4.0–10.5)

## 2017-07-03 LAB — TROPONIN I
TROPONIN I: 0.67 ng/mL — AB (ref <0.03)
Troponin I: 0.56 ng/mL (ref <0.03)

## 2017-07-03 MED ORDER — GUAIFENESIN-DM 100-10 MG/5ML PO SYRP
5.0000 mL | ORAL_SOLUTION | ORAL | Status: DC | PRN
  Administered 2017-07-03 – 2017-07-04 (×2): 5 mL via ORAL
  Filled 2017-07-03 (×2): qty 5

## 2017-07-03 MED ORDER — ALBUTEROL SULFATE (2.5 MG/3ML) 0.083% IN NEBU
2.5000 mg | INHALATION_SOLUTION | Freq: Four times a day (QID) | RESPIRATORY_TRACT | Status: DC | PRN
Start: 2017-07-04 — End: 2017-07-03

## 2017-07-03 MED ORDER — MORPHINE SULFATE (PF) 2 MG/ML IV SOLN
2.0000 mg | Freq: Once | INTRAVENOUS | Status: DC
  Filled 2017-07-03: qty 1

## 2017-07-03 MED ORDER — FUROSEMIDE 10 MG/ML IJ SOLN
80.0000 mg | Freq: Two times a day (BID) | INTRAMUSCULAR | Status: DC
  Administered 2017-07-03 – 2017-07-05 (×4): 80 mg via INTRAVENOUS
  Filled 2017-07-03 (×4): qty 8

## 2017-07-03 MED ORDER — NITROGLYCERIN IN D5W 200-5 MCG/ML-% IV SOLN
0.0000 ug/min | INTRAVENOUS | Status: DC
  Administered 2017-07-03 (×2): 35 ug/min via INTRAVENOUS
  Administered 2017-07-04: 40 ug/min via INTRAVENOUS
  Filled 2017-07-03 (×3): qty 250

## 2017-07-03 MED ORDER — HYDRALAZINE HCL 50 MG PO TABS
50.0000 mg | ORAL_TABLET | Freq: Three times a day (TID) | ORAL | Status: DC
  Administered 2017-07-03 – 2017-07-08 (×15): 50 mg via ORAL
  Filled 2017-07-03 (×15): qty 1

## 2017-07-03 MED ORDER — ALBUTEROL SULFATE (2.5 MG/3ML) 0.083% IN NEBU
2.5000 mg | INHALATION_SOLUTION | Freq: Four times a day (QID) | RESPIRATORY_TRACT | Status: DC
  Administered 2017-07-03 (×2): 2.5 mg via RESPIRATORY_TRACT
  Filled 2017-07-03 (×2): qty 3

## 2017-07-03 MED ORDER — OXYCODONE HCL 5 MG PO TABS: 5.0000 mg | ORAL_TABLET | ORAL | Status: DC | PRN

## 2017-07-03 MED ORDER — ALBUTEROL SULFATE (2.5 MG/3ML) 0.083% IN NEBU
2.5000 mg | INHALATION_SOLUTION | Freq: Three times a day (TID) | RESPIRATORY_TRACT | Status: DC
  Filled 2017-07-03 (×3): qty 3

## 2017-07-03 MED ORDER — ALBUTEROL SULFATE (2.5 MG/3ML) 0.083% IN NEBU
2.5000 mg | INHALATION_SOLUTION | RESPIRATORY_TRACT | Status: DC | PRN
  Administered 2017-07-04: 2.5 mg via RESPIRATORY_TRACT
  Filled 2017-07-03: qty 3

## 2017-07-03 MED ORDER — INSULIN GLARGINE 100 UNIT/ML ~~LOC~~ SOLN
8.0000 [IU] | Freq: Every day | SUBCUTANEOUS | Status: DC
  Administered 2017-07-04: 8 [IU] via SUBCUTANEOUS
  Filled 2017-07-03: qty 0.08

## 2017-07-03 MED ORDER — MORPHINE SULFATE (PF) 2 MG/ML IV SOLN
INTRAVENOUS | Status: AC
  Administered 2017-07-03: 2 mg via INTRAMUSCULAR
  Filled 2017-07-03: qty 1

## 2017-07-03 MED ORDER — FUROSEMIDE 10 MG/ML IJ SOLN: 40.0000 mg | Freq: Once | INTRAMUSCULAR | Status: DC

## 2017-07-03 NOTE — Progress Notes (Signed)
HPI  Patient is a 76 y/o M who has a significant hx for CAD as mentioned previously was sent to [facility] long as he was having hematuria related to his bladder cancer. At that time he experienced some chest pain for which cardiology was consulted as he had mildly elevated troponin's of 0.13 which peaked at 0.2. Chest pressure at that time resolved with 2 sublingual Nitro. Plan was to tx which medically considering his hematuria, CKD, Severe CAD. However, patient today developed substernal chest pressure that improved with 2 nitro and then recurred requiring 3 sublingual nitro to alleviate the pain. Pressures prior to this were elevated around 601-093 systolic. Given the recurrence of pain, I requested patient be transferred to [facility] cone for further evaluation and be placed on a NTG gtt. I recommended started at 5 mcg and up titration as required, but to notify me if the nitro is reaching close to 80mcg.   On arrival patient was on 57mcg. EKG was reviewed and showed no new changes, he had the pre-existing ST depressions in the  Anterior leads that were found on the most recent admission. He also had no change in troponin as troponin was 0.13. He remains free of chest pain and BP's appear to have improved into the 235'T systolic.   Allergies  Allergen Reactions  . Other Itching and Other (See Comments)    The iv dye used in Fundus Flourescein Angiography procedure  Kidney issues  . Penicillins Hives    Augmentin Has patient had a PCN reaction causing immediate rash, facial/tongue/throat swelling, SOB or lightheadedness with hypotension: No Has patient had a PCN reaction causing severe rash involving mucus membranes or skin necrosis: No Has patient had a PCN reaction that required hospitalization: Yes Has patient had a PCN reaction occurring within the last 10 years: Yes If all of the above answers are "NO", then may proceed with Cephalosporin use.     Current Facility-Administered Medications   Medication Dose Route Frequency Provider Last Rate Last Dose  . nitroGLYCERIN 0.2 mg/mL in dextrose 5 % infusion           . 0.9 %  sodium chloride infusion   Intravenous Continuous Regalado, Belkys A, MD 50 mL/hr at 07/02/17 1745    . acetaminophen (TYLENOL) tablet 650 mg  650 mg Oral Q6H PRN Regalado, Belkys A, MD       Or  . acetaminophen (TYLENOL) suppository 650 mg  650 mg Rectal Q6H PRN Regalado, Belkys A, MD      . amLODipine (NORVASC) tablet 10 mg  10 mg Oral Daily Regalado, Belkys A, MD   10 mg at 07/02/17 1057  . aspirin EC tablet 81 mg  81 mg Oral Daily Regalado, Belkys A, MD   81 mg at 07/02/17 1057  . atorvastatin (LIPITOR) tablet 20 mg  20 mg Oral q1800 Regalado, Belkys A, MD   20 mg at 07/02/17 1745  . cefTRIAXone (ROCEPHIN) 1 g in dextrose 5 % 50 mL IVPB  1 g Intravenous Q24H Regalado, Belkys A, MD   Stopped at 07/02/17 1404  . cholecalciferol (VITAMIN D) tablet 400 Units  400 Units Oral QHS Regalado, Belkys A, MD   400 Units at 07/01/17 2054  . clopidogrel (PLAVIX) tablet 75 mg  75 mg Oral Daily Regalado, Belkys A, MD   75 mg at 07/02/17 1057  . docusate sodium (COLACE) capsule 100 mg  100 mg Oral BID Regalado, Belkys A, MD   100 mg at  07/02/17 2246  . ferrous gluconate (FERGON) tablet 324 mg  324 mg Oral Q breakfast Regalado, Belkys A, MD   324 mg at 07/02/17 1057  . hydrALAZINE (APRESOLINE) tablet 50 mg  50 mg Oral BID Regalado, Belkys A, MD   50 mg at 07/02/17 2246  . insulin aspart (novoLOG) injection 0-9 Units  0-9 Units Subcutaneous TID WC Regalado, Belkys A, MD   2 Units at 07/02/17 1333  . insulin glargine (LANTUS) injection 5 Units  5 Units Subcutaneous Daily Regalado, Belkys A, MD   5 Units at 07/02/17 1057  . isosorbide mononitrate (IMDUR) 24 hr tablet 120 mg  120 mg Oral Daily Regalado, Belkys A, MD   120 mg at 07/02/17 1056  . magnesium oxide (MAG-OX) tablet 400 mg  400 mg Oral QPM Regalado, Belkys A, MD   400 mg at 07/02/17 1745  . metoprolol tartrate (LOPRESSOR)  tablet 50 mg  50 mg Oral BID Regalado, Belkys A, MD   50 mg at 07/02/17 2246  . morphine 4 MG/ML injection 0.52 mg  0.52 mg Intravenous Q4H PRN Regalado, Belkys A, MD   0.52 mg at 07/02/17 1906  . nitroGLYCERIN (NITROSTAT) 0.4 MG SL tablet           . nitroGLYCERIN (NITROSTAT) SL tablet 0.4 mg  0.4 mg Sublingual Q5 min PRN Regalado, Belkys A, MD      . nitroGLYCERIN 50 mg in dextrose 5 % 250 mL (0.2 mg/mL) infusion  0-200 mcg/min Intravenous Titrated Joscelin Fray C, MD      . ondansetron (ZOFRAN) tablet 4 mg  4 mg Oral Q6H PRN Regalado, Belkys A, MD       Or  . ondansetron (ZOFRAN) injection 4 mg  4 mg Intravenous Q6H PRN Regalado, Belkys A, MD      . traMADol (ULTRAM) tablet 50 mg  50 mg Oral Q6H PRN Regalado, Belkys A, MD        Past Medical History:  Diagnosis Date  . Anemia    a. + FOBT 08/2014.  Marland Kitchen Anginal pain ([place])    comes and goes; uses nitro to relieve   . Arthritis   . Atherosclerosis of native arteries of the extremities with intermittent claudication    a. L external iliac stent 2001, bilateral SFA occlusions documented 2001.  . Atrial fibrillation ([place])   . Bladder cancer ([place])   . CAD (coronary artery disease), native coronary artery 09/24/2013   a. CABG x 6 1999. b. Interim PCIs: Angioplasty SVG-PDA 2006.Taxus DES to native LCx in 2007.DES to SVG-PDA 2007, restented 2009.NSTEMI with PCI to SVG- PDA complicated by no-reflow/inferior infarction in 2012. c. Redo CABG 2015 with SVG-PD, SVG-D1, SVG-OM1.  . Carotid artery occlusion    a. Duplex 12/2013: mild plaque bilat, 40-59% BICA. F/u due 12/2014.  Marland Kitchen Chronic combined systolic and diastolic CHF (congestive heart failure) ([place])    a. Prior EF 45-50% in 08/2014. b. Improved EF >55% in 09/2014 at [facility].  . Chronic lower back pain   . CKD (chronic kidney disease) stage 3, GFR 30-59 ml/min   . COPD (chronic obstructive pulmonary disease) ([place])    pt states he doesn't have  . Daily headache    comes and goes pt denies daily  currently   . Dysrhythmia   . Esophageal stricture    a. History of dilitation - Long-standing history of esophageal reflux   . Gait instability   . GERD (gastroesophageal reflux disease)   . Gout   .  History of bronchitis   . History of gout   . History of hiatal hernia   . Hx of CABG 09/24/2013   a. 1999: LIMA to LAD, sequential SVG to diagonal 2 and diagonal 3, sequential SVG to PDA, acute marginal branch, and PL branch. b. Repeat CABG 2015 with SVG to D1, SVG to PDA, and SVG to OM.   Marland Kitchen Hyperlipidemia   . Hypertension   . Hypotonic bladder    Requiring in and out catheterization performed by the patient at home - occasional blood tinged urine (self-cath's daily for the last 2 years) and hematuria is chronic for him, followed by urology.  . MI (myocardial infarction) ([place]) 1999-02/2014   total of 7 MI per patient  . OSA (obstructive sleep apnea)    "don't wear mask"   . Polymyalgia rheumatica ([place])   . Self-catheterizes urinary bladder   . Shortness of breath dyspnea   . Skin cancer   . SVT (supraventricular tachycardia) ([place])   . Type II diabetes mellitus ([place])     Past Surgical History:  Procedure Laterality Date  . ABDOMINAL AORTAGRAM N/A 11/27/2014   Procedure: ABDOMINAL Maxcine Ham;  Surgeon: Wellington Hampshire, MD;  Location: [place] CATH LAB;  Service: Cardiovascular;  Laterality: N/A;  . ANKLE FRACTURE SURGERY Right ~ 1977   "10 plates and 7 screws placed in it"  . ANTERIOR CERVICAL DECOMP/DISCECTOMY FUSION    . BACK SURGERY     neck surgery x 2  . CARDIAC CATHETERIZATION    . CATARACT EXTRACTION W/ INTRAOCULAR LENS  IMPLANT, BILATERAL Bilateral 2000's  . COLONOSCOPY    . CORONARY ARTERY BYPASS GRAFT  1999  . CORONARY ARTERY BYPASS GRAFT N/A 03/01/2014   Procedure: REDO CORONARY ARTERY BYPASS GRAFTING TIMES THREE, USING RIGHT GREATER SAPHENOUS VEIN VIA ENDOVEIN HARVEST.;  Surgeon: Gaye Pollack, MD;  Location: [place] OR;  Service: Open Heart Surgery;  Laterality: N/A;  . CORONARY  STENT PLACEMENT     "I've had 8 put in; I presently have none" (08/26/2014)  . CYSTOSCOPY W/ RETROGRADES Bilateral 03/03/2015   Procedure: CYSTOSCOPY WITH LEFT RETROGRADE PYELOGRAM;  Surgeon: Kathie Rhodes, MD;  Location: [facility] ORS;  Service: Urology;  Laterality: Bilateral;  . CYSTOSCOPY WITH BIOPSY N/A 04/25/2017   Procedure: CYSTOSCOPY WITH BLADDER BIOPSY;  Surgeon: Kathie Rhodes, MD;  Location: [facility] ORS;  Service: Urology;  Laterality: N/A;  . ENDARTERECTOMY Left 01/30/2015   Procedure: ENDARTERECTOMY CAROTID WITH PRIMARY CLOSURE OF ARTERY;  Surgeon: Angelia Mould, MD;  Location: [facility] OR;  Service: Vascular;  Laterality: Left;  . ESOPHAGOGASTRODUODENOSCOPY (EGD) WITH ESOPHAGEAL DILATION  X 1  . EYE SURGERY     cataract surgery bilat   . FRACTURE SURGERY Right    ankle  . INTRAOPERATIVE TRANSESOPHAGEAL ECHOCARDIOGRAM N/A 03/01/2014   Procedure: INTRAOPERATIVE TRANSESOPHAGEAL ECHOCARDIOGRAM;  Surgeon: Gaye Pollack, MD;  Location: [facility] OR;  Service: Open Heart Surgery;  Laterality: N/A;  . IR NEPHROSTOMY PLACEMENT RIGHT  05/11/2017  . IR URETERAL STENT PLACEMENT EXISTING ACCESS RIGHT  05/25/2017  . LEFT HEART CATHETERIZATION WITH CORONARY/GRAFT ANGIOGRAM  02/26/2014   Procedure: LEFT HEART CATHETERIZATION WITH Beatrix Fetters;  Surgeon: Sinclair Grooms, MD;  Location[facility] CATH LAB;  Service: Cardiovascular;;  . LEFT HEART CATHETERIZATION WITH CORONARY/GRAFT ANGIOGRAM N/A 07/23/2014   Procedure: LEFT HEART CATHETERIZATION WITH Beatrix Fetters;  Surgeon: Sinclair Grooms, MD;  Location: [facility] CATH LAB;  Service: Cardiovascular;  Laterality: N/A;  . LEFT HEART CATHETERIZATION WITH CORONARY/GRAFT ANGIOGRAM N/A  08/30/2014   Procedure: LEFT HEART CATHETERIZATION WITH Beatrix Fetters;  Surgeon: Leonie Man, MD;  Location: [facility] CATH LAB;  Service: Cardiovascular;  Laterality: N/A;  . MOHS SURGERY  X 2   "nose; nose"  . MULTIPLE TOOTH EXTRACTIONS    . SKIN CANCER EXCISION     "all  over my head; ears; back  . TONSILLECTOMY    . TRANSURETHRAL RESECTION OF BLADDER TUMOR WITH GYRUS (TURBT-GYRUS) N/A 03/03/2015   Procedure: TRANSURETHRAL RESECTION OF BLADDER TUMOR;  Surgeon: Kathie Rhodes, MD;  Location: [facility];  Service: Urology;  Laterality: N/A;  . TRANSURETHRAL RESECTION OF BLADDER TUMOR WITH GYRUS (TURBT-GYRUS) N/A 04/28/2015   Procedure: TRANSURETHRAL RESECTION OF BLADDER TUMOR ;  Surgeon: Kathie Rhodes, MD;  Location: [facility];  Service: Urology;  Laterality: N/A;  . TRANSURETHRAL RESECTION OF BLADDER TUMOR WITH GYRUS (TURBT-GYRUS) N/A 11/24/2015   Procedure: TRANSURETHRAL RESECTION OF BLADDER TUMOR WITH GYRUS (TURBT-GYRUS);  Surgeon: Kathie Rhodes, MD;  Location: [facility];  Service: Urology;  Laterality: N/A;  . TRANSURETHRAL RESECTION OF PROSTATE N/A 04/25/2017   Procedure: TRANSURETHRAL RESECTION OF THE PROSTATE (TURP);  Surgeon: Kathie Rhodes, MD;  Location: [facility] ORS;  Service: Urology;  Laterality: N/A;    ROS:  Positive for Chest pressure, hematuria, SOB. Denies dizziness. PHYSICAL EXAM BP (!) 145/58 (BP Location: Right Arm)   Pulse 65   Temp 98.1 F (36.7 C) (Oral)   Resp 17   Ht 5\' 6"  (1.676 m)   Wt 75.1 kg (165 lb 9.1 oz)   SpO2 92%   BMI 26.72 kg/m   General: AAO x 3, NAD CVS: NS1,S2, no murmurs or rubs Resp: b/l wheezing Abd: + BS, S, NT, ND Ext: No edema  EKG:  ST depressions as noted in the anterior leads.  ASSESSMENT AND PLAN  CAD with anginal chest pain, NSTEMI - as mentioned with 6V CABG in 1999 and repeat 3V CABG in 2015. -  Last cardiac cath in November 2015 with severe left main/LAD stenosis but patent LIMA to LAD. The vein graft to the Diagonal branch was patent. The vein graft to the second and third diagonal branches was patent with moderate disease. The native Circumflex has severe proximal disease. The small OM branch filled from the patent vein graft. The native RCA was occluded proximally.  - Will attempt to wean the ntg with up titration of  his BP medications. Will consider escalating the hydralazine and may consider initiation of ranexa/ changing lopressor to coreg.   - Given the bladder cancer and CKD stage III, patient may not be ideal for percutaneous intervention. He is undergo chemo and has been battling this cancer since 2016.  COPD, HTN, HLP, Type 2 DM, carotid disease, paroxysmal afib (not on anticoagulation)?, PAD  Will obtain echo in the AM. Will recheck H and H and transfuse if Hb < 9. Currently still on ASA and plavix.  Dr. Tamala Julian primary cardiologist.

## 2017-07-03 NOTE — Progress Notes (Signed)
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 He has catheterized himself for 5 years. The day prior to admission he had difficulty w/ his catheter clotting w/ blood, w/ gross hematuria and clots. He had an episode of chest pain after he received a Bicarb amp in the ED.  ST changes were noted on his EKG, and therefore Cardiology was consulted.  The pt was transferred to [facility] for Cardiology evaluation.  Subjective: Feels SOB.  Denies cp, n/v, or abdom pain.    Assessment & Plan:  Gross Hematuria w/ obstructive uropathy Renal US noted mild left hydronephrosis, similar to recent PET-CT - Urology has assessed - foley cath to be kept in place for 7 days - flush foley prn cots w/ 200-300cc saline   Possible UTI Urine cx not convincing for infection - complete 5 day course of abx and then d/c  Bladder CA Followed by Urology - to have cystectomy "in coming weeks"  Normocytic anemia  Hgb stable at this time   Recent Labs Lab 07/01/17 1130 07/02/17 0450 07/02/17 1850 07/03/17 1156  HGB 10.7* 9.4* 9.2* 10.6*    Acute Renal Failure on CKD Stage 3 Baseline crt 1.8-2.2 - stable at baseline at this time   Recent Labs Lab 07/01/17 1130 07/01/17 1703 07/02/17 0450  CREATININE 2.42* 2.07* 1.79*    Parox afib Does not appear to be on chronic anticaog - presently in NSR  Chest pain - CAD s/p CABG x6 '99 and redo-CABG x3 '15 Cards following - plan is to continue medical tx for now   Recent Labs Lab 07/01/17 1441 07/01/17 1703 07/01/17 2312 07/02/17 0450 07/02/17 1851  TROPONINI 0.07* 0.13* 0.18* 0.20* 0.13*    Chronic combined systolic and diastolic CHF Baseline weight apperas to be as low as 71kg - diurese and follow  Filed Weights   07/02/17 0513 07/02/17 2057 07/03/17 0658  Weight: 74.4 kg (164 lb 0.4 oz) 75.1 kg (165 lb 9.1 oz) 73.7 kg (162 lb 6.4 oz)    DM CBG variable - follow w/o change for now   HTN Not well controlled presently - adjust medical tx and follow w/ diuresis   HLD  COPD Well compensated presently   DVT prophylaxis: SCDs Code Status: FULL CODE Family Communication: spoke w/ wife at bedside Disposition Plan:   Consultants:  Cardiology  Urology   Procedures: none  Antimicrobials:  Rocephin 9/14 >  Objective: Blood pressure (!) 170/65, pulse 74, temperature 98.7 F (37.1 C), temperature source Oral, resp. rate (!) 24, height 5\' 6"  (1.676 m), weight 73.7 kg (162 lb 6.4 oz), SpO2 96 %.  Intake/Output Summary (Last 24 hours) at 07/03/17 1346 Last data filed at 07/03/17 1000  Gross per 24 hour  Intake             1310 ml  Output              600 ml  Net              710 ml   Filed Weights   07/02/17 0513 07/02/17 2057 07/03/17 0658  Weight: 74.4 kg (164 lb 0.4 oz) 75.1 kg (165 lb 9.1 oz) 73.7 kg (162 lb 6.4 oz)    Examination: General: No  acute respiratory distress Lungs: fine crackles diffusely - no wheezing  Cardiovascular: Regular rate and rhythm without murmur Abdomen: Nontender, nondistended, soft, bowel sounds positive, no rebound, no ascites, no appreciable mass Extremities: 1+ B LE edema   CBC:  Recent Labs Lab 07/01/17 1130 07/02/17 0450 07/02/17 1850 07/03/17 1156  WBC 15.1* 10.9* 13.0* 16.2*  HGB 10.7* 9.4* 9.2* 10.6*  HCT 31.5* 27.7* 27.6* 32.3*  MCV 81.6 80.5 82.9 83.2  PLT 268 219 244 967   Basic Metabolic Panel:  Recent Labs Lab 07/01/17 1130 07/01/17 1703 07/02/17 0450  NA 131* 136 139  K 6.0* 4.6 4.8  CL 98* 99* 106  CO2 25 25 28   GLUCOSE 244* 205* 83  BUN 42* 38* 36*  CREATININE 2.42* 2.07* 1.79*  CALCIUM 8.9 8.7* 8.4*   GFR: Estimated Creatinine Clearance: 32.2 mL/min (A) (by C-G formula based on SCr of 1.79 mg/dL (H)).  Liver  Function Tests: No results for input(s): AST, ALT, ALKPHOS, BILITOT, PROT, ALBUMIN in the last 168 hours. No results for input(s): LIPASE, AMYLASE in the last 168 hours. No results for input(s): AMMONIA in the last 168 hours.  Cardiac Enzymes:  Recent Labs Lab 07/01/17 1441 07/01/17 1703 07/01/17 2312 07/02/17 0450 07/02/17 1851  TROPONINI 0.07* 0.13* 0.18* 0.20* 0.13*    HbA1C: Hgb A1c MFr Bld  Date/Time Value Ref Range Status  04/21/2017 09:38 AM 8.8 (H) 4.8 - 5.6 % Final    Comment:    (NOTE)         Pre-diabetes: 5.7 - 6.4         Diabetes: >6.4         Glycemic control for adults with diabetes: <7.0   11/19/2015 09:45 AM 8.5 (H) 4.8 - 5.6 % Final    Comment:    (NOTE)         Pre-diabetes: 5.7 - 6.4         Diabetes: >6.4         Glycemic control for adults with diabetes: <7.0     CBG:  Recent Labs Lab 07/02/17 0720 07/02/17 1256 07/02/17 1641 07/02/17 2256 07/03/17 0621  GLUCAP 80 151* 76 221* 164*    Recent Results (from the past 240 hour(s))  Urine culture     Status: Abnormal   Collection Time: 07/01/17 11:30 AM  Result Value Ref Range Status   Specimen Description URINE, CATHETERIZED  Final   Special Requests NONE  Final   Culture MULTIPLE SPECIES PRESENT, SUGGEST RECOLLECTION (A)  Final   Report Status 07/02/2017 FINAL  Final     Scheduled Meds: . albuterol  2.5 mg Nebulization Q6H  . amLODipine  10 mg Oral Daily  . aspirin EC  81 mg Oral Daily  . atorvastatin  20 mg Oral q1800  . cholecalciferol  400 Units Oral QHS  . clopidogrel  75 mg Oral Daily  . docusate sodium  100 mg Oral BID  . ferrous gluconate  324 mg Oral Q breakfast  . furosemide  40 mg Intravenous Once  . hydrALAZINE  50 mg Oral TID  . insulin aspart  0-9 Units Subcutaneous TID WC  . insulin glargine  5 Units Subcutaneous Daily  . isosorbide mononitrate  120 mg Oral Daily  . magnesium oxide  400 mg Oral QPM  . metoprolol tartrate  50 mg Oral BID     LOS: 2 days    Cherene Altes, MD [facility] Office  [phone] Pager - Text Page per Shea Evans  as per below:  On-Call/Text Page:      [email or web address]      password [email or web address]  If 7PM-7AM, please contact night-coverage [email or web address] Password [email or web address] 07/03/2017, 1:46 PM

## 2017-07-03 NOTE — Progress Notes (Signed)
Pt had an episode of SOB and stated that he "felt congested" Robitussin was administered and 2 l O2 applied. Pt got some relief. We'll continue to monitor.

## 2017-07-03 NOTE — Progress Notes (Signed)
Administered 2 mg Morphine IV for chest pain

## 2017-07-03 NOTE — Progress Notes (Signed)
Fellow transferred patient last night; for ease of transfer in middle of the night, he accepted onto cardiology service. Pt was admitted by IM @ [facility] for uropathy issues. Spoke with IM coordinator who confirms patient will be followed by team 1 at [facility] with IM; appreciate rounding team deciding if appropriate to go back on IM service as primary as over at [facility].  Tomie Spizzirri PA-C

## 2017-07-03 NOTE — Progress Notes (Signed)
Called Echo spoke with Candice, Echocardiogrm will be done tomorrow am.

## 2017-07-03 NOTE — Progress Notes (Signed)
Troponin trending up from morning. As described earlier in rounding note, risk of cath given his ongoing hematuria and renal dysfunction, overall high threshold for procedure. He has no current chest pain. Cycle enzymes overnight. F/u echo. Would not start anticoag at this time due to ongoing hematuria. If drop in LVEF, severe refractoy chest pain, or severe troponin elevation may warrant cath.    Carlyle Dolly MD

## 2017-07-03 NOTE — Progress Notes (Signed)
Progress Note  Patient Name: Neill Loft Date of Encounter: 07/03/2017   Subjective   Chest pain yesterday, none this AM  Inpatient Medications    Scheduled Meds: . amLODipine  10 mg Oral Daily  . aspirin EC  81 mg Oral Daily  . atorvastatin  20 mg Oral q1800  . cholecalciferol  400 Units Oral QHS  . clopidogrel  75 mg Oral Daily  . docusate sodium  100 mg Oral BID  . ferrous gluconate  324 mg Oral Q breakfast  . hydrALAZINE  50 mg Oral BID  . insulin aspart  0-9 Units Subcutaneous TID WC  . insulin glargine  5 Units Subcutaneous Daily  . isosorbide mononitrate  120 mg Oral Daily  . magnesium oxide  400 mg Oral QPM  . metoprolol tartrate  50 mg Oral BID   Continuous Infusions: . sodium chloride 50 mL/hr at 07/02/17 1745  . cefTRIAXone (ROCEPHIN)  IV Stopped (07/02/17 1404)  . nitroGLYCERIN     PRN Meds: acetaminophen **OR** acetaminophen, guaiFENesin-dextromethorphan, morphine injection, nitroGLYCERIN, ondansetron **OR** ondansetron (ZOFRAN) IV, traMADol   Vital Signs    Vitals:   07/03/17 0453 07/03/17 0658 07/03/17 0825 07/03/17 1000  BP: (!) 159/66  (!) 156/59 (!) 170/65  Pulse: 76  74   Resp: 20  (!) 24   Temp: 98.3 F (36.8 C)  98.7 F (37.1 C) 98.7 F (37.1 C)  TempSrc: Oral  Oral Oral  SpO2: 93%  96%   Weight:  162 lb 6.4 oz (73.7 kg)    Height:  5\' 6"  (1.676 m)      Intake/Output Summary (Last 24 hours) at 07/03/17 1139 Last data filed at 07/03/17 1000  Gross per 24 hour  Intake             1650 ml  Output             1150 ml  Net              500 ml   Filed Weights   07/02/17 0513 07/02/17 2057 07/03/17 0658  Weight: 164 lb 0.4 oz (74.4 kg) 165 lb 9.1 oz (75.1 kg) 162 lb 6.4 oz (73.7 kg)    Telemetry    SR - Personally Reviewed  ECG     GEN: No acute distress.   Neck: No JVD Cardiac: RRR, no murmurs, rubs, or gallops.  Respiratory: Clear to auscultation bilaterally. GI: Soft, nontender, non-distended  MS: No edema; No  deformity. Neuro:  Nonfocal  Psych: Normal affect   Labs    Chemistry Recent Labs Lab 07/01/17 1130 07/01/17 1703 07/02/17 0450  NA 131* 136 139  K 6.0* 4.6 4.8  CL 98* 99* 106  CO2 25 25 28   GLUCOSE 244* 205* 83  BUN 42* 38* 36*  CREATININE 2.42* 2.07* 1.79*  CALCIUM 8.9 8.7* 8.4*  GFRNONAA 25* 30* 35*  GFRAA 28* 34* 41*  ANIONGAP 8 12 5      Hematology Recent Labs Lab 07/01/17 1130 07/02/17 0450 07/02/17 1850  WBC 15.1* 10.9* 13.0*  RBC 3.86* 3.44* 3.33*  HGB 10.7* 9.4* 9.2*  HCT 31.5* 27.7* 27.6*  MCV 81.6 80.5 82.9  MCH 27.7 27.3 27.6  MCHC 34.0 33.9 33.3  RDW 14.5 14.5 14.8  PLT 268 219 244    Cardiac Enzymes Recent Labs Lab 07/01/17 1703 07/01/17 2312 07/02/17 0450 07/02/17 1851  TROPONINI 0.13* 0.18* 0.20* 0.13*   No results for input(s): TROPIPOC in the last 168 hours.  BNPNo results for input(s): BNP, PROBNP in the last 168 hours.   DDimer No results for input(s): DDIMER in the last 168 hours.   Radiology    US Renal  Result Date: 07/02/2017 CLINICAL DATA:  Gross hematuria. EXAM: RENAL / URINARY TRACT ULTRASOUND COMPLETE COMPARISON:  PET-CT dated June 07, 2017. Renal ultrasound dated December 25, 2014. FINDINGS: Right Kidney: Length: 10.2 cm. Echogenicity within normal limits. No mass or hydronephrosis visualized. Left Kidney: Length: 11.2 cm. Echogenicity within normal limits. Mild hydronephrosis. No mass visualized. Bladder: Decompressed by Foley catheter. IMPRESSION: Mild left hydronephrosis, similar to recent PET-CT, allowing for differences in technique. Electronically Signed   By: Titus Dubin M.D.   On: 07/02/2017 10:42   Dg Chest Port 1 View  Result Date: 07/01/2017 CLINICAL DATA:  History of bladder cancer. EXAM: PORTABLE CHEST 1 VIEW COMPARISON:  PET-CT 06/07/2017. FINDINGS: Prior CABG. Cardiomegaly with pulmonary vascular prominence. Mild interstitial prominence. Mild component congestive heart failure cannot be excluded. No pleural  effusion or pneumothorax. IMPRESSION: Prior CABG. A mild component of congestive heart failure cannot be excluded. Electronically Signed   By: Marcello Moores  Register   On: 07/01/2017 15:02    Cardiac Studies     Patient Profile   Irene C Herringis a 76 y.o.malewith a hx of former tobacco abuse, COPD, sleep apnea, CAD s/p CABG and redo CABG, chronic combined systolic and diastolic CHF, stage 3 CKD, HTN, HLD, DM, carotid artery disease, atrial fibrillation on chronic anti-coagulation therapy, PAD who is being seen today for the evaluation of chest painat the request of Dr. Tyrell Antonio.   Assessment & Plan    1. CAD/chest pain - with 6V CABG in 1999 and repeat 3V CABG in 2015 - Last cardiac cath in November 2015 with severe left main/LAD stenosis but patent LIMA to LAD. The vein graft to the Diagonal Danissa Rundle was patent. The vein graft to the second and third diagonal branches was patent with moderate disease. The native Circumflex has severe proximal disease. The small OM Xaniyah Buchholz filled from the patent vein graft. The native RCA was occluded proximally. - episode of chest pain that resolved with NG x 2.  - trop 0.07-->0.13-->0.18-->0.20 - EKG SR, RBBB lateral ST depressions fairly chronic - in setting of CKD 3-4 and ongoing hematuria, combined with absence of ongoing chest pain and fairly mild troponin elevation continuing conservative medical therapy alone at this time. Hgb down 10.7 to 9.4.  Medical therapy with ASA, atorva, plavix, imdur, lopressor. No ACE/ARB due to poor renal function. If Hgb drops below 9 would consider transfusion, if further drop may also need to consider holding antiplatelet agents.   - repeat episode of chest pain overnight in setting of HTN, started on NG drip and transferred to [facility].  - EKG continued anterior/anterolateral ST depressions fairly chronic. Despite recurrent chest pain troponin continues to trend down. Echo pending - difficult situation given his chest  pain in setting of ongoing hematuria. High threshold for cath given ongoing bleeding and his renal dysfunction. He could potentialyl be at higher risk he were to receive a stent and have to stop DAPT due to increased hematuria - f/u echo, if drop in LVEF risk vs benefit of cath may shift to consider cath. Continue current medical therapy.  - SOB this AM and wheezing. Will give nebs, IV lasix, CXR. Hold IVFs for now. BP's remain elevated, increase hydral to 50mg  tid. Continue NG drip today.     For questions or updates, please contact  [facility] Please consult [email or web address] for contact info under Cardiology/STEMI.      Merrily Pew, MD  07/03/2017, 11:39 AM

## 2017-07-03 NOTE — Progress Notes (Addendum)
Patient complaining of midsternal chest pain, although nitroglycerin drip was infusing at 65mcg/min (53ml/hr). Stopped nitroglycernin gtt, administered 3 nitrostats 5 minutes apart. Verbalized pain of 5 at onset decreased to 1/10 after third nitroglycerin. Patient is on 3 liters nasal cannula. Obtained EKG showing marked abnormality, possible antersoseptal subendocardial injury, placed on chart for MD review. Paged  Dr. Harl Bowie, Cardiology via [facility] and phone, awaiting callback.

## 2017-07-03 NOTE — Progress Notes (Signed)
Lab called with troponin level of 0.67, notified Dr. Harl Bowie via [facility]. Labs will be redrawn as some of the results did not generate.

## 2017-07-04 ENCOUNTER — Inpatient Hospital Stay (HOSPITAL_COMMUNITY): Payer: Medicare Other

## 2017-07-04 ENCOUNTER — Other Ambulatory Visit: Payer: Medicare Other

## 2017-07-04 DIAGNOSIS — I5032 Chronic diastolic (congestive) heart failure: Secondary | ICD-10-CM

## 2017-07-04 DIAGNOSIS — I25111 Atherosclerotic heart disease of native coronary artery with angina pectoris with documented spasm: Secondary | ICD-10-CM

## 2017-07-04 DIAGNOSIS — I5030 Unspecified diastolic (congestive) heart failure: Secondary | ICD-10-CM

## 2017-07-04 DIAGNOSIS — D638 Anemia in other chronic diseases classified elsewhere: Secondary | ICD-10-CM

## 2017-07-04 LAB — CBC
HEMATOCRIT: 28.2 % — AB (ref 39.0–52.0)
HEMOGLOBIN: 9.2 g/dL — AB (ref 13.0–17.0)
MCH: 26.9 pg (ref 26.0–34.0)
MCHC: 32.6 g/dL (ref 30.0–36.0)
MCV: 82.5 fL (ref 78.0–100.0)
Platelets: 252 10*3/uL (ref 150–400)
RBC: 3.42 MIL/uL — ABNORMAL LOW (ref 4.22–5.81)
RDW: 14.9 % (ref 11.5–15.5)
WBC: 15.8 10*3/uL — AB (ref 4.0–10.5)

## 2017-07-04 LAB — COMPREHENSIVE METABOLIC PANEL
ALBUMIN: 2.5 g/dL — AB (ref 3.5–5.0)
ALT: 25 U/L (ref 17–63)
ANION GAP: 6 (ref 5–15)
AST: 17 U/L (ref 15–41)
Alkaline Phosphatase: 74 U/L (ref 38–126)
BUN: 31 mg/dL — AB (ref 6–20)
CHLORIDE: 106 mmol/L (ref 101–111)
CO2: 24 mmol/L (ref 22–32)
Calcium: 8.6 mg/dL — ABNORMAL LOW (ref 8.9–10.3)
Creatinine, Ser: 1.97 mg/dL — ABNORMAL HIGH (ref 0.61–1.24)
GFR calc Af Amer: 37 mL/min — ABNORMAL LOW (ref >60)
GFR, EST NON AFRICAN AMERICAN: 31 mL/min — AB (ref >60)
Glucose, Bld: 171 mg/dL — ABNORMAL HIGH (ref 65–99)
POTASSIUM: 4.9 mmol/L (ref 3.5–5.1)
Sodium: 136 mmol/L (ref 135–145)
Total Bilirubin: 0.3 mg/dL (ref 0.3–1.2)
Total Protein: 5.9 g/dL — ABNORMAL LOW (ref 6.5–8.1)

## 2017-07-04 LAB — TROPONIN I
TROPONIN I: 0.56 ng/mL — AB (ref <0.03)
Troponin I: 0.53 ng/mL (ref <0.03)

## 2017-07-04 LAB — ECHOCARDIOGRAM COMPLETE
HEIGHTINCHES: 66 in
Weight: 2556.8 oz

## 2017-07-04 LAB — GLUCOSE, CAPILLARY
Glucose-Capillary: 170 mg/dL — ABNORMAL HIGH (ref 65–99)
Glucose-Capillary: 163 mg/dL — ABNORMAL HIGH (ref 65–99)
Glucose-Capillary: 188 mg/dL — ABNORMAL HIGH (ref 65–99)

## 2017-07-04 MED ORDER — MORPHINE SULFATE (PF) 2 MG/ML IV SOLN
1.0000 mg | INTRAVENOUS | Status: DC | PRN
  Administered 2017-07-04: 2 mg via INTRAVENOUS

## 2017-07-04 MED ORDER — INSULIN GLARGINE 100 UNIT/ML ~~LOC~~ SOLN
10.0000 [IU] | Freq: Every day | SUBCUTANEOUS | Status: DC
  Administered 2017-07-05 – 2017-07-06 (×2): 10 [IU] via SUBCUTANEOUS
  Filled 2017-07-04 (×2): qty 0.1

## 2017-07-04 MED ORDER — PANTOPRAZOLE SODIUM 40 MG PO TBEC
40.0000 mg | DELAYED_RELEASE_TABLET | Freq: Every day | ORAL | Status: DC
  Administered 2017-07-04 – 2017-07-08 (×5): 40 mg via ORAL
  Filled 2017-07-04 (×4): qty 1

## 2017-07-04 NOTE — Progress Notes (Signed)
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 He has catheterized himself for 5 years. The day prior to admission he had difficulty w/ his catheter clotting w/ blood, w/ gross hematuria and clots. He had an episode of chest pain after he received a Bicarb amp in the ED.  ST changes were noted on his EKG, and therefore Cardiology was consulted.  The pt was transferred to [facility] for Cardiology evaluation.  Subjective: Pt tells me he feels much better today.  He denies current cp, n/v, or abdom pain.    Assessment & Plan:  Gross Hematuria w/ obstructive uropathy Renal US noted mild left hydronephrosis, similar to recent PET-CT - Urology has assessed - foley cath to be kept in place for 7 days - flush foley prn cots w/ 200-300cc saline - this morning there is no more gross hematuria w/ yellow clear urine in his foley bag  Possible UTI Urine cx not convincing for infection - complete 5 day course of abx and then d/c  Bladder CA Followed by Urology - to have cystectomy "in coming weeks"  Normocytic anemia  Hgb stable at this time   Recent Labs Lab 07/01/17 1130 07/02/17 0450 07/02/17 1850 07/03/17 1156 07/04/17 0301  HGB 10.7* 9.4* 9.2* 10.6* 9.2*    Acute Renal Failure on CKD Stage 3 Baseline crt 1.8-2.2 - stable at baseline at this time   Recent Labs Lab 07/01/17 1130 07/01/17 1703 07/02/17 0450 07/03/17 1538 07/04/17 0301  CREATININE 2.42* 2.07* 1.79* 1.91* 1.97*    Parox afib Does not appear to be on chronic anticaog - presently in NSR  Chest pain - CAD s/p CABG x6 '99 and redo-CABG x3 '15 Cards following - plan is to continue medical tx for now - did not tolerate attempt to stop nitro gtt yesterday, so now back on nitro gtt    Recent Labs Lab 07/01/17 1441 07/01/17 1703 07/01/17 2312 07/02/17 0450 07/02/17 1851 07/03/17 1156 07/03/17 2159 07/04/17 0301  TROPONINI 0.07* 0.13* 0.18* 0.20* 0.13* 0.67* 0.56* 0.56*    Chronic combined systolic and diastolic CHF Baseline weight apperas to be as low as 71kg - diurese and follow  Filed Weights   07/02/17 2057 07/03/17 0658 07/04/17 0600  Weight: 75.1 kg (165 lb 9.1 oz) 73.7 kg (162 lb 6.4 oz) 72.5 kg (159 lb 12.8 oz)    DM CBG variably controlled - gently adjust tx and follow   HTN Not well controlled presently - adjust medical tx and follow w/ diuresis   HLD  COPD Well compensated presently   DVT prophylaxis: SCDs Code Status: FULL CODE Family Communication: no family present at time of exam today  Disposition Plan: monitor on nitro gtt on cardiac tele floor   Consultants:  Cardiology  Urology   Procedures: none  Antimicrobials:  Rocephin 9/14 >  Objective: Blood pressure (!) 142/60, pulse 79, temperature 98.8 F (37.1 C), temperature source Oral, resp. rate (!) 28, height 5\' 6"  (1.676 m), weight 72.5 kg (159 lb 12.8 oz), SpO2 96 %.  Intake/Output Summary (Last 24 hours) at 07/04/17 1015 Last data filed at 07/04/17 0500  Gross per 24 hour  Intake              116  ml  Output             1450 ml  Net            -1334 ml   Filed Weights   07/02/17 2057 07/03/17 0658 07/04/17 0600  Weight: 75.1 kg (165 lb 9.1 oz) 73.7 kg (162 lb 6.4 oz) 72.5 kg (159 lb 12.8 oz)    Examination: General: No acute respiratory distress - alert and oriented  Lungs: CTA th/o - no wheezing  Cardiovascular: Regular rate and rhythm without murmur or rub  Abdomen: NT/ND, soft, bs+, no mass  Extremities: 1+ B LE edema w/o signif change   CBC:  Recent Labs Lab 07/01/17 1130 07/02/17 0450 07/02/17 1850 07/03/17 1156 07/04/17 0301  WBC 15.1* 10.9* 13.0* 16.2* 15.8*  HGB 10.7* 9.4* 9.2* 10.6* 9.2*  HCT 31.5* 27.7* 27.6* 32.3* 28.2*  MCV 81.6 80.5  82.9 83.2 82.5  PLT 268 219 244 261 858   Basic Metabolic Panel:  Recent Labs Lab 07/01/17 1130 07/01/17 1703 07/02/17 0450 07/03/17 1538 07/04/17 0301  NA 131* 136 139 134* 136  K 6.0* 4.6 4.8 5.4* 4.9  CL 98* 99* 106 106 106  CO2 25 25 28  20* 24  GLUCOSE 244* 205* 83 195* 171*  BUN 42* 38* 36* 31* 31*  CREATININE 2.42* 2.07* 1.79* 1.91* 1.97*  CALCIUM 8.9 8.7* 8.4* 8.5* 8.6*   GFR: Estimated Creatinine Clearance: 29.2 mL/min (A) (by C-G formula based on SCr of 1.97 mg/dL (H)).  Liver Function Tests:  Recent Labs Lab 07/04/17 0301  AST 17  ALT 25  ALKPHOS 74  BILITOT 0.3  PROT 5.9*  ALBUMIN 2.5*    Cardiac Enzymes:  Recent Labs Lab 07/02/17 0450 07/02/17 1851 07/03/17 1156 07/03/17 2159 07/04/17 0301  TROPONINI 0.20* 0.13* 0.67* 0.56* 0.56*    HbA1C: Hgb A1c MFr Bld  Date/Time Value Ref Range Status  04/21/2017 09:38 AM 8.8 (H) 4.8 - 5.6 % Final    Comment:    (NOTE)         Pre-diabetes: 5.7 - 6.4         Diabetes: >6.4         Glycemic control for adults with diabetes: <7.0   11/19/2015 09:45 AM 8.5 (H) 4.8 - 5.6 % Final    Comment:    (NOTE)         Pre-diabetes: 5.7 - 6.4         Diabetes: >6.4         Glycemic control for adults with diabetes: <7.0     CBG:  Recent Labs Lab 07/02/17 1256 07/02/17 1641 07/02/17 2256 07/03/17 0621 07/04/17 0618  GLUCAP 151* 76 221* 164* 170*    Recent Results (from the past 240 hour(s))  Urine culture     Status: Abnormal   Collection Time: 07/01/17 11:30 AM  Result Value Ref Range Status   Specimen Description URINE, CATHETERIZED  Final   Special Requests NONE  Final   Culture MULTIPLE SPECIES PRESENT, SUGGEST RECOLLECTION (A)  Final   Report Status 07/02/2017 FINAL  Final     Scheduled Meds: . albuterol  2.5 mg Nebulization TID  . amLODipine  10 mg Oral Daily  . aspirin EC  81 mg Oral Daily  . atorvastatin  20 mg Oral q1800  . cholecalciferol  400 Units Oral QHS  . clopidogrel  75  mg Oral Daily  . docusate sodium  100 mg Oral BID  . ferrous gluconate  324  mg Oral Q breakfast  . furosemide  80 mg Intravenous Q12H  . hydrALAZINE  50 mg Oral TID  . insulin aspart  0-9 Units Subcutaneous TID WC  . insulin glargine  8 Units Subcutaneous Daily  . isosorbide mononitrate  120 mg Oral Daily  . magnesium oxide  400 mg Oral QPM  . metoprolol tartrate  50 mg Oral BID  .  morphine injection  2 mg Intravenous Once     LOS: 3 days   Cherene Altes, MD [facility] Office  [phone] Pager - Text Page per Shea Evans as per below:  On-Call/Text Page:      [email or web address]      password [email or web address]  If 7PM-7AM, please contact night-coverage [email or web address] Password [email or web address] 07/04/2017, 10:15 AM

## 2017-07-04 NOTE — Progress Notes (Signed)
  Echocardiogram 2D Echocardiogram has been performed.  Nayeli Calvert 07/04/2017, 11:06 AM

## 2017-07-04 NOTE — Progress Notes (Signed)
Progress Note  Patient Name: [name] Date of Encounter: 07/04/2017  Primary Cardiologist: Tamala Julian  Subjective   [name] is a 76 year old gentleman with a history of coronary artery disease-status post coronary artery bypass grafting on 2 separate occasions. His admitted with progressive renal insufficiency, hematuria, and chest pain.  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 He's also had invasion of the prostatic urethra. He's had significant hematuria.  CAT scan has revealed pelvic adenopathy indicating stage IV metastatic disease.  He has significant chest pain last night when attempt was made to taper him off the nitroglycerin glycerin. He's back on nitroglycerin drip this morning and is feeling quite well.   Inpatient Medications    Scheduled Meds: . albuterol  2.5 mg Nebulization TID  . amLODipine  10 mg Oral Daily  . aspirin EC  81 mg Oral Daily  . atorvastatin  20 mg Oral q1800  . cholecalciferol  400 Units Oral QHS  . clopidogrel  75 mg Oral Daily  . docusate sodium  100 mg Oral BID  . ferrous gluconate  324 mg Oral Q breakfast  . furosemide  80 mg Intravenous Q12H  . hydrALAZINE  50 mg Oral TID  . insulin aspart  0-9 Units Subcutaneous TID WC  . insulin glargine  8 Units Subcutaneous Daily  . isosorbide mononitrate  120 mg Oral Daily  . magnesium oxide  400 mg Oral QPM  . metoprolol tartrate  50 mg Oral BID  .  morphine injection  2 mg Intravenous Once   Continuous Infusions: . cefTRIAXone (ROCEPHIN)  IV Stopped (07/03/17 2050)  . nitroGLYCERIN 35 mcg/min (07/03/17 2016)   PRN Meds: acetaminophen **OR** acetaminophen, albuterol, guaiFENesin-dextromethorphan, nitroGLYCERIN, ondansetron **OR** ondansetron (ZOFRAN) IV, oxyCODONE, traMADol   Vital Signs    Vitals:   07/03/17 2000 07/03/17 2047 07/04/17 0600 07/04/17 0901  BP:  139/66 (!) 155/68 (!) 142/60  Pulse:  71 77 79  Resp:  (!) 22 20 (!) 28    Temp:  98.7 F (37.1 C) 98.6 F (37 C) 98.8 F (37.1 C)  TempSrc:  Oral Oral Oral  SpO2: 96% 96% 96% 96%  Weight:   159 lb 12.8 oz (72.5 kg)   Height:        Intake/Output Summary (Last 24 hours) at 07/04/17 1040 Last data filed at 07/04/17 0500  Gross per 24 hour  Intake              116 ml  Output             1450 ml  Net            -1334 ml   Filed Weights   07/02/17 2057 07/03/17 0658 07/04/17 0600  Weight: 165 lb 9.1 oz (75.1 kg) 162 lb 6.4 oz (73.7 kg) 159 lb 12.8 oz (72.5 kg)    Telemetry    NSR  - Personally Reviewed  ECG     NSR  - Personally Reviewed  Physical Exam   GEN: No acute distress,  Elderly male, in NAD  Neck: No JVD Cardiac: RR, no significant  Murmur,   rubs, or gallops.  Respiratory: Clear to auscultation bilaterally. GI: Soft, nontender, non-distended  MS: No edema; No deformity. Neuro:  Nonfocal  Psych: Normal affect   Labs    Chemistry Recent Labs Lab 07/02/17 0450 07/03/17 1538 07/04/17 0301  NA 139 134* 136  K 4.8 5.4* 4.9  CL 106 106 106  CO2 28 20* 24  GLUCOSE 83 195* 171*  BUN 36* 31* 31*  CREATININE 1.79* 1.91* 1.97*  CALCIUM 8.4* 8.5* 8.6*  PROT  --   --  5.9*  ALBUMIN  --   --  2.5*  AST  --   --  17  ALT  --   --  25  ALKPHOS  --   --  74  BILITOT  --   --  0.3  GFRNONAA 35* 33* 31*  GFRAA 41* 38* 37*  ANIONGAP 5 8 6      Hematology Recent Labs Lab 07/02/17 1850 07/03/17 1156 07/04/17 0301  WBC 13.0* 16.2* 15.8*  RBC 3.33* 3.88* 3.42*  HGB 9.2* 10.6* 9.2*  HCT 27.6* 32.3* 28.2*  MCV 82.9 83.2 82.5  MCH 27.6 27.3 26.9  MCHC 33.3 32.8 32.6  RDW 14.8 14.8 14.9  PLT 244 261 252    Cardiac Enzymes Recent Labs Lab 07/02/17 1851 07/03/17 1156 07/03/17 2159 07/04/17 0301  TROPONINI 0.13* 0.67* 0.56* 0.56*   No results for input(s): TROPIPOC in the last 168 hours.   BNPNo results for input(s): BNP, PROBNP in the last 168 hours.   DDimer No results for input(s): DDIMER in the last 168 hours.    Radiology    Dg Chest Port 1 View  Result Date: 07/03/2017 CLINICAL DATA:  Shortness of breath EXAM: PORTABLE CHEST 1 VIEW COMPARISON:  07/01/2017 and previous FINDINGS: Previous median sternotomy and CABG. The heart is enlarged. There is interstitial pulmonary edema. No infiltrate, consolidation or collapse. No effusion. IMPRESSION: New development of interstitial pulmonary edema. Electronically Signed   By: Nelson Chimes M.D.   On: 07/03/2017 13:48    Cardiac Studies     Patient Profile     76 year old gentleman with known coronary artery disease and metastatic bladder cancer. He's admitted with hematuria, worsening renal failure, and chest pain.  Assessment & Plan    1. Coronary artery disease: Patient has metastatic bladder cancer and has hematuria.   I would agree with very conservative  approach with medical management.  Would continue IV nitroglycerin today and attempt to titrate him off tomorrow.  For questions or updates, please contact [place] Please consult [email or web address] for contact info under Cardiology/STEMI.      Signed, Mertie Moores, MD  07/04/2017, 10:40 AM

## 2017-07-04 NOTE — Care Management Note (Signed)
Case Management Note Marvetta Gibbons RN, BSN Unit 4E-Case Manager [phone]  Patient Details  Name: [name] MRN: [id number] Date of Birth: 12-09-1940  Subjective/Objective:   Pt admitted with hematuria - hx of self caths at home- ARF, also had episode of chest pain and EKG changes                 Action/Plan: PTA pt lived at home with wife- independent- CM to follow for d/c needs.   Expected Discharge Date:                  Expected Discharge Plan:  Home/Self Care  In-House Referral:     Discharge planning Services  CM Consult  Post Acute Care Choice:    Choice offered to:     DME Arranged:    DME Agency:     HH Arranged:    HH Agency:     Status of Service:  In process, will continue to follow  If discussed at Long Length of Stay Meetings, dates discussed:    Discharge Disposition:   Additional Comments:  [name], RN 07/04/2017, 11:50 AM

## 2017-07-05 DIAGNOSIS — J431 Panlobular emphysema: Secondary | ICD-10-CM

## 2017-07-05 DIAGNOSIS — C7911 Secondary malignant neoplasm of bladder: Secondary | ICD-10-CM

## 2017-07-05 DIAGNOSIS — N139 Obstructive and reflux uropathy, unspecified: Secondary | ICD-10-CM

## 2017-07-05 DIAGNOSIS — I480 Paroxysmal atrial fibrillation: Secondary | ICD-10-CM

## 2017-07-05 DIAGNOSIS — N3001 Acute cystitis with hematuria: Secondary | ICD-10-CM

## 2017-07-05 DIAGNOSIS — I5042 Chronic combined systolic (congestive) and diastolic (congestive) heart failure: Secondary | ICD-10-CM

## 2017-07-05 LAB — BASIC METABOLIC PANEL
Anion gap: 6 (ref 5–15)
BUN: 37 mg/dL — AB (ref 6–20)
CALCIUM: 8.5 mg/dL — AB (ref 8.9–10.3)
CO2: 25 mmol/L (ref 22–32)
CREATININE: 2.22 mg/dL — AB (ref 0.61–1.24)
Chloride: 104 mmol/L (ref 101–111)
GFR calc non Af Amer: 27 mL/min — ABNORMAL LOW (ref >60)
GFR, EST AFRICAN AMERICAN: 32 mL/min — AB (ref >60)
Glucose, Bld: 187 mg/dL — ABNORMAL HIGH (ref 65–99)
Potassium: 4.4 mmol/L (ref 3.5–5.1)
SODIUM: 135 mmol/L (ref 135–145)

## 2017-07-05 LAB — GLUCOSE, CAPILLARY
GLUCOSE-CAPILLARY: 223 mg/dL — AB (ref 65–99)
Glucose-Capillary: 206 mg/dL — ABNORMAL HIGH (ref 65–99)
Glucose-Capillary: 268 mg/dL — ABNORMAL HIGH (ref 65–99)

## 2017-07-05 LAB — TROPONIN I: TROPONIN I: 0.32 ng/mL — AB (ref <0.03)

## 2017-07-05 LAB — CBC
HCT: 25.5 % — ABNORMAL LOW (ref 39.0–52.0)
Hemoglobin: 8.3 g/dL — ABNORMAL LOW (ref 13.0–17.0)
MCH: 26.8 pg (ref 26.0–34.0)
MCHC: 32.5 g/dL (ref 30.0–36.0)
MCV: 82.3 fL (ref 78.0–100.0)
PLATELETS: 220 10*3/uL (ref 150–400)
RBC: 3.1 MIL/uL — AB (ref 4.22–5.81)
RDW: 14.9 % (ref 11.5–15.5)
WBC: 12.5 10*3/uL — AB (ref 4.0–10.5)

## 2017-07-05 MED ORDER — FUROSEMIDE 10 MG/ML IJ SOLN
40.0000 mg | Freq: Two times a day (BID) | INTRAMUSCULAR | Status: DC
  Administered 2017-07-05 – 2017-07-06 (×3): 40 mg via INTRAVENOUS
  Filled 2017-07-05 (×3): qty 4

## 2017-07-05 MED ORDER — RANOLAZINE ER 500 MG PO TB12
500.0000 mg | ORAL_TABLET | Freq: Two times a day (BID) | ORAL | Status: DC
  Administered 2017-07-05 – 2017-07-08 (×7): 500 mg via ORAL
  Filled 2017-07-05 (×7): qty 1

## 2017-07-05 NOTE — Care Management Important Message (Signed)
Important Message  Patient Details  Name: [name] MRN: [id number] Date of Birth: [birth year]   Medicare Important Message Given:  Yes    [name] 07/05/2017, 8:50 AM

## 2017-07-05 NOTE — Progress Notes (Addendum)
Progress Note  Patient Name: [name] Date of Encounter: 07/05/2017  Primary Cardiologist: Dr. Tamala Julian  Subjective   Pt remains on nitro drip, unable to wean yesterday. He states his chest pain is worse in the evening. Nursing to try to wean again today early afternoon.  Inpatient Medications    Scheduled Meds: . amLODipine  10 mg Oral Daily  . aspirin EC  81 mg Oral Daily  . atorvastatin  20 mg Oral q1800  . cholecalciferol  400 Units Oral QHS  . clopidogrel  75 mg Oral Daily  . docusate sodium  100 mg Oral BID  . ferrous gluconate  324 mg Oral Q breakfast  . furosemide  80 mg Intravenous Q12H  . hydrALAZINE  50 mg Oral TID  . insulin aspart  0-9 Units Subcutaneous TID WC  . insulin glargine  10 Units Subcutaneous Daily  . isosorbide mononitrate  120 mg Oral Daily  . magnesium oxide  400 mg Oral QPM  . metoprolol tartrate  50 mg Oral BID  .  morphine injection  2 mg Intravenous Once  . pantoprazole  40 mg Oral Q1200   Continuous Infusions: . cefTRIAXone (ROCEPHIN)  IV Stopped (07/04/17 1259)  . nitroGLYCERIN 55 mcg/min (07/05/17 0600)   PRN Meds: acetaminophen **OR** acetaminophen, albuterol, guaiFENesin-dextromethorphan, morphine injection, nitroGLYCERIN, ondansetron **OR** ondansetron (ZOFRAN) IV, oxyCODONE, traMADol   Vital Signs    Vitals:   07/04/17 2335 07/05/17 0415 07/05/17 0624 07/05/17 0910  BP:  (!) 141/89  (!) 159/61  Pulse:  73    Resp:  18  17  Temp: 98.4 F (36.9 C) 98.2 F (36.8 C)    TempSrc: Oral Oral    SpO2:  93%  93%  Weight:   158 lb 8 oz (71.9 kg)   Height:        Intake/Output Summary (Last 24 hours) at 07/05/17 1320 Last data filed at 07/05/17 0650  Gross per 24 hour  Intake          1422.61 ml  Output             1725 ml  Net          -302.39 ml   Filed Weights   07/03/17 0658 07/04/17 0600 07/05/17 0624  Weight: 162 lb 6.4 oz (73.7 kg) 159 lb 12.8 oz (72.5 kg) 158 lb 8 oz (71.9 kg)     Physical Exam   General:  Well developed, well nourished, male appearing in no acute distress. Head: Normocephalic, atraumatic.  Neck: Supple without bruits, no JVD. Lungs:  Resp regular and unlabored, CTA. Heart: RRR, S1, S2, no murmur; no rub. Abdomen: Soft, non-tender, non-distended with normoactive bowel sounds. No hepatomegaly. No rebound/guarding. No obvious abdominal masses. Extremities: No clubbing, cyanosis, no edema. Distal pedal pulses are 2+ bilaterally. Neuro: Alert and oriented X 3. Moves all extremities spontaneously. Psych: Normal affect.  Labs    Chemistry Recent Labs Lab 07/03/17 1538 07/04/17 0301 07/05/17 0333  NA 134* 136 135  K 5.4* 4.9 4.4  CL 106 106 104  CO2 20* 24 25  GLUCOSE 195* 171* 187*  BUN 31* 31* 37*  CREATININE 1.91* 1.97* 2.22*  CALCIUM 8.5* 8.6* 8.5*  PROT  --  5.9*  --   ALBUMIN  --  2.5*  --   AST  --  17  --   ALT  --  25  --   ALKPHOS  --  74  --   BILITOT  --  0.3  --   GFRNONAA 33* 31* 27*  GFRAA 38* 37* 32*  ANIONGAP 8 6 6      Hematology Recent Labs Lab 07/03/17 1156 07/04/17 0301 07/05/17 0333  WBC 16.2* 15.8* 12.5*  RBC 3.88* 3.42* 3.10*  HGB 10.6* 9.2* 8.3*  HCT 32.3* 28.2* 25.5*  MCV 83.2 82.5 82.3  MCH 27.3 26.9 26.8  MCHC 32.8 32.6 32.5  RDW 14.8 14.9 14.9  PLT 261 252 220    Cardiac Enzymes Recent Labs Lab 07/03/17 2159 07/04/17 0301 07/04/17 1002 07/05/17 0333  TROPONINI 0.56* 0.56* 0.53* 0.32*   No results for input(s): TROPIPOC in the last 168 hours.   BNPNo results for input(s): BNP, PROBNP in the last 168 hours.   DDimer No results for input(s): DDIMER in the last 168 hours.   Radiology    No results found.   Telemetry    NSR - Personally Reviewed  ECG    No new tracings - Personally Reviewed   Cardiac Studies   Echo 07/04/17: Study Conclusions - Left ventricle: The cavity size was normal. There was moderate   concentric hypertrophy. Systolic function was normal. The   estimated ejection fraction was  in the range of 60% to 65%.   Features are consistent with a pseudonormal left ventricular   filling pattern, with concomitant abnormal relaxation and   increased filling pressure (grade 2 diastolic dysfunction).   Doppler parameters are consistent with high ventricular filling   pressure. - Mitral valve: Valve area by pressure half-time: 2.32 cm^2. Valve   area by continuity equation (using LVOT flow): 1.89 cm^2. - Right atrium: The atrium was mildly dilated. - Pulmonary arteries: PA peak pressure: 40 mm Hg (S).  Patient Profile     76 y.o. male with a history of coronary artery disease-status post coronary artery bypass grafting on 2 separate occasions. His admitted with progressive renal insufficiency, hematuria, and chest pain. He has stage IV metastatic bladder cancer; conservative approach for his chest pain.  Assessment & Plan    1. Chest pain - troponin 0.67 --> 0.56 --> 0.53 --> 0.32 - echo with normal LVEF and grade 2 DD, no mention of WMA - nitro drip running  - imdur 120 mg daily - continue lopressor, norvasc, ASA, plavix, statin - attempt to wean nitro gtt early afternoon - will add ranexa for now   2. Diastolic heart failure - echo with grade 2 DD - appears euvolemic on exam - is diruesing on 80 mg IV BID - consider decreasing lasix as creatinine rising 2.22 (1.97)   Signed, Tami Lin Duke , PA-C 1:20 PM 07/05/2017 Pager: [phone]   Attending Note:   The patient was seen and examined.  Agree with assessment and plan as noted above.  Changes made to the above note as needed.  Patient seen and independently examined with  Doreene Adas, PA .   We discussed all aspects of the encounter. I agree with the assessment and plan as stated above.  1. CAD :  Symptoms c/w UAP. We are having difficulty titrating / tapering the NTG drip  Will try Ranexa  May also need the addition of morphine  We discussed cath again. He is at risk for complications that would  possibly be life threatening . He has significant renal insufficiency and is not a good candidate for dialysis.    I have spent a total of 40 minutes with patient reviewing hospital  notes , telemetry, EKGs, labs and examining patient as  well as establishing an assessment and plan that was discussed with the patient. > 50% of time was spent in direct patient care.    Thayer Headings, Brooke Bonito., MD, [facility] 07/05/2017, 2:38 PM [address]  [place] Pager [phone]

## 2017-07-05 NOTE — Progress Notes (Signed)
PROGRESS NOTE    [name]  GLO:[id number] DOB: 1940-12-11 DOA: 07/01/2017 PCP: Shirline Frees, MD   Brief Narrative:  76 y.o. 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 The day prior to admission he started to have difficulty with catheter. The catheter got clotted with blood. He report gross hematuria and clots. He denies fever, chills. He has an episode of chest pain after he received a medication in the ED> '' Bicarb amp" . He received nitroglycerin. He is currently chest pain free. He had some ST changes in the EKG> case was reviewed with cardiology on call, who will see patient in consultation at [facility] long. The pt was transferred to [facility] for Cardiology evaluation    Subjective: 9/18 A/O 4, negative CP, negative SOB, negative N/V, positive suprapubic pain. Patient states was due to have a port placed today for chemotherapy. Due to start chemotherapy next week.     Assessment & Plan:   Active Problems:   CKD (chronic kidney disease) stage 3, GFR 30-59 ml/min   Chronic diastolic heart failure (HCC)   HTN (hypertension)   Anemia   Renal failure   AKI (acute kidney injury) (HCC)   Hyperkalemia   Troponin level elevated  Gross hematuria with obstructive uropathy  -Most likely secondary to metastatic bladder cancer. -Renal ultrasound noted mild left hydronephrosis similar recent PET/CT -Continue Foley for 7 days per urology: Flush Foley with 200-300 mL normal saline PRN clots -Hematuria resolved  UTI? -Complete empiric 5 day course. Antibiotics   Metastatic Bladder Cancer -On 9/19 contact Dr. Alen Blew Oncology and informed him patient has been admitted. Prior to discharge which he liked port placed? -Followed by urology: Cystoscopy in coming week   Normocytic anemia   Recent Labs Lab  07/01/17 1130 07/02/17 0450 07/02/17 1850 07/03/17 1156 07/04/17 0301 07/05/17 0333  HGB 10.7* 9.4* 9.2* 10.6* 9.2* 8.3*  -Trending down  -Occult blood pending -Most likely combination of metastatic cancer, hematuria. For completeness obtain anemia panel   Acute Renal Failure on CKD Stage 3(Baseline Cr 1.8-2.2)   Recent Labs Lab 07/01/17 1130 07/01/17 1703 07/02/17 0450 07/03/17 1538 07/04/17 0301 07/05/17 0333  CREATININE 2.42* 2.07* 1.79* 1.91* 1.97* 2.22*  -Stable continue to monitor  Paroxysmal atrial fibrillation  -Currently in NSR -Not on chronic anticoagulation and would not start given patient's hematuria.    Chest pain/CAD -S/P CABG 6 (1999) and redo-CABG 3 (2015) - CAD s/p CABG x6 '99 and redo-CABG x3 '15 -Per cardiology treat conservatively given patient's poor renal function. Patient and wife understand any type of procedure entailing thigh would most likely cause ESRD.    Chronic systolic and diastolic CHF (Base JOACZY~60 kg)  -Strict I&O -Daily weight Filed Weights   07/03/17 0658 07/04/17 0600 07/05/17 0624  Weight: 162 lb 6.4 oz (73.7 kg) 159 lb 12.8 oz (72.5 kg) 158 lb 8 oz (71.9 kg)  -Close to baseline  Essential HTN    Diabetes type 2  HLD   COPD Well compensated presently   Goals of care -9/18 PALLIATIVE CARE: Patient with metastatic bladder cancer, S/P CABG, chronic diastolic CHF uncontrolled HTN. Discuss short-term vs long-term goals care: Change CODE STATUS DNR: Hospice   DVT prophylaxis: SCD Code Status: Full Family Communication: Wife at bedside for discussion of plan care Disposition Plan: TBD   Consultants:  Urology Cardiology    Procedures/Significant Events:  None   I have personally reviewed and interpreted all radiology studies and my findings are as above.  VENTILATOR SETTINGS: None   Cultures 9/14 urine positive multiple species   Antimicrobials: Anti-infectives    Start     Stop   07/02/17 1200   cefTRIAXone (ROCEPHIN) 1 g in dextrose 5 % 50 mL IVPB     07/07/17 1159   07/01/17 1300  cefTRIAXone (ROCEPHIN) 1 g in dextrose 5 % 50 mL IVPB     07/01/17 1405       Devices    LINES / TUBES:      Continuous Infusions: . cefTRIAXone (ROCEPHIN)  IV Stopped (07/04/17 1259)  . nitroGLYCERIN 55 mcg/min (07/05/17 0600)     Objective: Vitals:   07/04/17 2300 07/04/17 2335 07/05/17 0415 07/05/17 0624  BP: (!) 130/51  (!) 141/89   Pulse: 65  73   Resp: 17  18   Temp:  98.4 F (36.9 C) 98.2 F (36.8 C)   TempSrc:  Oral Oral   SpO2: 95%  93%   Weight:    158 lb 8 oz (71.9 kg)  Height:        Intake/Output Summary (Last 24 hours) at 07/05/17 0901 Last data filed at 07/05/17 0650  Gross per 24 hour  Intake          1782.61 ml  Output             1725 ml  Net            57.61 ml   Filed Weights   07/03/17 0658 07/04/17 0600 07/05/17 0624  Weight: 162 lb 6.4 oz (73.7 kg) 159 lb 12.8 oz (72.5 kg) 158 lb 8 oz (71.9 kg)    Examination:  General: A/O 4, No acute respiratory distress  Neck:  Negative scars, masses, torticollis, lymphadenopathy, JVD Lungs: Clear to auscultation bilaterally without wheezes or crackles Cardiovascular: Regular rate and rhythm without murmur gallop or rub normal S1 and S2 Abdomen: Positive suprapubic abdominal pain, positive distention, positive soft, bowel sounds, no rebound, no ascites, no appreciable mass Extremities: No significant cyanosis, clubbing, or edema bilateral lower extremities Skin: Negative rashes, lesions, ulcers Central nervous system:  Cranial nerves II through XII intact, tongue/uvula midline, all extremities muscle strength 5/5, sensation intact throughout, negative dysarthria, negative expressive aphasia, negative receptive aphasia.  .     Data Reviewed: Care during the described time interval was provided by me .  I have reviewed this patient's available data, including medical history, events of note, physical  examination, and all test results as part of my evaluation.   CBC:  Recent Labs Lab 07/02/17 0450 07/02/17 1850 07/03/17 1156 07/04/17 0301 07/05/17 0333  WBC 10.9* 13.0* 16.2* 15.8* 12.5*  HGB 9.4* 9.2* 10.6* 9.2* 8.3*  HCT 27.7* 27.6* 32.3* 28.2* 25.5*  MCV 80.5 82.9 83.2 82.5 82.3  PLT 219 244 261 252 588   Basic Metabolic Panel:  Recent Labs Lab 07/01/17 1703 07/02/17 0450 07/03/17 1538 07/04/17 0301 07/05/17 0333  NA 136 139 134* 136 135  K 4.6 4.8 5.4* 4.9 4.4  CL 99* 106 106 106 104  CO2 25 28 20* 24 25  GLUCOSE 205* 83 195* 171* 187*  BUN 38* 36* 31* 31* 37*  CREATININE 2.07* 1.79* 1.91* 1.97* 2.22*  CALCIUM 8.7* 8.4* 8.5* 8.6* 8.5*   GFR: Estimated Creatinine Clearance: 25.9 mL/min (A) (by C-G formula based on SCr of 2.22  mg/dL (H)). Liver Function Tests:  Recent Labs Lab 07/04/17 0301  AST 17  ALT 25  ALKPHOS 74  BILITOT 0.3  PROT 5.9*  ALBUMIN 2.5*   No results for input(s): LIPASE, AMYLASE in the last 168 hours. No results for input(s): AMMONIA in the last 168 hours. Coagulation Profile: No results for input(s): INR, PROTIME in the last 168 hours. Cardiac Enzymes:  Recent Labs Lab 07/03/17 1156 07/03/17 2159 07/04/17 0301 07/04/17 1002 07/05/17 0333  TROPONINI 0.67* 0.56* 0.56* 0.53* 0.32*   BNP (last 3 results) No results for input(s): PROBNP in the last 8760 hours. HbA1C: No results for input(s): HGBA1C in the last 72 hours. CBG:  Recent Labs Lab 07/03/17 0621 07/04/17 0618 07/04/17 1145 07/04/17 1626 07/05/17 0610  GLUCAP 164* 170* 163* 188* 206*   Lipid Profile: No results for input(s): CHOL, HDL, LDLCALC, TRIG, CHOLHDL, LDLDIRECT in the last 72 hours. Thyroid Function Tests: No results for input(s): TSH, T4TOTAL, FREET4, T3FREE, THYROIDAB in the last 72 hours. Anemia Panel: No results for input(s): VITAMINB12, FOLATE, FERRITIN, TIBC, IRON, RETICCTPCT in the last 72 hours. Urine analysis:    Component Value  Date/Time   COLORURINE RED (A) 07/01/2017 1135   APPEARANCEUR TURBID (A) 07/01/2017 1135   LABSPEC  07/01/2017 1135    TEST NOT REPORTED DUE TO COLOR INTERFERENCE OF URINE PIGMENT   PHURINE  07/01/2017 1135    TEST NOT REPORTED DUE TO COLOR INTERFERENCE OF URINE PIGMENT   GLUCOSEU (A) 07/01/2017 1135    TEST NOT REPORTED DUE TO COLOR INTERFERENCE OF URINE PIGMENT   HGBUR (A) 07/01/2017 1135    TEST NOT REPORTED DUE TO COLOR INTERFERENCE OF URINE PIGMENT   BILIRUBINUR (A) 07/01/2017 1135    TEST NOT REPORTED DUE TO COLOR INTERFERENCE OF URINE PIGMENT   KETONESUR (A) 07/01/2017 1135    TEST NOT REPORTED DUE TO COLOR INTERFERENCE OF URINE PIGMENT   PROTEINUR (A) 07/01/2017 1135    TEST NOT REPORTED DUE TO COLOR INTERFERENCE OF URINE PIGMENT   UROBILINOGEN 1.0 03/16/2015 2130   NITRITE (A) 07/01/2017 1135    TEST NOT REPORTED DUE TO COLOR INTERFERENCE OF URINE PIGMENT   LEUKOCYTESUR (A) 07/01/2017 1135    TEST NOT REPORTED DUE TO COLOR INTERFERENCE OF URINE PIGMENT   Sepsis Labs: @LABRCNTIP (procalcitonin:4,lacticidven:4)  ) Recent Results (from the past 240 hour(s))  Urine culture     Status: Abnormal   Collection Time: 07/01/17 11:30 AM  Result Value Ref Range Status   Specimen Description URINE, CATHETERIZED  Final   Special Requests NONE  Final   Culture MULTIPLE SPECIES PRESENT, SUGGEST RECOLLECTION (A)  Final   Report Status 07/02/2017 FINAL  Final         Radiology Studies: Dg Chest Port 1 View  Result Date: 07/03/2017 CLINICAL DATA:  Shortness of breath EXAM: PORTABLE CHEST 1 VIEW COMPARISON:  07/01/2017 and previous FINDINGS: Previous median sternotomy and CABG. The heart is enlarged. There is interstitial pulmonary edema. No infiltrate, consolidation or collapse. No effusion. IMPRESSION: New development of interstitial pulmonary edema. Electronically Signed   By: Nelson Chimes M.D.   On: 07/03/2017 13:48        Scheduled Meds: . amLODipine  10 mg Oral Daily  .  aspirin EC  81 mg Oral Daily  . atorvastatin  20 mg Oral q1800  . cholecalciferol  400 Units Oral QHS  . clopidogrel  75 mg Oral Daily  . docusate sodium  100 mg Oral BID  . ferrous  gluconate  324 mg Oral Q breakfast  . furosemide  80 mg Intravenous Q12H  . hydrALAZINE  50 mg Oral TID  . insulin aspart  0-9 Units Subcutaneous TID WC  . insulin glargine  10 Units Subcutaneous Daily  . isosorbide mononitrate  120 mg Oral Daily  . magnesium oxide  400 mg Oral QPM  . metoprolol tartrate  50 mg Oral BID  .  morphine injection  2 mg Intravenous Once  . pantoprazole  40 mg Oral Q1200   Continuous Infusions: . cefTRIAXone (ROCEPHIN)  IV Stopped (07/04/17 1259)  . nitroGLYCERIN 55 mcg/min (07/05/17 0600)     LOS: 4 days    Time spent: 40 minutes    Jiya Kissinger, Geraldo Docker, MD [facility] Pager [phone]   If 7PM-7AM, please contact night-coverage [email or web address] Password [email or web address] 07/05/2017, 9:01 AM

## 2017-07-06 DIAGNOSIS — I214 Non-ST elevation (NSTEMI) myocardial infarction: Secondary | ICD-10-CM

## 2017-07-06 DIAGNOSIS — C772 Secondary and unspecified malignant neoplasm of intra-abdominal lymph nodes: Secondary | ICD-10-CM

## 2017-07-06 DIAGNOSIS — Z515 Encounter for palliative care: Secondary | ICD-10-CM

## 2017-07-06 DIAGNOSIS — C679 Malignant neoplasm of bladder, unspecified: Secondary | ICD-10-CM

## 2017-07-06 DIAGNOSIS — Z7189 Other specified counseling: Secondary | ICD-10-CM

## 2017-07-06 LAB — BASIC METABOLIC PANEL
Anion gap: 9 (ref 5–15)
BUN: 39 mg/dL — AB (ref 6–20)
CO2: 28 mmol/L (ref 22–32)
Calcium: 9 mg/dL (ref 8.9–10.3)
Chloride: 99 mmol/L — ABNORMAL LOW (ref 101–111)
Creatinine, Ser: 2.3 mg/dL — ABNORMAL HIGH (ref 0.61–1.24)
GFR calc Af Amer: 30 mL/min — ABNORMAL LOW (ref >60)
GFR, EST NON AFRICAN AMERICAN: 26 mL/min — AB (ref >60)
GLUCOSE: 186 mg/dL — AB (ref 65–99)
Potassium: 4.8 mmol/L (ref 3.5–5.1)
Sodium: 136 mmol/L (ref 135–145)

## 2017-07-06 LAB — GLUCOSE, CAPILLARY
GLUCOSE-CAPILLARY: 196 mg/dL — AB (ref 65–99)
GLUCOSE-CAPILLARY: 177 mg/dL — AB (ref 65–99)
Glucose-Capillary: 159 mg/dL — ABNORMAL HIGH (ref 65–99)

## 2017-07-06 LAB — CBC
HCT: 28.2 % — ABNORMAL LOW (ref 39.0–52.0)
HEMOGLOBIN: 9.2 g/dL — AB (ref 13.0–17.0)
MCH: 26.6 pg (ref 26.0–34.0)
MCHC: 32.6 g/dL (ref 30.0–36.0)
MCV: 81.5 fL (ref 78.0–100.0)
PLATELETS: 252 10*3/uL (ref 150–400)
RBC: 3.46 MIL/uL — ABNORMAL LOW (ref 4.22–5.81)
RDW: 14.5 % (ref 11.5–15.5)
WBC: 12.9 10*3/uL — ABNORMAL HIGH (ref 4.0–10.5)

## 2017-07-06 LAB — RETICULOCYTES
RBC.: 3.46 MIL/uL — AB (ref 4.22–5.81)
RETIC COUNT ABSOLUTE: 79.6 10*3/uL (ref 19.0–186.0)
RETIC CT PCT: 2.3 % (ref 0.4–3.1)

## 2017-07-06 LAB — IRON AND TIBC
Iron: 39 ug/dL — ABNORMAL LOW (ref 45–182)
SATURATION RATIOS: 17 % — AB (ref 17.9–39.5)
TIBC: 227 ug/dL — ABNORMAL LOW (ref 250–450)
UIBC: 188 ug/dL

## 2017-07-06 LAB — FOLATE: FOLATE: 9.5 ng/mL (ref >5.9)

## 2017-07-06 LAB — VITAMIN B12: VITAMIN B 12: 503 pg/mL (ref 180–914)

## 2017-07-06 LAB — MAGNESIUM: Magnesium: 1.8 mg/dL (ref 1.7–2.4)

## 2017-07-06 LAB — FERRITIN: Ferritin: 252 ng/mL (ref 24–336)

## 2017-07-06 MED ORDER — INSULIN GLARGINE 100 UNIT/ML ~~LOC~~ SOLN
14.0000 [IU] | Freq: Every day | SUBCUTANEOUS | Status: DC
  Administered 2017-07-07 – 2017-07-08 (×2): 14 [IU] via SUBCUTANEOUS
  Filled 2017-07-06 (×2): qty 0.14

## 2017-07-06 MED ORDER — TORSEMIDE 20 MG PO TABS
40.0000 mg | ORAL_TABLET | Freq: Every day | ORAL | Status: DC
  Administered 2017-07-07 – 2017-07-08 (×2): 40 mg via ORAL
  Filled 2017-07-06 (×2): qty 2

## 2017-07-06 MED ORDER — MORPHINE SULFATE (PF) 2 MG/ML IV SOLN: 1.0000 mg | INTRAVENOUS | Status: DC | PRN

## 2017-07-06 MED ORDER — FUROSEMIDE 40 MG PO TABS: 40.0000 mg | ORAL_TABLET | Freq: Every day | ORAL | Status: DC

## 2017-07-06 NOTE — Progress Notes (Signed)
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 He has catheterized himself for 5 years. The day prior to admission he had difficulty w/ his catheter clotting w/ blood, w/ gross hematuria and clots. He had an episode of chest pain after he received a Bicarb amp in the ED.  ST changes were noted on his EKG, and therefore Cardiology was consulted.  The pt was transferred to [facility] for Cardiology evaluation.  Subjective: The patient denies any chest pain today.  He tells me he feels much better at this time.  He denies shortness of breath abdominal pain or significant edema.  He is tolerating his supper without difficulty.    Assessment & Plan:  Gross Hematuria w/ obstructive uropathy Renal US noted mild left hydronephrosis, similar to recent PET-CT - Urology has assessed - foley cath to be kept in place for 7 days - flush foley prn clots w/ 200-300cc saline - urine remains clear at this time  Possible UTI Urine cx not convincing for infection - stop abx today and follow clinically   Bladder CA Followed by Urology - was to have cystectomy "in coming weeks" but now wants to discuss options - agree that he is an exceptionally poor candidate for general anesthesia at this time due to very high risk of MI  Normocytic anemia  Hgb stable at this time   Recent Labs Lab 07/02/17 1850 07/03/17 1156 07/04/17 0301 07/05/17 0333 07/06/17 0255  HGB 9.2* 10.6* 9.2* 8.3* 9.2*    Acute Renal Failure on CKD Stage 3 Baseline crt 1.8-2.2 - stable near baseline at this time   Recent Labs Lab 07/02/17 0450 07/03/17 1538 07/04/17 0301 07/05/17 0333 07/06/17 0255  CREATININE 1.79* 1.91* 1.97* 2.22* 2.30*    Parox afib Does not appear to be on chronic anticaog -  presently in NSR  Chest pain - CAD s/p CABG x6 '99 and redo-CABG x3 '15 Cards following - plan is to continue medical tx for now - appears to be having a good response to ranexa - now cp free off nitro gtt   Recent Labs Lab 07/01/17 2312 07/02/17 0450 07/02/17 1851 07/03/17 1156 07/03/17 2159 07/04/17 0301 07/04/17 1002 07/05/17 0333  TROPONINI 0.18* 0.20* 0.13* 0.67* 0.56* 0.56* 0.53* 0.32*    Chronic combined systolic and diastolic CHF Baseline weight apperas to be as low as 71kg - diuresed to baseline wgt - clinically stable  Filed Weights   07/04/17 0600 07/05/17 0624 07/06/17 0318  Weight: 72.5 kg (159 lb 12.8 oz) 71.9 kg (158 lb 8 oz) 71.5 kg (157 lb 11.2 oz)    DM CBG remains somewhat elevated - adjust tx again today   HTN Gently adjust med tx again today and monitor   HLD  COPD Well compensated presently   DVT prophylaxis: SCDs Code Status: FULL CODE Family Communication: spoke w/ wife at bedside  Disposition Plan: monitor on nitro gtt on cardiac tele floor   Consultants:  Cardiology  Urology   Procedures: none  Antimicrobials:  Rocephin 9/14 >  Objective: Blood pressure (!) 156/54, pulse 74, temperature 98.1 F (36.7 C), temperature source Oral, resp. rate 18, height 5\' 6"  (1.676 m), weight 71.5 kg (157 lb 11.2 oz), SpO2 93 %.  Intake/Output  Summary (Last 24 hours) at 07/06/17 1643 Last data filed at 07/06/17 1620  Gross per 24 hour  Intake            654.6 ml  Output             2925 ml  Net          -2270.4 ml   Filed Weights   07/04/17 0600 07/05/17 0624 07/06/17 0318  Weight: 72.5 kg (159 lb 12.8 oz) 71.9 kg (158 lb 8 oz) 71.5 kg (157 lb 11.2 oz)    Examination: General: No acute respiratory distress - alert/oriented  Lungs: CTA th/o - no wheezing or crackles  Cardiovascular: RRR w/o M or rub  Abdomen: NT/ND, soft, bs+, no mass  Extremities: no signif edema B LE    CBC:  Recent Labs Lab 07/02/17 1850 07/03/17 1156  07/04/17 0301 07/05/17 0333 07/06/17 0255  WBC 13.0* 16.2* 15.8* 12.5* 12.9*  HGB 9.2* 10.6* 9.2* 8.3* 9.2*  HCT 27.6* 32.3* 28.2* 25.5* 28.2*  MCV 82.9 83.2 82.5 82.3 81.5  PLT 244 261 252 220 536   Basic Metabolic Panel:  Recent Labs Lab 07/02/17 0450 07/03/17 1538 07/04/17 0301 07/05/17 0333 07/06/17 0255  NA 139 134* 136 135 136  K 4.8 5.4* 4.9 4.4 4.8  CL 106 106 106 104 99*  CO2 28 20* 24 25 28   GLUCOSE 83 195* 171* 187* 186*  BUN 36* 31* 31* 37* 39*  CREATININE 1.79* 1.91* 1.97* 2.22* 2.30*  CALCIUM 8.4* 8.5* 8.6* 8.5* 9.0  MG  --   --   --   --  1.8   GFR: Estimated Creatinine Clearance: 25 mL/min (A) (by C-G formula based on SCr of 2.3 mg/dL (H)).  Liver Function Tests:  Recent Labs Lab 07/04/17 0301  AST 17  ALT 25  ALKPHOS 74  BILITOT 0.3  PROT 5.9*  ALBUMIN 2.5*    Cardiac Enzymes:  Recent Labs Lab 07/03/17 1156 07/03/17 2159 07/04/17 0301 07/04/17 1002 07/05/17 0333  TROPONINI 0.67* 0.56* 0.56* 0.53* 0.32*    HbA1C: Hgb A1c MFr Bld  Date/Time Value Ref Range Status  04/21/2017 09:38 AM 8.8 (H) 4.8 - 5.6 % Final    Comment:    (NOTE)         Pre-diabetes: 5.7 - 6.4         Diabetes: >6.4         Glycemic control for adults with diabetes: <7.0   11/19/2015 09:45 AM 8.5 (H) 4.8 - 5.6 % Final    Comment:    (NOTE)         Pre-diabetes: 5.7 - 6.4         Diabetes: >6.4         Glycemic control for adults with diabetes: <7.0     CBG:  Recent Labs Lab 07/04/17 1626 07/05/17 0610 07/05/17 1104 07/05/17 1626 07/06/17 1129  GLUCAP 188* 206* 223* 268* 196*    Recent Results (from the past 240 hour(s))  Urine culture     Status: Abnormal   Collection Time: 07/01/17 11:30 AM  Result Value Ref Range Status   Specimen Description URINE, CATHETERIZED  Final   Special Requests NONE  Final   Culture MULTIPLE SPECIES PRESENT, SUGGEST RECOLLECTION (A)  Final   Report Status 07/02/2017 FINAL  Final     Scheduled Meds: .  amLODipine  10 mg Oral Daily  . aspirin EC  81 mg Oral Daily  . atorvastatin  20 mg  Oral q1800  . cholecalciferol  400 Units Oral QHS  . clopidogrel  75 mg Oral Daily  . docusate sodium  100 mg Oral BID  . ferrous gluconate  324 mg Oral Q breakfast  . furosemide  40 mg Intravenous Q12H  . hydrALAZINE  50 mg Oral TID  . insulin aspart  0-9 Units Subcutaneous TID WC  . insulin glargine  10 Units Subcutaneous Daily  . isosorbide mononitrate  120 mg Oral Daily  . magnesium oxide  400 mg Oral QPM  . metoprolol tartrate  50 mg Oral BID  .  morphine injection  2 mg Intravenous Once  . pantoprazole  40 mg Oral Q1200  . ranolazine  500 mg Oral BID     LOS: 5 days   Cherene Altes, MD [facility] Office  [phone] Pager - Text Page per Amion as per below:  On-Call/Text Page:      [email or web address]      password [email or web address]  If 7PM-7AM, please contact night-coverage [email or web address] Password [email or web address] 07/06/2017, 4:43 PM

## 2017-07-06 NOTE — Progress Notes (Signed)
Progress Note  Patient Name: [name] Date of Encounter: 07/06/2017  Primary Cardiologist: Tamala Julian   Subjective   76 yo with hx of CAD, CABG Admitted with progressive renal insufficiency, hematura, chest pain  Has metastatic bladder cancer  Had a much better night.  No angina Is off the NTG drip    Inpatient Medications    Scheduled Meds: . amLODipine  10 mg Oral Daily  . aspirin EC  81 mg Oral Daily  . atorvastatin  20 mg Oral q1800  . cholecalciferol  400 Units Oral QHS  . clopidogrel  75 mg Oral Daily  . docusate sodium  100 mg Oral BID  . ferrous gluconate  324 mg Oral Q breakfast  . furosemide  40 mg Intravenous Q12H  . hydrALAZINE  50 mg Oral TID  . insulin aspart  0-9 Units Subcutaneous TID WC  . insulin glargine  10 Units Subcutaneous Daily  . isosorbide mononitrate  120 mg Oral Daily  . magnesium oxide  400 mg Oral QPM  . metoprolol tartrate  50 mg Oral BID  .  morphine injection  2 mg Intravenous Once  . pantoprazole  40 mg Oral Q1200  . ranolazine  500 mg Oral BID   Continuous Infusions: . cefTRIAXone (ROCEPHIN)  IV Stopped (07/05/17 1504)  . nitroGLYCERIN Stopped (07/05/17 2111)   PRN Meds: acetaminophen **OR** acetaminophen, albuterol, guaiFENesin-dextromethorphan, morphine injection, nitroGLYCERIN, ondansetron **OR** ondansetron (ZOFRAN) IV, oxyCODONE, traMADol   Vital Signs    Vitals:   07/06/17 0318 07/06/17 0630 07/06/17 0635 07/06/17 0937  BP:   (!) 166/64 (!) 160/66  Pulse: 70  75 74  Resp: (!) 39  18   Temp:  98.1 F (36.7 C)    TempSrc:  Oral    SpO2: 90%  93%   Weight: 157 lb 11.2 oz (71.5 kg)     Height:        Intake/Output Summary (Last 24 hours) at 07/06/17 1015 Last data filed at 07/06/17 0246  Gross per 24 hour  Intake            894.6 ml  Output             1925 ml  Net          -1030.4 ml   Filed Weights   07/04/17 0600 07/05/17 0624 07/06/17 0318  Weight: 159 lb 12.8 oz (72.5 kg) 158 lb 8 oz (71.9 kg) 157 lb  11.2 oz (71.5 kg)    Telemetry    NSR  - Personally Reviewed  ECG     NSR  - Personally Reviewed  Physical Exam   GEN: No acute distress.  Elderly man, NAD  Neck: No JVD Cardiac: RRR, no murmurs, rubs, or gallops.  Respiratory: Clear to auscultation bilaterally. GI: Soft, nontender, non-distended  MS: No edema; No deformity. Neuro:  Nonfocal  Psych: Normal affect   Labs    Chemistry Recent Labs Lab 07/04/17 0301 07/05/17 0333 07/06/17 0255  NA 136 135 136  K 4.9 4.4 4.8  CL 106 104 99*  CO2 24 25 28   GLUCOSE 171* 187* 186*  BUN 31* 37* 39*  CREATININE 1.97* 2.22* 2.30*  CALCIUM 8.6* 8.5* 9.0  PROT 5.9*  --   --   ALBUMIN 2.5*  --   --   AST 17  --   --   ALT 25  --   --   ALKPHOS 74  --   --   BILITOT 0.3  --   --  GFRNONAA 31* 27* 26*  GFRAA 37* 32* 30*  ANIONGAP 6 6 9      Hematology Recent Labs Lab 07/04/17 0301 07/05/17 0333 07/06/17 0255  WBC 15.8* 12.5* 12.9*  RBC 3.42* 3.10* 3.46*  3.46*  HGB 9.2* 8.3* 9.2*  HCT 28.2* 25.5* 28.2*  MCV 82.5 82.3 81.5  MCH 26.9 26.8 26.6  MCHC 32.6 32.5 32.6  RDW 14.9 14.9 14.5  PLT 252 220 252    Cardiac Enzymes Recent Labs Lab 07/03/17 2159 07/04/17 0301 07/04/17 1002 07/05/17 0333  TROPONINI 0.56* 0.56* 0.53* 0.32*   No results for input(s): TROPIPOC in the last 168 hours.   BNPNo results for input(s): BNP, PROBNP in the last 168 hours.   DDimer No results for input(s): DDIMER in the last 168 hours.   Radiology    No results found.  Cardiac Studies     Patient Profile     76 y.o. male with severe CAD   Assessment & Plan    1. CAD :   Is now off the NTG drip We have started Ranexa. Plan is to increase dose to 1000 mg BID Given his medical issues, will continue with medical therapy for his CAD  I think that he could be discharged soon - perphaps tomorrow.   Will see if he remains stable today    For questions or updates, please contact [place] Please consult  [email or web address] for contact info under Cardiology/STEMI.      Signed, Mertie Moores, MD  07/06/2017, 10:15 AM

## 2017-07-06 NOTE — Consult Note (Signed)
Consultation Note Date: 07/06/2017   Patient Name: [name]  DOB: [birth year]  MRN: [id number]  Age / Sex: 76 y.o., male  PCP: Shirline Frees, MD Referring Physician: Cherene Altes, MD  Reason for Consultation: Establishing goals of care and Psychosocial/spiritual support  HPI/Patient Profile: 76 y.o. male  admitted on 07/01/2017 with a past medical history significant of bladder cancer with invasion into the prostatic urethra , A fib, CKD stage III  and CAD who admitted through the ER with hematuria.  H/O hypotonic bladder and patient has catheterized himself for last 5 years.   Patient had seen Dr Loin and Dr Tresa Moore for evaluation of recurrent bladder cancer, there was discussion about surgery and systemic chemotherapy. He does not know where he stands right now since this recent hospitalization.  Patient is hopeful to speak with oncology while he is hospitalize trying to sort out just what his options and next steps actually are.    Patient faces treatment option decisions, advanced directive decisions and anticipatory care needs.      Clinical Assessment and Goals of Care:  This NP Wadie Lessen reviewed medical records, received report from team, assessed the patient and then meet at the patient's bedside along with his wife  to discuss diagnosis, prognosis, GOC, EOL wishes disposition and options.  A detailed discussion was had today regarding advanced directives.  Concepts specific to code status, artifical feeding and hydration, continued IV antibiotics and rehospitalization was had.  The difference between a aggressive medical intervention path  and a palliative comfort care path for this patient at this time was had.  Values and goals of care important to patient and family were attempted to be elicited.  Patient clearly verbalizes his main focus is quality of life and not being"a burden"  to his wife.  MOST form introduced and Hard Choices left for review  Concept of Hospice and Palliative Care were discussed  Natural trajectory and expectations at EOL were discussed.  Questions and concerns addressed.   Family encouraged to call with questions or concerns.  PMT will continue to support holistically.    HCPOA/wife    SUMMARY OF RECOMMENDATIONS    Code Status/Advance Care Planning:  Full code   Encouraged to consider DNR/DNI status knowing poor outcomes in similar patients. Patient will think about this tonight and continue discussion tomorrow with this NP  Palliative Prophylaxis:   Aspiration, Bowel Regimen, Delirium Protocol and Frequent Pain Assessment  Additional Recommendations (Limitations, Scope, Preferences):  Patient awaits input from oncology.  Discussed with attending and he will request consult.  Psycho-social/Spiritual:   Desire for further Chaplaincy support:no  Additional Recommendations: Education on Hospice  Prognosis:   Unable to determine  Discharge Planning: To Be Determined      Primary Diagnoses: Present on Admission: . Renal failure . AKI (acute kidney injury) ([place]) . Anemia . CKD (chronic kidney disease) stage 3, GFR 30-59 ml/min . Chronic diastolic heart failure ([place]) . HTN (hypertension)   I have reviewed the medical record, interviewed  the patient and family, and examined the patient. The following aspects are pertinent.  Past Medical History:  Diagnosis Date  . Anemia    a. + FOBT 08/2014.  Marland Kitchen Anginal pain ([place])    comes and goes; uses nitro to relieve   . Arthritis   . Atherosclerosis of native arteries of the extremities with intermittent claudication    a. L external iliac stent 2001, bilateral SFA occlusions documented 2001.  . Atrial fibrillation ([place])   . Bladder cancer ([place])   . CAD (coronary artery disease), native coronary artery 09/24/2013   a. CABG x 6 1999. b. Interim PCIs: Angioplasty SVG-PDA  2006.Taxus DES to native LCx in 2007.DES to SVG-PDA 2007, restented 2009.NSTEMI with PCI to SVG- PDA complicated by no-reflow/inferior infarction in 2012. c. Redo CABG 2015 with SVG-PD, SVG-D1, SVG-OM1.  . Carotid artery occlusion    a. Duplex 12/2013: mild plaque bilat, 40-59% BICA. F/u due 12/2014.  Marland Kitchen Chronic combined systolic and diastolic CHF (congestive heart failure) ([place])    a. Prior EF 45-50% in 08/2014. b. Improved EF >55% in 09/2014 at [facility].  . Chronic lower back pain   . CKD (chronic kidney disease) stage 3, GFR 30-59 ml/min   . COPD (chronic obstructive pulmonary disease) ([place])    pt states he doesn't have  . Daily headache    comes and goes pt denies daily currently   . Dysrhythmia   . Esophageal stricture    a. History of dilitation - Long-standing history of esophageal reflux   . Gait instability   . GERD (gastroesophageal reflux disease)   . Gout   . History of bronchitis   . History of gout   . History of hiatal hernia   . Hx of CABG 09/24/2013   a. 1999: LIMA to LAD, sequential SVG to diagonal 2 and diagonal 3, sequential SVG to PDA, acute marginal branch, and PL branch. b. Repeat CABG 2015 with SVG to D1, SVG to PDA, and SVG to OM.   Marland Kitchen Hyperlipidemia   . Hypertension   . Hypotonic bladder    Requiring in and out catheterization performed by the patient at home - occasional blood tinged urine (self-cath's daily for the last 2 years) and hematuria is chronic for him, followed by urology.  . MI (myocardial infarction) ([place]) 1999-02/2014   total of 7 MI per patient  . OSA (obstructive sleep apnea)    "don't wear mask"   . Polymyalgia rheumatica ([place])   . Self-catheterizes urinary bladder   . Shortness of breath dyspnea   . Skin cancer   . SVT (supraventricular tachycardia) ([place])   . Type II diabetes mellitus ([place])    Social History   Social History  . Marital status: Married    Spouse name: N/A  . Number of children: N/A  . Years of education: N/A   Social  History Main Topics  . Smoking status: Former Smoker    Packs/day: 2.00    Years: 50.00    Types: Cigarettes    Quit date: 09/18/2015  . Smokeless tobacco: Never Used  . 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 Drug use: No  . Sexual activity: Not Asked   Other Topics Concern  . None   Social History Narrative   Tour manager          Family History  Problem Relation Age of Onset  . CAD Mother   .  Heart disease Mother   . Hypertension Mother   . Cirrhosis Brother   . Heart disease Father   . Hyperlipidemia Father   . Hypertension Father   . Heart attack Father    Scheduled Meds: . amLODipine  10 mg Oral Daily  . aspirin EC  81 mg Oral Daily  . atorvastatin  20 mg Oral q1800  . cholecalciferol  400 Units Oral QHS  . clopidogrel  75 mg Oral Daily  . docusate sodium  100 mg Oral BID  . ferrous gluconate  324 mg Oral Q breakfast  . furosemide  40 mg Intravenous Q12H  . hydrALAZINE  50 mg Oral TID  . insulin aspart  0-9 Units Subcutaneous TID WC  . insulin glargine  10 Units Subcutaneous Daily  . isosorbide mononitrate  120 mg Oral Daily  . magnesium oxide  400 mg Oral QPM  . metoprolol tartrate  50 mg Oral BID  .  morphine injection  2 mg Intravenous Once  . pantoprazole  40 mg Oral Q1200  . ranolazine  500 mg Oral BID   Continuous Infusions: . nitroGLYCERIN Stopped (07/05/17 2111)   PRN Meds:.acetaminophen **OR** acetaminophen, albuterol, guaiFENesin-dextromethorphan, morphine injection, nitroGLYCERIN, ondansetron **OR** ondansetron (ZOFRAN) IV, oxyCODONE, traMADol Medications Prior to Admission:  Prior to Admission medications   Medication Sig Start Date End Date Taking? Authorizing Provider  acetaminophen (TYLENOL) 500 MG tablet Take 500 mg by mouth every 8 (eight) hours as needed for moderate pain.    Yes [provider]  amLODipine (NORVASC) 10 MG tablet Take 10 mg by mouth every morning.     Yes [provider]  aspirin EC 81 MG EC tablet Take 1 tablet (81 mg total) by mouth daily. 11/19/14  Yes Wellington Hampshire, MD  atorvastatin (LIPITOR) 20 MG tablet Take 20 mg by mouth daily at 6 PM.    Yes [provider]  cholecalciferol (VITAMIN D) 400 units TABS tablet Take 400 Units by mouth at bedtime.    Yes [provider]  Ferrous Gluconate (IRON 27 PO) Take 1 tablet by mouth every evening.   Yes [provider]  GLIPIZIDE XL 10 MG 24 hr tablet Take 10 mg by mouth 2 (two) times daily. 12/09/15  Yes [provider]  hydrALAZINE (APRESOLINE) 50 MG tablet TAKE 1 TABLET BY MOUTH THREE TIMES DAILY AS NEEDED(WILL ONLY TAKE WHEN BLOOD PRESSURE IS RUNNING HIGH. WILL AT LEAST TAKE ONCE DAILY). 05/16/17  Yes Belva Crome, MD  isosorbide mononitrate (IMDUR) 120 MG 24 hr tablet Take 1 tablet (120 mg total) by mouth daily. 01/31/17  Yes Belva Crome, MD  LANTUS SOLOSTAR 100 UNIT/ML Solostar Pen Inject 10 Units into the skin daily.  04/01/17  Yes [provider]  magnesium oxide (MAG-OX) 400 (241.3 Mg) MG tablet Take 400 mg by mouth every evening.    Yes [provider]  metoprolol tartrate (LOPRESSOR) 100 MG tablet Take 0.5 tablets (50 mg total) by mouth 2 (two) times daily. 03/22/17  Yes Wellington Hampshire, MD  nitroGLYCERIN (NITROSTAT) 0.4 MG SL tablet PLACE 1 TABLET UNDER THE TONGUE EVERY 5 MINUTES AS NEEDED FOR CHEST PAIN 08/27/16  Yes Belva Crome, MD  pantoprazole (PROTONIX) 40 MG tablet Take 1 tablet (40 mg total) by mouth daily. Patient taking differently: Take 40 mg by mouth every other day.  03/07/17  Yes Belva Crome, MD  torsemide (DEMADEX) 20 MG tablet TAKE 2 TABLETS BY MOUTH EVERY DAY 03/21/17  Yes Belva Crome, MD  traMADol (ULTRAM) 50 MG tablet TK 1 T PO Q 6 H FOR 5 DAYS PRF PAIN 06/22/17  Yes [provider]  clopidogrel (PLAVIX) 75 MG tablet TAKE 1 TABLET BY MOUTH DAILY 02/23/17   Belva Crome, MD    HYDROcodone-acetaminophen [facility]) 10-325 MG tablet Take 1-2 tablets by mouth every 4 (four) hours as needed for moderate pain. Maximum dose per 24 hours - 8 pills Patient not taking: Reported on 07/01/2017 04/25/17   Kathie Rhodes, MD  phenazopyridine (PYRIDIUM) 200 MG tablet Take 1 tablet (200 mg total) by mouth 3 (three) times daily as needed for pain. Patient not taking: Reported on 07/01/2017 04/25/17   Kathie Rhodes, MD  prochlorperazine (COMPAZINE) 10 MG tablet Take 1 tablet (10 mg total) by mouth every 6 (six) hours as needed for nausea or vomiting. 06/29/17   Wyatt Portela, MD   Allergies  Allergen Reactions  . Other Itching and Other (See Comments)    The iv dye used in Fundus Flourescein Angiography procedure  Kidney issues  . Penicillins Hives    Augmentin Has patient had a PCN reaction causing immediate rash, facial/tongue/throat swelling, SOB or lightheadedness with hypotension: No Has patient had a PCN reaction causing severe rash involving mucus membranes or skin necrosis: No Has patient had a PCN reaction that required hospitalization: Yes Has patient had a PCN reaction occurring within the last 10 years: Yes If all of the above answers are "NO", then may proceed with Cephalosporin use.    Review of Systems  Constitutional: Positive for fatigue.       Generalized intermittent abdominal pain  Neurological: Positive for weakness.    Physical Exam  Constitutional: He appears ill.  Cardiovascular: Normal rate, regular rhythm and normal heart sounds.   Pulmonary/Chest: He has decreased breath sounds in the right lower field and the left lower field.  Neurological: He is alert.  Skin: Skin is warm and dry.    Vital Signs: BP (!) 160/66   Pulse 74   Temp 98.1 F (36.7 C) (Oral)   Resp 18   Ht 5\' 6"  (1.676 m)   Wt 71.5 kg (157 lb 11.2 oz)   SpO2 93%   BMI 25.45 kg/m  Pain Assessment: No/denies pain POSS *See Group Information*: 1-Acceptable,Awake and alert Pain  Score: 0-No pain   SpO2: SpO2: 93 % O2 Device:SpO2: 93 % O2 Flow Rate: .O2 Flow Rate (L/min): 2 L/min  IO: Intake/output summary:  Intake/Output Summary (Last 24 hours) at 07/06/17 1424 Last data filed at 07/06/17 0246  Gross per 24 hour  Intake            654.6 ml  Output             1925 ml  Net          -1270.4 ml    LBM: Last BM Date: 07/04/17 Baseline Weight: Weight: 73.5 kg (162 lb) Most recent weight: Weight: 71.5 kg (157 lb 11.2 oz)     Palliative Assessment/Data: 30% at best   Discussed with Dr Thereasa Solo  Time In: 1300 Time Out: 1415 Time Total: 75 min Greater than 50%  of this time was spent counseling and coordinating care related to the above assessment and plan.  Signed by: Wadie Lessen, NP   Please contact Palliative Medicine Team phone at [phone] for questions and concerns.  For individual provider: See Shea Evans

## 2017-07-06 NOTE — Consult Note (Signed)
[facility] CM Primary Care Navigator  07/06/2017  [name] 08-01-1941 [id number]   Met withpatient and wife [name]) at the bedside to identify possibledischarge needs. Patient reports "having blood clots in urine" and episode of chest pain that hadled to this admission.  Patient endorses Dr. Shirline Frees with [facility] as the primary care provider.   [facility] [address] [facility].  Patient manages his medications at home with wife's assistance straight out of the containers. Wife mentioned use of "pill box" when they are travelling.  He states that wife provides transportation to hisdoctors' appointments.  Patient mentioned that wife will be his primarycaregiver at home once discharged.  Discharge plan is to be determined per MD note. Disposition for discharge is pending therapy recommendations, palliative consult and physician order. Patient and wife are hopeful that he will be discharging home when clinically improved.  Patient and wife voiced understanding to call primary care provider's office when he returns back home for a post discharge follow-up appointment within a week or sooner if needs arise.Patient letter (with PCP's contact number) wasprovided as areminder.  Explained to patientand wife regardingTHN CM services available for further healthmanagement.He reports being able to manage chronic health issues at home so far. Patient reports that [facility] ([employer]) has provided him a tele-monitor weighing scale at home and calls for follow-up to monitor HF. He mentioned that he has "rescue inhaler" ordered for COPD but has not really used it. Patient's diabetes is monitored by primary care provider every 3 months and wife is helping him manage his diet and medications at home.  Patient and wife are aware and expressedunderstanding to  seekreferral to [facility] care managementservicesfrom primary care provider ifdeemed necessary andappropriatefor services in the near future.   [facility] care management information provided for any future needs that he may have.  Patient verbalized interest to only opt and verbally agree to [facility] follow-up calls to monitor hisrecovery at home.  Referral made for [facility] General calls after discharge.   For questions, please contact:  Dannielle Huh, BSN, RN- [facility] Primary Care Navigator  Telephone: [phone] [place]

## 2017-07-07 DIAGNOSIS — I10 Essential (primary) hypertension: Secondary | ICD-10-CM

## 2017-07-07 DIAGNOSIS — D649 Anemia, unspecified: Secondary | ICD-10-CM

## 2017-07-07 DIAGNOSIS — E1165 Type 2 diabetes mellitus with hyperglycemia: Secondary | ICD-10-CM

## 2017-07-07 DIAGNOSIS — N390 Urinary tract infection, site not specified: Secondary | ICD-10-CM

## 2017-07-07 DIAGNOSIS — E1121 Type 2 diabetes mellitus with diabetic nephropathy: Secondary | ICD-10-CM

## 2017-07-07 LAB — BASIC METABOLIC PANEL
ANION GAP: 9 (ref 5–15)
BUN: 41 mg/dL — AB (ref 6–20)
CO2: 25 mmol/L (ref 22–32)
Calcium: 8.6 mg/dL — ABNORMAL LOW (ref 8.9–10.3)
Chloride: 101 mmol/L (ref 101–111)
Creatinine, Ser: 2.26 mg/dL — ABNORMAL HIGH (ref 0.61–1.24)
GFR, EST AFRICAN AMERICAN: 31 mL/min — AB (ref >60)
GFR, EST NON AFRICAN AMERICAN: 27 mL/min — AB (ref >60)
Glucose, Bld: 169 mg/dL — ABNORMAL HIGH (ref 65–99)
POTASSIUM: 4.2 mmol/L (ref 3.5–5.1)
SODIUM: 135 mmol/L (ref 135–145)

## 2017-07-07 LAB — CBC
HCT: 27.2 % — ABNORMAL LOW (ref 39.0–52.0)
HEMOGLOBIN: 9 g/dL — AB (ref 13.0–17.0)
MCH: 26.9 pg (ref 26.0–34.0)
MCHC: 33.1 g/dL (ref 30.0–36.0)
MCV: 81.4 fL (ref 78.0–100.0)
Platelets: 258 10*3/uL (ref 150–400)
RBC: 3.34 MIL/uL — ABNORMAL LOW (ref 4.22–5.81)
RDW: 14.6 % (ref 11.5–15.5)
WBC: 11.7 10*3/uL — ABNORMAL HIGH (ref 4.0–10.5)

## 2017-07-07 LAB — GLUCOSE, CAPILLARY
GLUCOSE-CAPILLARY: 185 mg/dL — AB (ref 65–99)
Glucose-Capillary: 157 mg/dL — ABNORMAL HIGH (ref 65–99)

## 2017-07-07 LAB — MAGNESIUM: MAGNESIUM: 1.8 mg/dL (ref 1.7–2.4)

## 2017-07-07 NOTE — Care Management Note (Signed)
Case Management Note Marvetta Gibbons RN, BSN Unit 4E-Case Manager [phone]  Patient Details  Name: [name] MRN: [id number] Date of Birth: 17-Sep-1941  Subjective/Objective:   Pt admitted with hematuria - hx of self caths at home- ARF, also had episode of chest pain and EKG changes                 Action/Plan: PTA pt lived at home with wife- independent- CM to follow for d/c needs.   Expected Discharge Date:    07/07/17              Expected Discharge Plan:  Home w Hospice Care  In-House Referral:  NA  Discharge planning Services  CM Consult  Post Acute Care Choice:  Hospice Choice offered to:  Patient, Spouse  DME Arranged:  N/A DME Agency:  NA  HH Arranged:  Disease Management [place] Agency:  [place]  Status of Service:  Completed, signed off  If discussed at [place] of Stay Meetings, dates discussed:  9/20  Discharge Disposition: home with home hospice   Additional Comments:  07/07/17- 1600- Marvetta Gibbons RN, CM- referral received for Home Hospice choice- spoke with pt and wife at bedside- confirmed that pt wants to return home with home hospice services- choice offered for hospice agencies in [facility]- per pt and wife they would like to use [facility]- discussed DME needs- pt at this time does not want a hospital bed, no 02 needs- already has RW, cane, wheelchair at home and does not know of any DME needs at this time- wife plans to transport home via private car. Pt and wife both state that they do not feel like Hospice will need to make visit on day of discharge and are fine with Hospice doing intake the next day. Referral called to [place] [facility]- who will contact pt and wife for home hospice services- wife's cell # is [phone].  Pt to d/c home today vs tomorrow- pending MD to round on pt today.   [name], RN 07/07/2017, 4:06 PM

## 2017-07-07 NOTE — Consult Note (Signed)
Urology Consult Note   Requesting Attending Physician:  Allie Bossier, MD Service Providing Consult: Urology  Consulting Attending: Dr. Junious Silk   Reason for Consult:  Gross hematuria  HPI: [name] is seen in consultation at the request of Allie Bossier, MD for evaluation of gross hematuria.  This is a 76 y.o. 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 His hematuria gegan the day prior to admission after cathing, unsure if catheter trauma related. [place] progressed and he was unable to cath. Presented to ED, where a 16Fr Foley was placed, and clots flushed from his bladder.   Through the course of his admission, he has had continued gross hematuria, requiring intermittent irrigation to remove clots, including last night.   He was originally scheduled to receive chemotherapy with the possibility of cystectomy were he to respond, however after discussion with medical oncology and palliative care, he will be discharged to home hospice given his poor candidacy for chemo or surgery.   Of note, he underwent a TURP on 04/25/17 for the purposes of easy CIC. The R UO was unable to be visualized at that time, and so an antegrade stent was placed by IR on 06/07/17. Cr currently at his baseline ~2.0.  Otherwise doing well recently. No infectious symptoms.    Past Medical History: Past Medical History:  Diagnosis Date  . Anemia    a. + FOBT 08/2014.  Marland Kitchen Anginal pain ([place])    comes and goes; uses nitro to relieve   . Arthritis   . Atherosclerosis of native arteries of the extremities with intermittent claudication    a. L external iliac stent 2001, bilateral SFA occlusions documented 2001.  . Atrial fibrillation ([place])   . Bladder cancer ([place])   . CAD  (coronary artery disease), native coronary artery 09/24/2013   a. CABG x 6 1999. b. Interim PCIs: Angioplasty SVG-PDA 2006.Taxus DES to native LCx in 2007.DES to SVG-PDA 2007, restented 2009.NSTEMI with PCI to SVG- PDA complicated by no-reflow/inferior infarction in 2012. c. Redo CABG 2015 with SVG-PD, SVG-D1, SVG-OM1.  . Carotid artery occlusion    a. Duplex 12/2013: mild plaque bilat, 40-59% BICA. F/u due 12/2014.  Marland Kitchen Chronic combined systolic and diastolic CHF (congestive heart failure) ([place])    a. Prior EF 45-50% in 08/2014. b. Improved EF >55% in 09/2014 at [facility].  . Chronic lower back pain   . CKD (chronic kidney disease) stage 3, GFR 30-59 ml/min   . COPD (chronic obstructive pulmonary disease) ([place])    pt states he doesn't have  . Daily headache    comes and goes pt denies daily currently   . Dysrhythmia   . Esophageal stricture    a. History of dilitation - Long-standing history of esophageal reflux   . Gait instability   . GERD (gastroesophageal reflux disease)   . Gout   . History of bronchitis   . History of gout   . History of hiatal hernia   . Hx of CABG 09/24/2013   a. 1999: LIMA to LAD, sequential SVG to diagonal 2 and diagonal 3, sequential SVG to PDA, acute marginal branch, and PL branch. b. Repeat CABG 2015 with SVG to D1, SVG to PDA, and SVG to OM.   Marland Kitchen Hyperlipidemia   . Hypertension   .  Hypotonic bladder    Requiring in and out catheterization performed by the patient at home - occasional blood tinged urine (self-cath's daily for the last 2 years) and hematuria is chronic for him, followed by urology.  . MI (myocardial infarction) ([place]) 1999-02/2014   total of 7 MI per patient  . OSA (obstructive sleep apnea)    "don't wear mask"   . Polymyalgia rheumatica ([place])   . Self-catheterizes urinary bladder   . Shortness of breath dyspnea   . Skin cancer   . SVT (supraventricular tachycardia) ([place])   . Type II diabetes mellitus ([place])     Past Surgical History:  Past  Surgical History:  Procedure Laterality Date  . ABDOMINAL AORTAGRAM N/A 11/27/2014   Procedure: ABDOMINAL Maxcine Ham;  Surgeon: Wellington Hampshire, MD;  Location: [facility] CATH LAB;  Service: Cardiovascular;  Laterality: N/A;  . ANKLE FRACTURE SURGERY Right ~ 1977   "10 plates and 7 screws placed in it"  . ANTERIOR CERVICAL DECOMP/DISCECTOMY FUSION    . BACK SURGERY     neck surgery x 2  . CARDIAC CATHETERIZATION    . CATARACT EXTRACTION W/ INTRAOCULAR LENS  IMPLANT, BILATERAL Bilateral 2000's  . COLONOSCOPY    . CORONARY ARTERY BYPASS GRAFT  1999  . CORONARY ARTERY BYPASS GRAFT N/A 03/01/2014   Procedure: REDO CORONARY ARTERY BYPASS GRAFTING TIMES THREE, USING RIGHT GREATER SAPHENOUS VEIN VIA ENDOVEIN HARVEST.;  Surgeon: Gaye Pollack, MD;  Location: [facility] OR;  Service: Open Heart Surgery;  Laterality: N/A;  . CORONARY STENT PLACEMENT     "I've had 8 put in; I presently have none" (08/26/2014)  . CYSTOSCOPY W/ RETROGRADES Bilateral 03/03/2015   Procedure: CYSTOSCOPY WITH LEFT RETROGRADE PYELOGRAM;  Surgeon: Kathie Rhodes, MD;  Location: [facility] ORS;  Service: Urology;  Laterality: Bilateral;  . CYSTOSCOPY WITH BIOPSY N/A 04/25/2017   Procedure: CYSTOSCOPY WITH BLADDER BIOPSY;  Surgeon: Kathie Rhodes, MD;  Location: [facility] ORS;  Service: Urology;  Laterality: N/A;  . ENDARTERECTOMY Left 01/30/2015   Procedure: ENDARTERECTOMY CAROTID WITH PRIMARY CLOSURE OF ARTERY;  Surgeon: Angelia Mould, MD;  Location: [facility] OR;  Service: Vascular;  Laterality: Left;  . ESOPHAGOGASTRODUODENOSCOPY (EGD) WITH ESOPHAGEAL DILATION  X 1  . EYE SURGERY     cataract surgery bilat   . FRACTURE SURGERY Right    ankle  . INTRAOPERATIVE TRANSESOPHAGEAL ECHOCARDIOGRAM N/A 03/01/2014   Procedure: INTRAOPERATIVE TRANSESOPHAGEAL ECHOCARDIOGRAM;  Surgeon: Gaye Pollack, MD;  Location: [facility] OR;  Service: Open Heart Surgery;  Laterality: N/A;  . IR NEPHROSTOMY PLACEMENT RIGHT  05/11/2017  . IR URETERAL STENT PLACEMENT EXISTING ACCESS RIGHT   05/25/2017  . LEFT HEART CATHETERIZATION WITH CORONARY/GRAFT ANGIOGRAM  02/26/2014   Procedure: LEFT HEART CATHETERIZATION WITH Beatrix Fetters;  Surgeon: Sinclair Grooms, MD;  Location: [facility] CATH LAB;  Service: Cardiovascular;;  . LEFT HEART CATHETERIZATION WITH CORONARY/GRAFT ANGIOGRAM N/A 07/23/2014   Procedure: LEFT HEART CATHETERIZATION WITH Beatrix Fetters;  Surgeon: Sinclair Grooms, MD;  Location: [facility] CATH LAB;  Service: Cardiovascular;  Laterality: N/A;  . LEFT HEART CATHETERIZATION WITH CORONARY/GRAFT ANGIOGRAM N/A 08/30/2014   Procedure: LEFT HEART CATHETERIZATION WITH Beatrix Fetters;  Surgeon: Leonie Man, MD;  Location: [facility] CATH LAB;  Service: Cardiovascular;  Laterality: N/A;  . MOHS SURGERY  X 2   "nose; nose"  . MULTIPLE TOOTH EXTRACTIONS    . SKIN CANCER EXCISION     "all over my head; ears; back  . TONSILLECTOMY    . TRANSURETHRAL RESECTION  OF BLADDER TUMOR WITH GYRUS (TURBT-GYRUS) N/A 03/03/2015   Procedure: TRANSURETHRAL RESECTION OF BLADDER TUMOR;  Surgeon: Kathie Rhodes, MD;  [facility];  Service: Urology;  Laterality: N/A;  . TRANSURETHRAL RESECTION OF BLADDER TUMOR WITH GYRUS (TURBT-GYRUS) N/A 04/28/2015   Procedure: TRANSURETHRAL RESECTION OF BLADDER TUMOR ;  Surgeon: Kathie Rhodes, MD;  [facility];  Service: Urology;  Laterality: N/A;  . TRANSURETHRAL RESECTION OF BLADDER TUMOR WITH GYRUS (TURBT-GYRUS) N/A 11/24/2015   Procedure: TRANSURETHRAL RESECTION OF BLADDER TUMOR WITH GYRUS (TURBT-GYRUS);  Surgeon: Kathie Rhodes, MD;  [facility];  Service: Urology;  Laterality: N/A;  . TRANSURETHRAL RESECTION OF PROSTATE N/A 04/25/2017   Procedure: TRANSURETHRAL RESECTION OF THE PROSTATE (TURP);  Surgeon: Kathie Rhodes, MD;  [facility];  Service: Urology;  Laterality: N/A;    Medication: Current Facility-Administered Medications  Medication Dose Route Frequency Provider Last Rate Last Dose  . acetaminophen (TYLENOL) tablet 650 mg   650 mg Oral Q6H PRN Cherene Altes, MD   650 mg at 07/06/17 2305   Or  . acetaminophen (TYLENOL) suppository 650 mg  650 mg Rectal Q6H PRN Joette Catching T, MD      . albuterol (PROVENTIL) (2.5 MG/3ML) 0.083% nebulizer solution 2.5 mg  2.5 mg Nebulization Q2H PRN Cherene Altes, MD   2.5 mg at 07/04/17 1206  . amLODipine (NORVASC) tablet 10 mg  10 mg Oral Daily Regalado, Belkys A, MD   10 mg at 07/07/17 0955  . aspirin EC tablet 81 mg  81 mg Oral Daily Regalado, Belkys A, MD   81 mg at 07/07/17 0953  . atorvastatin (LIPITOR) tablet 20 mg  20 mg Oral q1800 Regalado, Belkys A, MD   20 mg at 07/06/17 1803  . cholecalciferol (VITAMIN D) tablet 400 Units  400 Units Oral QHS Regalado, Belkys A, MD   400 Units at 07/06/17 2308  . clopidogrel (PLAVIX) tablet 75 mg  75 mg Oral Daily Regalado, Belkys A, MD   75 mg at 07/07/17 0953  . docusate sodium (COLACE) capsule 100 mg  100 mg Oral BID Regalado, Belkys A, MD   100 mg at 07/07/17 0953  . ferrous gluconate (FERGON) tablet 324 mg  324 mg Oral Q breakfast Regalado, Belkys A, MD   324 mg at 07/07/17 0825  . guaiFENesin-dextromethorphan (ROBITUSSIN DM) 100-10 MG/5ML syrup 5 mL  5 mL Oral Q4H PRN Regalado, Belkys A, MD   5 mL at 07/04/17 0324  . hydrALAZINE (APRESOLINE) tablet 50 mg  50 mg Oral TID Arnoldo Lenis, MD   50 mg at 07/07/17 0954  . insulin aspart (novoLOG) injection 0-9 Units  0-9 Units Subcutaneous TID WC Regalado, Belkys A, MD   2 Units at 07/07/17 1249  . insulin glargine (LANTUS) injection 14 Units  14 Units Subcutaneous Daily Cherene Altes, MD   14 Units at 07/07/17 [id number]  . isosorbide mononitrate (IMDUR) 24 hr tablet 120 mg  120 mg Oral Daily Regalado, Belkys A, MD   120 mg at 07/07/17 0953  . magnesium oxide (MAG-OX) tablet 400 mg  400 mg Oral QPM Regalado, Belkys A, MD   400 mg at 07/06/17 1806  . metoprolol tartrate (LOPRESSOR) tablet 50 mg  50 mg Oral BID Regalado, Belkys A, MD   50 mg at 07/07/17 0955  . morphine 2  MG/ML injection 1-2 mg  1-2 mg Intravenous Q1H PRN Cherene Altes, MD      . nitroGLYCERIN (NITROSTAT) SL tablet 0.4 mg  0.4 mg Sublingual Q5 min PRN Regalado, Belkys A, MD   0.4 mg at 07/03/17 1809  . ondansetron (ZOFRAN) tablet 4 mg  4 mg Oral Q6H PRN Regalado, Belkys A, MD       Or  . ondansetron (ZOFRAN) injection 4 mg  4 mg Intravenous Q6H PRN Regalado, Belkys A, MD      . oxyCODONE (Oxy IR/ROXICODONE) immediate release tablet 5-10 mg  5-10 mg Oral Q4H PRN Cherene Altes, MD      . pantoprazole (PROTONIX) EC tablet 40 mg  40 mg Oral Q1200 Cherene Altes, MD   40 mg at 07/07/17 1248  . ranolazine (RANEXA) 12 hr tablet 500 mg  500 mg Oral BID Ledora Bottcher, PA   500 mg at 07/07/17 1937  . torsemide (DEMADEX) tablet 40 mg  40 mg Oral Daily Cherene Altes, MD   40 mg at 07/07/17 0953  . traMADol (ULTRAM) tablet 50 mg  50 mg Oral Q6H PRN Regalado, Belkys A, MD        Allergies: Allergies  Allergen Reactions  . Other Itching and Other (See Comments)    The iv dye used in Fundus Flourescein Angiography procedure  Kidney issues  . Penicillins Hives    Augmentin Has patient had a PCN reaction causing immediate rash, facial/tongue/throat swelling, SOB or lightheadedness with hypotension: No Has patient had a PCN reaction causing severe rash involving mucus membranes or skin necrosis: No Has patient had a PCN reaction that required hospitalization: Yes Has patient had a PCN reaction occurring within the last 10 years: Yes If all of the above answers are "NO", then may proceed with Cephalosporin use.     Social History: Social History  Substance Use Topics  . Smoking status: Former Smoker    Packs/day: 2.00    Years: 50.00    Types: Cigarettes    Quit date: 09/18/2015  . Smokeless tobacco: Never Used  . 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 CAD  Mother   . Heart disease Mother   . Hypertension Mother   . Cirrhosis Brother   . Heart disease Father   . Hyperlipidemia Father   . Hypertension Father   . Heart attack Father     Review of Systems 10 systems were reviewed and are negative except as noted specifically in the HPI.  Objective   Vital signs in last 24 hours: BP (!) 139/58 (BP Location: Left Arm)   Pulse 71   Temp (!) 97.4 F (36.3 C) (Oral)   Resp 20   Ht '5\' 6"'$  (1.676 m)   Wt 70.4 kg (155 lb 4.8 oz)   SpO2 95%   BMI 25.07 kg/m    Physical Exam General: NAD, A&O, resting, appropriate HEENT: [place]/AT, EOMI, MMM Pulmonary: Normal work of breathing Cardiovascular: HDS, adequate peripheral perfusion Abdomen: Soft, NTTP, nondistended. GU: 16Fr Foley draining clear pink urine without clot, no CVA tenderness Extremities: warm and well perfused Neuro: Appropriate, no focal neurological deficits  Most Recent Labs: Lab Results  Component Value Date   WBC 11.7 (H) 07/07/2017   HGB 9.0 (L) 07/07/2017   HCT 27.2 (L) 07/07/2017   PLT 258 07/07/2017    Lab Results  Component Value Date   NA 135 07/07/2017   K 4.2 07/07/2017   CL 101 07/07/2017  CO2 25 07/07/2017   BUN 41 (H) 07/07/2017   CREATININE 2.26 (H) 07/07/2017   CALCIUM 8.6 (L) 07/07/2017   MG 1.8 07/07/2017    Lab Results  Component Value Date   INR 0.96 05/25/2017   APTT 27 05/25/2017     IMAGING: No results found.  ------  Assessment:  76 y.o. male with CAD, Afib, CKD, and locally metastatic bladder cancer as well as voiding dysfunction on CIC who is admitted for clot retention and work-up of chest pain.   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 Karsten Ro. Lesion centered Rt trigone at area of diverticulum close to Rt UO. Prostate stromal invasion also present. PET-CT 05/2017 showed mets to regional nodes. Met with medical oncology  Dr. Alen Blew who originally recommended chemotherapy with the possibility of cystectomy, but given his recent decline in functional status, he is not an ideal candidate for either chemo or surgery.   He has been seen by palliative care, and it appears he and his family have chosen home hospice, which I believe is a reasonable decision at this point.    Recommendations: - Currently, his Foley is in place, draining clear cherry urine without clot. Has needed irrigation from time to time for clots, and this is likely to continue. Therefore, would leave foley in place to drain through discharge, and would have him return to CIC were his urine to clear significantly. If his urine does clear at home, please instruct him how to remove his catheter at home. Can continue regular cath regimen therafter.  - Flush Foley with 200-300cc NS PRN for clots.  - Urology will continue to be available prn. Please page with any questions or concerns.

## 2017-07-07 NOTE — Progress Notes (Signed)
Patient ID: [name], male   DOB: 1940-12-09, 76 y.o.   MRN: [id number]  This NP visited patient at the bedside as a follow up to  yesterday's [place], wife at bedside  Patient report that he "had a bad night".  Foley became blocked with blood/clots, and required to be aggressively irrigated.  Wife is requesting a visit from both urology and oncology.  This NP placed call to Dr Hazeline Junker office and [facility].    Continued discussion regarding diagnosis,  prognosis, goals of care, end of life wishes, and anticipatory care needs.  I spoke with Dr. Alen Blew directly and he verbalized his worry that in the patients current medical situation he will not be able to offer her systemic chemotherapy.  Dr. Alen Blew  will come see the patient soon as he is able.  As the window of opportunity get smaller for viable systemic treatment option the patients prognosis becomes more grim.  Systemic chemotherapy would only be an option if the patient improves.  I discussed with the patient directly about his wishes.  He was able to tell me "I'm not really ready to die, I'm not afraid to die either ", "I wish I had more time with [name]" however he is pragmatic and understands the situation and is open to utilization of his hospice benefit at home.  I spoke to [name] the patient's wife and she too agrees that home hospice to be a good thing at this point in time for both patient and family. I will write to offer choice.  Discussed with patient the importance of continued conversation with family and their  medical providers regarding overall plan of care and treatment options,  ensuring decisions are within the context of the patients values and GOCs.  Questions and concerns addressed  Time in    1345       Time out  1430  Total time spent on the unit was 45 min Greater than 50% of the time was spent in counseling and coordination of care  Discussed with [place]ita [place] case Physiological scientist.  Wadie Lessen NP   Palliative Medicine Team Team Phone # [phone] Pager [phone]

## 2017-07-07 NOTE — Progress Notes (Addendum)
PROGRESS NOTE    [name]  ZOX:[id number] DOB: 09-Mar-1941 DOA: 07/01/2017 PCP: Shirline Frees, MD   Brief Narrative:  76 y.o. 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 The day prior to admission he started to have difficulty with catheter. The catheter got clotted with blood. He report gross hematuria and clots. He denies fever, chills. He has an episode of chest pain after he received a medication in the ED> '' Bicarb amp" . He received nitroglycerin. He is currently chest pain free. He had some ST changes in the EKG> case was reviewed with cardiology on call, who will see patient in consultation at [facility] long. The pt was transferred to [facility] for Cardiology evaluation    Subjective: 9/20  A/O 4, negative CP, negative SOB, negative N/V, negative abdominal pain. States spoke with Dr. Alen Blew oncology who stated no longer appropriate for chemotherapy. Did offer palliative radiation.       Assessment & Plan:   Active Problems:   CKD (chronic kidney disease) stage 3, GFR 30-59 ml/min   Chronic diastolic heart failure (HCC)   HTN (hypertension)   Anemia   Renal failure   AKI (acute kidney injury) ([place])   Hyperkalemia   Troponin level elevated   DNR (do not resuscitate) discussion   Palliative care by specialist  Gross hematuria with obstructive uropathy  -Most likely secondary to metastatic bladder cancer. -Renal ultrasound noted mild left hydronephrosis similar recent PET/CT -Continue Foley for 7 days per urology: Flush Foley with 200-300 mL normal saline PRN clots -Patient continues to have frank hematuria. Urology recommends continuing Foley through discharge, ensure patient and wife are comfortable with bladder irrigation to clear blood clots. -Schedule follow-up appointment in 1 week with Dr. Festus Aloe Urology obstructive uropathy secondary to metastatic bladder cancer  UTI? -Completed course antibiotics    Metastatic Bladder Cancer -9/20 Dr. Zola Button Oncology evaluated patient and informed him and wife the likelihood of benefit from chemotherapy severely diminished. Would most likely increased benefit from radiation treatment. -Schedule follow-up Dr. Zola Button Oncology in one week to discuss radiation treatment for metastatic bladder cancer.     Normocytic anemia   Recent Labs Lab 07/02/17 1850 07/03/17 1156 07/04/17 0301 07/05/17 0333 07/06/17 0255 07/07/17 0335  HGB 9.2* 10.6* 9.2* 8.3* 9.2* 9.0*  -Stabilized  -Occult blood: Never obtained -Most likely combination of metastatic cancer, hematuria. For completeness obtain anemia panel   Acute Renal Failure on CKD Stage 3(Baseline Cr 1.8-2.2)    Recent Labs Lab 07/02/17 0450 07/03/17 1538 07/04/17 0301 07/05/17 0333 07/06/17 0255 07/07/17 0335  CREATININE 1.79* 1.91* 1.97* 2.22* 2.30* 2.26*  -Stable continue to monitor  Paroxysmal atrial fibrillation  -Currently in NSR -Not on chronic anticoagulation and would not start given patient's hematuria.    Chest pain/CAD -S/P CABG 6 (1999) and redo-CABG 3 (2015) - CAD s/p CABG x6 '99 and redo-CABG x3 '15 -Per cardiology treat conservatively given patient's poor renal function. Patient and wife understand any type of procedure entailing thigh would most likely cause ESRD.    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 Discuss short-term vs long-term goals care: Change CODE STATUS DNR: Hospice -9/20 patient and wife have selected [facility] at time of discharge ensure, all contact information updated.    DVT prophylaxis: SCD Code Status: Full Family Communication: Wife at bedside for discussion of plan care Disposition Plan: TBD   Consultants:  Urology Cardiology    Procedures/Significant Events:  None   I have personally reviewed and interpreted all radiology studies and my findings are as above.  VENTILATOR SETTINGS: None   Cultures 9/14 urine positive multiple species   Antimicrobials: Anti-infectives    Start     Stop   07/02/17 1200  cefTRIAXone (ROCEPHIN) 1 g in dextrose 5 % 50 mL IVPB     07/07/17 1159   07/01/17 1300  cefTRIAXone (ROCEPHIN) 1 g in dextrose 5 % 50 mL IVPB     07/01/17 1405       Devices    LINES / TUBES:      Continuous Infusions:    Objective: Vitals:   07/07/17 0440 07/07/17 0954 07/07/17 0955 07/07/17 1524  BP: (!) 153/58 (!) 163/65  (!) 139/58  Pulse: 62  72 71  Resp: 16   20  Temp: 98.6 F (37 C)   (!) 97.4 F (36.3 C)  TempSrc: Oral   Oral  SpO2: 96%   95%  Weight: 155 lb 4.8 oz (70.4 kg)     Height:        Intake/Output Summary (Last 24 hours) at 07/07/17 2020 Last data filed at 07/07/17 1700  Gross per 24 hour  Intake              250 ml  Output              620 ml  Net             -370 ml   Filed Weights   07/05/17 0624 07/06/17 0318 07/07/17 0440  Weight: 158 lb 8 oz (71.9 kg) 157 lb 11.2 oz (71.5 kg) 155 lb 4.8 oz (70.4 kg)   Physical Exam:  General: A/O 4, No acute respiratory distress Neck:  Negative scars, masses, torticollis, lymphadenopathy, JVD Lungs: Clear to auscultation bilaterally without wheezes or crackles Cardiovascular: Regular rate and rhythm without murmur gallop or  rub normal S1 and S2 Abdomen: Negative suprapubic abdominal pain, positive mild LLQ abdominal pain to palpation, positive distention, positive soft, bowel sounds, no rebound, no ascites, no appreciable mass Extremities: No significant cyanosis, clubbing, or edema bilateral lower extremities Skin: Negative rashes, lesions, ulcers Central nervous system:  Cranial nerves II through XII intact, tongue/uvula midline, all extremities muscle strength 5/5, sensation intact throughout, negative dysarthria, negative expressive aphasia, negative receptive aphasia.     Data Reviewed: Care during the described time interval was provided by me .  I have reviewed this patient's available data, including medical history, events of note, physical examination, and all test results as part of my evaluation.   CBC:  Recent Labs Lab 07/03/17 1156 07/04/17 0301 07/05/17 0333 07/06/17 0255 07/07/17 0335  WBC 16.2* 15.8* 12.5* 12.9* 11.7*  HGB 10.6* 9.2* 8.3* 9.2* 9.0*  HCT 32.3*  28.2* 25.5* 28.2* 27.2*  MCV 83.2 82.5 82.3 81.5 81.4  PLT 261 252 220 252 785   Basic Metabolic Panel:  Recent Labs Lab 07/03/17 1538 07/04/17 0301 07/05/17 0333 07/06/17 0255 07/07/17 0335  NA 134* 136 135 136 135  K 5.4* 4.9 4.4 4.8 4.2  CL 106 106 104 99* 101  CO2 20* 24 25 28 25   GLUCOSE 195* 171* 187* 186* 169*  BUN 31* 31* 37* 39* 41*  CREATININE 1.91* 1.97* 2.22* 2.30* 2.26*  CALCIUM 8.5* 8.6* 8.5* 9.0 8.6*  MG  --   --   --  1.8 1.8   GFR: Estimated Creatinine Clearance: 25.5 mL/min (A) (by C-G formula based on SCr of 2.26 mg/dL (H)). Liver Function Tests:  Recent Labs Lab 07/04/17 0301  AST 17  ALT 25  ALKPHOS 74  BILITOT 0.3  PROT 5.9*  ALBUMIN 2.5*   No results for input(s): LIPASE, AMYLASE in the last 168 hours. No results for input(s): AMMONIA in the last 168 hours. Coagulation Profile: No results for input(s): INR, PROTIME in the last 168 hours. Cardiac Enzymes:  Recent Labs Lab  07/03/17 1156 07/03/17 2159 07/04/17 0301 07/04/17 1002 07/05/17 0333  TROPONINI 0.67* 0.56* 0.56* 0.53* 0.32*   BNP (last 3 results) No results for input(s): PROBNP in the last 8760 hours. HbA1C: No results for input(s): HGBA1C in the last 72 hours. CBG:  Recent Labs Lab 07/06/17 1129 07/06/17 1654 07/06/17 2137 07/07/17 1122 07/07/17 1639  GLUCAP 196* 177* 159* 185* 157*   Lipid Profile: No results for input(s): CHOL, HDL, LDLCALC, TRIG, CHOLHDL, LDLDIRECT in the last 72 hours. Thyroid Function Tests: No results for input(s): TSH, T4TOTAL, FREET4, T3FREE, THYROIDAB in the last 72 hours. Anemia Panel:  Recent Labs  07/06/17 0255  VITAMINB12 503  FOLATE 9.5  FERRITIN 252  TIBC 227*  IRON 39*  RETICCTPCT 2.3   Urine analysis:    Component Value Date/Time   COLORURINE RED (A) 07/01/2017 1135   APPEARANCEUR TURBID (A) 07/01/2017 1135   LABSPEC  07/01/2017 1135    TEST NOT REPORTED DUE TO COLOR INTERFERENCE OF URINE PIGMENT   PHURINE  07/01/2017 1135    TEST NOT REPORTED DUE TO COLOR INTERFERENCE OF URINE PIGMENT   GLUCOSEU (A) 07/01/2017 1135    TEST NOT REPORTED DUE TO COLOR INTERFERENCE OF URINE PIGMENT   HGBUR (A) 07/01/2017 1135    TEST NOT REPORTED DUE TO COLOR INTERFERENCE OF URINE PIGMENT   BILIRUBINUR (A) 07/01/2017 1135    TEST NOT REPORTED DUE TO COLOR INTERFERENCE OF URINE PIGMENT   KETONESUR (A) 07/01/2017 1135    TEST NOT REPORTED DUE TO COLOR INTERFERENCE OF URINE PIGMENT   PROTEINUR (A) 07/01/2017 1135    TEST NOT REPORTED DUE TO COLOR INTERFERENCE OF URINE PIGMENT   UROBILINOGEN 1.0 03/16/2015 2130   NITRITE (A) 07/01/2017 1135    TEST NOT REPORTED DUE TO COLOR INTERFERENCE OF URINE PIGMENT   LEUKOCYTESUR (A) 07/01/2017 1135    TEST NOT REPORTED DUE TO COLOR INTERFERENCE OF URINE PIGMENT   Sepsis Labs: @LABRCNTIP (procalcitonin:4,lacticidven:4)  ) Recent Results (from the past 240 hour(s))  Urine culture     Status: Abnormal    Collection Time: 07/01/17 11:30 AM  Result Value Ref Range Status   Specimen Description URINE, CATHETERIZED  Final   Special Requests NONE  Final   Culture MULTIPLE SPECIES PRESENT, SUGGEST RECOLLECTION (A)  Final   Report Status 07/02/2017 FINAL  Final  Radiology Studies: No results found.      Scheduled Meds: . amLODipine  10 mg Oral Daily  . aspirin EC  81 mg Oral Daily  . atorvastatin  20 mg Oral q1800  . cholecalciferol  400 Units Oral QHS  . clopidogrel  75 mg Oral Daily  . docusate sodium  100 mg Oral BID  . ferrous gluconate  324 mg Oral Q breakfast  . hydrALAZINE  50 mg Oral TID  . insulin aspart  0-9 Units Subcutaneous TID WC  . insulin glargine  14 Units Subcutaneous Daily  . isosorbide mononitrate  120 mg Oral Daily  . magnesium oxide  400 mg Oral QPM  . metoprolol tartrate  50 mg Oral BID  . pantoprazole  40 mg Oral Q1200  . ranolazine  500 mg Oral BID  . torsemide  40 mg Oral Daily   Continuous Infusions:    LOS: 6 days    Time spent: 40 minutes    WOODS, Geraldo Docker, MD [facility] Pager [phone]   If 7PM-7AM, please contact night-coverage [email or web address] Password [email or web address] 07/07/2017, 8:20 PM

## 2017-07-07 NOTE — Progress Notes (Signed)
Progress Note  Patient Name: [name] Date of Encounter: 07/07/2017  Primary Cardiologist: Tamala Julian   Subjective   76 yo with hx of CAD, CABG Admitted with progressive renal insufficiency, hematura, chest pain  Has metastatic bladder cancer  Had a much better night.  No angina Is off the NTG drip  Still passing blood clots in his urine   Inpatient Medications    Scheduled Meds: . amLODipine  10 mg Oral Daily  . aspirin EC  81 mg Oral Daily  . atorvastatin  20 mg Oral q1800  . cholecalciferol  400 Units Oral QHS  . clopidogrel  75 mg Oral Daily  . docusate sodium  100 mg Oral BID  . ferrous gluconate  324 mg Oral Q breakfast  . hydrALAZINE  50 mg Oral TID  . insulin aspart  0-9 Units Subcutaneous TID WC  . insulin glargine  14 Units Subcutaneous Daily  . isosorbide mononitrate  120 mg Oral Daily  . magnesium oxide  400 mg Oral QPM  . metoprolol tartrate  50 mg Oral BID  . pantoprazole  40 mg Oral Q1200  . ranolazine  500 mg Oral BID  . torsemide  40 mg Oral Daily   Continuous Infusions:  PRN Meds: acetaminophen **OR** acetaminophen, albuterol, guaiFENesin-dextromethorphan, morphine injection, nitroGLYCERIN, ondansetron **OR** ondansetron (ZOFRAN) IV, oxyCODONE, traMADol   Vital Signs    Vitals:   07/06/17 1600 07/06/17 1921 07/06/17 2055 07/07/17 0440  BP: (!) 156/54 (!) 153/56 (!) 164/61 (!) 153/58  Pulse:  73 80 62  Resp:  (!) 22 19 16   Temp:  98.8 F (37.1 C) 98.6 F (37 C) 98.6 F (37 C)  TempSrc:  Oral Oral Oral  SpO2:  97% 94% 96%  Weight:    155 lb 4.8 oz (70.4 kg)  Height:        Intake/Output Summary (Last 24 hours) at 07/07/17 0912 Last data filed at 07/07/17 0700  Gross per 24 hour  Intake              480 ml  Output             1620 ml  Net            -1140 ml   Filed Weights   07/05/17 0624 07/06/17 0318 07/07/17 0440  Weight: 158 lb 8 oz (71.9 kg) 157 lb 11.2 oz (71.5 kg) 155 lb 4.8 oz (70.4 kg)    Telemetry    NSR  -  Personally Reviewed  ECG    NSR  - Personally Reviewed  Physical Exam   GEN: No acute distress.  Elderly man, noad  Neck: No JVD Cardiac: RR, no murmurs, rubs, or gallops.  Respiratory: Clear to auscultation bilaterally. GI: Soft, nontender, non-distended  MS: No edema; No deformity. Neuro:  Nonfocal  Psych: Normal affect   Labs    Chemistry Recent Labs Lab 07/04/17 0301 07/05/17 0333 07/06/17 0255 07/07/17 0335  NA 136 135 136 135  K 4.9 4.4 4.8 4.2  CL 106 104 99* 101  CO2 24 25 28 25   GLUCOSE 171* 187* 186* 169*  BUN 31* 37* 39* 41*  CREATININE 1.97* 2.22* 2.30* 2.26*  CALCIUM 8.6* 8.5* 9.0 8.6*  PROT 5.9*  --   --   --   ALBUMIN 2.5*  --   --   --   AST 17  --   --   --   ALT 25  --   --   --  ALKPHOS 74  --   --   --   BILITOT 0.3  --   --   --   GFRNONAA 31* 27* 26* 27*  GFRAA 37* 32* 30* 31*  ANIONGAP 6 6 9 9      Hematology Recent Labs Lab 07/05/17 0333 07/06/17 0255 07/07/17 0335  WBC 12.5* 12.9* 11.7*  RBC 3.10* 3.46*  3.46* 3.34*  HGB 8.3* 9.2* 9.0*  HCT 25.5* 28.2* 27.2*  MCV 82.3 81.5 81.4  MCH 26.8 26.6 26.9  MCHC 32.5 32.6 33.1  RDW 14.9 14.5 14.6  PLT 220 252 258    Cardiac Enzymes Recent Labs Lab 07/03/17 2159 07/04/17 0301 07/04/17 1002 07/05/17 0333  TROPONINI 0.56* 0.56* 0.53* 0.32*   No results for input(s): TROPIPOC in the last 168 hours.   BNPNo results for input(s): BNP, PROBNP in the last 168 hours.   DDimer No results for input(s): DDIMER in the last 168 hours.   Radiology    No results found.  Cardiac Studies      Patient Profile     76 y.o. male with known   Assessment & Plan    1. Coronary artery disease: Patient has remained off the nitroglycerin drip for the past 2 days. He seems to be doing well with Ranexa 500 mg twice a day. The plan is to increase the Ranexa to 1000 g twice a day at his next office visit with Dr. Tamala Julian. Continue aspirin and Plavix. Continue atorvastatin. From a standpoint  he can be discharged. He is having some blood clots that may need to be addressed.  2. Paroxysmal atrial fibrillation: He's has a history of bladder cancer and is passing blood clots. He is not on anticoagulation.  3. Essential hypertension: Continue current medications.  For questions or updates, please contact [place] Please consult [email or web address] for contact info under Cardiology/STEMI.      Signed, Mertie Moores, MD  07/07/2017, 9:12 AM

## 2017-07-08 ENCOUNTER — Other Ambulatory Visit: Payer: Self-pay | Admitting: Oncology

## 2017-07-08 DIAGNOSIS — C679 Malignant neoplasm of bladder, unspecified: Secondary | ICD-10-CM

## 2017-07-08 DIAGNOSIS — N179 Acute kidney failure, unspecified: Principal | ICD-10-CM

## 2017-07-08 LAB — BASIC METABOLIC PANEL
ANION GAP: 8 (ref 5–15)
BUN: 42 mg/dL — ABNORMAL HIGH (ref 6–20)
CHLORIDE: 99 mmol/L — AB (ref 101–111)
CO2: 26 mmol/L (ref 22–32)
Calcium: 8.7 mg/dL — ABNORMAL LOW (ref 8.9–10.3)
Creatinine, Ser: 2.43 mg/dL — ABNORMAL HIGH (ref 0.61–1.24)
GFR calc Af Amer: 28 mL/min — ABNORMAL LOW (ref >60)
GFR, EST NON AFRICAN AMERICAN: 24 mL/min — AB (ref >60)
GLUCOSE: 164 mg/dL — AB (ref 65–99)
POTASSIUM: 4.4 mmol/L (ref 3.5–5.1)
SODIUM: 133 mmol/L — AB (ref 135–145)

## 2017-07-08 LAB — GLUCOSE, CAPILLARY: Glucose-Capillary: 170 mg/dL — ABNORMAL HIGH (ref 65–99)

## 2017-07-08 LAB — CBC
HEMATOCRIT: 27.7 % — AB (ref 39.0–52.0)
HEMOGLOBIN: 9.2 g/dL — AB (ref 13.0–17.0)
MCH: 27 pg (ref 26.0–34.0)
MCHC: 33.2 g/dL (ref 30.0–36.0)
MCV: 81.2 fL (ref 78.0–100.0)
Platelets: 272 10*3/uL (ref 150–400)
RBC: 3.41 MIL/uL — AB (ref 4.22–5.81)
RDW: 14.6 % (ref 11.5–15.5)
WBC: 13.9 10*3/uL — ABNORMAL HIGH (ref 4.0–10.5)

## 2017-07-08 LAB — MAGNESIUM: MAGNESIUM: 1.8 mg/dL (ref 1.7–2.4)

## 2017-07-08 MED ORDER — INSULIN ASPART 100 UNIT/ML FLEXPEN: 5.0000 [IU] | PEN_INJECTOR | Freq: Three times a day (TID) | SUBCUTANEOUS | 11 refills | Status: AC

## 2017-07-08 MED ORDER — TORSEMIDE 20 MG PO TABS: 20.0000 mg | ORAL_TABLET | Freq: Every day | ORAL | 0 refills | Status: AC

## 2017-07-08 MED ORDER — HYDRALAZINE HCL 50 MG PO TABS: 50.0000 mg | ORAL_TABLET | Freq: Four times a day (QID) | ORAL | Status: DC

## 2017-07-08 MED ORDER — TRAMADOL HCL 50 MG PO TABS: 50.0000 mg | ORAL_TABLET | Freq: Four times a day (QID) | ORAL | 0 refills | Status: AC | PRN

## 2017-07-08 MED ORDER — LANTUS SOLOSTAR 100 UNIT/ML ~~LOC~~ SOPN: 14.0000 [IU] | PEN_INJECTOR | Freq: Every day | SUBCUTANEOUS | 0 refills | Status: AC

## 2017-07-08 MED ORDER — "PEN NEEDLES 5/16"" 31G X 8 MM MISC": 5.0000 [IU] | Freq: Three times a day (TID) | 0 refills | Status: AC

## 2017-07-08 MED ORDER — RANOLAZINE ER 500 MG PO TB12: 500.0000 mg | ORAL_TABLET | Freq: Two times a day (BID) | ORAL | 0 refills | Status: AC

## 2017-07-08 MED ORDER — DOCUSATE SODIUM 100 MG PO CAPS: 100.0000 mg | ORAL_CAPSULE | Freq: Two times a day (BID) | ORAL | 0 refills | Status: AC

## 2017-07-08 MED ORDER — TORSEMIDE 20 MG PO TABS: 20.0000 mg | ORAL_TABLET | Freq: Every day | ORAL | Status: DC

## 2017-07-08 MED ORDER — HYDRALAZINE HCL 50 MG PO TABS: 50.0000 mg | ORAL_TABLET | Freq: Four times a day (QID) | ORAL | 0 refills | Status: AC

## 2017-07-08 NOTE — Progress Notes (Signed)
Flushed foley with 170 cc. No clots noted.

## 2017-07-08 NOTE — Progress Notes (Signed)
Pt foley irrigated with [place] Fulling. Irrigated with 200 cc of normal saline. All fluid returned. Returned some small clots. Pt experienced relief. Will continue to monitor.   Fritz Pickerel, RN

## 2017-07-08 NOTE — Progress Notes (Signed)
Discussed with the patient and wife - all questioned fully answered. He will call me if any problems arise. Patient given paper prescriptions. Pt and wife verbalized understanding of all discharge instructions. Patient given supplies for bladder irrigation and walked through procedure.   Fritz Pickerel, RN

## 2017-07-08 NOTE — Care Management Important Message (Signed)
Important Message  Patient Details  Name: [name] MRN: [id number] Date of Birth: Oct 23, 1940   Medicare Important Message Given:  Yes    [name] 07/08/2017, 10:09 AM

## 2017-07-08 NOTE — Progress Notes (Signed)
Patient is known to me with a diagnosis of bladder cancer. 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  He was hospitalized for hematuria worsening renal insufficiency and overall debilitation. He is currently receiving continuous bladder irrigation.   I was asked to comment about his overall prognosis and plan of care. I discussed this with the patient and his wife face-to-face. His options would include systemic chemotherapy which will be palliative at best. Given his recent debilitation, recurrent hematuria and worsening renal insufficiency the window of opportunity to treat with systemic chemotherapy is causing rapidly. The benefits she would be receiving from chemotherapy is diminishing.  I do believe he might benefit from a short course of radiation therapy to the bladder to control his bleeding and hematuria continues to be an issue.   He will decide the long term whether he was to proceed with comfort care or consider chemotherapy as an outpatient. We will hold off on any Port-A-Cath insertion at this time.  His prognosis is poor overall regardless of which approach she takes. I believe his ability to tolerate aggressive therapy is diminishing given his debilitation currently.

## 2017-07-08 NOTE — Progress Notes (Signed)
Foley catheter not draining. Bladder scan showed 277 cc. Irrigated - removed some moderate sized clots. Attempted to irrigate again, instilled 50 cc of fluid, unable to get return. Paged urology. Awaiting return page.   Fritz Pickerel, RN

## 2017-07-08 NOTE — Progress Notes (Addendum)
Progress Note  Patient Name: [name] Date of Encounter: 07/08/2017  Primary Cardiologist: Tamala Julian   Subjective   Pt without chest pain of shortness of breath. He says that his abdominal distention is resolved. Is having right ankle gout pain. Says he is to be discharged today. He is still having blood in urine needing flushing of foley.  Inpatient Medications    Scheduled Meds: . amLODipine  10 mg Oral Daily  . aspirin EC  81 mg Oral Daily  . atorvastatin  20 mg Oral q1800  . cholecalciferol  400 Units Oral QHS  . clopidogrel  75 mg Oral Daily  . docusate sodium  100 mg Oral BID  . ferrous gluconate  324 mg Oral Q breakfast  . hydrALAZINE  50 mg Oral TID  . insulin aspart  0-9 Units Subcutaneous TID WC  . insulin glargine  14 Units Subcutaneous Daily  . isosorbide mononitrate  120 mg Oral Daily  . magnesium oxide  400 mg Oral QPM  . metoprolol tartrate  50 mg Oral BID  . pantoprazole  40 mg Oral Q1200  . ranolazine  500 mg Oral BID  . torsemide  40 mg Oral Daily   Continuous Infusions:  PRN Meds: acetaminophen **OR** acetaminophen, albuterol, guaiFENesin-dextromethorphan, morphine injection, nitroGLYCERIN, ondansetron **OR** ondansetron (ZOFRAN) IV, oxyCODONE, traMADol   Vital Signs    Vitals:   07/07/17 1524 07/07/17 2054 07/08/17 0509 07/08/17 0813  BP: (!) 139/58  (!) 175/58 (!) 162/54  Pulse: 71  79 71  Resp: 20 20    Temp: (!) 97.4 F (36.3 C) 99.2 F (37.3 C) 99.1 F (37.3 C) (!) 97.4 F (36.3 C)  TempSrc: Oral Oral Oral Oral  SpO2: 95%  96% 92%  Weight:   154 lb 11.2 oz (70.2 kg)   Height:        Intake/Output Summary (Last 24 hours) at 07/08/17 0958 Last data filed at 07/08/17 0515  Gross per 24 hour  Intake              250 ml  Output             1400 ml  Net            -1150 ml   Filed Weights   07/06/17 0318 07/07/17 0440 07/08/17 0509  Weight: 157 lb 11.2 oz (71.5 kg) 155 lb 4.8 oz (70.4 kg) 154 lb 11.2 oz (70.2 kg)    Telemetry      Non-tele  - Personally Reviewed  ECG    No new tracings.  - Personally Reviewed  Physical Exam   GEN: No acute distress.  Elderly man, noad  Neck: No JVD Cardiac: RR, no murmurs, rubs, or gallops.  Respiratory: Clear to auscultation bilaterally. GI: Soft, tender to palpation throughout, non-distended  MS: No edema; No deformity. Neuro:  Nonfocal  Psych: Normal affect   Labs    Chemistry Recent Labs Lab 07/04/17 0301  07/06/17 0255 07/07/17 0335 07/08/17 0229  NA 136  < > 136 135 133*  K 4.9  < > 4.8 4.2 4.4  CL 106  < > 99* 101 99*  CO2 24  < > 28 25 26   GLUCOSE 171*  < > 186* 169* 164*  BUN 31*  < > 39* 41* 42*  CREATININE 1.97*  < > 2.30* 2.26* 2.43*  CALCIUM 8.6*  < > 9.0 8.6* 8.7*  PROT 5.9*  --   --   --   --  ALBUMIN 2.5*  --   --   --   --   AST 17  --   --   --   --   ALT 25  --   --   --   --   ALKPHOS 74  --   --   --   --   BILITOT 0.3  --   --   --   --   GFRNONAA 31*  < > 26* 27* 24*  GFRAA 37*  < > 30* 31* 28*  ANIONGAP 6  < > 9 9 8   < > = values in this interval not displayed.   Hematology  Recent Labs Lab 07/06/17 0255 07/07/17 0335 07/08/17 0229  WBC 12.9* 11.7* 13.9*  RBC 3.46*  3.46* 3.34* 3.41*  HGB 9.2* 9.0* 9.2*  HCT 28.2* 27.2* 27.7*  MCV 81.5 81.4 81.2  MCH 26.6 26.9 27.0  MCHC 32.6 33.1 33.2  RDW 14.5 14.6 14.6  PLT 252 258 272    Cardiac Enzymes  Recent Labs Lab 07/03/17 2159 07/04/17 0301 07/04/17 1002 07/05/17 0333  TROPONINI 0.56* 0.56* 0.53* 0.32*   No results for input(s): TROPIPOC in the last 168 hours.   BNPNo results for input(s): BNP, PROBNP in the last 168 hours.   DDimer No results for input(s): DDIMER in the last 168 hours.   Radiology    No results found.  Cardiac Studies    Echocardiogram 07/04/17 Study Conclusions  - Left ventricle: The cavity size was normal. There was moderate   concentric hypertrophy. Systolic function was normal. The   estimated ejection fraction was in the  range of 60% to 65%.   Features are consistent with a pseudonormal left ventricular   filling pattern, with concomitant abnormal relaxation and   increased filling pressure (grade 2 diastolic dysfunction).   Doppler parameters are consistent with high ventricular filling   pressure. - Mitral valve: Valve area by pressure half-time: 2.32 cm^2. Valve   area by continuity equation (using LVOT flow): 1.89 cm^2. - Right atrium: The atrium was mildly dilated. - Pulmonary arteries: PA peak pressure: 40 mm Hg (S).   Patient Profile     76 y.o. male with hx of CAD, CABG. Admitted with progressive renal insufficiency, hematura, chest pain. Has metastatic bladder cancer  Assessment & Plan    1. Coronary artery disease: Patient has remained off the nitroglycerin drip for the past 2 days. He seems to be doing well with Ranexa 500 mg twice a day. The plan is to increase the Ranexa to 1000 g twice a day at his next office visit with Dr. Tamala Julian. Continue aspirin and Plavix. Continue atorvastatin. From a standpoint he can be discharged.  We will sign off.   2. Paroxysmal atrial fibrillation: He has a history of bladder cancer and is passing blood clots. He is not on anticoagulation.  3. Essential hypertension: Continue current medications.  For questions or updates, please contact [place] Please consult [email or web address] for contact info under Cardiology/STEMI.      Signed, Daune Perch, NP  07/08/2017, 9:58 AM     Attending Note:   The patient was seen and examined.  Agree with assessment and plan as noted above.  Changes made to the above note as needed.  Patient seen and independently examined with Pecolia Ades, NP.   We discussed all aspects of the encounter. I agree with the assessment and plan as stated above.  1.  Unstable angina :   Doing  well on the Ranexa. Continue current meds Will follow up with Dr. Tamala Julian  Echo shows normal LV systolic function, grade 2 diastolic  dysfunction    I have spent a total of 40 minutes with patient reviewing hospital  notes , telemetry, EKGs, labs and examining patient as well as establishing an assessment and plan that was discussed with the patient. > 50% of time was spent in direct patient care.  Will sign off Call for questions   Ramond Dial., MD, [facility] 07/08/2017, 11:24 AM [address],  [place] Pager [phone]

## 2017-07-08 NOTE — Progress Notes (Signed)
Secretary spoke with Dr. Hazeline Junker office - appointment scheduled for Tuesday at 10:30 AM - added to discharge paperwork. RN spoke with RN at Dr. Lyndal Rainbow office - office is aware of the need for the appointment and will call patient at home to schedule. Patient and wife made aware.   Fritz Pickerel, RN

## 2017-07-08 NOTE — Consult Note (Signed)
[place]: Pt seen with wife at bedside. Pt alert and oriented. Explained services of Hospice in the home. They are in agreement to the care and philosoph of hospice. He will be able to have short term radiation for palliative and it be covered by hospice care to the bladder to help with bleeding. The pt. Has a DNR and will be transported home by car. We will be available proceed with initial intake when he gets home. [facility] liaison  [phone]

## 2017-07-08 NOTE — Discharge Summary (Signed)
Physician Discharge Summary  [name] CWC:[id number] DOB: 1941/03/12 DOA: 07/01/2017  PCP: Shirline Frees, MD  Admit date: 07/01/2017 Discharge date: 07/08/2017  Time spent: 35 minutes  Recommendations for Outpatient Follow-up: Gross hematuria with obstructive uropathy  -Most likely secondary to metastatic bladder cancer. -Renal ultrasound noted mild left hydronephrosis similar recent PET/CT -Continue Foley until seen by Urology: Flush Foley with 200-300 mL normal saline PRN clots -Patient continues to have frank hematuria. Urology recommends continuing Foley through discharge, ensure patient and wife are comfortable with bladder irrigation to clear blood clots. -Schedule follow-up appointment in 1 week with Dr. Festus Aloe Urology obstructive uropathy secondary to metastatic bladder cancer   UTI -Completed course antibiotics    Metastatic Bladder Cancer -9/20 Dr. Zola Button Oncology evaluated patient and informed him and wife the likelihood of benefit from chemotherapy severely diminished. Would most likely Have increased benefit from radiation treatment. -Schedule follow-up Dr. Zola Button Oncology in one week to discuss radiation treatment for metastatic bladder cancer.      Normocytic anemia/Anemia of Chronic Disease      Recent Labs Lab 07/03/17 1156 07/04/17 0301 07/05/17 0333 07/06/17 0255 07/07/17 0335 07/08/17 0229  HGB 10.6* 9.2* 8.3* 9.2* 9.0* 9.2*  -Stable. -Occult blood: Never obtained -Most likely combination of metastatic cancer, hematuria.  -Anemia panel most consistent with anemia of chronic disease.    Acute Renal Failure on CKD Stage 3(Baseline Cr 1.8-2.2)    Recent Labs Lab 07/03/17 1538 07/04/17 0301 07/05/17 0333 07/06/17 0255 07/07/17 0335 07/08/17 0229  CREATININE 1.91* 1.97* 2.22* 2.30* 2.26* 2.43*  - Cr slightly elevated today, but has been hovering around patient's baseline. PCP/Urologist to follow closely    Paroxysmal  atrial fibrillation  -Currently in NSR -Not on chronic anticoagulation and would not start given patient's hematuria.    Chest pain/CAD -S/P CABG 6 (1999) and redo-CABG 3 (2015) - CAD s/p CABG x6 '99 and redo-CABG x3 '15 -Per cardiology continue conservative treatment, given patient's poor renal function. Patient wife understand not a candidate for invasive cardiac procedure.   -Resolved   Chronic systolic and diastolic CHF (Base VOHYWV~37 kg)  -Strict I&OSince admission -2.4 L  -Daily weight Filed Weights   07/06/17 0318 07/07/17 0440 07/08/17 0509  Weight: 157 lb 11.2 oz (71.5 kg) 155 lb 4.8 oz (70.4 kg) 154 lb 11.2 oz (70.2 kg)  -Close to baseline: Slightly dehydrated.   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 -PCP to adjust medications   HLD -Lipitor 20 mg daily   COPD Well compensated presently    Goals of care -PALLIATIVE CARE: Has arranged for patient be seen by [facility] at time of discharge.    Discharge Diagnoses:  Active Problems:   CKD (chronic kidney disease) stage 3, GFR 30-59 ml/min   Chronic diastolic heart failure (HCC)   HTN (hypertension)   Anemia   Renal failure   AKI (acute kidney injury) ([place])   Hyperkalemia   Troponin level elevated   DNR (do not resuscitate) discussion   Palliative care by specialist   Discharge Condition:  guarded  Diet recommendation: heart healthy/carb modified  Filed Weights   07/06/17  5809 07/07/17 0440 07/08/17 0509  Weight: 157 lb 11.2 oz (71.5 kg) 155 lb 4.8 oz (70.4 kg) 154 lb 11.2 oz (70.2 kg)    History of present  illness:  76 y.o. 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 The day prior to admission he started to have difficulty with catheter. The catheter got clotted with blood. He report gross hematuria and clots. He denies fever, chills. He has an episode of chest pain after he received a medication in the ED> '' Bicarb amp" . He received nitroglycerin. He is currently chest pain free. He had some ST changes in the EKG> case was reviewed with cardiology on call, who will see patient in consultation at [facility] long. The pt was transferred to [facility] for Cardiology evaluation During his hospitalization patient was treated for obstructive uropathy with hematuria secondary to metastatic bladder cancer. Patient will be discharged with a Foley and he and his wife and taught how to use saline flush to ensure patency of Foley. Will follow-up with urology. In addition patient's metastatic bladder cancer was evaluated and primary tumor metastasis have increased to the point that chemotherapy does not appear to be a treatment option. Palliative radiation has been offered by oncology. Patient will follow-up with his oncologist. Patient was also evaluated by cardiology secondary to chest pain/CAD/chronic systolic and diastolic CHF. Given patient's multiple core morbidities determined that medical management would be patient's best course of action. Patient stable for discharge.    Procedures: None   Consultations: Cardiology Urology Oncology    Cultures  9/14 urine positive multiple species   Antibiotics Anti-infectives    Start     Stop   07/02/17 1200  cefTRIAXone (ROCEPHIN) 1 g in dextrose 5 % 50 mL IVPB     07/06/17 1306   07/01/17 1300  cefTRIAXone (ROCEPHIN) 1 g in dextrose 5 % 50 mL IVPB     07/01/17 1405       Discharge  Exam: Vitals:   07/07/17 1524 07/07/17 2054 07/08/17 0509 07/08/17 0813  BP: (!) 139/58  (!) 175/58 (!) 162/54  Pulse: 71  79 71  Resp: 20 20    Temp: (!) 97.4 F (36.3 C) 99.2 F (37.3 C) 99.1 F (37.3 C) (!) 97.4 F (36.3 C)  TempSrc: Oral Oral Oral Oral  SpO2: 95%  96% 92%  Weight:   154 lb 11.2 oz (70.2 kg)   Height:          General: A/O 4, No acute respiratory distress Neck:  Negative scars, masses, torticollis, lymphadenopathy, JVD Lungs: Clear to auscultation bilaterally without wheezes or crackles Cardiovascular: Regular rate and rhythm without murmur gallop or rub normal S1 and S2 Abdomen: Negative suprapubic abdominal pain, positive mild LLQ abdominal pain to palpation, positive distention, positive soft, bowel sounds, no rebound, no ascites, no appreciable mass   Discharge Instructions   Allergies as of 07/08/2017      Reactions   Other Itching, Other (See Comments)   The iv dye used in Fundus Flourescein Angiography procedure  Kidney issues   Penicillins Hives   Augmentin Has patient had a PCN reaction causing immediate rash, facial/tongue/throat swelling, SOB or lightheadedness with hypotension: No Has patient had a PCN reaction causing severe rash involving mucus membranes or skin necrosis: No Has patient had a PCN reaction that  required hospitalization: Yes Has patient had a PCN reaction occurring within the last 10 years: Yes If all of the above answers are "NO", then may proceed with Cephalosporin use.      Medication List    STOP taking these medications   acetaminophen 500 MG tablet Commonly known as:  TYLENOL   GLIPIZIDE XL 10 MG 24 hr tablet Generic drug:  glipiZIDE   phenazopyridine 200 MG tablet Commonly known as:  PYRIDIUM   prochlorperazine 10 MG tablet Commonly known as:  COMPAZINE     TAKE these medications   amLODipine 10 MG tablet Commonly known as:  NORVASC Take 10 mg by mouth every morning.   aspirin 81 MG EC tablet Take  1 tablet (81 mg total) by mouth daily.   atorvastatin 20 MG tablet Commonly known as:  LIPITOR Take 20 mg by mouth daily at 6 PM.   cholecalciferol 400 units Tabs tablet Commonly known as:  VITAMIN D Take 400 Units by mouth at bedtime.   clopidogrel 75 MG tablet Commonly known as:  PLAVIX TAKE 1 TABLET BY MOUTH DAILY   docusate sodium 100 MG capsule Commonly known as:  COLACE Take 1 capsule (100 mg total) by mouth 2 (two) times daily.   hydrALAZINE 50 MG tablet Commonly known as:  APRESOLINE Take 1 tablet (50 mg total) by mouth every 6 (six) hours. What changed:  See the new instructions.   HYDROcodone-acetaminophen 10-325 MG tablet Commonly known as:  NORCO Take 1-2 tablets by mouth every 4 (four) hours as needed for moderate pain. Maximum dose per 24 hours - 8 pills   insulin aspart 100 UNIT/ML FlexPen Commonly known as:  NOVOLOG FLEXPEN Inject 5 Units into the skin 3 (three) times daily with meals.   IRON 27 PO Take 1 tablet by mouth every evening.   isosorbide mononitrate 120 MG 24 hr tablet Commonly known as:  IMDUR Take 1 tablet (120 mg total) by mouth daily.   LANTUS SOLOSTAR 100 UNIT/ML Solostar Pen Generic drug:  Insulin Glargine Inject 14 Units into the skin daily. What changed:  how much to take   magnesium oxide 400 (241.3 Mg) MG tablet Commonly known as:  MAG-OX Take 400 mg by mouth every evening.   metoprolol tartrate 100 MG tablet Commonly known as:  LOPRESSOR Take 0.5 tablets (50 mg total) by mouth 2 (two) times daily.   nitroGLYCERIN 0.4 MG SL tablet Commonly known as:  NITROSTAT PLACE 1 TABLET UNDER THE TONGUE EVERY 5 MINUTES AS NEEDED FOR CHEST PAIN   pantoprazole 40 MG tablet Commonly known as:  PROTONIX Take 1 tablet (40 mg total) by mouth daily. What changed:  when to take this   PEN NEEDLES 31GX5/16" 31G X 8 MM Misc 5 Units by Does not apply route 3 (three) times daily with meals.   ranolazine 500 MG 12 hr tablet Commonly known  as:  RANEXA Take 1 tablet (500 mg total) by mouth 2 (two) times daily.   torsemide 20 MG tablet Commonly known as:  DEMADEX Take 1 tablet (20 mg total) by mouth daily. What changed:  See the new instructions.   traMADol 50 MG tablet Commonly known as:  ULTRAM Take 1 tablet (50 mg total) by mouth every 6 (six) hours as needed for moderate pain. What changed:  See the new instructions.            Discharge Care Instructions        Start     Ordered  07/08/17 0000  hydrALAZINE (APRESOLINE) 50 MG tablet  Every 6 hours     07/08/17 1429   07/08/17 0000  LANTUS SOLOSTAR 100 UNIT/ML Solostar Pen  Daily     07/08/17 1429   07/08/17 0000  torsemide (DEMADEX) 20 MG tablet  Daily     07/08/17 1429   07/08/17 0000  traMADol (ULTRAM) 50 MG tablet  Every 6 hours PRN     07/08/17 1429   07/08/17 0000  docusate sodium (COLACE) 100 MG capsule  2 times daily     07/08/17 1429   07/08/17 0000  insulin aspart (NOVOLOG FLEXPEN) 100 UNIT/ML FlexPen  3 times daily with meals     07/08/17 1429   07/08/17 0000  Insulin Pen Needle (PEN NEEDLES 31GX5/16") 31G X 8 MM MISC  3 times daily with meals     07/08/17 1429   07/08/17 0000  ranolazine (RANEXA) 500 MG 12 hr tablet  2 times daily     07/08/17 1429     Allergies  Allergen Reactions  . Other Itching and Other (See Comments)    The iv dye used in Fundus Flourescein Angiography procedure  Kidney issues  . Penicillins Hives    Augmentin Has patient had a PCN reaction causing immediate rash, facial/tongue/throat swelling, SOB or lightheadedness with hypotension: No Has patient had a PCN reaction causing severe rash involving mucus membranes or skin necrosis: No Has patient had a PCN reaction that required hospitalization: Yes Has patient had a PCN reaction occurring within the last 10 years: Yes If all of the above answers are "NO", then may proceed with Cephalosporin use.    Follow-up Information    [facility] Follow up.    Why:  Home Hospice referral made- they will f/u with pt/family for home hospice services Contact information: [address] [place] [address] [phone]        Festus Aloe, MD. Schedule an appointment as soon as possible for a visit in 1 week(s).   Specialty:  Urology Why:  Office will call to Schedule follow-up appointment in 1 week with Dr. Festus Aloe Urology obstructive uropathy secondary to metastatic bladder cancer Contact information: [place] [address] [phone]        Wyatt Portela, MD. Go on 07/12/2017.   Specialty:  Oncology Why:  Appointment with Dr. Alen Blew Oncology in one week to discuss radiation treatment for metastatic bladder CA  Tues at 10:30 am  Contact information: [place] [address] [phone]            The results of significant diagnostics from this hospitalization (including imaging, microbiology, ancillary and laboratory) are listed below for reference.    Significant Diagnostic Studies: US Renal  Result Date: 07/02/2017 CLINICAL DATA:  Gross hematuria. EXAM: RENAL / URINARY TRACT ULTRASOUND COMPLETE COMPARISON:  PET-CT dated June 07, 2017. Renal ultrasound dated December 25, 2014. FINDINGS: Right Kidney: Length: 10.2 cm. Echogenicity within normal limits. No mass or hydronephrosis visualized. Left Kidney: Length: 11.2 cm. Echogenicity within normal limits. Mild hydronephrosis. No mass visualized. Bladder: Decompressed by Foley catheter. IMPRESSION: Mild left hydronephrosis, similar to recent PET-CT, allowing for differences in technique. Electronically Signed   By: Titus Dubin M.D.   On: 07/02/2017 10:42   Dg Chest Port 1 View  Result Date: 07/03/2017 CLINICAL DATA:  Shortness of breath EXAM: PORTABLE CHEST 1 VIEW COMPARISON:  07/01/2017 and previous FINDINGS: Previous median sternotomy and CABG. The heart  is enlarged. There is interstitial pulmonary edema. No infiltrate,  consolidation or collapse. No effusion. IMPRESSION: New development of interstitial pulmonary edema. Electronically Signed   By: Nelson Chimes M.D.   On: 07/03/2017 13:48   Dg Chest Port 1 View  Result Date: 07/01/2017 CLINICAL DATA:  History of bladder cancer. EXAM: PORTABLE CHEST 1 VIEW COMPARISON:  PET-CT 06/07/2017. FINDINGS: Prior CABG. Cardiomegaly with pulmonary vascular prominence. Mild interstitial prominence. Mild component congestive heart failure cannot be excluded. No pleural effusion or pneumothorax. IMPRESSION: Prior CABG. A mild component of congestive heart failure cannot be excluded. Electronically Signed   By: Marcello Moores  Register   On: 07/01/2017 15:02    Microbiology: Recent Results (from the past 240 hour(s))  Urine culture     Status: Abnormal   Collection Time: 07/01/17 11:30 AM  Result Value Ref Range Status   Specimen Description URINE, CATHETERIZED  Final   Special Requests NONE  Final   Culture MULTIPLE SPECIES PRESENT, SUGGEST RECOLLECTION (A)  Final   Report Status 07/02/2017 FINAL  Final     Labs: Basic Metabolic Panel:  Recent Labs Lab 07/04/17 0301 07/05/17 0333 07/06/17 0255 07/07/17 0335 07/08/17 0229  NA 136 135 136 135 133*  K 4.9 4.4 4.8 4.2 4.4  CL 106 104 99* 101 99*  CO2 24 25 28 25 26   GLUCOSE 171* 187* 186* 169* 164*  BUN 31* 37* 39* 41* 42*  CREATININE 1.97* 2.22* 2.30* 2.26* 2.43*  CALCIUM 8.6* 8.5* 9.0 8.6* 8.7*  MG  --   --  1.8 1.8 1.8   Liver Function Tests:  Recent Labs Lab 07/04/17 0301  AST 17  ALT 25  ALKPHOS 74  BILITOT 0.3  PROT 5.9*  ALBUMIN 2.5*   No results for input(s): LIPASE, AMYLASE in the last 168 hours. No results for input(s): AMMONIA in the last 168 hours. CBC:  Recent Labs Lab 07/04/17 0301 07/05/17 0333 07/06/17 0255 07/07/17 0335 07/08/17 0229  WBC 15.8* 12.5* 12.9* 11.7* 13.9*  HGB 9.2* 8.3* 9.2* 9.0* 9.2*  HCT 28.2* 25.5* 28.2* 27.2* 27.7*  MCV 82.5 82.3 81.5 81.4 81.2  PLT 252 220 252  258 272   Cardiac Enzymes:  Recent Labs Lab 07/03/17 1156 07/03/17 2159 07/04/17 0301 07/04/17 1002 07/05/17 0333  TROPONINI 0.67* 0.56* 0.56* 0.53* 0.32*   BNP: BNP (last 3 results) No results for input(s): BNP in the last 8760 hours.  ProBNP (last 3 results) No results for input(s): PROBNP in the last 8760 hours.  CBG:  Recent Labs Lab 07/06/17 1129 07/06/17 1654 07/06/17 2137 07/07/17 1122 07/07/17 1639  GLUCAP 196* 177* 159* 185* 157*       Signed:  Dia Crawford, MD [facility] [phone] pager

## 2017-07-11 ENCOUNTER — Ambulatory Visit (HOSPITAL_COMMUNITY): Payer: Medicare Other

## 2017-07-11 ENCOUNTER — Inpatient Hospital Stay (HOSPITAL_COMMUNITY)
Admission: EM | Admit: 2017-07-11 | Discharge: 2017-07-14 | DRG: 949 | Disposition: A | Discharge status: Hospice | Attending: Internal Medicine | Admitting: Internal Medicine

## 2017-07-11 ENCOUNTER — Encounter (HOSPITAL_COMMUNITY): Payer: Self-pay | Admitting: Emergency Medicine

## 2017-07-11 ENCOUNTER — Inpatient Hospital Stay (HOSPITAL_COMMUNITY): Admission: RE | Admit: 2017-07-11 | Payer: Medicare Other | Source: Ambulatory Visit

## 2017-07-11 ENCOUNTER — Emergency Department (HOSPITAL_COMMUNITY)
Admission: EM | Admit: 2017-07-11 | Discharge: 2017-07-11 | Disposition: A | Discharge status: Home / Self Care | Attending: Emergency Medicine | Admitting: Emergency Medicine

## 2017-07-11 ENCOUNTER — Encounter: Payer: Self-pay | Admitting: Radiation Oncology

## 2017-07-11 DIAGNOSIS — C679 Malignant neoplasm of bladder, unspecified: Secondary | ICD-10-CM | POA: Diagnosis present

## 2017-07-11 DIAGNOSIS — Y828 Other medical devices associated with adverse incidents: Secondary | ICD-10-CM | POA: Diagnosis not present

## 2017-07-11 DIAGNOSIS — E785 Hyperlipidemia, unspecified: Secondary | ICD-10-CM | POA: Diagnosis present

## 2017-07-11 DIAGNOSIS — Z85828 Personal history of other malignant neoplasm of skin: Secondary | ICD-10-CM

## 2017-07-11 DIAGNOSIS — Z79899 Other long term (current) drug therapy: Secondary | ICD-10-CM

## 2017-07-11 DIAGNOSIS — Z8349 Family history of other endocrine, nutritional and metabolic diseases: Secondary | ICD-10-CM

## 2017-07-11 DIAGNOSIS — N312 Flaccid neuropathic bladder, not elsewhere classified: Secondary | ICD-10-CM | POA: Diagnosis present

## 2017-07-11 DIAGNOSIS — Z8249 Family history of ischemic heart disease and other diseases of the circulatory system: Secondary | ICD-10-CM

## 2017-07-11 DIAGNOSIS — I252 Old myocardial infarction: Secondary | ICD-10-CM

## 2017-07-11 DIAGNOSIS — D649 Anemia, unspecified: Secondary | ICD-10-CM

## 2017-07-11 DIAGNOSIS — Z981 Arthrodesis status: Secondary | ICD-10-CM

## 2017-07-11 DIAGNOSIS — N189 Chronic kidney disease, unspecified: Secondary | ICD-10-CM

## 2017-07-11 DIAGNOSIS — N183 Chronic kidney disease, stage 3 (moderate): Secondary | ICD-10-CM | POA: Diagnosis not present

## 2017-07-11 DIAGNOSIS — E1122 Type 2 diabetes mellitus with diabetic chronic kidney disease: Secondary | ICD-10-CM | POA: Diagnosis not present

## 2017-07-11 DIAGNOSIS — I2510 Atherosclerotic heart disease of native coronary artery without angina pectoris: Secondary | ICD-10-CM | POA: Diagnosis present

## 2017-07-11 DIAGNOSIS — J449 Chronic obstructive pulmonary disease, unspecified: Secondary | ICD-10-CM | POA: Insufficient documentation

## 2017-07-11 DIAGNOSIS — T83498A Other mechanical complication of other prosthetic devices, implants and grafts of genital tract, initial encounter: Secondary | ICD-10-CM | POA: Diagnosis not present

## 2017-07-11 DIAGNOSIS — Z794 Long term (current) use of insulin: Secondary | ICD-10-CM | POA: Diagnosis not present

## 2017-07-11 DIAGNOSIS — N179 Acute kidney failure, unspecified: Secondary | ICD-10-CM

## 2017-07-11 DIAGNOSIS — Z87891 Personal history of nicotine dependence: Secondary | ICD-10-CM | POA: Insufficient documentation

## 2017-07-11 DIAGNOSIS — Y738 Miscellaneous gastroenterology and urology devices associated with adverse incidents, not elsewhere classified: Secondary | ICD-10-CM | POA: Diagnosis present

## 2017-07-11 DIAGNOSIS — R710 Precipitous drop in hematocrit: Secondary | ICD-10-CM | POA: Diagnosis not present

## 2017-07-11 DIAGNOSIS — Z7982 Long term (current) use of aspirin: Secondary | ICD-10-CM | POA: Insufficient documentation

## 2017-07-11 DIAGNOSIS — Z951 Presence of aortocoronary bypass graft: Secondary | ICD-10-CM

## 2017-07-11 DIAGNOSIS — Z79891 Long term (current) use of opiate analgesic: Secondary | ICD-10-CM

## 2017-07-11 DIAGNOSIS — I5042 Chronic combined systolic (congestive) and diastolic (congestive) heart failure: Secondary | ICD-10-CM | POA: Diagnosis present

## 2017-07-11 DIAGNOSIS — D62 Acute posthemorrhagic anemia: Secondary | ICD-10-CM | POA: Diagnosis present

## 2017-07-11 DIAGNOSIS — D72829 Elevated white blood cell count, unspecified: Secondary | ICD-10-CM | POA: Diagnosis present

## 2017-07-11 DIAGNOSIS — Z66 Do not resuscitate: Secondary | ICD-10-CM | POA: Diagnosis present

## 2017-07-11 DIAGNOSIS — R319 Hematuria, unspecified: Secondary | ICD-10-CM | POA: Diagnosis present

## 2017-07-11 DIAGNOSIS — T83091D Other mechanical complication of indwelling urethral catheter, subsequent encounter: Secondary | ICD-10-CM | POA: Diagnosis not present

## 2017-07-11 DIAGNOSIS — T83091A Other mechanical complication of indwelling urethral catheter, initial encounter: Secondary | ICD-10-CM

## 2017-07-11 DIAGNOSIS — K219 Gastro-esophageal reflux disease without esophagitis: Secondary | ICD-10-CM | POA: Diagnosis present

## 2017-07-11 DIAGNOSIS — I10 Essential (primary) hypertension: Secondary | ICD-10-CM | POA: Diagnosis present

## 2017-07-11 DIAGNOSIS — M353 Polymyalgia rheumatica: Secondary | ICD-10-CM | POA: Diagnosis present

## 2017-07-11 DIAGNOSIS — I129 Hypertensive chronic kidney disease with stage 1 through stage 4 chronic kidney disease, or unspecified chronic kidney disease: Secondary | ICD-10-CM | POA: Diagnosis not present

## 2017-07-11 DIAGNOSIS — Z880 Allergy status to penicillin: Secondary | ICD-10-CM

## 2017-07-11 DIAGNOSIS — T839XXD Unspecified complication of genitourinary prosthetic device, implant and graft, subsequent encounter: Secondary | ICD-10-CM

## 2017-07-11 DIAGNOSIS — I130 Hypertensive heart and chronic kidney disease with heart failure and stage 1 through stage 4 chronic kidney disease, or unspecified chronic kidney disease: Secondary | ICD-10-CM | POA: Diagnosis present

## 2017-07-11 DIAGNOSIS — R339 Retention of urine, unspecified: Secondary | ICD-10-CM | POA: Diagnosis not present

## 2017-07-11 DIAGNOSIS — E1159 Type 2 diabetes mellitus with other circulatory complications: Secondary | ICD-10-CM | POA: Diagnosis present

## 2017-07-11 DIAGNOSIS — T83198A Other mechanical complication of other urinary devices and implants, initial encounter: Secondary | ICD-10-CM | POA: Diagnosis not present

## 2017-07-11 DIAGNOSIS — G4733 Obstructive sleep apnea (adult) (pediatric): Secondary | ICD-10-CM | POA: Diagnosis present

## 2017-07-11 DIAGNOSIS — R310 Gross hematuria: Secondary | ICD-10-CM | POA: Diagnosis present

## 2017-07-11 DIAGNOSIS — Z9842 Cataract extraction status, left eye: Secondary | ICD-10-CM

## 2017-07-11 DIAGNOSIS — G4700 Insomnia, unspecified: Secondary | ICD-10-CM | POA: Diagnosis present

## 2017-07-11 DIAGNOSIS — T83098A Other mechanical complication of other indwelling urethral catheter, initial encounter: Secondary | ICD-10-CM | POA: Diagnosis not present

## 2017-07-11 DIAGNOSIS — T83098D Other mechanical complication of other indwelling urethral catheter, subsequent encounter: Secondary | ICD-10-CM | POA: Diagnosis not present

## 2017-07-11 DIAGNOSIS — I739 Peripheral vascular disease, unspecified: Secondary | ICD-10-CM | POA: Diagnosis present

## 2017-07-11 DIAGNOSIS — N131 Hydronephrosis with ureteral stricture, not elsewhere classified: Secondary | ICD-10-CM | POA: Diagnosis present

## 2017-07-11 DIAGNOSIS — Z9841 Cataract extraction status, right eye: Secondary | ICD-10-CM

## 2017-07-11 DIAGNOSIS — I25709 Atherosclerosis of coronary artery bypass graft(s), unspecified, with unspecified angina pectoris: Secondary | ICD-10-CM | POA: Diagnosis present

## 2017-07-11 DIAGNOSIS — Z91041 Radiographic dye allergy status: Secondary | ICD-10-CM

## 2017-07-11 DIAGNOSIS — N139 Obstructive and reflux uropathy, unspecified: Secondary | ICD-10-CM | POA: Diagnosis present

## 2017-07-11 DIAGNOSIS — R100 Acute abdomen: Secondary | ICD-10-CM | POA: Diagnosis not present

## 2017-07-11 DIAGNOSIS — F419 Anxiety disorder, unspecified: Secondary | ICD-10-CM | POA: Diagnosis present

## 2017-07-11 DIAGNOSIS — I4891 Unspecified atrial fibrillation: Secondary | ICD-10-CM | POA: Diagnosis present

## 2017-07-11 DIAGNOSIS — Z961 Presence of intraocular lens: Secondary | ICD-10-CM | POA: Diagnosis present

## 2017-07-11 LAB — URINALYSIS, ROUTINE W REFLEX MICROSCOPIC
GLUCOSE, UA: NEGATIVE mg/dL
Ketones, ur: NEGATIVE mg/dL
Nitrite: POSITIVE — AB
Protein, ur: >300 mg/dL — AB
SPECIFIC GRAVITY, URINE: 1.02 (ref 1.005–1.030)
pH: 6.5 (ref 5.0–8.0)

## 2017-07-11 LAB — URINALYSIS, MICROSCOPIC (REFLEX): SQUAMOUS EPITHELIAL / LPF: NONE SEEN

## 2017-07-11 NOTE — ED Provider Notes (Signed)
[facility] DEPT Provider Note   CSN: [id number] Arrival date & time: 07/11/17  2050     History   Chief Complaint Chief Complaint  Patient presents with  . Urinary Retention    HPI [name] is a 76 y.o. male w PMHx bladder cancer (w chronic indwelling foley catheter), chronic hematuria (2/t bladder cancer), CKD, COPD, CAD, a fib, GERD, HTN, DM, presenting to ED for urinary retention 2/t foley catheter blockage. Pt w chronic hematuria 2/t bladder cancer, and has been having issues to foley catheter blockage more frequently. Patient was seen last night for same complaint, and 18Fr catheter was replaced with 22Fr catheter. U/A not consistent with UTI. Patient states catheter was draining well until late this afternoon when it stopped draining. His home health nurse switched out his catheter tonight 22Fr, however he states it was not draining for a few hours before they called EMS. He states his nurse called the hospice center where he was supposed to move to tonight, however they recommended he report to the ED to manage the complication. He states on the ride here his catheter began draining, and his lower abdominal pressure significantly improved. He denies fever, or any other complaints today. He reports he has an oncologist appointment 1030 tomorrow, to discuss palliative radiation therapy to help the bleeding in his bladder. Pt taking plavix and aspirin daily for multiple MI's and afib, however pt decided to stop taking yesterday with hopes of helping hematuria. Pt is DNR.  The history is provided by the patient.    Past Medical History:  Diagnosis Date  . Anemia    a. + FOBT 08/2014.  Marland Kitchen Anginal pain ([place])    comes and goes; uses nitro to relieve   . Arthritis   . Atherosclerosis of native arteries of the extremities with intermittent claudication    a. L external iliac stent 2001, bilateral SFA occlusions documented 2001.  . Atrial fibrillation ([place])   . Bladder cancer  ([place])   . CAD (coronary artery disease), native coronary artery 09/24/2013   a. CABG x 6 1999. b. Interim PCIs: Angioplasty SVG-PDA 2006.Taxus DES to native LCx in 2007.DES to SVG-PDA 2007, restented 2009.NSTEMI with PCI to SVG- PDA complicated by no-reflow/inferior infarction in 2012. c. Redo CABG 2015 with SVG-PD, SVG-D1, SVG-OM1.  . Carotid artery occlusion    a. Duplex 12/2013: mild plaque bilat, 40-59% BICA. F/u due 12/2014.  Marland Kitchen Chronic combined systolic and diastolic CHF (congestive heart failure) ([place])    a. Prior EF 45-50% in 08/2014. b. Improved EF >55% in 09/2014 at [facility].  . Chronic lower back pain   . CKD (chronic kidney disease) stage 3, GFR 30-59 ml/min   . COPD (chronic obstructive pulmonary disease) ([place])    pt states he doesn't have  . Daily headache    comes and goes pt denies daily currently   . Dysrhythmia   . Esophageal stricture    a. History of dilitation - Long-standing history of esophageal reflux   . Gait instability   . GERD (gastroesophageal reflux disease)   . Gout   . History of bronchitis   . History of gout   . History of hiatal hernia   . Hx of CABG 09/24/2013   a. 1999: LIMA to LAD, sequential SVG to diagonal 2 and diagonal 3, sequential SVG to PDA, acute marginal branch, and PL branch. b. Repeat CABG 2015 with SVG to D1, SVG to PDA, and SVG to OM.   Marland Kitchen Hyperlipidemia   .  Hypertension   . Hypotonic bladder    Requiring in and out catheterization performed by the patient at home - occasional blood tinged urine (self-cath's daily for the last 2 years) and hematuria is chronic for him, followed by urology.  . MI (myocardial infarction) ([place]) 1999-02/2014   total of 7 MI per patient  . OSA (obstructive sleep apnea)    "don't wear mask"   . Polymyalgia rheumatica ([place])   . Self-catheterizes urinary bladder   . Shortness of breath dyspnea   . Skin cancer   . SVT (supraventricular tachycardia) ([place])   . Type II diabetes mellitus [facility])     Patient Active  Problem List   Diagnosis Date Noted  . DNR (do not resuscitate) discussion   . Palliative care by specialist   . Renal failure 07/01/2017  . AKI (acute kidney injury) ([place]) 07/01/2017  . Hyperkalemia 07/01/2017  . Troponin level elevated 07/01/2017  . Malignant neoplasm of urinary bladder ([place]) 06/29/2017  . Old MI (myocardial infarction) 08/01/2016  . Chest pain   . Hematuria 12/18/2015  . SVT (supraventricular tachycardia) ([place])   . Polymyalgia rheumatica ([place]) 08/30/2014  . Anemia 08/30/2014  . HTN (hypertension) 08/27/2014  . Chronic diastolic heart failure ([place]) 04/22/2014  . CKD (chronic kidney disease) stage 3, GFR 30-59 ml/min 02/26/2014  . Coronary artery disease involving coronary bypass graft of native heart with angina pectoris ([place]) 09/24/2013  . COPD (chronic obstructive pulmonary disease) ([place]) 09/24/2013  . Type 2 diabetes mellitus with vascular disease ([place]) 09/24/2013  . 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 Esophageal stricture 09/24/2013  . OSA (obstructive sleep apnea)   . Tobacco use disorder 07/30/2013  . Carotid artery disease ([place])   . Hyperlipidemia   . Atherosclerosis of native arteries of extremity with intermittent claudication [facility])     Past Surgical History:  Procedure Laterality Date  . ABDOMINAL AORTAGRAM N/A 11/27/2014   Procedure: ABDOMINAL Maxcine Ham;  Surgeon: Wellington Hampshire, MD;  Location: [facility] CATH LAB;  Service: Cardiovascular;  Laterality: N/A;  . ANKLE FRACTURE SURGERY Right ~ 1977   "10 plates and 7 screws placed in it"  . ANTERIOR CERVICAL DECOMP/DISCECTOMY FUSION    . BACK SURGERY     neck surgery x 2  . CARDIAC CATHETERIZATION    . CATARACT EXTRACTION W/ INTRAOCULAR LENS  IMPLANT, BILATERAL Bilateral 2000's  . COLONOSCOPY    . CORONARY ARTERY BYPASS GRAFT  1999  . CORONARY ARTERY BYPASS GRAFT N/A 03/01/2014   Procedure: REDO CORONARY ARTERY BYPASS GRAFTING TIMES THREE, USING RIGHT GREATER SAPHENOUS VEIN VIA ENDOVEIN HARVEST.;  Surgeon:  Gaye Pollack, MD;  Location: [facility] OR;  Service: Open Heart Surgery;  Laterality: N/A;  . CORONARY STENT PLACEMENT     "I've had 8 put in; I presently have none" (08/26/2014)  . CYSTOSCOPY W/ RETROGRADES Bilateral 03/03/2015   Procedure: CYSTOSCOPY WITH LEFT RETROGRADE PYELOGRAM;  Surgeon: Kathie Rhodes, MD;  Location: [facility] ORS;  Service: Urology;  Laterality: Bilateral;  . CYSTOSCOPY WITH BIOPSY N/A 04/25/2017   Procedure: CYSTOSCOPY WITH BLADDER BIOPSY;  Surgeon: Kathie Rhodes, MD;  Location: [facility] ORS;  Service: Urology;  Laterality: N/A;  . ENDARTERECTOMY Left 01/30/2015   Procedure: ENDARTERECTOMY CAROTID WITH PRIMARY CLOSURE OF ARTERY;  Surgeon: Angelia Mould, MD;  Location: [facility] OR;  Service: Vascular;  Laterality: Left;  . ESOPHAGOGASTRODUODENOSCOPY (EGD) WITH ESOPHAGEAL DILATION  X 1  . EYE SURGERY     cataract surgery bilat   . FRACTURE SURGERY Right  ankle  . INTRAOPERATIVE TRANSESOPHAGEAL ECHOCARDIOGRAM N/A 03/01/2014   Procedure: INTRAOPERATIVE TRANSESOPHAGEAL ECHOCARDIOGRAM;  Surgeon: Gaye Pollack, MD;  Location: [facility] OR;  Service: Open Heart Surgery;  Laterality: N/A;  . IR NEPHROSTOMY PLACEMENT RIGHT  05/11/2017  . IR URETERAL STENT PLACEMENT EXISTING ACCESS RIGHT  05/25/2017  . LEFT HEART CATHETERIZATION WITH CORONARY/GRAFT ANGIOGRAM  02/26/2014   Procedure: LEFT HEART CATHETERIZATION WITH Beatrix Fetters;  Surgeon: Sinclair Grooms, MD;  Location: [facility] C[facility] LAB;  Service: Cardiovascular;;  . LEFT HEART CATHETERIZATION WITH CORONARY/GRAFT ANGIOGRAM N/A 07/23/2014   Procedure: LEFT HEART CATHETERIZATION WITH Beatrix Fetters;  Surgeon: Sinclair Grooms, MD;  Location: [facility] CATH LAB;  Service: Cardiovascular;  Laterality: N/A;  . LEFT HEART CATHETERIZATION WITH CORONARY/GRAFT ANGIOGRAM N/A 08/30/2014   Procedure: LEFT HEART CATHETERIZATION WITH Beatrix Fetters;  Surgeon: Leonie Man, MD;  Location: [facility] CATH LAB;  Service: Cardiovascular;  Laterality: N/A;  .  MOHS SURGERY  X 2   "nose; nose"  . MULTIPLE TOOTH EXTRACTIONS    . SKIN CANCER EXCISION     "all over my head; ears; back  . TONSILLECTOMY    . TRANSURETHRAL RESECTION OF BLADDER TUMOR WITH GYRUS (TURBT-GYRUS) N/A 03/03/2015   Procedure: TRANSURETHRAL RESECTION OF BLADDER TUMOR;  Surgeon: Kathie Rhodes, MD;  [facility];  Service: Urology;  Laterality: N/A;  . TRANSURETHRAL RESECTION OF BLADDER TUMOR WITH GYRUS (TURBT-GYRUS) N/A 04/28/2015   Procedure: TRANSURETHRAL RESECTION OF BLADDER TUMOR ;  Surgeon: Kathie Rhodes, MD;  [facility];  Service: Urology;  Laterality: N/A;  . TRANSURETHRAL RESECTION OF BLADDER TUMOR WITH GYRUS (TURBT-GYRUS) N/A 11/24/2015   Procedure: TRANSURETHRAL RESECTION OF BLADDER TUMOR WITH GYRUS (TURBT-GYRUS);  Surgeon: Kathie Rhodes, MD;  [facility];  Service: Urology;  Laterality: N/A;  . TRANSURETHRAL RESECTION OF PROSTATE N/A 04/25/2017   Procedure: TRANSURETHRAL RESECTION OF THE PROSTATE (TURP);  Surgeon: Kathie Rhodes, MD;  [facility] ORS;  Service: Urology;  Laterality: N/A;       Home Medications    Prior to Admission medications   Medication Sig Start Date End Date Taking? Authorizing Provider  amLODipine (NORVASC) 10 MG tablet Take 10 mg by mouth every morning.    Yes [provider]  atorvastatin (LIPITOR) 20 MG tablet Take 20 mg by mouth daily at 6 PM.    Yes [provider]  cholecalciferol (VITAMIN D) 400 units TABS tablet Take 400 Units by mouth at bedtime.    Yes [provider]  docusate sodium (COLACE) 100 MG capsule Take 1 capsule (100 mg total) by mouth 2 (two) times daily. 07/08/17  Yes Allie Bossier, MD  Ferrous Gluconate (IRON 27 PO) Take 1 tablet by mouth every evening.   Yes [provider]  haloperidol (HALDOL) 1 MG tablet Take 1 mg by mouth every 8 (eight) hours as needed for agitation.   Yes [provider]  hydrALAZINE (APRESOLINE) 50 MG tablet Take 1 tablet (50 mg total) by mouth  every 6 (six) hours. 07/08/17  Yes Allie Bossier, MD  isosorbide mononitrate (IMDUR) 120 MG 24 hr tablet Take 1 tablet (120 mg total) by mouth daily. 01/31/17  Yes Belva Crome, MD  LANTUS SOLOSTAR 100 UNIT/ML Solostar Pen Inject 14 Units into the skin daily. 07/08/17  Yes Allie Bossier, MD  LORazepam (ATIVAN) 0.5 MG tablet Take 0.5 mg by mouth every 4 (four) hours as needed for anxiety or sleep.   Yes [provider]  magnesium oxide (  MAG-OX) 400 (241.3 Mg) MG tablet Take 400 mg by mouth every evening.    Yes [provider]  metoprolol tartrate (LOPRESSOR) 100 MG tablet Take 0.5 tablets (50 mg total) by mouth 2 (two) times daily. 03/22/17  Yes Wellington Hampshire, MD  Misc Natural Products (TART CHERRY ADVANCED) CAPS Take 1 capsule by mouth daily.   Yes [provider]  pantoprazole (PROTONIX) 40 MG tablet Take 1 tablet (40 mg total) by mouth daily. Patient taking differently: Take 40 mg by mouth every other day.  03/07/17  Yes Belva Crome, MD  ranolazine (RANEXA) 500 MG 12 hr tablet Take 1 tablet (500 mg total) by mouth 2 (two) times daily. 07/08/17  Yes Allie Bossier, MD  torsemide (DEMADEX) 20 MG tablet Take 1 tablet (20 mg total) by mouth daily. 07/08/17  Yes Allie Bossier, MD  traMADol (ULTRAM) 50 MG tablet Take 1 tablet (50 mg total) by mouth every 6 (six) hours as needed for moderate pain. 07/08/17  Yes Allie Bossier, MD  aspirin EC 81 MG EC tablet Take 1 tablet (81 mg total) by mouth daily. Patient not taking: Reported on 07/11/2017 11/19/14   Wellington Hampshire, MD  clopidogrel (PLAVIX) 75 MG tablet TAKE 1 TABLET BY MOUTH DAILY Patient not taking: Reported on 07/11/2017 02/23/17   Belva Crome, MD  HYDROcodone-acetaminophen [facility]) 10-325 MG tablet Take 1-2 tablets by mouth every 4 (four) hours as needed for moderate pain. Maximum dose per 24 hours - 8 pills Patient not taking: Reported on 07/01/2017 04/25/17   Kathie Rhodes, MD  insulin aspart (NOVOLOG FLEXPEN)  100 UNIT/ML FlexPen Inject 5 Units into the skin 3 (three) times daily with meals. Patient not taking: Reported on 07/11/2017 07/08/17   Allie Bossier, MD  Insulin Pen Needle (PEN NEEDLES 31GX5/16") 31G X 8 MM MISC 5 Units by Does not apply route 3 (three) times daily with meals. 07/08/17   Allie Bossier, MD  nitroGLYCERIN (NITROSTAT) 0.4 MG SL tablet PLACE 1 TABLET UNDER THE TONGUE EVERY 5 MINUTES AS NEEDED FOR CHEST PAIN 08/27/16   Belva Crome, MD    Family History Family History  Problem Relation Age of Onset  . CAD Mother   . Heart disease Mother   . Hypertension Mother   . Cirrhosis Brother   . Heart disease Father   . Hyperlipidemia Father   . Hypertension Father   . Heart attack Father     Social History Social History  Substance Use Topics  . Smoking status: Former Smoker    Packs/day: 2.00    Years: 50.00    Types: Cigarettes    Quit date: 09/18/2015  . Smokeless tobacco: Never Used  . 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  Gastrointestinal: Negative for abdominal distention, abdominal pain and nausea.  Genitourinary: Positive for difficulty urinating and hematuria.  All other systems reviewed and are negative.    Physical Exam Updated Vital Signs BP 140/65   Pulse (!) 58   Temp 97.9 F (36.6 C) (Oral)   Resp 16   SpO2 96%   Physical Exam  Constitutional: He appears well-developed and well-nourished. No distress.  HENT:  Head: Normocephalic and atraumatic.  Eyes: Conjunctivae are normal.  Cardiovascular: Normal rate and intact  distal pulses.   Pulmonary/Chest: Effort normal. No respiratory distress.  Abdominal: Soft. Bowel sounds are normal. He exhibits no distension. There is tenderness (very mild TTP suprapubic).  Neurological: He is alert.  Skin: Skin is warm.  Psychiatric: He has  a normal mood and affect. His behavior is normal.  Nursing note and vitals reviewed.    ED Treatments / Results  Labs (all labs ordered are listed, but only abnormal results are displayed) Labs Reviewed  CBC WITH DIFFERENTIAL/PLATELET - Abnormal; Notable for the following:       Result Value   WBC 12.7 (*)    RBC 2.97 (*)    Hemoglobin 8.3 (*)    HCT 24.1 (*)    Neutro Abs 9.4 (*)    Monocytes Absolute 1.3 (*)    All other components within normal limits  BASIC METABOLIC PANEL - Abnormal; Notable for the following:    Sodium 134 (*)    Glucose, Bld 147 (*)    BUN 52 (*)    Creatinine, Ser 2.44 (*)    Calcium 8.7 (*)    GFR calc non Af Amer 24 (*)    GFR calc Af Amer 28 (*)    All other components within normal limits  I-STAT CHEM 8, ED - Abnormal; Notable for the following:    Chloride 100 (*)    BUN 49 (*)    Creatinine, Ser 2.50 (*)    Glucose, Bld 153 (*)    Hemoglobin 6.5 (*)    HCT 19.0 (*)    All other components within normal limits  PROTIME-INR  TYPE AND SCREEN  PREPARE RBC (CROSSMATCH)    EKG  EKG Interpretation None       Radiology No results found.  Procedures Procedures (including critical care time)  Medications Ordered in ED Medications  0.9 %  sodium chloride infusion (not administered)     Initial Impression / Assessment and Plan / ED Course  I have reviewed the triage vital signs and the nursing notes.  Pertinent labs & imaging results that were available during my care of the patient were reviewed by me and considered in my medical decision making (see chart for details).     Pt presenting for subsequent visit for foley catheter blockage 2/t chronic hematuria. Pt with oncology appointment in the morning to discuss palliative radiation for hematuria, and on hospice. Pt w 3-way foley. Pt's foley began draining en route, and continues to drain during ED course. Pt was taking plavix and ASA daily for CAD and afib, however stopped  yesterday. Minimal suprapubic tenderness on exam. U/A done yesterday without evidence of UTI. Continuous bladder irrigation done to clear any remaining clots. Chem 8 showing hgb 6.5, down from 9.0 on 07/07/17. Cr at baseline. Had discussion with patient about his DNR status and states he will accept blood products at this time. CBC and BMP ordered. Type and screen and crossmatch ordered. CBC showing hgb 8.3. Plan to administer 1 unit of blood, and discharge home. Care assumed by David Lange, PA-C at shift change.   Patient discussed with and seen by Dr. Ellender Hose, as well as discussed with Dr. Rolland Porter.    Final Clinical Impressions(s) / ED Diagnoses   Final diagnoses:  Complication of Foley catheter, subsequent encounter  Urinary retention  Low hemoglobin    New Prescriptions New Prescriptions   No medications on file     Russo, [place] N, PA-C 07/12/17 0118    Duffy Bruce, MD 07/12/17  1021  

## 2017-07-11 NOTE — ED Provider Notes (Signed)
[facility] DEPT Provider Note   CSN: [id number] Arrival date & time: 07/11/17  0215     History   Chief Complaint Chief Complaint  Patient presents with  . Urinary Retention    HPI [name] is a 76 y.o. male.  Patient with history of bladder CA (chronic indwelling foley, currently 18Fr, on Hospice), HTN, CKD, CAD, COPD, atrial fibrillation presents with recurrent obstruction of foley catheter. Per wife, the home health nurse was able to irrigate the foley yesterday but it occluded again later in the day. The patient has chronic hematuria with CA and it was felt a larger catheter might prevent recurrent problems but she did not have one available so the patient was sent here for further management. He has pain in his lower abdomen and feels his bladder is full. No fever, vomiting.    The history is provided by the patient and the spouse. No language interpreter was used.    Past Medical History:  Diagnosis Date  . Anemia    a. + FOBT 08/2014.  Marland Kitchen Anginal pain ([place])    comes and goes; uses nitro to relieve   . Arthritis   . Atherosclerosis of native arteries of the extremities with intermittent claudication    a. L external iliac stent 2001, bilateral SFA occlusions documented 2001.  . Atrial fibrillation ([place])   . Bladder cancer ([place])   . CAD (coronary artery disease), native coronary artery 09/24/2013   a. CABG x 6 1999. b. Interim PCIs: Angioplasty SVG-PDA 2006.Taxus DES to native LCx in 2007.DES to SVG-PDA 2007, restented 2009.NSTEMI with PCI to SVG- PDA complicated by no-reflow/inferior infarction in 2012. c. Redo CABG 2015 with SVG-PD, SVG-D1, SVG-OM1.  . Carotid artery occlusion    a. Duplex 12/2013: mild plaque bilat, 40-59% BICA. F/u due 12/2014.  Marland Kitchen Chronic combined systolic and diastolic CHF (congestive heart failure) ([place])    a. Prior EF 45-50% in 08/2014. b. Improved EF >55% in 09/2014 at [facility].  . Chronic lower back pain   . CKD (chronic kidney disease)  stage 3, GFR 30-59 ml/min   . COPD (chronic obstructive pulmonary disease) ([place])    pt states he doesn't have  . Daily headache    comes and goes pt denies daily currently   . Dysrhythmia   . Esophageal stricture    a. History of dilitation - Long-standing history of esophageal reflux   . Gait instability   . GERD (gastroesophageal reflux disease)   . Gout   . History of bronchitis   . History of gout   . History of hiatal hernia   . Hx of CABG 09/24/2013   a. 1999: LIMA to LAD, sequential SVG to diagonal 2 and diagonal 3, sequential SVG to PDA, acute marginal branch, and PL branch. b. Repeat CABG 2015 with SVG to D1, SVG to PDA, and SVG to OM.   Marland Kitchen Hyperlipidemia   . Hypertension   . Hypotonic bladder    Requiring in and out catheterization performed by the patient at home - occasional blood tinged urine (self-cath's daily for the last 2 years) and hematuria is chronic for him, followed by urology.  . MI (myocardial infarction) ([place]) 1999-02/2014   total of 7 MI per patient  . OSA (obstructive sleep apnea)    "don't wear mask"   . Polymyalgia rheumatica ([place])   . Self-catheterizes urinary bladder   . Shortness of breath dyspnea   . Skin cancer   . SVT (supraventricular tachycardia) ([place])   .  Type II diabetes mellitus [facility])     Patient Active Problem List   Diagnosis Date Noted  . DNR (do not resuscitate) discussion   . Palliative care by specialist   . Renal failure 07/01/2017  . AKI (acute kidney injury) ([place]) 07/01/2017  . Hyperkalemia 07/01/2017  . Troponin level elevated 07/01/2017  . Malignant neoplasm of urinary bladder ([place]) 06/29/2017  . Old MI (myocardial infarction) 08/01/2016  . Chest pain   . Hematuria 12/18/2015  . SVT (supraventricular tachycardia) ([place])   . Polymyalgia rheumatica ([place]) 08/30/2014  . Anemia 08/30/2014  . HTN (hypertension) 08/27/2014  . Chronic diastolic heart failure ([place]) 04/22/2014  . CKD (chronic kidney disease) stage 3, GFR 30-59  ml/min 02/26/2014  . Coronary artery disease involving coronary bypass graft of native heart with angina pectoris ([place]) 09/24/2013  . COPD (chronic obstructive pulmonary disease) ([place]) 09/24/2013  . Type 2 diabetes mellitus with vascular disease ([place]) 09/24/2013  . 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 Esophageal stricture 09/24/2013  . OSA (obstructive sleep apnea)   . Tobacco use disorder 07/30/2013  . Carotid artery disease ([place])   . Hyperlipidemia   . Atherosclerosis of native arteries of extremity with intermittent claudication [facility])     Past Surgical History:  Procedure Laterality Date  . ABDOMINAL AORTAGRAM N/A 11/27/2014   Procedure: ABDOMINAL Maxcine Ham;  Surgeon: Wellington Hampshire, MD;  Location: [facility] CATH LAB;  Service: Cardiovascular;  Laterality: N/A;  . ANKLE FRACTURE SURGERY Right ~ 1977   "10 plates and 7 screws placed in it"  . ANTERIOR CERVICAL DECOMP/DISCECTOMY FUSION    . BACK SURGERY     neck surgery x 2  . CARDIAC CATHETERIZATION    . CATARACT EXTRACTION W/ INTRAOCULAR LENS  IMPLANT, BILATERAL Bilateral 2000's  . COLONOSCOPY    . CORONARY ARTERY BYPASS GRAFT  1999  . CORONARY ARTERY BYPASS GRAFT N/A 03/01/2014   Procedure: REDO CORONARY ARTERY BYPASS GRAFTING TIMES THREE, USING RIGHT GREATER SAPHENOUS VEIN VIA ENDOVEIN HARVEST.;  Surgeon: Gaye Pollack, MD;  Location: [facility] OR;  Service: Open Heart Surgery;  Laterality: N/A;  . CORONARY STENT PLACEMENT     "I've had 8 put in; I presently have none" (08/26/2014)  . CYSTOSCOPY W/ RETROGRADES Bilateral 03/03/2015   Procedure: CYSTOSCOPY WITH LEFT RETROGRADE PYELOGRAM;  Surgeon: Kathie Rhodes, MD;  Location: [facility] ORS;  Service: Urology;  Laterality: Bilateral;  . CYSTOSCOPY WITH BIOPSY N/A 04/25/2017   Procedure: CYSTOSCOPY WITH BLADDER BIOPSY;  Surgeon: Kathie Rhodes, MD;  Location: [facility] ORS;  Service: Urology;  Laterality: N/A;  . ENDARTERECTOMY Left 01/30/2015   Procedure: ENDARTERECTOMY CAROTID WITH PRIMARY CLOSURE OF ARTERY;   Surgeon: Angelia Mould, MD;  Location: [facility] OR;  Service: Vascular;  Laterality: Left;  . ESOPHAGOGASTRODUODENOSCOPY (EGD) WITH ESOPHAGEAL DILATION  X 1  . EYE SURGERY     cataract surgery bilat   . FRACTURE SURGERY Right    ankle  . INTRAOPERATIVE TRANSESOPHAGEAL ECHOCARDIOGRAM N/A 03/01/2014   Procedure: INTRAOPERATIVE TRANSESOPHAGEAL ECHOCARDIOGRAM;  Surgeon: Gaye Pollack, MD;  Location: [facility] OR;  Service: Open Heart Surgery;  Laterality: N/A;  . IR NEPHROSTOMY PLACEMENT RIGHT  05/11/2017  . IR URETERAL STENT PLACEMENT EXISTING ACCESS RIGHT  05/25/2017  . LEFT HEART CATHETERIZATION WITH CORONARY/GRAFT ANGIOGRAM  02/26/2014   Procedure: LEFT HEART CATHETERIZATION WITH Beatrix Fetters;  Surgeon: Sinclair Grooms, MD;  Location: [facility] CATH LAB;  Service: Cardiovascular;;  . LEFT HEART CATHETERIZATION WITH CORONARY/GRAFT ANGIOGRAM N/A 07/23/2014   Procedure: LEFT HEART CATHETERIZATION  WITH Beatrix Fetters;  Surgeon: Sinclair Grooms, MD;  Location: [facility] C[facility] LAB;  Service: Cardiovascular;  Laterality: N/A;  . LEFT HEART CATHETERIZATION WITH CORONARY/GRAFT ANGIOGRAM N/A 08/30/2014   Procedure: LEFT HEART CATHETERIZATION WITH Beatrix Fetters;  Surgeon: Leonie Man, MD;  Location: [facility] CATH LAB;  Service: Cardiovascular;  Laterality: N/A;  . MOHS SURGERY  X 2   "nose; nose"  . MULTIPLE TOOTH EXTRACTIONS    . SKIN CANCER EXCISION     "all over my head; ears; back  . TONSILLECTOMY    . TRANSURETHRAL RESECTION OF BLADDER TUMOR WITH GYRUS (TURBT-GYRUS) N/A 03/03/2015   Procedure: TRANSURETHRAL RESECTION OF BLADDER TUMOR;  Surgeon: Kathie Rhodes, MD;  [facility];  Service: Urology;  Laterality: N/A;  . TRANSURETHRAL RESECTION OF BLADDER TUMOR WITH GYRUS (TURBT-GYRUS) N/A 04/28/2015   Procedure: TRANSURETHRAL RESECTION OF BLADDER TUMOR ;  Surgeon: Kathie Rhodes, MD;  [facility];  Service: Urology;  Laterality: N/A;  . TRANSURETHRAL RESECTION OF BLADDER TUMOR WITH  GYRUS (TURBT-GYRUS) N/A 11/24/2015   Procedure: TRANSURETHRAL RESECTION OF BLADDER TUMOR WITH GYRUS (TURBT-GYRUS);  Surgeon: Kathie Rhodes, MD;  [facility];  Service: Urology;  Laterality: N/A;  . TRANSURETHRAL RESECTION OF PROSTATE N/A 04/25/2017   Procedure: TRANSURETHRAL RESECTION OF THE PROSTATE (TURP);  Surgeon: Kathie Rhodes, MD;  [facility]S;  Service: Urology;  Laterality: N/A;       Home Medications    Prior to Admission medications   Medication Sig Start Date End Date Taking? Authorizing Provider  amLODipine (NORVASC) 10 MG tablet Take 10 mg by mouth every morning.     [provider]  aspirin EC 81 MG EC tablet Take 1 tablet (81 mg total) by mouth daily. 11/19/14   Wellington Hampshire, MD  atorvastatin (LIPITOR) 20 MG tablet Take 20 mg by mouth daily at 6 PM.     [provider]  cholecalciferol (VITAMIN D) 400 units TABS tablet Take 400 Units by mouth at bedtime.     [provider]  clopidogrel (PLAVIX) 75 MG tablet TAKE 1 TABLET BY MOUTH DAILY 02/23/17   Belva Crome, MD  docusate sodium (COLACE) 100 MG capsule Take 1 capsule (100 mg total) by mouth 2 (two) times daily. 07/08/17   Allie Bossier, MD  Ferrous Gluconate (IRON 27 PO) Take 1 tablet by mouth every evening.    [provider]  hydrALAZINE (APRESOLINE) 50 MG tablet Take 1 tablet (50 mg total) by mouth every 6 (six) hours. 07/08/17   Allie Bossier, MD  HYDROcodone-acetaminophen [facility]) 10-325 MG tablet Take 1-2 tablets by mouth every 4 (four) hours as needed for moderate pain. Maximum dose per 24 hours - 8 pills Patient not taking: Reported on 07/01/2017 04/25/17   Kathie Rhodes, MD  insulin aspart (NOVOLOG FLEXPEN) 100 UNIT/ML FlexPen Inject 5 Units into the skin 3 (three) times daily with meals. 07/08/17   Allie Bossier, MD  Insulin Pen Needle (PEN NEEDLES 31GX5/16") 31G X 8 MM MISC 5 Units by Does not apply route 3 (three) times daily with meals. 07/08/17   Allie Bossier, MD    isosorbide mononitrate (IMDUR) 120 MG 24 hr tablet Take 1 tablet (120 mg total) by mouth daily. 01/31/17   Belva Crome, MD  LANTUS SOLOSTAR 100 UNIT/ML Solostar Pen Inject 14 Units into the skin daily. 07/08/17   Allie Bossier, MD  magnesium oxide (MAG-OX) 400 (241.3 Mg) MG tablet Take 400 mg by mouth  every evening.     [provider]  metoprolol tartrate (LOPRESSOR) 100 MG tablet Take 0.5 tablets (50 mg total) by mouth 2 (two) times daily. 03/22/17   Wellington Hampshire, MD  nitroGLYCERIN (NITROSTAT) 0.4 MG SL tablet PLACE 1 TABLET UNDER THE TONGUE EVERY 5 MINUTES AS NEEDED FOR CHEST PAIN 08/27/16   Belva Crome, MD  pantoprazole (PROTONIX) 40 MG tablet Take 1 tablet (40 mg total) by mouth daily. Patient taking differently: Take 40 mg by mouth every other day.  03/07/17   Belva Crome, MD  ranolazine (RANEXA) 500 MG 12 hr tablet Take 1 tablet (500 mg total) by mouth 2 (two) times daily. 07/08/17   Allie Bossier, MD  torsemide (DEMADEX) 20 MG tablet Take 1 tablet (20 mg total) by mouth daily. 07/08/17   Allie Bossier, MD  traMADol (ULTRAM) 50 MG tablet Take 1 tablet (50 mg total) by mouth every 6 (six) hours as needed for moderate pain. 07/08/17   Allie Bossier, MD    Family History Family History  Problem Relation Age of Onset  . CAD Mother   . Heart disease Mother   . Hypertension Mother   . Cirrhosis Brother   . Heart disease Father   . Hyperlipidemia Father   . Hypertension Father   . Heart attack Father     Social History Social History  Substance Use Topics  . Smoking status: Former Smoker    Packs/day: 2.00    Years: 50.00    Types: Cigarettes    Quit date: 09/18/2015  . Smokeless tobacco: Never Used  . 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  Respiratory: Negative for shortness of breath.    Cardiovascular: Negative for chest pain.  Gastrointestinal: Positive for abdominal pain (See HPI.). Negative for nausea and vomiting.  Genitourinary: Positive for difficulty urinating and hematuria. Negative for scrotal swelling and testicular pain.  Musculoskeletal: Negative for back pain and myalgias.  Neurological: Negative for syncope, weakness and light-headedness.     Physical Exam Updated Vital Signs BP (!) 164/58 (BP Location: Left Arm)   Pulse 70   Temp 98 F (36.7 C) (Oral)   Resp 19   Ht 5\' 6"  (1.676 m)   Wt 70.3 kg (155 lb)   SpO2 98%   BMI 25.02 kg/m   Physical Exam  Constitutional: He is oriented to person, place, and time. He appears well-developed and well-nourished.  Neck: Normal range of motion.  Pulmonary/Chest: Effort normal.  Abdominal: Soft. There is tenderness. There is no guarding.  Suprapubic tenderness without significant distention.  Musculoskeletal: Normal range of motion.  Neurological: He is alert and oriented to person, place, and time.  Skin: Skin is warm and dry.  Psychiatric: He has a normal mood and affect.     ED Treatments / Results  Labs (all labs ordered are listed, but only abnormal results are displayed) Labs Reviewed  URINALYSIS, ROUTINE W REFLEX MICROSCOPIC - Abnormal; Notable for the following:       Result Value   Color, Urine BROWN (*)    APPearance CLOUDY (*)    Hgb urine dipstick LARGE (*)    Bilirubin Urine SMALL (*)    Protein, ur >300 (*)    Nitrite POSITIVE (*)  Leukocytes, UA MODERATE (*)    All other components within normal limits  URINALYSIS, MICROSCOPIC (REFLEX) - Abnormal; Notable for the following:    Bacteria, UA FEW (*)    All other components within normal limits  URINE CULTURE    EKG  EKG Interpretation None       Radiology No results found.  Procedures Procedures (including critical care time)  Medications Ordered in ED Medications - No data to display   Initial Impression /  Assessment and Plan / ED Course  I have reviewed the triage vital signs and the nursing notes.  Pertinent labs & imaging results that were available during my care of the patient were reviewed by me and considered in my medical decision making (see chart for details).     Foley draining significantly bloody urine, approximately 100 cc in drainage bag. Per patient and wife, this is a much smaller amount than usual and abnormal for the amount of time since irrigation yesterday.  Will switch the foley to a larger tube as per stated plan of home health per patient.   Foley catheter changed to 22Fr. Manual irrigation performed with good return. Patient is much more comfortable. He can be discharged home. Return precautions discussed. Per wife, the patient has urology follow up scheduled for this week.   Final Clinical Impressions(s) / ED Diagnoses   Final diagnoses:  None   1. Foley catheter replacement 2. Urinary retention   New Prescriptions New Prescriptions   No medications on file     Charlann Lange, Hershal Coria 07/11/17 0535    Veryl Speak, MD 07/11/17 [phone]

## 2017-07-11 NOTE — ED Triage Notes (Signed)
Pt coming from home with complaints of urinary retention. Seen yesterday for same. Pt has Hx of bladder cancer. Foley replaced last night at [facility], and again earlier today by hospice nurse. DNR form with patient.

## 2017-07-11 NOTE — ED Triage Notes (Signed)
Pt coming from home with complaints of urinary retention. Pt has HX of bladder cancer, and has catheter in place. PT A&O x4.  177/58 HR 74

## 2017-07-11 NOTE — Discharge Instructions (Signed)
Keep your scheduled appointment with urology this week. Return here if you develop any concerning symptoms including fever, further obstruction of the urinary catheter or abdominal pain.

## 2017-07-11 NOTE — ED Notes (Signed)
Bladder scan volume: 53 mL.

## 2017-07-12 ENCOUNTER — Encounter (HOSPITAL_COMMUNITY): Payer: Self-pay

## 2017-07-12 ENCOUNTER — Ambulatory Visit: Admitting: Radiation Oncology

## 2017-07-12 ENCOUNTER — Ambulatory Visit
Admit: 2017-07-12 | Discharge: 2017-07-12 | Disposition: A | Discharge status: Home / Self Care | Attending: Radiation Oncology | Admitting: Radiation Oncology

## 2017-07-12 ENCOUNTER — Other Ambulatory Visit: Payer: Medicare Other

## 2017-07-12 ENCOUNTER — Ambulatory Visit: Payer: Medicare Other | Admitting: Oncology

## 2017-07-12 ENCOUNTER — Ambulatory Visit: Payer: Medicare Other

## 2017-07-12 ENCOUNTER — Ambulatory Visit
Admit: 2017-07-12 | Discharge: 2017-07-12 | Disposition: A | Discharge status: Home / Self Care | Attending: Urology | Admitting: Urology

## 2017-07-12 DIAGNOSIS — Y738 Miscellaneous gastroenterology and urology devices associated with adverse incidents, not elsewhere classified: Secondary | ICD-10-CM | POA: Diagnosis present

## 2017-07-12 DIAGNOSIS — R319 Hematuria, unspecified: Secondary | ICD-10-CM | POA: Diagnosis not present

## 2017-07-12 DIAGNOSIS — E785 Hyperlipidemia, unspecified: Secondary | ICD-10-CM | POA: Diagnosis present

## 2017-07-12 DIAGNOSIS — Z66 Do not resuscitate: Secondary | ICD-10-CM | POA: Diagnosis present

## 2017-07-12 DIAGNOSIS — N189 Chronic kidney disease, unspecified: Secondary | ICD-10-CM | POA: Diagnosis not present

## 2017-07-12 DIAGNOSIS — I25709 Atherosclerosis of coronary artery bypass graft(s), unspecified, with unspecified angina pectoris: Secondary | ICD-10-CM | POA: Diagnosis not present

## 2017-07-12 DIAGNOSIS — M353 Polymyalgia rheumatica: Secondary | ICD-10-CM | POA: Diagnosis present

## 2017-07-12 DIAGNOSIS — D72829 Elevated white blood cell count, unspecified: Secondary | ICD-10-CM | POA: Diagnosis present

## 2017-07-12 DIAGNOSIS — G4700 Insomnia, unspecified: Secondary | ICD-10-CM | POA: Diagnosis present

## 2017-07-12 DIAGNOSIS — D62 Acute posthemorrhagic anemia: Secondary | ICD-10-CM | POA: Diagnosis not present

## 2017-07-12 DIAGNOSIS — N183 Chronic kidney disease, stage 3 (moderate): Secondary | ICD-10-CM | POA: Diagnosis not present

## 2017-07-12 DIAGNOSIS — N312 Flaccid neuropathic bladder, not elsewhere classified: Secondary | ICD-10-CM | POA: Diagnosis present

## 2017-07-12 DIAGNOSIS — T83091D Other mechanical complication of indwelling urethral catheter, subsequent encounter: Secondary | ICD-10-CM | POA: Diagnosis not present

## 2017-07-12 DIAGNOSIS — I5042 Chronic combined systolic (congestive) and diastolic (congestive) heart failure: Secondary | ICD-10-CM | POA: Diagnosis not present

## 2017-07-12 DIAGNOSIS — N390 Urinary tract infection, site not specified: Secondary | ICD-10-CM | POA: Diagnosis not present

## 2017-07-12 DIAGNOSIS — I2510 Atherosclerotic heart disease of native coronary artery without angina pectoris: Secondary | ICD-10-CM | POA: Diagnosis present

## 2017-07-12 DIAGNOSIS — C670 Malignant neoplasm of trigone of bladder: Secondary | ICD-10-CM

## 2017-07-12 DIAGNOSIS — J449 Chronic obstructive pulmonary disease, unspecified: Secondary | ICD-10-CM | POA: Diagnosis present

## 2017-07-12 DIAGNOSIS — E1122 Type 2 diabetes mellitus with diabetic chronic kidney disease: Secondary | ICD-10-CM | POA: Diagnosis present

## 2017-07-12 DIAGNOSIS — C679 Malignant neoplasm of bladder, unspecified: Secondary | ICD-10-CM

## 2017-07-12 DIAGNOSIS — I4891 Unspecified atrial fibrillation: Secondary | ICD-10-CM | POA: Diagnosis present

## 2017-07-12 DIAGNOSIS — Z85828 Personal history of other malignant neoplasm of skin: Secondary | ICD-10-CM | POA: Diagnosis not present

## 2017-07-12 DIAGNOSIS — N179 Acute kidney failure, unspecified: Secondary | ICD-10-CM | POA: Diagnosis not present

## 2017-07-12 DIAGNOSIS — Z436 Encounter for attention to other artificial openings of urinary tract: Secondary | ICD-10-CM | POA: Diagnosis not present

## 2017-07-12 DIAGNOSIS — C7919 Secondary malignant neoplasm of other urinary organs: Secondary | ICD-10-CM | POA: Diagnosis not present

## 2017-07-12 DIAGNOSIS — G4733 Obstructive sleep apnea (adult) (pediatric): Secondary | ICD-10-CM | POA: Diagnosis present

## 2017-07-12 DIAGNOSIS — K219 Gastro-esophageal reflux disease without esophagitis: Secondary | ICD-10-CM | POA: Diagnosis present

## 2017-07-12 DIAGNOSIS — T839XXD Unspecified complication of genitourinary prosthetic device, implant and graft, subsequent encounter: Secondary | ICD-10-CM | POA: Diagnosis not present

## 2017-07-12 DIAGNOSIS — Z466 Encounter for fitting and adjustment of urinary device: Secondary | ICD-10-CM | POA: Diagnosis not present

## 2017-07-12 DIAGNOSIS — R339 Retention of urine, unspecified: Secondary | ICD-10-CM | POA: Diagnosis present

## 2017-07-12 DIAGNOSIS — Z510 Encounter for antineoplastic radiation therapy: Secondary | ICD-10-CM | POA: Insufficient documentation

## 2017-07-12 DIAGNOSIS — I252 Old myocardial infarction: Secondary | ICD-10-CM | POA: Diagnosis not present

## 2017-07-12 DIAGNOSIS — N139 Obstructive and reflux uropathy, unspecified: Secondary | ICD-10-CM | POA: Diagnosis present

## 2017-07-12 DIAGNOSIS — I130 Hypertensive heart and chronic kidney disease with heart failure and stage 1 through stage 4 chronic kidney disease, or unspecified chronic kidney disease: Secondary | ICD-10-CM | POA: Diagnosis not present

## 2017-07-12 DIAGNOSIS — N131 Hydronephrosis with ureteral stricture, not elsewhere classified: Secondary | ICD-10-CM | POA: Diagnosis present

## 2017-07-12 DIAGNOSIS — R310 Gross hematuria: Secondary | ICD-10-CM | POA: Diagnosis not present

## 2017-07-12 DIAGNOSIS — F419 Anxiety disorder, unspecified: Secondary | ICD-10-CM | POA: Diagnosis present

## 2017-07-12 LAB — GLUCOSE, CAPILLARY
GLUCOSE-CAPILLARY: 171 mg/dL — AB (ref 65–99)
GLUCOSE-CAPILLARY: 197 mg/dL — AB (ref 65–99)
Glucose-Capillary: 307 mg/dL — ABNORMAL HIGH (ref 65–99)
Glucose-Capillary: 130 mg/dL — ABNORMAL HIGH (ref 65–99)

## 2017-07-12 LAB — CBC WITH DIFFERENTIAL/PLATELET
BASOS ABS: 0 10*3/uL (ref 0.0–0.1)
Basophils Relative: 0 %
Eosinophils Absolute: 0.2 10*3/uL (ref 0.0–0.7)
Eosinophils Relative: 2 %
HEMATOCRIT: 24.1 % — AB (ref 39.0–52.0)
Hemoglobin: 8.3 g/dL — ABNORMAL LOW (ref 13.0–17.0)
LYMPHS ABS: 1.7 10*3/uL (ref 0.7–4.0)
LYMPHS PCT: 13 %
MCH: 27.9 pg (ref 26.0–34.0)
MCHC: 34.4 g/dL (ref 30.0–36.0)
MCV: 81.1 fL (ref 78.0–100.0)
MONO ABS: 1.3 10*3/uL — AB (ref 0.1–1.0)
MONOS PCT: 11 %
NEUTROS ABS: 9.4 10*3/uL — AB (ref 1.7–7.7)
Neutrophils Relative %: 74 %
Platelets: 280 10*3/uL (ref 150–400)
RBC: 2.97 MIL/uL — ABNORMAL LOW (ref 4.22–5.81)
RDW: 14.8 % (ref 11.5–15.5)
WBC: 12.7 10*3/uL — ABNORMAL HIGH (ref 4.0–10.5)

## 2017-07-12 LAB — CBC
HCT: 27.3 % — ABNORMAL LOW (ref 39.0–52.0)
Hemoglobin: 9.3 g/dL — ABNORMAL LOW (ref 13.0–17.0)
MCH: 27.5 pg (ref 26.0–34.0)
MCHC: 34.1 g/dL (ref 30.0–36.0)
MCV: 80.8 fL (ref 78.0–100.0)
PLATELETS: 268 10*3/uL (ref 150–400)
RBC: 3.38 MIL/uL — AB (ref 4.22–5.81)
RDW: 14.5 % (ref 11.5–15.5)
WBC: 12 10*3/uL — AB (ref 4.0–10.5)

## 2017-07-12 LAB — BASIC METABOLIC PANEL
ANION GAP: 8 (ref 5–15)
BUN: 52 mg/dL — ABNORMAL HIGH (ref 6–20)
CALCIUM: 8.7 mg/dL — AB (ref 8.9–10.3)
CO2: 25 mmol/L (ref 22–32)
Chloride: 101 mmol/L (ref 101–111)
Creatinine, Ser: 2.44 mg/dL — ABNORMAL HIGH (ref 0.61–1.24)
GFR, EST AFRICAN AMERICAN: 28 mL/min — AB (ref >60)
GFR, EST NON AFRICAN AMERICAN: 24 mL/min — AB (ref >60)
Glucose, Bld: 147 mg/dL — ABNORMAL HIGH (ref 65–99)
Potassium: 4.3 mmol/L (ref 3.5–5.1)
Sodium: 134 mmol/L — ABNORMAL LOW (ref 135–145)

## 2017-07-12 LAB — I-STAT CHEM 8, ED
BUN: 49 mg/dL — AB (ref 6–20)
CHLORIDE: 100 mmol/L — AB (ref 101–111)
CREATININE: 2.5 mg/dL — AB (ref 0.61–1.24)
Calcium, Ion: 1.19 mmol/L (ref 1.15–1.40)
Glucose, Bld: 153 mg/dL — ABNORMAL HIGH (ref 65–99)
HCT: 19 % — ABNORMAL LOW (ref 39.0–52.0)
HEMOGLOBIN: 6.5 g/dL — AB (ref 13.0–17.0)
POTASSIUM: 4.4 mmol/L (ref 3.5–5.1)
Sodium: 135 mmol/L (ref 135–145)
TCO2: 25 mmol/L (ref 22–32)

## 2017-07-12 LAB — PREPARE RBC (CROSSMATCH)

## 2017-07-12 LAB — URINE CULTURE: Culture: NO GROWTH

## 2017-07-12 LAB — PROTIME-INR
INR: 1.07
Prothrombin Time: 13.8 seconds (ref 11.4–15.2)

## 2017-07-12 MED ORDER — BOOST / RESOURCE BREEZE PO LIQD: 1.0000 | Freq: Three times a day (TID) | ORAL | Status: DC

## 2017-07-12 MED ORDER — ISOSORBIDE MONONITRATE ER 60 MG PO TB24
120.0000 mg | ORAL_TABLET | Freq: Every day | ORAL | Status: DC
  Administered 2017-07-12 – 2017-07-14 (×3): 120 mg via ORAL
  Filled 2017-07-12 (×4): qty 2

## 2017-07-12 MED ORDER — DEXTROSE 5 % IV SOLN: 1.0000 g | INTRAVENOUS | Status: DC

## 2017-07-12 MED ORDER — ACETAMINOPHEN 325 MG PO TABS: 650.0000 mg | ORAL_TABLET | Freq: Four times a day (QID) | ORAL | Status: DC | PRN

## 2017-07-12 MED ORDER — CEFEPIME HCL 2 G IJ SOLR
2.0000 g | Freq: Once | INTRAMUSCULAR | Status: AC
  Administered 2017-07-12: 2 g via INTRAVENOUS
  Filled 2017-07-12: qty 2

## 2017-07-12 MED ORDER — ACETAMINOPHEN 650 MG RE SUPP: 650.0000 mg | Freq: Four times a day (QID) | RECTAL | Status: DC | PRN

## 2017-07-12 MED ORDER — INSULIN GLARGINE 100 UNIT/ML ~~LOC~~ SOLN
14.0000 [IU] | Freq: Every day | SUBCUTANEOUS | Status: DC
  Administered 2017-07-12 – 2017-07-13 (×2): 14 [IU] via SUBCUTANEOUS
  Filled 2017-07-12 (×3): qty 0.14

## 2017-07-12 MED ORDER — ATORVASTATIN CALCIUM 20 MG PO TABS
20.0000 mg | ORAL_TABLET | Freq: Every day | ORAL | Status: DC
  Administered 2017-07-12 – 2017-07-13 (×2): 20 mg via ORAL
  Filled 2017-07-12 (×2): qty 1

## 2017-07-12 MED ORDER — AMLODIPINE BESYLATE 10 MG PO TABS
10.0000 mg | ORAL_TABLET | Freq: Every day | ORAL | Status: DC
Start: 2017-07-12 — End: 2017-07-14
  Administered 2017-07-12 – 2017-07-14 (×3): 10 mg via ORAL
  Filled 2017-07-12 (×3): qty 1

## 2017-07-12 MED ORDER — HYDRALAZINE HCL 50 MG PO TABS
50.0000 mg | ORAL_TABLET | Freq: Four times a day (QID) | ORAL | Status: DC
  Administered 2017-07-12 – 2017-07-14 (×9): 50 mg via ORAL
  Filled 2017-07-12 (×8): qty 1

## 2017-07-12 MED ORDER — PANTOPRAZOLE SODIUM 40 MG PO TBEC
40.0000 mg | DELAYED_RELEASE_TABLET | ORAL | Status: DC
  Administered 2017-07-12 – 2017-07-14 (×2): 40 mg via ORAL
  Filled 2017-07-12 (×2): qty 1

## 2017-07-12 MED ORDER — RANOLAZINE ER 500 MG PO TB12
500.0000 mg | ORAL_TABLET | Freq: Two times a day (BID) | ORAL | Status: DC
  Administered 2017-07-12 – 2017-07-14 (×5): 500 mg via ORAL
  Filled 2017-07-12 (×5): qty 1

## 2017-07-12 MED ORDER — ONDANSETRON HCL 4 MG PO TABS: 4.0000 mg | ORAL_TABLET | Freq: Four times a day (QID) | ORAL | Status: DC | PRN

## 2017-07-12 MED ORDER — SODIUM CHLORIDE 0.9 % IV SOLN
INTRAVENOUS | Status: DC
  Administered 2017-07-12 – 2017-07-13 (×3): via INTRAVENOUS

## 2017-07-12 MED ORDER — MAGNESIUM OXIDE 400 (241.3 MG) MG PO TABS
400.0000 mg | ORAL_TABLET | Freq: Every evening | ORAL | Status: DC
  Administered 2017-07-12 – 2017-07-13 (×2): 400 mg via ORAL
  Filled 2017-07-12 (×2): qty 1

## 2017-07-12 MED ORDER — LORAZEPAM 0.5 MG PO TABS: 0.5000 mg | ORAL_TABLET | ORAL | Status: DC | PRN

## 2017-07-12 MED ORDER — ENSURE ENLIVE PO LIQD
237.0000 mL | Freq: Two times a day (BID) | ORAL | Status: DC
  Administered 2017-07-13 – 2017-07-14 (×2): 237 mL via ORAL

## 2017-07-12 MED ORDER — DOCUSATE SODIUM 100 MG PO CAPS
100.0000 mg | ORAL_CAPSULE | Freq: Two times a day (BID) | ORAL | Status: DC
  Administered 2017-07-12 – 2017-07-14 (×5): 100 mg via ORAL
  Filled 2017-07-12 (×5): qty 1

## 2017-07-12 MED ORDER — INSULIN ASPART 100 UNIT/ML ~~LOC~~ SOLN
0.0000 [IU] | Freq: Three times a day (TID) | SUBCUTANEOUS | Status: DC
  Administered 2017-07-12: 2 [IU] via SUBCUTANEOUS
  Administered 2017-07-12: 7 [IU] via SUBCUTANEOUS
  Administered 2017-07-12: 2 [IU] via SUBCUTANEOUS
  Administered 2017-07-13: 1 [IU] via SUBCUTANEOUS
  Administered 2017-07-13 – 2017-07-14 (×3): 5 [IU] via SUBCUTANEOUS
  Administered 2017-07-14: 3 [IU] via SUBCUTANEOUS

## 2017-07-12 MED ORDER — TRAMADOL HCL 50 MG PO TABS
50.0000 mg | ORAL_TABLET | Freq: Two times a day (BID) | ORAL | Status: DC | PRN
  Administered 2017-07-12: 50 mg via ORAL
  Filled 2017-07-12: qty 1

## 2017-07-12 MED ORDER — SODIUM CHLORIDE 0.9 % IV SOLN: Freq: Once | INTRAVENOUS | Status: DC

## 2017-07-12 MED ORDER — ONDANSETRON HCL 4 MG/2ML IJ SOLN: 4.0000 mg | Freq: Four times a day (QID) | INTRAMUSCULAR | Status: DC | PRN

## 2017-07-12 NOTE — ED Notes (Signed)
[name], wife, went home to sleep. Will return to pick him up.  Home [phone] Cell [phone]

## 2017-07-12 NOTE — Consult Note (Signed)
[place]: Pt seen today as follow up from home visit last evening where foley was changed and drained but continued to clot off with  No success in irrigating manually. Pt having increased pain recommended pt to go to ER. We had considered taking pt to hospice home for continuous irrigation we ordered the continuous irrigation bags but they had not arrived as of yesterday. We will continue to follow. Family hoping that radiation with help some with continous bleeding. Pt seems very disappointed stating he had accepted that he was going to die from this but was hoping for a couple good weeks at lest at home. Offered encouragement and discussed that the team would be meeting to see what options we have and then they will be able to make the best decision from there. Wife at bedside. He also has children and sister (Lives in [place]) as a support system for wife and him. Thank you for your help. If there are questions or concerns please call. Jannette Fogo RN [phone]  Placed plan of care and last assessment on medical record for scanning. CK

## 2017-07-12 NOTE — Progress Notes (Signed)
  Radiation Oncology         [phone]) [phone] ________________________________  Name: AVIS MCMAHILL MRN: [id number]  Date: 07/12/2017  DOB: 15-Oct-1941  INPATIENT  SIMULATION AND TREATMENT PLANNING NOTE    ICD-10-CM   1. Malignant neoplasm of trigone of urinary bladder (HCC) C67.0     DIAGNOSIS:  76 yo man with hematuria from metastatic bladder cancer  NARRATIVE:  The patient was brought to the [facility].  Identity was confirmed.  All relevant records and images related to the planned course of therapy were reviewed.  The patient freely provided informed written consent to proceed with treatment after reviewing the details related to the planned course of therapy. The consent form was witnessed and verified by the simulation staff.  Then, the patient was set-up in a stable reproducible  supine position for radiation therapy.  CT images were obtained.  Surface markings were placed.  The CT images were loaded into the planning software.  Then the target and avoidance structures were contoured.  Treatment planning then occurred.  The radiation prescription was entered and confirmed.  Then, I designed and supervised the construction of a total of 5 medically necessary complex treatment devices with vacloc positioner and 4 MLCs to shield bowel, genitals and rectum.  I have requested : Isodose Plan.    PLAN:  The patient will receive 20 Gy in 4 fractions.  ________________________________  Sheral Apley Tammi Klippel, M.D.

## 2017-07-12 NOTE — Progress Notes (Signed)
Pharmacy Antibiotic Note  [name] is a 76 y.o. male admitted on 07/11/2017 with UTI.  Pharmacy has been consulted for Cefepime dosing.  Plan: Cefepime 1gm iv q24hr     Temp (24hrs), Avg:98.3 F (36.8 C), Min:97.8 F (36.6 C), Max:98.7 F (37.1 C)   Recent Labs Lab 07/06/17 0255 07/07/17 0335 07/08/17 0229 07/12/17 0017 07/12/17 0034  WBC 12.9* 11.7* 13.9*  --  12.7*  CREATININE 2.30* 2.26* 2.43* 2.50* 2.44*    Estimated Creatinine Clearance: 23.6 mL/min (A) (by C-G formula based on SCr of 2.44 mg/dL (H)).    Allergies  Allergen Reactions  . Other Itching and Other (See Comments)    The iv dye used in Fundus Flourescein Angiography procedure  Kidney issues  . Penicillins Hives    Augmentin Has patient had a PCN reaction causing immediate rash, facial/tongue/throat swelling, SOB or lightheadedness with hypotension: No Has patient had a PCN reaction causing severe rash involving mucus membranes or skin necrosis: No Has patient had a PCN reaction that required hospitalization: Yes Has patient had a PCN reaction occurring within the last 10 years: Yes If all of the above answers are "NO", then may proceed with Cephalosporin use.     Antimicrobials this admission: UTI  Dose adjustments this admission: -  Microbiology results: pending  Thank you for allowing pharmacy to be a part of this patient's care.  Nani Skillern Crowford 07/12/2017 6:20 AM

## 2017-07-12 NOTE — Progress Notes (Signed)
Pt seen and examined, admitted earlier this am by Dr.Smith, -Basically 75/M with stage 4 bladder CA, chronic obstructive uropathy with Foley catheter, AFib, CKD3-4, DM2, COPD, CABG just discharged from [facility] on 9/21 has continued to have ongoing issues with hematuria and clots causing obstruction of the catheter. -His catheter was changed by the hospice nurse 2 days ago, and upgraded and sized to a 22 [place], patient was back in the ER the following day, he then had a three-way catheter placed discharged home and came back to the ER again due to inability to irrigate the Foley catheter, ongoing clots and urinary retention. This morning started on CBI on admission. -Up on last oncology evaluation, by Dr.Shadad it was felt that radiation therapy may help decrease his bleeding and hence he was scheduled to see Dr. Ledon Snare on 10/4, I have requested a radiation oncology consult in the hospital -continue bladder irrigation at this time  -Urology consult requested per [facility] -no s/s of infection, stop Abx at this time  Domenic Polite, MD

## 2017-07-12 NOTE — Care Management Note (Signed)
Case Management Note  Patient Details  Name: [name] MRN: [id number] Date of Birth: [birth year]  Subjective/Objective:  76 y/o m admitted w/Obstructive uropathy. From home. Confirmed active w/[facility] onsite rep aware,she called admitting for hospice related admission-primary insurance [facility].                  Action/Plan:d/c plan resume home w/hospice.   Expected Discharge Date:                  Expected Discharge Plan:  Home w Hospice Care  In-House Referral:     Discharge planning Services  CM Consult  Post Acute Care Choice:    Choice offered to:     DME Arranged:    DME Agency:     HH Arranged:    [place] Agency:     Status of Service:  In process, will continue to follow  If discussed at Long Length of Stay Meetings, dates discussed:    Additional Comments:  Dessa Phi, RN 07/12/2017, 10:42 AM

## 2017-07-12 NOTE — ED Notes (Signed)
Unable to collect labs patient is receiving blood product.

## 2017-07-12 NOTE — ED Notes (Signed)
I/o drained foley bag ( 3 urinals )3 liters out

## 2017-07-12 NOTE — Consult Note (Signed)
Radiation Oncology         ([phone]) [phone] ________________________________  Initial inpatient Consultation  Name: [name] MRN: [id number]  Date of Service: 07/11/2017 DOB: November 01, 1940  LO:[id number], David Saxon, MD  No ref. provider found   REFERRING PHYSICIAN: No ref. provider found  DIAGNOSIS: The primary encounter diagnosis was Complication of Foley catheter, subsequent encounter. Diagnoses of Urinary retention, Low hemoglobin, and ARF (acute renal failure) (HCC) were also pertinent to this visit.    ICD-10-CM   1. Complication of Foley catheter, subsequent encounter T83.9XXD   2. Urinary retention R33.9   3. Low hemoglobin D64.9   4. ARF (acute renal failure) (HCC) N17.9 CANCELED: US RENAL    CANCELED: US RENAL    HISTORY OF PRESENT ILLNESS: [name] is a 76 y.o. male seen at the request of Dr. Broadus John.  He has a history of stage IV squamous cell bladder cancer recently complicated by recurrent gross hematuria resulting in clot retention despite chronic indwelling foley catheter.  He has been admitted on three separate occasions over the past week for further management of hematuria and has had the foley catheter exchanged for a larger size on each admission.  Most recently, he was admitted earlier this morning and foley catheter was changed from 12fr to 73fr three way catheter and patient placed on continuous bladder irrigation.  He reports improved suprapubic discomfort since admission and foley catheter is currently draining appropriately.  He has previously performed self CIC for the past 5 years due to chronic urinary obstruction and has been followed by Dr. Karsten Ro at [facility].  More recently, he was transferred to the care of Dr. Tresa Moore for consideration of cystectomy but has since been determined to not be a good surgical candidate nor a candidate for systemic therapy and has recently transitioned to hospice care. He has stage III CKD with a baseline creatinine between  1.7-2.0.  Creatinine was elevated at 2.44 on admission.    We have been consulted for consideration of palliative radiotherapy to help manage the persistent gross hematuria.  PREVIOUS RADIATION THERAPY: No  PAST MEDICAL HISTORY:  Past Medical History:  Diagnosis Date  . Anemia    a. + FOBT 08/2014.  Marland Kitchen Anginal pain ([place])    comes and goes; uses nitro to relieve   . Arthritis   . Atherosclerosis of native arteries of the extremities with intermittent claudication    a. L external iliac stent 2001, bilateral SFA occlusions documented 2001.  . Atrial fibrillation ([place])   . Bladder cancer ([place])   . CAD (coronary artery disease), native coronary artery 09/24/2013   a. CABG x 6 1999. b. Interim PCIs: Angioplasty SVG-PDA 2006.Taxus DES to native LCx in 2007.DES to SVG-PDA 2007, restented 2009.NSTEMI with PCI to SVG- PDA complicated by no-reflow/inferior infarction in 2012. c. Redo CABG 2015 with SVG-PD, SVG-D1, SVG-OM1.  . Carotid artery occlusion    a. Duplex 12/2013: mild plaque bilat, 40-59% BICA. F/u due 12/2014.  Marland Kitchen Chronic combined systolic and diastolic CHF (congestive heart failure) ([place])    a. Prior EF 45-50% in 08/2014. b. Improved EF >55% in 09/2014 at [facility].  . Chronic lower back pain   . CKD (chronic kidney disease) stage 3, GFR 30-59 ml/min   . COPD (chronic obstructive pulmonary disease) ([place])    pt states he doesn't have  . Daily headache    comes and goes pt denies daily currently   . Dysrhythmia   . Esophageal stricture  a. History of dilitation - Long-standing history of esophageal reflux   . Gait instability   . GERD (gastroesophageal reflux disease)   . Gout   . History of bronchitis   . History of gout   . History of hiatal hernia   . Hx of CABG 09/24/2013   a. 1999: LIMA to LAD, sequential SVG to diagonal 2 and diagonal 3, sequential SVG to PDA, acute marginal branch, and PL branch. b. Repeat CABG 2015 with SVG to D1, SVG to PDA, and SVG to OM.   Marland Kitchen  Hyperlipidemia   . Hypertension   . Hypotonic bladder    Requiring in and out catheterization performed by the patient at home - occasional blood tinged urine (self-cath's daily for the last 2 years) and hematuria is chronic for him, followed by urology.  . MI (myocardial infarction) ([place]) 1999-02/2014   total of 7 MI per patient  . OSA (obstructive sleep apnea)    "don't wear mask"   . Polymyalgia rheumatica ([place])   . Self-catheterizes urinary bladder   . Shortness of breath dyspnea   . Skin cancer   . SVT (supraventricular tachycardia) ([place])   . Type II diabetes mellitus ([place])       PAST SURGICAL HISTORY: Past Surgical History:  Procedure Laterality Date  . ABDOMINAL AORTAGRAM N/A 11/27/2014   Procedure: ABDOMINAL Maxcine Ham;  Surgeon: Wellington Hampshire, MD;  Location: [facility];  Service: Cardiovascular;  Laterality: N/A;  . ANKLE FRACTURE SURGERY Right ~ 1977   "10 plates and 7 screws placed in it"  . ANTERIOR CERVICAL DECOMP/DISCECTOMY FUSION    . BACK SURGERY     neck surgery x 2  . CARDIAC CATHETERIZATION    . CATARACT EXTRACTION W/ INTRAOCULAR LENS  IMPLANT, BILATERAL Bilateral 2000's  . COLONOSCOPY    . CORONARY ARTERY BYPASS GRAFT  1999  . CORONARY ARTERY BYPASS GRAFT N/A 03/01/2014   Procedure: REDO CORONARY ARTERY BYPASS GRAFTING TIMES THREE, USING RIGHT GREATER SAPHENOUS VEIN VIA ENDOVEIN HARVEST.;  Surgeon: Gaye Pollack, MD;  Location: [facility] OR;  Service: Open Heart Surgery;  Laterality: N/A;  . CORONARY STENT PLACEMENT     "I've had 8 put in; I presently have none" (08/26/2014)  . CYSTOSCOPY W/ RETROGRADES Bilateral 03/03/2015   Procedure: CYSTOSCOPY WITH LEFT RETROGRADE PYELOGRAM;  Surgeon: Kathie Rhodes, MD;  Location: [facility];  Service: Urology;  Laterality: Bilateral;  . CYSTOSCOPY WITH BIOPSY N/A 04/25/2017   Procedure: CYSTOSCOPY WITH BLADDER BIOPSY;  Surgeon: Kathie Rhodes, MD;  Location: [facility];  Service: Urology;  Laterality: N/A;  . ENDARTERECTOMY Left 01/30/2015     Procedure: ENDARTERECTOMY CAROTID WITH PRIMARY CLOSURE OF ARTERY;  Surgeon: Angelia Mould, MD;  Location: [facility];  Service: Vascular;  Laterality: Left;  . ESOPHAGOGASTRODUODENOSCOPY (EGD) WITH ESOPHAGEAL DILATION  X 1  . EYE SURGERY     cataract surgery bilat   . FRACTURE SURGERY Right    ankle  . INTRAOPERATIVE TRANSESOPHAGEAL ECHOCARDIOGRAM N/A 03/01/2014   Procedure: INTRAOPERATIVE TRANSESOPHAGEAL ECHOCARDIOGRAM;  Surgeon: Gaye Pollack, MD;  Location: [facility] OR;  Service: Open Heart Surgery;  Laterality: N/A;  . IR NEPHROSTOMY PLACEMENT RIGHT  05/11/2017  . IR URETERAL STENT PLACEMENT EXISTING ACCESS RIGHT  05/25/2017  . LEFT HEART CATHETERIZATION WITH CORONARY/GRAFT ANGIOGRAM  02/26/2014   Procedure: LEFT HEART CATHETERIZATION WITH Beatrix Fetters;  Surgeon: Sinclair Grooms, MD;  Location: [facility] CATH LAB;  Service: Cardiovascular;;  . LEFT HEART CATHETERIZATION WITH CORONARY/GRAFT ANGIOGRAM N/A 07/23/2014  Procedure: LEFT HEART CATHETERIZATION WITH Beatrix Fetters;  Surgeon: Sinclair Grooms, MD;  Location: [facility] C[facility] LAB;  Service: Cardiovascular;  Laterality: N/A;  . LEFT HEART CATHETERIZATION WITH CORONARY/GRAFT ANGIOGRAM N/A 08/30/2014   Procedure: LEFT HEART CATHETERIZATION WITH Beatrix Fetters;  Surgeon: Leonie Man, MD;  Location: [facility] C[facility];  Service: Cardiovascular;  Laterality: N/A;  . MOHS SURGERY  X 2   "nose; nose"  . MULTIPLE TOOTH EXTRACTIONS    . SKIN CANCER EXCISION     "all over my head; ears; back  . TONSILLECTOMY    . TRANSURETHRAL RESECTION OF BLADDER TUMOR WITH GYRUS (TURBT-GYRUS) N/A 03/03/2015   Procedure: TRANSURETHRAL RESECTION OF BLADDER TUMOR;  Surgeon: Kathie Rhodes, MD;  Location: [facility];  Service: Urology;  Laterality: N/A;  . TRANSURETHRAL RESECTION OF BLADDER TUMOR WITH GYRUS (TURBT-GYRUS) N/A 04/28/2015   Procedure: TRANSURETHRAL RESECTION OF BLADDER TUMOR ;  Surgeon: Kathie Rhodes, MD;  Location: [facility];  Service: Urology;   Laterality: N/A;  . TRANSURETHRAL RESECTION OF BLADDER TUMOR WITH GYRUS (TURBT-GYRUS) N/A 11/24/2015   Procedure: TRANSURETHRAL RESECTION OF BLADDER TUMOR WITH GYRUS (TURBT-GYRUS);  Surgeon: Kathie Rhodes, MD;  Location[facility];  Service: Urology;  Laterality: N/A;  . TRANSURETHRAL RESECTION OF PROSTATE N/A 04/25/2017   Procedure: TRANSURETHRAL RESECTION OF THE PROSTATE (TURP);  Surgeon: Kathie Rhodes, MD;  Location: [facility];  Service: Urology;  Laterality: N/A;    FAMILY HISTORY:  Family History  Problem Relation Age of Onset  . CAD Mother   . Heart disease Mother   . Hypertension Mother   . Cirrhosis Brother   . Heart disease Father   . Hyperlipidemia Father   . Hypertension Father   . Heart attack Father     SOCIAL HISTORY:  Social History   Social History  . Marital status: Married    Spouse name: N/A  . Number of children: N/A  . Years of education: N/A   Occupational History  . Not on file.   Social History Main Topics  . Smoking status: Former Smoker    Packs/day: 2.00    Years: 50.00    Types: Cigarettes    Quit date: 09/18/2015  . Smokeless tobacco: Never Used  . 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 Drug use: No  . Sexual activity: Not on file   Other Topics Concern  . Not on file   Social History Narrative   Tour manager           ALLERGIES: Other and Penicillins  MEDICATIONS:  Current Facility-Administered Medications  Medication Dose Route Frequency Provider Last Rate Last Dose  . 0.9 %  sodium chloride infusion   Intravenous Once Virgina Jock, [place] N, PA-C      . 0.9 %  sodium chloride infusion   Intravenous Continuous Domenic Polite, MD 50 mL/hr at 07/12/17 1235    . acetaminophen (TYLENOL) tablet 650 mg  650 mg Oral Q6H PRN Norval Morton, MD       Or  . acetaminophen (TYLENOL) suppository 650 mg  650 mg Rectal Q6H PRN Fuller Plan A, MD      . amLODipine (NORVASC) tablet 10 mg  10  mg Oral Daily Fuller Plan A, MD   10 mg at 07/12/17 0903  . atorvastatin (LIPITOR) tablet 20 mg  20 mg Oral q1800 Fuller Plan A, MD   20 mg at 07/12/17 1818  .  docusate sodium (COLACE) capsule 100 mg  100 mg Oral BID Fuller Plan A, MD   100 mg at 07/12/17 0902  . feeding supplement (BOOST / RESOURCE BREEZE) liquid 1 Container  1 Container Oral TID BM Domenic Polite, MD      . feeding supplement (ENSURE ENLIVE) (ENSURE ENLIVE) liquid 237 mL  237 mL Oral BID BM Domenic Polite, MD      . hydrALAZINE (APRESOLINE) tablet 50 mg  50 mg Oral Q6H Smith, Rondell A, MD   50 mg at 07/12/17 1531  . insulin aspart (novoLOG) injection 0-9 Units  0-9 Units Subcutaneous TID WC Norval Morton, MD   2 Units at 07/12/17 1818  . insulin glargine (LANTUS) injection 14 Units  14 Units Subcutaneous Q2200 Smith, Rondell A, MD      . isosorbide mononitrate (IMDUR) 24 hr tablet 120 mg  120 mg Oral Daily Tamala Julian, Rondell A, MD   120 mg at 07/12/17 0902  . LORazepam (ATIVAN) tablet 0.5 mg  0.5 mg Oral Q4H PRN Smith, Rondell A, MD      . magnesium oxide (MAG-OX) tablet 400 mg  400 mg Oral QPM Smith, Rondell A, MD   400 mg at 07/12/17 1818  . ondansetron (ZOFRAN) tablet 4 mg  4 mg Oral Q6H PRN Fuller Plan A, MD       Or  . ondansetron (ZOFRAN) injection 4 mg  4 mg Intravenous Q6H PRN Smith, Rondell A, MD      . pantoprazole (PROTONIX) EC tablet 40 mg  40 mg Oral QODAY Smith, Rondell A, MD   40 mg at 07/12/17 0903  . ranolazine (RANEXA) 12 hr tablet 500 mg  500 mg Oral BID Fuller Plan A, MD   500 mg at 07/12/17 0903  . traMADol (ULTRAM) tablet 50 mg  50 mg Oral Q12H PRN Fuller Plan A, MD   50 mg at 07/12/17 5462    REVIEW OF SYSTEMS:  On review of systems, the patient reports that he is doing well overall with less suprapubic discomfort since admission and exchange of foley catheter, now on continuous bladder irrigation. Foley catheter draining appropriately but conitnus with gross hematuria.  He denies  dysuria or flank pain.  He denies any chest pain, shortness of breath, cough, fevers, chills, night sweats, unintended weight changes. He denies any bowel or bladder disturbances, aside from increased gas, and denies abdominal pain, nausea or vomiting. He denies any new musculoskeletal or joint aches or pains. A complete review of systems is obtained and is otherwise negative.    PHYSICAL EXAM:  Wt Readings from Last 3 Encounters:  07/12/17 156 lb 4.9 oz (70.9 kg)  07/11/17 155 lb (70.3 kg)  07/08/17 154 lb 11.2 oz (70.2 kg)   Temp Readings from Last 3 Encounters:  07/12/17 98 F (36.7 C) (Oral)  07/11/17 98 F (36.7 C) (Oral)  07/08/17 (!) 97.4 F (36.3 C) (Oral)   BP Readings from Last 3 Encounters:  07/12/17 (!) 154/47  07/11/17 (!) 160/58  07/08/17 (!) 162/54   Pulse Readings from Last 3 Encounters:  07/12/17 78  07/11/17 70  07/08/17 71   Pain Assessment Pain Score: 0-No pain/10  In general this is a well appearing caucasian male in no acute distress. He is alert and oriented x4 and appropriate throughout the examination. HEENT reveals that the patient is normocephalic, atraumatic. EOMs are intact. PERRLA. Skin is intact without any evidence of gross lesions. Cardiovascular exam reveals a regular rate and rhythm,  no clicks rubs or murmurs are auscultated. Chest is clear to auscultation bilaterally. Lymphatic assessment is performed and does not reveal any adenopathy in the cervical, supraclavicular, axillary, or inguinal chains. Abdomen has active bowel sounds in all quadrants and is intact. The abdomen is soft, non tender, non distended. Lower extremities are negative for pretibial pitting edema, deep calf tenderness, cyanosis or clubbing.  Foley catheter in place and draining appropriately without visible clots at present.   KPS = 50  100 - Normal; no complaints; no evidence of disease. 90   - Able to carry on normal activity; minor signs or symptoms of disease. 80   -  Normal activity with effort; some signs or symptoms of disease. 76   - Cares for self; unable to carry on normal activity or to do active work. 60   - Requires occasional assistance, but is able to care for most of his personal needs. 50   - Requires considerable assistance and frequent medical care. 7   - Disabled; requires special care and assistance. 26   - Severely disabled; hospital admission is indicated although death not imminent. 74   - Very sick; hospital admission necessary; active supportive treatment necessary. 10   - Moribund; fatal processes progressing rapidly. 0     - Dead  Karnofsky DA, Abelmann [place], Craver LS and Burchenal [facility] [phone]) The use of the nitrogen mustards in the palliative treatment of carcinoma: with particular reference to bronchogenic carcinoma Cancer 1 634-56  LABORATORY DATA:  Lab Results  Component Value Date   WBC 12.0 (H) 07/12/2017   HGB 9.3 (L) 07/12/2017   HCT 27.3 (L) 07/12/2017   MCV 80.8 07/12/2017   PLT 268 07/12/2017   Lab Results  Component Value Date   NA 134 (L) 07/12/2017   K 4.3 07/12/2017   CL 101 07/12/2017   CO2 25 07/12/2017   Lab Results  Component Value Date   ALT 25 07/04/2017   AST 17 07/04/2017   ALKPHOS 74 07/04/2017   BILITOT 0.3 07/04/2017     RADIOGRAPHY: US Renal  Result Date: 07/02/2017 CLINICAL DATA:  Gross hematuria. EXAM: RENAL / URINARY TRACT ULTRASOUND COMPLETE COMPARISON:  PET-CT dated June 07, 2017. Renal ultrasound dated December 25, 2014. FINDINGS: Right Kidney: Length: 10.2 cm. Echogenicity within normal limits. No mass or hydronephrosis visualized. Left Kidney: Length: 11.2 cm. Echogenicity within normal limits. Mild hydronephrosis. No mass visualized. Bladder: Decompressed by Foley catheter. IMPRESSION: Mild left hydronephrosis, similar to recent PET-CT, allowing for differences in technique. Electronically Signed   By: Titus Dubin M.D.   On: 07/02/2017 10:42   Dg Chest Port 1 View  Result Date:  07/03/2017 CLINICAL DATA:  Shortness of breath EXAM: PORTABLE CHEST 1 VIEW COMPARISON:  07/01/2017 and previous FINDINGS: Previous median sternotomy and CABG. The heart is enlarged. There is interstitial pulmonary edema. No infiltrate, consolidation or collapse. No effusion. IMPRESSION: New development of interstitial pulmonary edema. Electronically Signed   By: Nelson Chimes M.D.   On: 07/03/2017 13:48   Dg Chest Port 1 View  Result Date: 07/01/2017 CLINICAL DATA:  History of bladder cancer. EXAM: PORTABLE CHEST 1 VIEW COMPARISON:  PET-CT 06/07/2017. FINDINGS: Prior CABG. Cardiomegaly with pulmonary vascular prominence. Mild interstitial prominence. Mild component congestive heart failure cannot be excluded. No pleural effusion or pneumothorax. IMPRESSION: Prior CABG. A mild component of congestive heart failure cannot be excluded. Electronically Signed   By: Marcello Moores  Register   On: 07/01/2017 15:02  IMPRESSION/PLAN: 1. 76 y.o. male with persistent gross hematuria secondary to stage IV bladder cancer. Today, we talked to the patient and his wife about the findings and workup thus far. We discussed the natural history of hematuria from stage IV bladder carcinoma and general treatment, highlighting the role of palliative radiotherapy in the management of persistent gross hematuria, with an 80% chance of controlling the bleeding with this treatment. We discussed the available radiation techniques, and focused on the details of logistics and delivery. We reviewed the anticipated acute and late sequelae associated with radiation in this setting. The patient was encouraged to ask questions that were answered to his satisfaction. At the end of our conversation, the patient elects to proceed with palliative radiotherapy to the bladder to help control the gross hematuria.  He freely signed written consent which was placed in his medical chart and a copy provided to the patient for his records.  He will undergo  CT Simulation at 1pm this afternoon and plan to begin treatment later this evening.    Nicholos Johns, PA-C    Tyler Pita, MD  [facility] Direct Dial: [phone]  Fax: [phone] [email or web address]  Skype  [email or web address]    Page Me

## 2017-07-12 NOTE — Care Management Obs Status (Signed)
[place] NOTIFICATION   Patient Details  Name: [name] MRN: [id number] Date of Birth: 10-11-1941   Medicare Observation Status Notification Given:  Yes    Dessa Phi, RN 07/12/2017, 3:57 PM

## 2017-07-12 NOTE — ED Provider Notes (Signed)
Patient signed out by PA Virgina Jock at end of shift. The patient is a bladder CA patient, on Hospice, with indwelling foley catheter that has occluded multiple times over the last 30 hours due to blood clots. His wife attempts to irrigate at home without clearance. He was seen 07/10/17 in the ED and his 18Fr foley was changed to a 22Fr 3-way with adequate urinary drainage. He returned last night with retention and a new 22Fr foley was placed. Labs were done to evaluate stability (VSS, hemodynamically stable patient). Initially hgb on I-stat was 6.5. A CBC was done and hgb 8.3. Chart reviewed. Hgb 9.2 4 days ago. He reports that an appointment with radiation oncology is scheduled for the morning (07/12/17) for palliative care in hopes of stopping the bleeding in the bladder. The patient, per past note, is not a candidate for chemo or surgery, hence he accepted being placed on Hospice care.  While waiting for labs the patient's foley clogged again causing increasing lower abdominal pain that did not resolve with irrigation. It was again replaced and has been draining adequately. He received 1 unit of packed RBCs in the ED.   Discussed recurrent foley occlusion with urology, Dr. Lovena Neighbours who advised insertion of larger catheter and increased balloon fill of 30 cc. At this point it is felt the patient should be admitted for observation of foley catheter drainage/blockage, repeat hemoglobin and coordination of care with radiation oncology to pursue control of bleeding.  The patient is amenable to admission. His wife will be contacted at home and made aware of plan to admit.   Charlann Lange, PA-C 07/12/17 0962    Duffy Bruce, MD 07/12/17 1018

## 2017-07-12 NOTE — H&P (Addendum)
History and Physical    CHRISTOHPER DUBE EPP:[id number] DOB: 05/09/1941 DOA: 07/11/2017  Referring MD/NP/PA: Charlann Lange, PA-C PCP: Shirline Frees, MD  Patient coming from: home   Chief Complaint: Unable to irrigate Foley catheter  HPI: [name] is a 76 y.o. male with medical history significant of squamous cell cancer of the bladder, A. fib, CKD stage III, DMtype 2, COPD, CAD s/p CABG, and obstructive uropathy with chronic indwelling Foley; who presents with complaints of inability to irrigate Foley since yesterday. The patient was seen in the emergency department the day before with the same complaints, and at that time his Foley catheter size was increase to 22 [place] from a 18. He notes his wife was performing irrigation and supervised twice daily, but he subsequently became obstructed as soon as he left the emergency department. Hospice care reportedly had relieved an obstruction prior to him coming to the hospital last night. He reports that he has not been on his aspirin or Plavix due to bleeding. Reports having some lower abdominal discomfort and due to symptoms. Denies having any fever, chills, nausea,or vomiting.  He has an appointment with Dr. Alen Blew scheduled for later this morning as it is thought that radiation therapy may help decrease bleeding. Records shows that the patient had recently been hospitalized as well from 9/14 through 9/24 obstructive uropathy due to gross hematuria for which he was treated for possible UTI with ceftriaxone. Cultures grew out multiple species suggesting need of recollection.   ED Course: Upon admission to the emergency department patient was noted to have vital signs relatively within normal limits. Hemoglobin noted to be 8.3, BUN 52, and creatinine 2.44. Urinalysis was positive for moderate leukocytes, positive nitrates, TNTC rbc's, and 0-5 wbcs. Patient required replacement of 1 French Foley catheter at least 2 times while in the ED due to it  becoming obstructed, but were able to relieve obstruction and he was placed on continuous bladder irrigation. Due to the 1 g drop in hemoglobin patient was ordered to be transfused 1 unit of blood. Due to his repeated obstruction issues with Foley catheter [facility] called to admit.   Review of Systems  Constitutional: Negative for chills and fever.  HENT: Negative for ear discharge and ear pain.   Eyes: Negative for double vision and photophobia.  Respiratory: Negative for cough, sputum production and shortness of breath.   Cardiovascular: Negative for palpitations and orthopnea.  Gastrointestinal: Positive for abdominal pain. Negative for constipation, nausea and vomiting.  Genitourinary: Positive for hematuria and urgency.  Musculoskeletal: Positive for back pain (Chronic). Negative for falls.  Skin: Negative for itching and rash.  Neurological: Negative for speech change, focal weakness and loss of consciousness.  Psychiatric/Behavioral: Negative for hallucinations. The patient is nervous/anxious.     Past Medical History:  Diagnosis Date  . Anemia    a. + FOBT 08/2014.  Marland Kitchen Anginal pain ([place])    comes and goes; uses nitro to relieve   . Arthritis   . Atherosclerosis of native arteries of the extremities with intermittent claudication    a. L external iliac stent 2001, bilateral SFA occlusions documented 2001.  . Atrial fibrillation ([place])   . Bladder cancer ([place])   . CAD (coronary artery disease), native coronary artery 09/24/2013   a. CABG x 6 1999. b. Interim PCIs: Angioplasty SVG-PDA 2006.Taxus DES to native LCx in 2007.DES to SVG-PDA 2007, restented 2009.NSTEMI with PCI to SVG- PDA complicated by no-reflow/inferior infarction in 2012. c. Redo CABG 2015 with  SVG-PD, SVG-D1, SVG-OM1.  . Carotid artery occlusion    a. Duplex 12/2013: mild plaque bilat, 40-59% BICA. F/u due 12/2014.  Marland Kitchen Chronic combined systolic and diastolic CHF (congestive heart failure) ([place])    a. Prior EF 45-50% in  08/2014. b. Improved EF >55% in 09/2014 at [facility].  . Chronic lower back pain   . CKD (chronic kidney disease) stage 3, GFR 30-59 ml/min   . COPD (chronic obstructive pulmonary disease) ([place])    pt states he doesn't have  . Daily headache    comes and goes pt denies daily currently   . Dysrhythmia   . Esophageal stricture    a. History of dilitation - Long-standing history of esophageal reflux   . Gait instability   . GERD (gastroesophageal reflux disease)   . Gout   . History of bronchitis   . History of gout   . History of hiatal hernia   . Hx of CABG 09/24/2013   a. 1999: LIMA to LAD, sequential SVG to diagonal 2 and diagonal 3, sequential SVG to PDA, acute marginal branch, and PL branch. b. Repeat CABG 2015 with SVG to D1, SVG to PDA, and SVG to OM.   Marland Kitchen Hyperlipidemia   . Hypertension   . Hypotonic bladder    Requiring in and out catheterization performed by the patient at home - occasional blood tinged urine (self-cath's daily for the last 2 years) and hematuria is chronic for him, followed by urology.  . MI (myocardial infarction) ([place]) 1999-02/2014   total of 7 MI per patient  . OSA (obstructive sleep apnea)    "don't wear mask"   . Polymyalgia rheumatica ([place])   . Self-catheterizes urinary bladder   . Shortness of breath dyspnea   . Skin cancer   . SVT (supraventricular tachycardia) ([place])   . Type II diabetes mellitus ([place])     Past Surgical History:  Procedure Laterality Date  . ABDOMINAL AORTAGRAM N/A 11/27/2014   Procedure: ABDOMINAL Maxcine Ham;  Surgeon: Wellington Hampshire, MD;  Location: [place] CATH LAB;  Service: Cardiovascular;  Laterality: N/A;  . ANKLE FRACTURE SURGERY Right ~ 1977   "10 plates and 7 screws placed in it"  . ANTERIOR CERVICAL DECOMP/DISCECTOMY FUSION    . BACK SURGERY     neck surgery x 2  . CARDIAC CATHETERIZATION    . CATARACT EXTRACTION W/ INTRAOCULAR LENS  IMPLANT, BILATERAL Bilateral 2000's  . COLONOSCOPY    . CORONARY ARTERY BYPASS GRAFT  1999    . CORONARY ARTERY BYPASS GRAFT N/A 03/01/2014   Procedure: REDO CORONARY ARTERY BYPASS GRAFTING TIMES THREE, USING RIGHT GREATER SAPHENOUS VEIN VIA ENDOVEIN HARVEST.;  Surgeon: Gaye Pollack, MD;  Location: [facility] OR;  Service: Open Heart Surgery;  Laterality: N/A;  . CORONARY STENT PLACEMENT     "I've had 8 put in; I presently have none" (08/26/2014)  . CYSTOSCOPY W/ RETROGRADES Bilateral 03/03/2015   Procedure: CYSTOSCOPY WITH LEFT RETROGRADE PYELOGRAM;  Surgeon: Kathie Rhodes, MD;  Location: [facility] ORS;  Service: Urology;  Laterality: Bilateral;  . CYSTOSCOPY WITH BIOPSY N/A 04/25/2017   Procedure: CYSTOSCOPY WITH BLADDER BIOPSY;  Surgeon: Kathie Rhodes, MD;  Location: [facility];  Service: Urology;  Laterality: N/A;  . ENDARTERECTOMY Left 01/30/2015   Procedure: ENDARTERECTOMY CAROTID WITH PRIMARY CLOSURE OF ARTERY;  Surgeon: Angelia Mould, MD;  Location: [place];  Service: Vascular;  Laterality: Left;  . ESOPHAGOGASTRODUODENOSCOPY (EGD) WITH ESOPHAGEAL DILATION  X 1  . EYE SURGERY  cataract surgery bilat   . FRACTURE SURGERY Right    ankle  . INTRAOPERATIVE TRANSESOPHAGEAL ECHOCARDIOGRAM N/A 03/01/2014   Procedure: INTRAOPERATIVE TRANSESOPHAGEAL ECHOCARDIOGRAM;  Surgeon: Gaye Pollack, MD;  Location[facility];  Service: Open Heart Surgery;  Laterality: N/A;  . IR NEPHROSTOMY PLACEMENT RIGHT  05/11/2017  . IR URETERAL STENT PLACEMENT EXISTING ACCESS RIGHT  05/25/2017  . LEFT HEART CATHETERIZATION WITH CORONARY/GRAFT ANGIOGRAM  02/26/2014   Procedure: LEFT HEART CATHETERIZATION WITH Beatrix Fetters;  Surgeon: Sinclair Grooms, MD;  Location[facility] CATH LAB;  Service: Cardiovascular;;  . LEFT HEART CATHETERIZATION WITH CORONARY/GRAFT ANGIOGRAM N/A 07/23/2014   Procedure: LEFT HEART CATHETERIZATION WITH Beatrix Fetters;  Surgeon: Sinclair Grooms, MD;  Location: [facility] CATH LAB;  Service: Cardiovascular;  Laterality: N/A;  . LEFT HEART CATHETERIZATION WITH CORONARY/GRAFT ANGIOGRAM N/A  08/30/2014   Procedure: LEFT HEART CATHETERIZATION WITH Beatrix Fetters;  Surgeon: Leonie Man, MD;  Location: [facility] CATH LAB;  Service: Cardiovascular;  Laterality: N/A;  . MOHS SURGERY  X 2   "nose; nose"  . MULTIPLE TOOTH EXTRACTIONS    . SKIN CANCER EXCISION     "all over my head; ears; back  . TONSILLECTOMY    . TRANSURETHRAL RESECTION OF BLADDER TUMOR WITH GYRUS (TURBT-GYRUS) N/A 03/03/2015   Procedure: TRANSURETHRAL RESECTION OF BLADDER TUMOR;  Surgeon: Kathie Rhodes, MD;  [facility];  Service: Urology;  Laterality: N/A;  . TRANSURETHRAL RESECTION OF BLADDER TUMOR WITH GYRUS (TURBT-GYRUS) N/A 04/28/2015   Procedure: TRANSURETHRAL RESECTION OF BLADDER TUMOR ;  Surgeon: Kathie Rhodes, MD;  Location: [facility];  Service: Urology;  Laterality: N/A;  . TRANSURETHRAL RESECTION OF BLADDER TUMOR WITH GYRUS (TURBT-GYRUS) N/A 11/24/2015   Procedure: TRANSURETHRAL RESECTION OF BLADDER TUMOR WITH GYRUS (TURBT-GYRUS);  Surgeon: Kathie Rhodes, MD;  [facility];  Service: Urology;  Laterality: N/A;  . TRANSURETHRAL RESECTION OF PROSTATE N/A 04/25/2017   Procedure: TRANSURETHRAL RESECTION OF THE PROSTATE (TURP);  Surgeon: Kathie Rhodes, MD;  [facility];  Service: Urology;  Laterality: N/A;     reports that he quit smoking about 21 months ago. His smoking use included Cigarettes. He has a 100.00 pack-year smoking history. He has never used smokeless tobacco. He reports that he does not drink alcohol or use drugs.  Allergies  Allergen Reactions  . Other Itching and Other (See Comments)    The iv dye used in Fundus Flourescein Angiography procedure  Kidney issues  . Penicillins Hives    Augmentin Has patient had a PCN reaction causing immediate rash, facial/tongue/throat swelling, SOB or lightheadedness with hypotension: No Has patient had a PCN reaction causing severe rash involving mucus membranes or skin necrosis: No Has patient had a PCN reaction that required hospitalization:  Yes Has patient had a PCN reaction occurring within the last 10 years: Yes If all of the above answers are "NO", then may proceed with Cephalosporin use.     Family History  Problem Relation Age of Onset  . CAD Mother   . Heart disease Mother   . Hypertension Mother   . Cirrhosis Brother   . Heart disease Father   . Hyperlipidemia Father   . Hypertension Father   . Heart attack Father     Prior to Admission medications   Medication Sig Start Date End Date Taking? Authorizing Provider  amLODipine (NORVASC) 10 MG tablet Take 10 mg by mouth every morning.    Yes [provider]  atorvastatin (LIPITOR) 20 MG tablet Take 20  mg by mouth daily at 6 PM.    Yes [provider]  cholecalciferol (VITAMIN D) 400 units TABS tablet Take 400 Units by mouth at bedtime.    Yes [provider]  docusate sodium (COLACE) 100 MG capsule Take 1 capsule (100 mg total) by mouth 2 (two) times daily. 07/08/17  Yes Allie Bossier, MD  Ferrous Gluconate (IRON 27 PO) Take 1 tablet by mouth every evening.   Yes [provider]  haloperidol (HALDOL) 1 MG tablet Take 1 mg by mouth every 8 (eight) hours as needed for agitation.   Yes [provider]  hydrALAZINE (APRESOLINE) 50 MG tablet Take 1 tablet (50 mg total) by mouth every 6 (six) hours. 07/08/17  Yes Allie Bossier, MD  isosorbide mononitrate (IMDUR) 120 MG 24 hr tablet Take 1 tablet (120 mg total) by mouth daily. 01/31/17  Yes Belva Crome, MD  LANTUS SOLOSTAR 100 UNIT/ML Solostar Pen Inject 14 Units into the skin daily. 07/08/17  Yes Allie Bossier, MD  LORazepam (ATIVAN) 0.5 MG tablet Take 0.5 mg by mouth every 4 (four) hours as needed for anxiety or sleep.   Yes [provider]  magnesium oxide (MAG-OX) 400 (241.3 Mg) MG tablet Take 400 mg by mouth every evening.    Yes [provider]  metoprolol tartrate (LOPRESSOR) 100 MG tablet Take 0.5 tablets (50 mg total) by mouth 2 (two) times daily.  03/22/17  Yes Wellington Hampshire, MD  Misc Natural Products (TART CHERRY ADVANCED) CAPS Take 1 capsule by mouth daily.   Yes [provider]  pantoprazole (PROTONIX) 40 MG tablet Take 1 tablet (40 mg total) by mouth daily. Patient taking differently: Take 40 mg by mouth every other day.  03/07/17  Yes Belva Crome, MD  ranolazine (RANEXA) 500 MG 12 hr tablet Take 1 tablet (500 mg total) by mouth 2 (two) times daily. 07/08/17  Yes Allie Bossier, MD  torsemide (DEMADEX) 20 MG tablet Take 1 tablet (20 mg total) by mouth daily. 07/08/17  Yes Allie Bossier, MD  traMADol (ULTRAM) 50 MG tablet Take 1 tablet (50 mg total) by mouth every 6 (six) hours as needed for moderate pain. 07/08/17  Yes Allie Bossier, MD  aspirin EC 81 MG EC tablet Take 1 tablet (81 mg total) by mouth daily. Patient not taking: Reported on 07/11/2017 11/19/14   Wellington Hampshire, MD  clopidogrel (PLAVIX) 75 MG tablet TAKE 1 TABLET BY MOUTH DAILY Patient not taking: Reported on 07/11/2017 02/23/17   Belva Crome, MD  HYDROcodone-acetaminophen [facility]) 10-325 MG tablet Take 1-2 tablets by mouth every 4 (four) hours as needed for moderate pain. Maximum dose per 24 hours - 8 pills Patient not taking: Reported on 07/01/2017 04/25/17   Kathie Rhodes, MD  insulin aspart (NOVOLOG FLEXPEN) 100 UNIT/ML FlexPen Inject 5 Units into the skin 3 (three) times daily with meals. Patient not taking: Reported on 07/11/2017 07/08/17   Allie Bossier, MD  Insulin Pen Needle (PEN NEEDLES 31GX5/16") 31G X 8 MM MISC 5 Units by Does not apply route 3 (three) times daily with meals. 07/08/17   Allie Bossier, MD  nitroGLYCERIN (NITROSTAT) 0.4 MG SL tablet PLACE 1 TABLET UNDER THE TONGUE EVERY 5 MINUTES AS NEEDED FOR CHEST PAIN 08/27/16   Belva Crome, MD    Physical Exam:  Constitutional: Elderly male who appears chronically ill  Vitals:   07/11/17 2108 07/11/17 2339 07/12/17 0333  BP: Marland Kitchen)  153/52 140/65 (!) 142/67  Pulse: 65 (!) 58 64  Resp: 16 16  16   Temp: 97.9 F (36.6 C)    TempSrc: Oral    SpO2: 99% 96% 95%   Eyes: PERRL, lids and conjunctivae normal ENMT: Mucous membranes are moist. Posterior pharynx clear of any exudate or lesions.Normal dentition.  Neck: normal, supple, no masses, no thyromegaly Respiratory: clear to auscultation bilaterally, no wheezing, no crackles. Normal respiratory effort. No accessory muscle use.  Cardiovascular: Regular rate and rhythm, no murmurs / rubs / gallops. No extremity edema. 2+ pedal pulses. No carotid bruits.  Abdomen: Mild suprapubic tenderness, no masses palpated. No hepatosplenomegaly. Bowel sounds positive.  Musculoskeletal: no clubbing / cyanosis. No joint deformity upper and lower extremities. Good ROM, no contractures. Normal muscle tone.  Skin: no rashes, lesions, ulcers. No induration Neurologic: CN 2-12 grossly intact. Sensation intact, DTR normal. Strength 5/5 in all 4.  Psychiatric: Normal judgment and insight. Alert and oriented x 3. Normal mood.     Labs on Admission: I have personally reviewed following labs and imaging studies  CBC:  Recent Labs Lab 07/06/17 0255 07/07/17 0335 07/08/17 0229 07/12/17 0017 07/12/17 0034  WBC 12.9* 11.7* 13.9*  --  12.7*  NEUTROABS  --   --   --   --  9.4*  HGB 9.2* 9.0* 9.2* 6.5* 8.3*  HCT 28.2* 27.2* 27.7* 19.0* 24.1*  MCV 81.5 81.4 81.2  --  81.1  PLT 252 258 272  --  626   Basic Metabolic Panel:  Recent Labs Lab 07/06/17 0255 07/07/17 0335 07/08/17 0229 07/12/17 0017 07/12/17 0034  NA 136 135 133* 135 134*  K 4.8 4.2 4.4 4.4 4.3  CL 99* 101 99* 100* 101  CO2 28 25 26   --  25  GLUCOSE 186* 169* 164* 153* 147*  BUN 39* 41* 42* 49* 52*  CREATININE 2.30* 2.26* 2.43* 2.50* 2.44*  CALCIUM 9.0 8.6* 8.7*  --  8.7*  MG 1.8 1.8 1.8  --   --    GFR: Estimated Creatinine Clearance: 23.6 mL/min (A) (by C-G formula based on SCr of 2.44 mg/dL (H)). Liver Function Tests: No results for input(s): AST, ALT, ALKPHOS, BILITOT,  PROT, ALBUMIN in the last 168 hours. No results for input(s): LIPASE, AMYLASE in the last 168 hours. No results for input(s): AMMONIA in the last 168 hours. Coagulation Profile:  Recent Labs Lab 07/12/17 0034  INR 1.07   Cardiac Enzymes: No results for input(s): CKTOTAL, CKMB, CKMBINDEX, TROPONINI in the last 168 hours. BNP (last 3 results) No results for input(s): PROBNP in the last 8760 hours. HbA1C: No results for input(s): HGBA1C in the last 72 hours. CBG:  Recent Labs Lab 07/06/17 1654 07/06/17 2137 07/07/17 1122 07/07/17 1639 07/08/17 1617  GLUCAP 177* 159* 185* 157* 170*   Lipid Profile: No results for input(s): CHOL, HDL, LDLCALC, TRIG, CHOLHDL, LDLDIRECT in the last 72 hours. Thyroid Function Tests: No results for input(s): TSH, T4TOTAL, FREET4, T3FREE, THYROIDAB in the last 72 hours. Anemia Panel: No results for input(s): VITAMINB12, FOLATE, FERRITIN, TIBC, IRON, RETICCTPCT in the last 72 hours. Urine analysis:    Component Value Date/Time   COLORURINE BROWN (A) 07/11/2017 0244   APPEARANCEUR CLOUDY (A) 07/11/2017 0244   LABSPEC 1.020 07/11/2017 0244   PHURINE 6.5 07/11/2017 0244   GLUCOSEU NEGATIVE 07/11/2017 0244   HGBUR LARGE (A) 07/11/2017 0244   BILIRUBINUR SMALL (A) 07/11/2017 0244   KETONESUR NEGATIVE 07/11/2017 0244   PROTEINUR >300 (A) 07/11/2017  0244   UROBILINOGEN 1.0 03/16/2015 2130   NITRITE POSITIVE (A) 07/11/2017 0244   LEUKOCYTESUR MODERATE (A) 07/11/2017 0244   Sepsis Labs: No results found for this or any previous visit (from the past 240 hour(s)).   Radiological Exams on Admission: No results found.    Assessment/Plan Hematuria with obstructive uropathy, squamous cell carcinoma of the bladder:Acute. Patient presents with obstructed indwelling Foley catheter secondary to hematuria related with history of squamous cell carcinoma of the bladder. Patient hold by Dr.Shadad of oncology and reports evaluation for possible need of -  Admit to telemetry bed - Consider increase in Foley catheter size from 22 to 24 - Continuous bladder irrigation - Continue tramadol prn pain - may want to have urology see patient while hospitalized - Contact Dr. Alen Blew in a.m. as he has an appointment   Acute blood loss anemia: Hemoglobin hemoglobin was 9.2 on 9/21, but acutely presents with hemoglobin drop that was initially thought to be 6.9 on ice that, but later noted to be 8.3 on CBC. Patient was ordered to be transfused 1 unit of packed red blood cells while in the ED. - recheck Hemoglobin following transfusion    Abnormal UA: Previous urine culture was positive for multiple species collected on 9/14. Previous cultures from 2016 positive for Escherichia coli and enterococcus. - Follow-up urine culture - Cefepime per pharmacy  Acute kidney injury on chronic kidney disease stage III: Appears patient's baseline creatinine previously been between 1.7-2. He presents with a creatinine elevated up to 2.44 with BUN of 52 - Check renal ultrasound  - IV fluids at 75 ml/hr as tolerated  Leukocytosis:WBC elevated at 12.7. On admission but appears to have been elevated since 9/14. Suspect symptoms could be secondary to a untreated urinary tract infection - Continue to monitor  Coronary artery disease s/p CABG - Hold Plavix and aspirin  Acute kidney injury on Chronic kidney disease stage III: Patient presents with a creatinine of 2.5 baseline creatinine 1.7- 2. - Recheck CBC in a.m.  Essential hypertension - Continue amlodipine and Imdur - Hold torsemide  Diabetes mellitus type 2: Last hemoglobin A1c 8.8 on 04/21/2017. - Hypoglycemic protocols - Continue home regimen of Lantus - CBGs every before meals with sensitive SSI   Anxiety/insomnia - Ativan prn anxiety  Hyperlipidemia - Continue atorvastatin  DVT prophylaxis: SCDs Code Status: DNR Family Communication: Discussed plan of care with patient and family present at  bedside Disposition Plan: Likely discharge home Consults called: none  Admission status: observation  Norval Morton MD [facility] Pager [phone]   If 7PM-7AM, please contact night-coverage [email or web address] Password [facility]  07/12/2017, 4:45 AM

## 2017-07-12 NOTE — ED Notes (Signed)
Please call report to Chloe at [phone]. Phone # [phone]

## 2017-07-12 NOTE — ED Notes (Signed)
Abnormal lab result MD Tomi Bamberger and PA [place] have been made aware

## 2017-07-13 ENCOUNTER — Ambulatory Visit
Admit: 2017-07-13 | Discharge: 2017-07-13 | Disposition: A | Discharge status: Home / Self Care | Attending: Radiation Oncology | Admitting: Radiation Oncology

## 2017-07-13 DIAGNOSIS — T839XXD Unspecified complication of genitourinary prosthetic device, implant and graft, subsequent encounter: Secondary | ICD-10-CM

## 2017-07-13 LAB — BASIC METABOLIC PANEL
ANION GAP: 10 (ref 5–15)
BUN: 45 mg/dL — ABNORMAL HIGH (ref 6–20)
CALCIUM: 8.7 mg/dL — AB (ref 8.9–10.3)
CHLORIDE: 106 mmol/L (ref 101–111)
CO2: 22 mmol/L (ref 22–32)
CREATININE: 2.17 mg/dL — AB (ref 0.61–1.24)
GFR calc non Af Amer: 28 mL/min — ABNORMAL LOW (ref >60)
GFR, EST AFRICAN AMERICAN: 32 mL/min — AB (ref >60)
Glucose, Bld: 137 mg/dL — ABNORMAL HIGH (ref 65–99)
Potassium: 4.6 mmol/L (ref 3.5–5.1)
SODIUM: 138 mmol/L (ref 135–145)

## 2017-07-13 LAB — GLUCOSE, CAPILLARY
GLUCOSE-CAPILLARY: 290 mg/dL — AB (ref 65–99)
GLUCOSE-CAPILLARY: 261 mg/dL — AB (ref 65–99)
Glucose-Capillary: 125 mg/dL — ABNORMAL HIGH (ref 65–99)
Glucose-Capillary: 277 mg/dL — ABNORMAL HIGH (ref 65–99)

## 2017-07-13 LAB — CBC
HCT: 28.5 % — ABNORMAL LOW (ref 39.0–52.0)
Hemoglobin: 9.7 g/dL — ABNORMAL LOW (ref 13.0–17.0)
MCH: 27.6 pg (ref 26.0–34.0)
MCHC: 34 g/dL (ref 30.0–36.0)
MCV: 81.2 fL (ref 78.0–100.0)
PLATELETS: 288 10*3/uL (ref 150–400)
RBC: 3.51 MIL/uL — AB (ref 4.22–5.81)
RDW: 14.8 % (ref 11.5–15.5)
WBC: 11.7 10*3/uL — AB (ref 4.0–10.5)

## 2017-07-13 NOTE — Discharge Summary (Signed)
Physician Discharge Summary  [name] CHE:[id number] DOB: 16-Feb-1941 DOA: 07/11/2017  PCP: Shirline Frees, MD  Admit date: 07/11/2017 Discharge date: 07/14/2017  Admitted From: Home Disposition:  Home  Recommendations for Outpatient Follow-up:  1. Follow up with PCP in 1-2 weeks    Discharge Condition:Stable CODE STATUS:DNR Diet recommendation: Regular   Brief/Interim Summary: 75/M with stage 4 bladder CA, chronic obstructive uropathy with Foley catheter, AFib, CKD3-4, DM2, COPD, CABG just discharged from [facility] on 9/21 has continued to have ongoing issues with hematuria and clots causing obstruction of the catheter.  Hematuria with obstructive uropathy, squamous cell carcinoma of the bladder:Acute. Patient presents with obstructed indwelling Foley catheter secondary to hematuria related with history of squamous cell carcinoma of the bladder.  -Patient was started on continuous bladder irrigation per urology. -Oncology was consulted (Dr. Alen Blew) with recommendations for radiation therapy. -No signs of infection were noted, the superior antibiotics were discontinued and has since remained stable -Pt stable for discharge per Urology following radiation tx.  Acute blood loss anemia: Hemoglobin hemoglobin was 9.2 on 9/21, but acutely presents with hemoglobin drop that was initially thought to be 6.9 on ice that, but later noted to be 8.3 on CBC. Patient was ordered to be transfused 1 unit of packed red blood cells while in the ED. -Patient remained hemodynamically stable this admission   Abnormal UA: Previous urine culture was positive for multiple species collected on 9/14. Previous cultures from 2016 positive for Escherichia coli and enterococcus. -Likely not florid urinary tract infection, therefore antibiotics were discontinued  Acute kidney injury on chronic kidney disease stage III: Appears patient's baseline creatinine previously been between 1.7-2. He presents  with a creatinine elevated up to 2.44 with BUN of 52 -Creatinine improved this admission  Leukocytosis:WBC elevated at 12.7. On admission but appears to have been elevated since 9/14. Suspect symptoms could be secondary to a untreated urinary tract infection - Remained stable  Coronary artery disease s/p CABG - Held Plavix and aspirin on admit -Pt to remain off antiplatelet per Urology. Discussed with patient, who plans to remain off as well, stating his focus is purely quality of live and not quantity  Acute kidney injury on Chronic kidney disease stage III: Patient presents with a creatinine of 2.5 baseline creatinine 1.7- 2.  Essential hypertension - Continued amlodipine and Imdur - Torsemide was placed on hold  Diabetes mellitus type 2: Last hemoglobin A1c 8.8 on 04/21/2017. 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 heart with angina pectoris (HCC)   Type 2 diabetes mellitus with vascular disease (HCC)   HTN (hypertension)   Hematuria   Acute blood loss anemia   Malignant neoplasm of urinary bladder (HCC)   Acute kidney injury superimposed on chronic kidney disease ([place])    Discharge Instructions   Allergies as of 07/14/2017      Reactions   Other Itching, Other (See Comments)   The iv dye used in Fundus Flourescein Angiography procedure  Kidney issues   Penicillins Hives   Augmentin Has patient had a PCN reaction causing immediate rash, facial/tongue/throat swelling, SOB or lightheadedness with hypotension: No Has patient had a PCN reaction causing severe rash involving mucus membranes or skin necrosis: No Has  patient had a PCN reaction that required  hospitalization: Yes Has patient had a PCN reaction occurring within the last 10 years: Yes If all of the above answers are "NO", then may proceed with Cephalosporin use.      Medication List    STOP taking these medications   aspirin 81 MG EC tablet   clopidogrel 75 MG tablet Commonly known as:  PLAVIX   HYDROcodone-acetaminophen 10-325 MG tablet Commonly known as:  NORCO     TAKE these medications   amLODipine 10 MG tablet Commonly known as:  NORVASC Take 10 mg by mouth every morning.   atorvastatin 20 MG tablet Commonly known as:  LIPITOR Take 20 mg by mouth daily at 6 PM.   cholecalciferol 400 units Tabs tablet Commonly known as:  VITAMIN D Take 400 Units by mouth at bedtime.   docusate sodium 100 MG capsule Commonly known as:  COLACE Take 1 capsule (100 mg total) by mouth 2 (two) times daily.   haloperidol 1 MG tablet Commonly known as:  HALDOL Take 1 mg by mouth every 8 (eight) hours as needed for agitation.   hydrALAZINE 50 MG tablet Commonly known as:  APRESOLINE Take 1 tablet (50 mg total) by mouth every 6 (six) hours.   insulin aspart 100 UNIT/ML FlexPen Commonly known as:  NOVOLOG FLEXPEN Inject 5 Units into the skin 3 (three) times daily with meals.   IRON 27 PO Take 1 tablet by mouth every evening.   isosorbide mononitrate 120 MG 24 hr tablet Commonly known as:  IMDUR Take 1 tablet (120 mg total) by mouth daily.   LANTUS SOLOSTAR 100 UNIT/ML Solostar Pen Generic drug:  Insulin Glargine Inject 14 Units into the skin daily.   LORazepam 0.5 MG tablet Commonly known as:  ATIVAN Take 0.5 mg by mouth every 4 (four) hours as needed for anxiety or sleep.   magnesium oxide 400 (241.3 Mg) MG tablet Commonly known as:  MAG-OX Take 400 mg by mouth every evening.   metoprolol tartrate 100 MG tablet Commonly known as:  LOPRESSOR Take 0.5 tablets (50 mg total) by mouth 2 (two) times daily.   nitroGLYCERIN 0.4 MG SL tablet Commonly known as:   NITROSTAT PLACE 1 TABLET UNDER THE TONGUE EVERY 5 MINUTES AS NEEDED FOR CHEST PAIN   pantoprazole 40 MG tablet Commonly known as:  PROTONIX Take 1 tablet (40 mg total) by mouth daily. What changed:  when to take this   PEN NEEDLES 31GX5/16" 31G X 8 MM Misc 5 Units by Does not apply route 3 (three) times daily with meals.   ranolazine 500 MG 12 hr tablet Commonly known as:  RANEXA Take 1 tablet (500 mg total) by mouth 2 (two) times daily.   TART CHERRY ADVANCED Caps Take 1 capsule by mouth daily.   torsemide 20 MG tablet Commonly known as:  DEMADEX Take 1 tablet (20 mg total) by mouth daily.   traMADol 50 MG tablet Commonly known as:  ULTRAM Take 1 tablet (50 mg total) by mouth every 6 (six) hours as needed for moderate pain.      Follow-up Information    Shirline Frees, MD. Schedule an appointment as soon as possible for a visit in 1 week(s).   Specialty:  Family Medicine Contact information: [place] [address] [phone]        Follow up with Urology as scheduled. Schedule an appointment as soon as possible for a visit.  Allergies  Allergen Reactions  . Other Itching and Other (See Comments)    The iv dye used in Fundus Flourescein Angiography procedure  Kidney issues  . Penicillins Hives    Augmentin Has patient had a PCN reaction causing immediate rash, facial/tongue/throat swelling, SOB or lightheadedness with hypotension: No Has patient had a PCN reaction causing severe rash involving mucus membranes or skin necrosis: No Has patient had a PCN reaction that required hospitalization: Yes Has patient had a PCN reaction occurring within the last 10 years: Yes If all of the above answers are "NO", then may proceed with Cephalosporin use.     Consultations:  Urology  Radiation Oncology  Oncology  Procedures/Studies: US Renal  Result Date: 07/02/2017 CLINICAL DATA:  Gross hematuria. EXAM: RENAL / URINARY  TRACT ULTRASOUND COMPLETE COMPARISON:  PET-CT dated June 07, 2017. Renal ultrasound dated December 25, 2014. FINDINGS: Right Kidney: Length: 10.2 cm. Echogenicity within normal limits. No mass or hydronephrosis visualized. Left Kidney: Length: 11.2 cm. Echogenicity within normal limits. Mild hydronephrosis. No mass visualized. Bladder: Decompressed by Foley catheter. IMPRESSION: Mild left hydronephrosis, similar to recent PET-CT, allowing for differences in technique. Electronically Signed   By: Titus Dubin M.D.   On: 07/02/2017 10:42   Dg Chest Port 1 View  Result Date: 07/03/2017 CLINICAL DATA:  Shortness of breath EXAM: PORTABLE CHEST 1 VIEW COMPARISON:  07/01/2017 and previous FINDINGS: Previous median sternotomy and CABG. The heart is enlarged. There is interstitial pulmonary edema. No infiltrate, consolidation or collapse. No effusion. IMPRESSION: New development of interstitial pulmonary edema. Electronically Signed   By: Nelson Chimes M.D.   On: 07/03/2017 13:48   Dg Chest Port 1 View  Result Date: 07/01/2017 CLINICAL DATA:  History of bladder cancer. EXAM: PORTABLE CHEST 1 VIEW COMPARISON:  PET-CT 06/07/2017. FINDINGS: Prior CABG. Cardiomegaly with pulmonary vascular prominence. Mild interstitial prominence. Mild component congestive heart failure cannot be excluded. No pleural effusion or pneumothorax. IMPRESSION: Prior CABG. A mild component of congestive heart failure cannot be excluded. Electronically Signed   By: Marcello Moores  Register   On: 07/01/2017 15:02    Subjective: Eager to go home today.  Discharge Exam: Vitals:   07/13/17 1256 07/13/17 2047  BP: (!) 175/54 (!) 153/47  Pulse: (!) 106 96  Resp: 16 16  Temp: 97.9 F (36.6 C) 98.6 F (37 C)  SpO2: 96% 95%   Vitals:   07/13/17 0300 07/13/17 1200 07/13/17 1256 07/13/17 2047  BP: (!) 154/52 (!) 174/54 (!) 175/54 (!) 153/47  Pulse: 83  (!) 106 96  Resp: 15  16 16   Temp: 99.6 F (37.6 C)  97.9 F (36.6 C) 98.6 F (37 C)   TempSrc: Oral  Oral Oral  SpO2: 96%  96% 95%  Weight:      Height:        General: Pt is alert, awake, not in acute distress Cardiovascular: RRR, S1/S2 +, no rubs, no gallops Respiratory: CTA bilaterally, no wheezing, no rhonchi Abdominal: Soft, NT, ND, bowel sounds + Extremities: no edema, no cyanosis   The results of significant diagnostics from this hospitalization (including imaging, microbiology, ancillary and laboratory) are listed below for reference.     Microbiology: Recent Results (from the past 240 hour(s))  Urine culture     Status: None   Collection Time: 07/11/17  2:44 AM  Result Value Ref Range Status   Specimen Description URINE, CATHETERIZED  Final   Special Requests NONE  Final   Culture   Final  NO GROWTH Performed at [facility], [place] [address], [place], [place] [address]    Report Status 07/12/2017 FINAL  Final     Labs: BNP (last 3 results) No results for input(s): BNP in the last 8760 hours. Basic Metabolic Panel:  Recent Labs Lab 07/08/17 0229 07/12/17 0017 07/12/17 0034 07/13/17 0547  NA 133* 135 134* 138  K 4.4 4.4 4.3 4.6  CL 99* 100* 101 106  CO2 26  --  25 22  GLUCOSE 164* 153* 147* 137*  BUN 42* 49* 52* 45*  CREATININE 2.43* 2.50* 2.44* 2.17*  CALCIUM 8.7*  --  8.7* 8.7*  MG 1.8  --   --   --    Liver Function Tests: No results for input(s): AST, ALT, ALKPHOS, BILITOT, PROT, ALBUMIN in the last 168 hours. No results for input(s): LIPASE, AMYLASE in the last 168 hours. No results for input(s): AMMONIA in the last 168 hours. CBC:  Recent Labs Lab 07/08/17 0229 07/12/17 0017 07/12/17 0034 07/12/17 1123 07/13/17 0547  WBC 13.9*  --  12.7* 12.0* 11.7*  NEUTROABS  --   --  9.4*  --   --   HGB 9.2* 6.5* 8.3* 9.3* 9.7*  HCT 27.7* 19.0* 24.1* 27.3* 28.5*  MCV 81.2  --  81.1 80.8 81.2  PLT 272  --  280 268 288   Cardiac Enzymes: No results for input(s): CKTOTAL, CKMB, CKMBINDEX, TROPONINI in the last 168  hours. BNP: Invalid input(s): POCBNP CBG:  Recent Labs Lab 07/12/17 2132 07/13/17 0755 07/13/17 1147 07/13/17 1717 07/13/17 2050  GLUCAP 130* 125* 277* 290* 261*   D-Dimer No results for input(s): DDIMER in the last 72 hours. Hgb A1c No results for input(s): HGBA1C in the last 72 hours. Lipid Profile No results for input(s): CHOL, HDL, LDLCALC, TRIG, CHOLHDL, LDLDIRECT in the last 72 hours. Thyroid function studies No results for input(s): TSH, T4TOTAL, T3FREE, THYROIDAB in the last 72 hours.  Invalid input(s): FREET3 Anemia work up No results for input(s): VITAMINB12, FOLATE, FERRITIN, TIBC, IRON, RETICCTPCT in the last 72 hours. Urinalysis    Component Value Date/Time   COLORURINE BROWN (A) 07/11/2017 0244   APPEARANCEUR CLOUDY (A) 07/11/2017 0244   LABSPEC 1.020 07/11/2017 0244   PHURINE 6.5 07/11/2017 0244   GLUCOSEU NEGATIVE 07/11/2017 0244   HGBUR LARGE (A) 07/11/2017 0244   BILIRUBINUR SMALL (A) 07/11/2017 0244   KETONESUR NEGATIVE 07/11/2017 0244   PROTEINUR >300 (A) 07/11/2017 0244   UROBILINOGEN 1.0 03/16/2015 2130   NITRITE POSITIVE (A) 07/11/2017 0244   LEUKOCYTESUR MODERATE (A) 07/11/2017 0244   Sepsis Labs Invalid input(s): PROCALCITONIN,  WBC,  LACTICIDVEN Microbiology Recent Results (from the past 240 hour(s))  Urine culture     Status: None   Collection Time: 07/11/17  2:44 AM  Result Value Ref Range Status   Specimen Description URINE, CATHETERIZED  Final   Special Requests NONE  Final   Culture   Final    NO GROWTH Performed at [facility], [place] [address] [place], [place] [address]    Report Status 07/12/2017 FINAL  Final     SIGNED:   Donne Hazel, MD  [facility] 07/14/2017, 12:12 PM  If 7PM-7AM, please contact night-coverage [email or web address] Password [email or web address]

## 2017-07-13 NOTE — Consult Note (Signed)
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 male.   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 Karsten Ro. Marland Kitchen Lesion centered Rt trigone at area of diverticulum close to Rt UO. Prostate stromal invasion also present. PET-CT 05/2017 with likely oligometastatic disease. Not candidate for chemo given poor GFR. Was initially considering cystecomy, but functional status has declined severly and now on palliative course with continuous foley, home hospice, and palliative radiation (20Gy over 4 fractions) to start this admission.   2- Malignant Ureteral Obstruction / Stage 3 Chronic Renal Insufficiency - Cr 1.8-2 x years. Known diabetic, HTN, PAD. New mild Rt hydro to UVJ tumor 2018 now managed with antegrade JJ stent.    Today "[name]" is seen in consultation for above. His hematuria is improving but persistant after first 2 fractions of radiation.    Past Medical History:  Diagnosis Date  . Anemia    a. + FOBT 08/2014.  Marland Kitchen Anginal pain ([place])    comes and goes; uses nitro to relieve   . Arthritis   . Atherosclerosis of native arteries of the extremities with intermittent claudication    a. L external iliac stent 2001, bilateral SFA occlusions documented 2001.  . Atrial fibrillation ([place])   . Bladder cancer ([place])   . CAD (coronary artery disease), native coronary artery 09/24/2013   a. CABG x 6 1999. b. Interim PCIs: Angioplasty SVG-PDA 2006.Taxus DES to native LCx in 2007.DES to SVG-PDA 2007, restented 2009.NSTEMI with PCI to SVG- PDA complicated by no-reflow/inferior infarction in 2012. c. Redo CABG 2015 with SVG-PD, SVG-D1, SVG-OM1.  . Carotid artery occlusion    a. Duplex 12/2013: mild plaque bilat, 40-59% BICA. F/u due 12/2014.  Marland Kitchen  Chronic combined systolic and diastolic CHF (congestive heart failure) ([place])    a. Prior EF 45-50% in 08/2014. b. Improved EF >55% in 09/2014 at [facility].  . Chronic lower back pain   . CKD (chronic kidney disease) stage 3, GFR 30-59 ml/min   . COPD (chronic obstructive pulmonary disease) ([place])    pt states he doesn't have  . Daily headache    comes and goes pt denies daily currently   . Dysrhythmia   . Esophageal stricture    a. History of dilitation - Long-standing history of esophageal reflux   . Gait instability   . GERD (gastroesophageal reflux disease)   . Gout   . History of bronchitis   . History of gout   . History of hiatal hernia   . Hx of CABG 09/24/2013   a. 1999: LIMA to LAD, sequential SVG to diagonal 2 and diagonal 3, sequential SVG to PDA, acute marginal branch, and PL branch. b. Repeat CABG 2015 with SVG to D1, SVG to PDA, and SVG to OM.   Marland Kitchen Hyperlipidemia   . Hypertension   . Hypotonic bladder    Requiring in and out catheterization performed by the patient at home - occasional blood tinged urine (self-cath's daily for the last 2 years) and hematuria is chronic for him, followed by urology.  . MI (myocardial infarction) ([place]) 1999-02/2014   total of 7 MI per patient  . OSA (obstructive sleep apnea)    "don't wear mask"   . Polymyalgia rheumatica ([place])   . Self-catheterizes urinary bladder   .  Shortness of breath dyspnea   . Skin cancer   . SVT (supraventricular tachycardia) ([place])   . Type II diabetes mellitus ([place])     Past Surgical History:  Procedure Laterality Date  . ABDOMINAL AORTAGRAM N/A 11/27/2014   Procedure: ABDOMINAL Maxcine Ham;  Surgeon: Wellington Hampshire, MD;  Location: [place] CATH LAB;  Service: Cardiovascular;  Laterality: N/A;  . ANKLE FRACTURE SURGERY Right ~ 1977   "10 plates and 7 screws placed in it"  . ANTERIOR CERVICAL DECOMP/DISCECTOMY FUSION    . BACK SURGERY     neck surgery x 2  . CARDIAC CATHETERIZATION    . CATARACT EXTRACTION W/  INTRAOCULAR LENS  IMPLANT, BILATERAL Bilateral 2000's  . COLONOSCOPY    . CORONARY ARTERY BYPASS GRAFT  1999  . CORONARY ARTERY BYPASS GRAFT N/A 03/01/2014   Procedure: REDO CORONARY ARTERY BYPASS GRAFTING TIMES THREE, USING RIGHT GREATER SAPHENOUS VEIN VIA ENDOVEIN HARVEST.;  Surgeon: Gaye Pollack, MD;  Location: [facility] OR;  Service: Open Heart Surgery;  Laterality: N/A;  . CORONARY STENT PLACEMENT     "I've had 8 put in; I presently have none" (08/26/2014)  . CYSTOSCOPY W/ RETROGRADES Bilateral 03/03/2015   Procedure: CYSTOSCOPY WITH LEFT RETROGRADE PYELOGRAM;  Surgeon: Kathie Rhodes, MD;  Location: [facility] OR[facility];  Service: Urology;  Laterality: Bilateral;  . CYSTOSCOPY WITH BIOPSY N/A 04/25/2017   Procedure: CYSTOSCOPY WITH BLADDER BIOPSY;  Surgeon: Kathie Rhodes, MD;  Location: [facility];  Service: Urology;  Laterality: N/A;  . ENDARTERECTOMY Left 01/30/2015   Procedure: ENDARTERECTOMY CAROTID WITH PRIMARY CLOSURE OF ARTERY;  Surgeon: Angelia Mould, MD;  Location: [facility] OR;  Service: Vascular;  Laterality: Left;  . ESOPHAGOGASTRODUODENOSCOPY (EGD) WITH ESOPHAGEAL DILATION  X 1  . EYE SURGERY     cataract surgery bilat   . FRACTURE SURGERY Right    ankle  . INTRAOPERATIVE TRANSESOPHAGEAL ECHOCARDIOGRAM N/A 03/01/2014   Procedure: INTRAOPERATIVE TRANSESOPHAGEAL ECHOCARDIOGRAM;  Surgeon: Gaye Pollack, MD;  Location: [facility] OR;  Service: Open Heart Surgery;  Laterality: N/A;  . IR NEPHROSTOMY PLACEMENT RIGHT  05/11/2017  . IR URETERAL STENT PLACEMENT EXISTING ACCESS RIGHT  05/25/2017  . LEFT HEART CATHETERIZATION WITH CORONARY/GRAFT ANGIOGRAM  02/26/2014   Procedure: LEFT HEART CATHETERIZATION WITH Beatrix Fetters;  Surgeon: Sinclair Grooms, MD;  Location: [facility] CATH LAB;  Service: Cardiovascular;;  . LEFT HEART CATHETERIZATION WITH CORONARY/GRAFT ANGIOGRAM N/A 07/23/2014   Procedure: LEFT HEART CATHETERIZATION WITH Beatrix Fetters;  Surgeon: Sinclair Grooms, MD;  Location: [facility] CATH LAB;   Service: Cardiovascular;  Laterality: N/A;  . LEFT HEART CATHETERIZATION WITH CORONARY/GRAFT ANGIOGRAM N/A 08/30/2014   Procedure: LEFT HEART CATHETERIZATION WITH Beatrix Fetters;  Surgeon: Leonie Man, MD;  Location: [facility] CATH LAB;  Service: Cardiovascular;  Laterality: N/A;  . MOHS SURGERY  X 2   "nose; nose"  . MULTIPLE TOOTH EXTRACTIONS    . SKIN CANCER EXCISION     "all over my head; ears; back  . TONSILLECTOMY    . TRANSURETHRAL RESECTION OF BLADDER TUMOR WITH GYRUS (TURBT-GYRUS) N/A 03/03/2015   Procedure: TRANSURETHRAL RESECTION OF BLADDER TUMOR;  Surgeon: Kathie Rhodes, MD;  [facility];  Service: Urology;  Laterality: N/A;  . TRANSURETHRAL RESECTION OF BLADDER TUMOR WITH GYRUS (TURBT-GYRUS) N/A 04/28/2015   Procedure: TRANSURETHRAL RESECTION OF BLADDER TUMOR ;  Surgeon: Kathie Rhodes, MD;  Location[facility];  Service: Urology;  Laterality: N/A;  . TRANSURETHRAL RESECTION OF BLADDER TUMOR WITH GYRUS (TURBT-GYRUS) N/A 11/24/2015   Procedure: TRANSURETHRAL  RESECTION OF BLADDER TUMOR WITH GYRUS (TURBT-GYRUS);  Surgeon: Kathie Rhodes, MD;  [facility];  Service: Urology;  Laterality: N/A;  . TRANSURETHRAL RESECTION OF PROSTATE N/A 04/25/2017   Procedure: TRANSURETHRAL RESECTION OF THE PROSTATE (TURP);  Surgeon: Kathie Rhodes, MD;  [facility];  Service: Urology;  Laterality: N/A;    Family History  Problem Relation Age of Onset  . CAD Mother   . Heart disease Mother   . Hypertension Mother   . Cirrhosis Brother   . Heart disease Father   . Hyperlipidemia Father   . Hypertension Father   . Heart attack Father     Social History:  reports that he quit smoking about 21 months ago. His smoking use included Cigarettes. He has a 100.00 pack-year smoking history. He has never used smokeless tobacco. He reports that he does not drink alcohol or use drugs.  Allergies:  Allergies  Allergen Reactions  . Other Itching and Other (See Comments)    The iv dye used in Fundus  Flourescein Angiography procedure  Kidney issues  . Penicillins Hives    Augmentin Has patient had a PCN reaction causing immediate rash, facial/tongue/throat swelling, SOB or lightheadedness with hypotension: No Has patient had a PCN reaction causing severe rash involving mucus membranes or skin necrosis: No Has patient had a PCN reaction that required hospitalization: Yes Has patient had a PCN reaction occurring within the last 10 years: Yes If all of the above answers are "NO", then may proceed with Cephalosporin use.     Medications: I have reviewed the patient's current medications.  Results for orders placed or performed during the hospital encounter of 07/11/17 (from the past 48 hour(s))  I-stat Chem 8, ED     Status: Abnormal   Collection Time: 07/12/17 12:17 AM  Result Value Ref Range   Sodium 135 135 - 145 mmol/L   Potassium 4.4 3.5 - 5.1 mmol/L   Chloride 100 (L) 101 - 111 mmol/L   BUN 49 (H) 6 - 20 mg/dL   Creatinine, Ser 2.50 (H) 0.61 - 1.24 mg/dL   Glucose, Bld 153 (H) 65 - 99 mg/dL   Calcium, Ion 1.19 1.15 - 1.40 mmol/L   TCO2 25 22 - 32 mmol/L   Hemoglobin 6.5 (LL) 13.0 - 17.0 g/dL   HCT 19.0 (L) 39.0 - 52.0 %   Comment NOTIFIED PHYSICIAN   CBC with Differential     Status: Abnormal   Collection Time: 07/12/17 12:34 AM  Result Value Ref Range   WBC 12.7 (H) 4.0 - 10.5 K/uL   RBC 2.97 (L) 4.22 - 5.81 MIL/uL   Hemoglobin 8.3 (L) 13.0 - 17.0 g/dL    Comment: DELTA CHECK NOTED REPEATED TO VERIFY    HCT 24.1 (L) 39.0 - 52.0 %   MCV 81.1 78.0 - 100.0 fL   MCH 27.9 26.0 - 34.0 pg   MCHC 34.4 30.0 - 36.0 g/dL   RDW 14.8 11.5 - 15.5 %   Platelets 280 150 - 400 K/uL   Neutrophils Relative % 74 %   Neutro Abs 9.4 (H) 1.7 - 7.7 K/uL   Lymphocytes Relative 13 %   Lymphs Abs 1.7 0.7 - 4.0 K/uL   Monocytes Relative 11 %   Monocytes Absolute 1.3 (H) 0.1 - 1.0 K/uL   Eosinophils Relative 2 %   Eosinophils Absolute 0.2 0.0 - 0.7 K/uL   Basophils Relative 0 %    Basophils Absolute 0.0 0.0 - 0.1 K/uL  Basic metabolic  panel     Status: Abnormal   Collection Time: 07/12/17 12:34 AM  Result Value Ref Range   Sodium 134 (L) 135 - 145 mmol/L   Potassium 4.3 3.5 - 5.1 mmol/L   Chloride 101 101 - 111 mmol/L   CO2 25 22 - 32 mmol/L   Glucose, Bld 147 (H) 65 - 99 mg/dL   BUN 52 (H) 6 - 20 mg/dL   Creatinine, Ser 1.10 (H) 0.61 - 1.24 mg/dL   Calcium 8.7 (L) 8.9 - 10.3 mg/dL   GFR calc non Af Amer 24 (L) >60 mL/min   GFR calc Af Amer 28 (L) >60 mL/min    Comment: (NOTE) The eGFR has been calculated using the CKD EPI equation. This calculation has not been validated in all clinical situations. eGFR's persistently <60 mL/min signify possible Chronic Kidney Disease.    Anion gap 8 5 - 15  Type and screen [facility]     Status: None (Preliminary result)   Collection Time: 07/12/17 12:34 AM  Result Value Ref Range   ABO/RH(D) O POS    Antibody Screen NEG    Sample Expiration 07/15/2017    Unit Number [id number]    Blood Component Type RED CELLS,LR    Unit division 00    Status of Unit ISSUED    Transfusion Status OK TO TRANSFUSE    Crossmatch Result Compatible    Unit Number [id number]    Blood Component Type RED CELLS,LR    Unit division 00    Status of Unit ALLOCATED    Transfusion Status OK TO TRANSFUSE    Crossmatch Result Compatible   Protime-INR     Status: None   Collection Time: 07/12/17 12:34 AM  Result Value Ref Range   Prothrombin Time 13.8 11.4 - 15.2 seconds   INR 1.07   Prepare RBC     Status: None   Collection Time: 07/12/17 12:34 AM  Result Value Ref Range   Order Confirmation ORDER PROCESSED BY BLOOD BANK   Glucose, capillary     Status: Abnormal   Collection Time: 07/12/17  7:47 AM  Result Value Ref Range   Glucose-Capillary 171 (H) 65 - 99 mg/dL  CBC     Status: Abnormal   Collection Time: 07/12/17 11:23 AM  Result Value Ref Range   WBC 12.0 (H) 4.0 - 10.5 K/uL   RBC 3.38 (L) 4.22 - 5.81  MIL/uL   Hemoglobin 9.3 (L) 13.0 - 17.0 g/dL   HCT 79.7 (L) 28.2 - 06.0 %   MCV 80.8 78.0 - 100.0 fL   MCH 27.5 26.0 - 34.0 pg   MCHC 34.1 30.0 - 36.0 g/dL   RDW 15.6 15.3 - 79.4 %   Platelets 268 150 - 400 K/uL  Glucose, capillary     Status: Abnormal   Collection Time: 07/12/17 11:45 AM  Result Value Ref Range   Glucose-Capillary 307 (H) 65 - 99 mg/dL  Glucose, capillary     Status: Abnormal   Collection Time: 07/12/17  4:30 PM  Result Value Ref Range   Glucose-Capillary 197 (H) 65 - 99 mg/dL  Glucose, capillary     Status: Abnormal   Collection Time: 07/12/17  9:32 PM  Result Value Ref Range   Glucose-Capillary 130 (H) 65 - 99 mg/dL    No results found.  Review of Systems  Constitutional: Negative.   HENT: Negative.   Eyes: Negative.   Respiratory: Negative.   Cardiovascular: Negative.   Gastrointestinal: Negative.  Genitourinary: Positive for hematuria.  Skin: Negative.    Blood pressure (!) 154/52, pulse 83, temperature 99.6 F (37.6 C), temperature source Oral, resp. rate 15, height '5\' 6"'$  (1.676 m), weight 70.9 kg (156 lb 4.9 oz), SpO2 96 %. Physical Exam  Constitutional: He appears well-developed.  Stigmata of chronic diseas, but in good spirits.   HENT:  Head: Normocephalic.  Eyes: Pupils are equal, round, and reactive to light.  Neck: Normal range of motion.  Cardiovascular: Normal rate.   Respiratory: Effort normal.  GI: Soft.  Genitourinary: Penis normal.  Genitourinary Comments: 3 way foley in place with grossly clear urine on very slow bladder irrigation.   Neurological: He is alert.  Skin: Skin is warm.    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 His functional status is declining. He is very accepting of this and I agree with palliative transiiton. His goal is to be at home around his family if possible for few weeks.  With this goal in mind, I  feel palliative radiation to reduce bleeding is reasonable and appears to be helping. I rec he continue 3 way catheter at discharge as further in and out will only exacerbate pain and bleeding.  His wife is facile with PRN and continuous irrigation as needed.  I also feel reasonable to hold anticoagulation in face of hemodynamically significant ongoign gross bleeding. He understnads increased CV risk with that.    2- Malignant Ureteral Obstruction / Stage 3 Chronic Renal Insufficiency - GFR at baseline. No indication for additional tubes / drainage given palliative course.   Kensington Duerst 07/13/2017, 6:06 AM

## 2017-07-13 NOTE — Discharge Instructions (Signed)

## 2017-07-13 NOTE — Consult Note (Signed)
[facility]: Pt awake and oriented. He has returned from his 2nd radiation treatment. His urine is pink tinged now and he states he is feeling good.  We have ordered the CBI for our hospice home and that will arrive tomorrow. The pt and family in agreement to going home when discharged and wife will continue to irrigate at home if needed and Hospice staff as well when needed. Plan to follow pt while in hospital and resume hospice care at home. Will be able to offer CBI at Hospice home after tomorrow. Webb Silversmith RN

## 2017-07-13 NOTE — Progress Notes (Signed)
Initial Nutrition Assessment  DOCUMENTATION CODES:   Not applicable  INTERVENTION:   Ensure Enlive po BID, each supplement provides 350 kcal and 20 grams of protein  NUTRITION DIAGNOSIS:   Increased nutrient needs related to catabolic illness, cancer and cancer related treatments as evidenced by estimated needs.  GOAL:   Patient will meet greater than or equal to 90% of their needs  MONITOR:   PO intake, Supplement acceptance, Weight trends, Labs  REASON FOR ASSESSMENT:   Malnutrition Screening Tool    ASSESSMENT:   Pt with PMH significant for stg IV squamous cell bladder cancer, CKD III, DM, COPD< CAD s/p CABG, and obstructive uropathy with chronic catheter. Presents this admission with hematuria, obstructive uropathy, and acute blood  loss anemia. Pt plans to proceed with palliative radiotherapy to the bladder to help control gross hematuria.   Spoke with pt at bedside who reports having slight decrease in appetite over the last few weeks. States he continues to eat three meals a day, but the quantity decreased to ~75% meal completion. Pt does not consume protein supplementation at home. Amendable to Ensure this hospital stay. Discussed the importance of protein intake for preservation of lean body mass post discharged. Included protein supplement information in discharge instructions.   Pt admits to 5 lbs wt loss over the last few weeks. Records indicate pt has lost lost 3% of body wt in 2 months. This percentage in this time frame is not significant.   Nutrition-Focused physical exam completed. Findings are no fat depletion, moderate muscle depletion in bilateral lower extremities, and no edema.   Medications reviewed and include: SSI, Mag oxide, NS @ 50 ml/hr Labs reviewed: CBG 137-164 BUN 45 (H) Creatinine 2.17 (H)   Diet Order:  Diet heart healthy/carb modified Room service appropriate? Yes; Fluid consistency: Thin  Skin:  Reviewed, no issues  Last BM:   07/06/17  Height:   Ht Readings from Last 1 Encounters:  07/12/17 5\' 6"  (1.676 m)    Weight:   Wt Readings from Last 1 Encounters:  07/12/17 156 lb 4.9 oz (70.9 kg)    Ideal Body Weight:  64.5 kg  BMI:  Body mass index is 25.23 kg/m.  Estimated Nutritional Needs:   Kcal:  1900-2100 (27-30 kcal/kg)  Protein:  105-115 grams (1.3-1.4 g/kg)  Fluid:  >1.9 L/day  EDUCATION NEEDS:   Education needs addressed  [place], LDN Clinical Nutrition Pager # [phone]

## 2017-07-13 NOTE — Progress Notes (Signed)
PROGRESS NOTE    [name]  ZJQ:[id number] DOB: 1941-08-22 DOA: 07/11/2017 PCP: Shirline Frees, MD    Brief Narrative:  75/M with stage 4 bladder CA, chronic obstructive uropathy with Foley catheter, AFib, CKD3-4, DM2, COPD, CABG just discharged from [facility] on 9/21 has continued to have ongoing issues with hematuria and clots causing obstruction of the catheter.  Assessment & Plan:   Principal Problem:   Obstructive uropathy Active Problems:   Coronary artery disease involving coronary bypass graft of native heart with angina pectoris (HCC)   Type 2 diabetes mellitus with vascular disease (HCC)   HTN (hypertension)   Hematuria   Acute blood loss anemia   Malignant neoplasm of urinary bladder (HCC)   Acute kidney injury superimposed on chronic kidney disease (HCC)  Hematuria with obstructive uropathy, squamous cell carcinoma of the bladder:Acute. Patient presents with obstructedindwelling Foley catheter secondary to hematuria related with history of squamous cell carcinoma of the bladder.  -Patient was started on continuous bladder irrigation per urology. -Oncology was consulted (Dr. Alen Blew) with recommendations for radiation therapy. -No signs of infection were noted, the superior antibiotics were discontinued and has since remained stable -Urology has recommended continued bladder irrigation at time of discharge. Also recommendation to hold ASA and plavix until cleared by Urology -D/c planning in progress  Acuteblood loss anemia: Hemoglobin hemoglobin was 9.2 on 9/21,but acutely presents with hemoglobin drop that was initially thought to be 6.9 on ice that, but later noted to be 8.3 on CBC. Patient was ordered to betransfused 1 unit of packed red blood cells while in the ED. -Patient remained hemodynamically stable  Abnormal UA: Previous urine culture was positive for multiple species collected on 9/14. Previous cultures from 2016 positive for Escherichia  coli and enterococcus. -Likely not florid urinary tract infection, therefore antibiotics were discontinued  Acute kidney injury on chronic kidney disease stage III: Appears patient's baseline creatinine previously been between 1.7-2. He presents with a creatinine elevated up to 2.44 with BUN of 52 -Creatinine improved to 2.17.  Leukocytosis:WBC elevated at 12.7. On admission but appears to have been elevated since 9/14. Suspect symptoms could be secondary to a untreated urinary tract infection - Remained stable  Coronary artery disease s/p CABG - Held Plavix and aspirin on admit. Per above  Acute kidney injury on Chronic kidney disease stage WIO:XBDZHGD presents with a creatinine of 2.5 baseline creatinine 1.7- 2.  Essential hypertension - Continued amlodipine and Imdur - Torsemide was placed on hold  Diabetes mellitus type 2:Last hemoglobin A1c 8.8 on 04/21/2017. - Hypoglycemic protocols - Continue home regimen of Lantus - CBGs every before meals with sensitive SSI  Anxiety/insomnia - Ativan prn anxiety  Hyperlipidemia - Continue atorvastatin -remained stable  DVT prophylaxis: SCD's Code Status: DNR Family Communication: Pt in room, family at bedside Disposition Plan: Pending d/c to hospice in 24hrs  Consultants:   Urology  Procedures:     Antimicrobials: Anti-infectives    Start     Dose/Rate Route Frequency Ordered Stop   07/13/17 0800  ceFEPIme (MAXIPIME) 1 g in dextrose 5 % 50 mL IVPB  Status:  Discontinued     1 g 100 mL/hr over 30 Minutes Intravenous Every 24 hours 07/12/17 0619 07/12/17 0947   07/12/17 0615  ceFEPIme (MAXIPIME) 2 g in dextrose 5 % 50 mL IVPB     2 g 100 mL/hr over 30 Minutes Intravenous  Once 07/12/17 0610 07/12/17 0959       Subjective: No complaints  Objective: Vitals:   07/12/17 1259 07/12/17 2133 07/13/17 0300 07/13/17 1256  BP: (!) 154/47 (!) 166/52 (!) 154/52 (!) 175/54  Pulse: 78 85 83 (!) 106  Resp: 18 16 15 16     Temp: 98 F (36.7 C) 99 F (37.2 C) 99.6 F (37.6 C) 97.9 F (36.6 C)  TempSrc: Oral Oral Oral Oral  SpO2: 96% 96% 96% 96%  Weight:      Height:        Intake/Output Summary (Last 24 hours) at 07/13/17 1638 Last data filed at 07/13/17 1300  Gross per 24 hour  Intake          1604.58 ml  Output             5555 ml  Net         -3950.42 ml   Filed Weights   07/12/17 0634  Weight: 70.9 kg (156 lb 4.9 oz)    Examination:  General exam: Appears calm and comfortable  Respiratory system: Clear to auscultation. Respiratory effort normal. Cardiovascular system: S1 & S2 heard, RRR.  Gastrointestinal system: Abdomen is nondistended, soft and nontender. No organomegaly or masses felt. Normal bowel sounds heard. Central nervous system: Alert and oriented. No focal neurological deficits. Extremities: Symmetric 5 x 5 power. Skin: No rashes, lesions  Psychiatry: Judgement and insight appear normal. Mood & affect appropriate.   Data Reviewed: I have personally reviewed following labs and imaging studies  CBC:  Recent Labs Lab 07/07/17 0335 07/08/17 0229 07/12/17 0017 07/12/17 0034 07/12/17 1123 07/13/17 0547  WBC 11.7* 13.9*  --  12.7* 12.0* 11.7*  NEUTROABS  --   --   --  9.4*  --   --   HGB 9.0* 9.2* 6.5* 8.3* 9.3* 9.7*  HCT 27.2* 27.7* 19.0* 24.1* 27.3* 28.5*  MCV 81.4 81.2  --  81.1 80.8 81.2  PLT 258 272  --  280 268 497   Basic Metabolic Panel:  Recent Labs Lab 07/07/17 0335 07/08/17 0229 07/12/17 0017 07/12/17 0034 07/13/17 0547  NA 135 133* 135 134* 138  K 4.2 4.4 4.4 4.3 4.6  CL 101 99* 100* 101 106  CO2 25 26  --  25 22  GLUCOSE 169* 164* 153* 147* 137*  BUN 41* 42* 49* 52* 45*  CREATININE 2.26* 2.43* 2.50* 2.44* 2.17*  CALCIUM 8.6* 8.7*  --  8.7* 8.7*  MG 1.8 1.8  --   --   --    GFR: Estimated Creatinine Clearance: 26.5 mL/min (A) (by C-G formula based on SCr of 2.17 mg/dL (H)). Liver Function Tests: No results for input(s): AST, ALT, ALKPHOS,  BILITOT, PROT, ALBUMIN in the last 168 hours. No results for input(s): LIPASE, AMYLASE in the last 168 hours. No results for input(s): AMMONIA in the last 168 hours. Coagulation Profile:  Recent Labs Lab 07/12/17 0034  INR 1.07   Cardiac Enzymes: No results for input(s): CKTOTAL, CKMB, CKMBINDEX, TROPONINI in the last 168 hours. BNP (last 3 results) No results for input(s): PROBNP in the last 8760 hours. HbA1C: No results for input(s): HGBA1C in the last 72 hours. CBG:  Recent Labs Lab 07/12/17 1145 07/12/17 1630 07/12/17 2132 07/13/17 0755 07/13/17 1147  GLUCAP 307* 197* 130* 125* 277*   Lipid Profile: No results for input(s): CHOL, HDL, LDLCALC, TRIG, CHOLHDL, LDLDIRECT in the last 72 hours. Thyroid Function Tests: No results for input(s): TSH, T4TOTAL, FREET4, T3FREE, THYROIDAB in the last 72 hours. Anemia Panel: No results for input(s): VITAMINB12, FOLATE, FERRITIN,  TIBC, IRON, RETICCTPCT in the last 72 hours. Sepsis Labs: No results for input(s): PROCALCITON, LATICACIDVEN in the last 168 hours.  Recent Results (from the past 240 hour(s))  Urine culture     Status: None   Collection Time: 07/11/17  2:44 AM  Result Value Ref Range Status   Specimen Description URINE, CATHETERIZED  Final   Special Requests NONE  Final   Culture   Final    NO GROWTH Performed at [facility], [address] [place], [place] [address]    Report Status 07/12/2017 FINAL  Final     Radiology Studies: No results found.  Scheduled Meds: . amLODipine  10 mg Oral Daily  . atorvastatin  20 mg Oral q1800  . docusate sodium  100 mg Oral BID  . feeding supplement (ENSURE ENLIVE)  237 mL Oral BID BM  . hydrALAZINE  50 mg Oral Q6H  . insulin aspart  0-9 Units Subcutaneous TID WC  . insulin glargine  14 Units Subcutaneous Q2200  . isosorbide mononitrate  120 mg Oral Daily  . magnesium oxide  400 mg Oral QPM  . pantoprazole  40 mg Oral QODAY  . ranolazine  500 mg Oral BID    Continuous Infusions: . sodium chloride    . sodium chloride 50 mL/hr at 07/12/17 2217     LOS: 1 day   Leeanna Slaby, Orpah Melter, MD [facility] Pager [phone]  If 7PM-7AM, please contact night-coverage [email or web address] Password [facility] 07/13/2017, 4:38 PM

## 2017-07-14 ENCOUNTER — Ambulatory Visit: Payer: Medicare Other

## 2017-07-14 ENCOUNTER — Other Ambulatory Visit: Payer: Medicare Other

## 2017-07-14 ENCOUNTER — Ambulatory Visit
Admit: 2017-07-14 | Discharge: 2017-07-14 | Disposition: A | Discharge status: Home / Self Care | Attending: Radiation Oncology | Admitting: Radiation Oncology

## 2017-07-14 LAB — TYPE AND SCREEN
ABO/RH(D): O POS
Antibody Screen: NEGATIVE
UNIT DIVISION: 0
Unit division: 0

## 2017-07-14 LAB — BPAM RBC
BLOOD PRODUCT EXPIRATION DATE: 201810152359
Blood Product Expiration Date: 201810142359
ISSUE DATE / TIME: 201809250504
Unit Type and Rh: 5100
Unit Type and Rh: 5100

## 2017-07-14 LAB — GLUCOSE, CAPILLARY
Glucose-Capillary: 165 mg/dL — ABNORMAL HIGH (ref 65–99)
Glucose-Capillary: 222 mg/dL — ABNORMAL HIGH (ref 65–99)

## 2017-07-14 NOTE — Care Management Note (Signed)
Case Management Note  Patient Details  Name: BRAZEN DOMANGUE MRN: [id number] Date of Birth: 1941-02-12  Subjective/Objective: Spoke to patient/spouse, & [facility] rep-Cheri-they are all agreeable to d/c after xrt today,& drive directly to [facility] for 2-3 days bladder irrigations-facility has set up for bladder irrigations. No further CM needs.                   Action/Plan:d/c to hospice medical facility.   Expected Discharge Date:  07/14/17               Expected Discharge Plan:  [place]  In-House Referral:     Discharge planning Services  CM Consult  Post Acute Care Choice:    Choice offered to:     DME Arranged:    DME Agency:     HH Arranged:    HH Agency:     Status of Service:  Completed, signed off  If discussed at [employer], dates discussed:    Additional Comments:  Dessa Phi, RN 07/14/2017, 12:42 PM

## 2017-07-14 NOTE — Consult Note (Signed)
[facility]: TC to [name] and also left voice mail with CM Juliann Pulse making sure all are on same plan for d/c today after pt's radiation treatment. Hospice home at [facility] able hs equipment to do CBI. Wife plans to arrive at [facility] after pt's discharge today and radiation to continue at least 2 days of CBI with nurse flushing manually PRN as needed etc. Supplies and all equipment in place and pt ready to be received. Thank you for your cooperation in helping manage this pt's symptoms and hopefully with plan in place pt will be more comfortable and not need to return to hospital. Webb Silversmith RN [phone]

## 2017-07-15 ENCOUNTER — Encounter: Payer: Self-pay | Admitting: Radiation Oncology

## 2017-07-15 ENCOUNTER — Ambulatory Visit
Admission: RE | Admit: 2017-07-15 | Discharge: 2017-07-15 | Disposition: A | Discharge status: Home / Self Care | Source: Ambulatory Visit | Attending: Radiation Oncology | Admitting: Radiation Oncology

## 2017-07-15 DIAGNOSIS — C679 Malignant neoplasm of bladder, unspecified: Secondary | ICD-10-CM | POA: Diagnosis not present

## 2017-07-15 DIAGNOSIS — Z510 Encounter for antineoplastic radiation therapy: Secondary | ICD-10-CM | POA: Diagnosis present

## 2017-07-15 DIAGNOSIS — R319 Hematuria, unspecified: Secondary | ICD-10-CM | POA: Diagnosis not present

## 2017-07-15 NOTE — Progress Notes (Signed)
°  Radiation Oncology         [phone]) [phone] ________________________________  Name: [name] MRN: [id number]  Date: 07/15/2017  DOB: Mar 02, 1941  End of Treatment Note  Diagnosis:   Hematuria from metastatic bladder cancer     Indication for treatment:  Palliative        Radiation treatment dates:   07/12/17 - 07/15/17  Site/dose:   Bladder/ 5Gy X 4 fractions  Beams/energy:   Harvie Junior Plan/ 15X  Narrative: The patient tolerated radiation treatment relatively well. Pt endorsed moderate fatigue. He had significant improvement in his gross hematuria throughout treatment.  Weight and vitals were stable throughout. He denied nausea, vomiting, diarrhea or constipation.  Plan: The patient has completed radiation treatment. The patient will return to radiation oncology clinic for routine followup in one month. I advised him to call or return sooner if he has any questions or concerns related to his recovery or treatment. ________________________________  [name]. [name], M.D.    This document serves as a record of services personally performed by David Pita, MD. It was created on his behalf by [name], a trained medical scribe. The creation of this record is based on the scribe's personal observations and the provider's statements to them. This document has been checked and approved by the attending provider.

## 2017-07-18 DIAGNOSIS — R0602 Shortness of breath: Secondary | ICD-10-CM | POA: Diagnosis not present

## 2017-07-21 ENCOUNTER — Ambulatory Visit: Payer: Medicare Other

## 2017-07-21 ENCOUNTER — Ambulatory Visit: Admitting: Radiation Oncology

## 2017-07-21 ENCOUNTER — Ambulatory Visit: Payer: Medicare Other | Admitting: Oncology

## 2017-07-21 ENCOUNTER — Other Ambulatory Visit: Payer: Medicare Other

## 2017-07-21 ENCOUNTER — Ambulatory Visit

## 2017-08-01 ENCOUNTER — Ambulatory Visit: Payer: Medicare Other | Admitting: Interventional Cardiology

## 2017-08-02 ENCOUNTER — Telehealth: Payer: Self-pay | Admitting: Oncology

## 2017-08-02 NOTE — Telephone Encounter (Signed)
Patient called in to find out why he had an appointment

## 2017-08-04 ENCOUNTER — Telehealth: Payer: Self-pay | Admitting: *Deleted

## 2017-08-04 ENCOUNTER — Other Ambulatory Visit: Payer: Medicare Other

## 2017-08-04 ENCOUNTER — Ambulatory Visit: Payer: Medicare Other | Admitting: Oncology

## 2017-08-04 ENCOUNTER — Ambulatory Visit: Payer: Medicare Other

## 2017-08-04 NOTE — Telephone Encounter (Signed)
TC to patient in response to his call about whether he needs to keep today's appt with Mikey Bussing, NP.  Pt is now with hospice at home.  Spoke with pt and wife. He states he is doing ok and feels he does not need to come in today, that Hospice is managing his care well.  He and his wife are appreciative of the care he has received from Dr. Alen Blew. Feels better after receiving radiation therapy.  His wife stated they would call if need arose.

## 2017-08-23 ENCOUNTER — Ambulatory Visit: Admission: RE | Admit: 2017-08-23 | Source: Ambulatory Visit | Admitting: Urology

## 2017-09-14 ENCOUNTER — Ambulatory Visit: Payer: Medicare Other | Admitting: Family

## 2017-09-14 ENCOUNTER — Encounter (HOSPITAL_COMMUNITY): Payer: Medicare Other

## 2017-10-18 DEATH — deceased

## 2018-01-01 IMAGING — CT NM PET TUM IMG INITIAL (PI) SKULL BASE T - THIGH
8 series · 25 of 25 positions shown · non-contrast
Comparison: CT pelvis dated 04/06/2017

CLINICAL DATA: Initial treatment strategy for bladder cancer.

EXAM:
NUCLEAR MEDICINE PET SKULL BASE TO THIGH
TECHNIQUE: 8.14 mCi F-18 FDG was injected intravenously. Full-ring PET imaging
was performed from the skull base to thigh after the radiotracer. CT
data was obtained and used for attenuation correction and anatomic
localization.
FASTING BLOOD GLUCOSE:  Value: 127 mg/dl

[Series 3: pet sk_thigh ac · axial · 5.0mm · 4.07mm/px · z∈[-863,+45]mm · 4 of 228 slices shown]
[im 1/228]
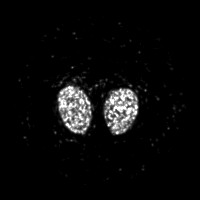
[im 76/228]
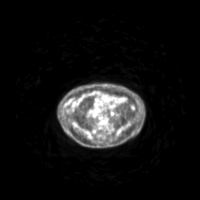
[im 152/228]
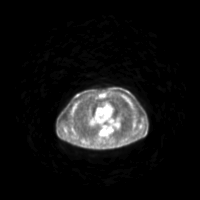
[im 228/228]
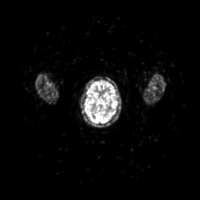

[Series 4: ct sk_thigh 5.0 b31f · axial · 5.0mm · 0.98mm/px · z∈[-863,+45]mm · 5 of 228 slices shown]
[im 1/228  brain]
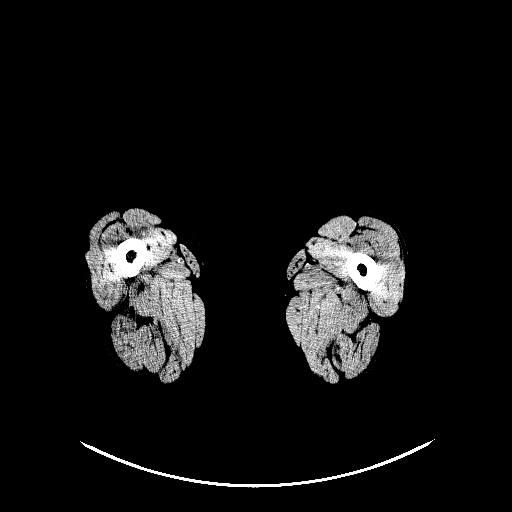
[im 57/228]
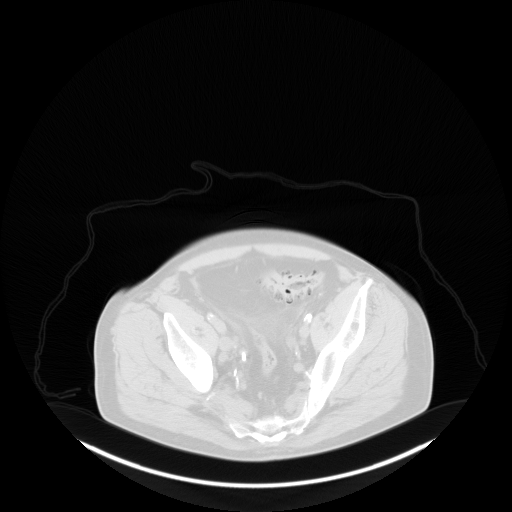
[im 114/228]
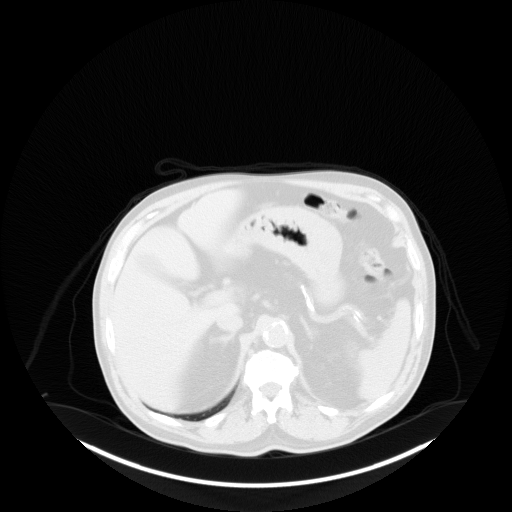
[im 171/228]
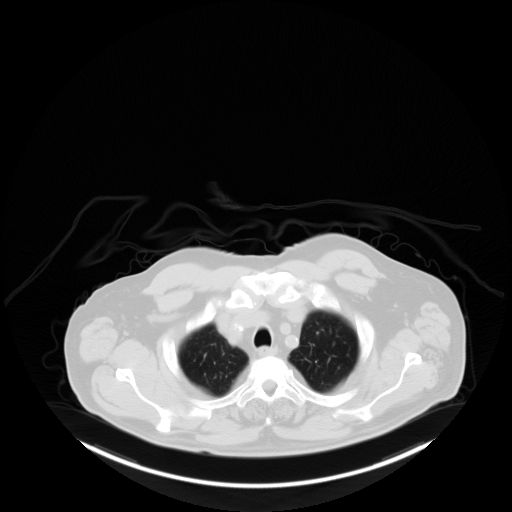
[im 228/228  brain]
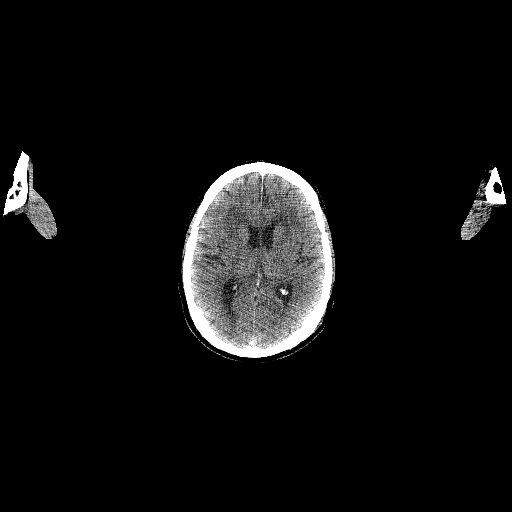

[Series 7: ct sk_thigh 5.0 b70f (id)_bone · axial · 5.0mm · 0.76mm/px · z∈[-415,-139]mm · 2 of 70 slices shown]
[im 1/70  bone]
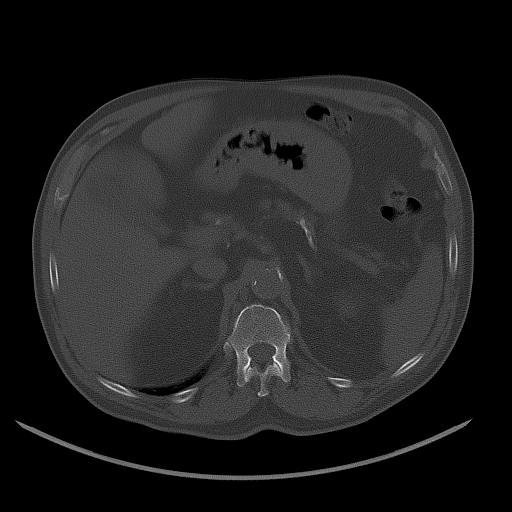
[im 70/70  bone]
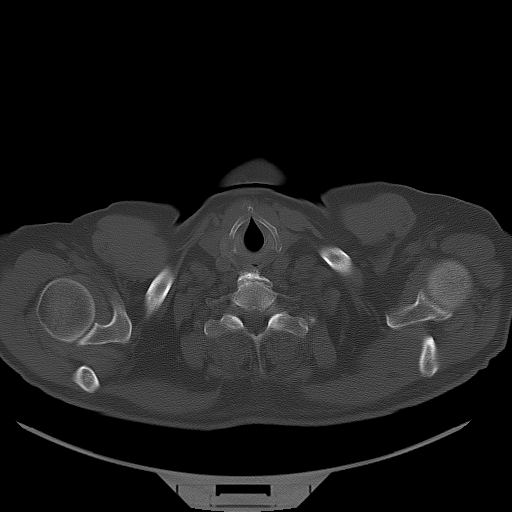

[Series 8: pet sk_thigh nac · axial · 5.0mm · 4.07mm/px · z∈[-863,+45]mm · 5 of 228 slices shown]
[im 1/228]
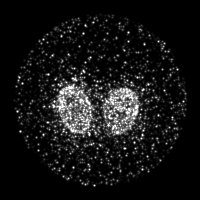
[im 57/228]
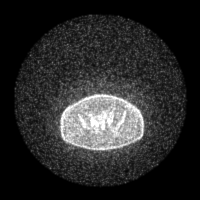
[im 114/228]
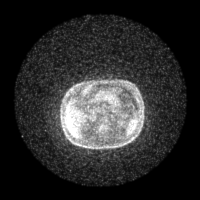
[im 171/228]
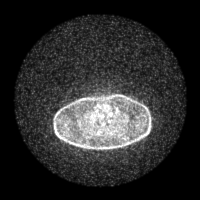
[im 228/228]
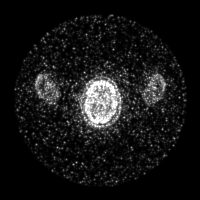

[Series 604: range-ct sk_thigh 5.0 (id)<alpha range> · 2 of 69 slices shown (1 of 2)]
[im 1/69]
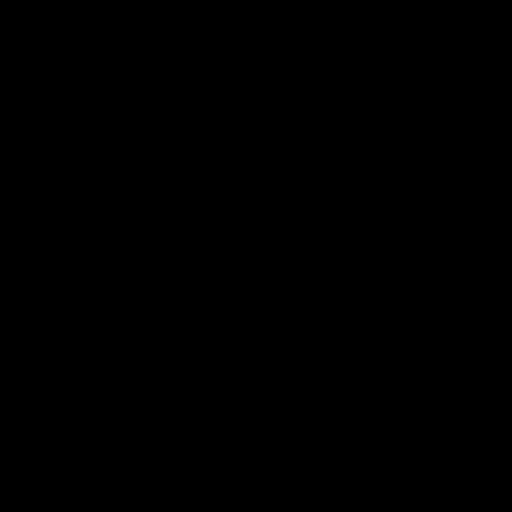
[im 69/69]
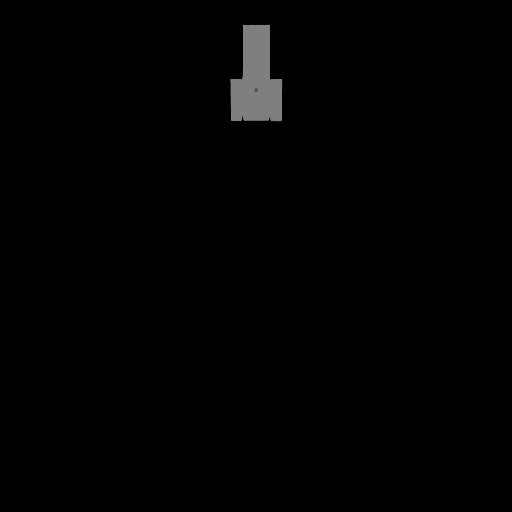

[Series 605: mip collection · coronal · 1.88mm/px · 1 of 32 slices shown]
[im 1/32]
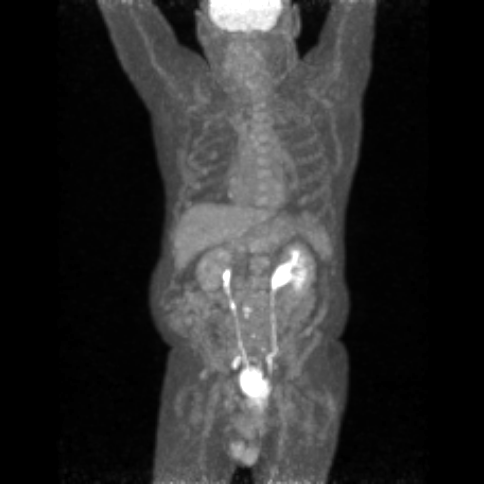

[Series 606: range-ct sk_thigh 5.0 (id)<alpha range> · 5 of 223 slices shown (2 of 2)]
[im 1/223]
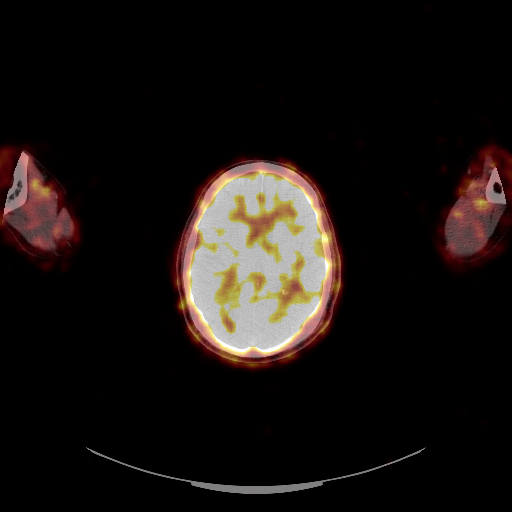
[im 56/223]
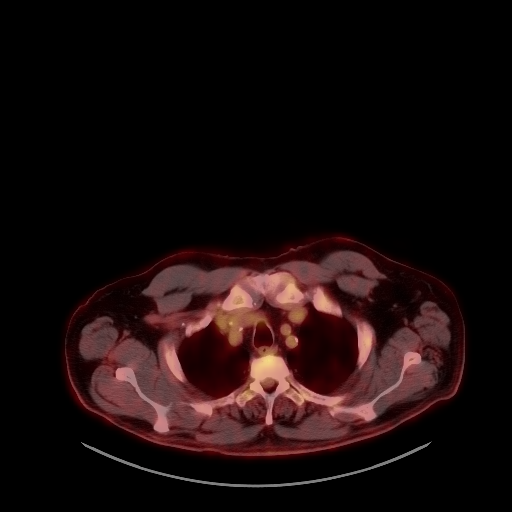
[im 112/223]
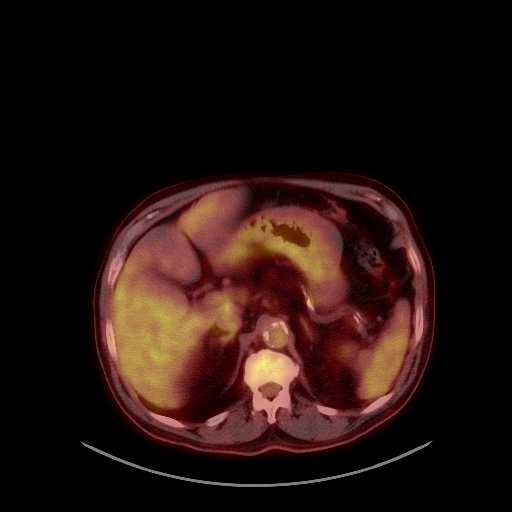
[im 167/223]
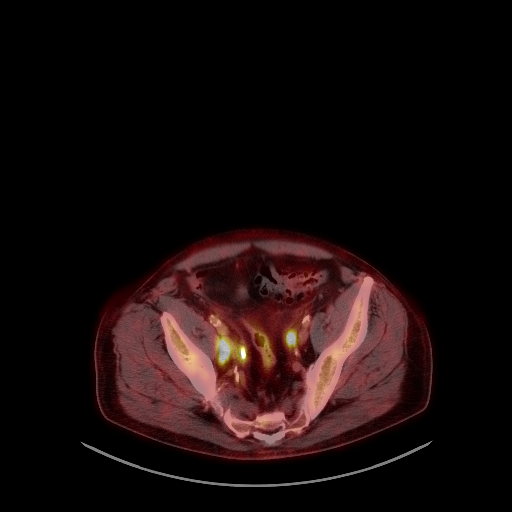
[im 223/223]
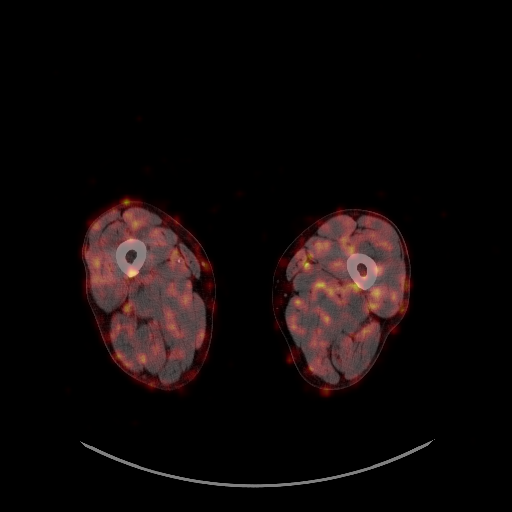

[Series 1032: results mm oncology reading · 1.00mm/px · 1 of 5 slices shown]
[im 1/5]
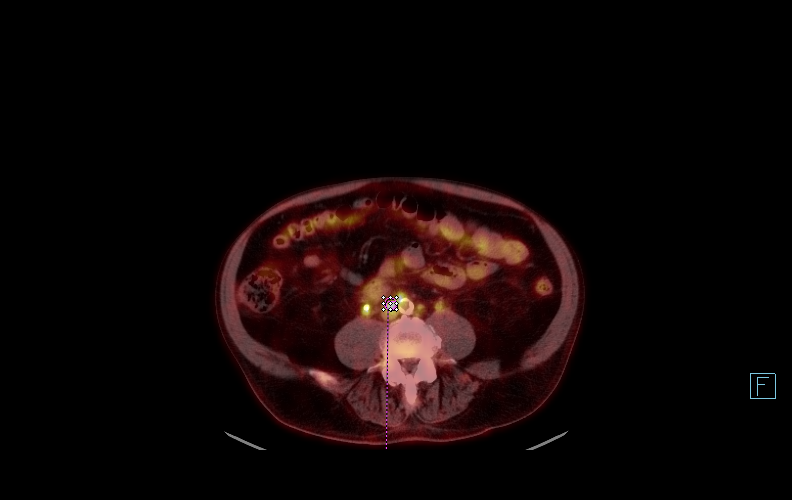

[25 of 25 positions shown; findings below may reference images not displayed]

FINDINGS: NECK: No hypermetabolic lymph nodes in the neck.

CHEST: No suspicious pulmonary nodules. No hypermetabolic thoracic
lymphadenopathy.

The heart is normal in size. Atherosclerotic calcifications of the
aortic arch. Postsurgical changes related to prior CABG. Median
sternotomy.

ABDOMEN/PELVIS: Mass-like thickening along the posterior bladder
base (series 4/ image 186), likely reflecting residual tumor,
although difficult to discretely measure.

Associated mild left hydroureteronephrosis. Indwelling right double
pigtail ureteral stent.

Associated hypermetabolic retroperitoneal/pelvic lymphadenopathy,
including:

--7 mm short axis aortocaval node (series 4/ image 147), max SUV

--9 mm left presacral node (series 4/image 166), max SUV

--12 mm short axis right pelvic sidewall node (series 4/image 173),
max SUV

SKELETON: No focal hypermetabolic activity to suggest skeletal
metastasis.
IMPRESSION: Mass-like thickening along the posterior bladder base, likely
reflecting residual tumor, although difficult to discretely measure.

Associated mild left hydroureteronephrosis. Indwelling right double
pigtail stent.

Associated small retroperitoneal and right pelvic nodal metastases,
as above.

## 2018-01-17 NOTE — Telephone Encounter (Signed)
Open in error to sign encounter 

## 2018-11-23 ENCOUNTER — Other Ambulatory Visit: Payer: Self-pay | Admitting: Nurse Practitioner
